# Patient Record
Sex: Male | Born: 1935
Health system: Southern US, Community
[De-identification: ages and names within clinical notes are randomized; demographics above are authoritative.]

## PROBLEM LIST (undated history)

## (undated) DIAGNOSIS — E7800 Pure hypercholesterolemia, unspecified: Secondary | ICD-10-CM

## (undated) DIAGNOSIS — I639 Cerebral infarction, unspecified: Secondary | ICD-10-CM

## (undated) DIAGNOSIS — R29898 Other symptoms and signs involving the musculoskeletal system: Secondary | ICD-10-CM

## (undated) DIAGNOSIS — E785 Hyperlipidemia, unspecified: Secondary | ICD-10-CM

## (undated) DIAGNOSIS — Z86010 Personal history of colonic polyps: Secondary | ICD-10-CM

## (undated) HISTORY — DX: Cerebral infarction, unspecified: I63.9

## (undated) HISTORY — DX: Pure hypercholesterolemia, unspecified: E78.00

## (undated) HISTORY — DX: Other symptoms and signs involving the musculoskeletal system: R29.898

## (undated) HISTORY — DX: Hyperlipidemia, unspecified: E78.5

## (undated) HISTORY — DX: Personal history of colonic polyps: Z86.010

## (undated) HISTORY — PX: SHOULDER SURGERY: SHX246

---

## 2001-09-13 ENCOUNTER — Encounter: Admission: RE | Admit: 2001-09-13 | Discharge: 2001-09-13 | Payer: Self-pay | Admitting: Family Medicine

## 2001-09-13 ENCOUNTER — Encounter: Payer: Self-pay | Admitting: Family Medicine

## 2001-09-15 ENCOUNTER — Encounter: Admission: RE | Admit: 2001-09-15 | Discharge: 2001-09-15 | Payer: Self-pay | Admitting: Family Medicine

## 2001-09-15 ENCOUNTER — Encounter: Payer: Self-pay | Admitting: Family Medicine

## 2004-02-08 ENCOUNTER — Ambulatory Visit (HOSPITAL_COMMUNITY): Admission: RE | Admit: 2004-02-08 | Discharge: 2004-02-08 | Payer: Self-pay | Admitting: Orthopedic Surgery

## 2004-02-08 ENCOUNTER — Ambulatory Visit (HOSPITAL_BASED_OUTPATIENT_CLINIC_OR_DEPARTMENT_OTHER): Admission: RE | Admit: 2004-02-08 | Discharge: 2004-02-08 | Payer: Self-pay | Admitting: Orthopedic Surgery

## 2005-05-18 ENCOUNTER — Encounter: Admission: RE | Admit: 2005-05-18 | Discharge: 2005-05-18 | Payer: Self-pay | Admitting: Family Medicine

## 2013-05-18 DIAGNOSIS — I639 Cerebral infarction, unspecified: Secondary | ICD-10-CM

## 2013-05-18 HISTORY — DX: Cerebral infarction, unspecified: I63.9

## 2013-05-27 ENCOUNTER — Other Ambulatory Visit: Payer: Self-pay | Admitting: Physician Assistant

## 2013-05-27 ENCOUNTER — Ambulatory Visit
Admission: RE | Admit: 2013-05-27 | Discharge: 2013-05-27 | Disposition: A | Discharge status: Home / Self Care | Payer: Medicare Other | Source: Ambulatory Visit | Attending: Physician Assistant | Admitting: Physician Assistant

## 2013-05-27 DIAGNOSIS — R531 Weakness: Secondary | ICD-10-CM

## 2013-06-04 ENCOUNTER — Other Ambulatory Visit: Payer: Self-pay | Admitting: Physician Assistant

## 2013-06-04 DIAGNOSIS — M5410 Radiculopathy, site unspecified: Secondary | ICD-10-CM

## 2013-06-13 ENCOUNTER — Other Ambulatory Visit: Payer: Medicare Other

## 2013-06-27 ENCOUNTER — Other Ambulatory Visit: Payer: Self-pay | Admitting: Neurosurgery

## 2013-06-27 DIAGNOSIS — R531 Weakness: Secondary | ICD-10-CM

## 2013-07-04 ENCOUNTER — Encounter (HOSPITAL_COMMUNITY): Payer: Self-pay | Admitting: Emergency Medicine

## 2013-07-04 ENCOUNTER — Observation Stay (HOSPITAL_COMMUNITY): Payer: Medicare Other

## 2013-07-04 ENCOUNTER — Emergency Department (HOSPITAL_COMMUNITY): Payer: Medicare Other

## 2013-07-04 ENCOUNTER — Observation Stay (HOSPITAL_COMMUNITY)
Admission: EM | Admit: 2013-07-04 | Discharge: 2013-07-06 | Disposition: A | Discharge status: Home / Self Care | Payer: Medicare Other | Attending: Internal Medicine | Admitting: Internal Medicine

## 2013-07-04 DIAGNOSIS — Z23 Encounter for immunization: Secondary | ICD-10-CM | POA: Insufficient documentation

## 2013-07-04 DIAGNOSIS — I639 Cerebral infarction, unspecified: Secondary | ICD-10-CM

## 2013-07-04 DIAGNOSIS — M47814 Spondylosis without myelopathy or radiculopathy, thoracic region: Secondary | ICD-10-CM | POA: Insufficient documentation

## 2013-07-04 DIAGNOSIS — I517 Cardiomegaly: Secondary | ICD-10-CM

## 2013-07-04 DIAGNOSIS — I10 Essential (primary) hypertension: Secondary | ICD-10-CM | POA: Insufficient documentation

## 2013-07-04 DIAGNOSIS — Z7982 Long term (current) use of aspirin: Secondary | ICD-10-CM | POA: Insufficient documentation

## 2013-07-04 DIAGNOSIS — M25519 Pain in unspecified shoulder: Secondary | ICD-10-CM | POA: Insufficient documentation

## 2013-07-04 DIAGNOSIS — J449 Chronic obstructive pulmonary disease, unspecified: Secondary | ICD-10-CM | POA: Insufficient documentation

## 2013-07-04 DIAGNOSIS — G8929 Other chronic pain: Secondary | ICD-10-CM | POA: Insufficient documentation

## 2013-07-04 DIAGNOSIS — J4489 Other specified chronic obstructive pulmonary disease: Secondary | ICD-10-CM | POA: Insufficient documentation

## 2013-07-04 DIAGNOSIS — E785 Hyperlipidemia, unspecified: Secondary | ICD-10-CM | POA: Insufficient documentation

## 2013-07-04 DIAGNOSIS — I6350 Cerebral infarction due to unspecified occlusion or stenosis of unspecified cerebral artery: Principal | ICD-10-CM | POA: Insufficient documentation

## 2013-07-04 DIAGNOSIS — M47812 Spondylosis without myelopathy or radiculopathy, cervical region: Secondary | ICD-10-CM | POA: Insufficient documentation

## 2013-07-04 DIAGNOSIS — Z87891 Personal history of nicotine dependence: Secondary | ICD-10-CM | POA: Insufficient documentation

## 2013-07-04 LAB — CBC
HCT: 44.7 % (ref 39.0–52.0)
HEMOGLOBIN: 15.3 g/dL (ref 13.0–17.0)
MCH: 33 pg (ref 26.0–34.0)
MCHC: 34.2 g/dL (ref 30.0–36.0)
MCV: 96.5 fL (ref 78.0–100.0)
PLATELETS: 180 10*3/uL (ref 150–400)
RBC: 4.63 MIL/uL (ref 4.22–5.81)
RDW: 13.4 % (ref 11.5–15.5)
WBC: 6.7 10*3/uL (ref 4.0–10.5)

## 2013-07-04 LAB — COMPREHENSIVE METABOLIC PANEL
ALK PHOS: 105 U/L (ref 39–117)
ALT: 18 U/L (ref 0–53)
AST: 17 U/L (ref 0–37)
Albumin: 3.6 g/dL (ref 3.5–5.2)
BILIRUBIN TOTAL: 0.4 mg/dL (ref 0.3–1.2)
BUN: 21 mg/dL (ref 6–23)
CHLORIDE: 103 meq/L (ref 96–112)
CO2: 26 mEq/L (ref 19–32)
Calcium: 9.3 mg/dL (ref 8.4–10.5)
Creatinine, Ser: 1.2 mg/dL (ref 0.50–1.35)
GFR calc Af Amer: 66 mL/min — ABNORMAL LOW (ref >90)
GFR calc non Af Amer: 57 mL/min — ABNORMAL LOW (ref >90)
Glucose, Bld: 94 mg/dL (ref 70–99)
POTASSIUM: 4.1 meq/L (ref 3.7–5.3)
SODIUM: 142 meq/L (ref 137–147)
TOTAL PROTEIN: 7.2 g/dL (ref 6.0–8.3)

## 2013-07-04 LAB — PROTIME-INR
INR: 1.05 (ref 0.00–1.49)
Prothrombin Time: 13.5 seconds (ref 11.6–15.2)

## 2013-07-04 LAB — DIFFERENTIAL
Basophils Absolute: 0 10*3/uL (ref 0.0–0.1)
Basophils Relative: 0 % (ref 0–1)
Eosinophils Absolute: 0.1 10*3/uL (ref 0.0–0.7)
Eosinophils Relative: 2 % (ref 0–5)
LYMPHS ABS: 1.6 10*3/uL (ref 0.7–4.0)
Lymphocytes Relative: 24 % (ref 12–46)
MONOS PCT: 12 % (ref 3–12)
Monocytes Absolute: 0.8 10*3/uL (ref 0.1–1.0)
NEUTROS ABS: 4.2 10*3/uL (ref 1.7–7.7)
NEUTROS PCT: 62 % (ref 43–77)

## 2013-07-04 LAB — I-STAT TROPONIN, ED: TROPONIN I, POC: 0 ng/mL (ref 0.00–0.08)

## 2013-07-04 LAB — APTT: APTT: 30 s (ref 24–37)

## 2013-07-04 MED ORDER — LORAZEPAM 2 MG/ML IJ SOLN
1.0000 mg | Freq: Once | INTRAMUSCULAR | Status: AC
  Administered 2013-07-05: 1 mg via INTRAVENOUS
  Filled 2013-07-04: qty 1

## 2013-07-04 MED ORDER — PERFLUTREN LIPID MICROSPHERE
1.0000 mL | INTRAVENOUS | Status: AC | PRN
  Filled 2013-07-04: qty 10

## 2013-07-04 MED ORDER — ENOXAPARIN SODIUM 40 MG/0.4ML ~~LOC~~ SOLN
40.0000 mg | SUBCUTANEOUS | Status: DC
  Administered 2013-07-04 – 2013-07-05 (×2): 40 mg via SUBCUTANEOUS
  Filled 2013-07-04 (×3): qty 0.4

## 2013-07-04 MED ORDER — ASPIRIN 325 MG PO TABS
325.0000 mg | ORAL_TABLET | Freq: Every day | ORAL | Status: DC
  Administered 2013-07-04 – 2013-07-06 (×3): 325 mg via ORAL
  Filled 2013-07-04 (×3): qty 1

## 2013-07-04 MED ORDER — PERFLUTREN LIPID MICROSPHERE
1.0000 mL | INTRAVENOUS | Status: AC | PRN
  Administered 2013-07-04: 2 mL via INTRAVENOUS
  Filled 2013-07-04: qty 10

## 2013-07-04 MED ORDER — ASPIRIN 300 MG RE SUPP
300.0000 mg | Freq: Every day | RECTAL | Status: DC
  Filled 2013-07-04 (×2): qty 1

## 2013-07-04 MED ORDER — ACETAMINOPHEN 325 MG PO TABS: 650.0000 mg | ORAL_TABLET | ORAL | Status: DC | PRN

## 2013-07-04 MED ORDER — SODIUM CHLORIDE 0.9 % IV SOLN
INTRAVENOUS | Status: DC
  Administered 2013-07-04: 500 mL via INTRAVENOUS

## 2013-07-04 MED ORDER — ACETAMINOPHEN 650 MG RE SUPP: 650.0000 mg | RECTAL | Status: DC | PRN

## 2013-07-04 MED ORDER — PNEUMOCOCCAL VAC POLYVALENT 25 MCG/0.5ML IJ INJ
0.5000 mL | INJECTION | INTRAMUSCULAR | Status: AC
  Administered 2013-07-06: 0.5 mL via INTRAMUSCULAR
  Filled 2013-07-04 (×2): qty 0.5

## 2013-07-04 MED ORDER — SENNOSIDES-DOCUSATE SODIUM 8.6-50 MG PO TABS
1.0000 | ORAL_TABLET | Freq: Every evening | ORAL | Status: DC | PRN
Start: 2013-07-04 — End: 2013-07-06

## 2013-07-04 NOTE — ED Notes (Signed)
Internal medicine MD at bedside for admission consult.

## 2013-07-04 NOTE — ED Notes (Signed)
Family at bedside. 

## 2013-07-04 NOTE — ED Notes (Signed)
Patient transported to CT 

## 2013-07-04 NOTE — Progress Notes (Signed)
Echocardiogram 2D Echocardiogram with Definity has been performed.  Ines Bloomer 07/04/2013, 4:34 PM

## 2013-07-04 NOTE — ED Notes (Signed)
BS 82 

## 2013-07-04 NOTE — ED Notes (Signed)
Pt has been having left sided weakness and loss of function in left hand since end of March. There have been a few episodes since but have resolved. Currently pt having no symptoms. sts was sent here by his doctor with positive MRI results saying acute infarct. MRI was done Wednesday.

## 2013-07-04 NOTE — ED Notes (Signed)
Patient back fro CT.

## 2013-07-04 NOTE — ED Notes (Signed)
The patient's wife said he started having numbness, tingling and could not use his fingers.  All this started on March 9th.  He went to his PCP and they gave him prednisone and he got better.  By the end of that week he got worse than before the 9th,  He could not pull his underwear up.  He went back, got an MRI and was schedule to have surgery.    Dr. Sheryle Spray advised them that his problems were not due to DJD they were du to changes in his brain.  He got a second MRI and before he could see Dr. Maxie Barb she sent him here to the ED to be evaluated due to "sub acute or acute infarcts and circulatory" problems.

## 2013-07-04 NOTE — Consult Note (Signed)
Referring Physician: Alvino Chapel    Chief Complaint: Left hand weakness since March 9  HPI:                                                                                                                                         Edward Maynard is an 80 y.o. male who has been having left hand weakness since March 9th. He went to see his primary care MD who gave him Prednisone.  He states over the week he took Prednisone his left hand slowly improved. After his last prednisone pill he noted a significant decline in his left hand strength.  Wife noted he could not grip and was clumsy with his left hand.  Over a period of a week his hand strength slightly improved but then worsened. He obtained MRI C-spine which was normal but he was referred to neurosurgery.  Neurosurgery ordered MRI brain which was obtained on Wednesday.  This showed bilateral Infarct in watershed area. He was to see Dr. Maxie Barb out patient neruology but Dr. Maxie Barb sent him to ED. Currently he shows no weakness in his left hand.   Date last known well: Date: 05/26/2013 Time last known well: Unable to determine tPA Given: No: out of window  No pertinent past medical history.  History reviewed. No pertinent past surgical history.  Family History  Problem Relation Age of Onset  . Hypertension Mother   . Hypertension Father    Social History:  reports that he has quit smoking. He does not have any smokeless tobacco history on file. He reports that he does not drink alcohol. His drug history is not on file.  Allergies: No Known Allergies  Medications:                                                                                                                           No current facility-administered medications for this encounter.   Current Outpatient Prescriptions  Medication Sig Dispense Refill  . aspirin EC 81 MG tablet Take 81 mg by mouth daily.      Marland Kitchen etodolac (LODINE) 400 MG tablet Take 400 mg by mouth daily as needed (knee  pain).         ROS:  History obtained from the patient  General ROS: negative for - chills, fatigue, fever, night sweats, weight gain or weight loss Psychological ROS: negative for - behavioral disorder, hallucinations, memory difficulties, mood swings or suicidal ideation Ophthalmic ROS: negative for - blurry vision, double vision, eye pain or loss of vision ENT ROS: negative for - epistaxis, nasal discharge, oral lesions, sore throat, tinnitus or vertigo Allergy and Immunology ROS: negative for - hives or itchy/watery eyes Hematological and Lymphatic ROS: negative for - bleeding problems, bruising or swollen lymph nodes Endocrine ROS: negative for - galactorrhea, hair pattern changes, polydipsia/polyuria or temperature intolerance Respiratory ROS: negative for - cough, hemoptysis, shortness of breath or wheezing Cardiovascular ROS: negative for - chest pain, dyspnea on exertion, edema or irregular heartbeat Gastrointestinal ROS: negative for - abdominal pain, diarrhea, hematemesis, nausea/vomiting or stool incontinence Genito-Urinary ROS: negative for - dysuria, hematuria, incontinence or urinary frequency/urgency Musculoskeletal ROS: negative for - joint swelling or muscular weakness Neurological ROS: as noted in HPI Dermatological ROS: negative for rash and skin lesion changes  Neurologic Examination:                                                                                                      Blood pressure 157/86, pulse 76, temperature 98 F (36.7 C), temperature source Oral, resp. rate 20, height 6' (1.829 m), weight 93.441 kg (206 lb), SpO2 96.00%.   Mental Status: Alert, oriented, thought content appropriate.  Speech fluent without evidence of aphasia.  Able to follow 3 step commands without difficulty. Cranial Nerves: II: Discs flat  bilaterally; Visual fields grossly normal, pupils equal, round, reactive to light and accommodation III,IV, VI: ptosis not present, extra-ocular motions intact bilaterally V,VII: smile symmetric, facial light touch sensation normal bilaterally VIII: hearing normal bilaterally IX,X: gag reflex present XI: bilateral shoulder shrug XII: midline tongue extension without atrophy or fasciculations  Motor: Right : Upper extremity   5/5    Left:     Upper extremity   5/5  Lower extremity   5/5     Lower extremity   5/5 Tone and bulk:normal tone throughout; no atrophy noted Sensory: Pinprick and light touch intact throughout, bilaterally Deep Tendon Reflexes:  Right: Upper Extremity   Left: Upper extremity   biceps (C-5 to C-6) 2/4   biceps (C-5 to C-6) 2/4 tricep (C7) 2/4    triceps (C7) 2/4 Brachioradialis (C6) 2/4  Brachioradialis (C6) 2/4  Lower Extremity Lower Extremity  quadriceps (L-2 to L-4) 2/4   quadriceps (L-2 to L-4) 2/4 Achilles (S1) 1/4   Achilles (S1) 1/4  Plantars: Right: downgoing   Left: downgoing Cerebellar: normal finger-to-nose,  normal heel-to-shin test Gait: not tested due to multiple leads.  CV: pulses palpable throughout    Lab Results: Basic Metabolic Panel:  Recent Labs Lab 07/04/13 1146  NA 142  K 4.1  CL 103  CO2 26  GLUCOSE 94  BUN 21  CREATININE 1.20  CALCIUM 9.3    Liver Function Tests:  Recent Labs Lab 07/04/13 1146  AST 17  ALT 18  ALKPHOS 105  BILITOT 0.4  PROT 7.2  ALBUMIN 3.6   No results found for this basename: LIPASE, AMYLASE,  in the last 168 hours No results found for this basename: AMMONIA,  in the last 168 hours  CBC:  Recent Labs Lab 07/04/13 1146  WBC 6.7  NEUTROABS 4.2  HGB 15.3  HCT 44.7  MCV 96.5  PLT 180    Cardiac Enzymes: No results found for this basename: CKTOTAL, CKMB, CKMBINDEX, TROPONINI,  in the last 168 hours  Lipid Panel: No results found for this basename: CHOL, TRIG, HDL, CHOLHDL,  VLDL, LDLCALC,  in the last 168 hours  CBG: No results found for this basename: GLUCAP,  in the last 168 hours  Microbiology: No results found for this or any previous visit.  Coagulation Studies:  Recent Labs  07/04/13 1146  LABPROT 13.5  INR 1.05    Imaging: No results found.     Assessment and plan discussed with with attending physician and they are in agreement.    Etta Quill PA-C [facility] [phone]  07/04/2013, 1:14 PM  I have seen and evaluated the patient. I have reviewed the above note and made appropriate changes.    Assessment: 80 y.o. male with left hand weakness that has waxed and waned since March 9th.  MRI shows bilateral infarcts. Patient is on ASA but admits he does not take this on a regular basis.   Stroke Risk Factors - none    Plan: 1. HgbA1c, fasting lipid panel 2. MRA  of the brain without contrast 3. PT consult, OT consult, Speech consult 4. Echocardiogram 5. Carotid dopplers 6. Prophylactic therapy-Antiplatelet med: Aspirin - dose 81 mg daily 7. Risk  Factor modification  Roland Rack, MD [facility] [phone]  If 7pm- 7am, please page neurology on call as listed in [place].

## 2013-07-04 NOTE — ED Provider Notes (Signed)
CSN: [id number]     Arrival date & time 07/04/13  1004 History   First MD Initiated Contact with Patient 07/04/13 1128     Chief Complaint  Patient presents with  . Cerebrovascular Accident     (Consider location/radiation/quality/duration/timing/severity/associated sxs/prior Treatment) Patient is a 80 y.o. male presenting with Acute Neurological Problem. The history is provided by the patient.  Cerebrovascular Accident This is a new problem. Pertinent negatives include no chest pain, no abdominal pain, no headaches and no shortness of breath.   patient presents with an MRI showing a stroke. He has had some left hand weakness, worse on left hand. It is currently gone. It began around 5 weeks ago. He has had an MRI of the cervical spine and see neurosurgery. Neurosurgery ordered a brain MRI after telling him that this was not coming from the spine. Patient has had trouble with his golf game. No headaches. No numbness or weakness in the legs. No chest pain. No history of strokes. He has had some dizziness occasionally when he stands. History reviewed. No pertinent past medical history. Past Surgical History  Procedure Laterality Date  . Shoulder surgery Bilateral    Family History  Problem Relation Age of Onset  . Hypertension Mother   . Hypertension Father   . COPD Brother    History  Substance Use Topics  . Smoking status: Former Research scientist (life sciences)  . Smokeless tobacco: Not on file  . Alcohol Use: No    Review of Systems  Constitutional: Negative for activity change and appetite change.  Eyes: Negative for pain.  Respiratory: Negative for chest tightness and shortness of breath.   Cardiovascular: Negative for chest pain and leg swelling.  Gastrointestinal: Negative for nausea, vomiting, abdominal pain and diarrhea.  Genitourinary: Negative for flank pain.  Musculoskeletal: Negative for back pain and neck stiffness.  Skin: Negative for rash.  Neurological: Positive for weakness. Negative  for numbness and headaches.  Psychiatric/Behavioral: Negative for behavioral problems.   and     Allergies  Review of patient's allergies indicates no known allergies.  Home Medications   Prior to Admission medications   Medication Sig Start Date End Date Taking? Authorizing Provider  aspirin EC 81 MG tablet Take 81 mg by mouth daily.   Yes Historical Provider, MD  etodolac (LODINE) 400 MG tablet Take 400 mg by mouth daily as needed (knee pain).   Yes Historical Provider, MD   BP 132/69  Pulse 67  Temp(Src) 97.5 F (36.4 C) (Oral)  Resp 18  Ht 6' (1.829 m)  Wt 206 lb (93.441 kg)  BMI 27.93 kg/m2  SpO2 96% Physical Exam  Nursing note and vitals reviewed. Constitutional: He is oriented to person, place, and time. He appears well-developed and well-nourished.  HENT:  Head: Normocephalic and atraumatic.  Eyes: EOM are normal. Pupils are equal, round, and reactive to light.  Neck: Normal range of motion. Neck supple.  Cardiovascular: Normal rate, regular rhythm and normal heart sounds.   No murmur heard. Pulmonary/Chest: Effort normal and breath sounds normal.  Abdominal: Soft. Bowel sounds are normal. He exhibits no distension and no mass. There is no tenderness. There is no rebound and no guarding.  Musculoskeletal: Normal range of motion. He exhibits no edema.  Neurological: He is alert and oriented to person, place, and time. No cranial nerve deficit.  Good grip strength bilaterally. Finger-nose intact bilaterally. Sensation intact over bilateral upper and lower extremities. Face is symmetric.  Skin: Skin is warm and dry.  Psychiatric:  He has a normal mood and affect.    ED Course  Procedures (including critical care time) Labs Review Labs Reviewed  COMPREHENSIVE METABOLIC PANEL - Abnormal; Notable for the following:    GFR calc non Af Amer 57 (*)    GFR calc Af Amer 66 (*)    All other components within normal limits  HEMOGLOBIN A1C - Abnormal; Notable for the  following:    Hemoglobin A1C 5.8 (*)    Mean Plasma Glucose 120 (*)    All other components within normal limits  LIPID PANEL - Abnormal; Notable for the following:    Triglycerides 194 (*)    HDL 36 (*)    LDL Cholesterol 115 (*)    All other components within normal limits  PROTIME-INR  APTT  CBC  DIFFERENTIAL  CBG MONITORING, ED  Randolm Idol, ED    Imaging Review Dg Chest 2 View  07/04/2013   CLINICAL DATA:  Stroke.  EXAM: CHEST  2 VIEW  COMPARISON:  None  FINDINGS: Elevated right hemidiaphragm. Mild passive atelectasis along the right hemidiaphragm.  Thoracic spondylosis. Cardiac and mediastinal margins appear normal. No pleural effusion identified.  IMPRESSION: 1. Mildly elevated right hemidiaphragm with associated passive atelectasis. 2. Thoracic spondylosis.   Electronically Signed   By: Sherryl Barters M.D.   On: 07/04/2013 18:26   Mr Jodene Nam Head/brain Wo Cm  07/05/2013   CLINICAL DATA:  80 year old male with acute and subacute infarcts discovered on 07/02/2013 brain MRI. Upper extremity numbness and weakness. Initial encounter.  EXAM: MRA HEAD WITHOUT CONTRAST  TECHNIQUE: Angiographic images of the Circle of Willis were obtained using MRA technique without intravenous contrast.  COMPARISON:  Brain MRI 07/02/2013.  FINDINGS: Antegrade flow in the posterior circulation. Dominant distal right vertebral artery. Normal left PICA origin. Patent vertebrobasilar junction. No basilar stenosis. AICA origins are patent. SCA and left PCA origins are normal. Fetal type right PCA origin. Left posterior communicating artery diminutive or absent. Normal bilateral PCA branches.  Antegrade flow in both ICA siphons. No ICA stenosis. Ophthalmic artery and right posterior communicating artery origins are within normal limits. Patent carotid termini.  Normal MCA and ACA origins. Diminutive or absent anterior communicating artery. No M1 segment stenosis. Motion artifact mildly degrading detail of the  bilateral MCA and ACA branches.Allowing for motion visualized bilateral MCA and ACA branches are within normal limits.  IMPRESSION: Negative intracranial MRA, allowing for mild motion artifact.   Electronically Signed   By: Lars Pinks M.D.   On: 07/05/2013 12:51     EKG Interpretation   Date/Time:  Friday July 04 2013 11:51:31 EDT Ventricular Rate:  76 PR Interval:  212 QRS Duration: 87 QT Interval:  387 QTC Calculation: 435 R Axis:   -7 Text Interpretation:  Sinus rhythm Borderline prolonged PR interval  Abnormal R-wave progression, early transition Baseline wander in lead(s)  V3 Confirmed by Alvino Chapel  MD, Ovid Curd [phone]) on 07/04/2013 1:15:44 PM      MDM   Final diagnoses:  Stroke    Patient with stroke. MRI done as an outpatient showed acute and subacute strokes. Symptoms are no longer present. After being seen by neurology patient be admitted to internal medicine for    [facility] R. Alvino Chapel, MD 07/05/13 1736

## 2013-07-04 NOTE — ED Notes (Signed)
MD at bedside, Neurology.

## 2013-07-04 NOTE — H&P (Signed)
History and Physical  Edward Maynard Edward Maynard DOB: 03/17/1934 DOA: 07/04/2013  Referring physician: EDP PCP: Simona Huh, MD  Outpatient Specialists:  1. Orthopedics  Chief Complaint: Left upper extremity weakness  HPI: Edward Maynard is a 80 y.o. male with no significant past medical history, history of bilateral shoulder surgery, physically active and plays golf, referred by neurologists office for evaluation of left upper extremity weakness and MRI which shows stroke. On 05/26/2013, patient experienced an onset of left upper extremity weakness, especially of his hand without any tingling, numbness, worsening neck pain or any other complaints. He was unable to use his left hand to do his activities of daily living. He was seen by his PCP and treated with a course of steroids for presumed cervical disc disease. His symptoms gradually improved over the next week. Soon after he stopped his prednisone, he again had weakness of left upper extremity on 06/02/2013 which was worse than the previous episode. He was unable to even put on his cloths or hold anything. With this he had some unsteadiness of gait and generally feeling unwell. No headache, slurred speech or facial asymmetry. PCPs office had patient MRI of cervical spine over the next several days and then he was referred to neurosurgery. Dr.Botero, neurosurgery evaluated him on 07/02/2013 and suspected his left upper extremity weakness was secondary to stroke rather than a cervical spine or neurosurgical issue. MRI brain was requested which revealed bilateral infarction watershed area. Patient had an appointment to see Dr. Maxie Barb, neurology on 07/07/2013. However patient was called by the neurologist office today and advised to come to the hospital for further evaluation. In the interim, patient states that his left upper extremity strength has returned almost to baseline.   Review of Systems: All systems reviewed and apart from history  of presenting illness, are negative. Chronic right shoulder pain for which she is awaiting to see the orthopedic M.D. again. This pain is worse on lying on that side.  History reviewed. No pertinent past medical history. Past Surgical History  Procedure Laterality Date  . Shoulder surgery Bilateral    Social History:  reports that he has quit smoking. He does not have any smokeless tobacco history on file. He reports that he does not drink alcohol or use illicit drugs. Married. Independent of activities of daily living.  No Known Allergies  Family History  Problem Relation Age of Onset  . Hypertension Mother   . Hypertension Father   . COPD Brother     Prior to Admission medications   Medication Sig Start Date End Date Taking? Authorizing Provider  aspirin EC 81 MG tablet Take 81 mg by mouth daily.   Yes Historical Provider, MD  etodolac (LODINE) 400 MG tablet Take 400 mg by mouth daily as needed (knee pain).   Yes Historical Provider, MD   Physical Exam: Filed Vitals:   07/04/13 1230 07/04/13 1245 07/04/13 1300 07/04/13 1349  BP: 142/69 151/80 157/86 156/90  Pulse: 68 73 76 77  Temp:      TempSrc:      Resp: 21 21 20 18   Height:      Weight:      SpO2: 96% 95% 96% 95%     General exam: Moderately built and nourished pleasant elderly patient, lying comfortably supine on the gurney in no obvious distress.  Head, eyes and ENT: Nontraumatic and normocephalic. Pupils equally reacting to light and accommodation. Oral mucosa moist.  Neck: Supple. No JVD, carotid bruit  or thyromegaly.  Lymphatics: No lymphadenopathy.  Respiratory system: Clear to auscultation. No increased work of breathing.  Cardiovascular system: S1 and S2 heard, RRR. No JVD, murmurs, gallops, clicks or pedal edema.  Gastrointestinal system: Abdomen is nondistended, soft and nontender. Normal bowel sounds heard. No organomegaly or masses appreciated.  Central nervous system: Alert and oriented. No focal  neurological deficits.  Extremities: Symmetric 5 x 5 power. Peripheral pulses symmetrically felt.   Skin: No rashes or acute findings.  Musculoskeletal system: Negative exam.  Psychiatry: Pleasant and cooperative.   Labs on Admission:  Basic Metabolic Panel:  Recent Labs Lab 07/04/13 1146  NA 142  K 4.1  CL 103  CO2 26  GLUCOSE 94  BUN 21  CREATININE 1.20  CALCIUM 9.3   Liver Function Tests:  Recent Labs Lab 07/04/13 1146  AST 17  ALT 18  ALKPHOS 105  BILITOT 0.4  PROT 7.2  ALBUMIN 3.6   No results found for this basename: LIPASE, AMYLASE,  in the last 168 hours No results found for this basename: AMMONIA,  in the last 168 hours CBC:  Recent Labs Lab 07/04/13 1146  WBC 6.7  NEUTROABS 4.2  HGB 15.3  HCT 44.7  MCV 96.5  PLT 180   Cardiac Enzymes: No results found for this basename: CKTOTAL, CKMB, CKMBINDEX, TROPONINI,  in the last 168 hours  BNP (last 3 results) No results found for this basename: PROBNP,  in the last 8760 hours CBG: No results found for this basename: GLUCAP,  in the last 168 hours  Radiological Exams on Admission: No results found.  EKG: Independently reviewed. Sinus rhythm without acute findings.  Assessment/Plan Principal Problem:   CVA (cerebral infarction)   80 year old male patient with no known past medical history, presents with 2 episodes of left upper extremity weakness since 05/26/2013. MRI brain shows bilateral infarcts in the watershed region. Patient took aspirin 81 mg infrequently.  1. CVA: Admit to telemetry.  Complete stroke workup-MRA brain without contrast, 2-D echo, carotid Dopplers, hemoglobin A1c, fasting lipids. PT, OT and ST consultation. Aspirin 325 mg daily for secondary stroke prevention. Neurology consultation appreciated.     Code Status:  Full  Family Communication:  discussed with spouse at bedside.   Disposition Plan:  home possibly 07/05/13 after stroke workup and if it isunremarkable.    Time spent:  55 minutes  Modena Jansky, MD, FACP, [facility]. [facility] Pager [phone]  If 7PM-7AM, please contact night-coverage [email or web address] Password [email or web address] 07/04/2013, 2:15 PM

## 2013-07-05 ENCOUNTER — Observation Stay (HOSPITAL_COMMUNITY): Payer: Medicare Other

## 2013-07-05 DIAGNOSIS — E785 Hyperlipidemia, unspecified: Secondary | ICD-10-CM

## 2013-07-05 LAB — LIPID PANEL
CHOL/HDL RATIO: 5.3 ratio
Cholesterol: 190 mg/dL (ref 0–200)
HDL: 36 mg/dL — ABNORMAL LOW (ref >39)
LDL CALC: 115 mg/dL — AB (ref 0–99)
TRIGLYCERIDES: 194 mg/dL — AB (ref <150)
VLDL: 39 mg/dL (ref 0–40)

## 2013-07-05 LAB — HEMOGLOBIN A1C
Hgb A1c MFr Bld: 5.8 % — ABNORMAL HIGH (ref <5.7)
Mean Plasma Glucose: 120 mg/dL — ABNORMAL HIGH (ref <117)

## 2013-07-05 MED ORDER — SIMVASTATIN 20 MG PO TABS
20.0000 mg | ORAL_TABLET | Freq: Every day | ORAL | Status: DC
  Administered 2013-07-05: 20 mg via ORAL
  Filled 2013-07-05 (×2): qty 1

## 2013-07-05 NOTE — Progress Notes (Signed)
PT Cancellation of evaluation Note  Patient Details Name: Edward Maynard MRN: [id number] DOB: [birth year]   Cancelled Treatment:    Reason Eval/Treat Not Completed: OT evaluated pt and screened for PT needs, no needs identified, will sign off from acute PT   Tessie Fass Ry Moody 07/05/2013, 12:44 PM 07/05/2013  Donnella Sham, [place] [phone]  (pager)

## 2013-07-05 NOTE — Progress Notes (Signed)
VASCULAR LAB PRELIMINARY  PRELIMINARY  PRELIMINARY  PRELIMINARY  Carotid Dopplers completed.    Preliminary report:  1-39% ICA stenosis.  Vertebral artery flow is antegrade.  Iantha Fallen, RVT 07/05/2013, 3:45 PM

## 2013-07-05 NOTE — Progress Notes (Signed)
[facility] PROGRESS NOTE  Edward Maynard:[id number] DOB: Dec 13, 1933 DOA: 07/04/2013 PCP: Edward Huh, MD  Assessment/Plan: 1. CVA 1. MRA head unremarkable 2. Carotid dopplers still pending 3. 2D echo unremarkable 4. No needs per OT 5. On statin and ASA per Neurology recs 2. HTN 1. Stable 2. Cont home meds 3. COPD 1. Stable on minimal O2 support 4. DVT prophylaxis 1. lovenox subQ  Code Status: Full Family Communication: Pt in room (indicate person spoken with, relationship, and if by phone, the number) Disposition Plan: Pending   Consultants:  Neurology  Procedures:    Antibiotics:    HPI/Subjective: No complaints. Eager to go home  Objective: Filed Vitals:   07/04/13 2300 07/05/13 0100 07/05/13 0223 07/05/13 0945  BP: 145/84 172/81 155/86 141/71  Pulse: 69 66 64 70  Temp: 98.6 F (37 C) 98.5 F (36.9 C)  97.3 F (36.3 C)  TempSrc: Oral Oral  Oral  Resp: 16 16  18   Height:      Weight:      SpO2: 97% 95% 96% 97%    Intake/Output Summary (Last 24 hours) at 07/05/13 1208 Last data filed at 07/04/13 1350  Gross per 24 hour  Intake      0 ml  Output    350 ml  Net   -350 ml   Filed Weights   07/04/13 1113 07/04/13 1153  Weight: 93.441 kg (206 lb) 93.441 kg (206 lb)    Exam:   General:  Awake, in nad  Cardiovascular: regular, s1, s2  Respiratory: normal resp effort, no wheezing  Abdomen: soft, nondistended  Musculoskeletal: perfused, no clubbing   Data Reviewed: Basic Metabolic Panel:  Recent Labs Lab 07/04/13 1146  NA 142  K 4.1  CL 103  CO2 26  GLUCOSE 94  BUN 21  CREATININE 1.20  CALCIUM 9.3   Liver Function Tests:  Recent Labs Lab 07/04/13 1146  AST 17  ALT 18  ALKPHOS 105  BILITOT 0.4  PROT 7.2  ALBUMIN 3.6   No results found for this basename: LIPASE, AMYLASE,  in the last 168 hours No results found for this basename: AMMONIA,  in the last 168 hours CBC:  Recent Labs Lab 07/04/13 1146   WBC 6.7  NEUTROABS 4.2  HGB 15.3  HCT 44.7  MCV 96.5  PLT 180   Cardiac Enzymes: No results found for this basename: CKTOTAL, CKMB, CKMBINDEX, TROPONINI,  in the last 168 hours BNP (last 3 results) No results found for this basename: PROBNP,  in the last 8760 hours CBG: No results found for this basename: GLUCAP,  in the last 168 hours  No results found for this or any previous visit (from the past 240 hour(s)).   Studies: Dg Chest 2 View  07/04/2013   CLINICAL DATA:  Stroke.  EXAM: CHEST  2 VIEW  COMPARISON:  None  FINDINGS: Elevated right hemidiaphragm. Mild passive atelectasis along the right hemidiaphragm.  Thoracic spondylosis. Cardiac and mediastinal margins appear normal. No pleural effusion identified.  IMPRESSION: 1. Mildly elevated right hemidiaphragm with associated passive atelectasis. 2. Thoracic spondylosis.   Electronically Signed   By: Sherryl Barters M.D.   On: 07/04/2013 18:26    Scheduled Meds: . aspirin  300 mg Rectal Daily   Or  . aspirin  325 mg Oral Daily  . enoxaparin (LOVENOX) injection  40 mg Subcutaneous Q24H  . pneumococcal 23 valent vaccine  0.5 mL Intramuscular Tomorrow-1000  . simvastatin  20 mg Oral q1800  Continuous Infusions: . sodium chloride 500 mL (07/04/13 1611)    Principal Problem:   CVA (cerebral infarction)  Time spent: 72min  Edward Maynard  [facility] Pager [phone]. If 7PM-7AM, please contact night-coverage at [email or web address], password [facility] 07/05/2013, 12:08 PM  LOS: 1 day

## 2013-07-05 NOTE — Progress Notes (Signed)
Stroke Team Progress Note  HISTORY Edward Maynard is a 80 y.o. male who had been having left hand weakness since March 9th. He went to see his primary care MD who gave him Prednisone. He stated over the week he took Prednisone his left hand slowly improved. After his last prednisone pill he noted a significant decline in his left hand strength. Wife noted he could not grip and was clumsy with his left hand. Over a period of a week his hand strength slightly improved but then worsened. He obtained MRI C-spine which was normal but he was referred to neurosurgery. Neurosurgery ( Dr. Joya Salm ) ordered MRI brain which was obtained on Wednesday. This showed bilateral Infarcts in watershed area. He was to see Dr. Maxie Barb out patient neruology but Dr. Maxie Barb sent him to ED. Currently he shows no weakness in his left hand.   Date last known well: Date: 05/26/2013  Time last known well: Unable to determine  tPA Given: No: out of window   SUBJECTIVE Multiple family members present. The patient's wife reports that the patient also had some intermittent balance problems.  OBJECTIVE Most recent Vital Signs: Filed Vitals:   07/04/13 2105 07/04/13 2300 07/05/13 0100 07/05/13 0223  BP: 148/87 145/84 172/81 155/86  Pulse: 64 69 66 64  Temp: 98 F (36.7 C) 98.6 F (37 C) 98.5 F (36.9 C)   TempSrc: Oral Oral Oral   Resp: 16 16 16    Height:      Weight:      SpO2: 93% 97% 95% 96%   CBG (last 3)  No results found for this basename: GLUCAP,  in the last 72 hours  IV Fluid Intake:   . sodium chloride 500 mL (07/04/13 1611)    MEDICATIONS  . aspirin  300 mg Rectal Daily   Or  . aspirin  325 mg Oral Daily  . enoxaparin (LOVENOX) injection  40 mg Subcutaneous Q24H  . LORazepam  1 mg Intravenous Once  . pneumococcal 23 valent vaccine  0.5 mL Intramuscular Tomorrow-1000   PRN:  acetaminophen, acetaminophen, senna-docusate  Diet:  General thin liquids Activity:  As tolerated DVT Prophylaxis:   Lovenox  CLINICALLY SIGNIFICANT STUDIES Basic Metabolic Panel:  Recent Labs Lab 07/04/13 1146  NA 142  K 4.1  CL 103  CO2 26  GLUCOSE 94  BUN 21  CREATININE 1.20  CALCIUM 9.3   Liver Function Tests:  Recent Labs Lab 07/04/13 1146  AST 17  ALT 18  ALKPHOS 105  BILITOT 0.4  PROT 7.2  ALBUMIN 3.6   CBC:  Recent Labs Lab 07/04/13 1146  WBC 6.7  NEUTROABS 4.2  HGB 15.3  HCT 44.7  MCV 96.5  PLT 180   Coagulation:  Recent Labs Lab 07/04/13 1146  LABPROT 13.5  INR 1.05   Cardiac Enzymes: No results found for this basename: CKTOTAL, CKMB, CKMBINDEX, TROPONINI,  in the last 168 hours Urinalysis: No results found for this basename: COLORURINE, APPERANCEUR, LABSPEC, PHURINE, GLUCOSEU, HGBUR, BILIRUBINUR, KETONESUR, PROTEINUR, UROBILINOGEN, NITRITE, LEUKOCYTESUR,  in the last 168 hours Lipid Panel    Component Value Date/Time   CHOL 190 07/05/2013 0530   TRIG 194* 07/05/2013 0530   HDL 36* 07/05/2013 0530   CHOLHDL 5.3 07/05/2013 0530   VLDL 39 07/05/2013 0530   LDLCALC 115* 07/05/2013 0530   HgbA1C  No results found for this basename: HGBA1C    Urine Drug Screen:   No results found for this basename: labopia, cocainscrnur, labbenz, amphetmu, thcu,  labbarb    Alcohol Level: No results found for this basename: ETH,  in the last 168 hours  Dg Chest 2 View 07/04/2013    1. Mildly elevated right hemidiaphragm with associated passive atelectasis. 2. Thoracic spondylosis.     CT of the brain    MRI / MRA of the brain  Pending  MRI cervical spine - Pending    2D Echocardiogram -  ejection fraction 60-65%. No cardiac source of emboli identified.  Carotid Doppler  Pending   EKG  sinus rhythm rate 76 beats per minute. For complete results please see formal report.   Therapy Recommendations pending  Physical Exam    Mental Status:  Alert, oriented, thought content appropriate. Speech fluent without evidence of aphasia. Able to follow 3 step commands without  difficulty.  Cranial Nerves:  II: Discs flat bilaterally; Visual fields grossly normal, pupils equal, round, reactive to light and accommodation  III,IV, VI: ptosis not present, extra-ocular motions intact bilaterally  V,VII: smile symmetric, facial light touch sensation normal bilaterally  VIII: hearing normal bilaterally  IX,X: gag reflex present  XI: bilateral shoulder shrug  XII: midline tongue extension without atrophy or fasciculations  Motor:  Right : Upper extremity 5/5 Left: Upper extremity 5/5  Lower extremity 5/5 Lower extremity 5/5  Tone and bulk:normal tone throughout; no atrophy noted  Sensory: Pinprick and light touch intact throughout, bilaterally  Deep Tendon Reflexes:  Right: Upper Extremity Left: Upper extremity  biceps (C-5 to C-6) 2/4 biceps (C-5 to C-6) 2/4  tricep (C7) 2/4 triceps (C7) 2/4  Brachioradialis (C6) 2/4 Brachioradialis (C6) 2/4  Lower Extremity Lower Extremity  quadriceps (L-2 to L-4) 2/4 quadriceps (L-2 to L-4) 2/4  Achilles (S1) 1/4 Achilles (S1) 1/4  Plantars:  Right: downgoing Left: downgoing  Cerebellar:  normal finger-to-nose, normal heel-to-shin test  Gait: not tested    ASSESSMENT Edward Maynard is a 80 y.o. male presenting with a history of left hand weakness  x several weeks and recent MRI consistent with bilateral  Subacute infarcts in a watershed area. TPA was not administered as the patient was out of the window for treatment and his deficits had resolved.  MRI/MRA here is pending. On aspirin 81 mg orally every day prior to admission. Now on aspirin 325 mg orally every day for secondary stroke prevention. Patient with resultant resolution of deficits. Stroke work up underway.   Hyperlipidemia  - Cholesterol 190 ; LDL 115  History of degenerative spondylosis of the cervical spine.   Hospital day # 1  TREATMENT/PLAN  Continue aspirin 325 mg orally every day for secondary stroke prevention.  Await MRI/MRA, hemoglobin A1c,  carotid Dopplers, and telemetry monotoring  Await therapy evaluations  Add low dose statin   Mikey Bussing PA-C [facility] Pager [phone] 07/05/2013, 9:13 AM  I have personally obtained a history, examined the patient, evaluated imaging results, and formulated the assessment and plan of care. I agree with the above.  Antony Contras, MD  To contact Stroke Continuity provider, please refer to [email or web address] After hours, contact General Neurology

## 2013-07-05 NOTE — Progress Notes (Signed)
UR Completed Sheena Simonis Graves-Bigelow, RN,BSN [phone]  

## 2013-07-05 NOTE — Evaluation (Signed)
Occupational Therapy Evaluation Patient Details Name: Edward Maynard MRN: [id number] DOB: 18-Feb-1934 Today's Date: 07/05/2013    History of Present Illness Edward Maynard is a 80 y.o. male who had been having left hand weakness since March 9th. He went to see his primary care MD who gave him Prednisone. He stated over the week he took Prednisone his left hand slowly improved. After his last prednisone pill he noted a significant decline in his left hand strength. Wife noted he could not grip and was clumsy with his left hand. Over a period of a week his hand strength slightly improved but then worsened. He obtained MRI C-spine which was normal but he was referred to neurosurgery. Neurosurgery ( Dr. Joya Maynard ) ordered MRI brain which was obtained on Wednesday. This showed bilateral Infarcts in watershed area. He was to see Dr. Maxie Maynard out patient neruology but Dr. Maxie Maynard sent him to ED. Currently he shows no weakness in his left hand.    Clinical Impression   Pt admitted with above.  Pt currently is independent with functional mobility and ADLs.  Wife does report that she feels that she has noticed a slight change in his driving over past month; OT recommended pt get clearance from MD before he resumes driving after d/c.  Educated pt and wife on stroke signs/symptoms.  Pt does not need any further OT services.  Wife states she will be with pt 24/7 following d/c.  Will sign off. If pt experiences any change in status or decline in function, please re-order as needed.     Follow Up Recommendations  No OT follow up    Equipment Recommendations  None recommended by OT    Recommendations for Other Services       Precautions / Restrictions        Mobility Bed Mobility                  Transfers Overall transfer level: Independent                    Balance                                 Standardized Balance Assessment Standardized Balance Assessment : Dynamic Gait  Index   Dynamic Gait Index Level Surface: Normal Change in Gait Speed: Normal Gait with Horizontal Head Turns: Mild Impairment Gait with Vertical Head Turns: Normal Gait and Pivot Turn: Normal Step Over Obstacle: Normal Step Around Obstacles: Mild Impairment Steps: Normal Total Score: 22      ADL Overall ADL's : Independent                                       General ADL Comments: Pt donned bil shoes and tied laces without any difficulty.  Ambulated throughout unit independently.  Pt able to perform tub transfer independently and pick items up off floor.  Pt with slight list toward left when turning head but no LOB or bumping into objects.  Pt's wife states that over past month she has noticed several deficits that quickly resolved such as pt c/o double vision one day and pt seeming to have increased difficulty understanding conversation/questions.  Educated pt and wife on need to come to hospital any time he experiences a sudden change in function or experiences any of the  strokes signs/symptoms. Also recommended pt by cleared by MD before resuming driving.      Vision                     Perception     Praxis      Pertinent Vitals/Pain No c/o pain     Hand Dominance Right   Extremity/Trunk Assessment Upper Extremity Assessment Upper Extremity Assessment: LUE deficits/detail LUE Deficits / Details: No strength deficits noted. LUE Coordination:  (very slight gross motor deficits but [facility])           Communication Communication Communication: No difficulties   Cognition Arousal/Alertness: Awake/alert Behavior During Therapy: WFL for tasks assessed/performed Overall Cognitive Status: Within Functional Limits for tasks assessed                     General Comments       Exercises       Shoulder Instructions      Home Living Family/patient expects to be discharged to:: Private residence Living Arrangements: Spouse/significant  other Available Help at Discharge: Family;Available 24 hours/day Type of Home: House             Bathroom Shower/Tub: Teacher, early years/pre: Standard                Prior Functioning/Environment Level of Independence: Independent        Comments: Pt plays golf frequently and 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    OT Diagnosis:     OT Problem List:     OT Treatment/Interventions:      OT Goals(Current goals can be found in the care plan section)    OT Frequency:     Barriers to D/C:            Co-evaluation              End of Session Equipment Utilized During Treatment: Gait belt Nurse Communication: Mobility status  Activity Tolerance: Patient tolerated treatment well Patient left: in chair;with call bell/phone within reach;with family/visitor present   Time: 0935-1002 OT Time Calculation (min): 27 min Charges:  OT General Charges $OT Visit: 1 Procedure OT Evaluation $Initial OT Evaluation Tier I: 1 Procedure OT Treatments $Self Care/Home Management : 23-37 mins G-Codes: OT G-codes **NOT FOR INPATIENT CLASS** Functional Assessment Tool Used: clinical judgement Functional Limitation: Self care Self Care Current Status (C9470): 0 percent impaired, limited or restricted Self Care Goal Status (J6283): 0 percent impaired, limited or restricted Self Care Discharge Status (M6294): 0 percent impaired, limited or restricted  Edward Maynard 07/05/2013, 10:20 AM 07/05/2013 Edward Maynard OTR/L Pager [phone] Office [phone]

## 2013-07-06 MED ORDER — SIMVASTATIN 20 MG PO TABS: 20.0000 mg | ORAL_TABLET | Freq: Every day | ORAL | Status: DC

## 2013-07-06 NOTE — Progress Notes (Signed)
Stroke Team Progress Note  HISTORY BRNADON Maynard is a 80 y.o. male who had been having left hand weakness since March 9th. He went to see his primary care MD who gave him Prednisone. He stated over the week he took Prednisone his left hand slowly improved. After his last prednisone pill he noted a significant decline in his left hand strength. Wife noted he could not grip and was clumsy with his left hand. Over a period of a week his hand strength slightly improved but then worsened. He obtained MRI C-spine which was normal but he was referred to neurosurgery. Neurosurgery ( Dr. Joya Maynard ) ordered MRI brain which was obtained on Wednesday. This showed bilateral Infarcts in watershed area. He was to see Dr. Maxie Maynard out patient neruology but Dr. Maxie Maynard sent him to ED. When seen by Dr. Leonel Maynard in the ED the patient showed no weakness in his left hand.   Date last known well: Date: 05/26/2013  Time last known well: Unable to determine  tPA Given: No: out of window   SUBJECTIVE The patient's wife is present today. The patient is anxious for discharge. Dr. Leonie Maynard instructed him to increase his activity gradually. He will need a 30 day cardiac monitor.  OBJECTIVE Most recent Vital Signs: Filed Vitals:   07/05/13 1601 07/05/13 2109 07/06/13 0106 07/06/13 0530  BP: 132/69 153/84 157/81 129/74  Pulse: 67 72 63 65  Temp: 97.5 F (36.4 C) 97.8 F (36.6 C) 98.5 F (36.9 C) 97.9 F (36.6 C)  TempSrc: Oral Oral Oral Oral  Resp: 18 18 18 18   Height:      Weight:      SpO2: 96% 94% 96% 97%   CBG (last 3)  No results found for this basename: GLUCAP,  in the last 72 hours  IV Fluid Intake:   . sodium chloride 500 mL (07/04/13 1611)    MEDICATIONS  . aspirin  300 mg Rectal Daily   Or  . aspirin  325 mg Oral Daily  . enoxaparin (LOVENOX) injection  40 mg Subcutaneous Q24H  . pneumococcal 23 valent vaccine  0.5 mL Intramuscular Tomorrow-1000  . simvastatin  20 mg Oral q1800   PRN:  acetaminophen,  acetaminophen, senna-docusate  Diet:  General thin liquids Activity:  As tolerated DVT Prophylaxis:  Lovenox  CLINICALLY SIGNIFICANT STUDIES Basic Metabolic Panel:   Recent Labs Lab 07/04/13 1146  NA 142  K 4.1  CL 103  CO2 26  GLUCOSE 94  BUN 21  CREATININE 1.20  CALCIUM 9.3   Liver Function Tests:   Recent Labs Lab 07/04/13 1146  AST 17  ALT 18  ALKPHOS 105  BILITOT 0.4  PROT 7.2  ALBUMIN 3.6   CBC:   Recent Labs Lab 07/04/13 1146  WBC 6.7  NEUTROABS 4.2  HGB 15.3  HCT 44.7  MCV 96.5  PLT 180   Coagulation:   Recent Labs Lab 07/04/13 1146  LABPROT 13.5  INR 1.05   Cardiac Enzymes: No results found for this basename: CKTOTAL, CKMB, CKMBINDEX, TROPONINI,  in the last 168 hours Urinalysis: No results found for this basename: COLORURINE, APPERANCEUR, LABSPEC, PHURINE, GLUCOSEU, HGBUR, BILIRUBINUR, KETONESUR, PROTEINUR, UROBILINOGEN, NITRITE, LEUKOCYTESUR,  in the last 168 hours Lipid Panel    Component Value Date/Time   CHOL 190 07/05/2013 0530   TRIG 194* 07/05/2013 0530   HDL 36* 07/05/2013 0530   CHOLHDL 5.3 07/05/2013 0530   VLDL 39 07/05/2013 0530   LDLCALC 115* 07/05/2013 0530   HgbA1C  Lab Results  Component Value Date   HGBA1C 5.8* 07/05/2013    Urine Drug Screen:   No results found for this basename: labopia,  cocainscrnur,  labbenz,  amphetmu,  thcu,  labbarb    Alcohol Level: No results found for this basename: ETH,  in the last 168 hours  Dg Chest 2 View 07/04/2013    1. Mildly elevated right hemidiaphragm with associated passive atelectasis. 2. Thoracic spondylosis.     CT of the brain    MRI of the brain - canceled  MRA of the brain  07/05/2013  Negative intracranial MRA, allowing for mild motion artifact.  MRI cervical spine - Pending    2D Echocardiogram -  ejection fraction 60-65%. No cardiac source of emboli identified.  Carotid Doppler  Preliminary report: 1-39% ICA stenosis. Vertebral artery flow is  antegrade.    EKG  sinus rhythm rate 76 beats per minute. For complete results please see formal report.   Therapy Recommendations no followup recommended  Physical Exam    Mental Status:  Alert, oriented, thought content appropriate. Speech fluent without evidence of aphasia. Able to follow 3 step commands without difficulty.  Cranial Nerves:  II: Discs flat bilaterally; Visual fields grossly normal, pupils equal, round, reactive to light and accommodation  III,IV, VI: ptosis not present, extra-ocular motions intact bilaterally  V,VII: smile symmetric, facial light touch sensation normal bilaterally  VIII: hearing normal bilaterally  IX,X: gag reflex present  XI: bilateral shoulder shrug  XII: midline tongue extension without atrophy or fasciculations  Motor:  Right : Upper extremity 5/5 Left: Upper extremity 5/5  Lower extremity 5/5 Lower extremity 5/5  Tone and bulk:normal tone throughout; no atrophy noted  Sensory: Pinprick and light touch intact throughout, bilaterally  Deep Tendon Reflexes:  Right: Upper Extremity Left: Upper extremity  biceps (C-5 to C-6) 2/4 biceps (C-5 to C-6) 2/4  tricep (C7) 2/4 triceps (C7) 2/4  Brachioradialis (C6) 2/4 Brachioradialis (C6) 2/4  Lower Extremity Lower Extremity  quadriceps (L-2 to L-4) 2/4 quadriceps (L-2 to L-4) 2/4  Achilles (S1) 1/4 Achilles (S1) 1/4  Plantars:  Right: downgoing Left: downgoing  Cerebellar:  normal finger-to-nose, normal heel-to-shin test  Gait: not tested    ASSESSMENT [name] is a 80 y.o. male presenting with a history of left hand weakness  x several weeks and recent MRI consistent with bilateral  Subacute infarcts in a watershed area. TPA was not administered as the patient was out of the window for treatment and his deficits had resolved.  MRI/MRA here is pending. On aspirin 81 mg orally every day prior to admission. Now on aspirin 325 mg orally every day for secondary stroke prevention. Patient  with resultant resolution of deficits. Stroke work up completed   Hyperlipidemia  - Cholesterol 190 ; LDL 115  History of degenerative spondylosis of the cervical spine.  Hemoglobin A1c 5.8  Telemetry reveals sinus rhythm with occasional PVCs and 1 episode of bigeminy.   Hospital day # 2  TREATMENT/PLAN  Continue aspirin 325 mg orally every day for secondary stroke prevention.  MRI of the cervical spine?  No further therapy recommended  Low dose statin added.  The patient has a 30 day cardiac monitor.  Followup Dr. Leonie Maynard in 2 months   Mikey Bussing PA-C [facility] Pager [phone] 07/06/2013, 9:02 AM  I have personally obtained a history, examined the patient, evaluated imaging results, and formulated the assessment and plan of care. I agree with the  above.  Antony Contras, MD  To contact Stroke Continuity provider, please refer to [email or web address] After hours, contact General Neurology

## 2013-07-06 NOTE — Discharge Summary (Addendum)
Physician Discharge Summary  TESLA BOCHICCHIO AVW:[id number] DOB: Oct 23, 1933 DOA: 07/04/2013  PCP: Simona Huh, MD  Admit date: 07/04/2013 Discharge date: 07/06/2013  Time spent: 35 minutes  Recommendations for Outpatient Follow-up:  1. Follow up with PCP in 1-2 weeks 2. Follow up with Dr. Leonie Man in 2 months 3. Follow up Holter Monitor  Discharge Diagnoses:  Principal Problem:   CVA (cerebral infarction)   Discharge Condition: Stable  Diet recommendation: Regular  Filed Weights   07/04/13 1113 07/04/13 1153  Weight: 93.441 kg (206 lb) 93.441 kg (206 lb)    History of present illness:  Edward Maynard is a 80 y.o. male with no significant past medical history, history of bilateral shoulder surgery, physically active and plays golf, referred by neurologists office for evaluation of left upper extremity weakness and MRI which shows stroke. On 05/26/2013, patient experienced an onset of left upper extremity weakness, especially of his hand without any tingling, numbness, worsening neck pain or any other complaints. He was unable to use his left hand to do his activities of daily living. He was seen by his PCP and treated with a course of steroids for presumed cervical disc disease. His symptoms gradually improved over the next week. Soon after he stopped his prednisone, he again had weakness of left upper extremity on 06/02/2013 which was worse than the previous episode. He was unable to even put on his cloths or hold anything. With this he had some unsteadiness of gait and generally feeling unwell. No headache, slurred speech or facial asymmetry. PCPs office had patient MRI of cervical spine over the next several days and then he was referred to neurosurgery. Dr.Botero, neurosurgery evaluated him on 07/02/2013 and suspected his left upper extremity weakness was secondary to stroke rather than a cervical spine or neurosurgical issue. MRI brain was requested which revealed bilateral infarction  watershed area. Patient had an appointment to see Dr. Maxie Barb, neurology on 07/07/2013. However patient was called by the neurologist office today and advised to come to the hospital for further evaluation. In the interim, patient states that his left upper extremity strength has returned almost to baseline.  Hospital Course:  1. CVA  1. MRA brain unremarkable 2. Carotid dopplers were unremarkable 3. 2D echo unremarkable 4. No needs per OT and PT 5. On statin and ASA per Neurology recs 6. Neurology recommends outpatient Holter 2. HTN  1. Stable 2. Cont home meds 3. COPD  1. Stable on minimal O2 support 4. DVT prophylaxis  1. lovenox subQ  Consultations:  Neurology  Discharge Exam: Filed Vitals:   07/05/13 2109 07/06/13 0106 07/06/13 0530 07/06/13 0935  BP: 153/84 157/81 129/74 122/76  Pulse: 72 63 65 81  Temp: 97.8 F (36.6 C) 98.5 F (36.9 C) 97.9 F (36.6 C) 97.4 F (36.3 C)  TempSrc: Oral Oral Oral Oral  Resp: 18 18 18 20   Height:      Weight:      SpO2: 94% 96% 97% 97%   General: Awake, in nad Cardiovascular: regular, s1, s2 Respiratory: normal resp effort, no wheezing  Discharge Instructions       Future Appointments Provider Department Dept Phone   07/07/2013 10:00 AM Star Age, MD [facility] [phone]       Medication List         aspirin EC 81 MG tablet  Take 81 mg by mouth daily.     etodolac 400 MG tablet  Commonly known as:  LODINE  Take 400 mg  by mouth daily as needed (knee pain).     simvastatin 20 MG tablet  Commonly known as:  ZOCOR  Take 1 tablet (20 mg total) by mouth daily at 6 PM.       No Known Allergies Follow-up Information   Follow up with Simona Huh, MD. Schedule an appointment as soon as possible for a visit in 1 week.   Specialty:  Family Medicine   Contact information:   [address], [place] [address] [phone]       Follow up with Forbes Cellar, MD. Schedule  an appointment as soon as possible for a visit in 2 months.   Specialties:  Neurology, Radiology   Contact information:   [address] [place] [address] [phone]       Follow up with Follow up with Holter Monitor.       The results of significant diagnostics from this hospitalization (including imaging, microbiology, ancillary and laboratory) are listed below for reference.    Significant Diagnostic Studies: Dg Chest 2 View  07/04/2013   CLINICAL DATA:  Stroke.  EXAM: CHEST  2 VIEW  COMPARISON:  None  FINDINGS: Elevated right hemidiaphragm. Mild passive atelectasis along the right hemidiaphragm.  Thoracic spondylosis. Cardiac and mediastinal margins appear normal. No pleural effusion identified.  IMPRESSION: 1. Mildly elevated right hemidiaphragm with associated passive atelectasis. 2. Thoracic spondylosis.   Electronically Signed   By: Sherryl Barters M.D.   On: 07/04/2013 18:26   Mr Jodene Nam Head/brain Wo Cm  07/05/2013   CLINICAL DATA:  80 year old male with acute and subacute infarcts discovered on 07/02/2013 brain MRI. Upper extremity numbness and weakness. Initial encounter.  EXAM: MRA HEAD WITHOUT CONTRAST  TECHNIQUE: Angiographic images of the Circle of Willis were obtained using MRA technique without intravenous contrast.  COMPARISON:  Brain MRI 07/02/2013.  FINDINGS: Antegrade flow in the posterior circulation. Dominant distal right vertebral artery. Normal left PICA origin. Patent vertebrobasilar junction. No basilar stenosis. AICA origins are patent. SCA and left PCA origins are normal. Fetal type right PCA origin. Left posterior communicating artery diminutive or absent. Normal bilateral PCA branches.  Antegrade flow in both ICA siphons. No ICA stenosis. Ophthalmic artery and right posterior communicating artery origins are within normal limits. Patent carotid termini.  Normal MCA and ACA origins. Diminutive or absent anterior communicating artery. No M1 segment  stenosis. Motion artifact mildly degrading detail of the bilateral MCA and ACA branches.Allowing for motion visualized bilateral MCA and ACA branches are within normal limits.  IMPRESSION: Negative intracranial MRA, allowing for mild motion artifact.   Electronically Signed   By: Lars Pinks M.D.   On: 07/05/2013 12:51    Microbiology: No results found for this or any previous visit (from the past 240 hour(s)).   Labs: Basic Metabolic Panel:  Recent Labs Lab 07/04/13 1146  NA 142  K 4.1  CL 103  CO2 26  GLUCOSE 94  BUN 21  CREATININE 1.20  CALCIUM 9.3   Liver Function Tests:  Recent Labs Lab 07/04/13 1146  AST 17  ALT 18  ALKPHOS 105  BILITOT 0.4  PROT 7.2  ALBUMIN 3.6   No results found for this basename: LIPASE, AMYLASE,  in the last 168 hours No results found for this basename: AMMONIA,  in the last 168 hours CBC:  Recent Labs Lab 07/04/13 1146  WBC 6.7  NEUTROABS 4.2  HGB 15.3  HCT 44.7  MCV 96.5  PLT 180  Cardiac Enzymes: No results found for this basename: CKTOTAL, CKMB, CKMBINDEX, TROPONINI,  in the last 168 hours BNP: BNP (last 3 results) No results found for this basename: PROBNP,  in the last 8760 hours CBG: No results found for this basename: GLUCAP,  in the last 168 hours  Signed:  Donne Hazel  [facility] 07/06/2013, 1:55 PM

## 2013-07-07 ENCOUNTER — Encounter: Payer: Self-pay | Admitting: Neurology

## 2013-07-07 ENCOUNTER — Ambulatory Visit (INDEPENDENT_AMBULATORY_CARE_PROVIDER_SITE_OTHER): Payer: Medicare Other | Admitting: Neurology

## 2013-07-07 VITALS — BP 138/70 | HR 69 | Temp 97.4°F | Ht 72.0 in | Wt 212.0 lb

## 2013-07-07 DIAGNOSIS — I6350 Cerebral infarction due to unspecified occlusion or stenosis of unspecified cerebral artery: Secondary | ICD-10-CM

## 2013-07-07 DIAGNOSIS — E785 Hyperlipidemia, unspecified: Secondary | ICD-10-CM

## 2013-07-07 DIAGNOSIS — I4891 Unspecified atrial fibrillation: Secondary | ICD-10-CM

## 2013-07-07 DIAGNOSIS — I639 Cerebral infarction, unspecified: Secondary | ICD-10-CM

## 2013-07-07 NOTE — Progress Notes (Signed)
Subjective:    Patient ID: Edward Maynard is a 80 y.o. male.  HPI    Star Age, MD, PhD [facility] [address], [address] [place], [place] [address]  Dear Dr. Joya Salm,   I saw your patient, Edward Maynard, upon your kind request in my neurologic clinic today for initial consultation of his stroke. The patient is accompanied by his wife today. As you know, [name] is a very friendly 80 year old right-handed gentleman with an underlying medical history of arthritis, degenerative spine disease, who reports waking up on 05/26/2013 with inability to move his left upper extremity normally as well as incoordination. He had no numbness. You ordered an MRI of the brain and he had a brain MRI on 07/02/2013 with and without contrast. This showed multifocal areas of acute and subacute infarction in the supratentorial compartment, affecting the right and left. This could represent a previous hypotensive event or circulatory arrhythmia. Atrophy and small vessel disease, no acute hemorrhage. Based on the fact that he had multiple strokes of varying ages, we called him on 07/04/2013 and encouraged him to proceed to the emergency room for more urgent attention. He was admitted to the hospital for further workup for stroke. He was admitted on 07/04/2013 and discharged to home on 07/06/2013. He was seen by neurology in consultation. I reviewed old hospital records. Chest x-ray 2 views from 07/04/2013 showed mildly elevated right hemidiaphragm with associated passive atelectasis, thoracic spondylosis. MR a head on 07/05/2013 showed negative findings except for motion artifact. MRI brain from 07/02/2013 was reviewed above. Carotid Doppler study from 07/05/2013 showed bilateral 1-39% ICA stenosis, vertebral artery flow antegrade. 2-D echocardiogram from 07/04/2013 showed EF of 20-25%, grade 1 diastolic dysfunction, trivial aortic valve regurgitation. He had trivial mitral valve  regurgitation, mild dilatation of left atrium and trivial tricuspid regurgitation. Right atrium was normal in size.  He had a cardiac monitor placed. He was started on low-dose statin, and aspirin 325 mg daily and Zocor 20 mg for LDL of 115 and HDL of 36, but he has been taking a baby ASA daily, which he was on before, but not consistently.   He originally noted left upper extremity weakness and incoordination on 05/26/2013. Weakness became worse on 06/02/2013. By the time he was discharged from the hospital his weakness on the left side had resolved. He did not need outpatient therapy. He reports that his strength and coordination is back to baseline but overall he does not feel quite 100% back to baseline and report some fatigue and just some physical exhaustion. He has not been given a cardiac monitor yet. He does not endorse any palpitations or shortness of breath but his wife noticed that he sometimes seems to take a deeper breath, and she noticed this in the last month or so. She does feel that he has been overdoing it in the last few months. She does not notice any significant snoring at night and has never noted any apneas but will pay more attention to this. He denies waking up with a sense of gasping for air. His stroke risk factors include hyperlipidemia, age, gender, and family history of stroke in his father who had a stroke in his late 67s, stroke and his older brother who had a stroke in his early 39s and a nephew with a stroke in his 67s.  His Past Medical History Is Significant For: No past medical history on file.  His Past Surgical History Is Significant For:  Past Surgical History  Procedure Laterality Date  . Shoulder surgery Bilateral     His Family History Is Significant For: Family History  Problem Relation Age of Onset  . Hypertension Mother   . Hypertension Father   . COPD Brother     His Social History Is Significant For: History   Social History  . Marital  Status: Married    Spouse Name: N/A    Number of Children: N/A  . Years of Education: N/A   Social History Main Topics  . Smoking status: Former Research scientist (life sciences)  . Smokeless tobacco: None  . Alcohol Use: No  . Drug Use: No  . Sexual Activity: None   Other Topics Concern  . None   Social History Narrative  . None    His Allergies Are:  No Known Allergies:   His Current Medications Are:  Outpatient Encounter Prescriptions as of 07/07/2013  Medication Sig  . aspirin EC 81 MG tablet Take 81 mg by mouth daily.  Marland Kitchen etodolac (LODINE) 400 MG tablet Take 400 mg by mouth daily as needed (knee pain).  . simvastatin (ZOCOR) 20 MG tablet Take 1 tablet (20 mg total) by mouth daily at 6 PM.  :   Review of Systems:  Out of a complete 14 point review of systems, all are reviewed and negative with the exception of these symptoms as listed below:   Review of Systems  Constitutional: Positive for fatigue.  HENT: Negative.   Eyes: Negative.   Respiratory: Negative.   Cardiovascular: Negative.   Gastrointestinal: Negative.   Endocrine: Negative.   Genitourinary:       Impotence   Musculoskeletal: Negative.   Skin: Negative.   Allergic/Immunologic: Negative.   Neurological: Positive for weakness.  Hematological: Negative.   Psychiatric/Behavioral: Negative.     Objective:  Neurologic Exam  Physical Exam Physical Examination:   Filed Vitals:   07/07/13 1011  BP: 138/70  Pulse: 69  Temp: 97.4 F (36.3 C)    General Examination: The patient is a very pleasant 80 y.o. male in no acute distress. He appears well-developed and well-nourished and well groomed. He is mildly anxious appearing.  HEENT: Normocephalic, atraumatic, pupils are equal, round and reactive to light and accommodation. Funduscopic exam is normal with sharp disc margins noted. Extraocular tracking is good without limitation to gaze excursion or nystagmus noted. Normal smooth pursuit is noted. Hearing is grossly intact.  Tympanic membranes are clear bilaterally. Face is symmetric with normal facial animation and normal facial sensation. Speech is clear with no dysarthria noted. There is no hypophonia. There is no lip, neck/head, jaw or voice tremor. Neck is supple with full range of passive and active motion. There are no carotid bruits on auscultation. Oropharynx exam reveals: mild mouth dryness, adequate dental hygiene and moderate airway crowding, due to redundant soft palate and wider and longer uvula. Mallampati is class II. Tongue protrudes centrally and palate elevates symmetrically.   Chest: Clear to auscultation without wheezing, rhonchi or crackles noted.  Heart: S1+S2+0, regular and normal without murmurs, rubs or gallops noted.   Abdomen: Soft, non-tender and non-distended with normal bowel sounds appreciated on auscultation.  Extremities: There is no pitting edema in the distal lower extremities bilaterally. Pedal pulses are intact.  Skin: Warm and dry without trophic changes noted. There are no varicose veins.  Musculoskeletal: exam reveals no obvious joint deformities, tenderness or joint swelling or erythema.   Neurologically:  Mental status: The patient is awake, alert and oriented in  all 4 spheres. His immediate and remote memory, attention, language skills and fund of knowledge are appropriate. There is no evidence of aphasia, agnosia, apraxia or anomia. Speech is clear with normal prosody and enunciation. Thought process is linear. Mood is normal and affect is normal.  Cranial nerves II - XII are as described above under HEENT exam. In addition: shoulder shrug is normal with equal shoulder height noted. Motor exam: Normal bulk, strength and tone is noted. There is no drift, tremor or rebound. Romberg is negative. Reflexes are 2+ throughout. Babinski: Toes are flexor bilaterally. Fine motor skills and coordination: intact with normal finger taps, normal hand movements, normal rapid alternating  patting, normal foot taps and normal foot agility.  Cerebellar testing: No dysmetria or intention tremor on finger to nose testing. Heel to shin is unremarkable bilaterally. There is no truncal or gait ataxia.  Sensory exam: intact to light touch, pinprick, vibration, temperature sense in the upper and lower extremities.  Gait, station and balance: He stands easily. No veering to one side is noted. No leaning to one side is noted. Posture is age-appropriate and stance is narrow based. Gait shows normal stride length and normal pace. No problems turning are noted. He turns in 3 steps. Tandem walk is slightly difficult for him and so his toe stance.              Assessment and Plan:    In summary, JAYON MATTON is a very pleasant 80 y.o.-year old male with a history of arthritis, and degenerative spine disease, who presented with new onset left upper extremity weakness and incoordination about a month ago and his recent brain MRI showed multifocal areas of acute and subacute infarction in the supratentorial compartment, affecting the right and left. His symptoms have largely resolved and his physical exam is nonfocal today. I reassured him in that regard. Nevertheless, I talked to him at length about not overdoing things. I discouraged him from heavy lifting and from playing golf for prolonged periods of time. I asked him to ease into physical activity slowly and avoid heavy lifting from now on. Furthermore, according to the neurology consultation and given that he has had multiple bilateral strokes I would like for him to take an aspirin adult size every day. If he has excessive bruising we may have to scale back to 81 mg daily. I would like for him to continue low-dose statin for goal of LDL below 100 if possible. He does have a family history of stroke on his father's side. I asked him to stay very well hydrated and we talked about secondary stroke prevention. He quit smoking in 1972. He is advised to watch  for symptoms of sleep apnea and his wife is advised to watch for any apneas or gasping sounds. At this juncture, I will order a cardiac monitor as he has not yet been given an appointment for the monitor. Upon discharge from the hospital he was advised to follow up in stroke clinic which I will also arrange for him. I will see him back routinely in 3 months and encouraged them to call with any further questions. We should be able to call him back with his cardiac monitor the results. I answered all their questions and the patient and his wife were in agreement.  Thank you very much for allowing me to participate in the care of this nice patient. If I can be of any further assistance to you please do not hesitate to  call me at [phone].  Sincerely,   Star Age, MD, PhD

## 2013-07-07 NOTE — Patient Instructions (Addendum)
Continue exercising regularly and take your medications as directed. As discussed, secondary prevention is key after a stroke. This means: taking care of blood sugar values or diabetes management, good blood pressure (hypertension) control and optimizing cholesterol management, exercising daily or regularly within your own mobility limitations of course, and overall cardiovascular risk factor reduction, which includes screening for and treatment of obstructive sleep apnea (OSA) and weight management.   Do not lift heavy weights. Don't wear yourself out. Stay very well hydrated and use sun protection. FU with Dr. Leonie Man in 2 months. We will order a 30 day heart monitor. If you don't hear back in the next 3 days regarding the heart monitor, call us back and inquire. We will call you with the results. Take aspirin daily 325 mg. If you have excess bruising, we may have to go back to 81 mg daily. Call if questions.

## 2013-07-08 ENCOUNTER — Encounter (INDEPENDENT_AMBULATORY_CARE_PROVIDER_SITE_OTHER): Payer: Medicare Other

## 2013-07-08 ENCOUNTER — Other Ambulatory Visit: Payer: Self-pay | Admitting: *Deleted

## 2013-07-08 DIAGNOSIS — I6350 Cerebral infarction due to unspecified occlusion or stenosis of unspecified cerebral artery: Secondary | ICD-10-CM

## 2013-07-08 DIAGNOSIS — I639 Cerebral infarction, unspecified: Secondary | ICD-10-CM

## 2013-07-08 DIAGNOSIS — G459 Transient cerebral ischemic attack, unspecified: Secondary | ICD-10-CM

## 2013-07-08 DIAGNOSIS — E785 Hyperlipidemia, unspecified: Secondary | ICD-10-CM

## 2013-07-08 DIAGNOSIS — I480 Paroxysmal atrial fibrillation: Secondary | ICD-10-CM

## 2013-07-08 NOTE — Addendum Note (Signed)
Addended byOliver Hum on: 07/08/2013 11:43 AM   Modules accepted: Orders

## 2013-07-08 NOTE — Progress Notes (Signed)
Ivin Booty from [facility] called and pt is there now and they are needing another diagnosis code for the cardiac event monitor.  Consulted Dr. Rexene Alberts, 427.31 for paroxysmal afib can be used.

## 2013-07-14 ENCOUNTER — Telehealth: Payer: Self-pay | Admitting: Neurology

## 2013-07-14 ENCOUNTER — Telehealth: Payer: Self-pay | Admitting: Internal Medicine

## 2013-07-14 NOTE — Telephone Encounter (Signed)
Need to call Dr Clydene Fake office [phone].  I have called [employer] and let them know he is the ordering MD

## 2013-07-14 NOTE — Telephone Encounter (Signed)
Tantiago with [employer] [phone]) calling stating that the recent order for heart monitor - Patient's diagnosis is not covered under the insurance - is there another possible diagnosis?  Please call them back.

## 2013-07-14 NOTE — Telephone Encounter (Signed)
[employer] is calling and needs additional diagnoses, please call and advise.

## 2013-07-15 NOTE — Telephone Encounter (Signed)
I called and LMVM for Edward Maynard at [facility] to return call about paroxy. afib ICD 427.31 relating to cardiac event monitor.

## 2013-07-16 ENCOUNTER — Ambulatory Visit: Payer: Medicare Other | Admitting: Neurology

## 2013-07-17 NOTE — Telephone Encounter (Signed)
LMVM for Tantiago again re: this pt.  He is to call me back if needed.  I left [phone].

## 2013-08-15 ENCOUNTER — Telehealth: Payer: Self-pay | Admitting: Neurology

## 2013-08-15 NOTE — Telephone Encounter (Signed)
I called and gave him the results of the cardiac event monitor results as per below.  He verbalized understanding.

## 2013-08-15 NOTE — Telephone Encounter (Signed)
Please call patient and advise him that his cardiac event monitor showed no sustained arrhythmias. His cardiac rhythm was normal with the occasional extra heartbeat called PVC, which is fairly physiological and not a sign of a sinister cardiac condition. The reading cardiologist saw no evidence of irregular heartbeat which is known as atrial fibrillation. No further action is required on this test at this time.

## 2013-08-26 ENCOUNTER — Encounter: Payer: Self-pay | Admitting: Gastroenterology

## 2013-10-01 ENCOUNTER — Encounter: Payer: Self-pay | Admitting: Neurology

## 2013-10-01 ENCOUNTER — Ambulatory Visit (INDEPENDENT_AMBULATORY_CARE_PROVIDER_SITE_OTHER): Payer: Medicare Other | Admitting: Neurology

## 2013-10-01 ENCOUNTER — Encounter (INDEPENDENT_AMBULATORY_CARE_PROVIDER_SITE_OTHER): Payer: Self-pay

## 2013-10-01 VITALS — BP 121/73 | HR 64 | Ht 72.0 in | Wt 217.0 lb

## 2013-10-01 DIAGNOSIS — I6330 Cerebral infarction due to thrombosis of unspecified cerebral artery: Secondary | ICD-10-CM

## 2013-10-01 DIAGNOSIS — E785 Hyperlipidemia, unspecified: Secondary | ICD-10-CM

## 2013-10-01 MED ORDER — SIMVASTATIN 40 MG PO TABS: 40.0000 mg | ORAL_TABLET | Freq: Every day | ORAL | Status: DC

## 2013-10-01 NOTE — Progress Notes (Signed)
STROKE NEUROLOGY FOLLOW UP NOTE  NAME: Edward Maynard DOB: 12-22-1933  REASON FOR VISIT: stroke follow up HISTORY FROM: pt and his wife  Today we had the pleasure of seeing Edward Maynard in follow-up at our Neurology Clinic. Pt was accompanied by wife.   History Summary 80 yo M with PMH of DJD waking up on 05/26/13 with weakness of left arm and hand and incoordination. Went to see PCP-PA, considering cervical DJD and was prescribed with prednisone. For the following week, his weakness getting somewhat better but not resolved. One week later, he woke up again with worsening left arm and hand weakness, much worse, but also off balance, slowed down a lot and not able to drive. They went back to see PCP and had cervical MRI done and referred to see a neurosurgeon who recommend to have MRI brain done. Brain MRI done on 4/15 and it showed multifocal acute and subacute infarction involving right frontal MCA/ACA and left parietal MCA/PCA watershed areas. He was referred to see Dr. Rexene Alberts who sent pt to [facility] for admission for stroke work up. MRA, TTE and CUS did not reveal cause of stroke and he was discharged with ASA and zocor as well as scheduled OP cardionet.   Interval History  During the interval time, the patient has been doing well. No more episode of weakness and his left arm and hand are back to his baseline. He is back to golf court. His did cardionet monitoring for 30 days and no afib was found as per pt and his wife.     The following represents the patient's updated allergies and side effects list: No Known Allergies  Labs since last visit of relevance include the following: Results for orders placed during the hospital encounter of 07/04/13  PROTIME-INR      Result Value Ref Range   Prothrombin Time 13.5  11.6 - 15.2 seconds   INR 1.05  0.00 - 1.49  APTT      Result Value Ref Range   aPTT 30  24 - 37 seconds  CBC      Result Value Ref Range   WBC 6.7  4.0 - 10.5 K/uL   RBC 4.63  4.22 - 5.81 MIL/uL   Hemoglobin 15.3  13.0 - 17.0 g/dL   HCT 44.7  39.0 - 52.0 %   MCV 96.5  78.0 - 100.0 fL   MCH 33.0  26.0 - 34.0 pg   MCHC 34.2  30.0 - 36.0 g/dL   RDW 13.4  11.5 - 15.5 %   Platelets 180  150 - 400 K/uL  DIFFERENTIAL      Result Value Ref Range   Neutrophils Relative % 62  43 - 77 %   Neutro Abs 4.2  1.7 - 7.7 K/uL   Lymphocytes Relative 24  12 - 46 %   Lymphs Abs 1.6  0.7 - 4.0 K/uL   Monocytes Relative 12  3 - 12 %   Monocytes Absolute 0.8  0.1 - 1.0 K/uL   Eosinophils Relative 2  0 - 5 %   Eosinophils Absolute 0.1  0.0 - 0.7 K/uL   Basophils Relative 0  0 - 1 %   Basophils Absolute 0.0  0.0 - 0.1 K/uL  COMPREHENSIVE METABOLIC PANEL      Result Value Ref Range   Sodium 142  137 - 147 mEq/L   Potassium 4.1  3.7 - 5.3 mEq/L   Chloride 103  96 - 112  mEq/L   CO2 26  19 - 32 mEq/L   Glucose, Bld 94  70 - 99 mg/dL   BUN 21  6 - 23 mg/dL   Creatinine, Ser 1.20  0.50 - 1.35 mg/dL   Calcium 9.3  8.4 - 10.5 mg/dL   Total Protein 7.2  6.0 - 8.3 g/dL   Albumin 3.6  3.5 - 5.2 g/dL   AST 17  0 - 37 U/L   ALT 18  0 - 53 U/L   Alkaline Phosphatase 105  39 - 117 U/L   Total Bilirubin 0.4  0.3 - 1.2 mg/dL   GFR calc non Af Amer 57 (*) >90 mL/min   GFR calc Af Amer 66 (*) >90 mL/min  HEMOGLOBIN A1C      Result Value Ref Range   Hemoglobin A1C 5.8 (*) <5.7 %   Mean Plasma Glucose 120 (*) <117 mg/dL  LIPID PANEL      Result Value Ref Range   Cholesterol 190  0 - 200 mg/dL   Triglycerides 194 (*) <150 mg/dL   HDL 36 (*) >39 mg/dL   Total CHOL/HDL Ratio 5.3     VLDL 39  0 - 40 mg/dL   LDL Cholesterol 115 (*) 0 - 99 mg/dL  I-STAT TROPOININ, ED      Result Value Ref Range   Troponin i, poc 0.00  0.00 - 0.08 ng/mL   Comment 3             The neurologically relevant items on the patient's problem list were reviewed on today's visit.  Neurologic Examination  A problem focused neurological exam (12 or more points of the single system neurologic  examination, vital signs counts as 1 point, cranial nerves count for 8 points) was performed.  Blood pressure 121/73, pulse 64, height 6' (1.829 m), weight 217 lb (98.431 kg).  General - Well nourished, well developed, in no apparent distress.  Ophthalmologic - Sharp disc margins OU.  Cardiovascular - Regular rate and rhythm with no murmur.  Mental Status -  Level of arousal and orientation to time, place, and person were intact. Language including expression, naming, repetition, comprehension, reading, and writing was assessed and found intact. Attention span and concentration were normal. Recent and remote memory were intact. Fund of Knowledge was assessed and was intact.  Cranial Nerves II - XII - II - Vision intact OU. III, IV, VI - Extraocular movements intact. V - Facial sensation intact bilaterally. VII - Facial movement intact bilaterally. VIII - Hearing & vestibular intact bilaterally. X - Palate elevates symmetrically. XI - Chin turning & shoulder shrug intact bilaterally. XII - Tongue protrusion intact.  Motor Strength - The patient's strength was normal in all extremities and pronator drift was absent.  Bulk was normal and fasciculations were absent.   Motor Tone - Muscle tone was assessed at the neck and appendages and was normal.  Reflexes - The patient's reflexes were normal in all extremities and he had no pathological reflexes.  Sensory - Light touch, temperature/pinprick, vibration and proprioception, and Romberg testing were assessed and were normal.    Coordination - The patient had normal movements in the hands and feet with no ataxia or dysmetria.  Tremor was absent.  Gait and Station - The patient's transfers, posture, gait, station, and turns were observed as normal.  Data reviewed: I personally reviewed the images and agree with the radiology interpretations.  MRI of the brain 07/02/13 - multifocal areas of acute and subacute  infarction in the  supratentorial compartment, affecting left and right. This could represent hypotensive event or circulatory arrhythmia. MRA of the brain 07/05/2013 - Negative intracranial MRA, allowing for mild motion artifact.   2D Echocardiogram - ejection fraction 60-65%. No cardiac source of emboli identified.  Carotid Doppler Preliminary report: 1-39% ICA stenosis. Vertebral artery flow is antegrade.  EKG sinus rhythm rate 76 beats per minute. For complete results please see formal report.   Assessment: As you may recall, he is a 80 y.o.  Caucasian male with a diagnosis of embolic stroke. Pt recovered well from the stroke. MRI showed bilateral involvement, raising suspicion for cardioembolic stroke. So far cardionet was negative, but due to high suspicion, will request loop recorder for longer monitoring. At the meantime, will do TCD emboli detection. Continue ASA at 325mg  and increase zocor to 40mg  since his LDL was 115.  Diagnoses from this visit: Hyperlipidemia - Plan: simvastatin (ZOCOR) 40 MG tablet, Hepatic Function Panel, CK  Cerebral infarction due to thrombosis of cerebral artery - Plan: simvastatin (ZOCOR) 40 MG tablet, Ambulatory referral to Cardiac Electrophysiology, [place] TRANSCRANIAL DOPPLER EMBOLI MONITORING  Plan:  - loop recorder request - TCD emboli detection - continue ASA 325mg  and increase zocor to 40mg  - check LFT and CK - RTC in 2 months.  @ORDERS @ Orders Placed This Encounter  Procedures  . [place] TRANSCRANIAL DOPPLER EMBOLI MONITORING  . Hepatic Function Panel  . CK  . Ambulatory referral to Cardiac Electrophysiology   Patient Instructions  1. Continue ASA 325mg  dialiy 2. Increase zocor to 40mg  daily 3. Continue check BP at home goal 130/80 4. Will arrange for cardiac monitoring long term 5. Will check LFT and CK 6. Follow up in 2 months.   Rosalin Hawking, MD PhD [facility] [address] [place], [place] [address] [phone]

## 2013-10-01 NOTE — Patient Instructions (Signed)
1. Continue ASA 325mg  dialiy 2. Increase zocor to 40mg  daily 3. Continue check BP at home goal 130/80 4. Will arrange for cardiac monitoring long term 5. Will check LFT and CK 6. Follow up in 2 months.

## 2013-10-02 LAB — HEPATIC FUNCTION PANEL
ALK PHOS: 99 IU/L (ref 39–117)
ALT: 23 IU/L (ref 0–44)
AST: 20 IU/L (ref 0–40)
Albumin: 4.4 g/dL (ref 3.5–4.8)
BILIRUBIN DIRECT: 0.11 mg/dL (ref 0.00–0.40)
TOTAL PROTEIN: 6.9 g/dL (ref 6.0–8.5)
Total Bilirubin: 0.5 mg/dL (ref 0.0–1.2)

## 2013-10-02 LAB — CK: Total CK: 112 U/L (ref 24–204)

## 2013-10-03 ENCOUNTER — Telehealth: Payer: Self-pay | Admitting: Neurology

## 2013-10-03 DIAGNOSIS — E785 Hyperlipidemia, unspecified: Secondary | ICD-10-CM

## 2013-10-03 DIAGNOSIS — I6330 Cerebral infarction due to thrombosis of unspecified cerebral artery: Secondary | ICD-10-CM

## 2013-10-03 MED ORDER — SIMVASTATIN 40 MG PO TABS: 40.0000 mg | ORAL_TABLET | Freq: Every day | ORAL | Status: DC

## 2013-10-03 NOTE — Telephone Encounter (Signed)
Rx has been resent.  I called the patient back.  He is aware.

## 2013-10-03 NOTE — Telephone Encounter (Signed)
Patient calling to check status of Rx simvastatin (ZOCOR) 40 MG tablet.  Was informed Rx was sent to pharmacy on yesterday.  Please call and advise.  Thanks

## 2013-10-07 ENCOUNTER — Ambulatory Visit (INDEPENDENT_AMBULATORY_CARE_PROVIDER_SITE_OTHER): Payer: Medicare Other

## 2013-10-07 DIAGNOSIS — I6330 Cerebral infarction due to thrombosis of unspecified cerebral artery: Secondary | ICD-10-CM

## 2013-10-08 ENCOUNTER — Ambulatory Visit (AMBULATORY_SURGERY_CENTER): Payer: Self-pay | Admitting: *Deleted

## 2013-10-08 VITALS — Ht 72.0 in | Wt 218.2 lb

## 2013-10-08 DIAGNOSIS — Z1211 Encounter for screening for malignant neoplasm of colon: Secondary | ICD-10-CM

## 2013-10-08 MED ORDER — NA SULFATE-K SULFATE-MG SULF 17.5-3.13-1.6 GM/177ML PO SOLN: 1.0000 | Freq: Once | ORAL | Status: DC

## 2013-10-08 NOTE — Progress Notes (Signed)
No allergies to eggs or soy. No problems with anesthesia.  Pt given Emmi instructions for colonoscopy  No oxygen use  No diet drug use  

## 2013-10-09 ENCOUNTER — Encounter: Payer: Self-pay | Admitting: Internal Medicine

## 2013-10-20 ENCOUNTER — Encounter: Payer: Medicare Other | Admitting: Gastroenterology

## 2013-10-21 ENCOUNTER — Ambulatory Visit: Payer: Medicare Other | Admitting: Neurology

## 2013-10-23 ENCOUNTER — Ambulatory Visit (AMBULATORY_SURGERY_CENTER): Payer: Medicare Other | Admitting: Internal Medicine

## 2013-10-23 ENCOUNTER — Encounter: Payer: Self-pay | Admitting: Internal Medicine

## 2013-10-23 VITALS — BP 131/70 | HR 59 | Temp 97.8°F | Resp 16 | Ht 72.0 in | Wt 218.0 lb

## 2013-10-23 DIAGNOSIS — Z1211 Encounter for screening for malignant neoplasm of colon: Secondary | ICD-10-CM

## 2013-10-23 DIAGNOSIS — D120 Benign neoplasm of cecum: Secondary | ICD-10-CM

## 2013-10-23 DIAGNOSIS — Z860100 Personal history of colon polyps, unspecified: Secondary | ICD-10-CM

## 2013-10-23 DIAGNOSIS — D126 Benign neoplasm of colon, unspecified: Secondary | ICD-10-CM

## 2013-10-23 HISTORY — DX: Personal history of colonic polyps: Z86.010

## 2013-10-23 HISTORY — DX: Personal history of colon polyps, unspecified: Z86.0100

## 2013-10-23 MED ORDER — SODIUM CHLORIDE 0.9 % IV SOLN: 500.0000 mL | INTRAVENOUS | Status: DC

## 2013-10-23 NOTE — Op Note (Signed)
[place]  [address] [place], [address]   COLONOSCOPY PROCEDURE REPORT  PATIENT: Edward Maynard, Edward Maynard  MR#: [id number] BIRTHDATE: July 12, 1933 , 63  yrs. old GENDER: Male ENDOSCOPIST: Gatha Mayer, MD, [facility] PROCEDURE DATE:  10/23/2013 PROCEDURE:   Colonoscopy with snare polypectomy First Screening Colonoscopy - Avg.  risk and is 50 yrs.  old or older - No.  Prior Negative Screening - Now for repeat screening. 10 or more years since last screening  History of Adenoma - Now for follow-up colonoscopy & has been > or = to 3 yrs.  N/A  Polyps Removed Today? Yes. ASA CLASS:   Class III INDICATIONS:average risk screening and Last colonoscopy performed 10 years ago. MEDICATIONS: Propofol (Diprivan) 160 mg IV, MAC sedation, administered by CRNA, and These medications were titrated to patient response per physician's verbal order  DESCRIPTION OF PROCEDURE:   After the risks benefits and alternatives of the procedure were thoroughly explained, informed consent was obtained.  A digital rectal exam revealed no abnormalities of the rectum.   The LB [id number]  endoscope was introduced through the anus and advanced to the cecum, which was identified by both the appendix and ileocecal valve. No adverse events experienced.   The quality of the prep was excellent using Suprep  The instrument was then slowly withdrawn as the colon was fully examined.      COLON FINDINGS: A diminutive sessile polyp was found at the cecum. A polypectomy was performed with a cold snare.  The resection was complete and the polyp tissue was completely retrieved.   Mild diverticulosis was noted in the sigmoid colon.   The colon mucosa was otherwise normal.  Retroflexed views revealed no abnormalities. The time to cecum=3 minutes 30 seconds.  Withdrawal time=8 minutes 37 seconds.  The scope was withdrawn and the procedure completed. COMPLICATIONS: There were no complications.  ENDOSCOPIC  IMPRESSION: 1.   Diminutive sessile polyp was found at the cecum; polypectomy was performed with a cold snare 2.   Mild diverticulosis was noted in the sigmoid colon 3.   The colon mucosa was otherwise normal  RECOMMENDATIONS: Await pathology results - unlikely to need routine repeat colonoscopy   eSigned:  Gatha Mayer, MD, [facility] 10/23/2013 1:39 PM   cc: Gaynelle Arabian, MD and The Patient

## 2013-10-23 NOTE — Patient Instructions (Addendum)
One tiny polyp removed today - it looks benign. I doubt you will need another routine colonoscopy.  I will send a letter about the pathology results.  I appreciate the opportunity to care for you. Gatha Mayer, MD, FACG YOU HAD AN ENDOSCOPIC PROCEDURE TODAY AT [facility]: Refer to the procedure report that was given to you for any specific questions about what was found during the examination.  If the procedure report does not answer your questions, please call your gastroenterologist to clarify.  If you requested that your care partner not be given the details of your procedure findings, then the procedure report has been included in a sealed envelope for you to review at your convenience later.  YOU SHOULD EXPECT: Some feelings of bloating in the abdomen. Passage of more gas than usual.  Walking can help get rid of the air that was put into your GI tract during the procedure and reduce the bloating. If you had a lower endoscopy (such as a colonoscopy or flexible sigmoidoscopy) you may notice spotting of blood in your stool or on the toilet paper. If you underwent a bowel prep for your procedure, then you may not have a normal bowel movement for a few days.  DIET: Your first meal following the procedure should be a light meal and then it is ok to progress to your normal diet.  A half-sandwich or bowl of soup is an example of a good first meal.  Heavy or fried foods are harder to digest and may make you feel nauseous or bloated.  Likewise meals heavy in dairy and vegetables can cause extra gas to form and this can also increase the bloating.  Drink plenty of fluids but you should avoid alcoholic beverages for 24 hours.  ACTIVITY: Your care partner should take you home directly after the procedure.  You should plan to take it easy, moving slowly for the rest of the day.  You can resume normal activity the day after the procedure however you should NOT DRIVE or use heavy machinery for  24 hours (because of the sedation medicines used during the test).    SYMPTOMS TO REPORT IMMEDIATELY: A gastroenterologist can be reached at any hour.  During normal business hours, 8:30 AM to 5:00 PM Monday through Friday, call [phone].  After hours and on weekends, please call the GI answering service at [phone] who will take a message and have the physician on call contact you.   Following lower endoscopy (colonoscopy or flexible sigmoidoscopy):  Excessive amounts of blood in the stool  Significant tenderness or worsening of abdominal pains  Swelling of the abdomen that is new, acute  Fever of 100F or higher    FOLLOW UP: If any biopsies were taken you will be contacted by phone or by letter within the next 1-3 weeks.  Call your gastroenterologist if you have not heard about the biopsies in 3 weeks.  Our staff will call the home number listed on your records the next business day following your procedure to check on you and address any questions or concerns that you may have at that time regarding the information given to you following your procedure. This is a courtesy call and so if there is no answer at the home number and we have not heard from you through the emergency physician on call, we will assume that you have returned to your regular daily activities without incident.  SIGNATURES/CONFIDENTIALITY: You and/or your care partner  have signed paperwork which will be entered into your electronic medical record.  These signatures attest to the fact that that the information above on your After Visit Summary has been reviewed and is understood.  Full responsibility of the confidentiality of this discharge information lies with you and/or your care-partner.  Polyp information given.

## 2013-10-23 NOTE — Progress Notes (Signed)
Stable to RR 

## 2013-10-23 NOTE — Progress Notes (Signed)
Called to room to assist during endoscopic procedure.  Patient ID and intended procedure confirmed with present staff. Received instructions for my participation in the procedure from the performing physician.  

## 2013-10-24 ENCOUNTER — Telehealth: Payer: Self-pay | Admitting: *Deleted

## 2013-10-24 NOTE — Telephone Encounter (Signed)
Left message that we called for f/u 

## 2013-10-29 ENCOUNTER — Encounter: Payer: Self-pay | Admitting: Internal Medicine

## 2013-10-29 ENCOUNTER — Encounter: Payer: Self-pay | Admitting: *Deleted

## 2013-10-29 ENCOUNTER — Ambulatory Visit (INDEPENDENT_AMBULATORY_CARE_PROVIDER_SITE_OTHER): Payer: Medicare Other | Admitting: Internal Medicine

## 2013-10-29 ENCOUNTER — Encounter (HOSPITAL_COMMUNITY): Payer: Self-pay | Admitting: Pharmacy Technician

## 2013-10-29 VITALS — BP 132/84 | HR 68 | Ht 72.0 in | Wt 214.0 lb

## 2013-10-29 DIAGNOSIS — I6359 Cerebral infarction due to unspecified occlusion or stenosis of other cerebral artery: Secondary | ICD-10-CM

## 2013-10-29 DIAGNOSIS — E785 Hyperlipidemia, unspecified: Secondary | ICD-10-CM

## 2013-10-29 DIAGNOSIS — I6350 Cerebral infarction due to unspecified occlusion or stenosis of unspecified cerebral artery: Secondary | ICD-10-CM

## 2013-10-29 NOTE — Progress Notes (Signed)
ELECTROPHYSIOLOGY CONSULT NOTE  Patient ID: Edward Maynard MRN: [id number], DOB/AGE: 20-Dec-1933   Admit date: (Not on file) Date of Consult: 10/29/2013  Primary Physician: Simona Huh, MD Reason for Consultation: Cryptogenic stroke; recommendations regarding Implantable Loop Recorder  History of Present Illness Edward Maynard was admitted 4/15 with acute CVA. he has been monitored on telemetry which has demonstrated no arrhythmias. No cause has been identified.  He is now followed by Dr Erlinda Hong.  He wore a 30 day monitor which did not reveal any arrhythmias.  Given high suspicion for embolic source, he is referred for consideration of an implantable loop recorder.  EP has been asked to evaluate for placement of an implantable loop recorder to monitor for atrial fibrillation.  Past Medical History Past Medical History  Diagnosis Date  . Stroke 05/2013    MRI on 07/02/13 = Multifocal acute & subacute infarction involving right frontal MCA/ACA & left parietal MCA/PCA watershed areas  . High cholesterol     05/2013; no defecits  . Left arm weakness   . Left hand weakness   . Hyperlipemia     Past Surgical History Past Surgical History  Procedure Laterality Date  . Shoulder surgery Bilateral 2003, 1995    Allergies/Intolerances No Known Allergies Inpatient Medications   Social History History   Social History  . Marital Status: Married    Spouse Name: lea    Number of Children: 2  . Years of Education: college   Occupational History  . retired    Social History Main Topics  . Smoking status: Former Smoker    Quit date: 03/20/1970  . Smokeless tobacco: Never Used  . Alcohol Use: No  . Drug Use: No  . Sexual Activity: Yes   Other Topics Concern  . Not on file   Social History Narrative   Patient right handed   Patient lives with his wife   Patient coffee daily   Enjoys playing golf    Review of Systems All systems reviewed and are otherwise negative except as  noted above.  Physical Exam Blood pressure 132/84, pulse 68, height 6' (1.829 m), weight 214 lb (97.07 kg).  General: Well developed, well appearing 80 y.o. male in no acute distress. HEENT: Normocephalic, atraumatic. EOMs intact. Sclera nonicteric. Oropharynx clear.  Neck: Supple without bruits. No JVD. Lungs: Respirations regular and unlabored, CTA bilaterally. No wheezes, rales or rhonchi. Heart: RRR. S1, S2 present. No murmurs, rub, S3 or S4. Abdomen: Soft, non-tender, non-distended. BS present x 4 quadrants. No hepatosplenomegaly.  Extremities: No clubbing, cyanosis or edema. DP/PT/Radials 2+ and equal bilaterally. Psych: Normal affect. Neuro: Alert and oriented X 3. Moves all extremities spontaneously. Musculoskeletal: No kyphosis. Skin: Intact. Warm and dry. No rashes or petechiae in exposed areas.   Labs Lab Results  Component Value Date   WBC 6.7 07/04/2013   HGB 15.3 07/04/2013   HCT 44.7 07/04/2013   MCV 96.5 07/04/2013   PLT 180 07/04/2013   No results found for this basename: NA, K, CL, CO2, BUN, CREATININE, CALCIUM, LABALBU, PROT, BILITOT, ALKPHOS, ALT, AST, GLUCOSE,  in the last 168 hours No results found for this basename: INR,  in the last 72 hours  Radiology/Studies reviewed  Echocardiogram  reviewed  12-lead ECG sinus rhythm, normal ekg 30 day monitor sinus with PVCs, no arrhythmias  Epic records including Dr Ericka Pontiff notes and hospital records are reviewed  Assessment and Plan:  1. Cryptogenic stroke The patient presents with cryptogenic stroke.  2d  echo revealed no structural heart disease.  Given that his stroke occurred 4-5 months ago, I do not think that a TEE would be beneficial at this point.  I do think that ILR implant could be helpful.  Our local data suggests that 18% of patients with cryptogenic stroke have afib detected on ILR within the first year of implant.  The mean time from stroke to detected afib is 50 days.   I spoke at length with the patient  about monitoring for afib with an implantable loop recorder.  Risks, benefits, and alteratives to implantable loop recorder were discussed with the patient today.   At this time, the patient is very clear in their decision to proceed with implantable loop recorder.   We will schedule implant at the next available time.  Remote follow-up was also discussed today.

## 2013-10-29 NOTE — Patient Instructions (Signed)
Your physician recommends that you continue on your current medications as directed. Please refer to the Current Medication list given to you today.  Your physcian recommends that you have an implantable loop recorder placed. This procedure will be on Friday, Oct 31, 2013 at 7:30 am.  Please arrive at Short Stay at 6:00 am.

## 2013-10-30 ENCOUNTER — Encounter: Payer: Self-pay | Admitting: Internal Medicine

## 2013-10-30 ENCOUNTER — Other Ambulatory Visit: Payer: Self-pay | Admitting: *Deleted

## 2013-10-30 NOTE — Progress Notes (Signed)
Quick Note:  Diminutive adenoma - no colon recall due to findings + age ______

## 2013-10-31 ENCOUNTER — Ambulatory Visit (HOSPITAL_COMMUNITY)
Admission: RE | Admit: 2013-10-31 | Discharge: 2013-10-31 | Disposition: A | Discharge status: Home / Self Care | Payer: Medicare Other | Source: Ambulatory Visit | Attending: Internal Medicine | Admitting: Internal Medicine

## 2013-10-31 ENCOUNTER — Encounter (HOSPITAL_COMMUNITY)
Admission: RE | Disposition: A | Discharge status: Home / Self Care | Payer: Self-pay | Source: Ambulatory Visit | Attending: Internal Medicine

## 2013-10-31 DIAGNOSIS — Z87891 Personal history of nicotine dependence: Secondary | ICD-10-CM | POA: Insufficient documentation

## 2013-10-31 DIAGNOSIS — I6350 Cerebral infarction due to unspecified occlusion or stenosis of unspecified cerebral artery: Secondary | ICD-10-CM

## 2013-10-31 DIAGNOSIS — Z8673 Personal history of transient ischemic attack (TIA), and cerebral infarction without residual deficits: Secondary | ICD-10-CM | POA: Insufficient documentation

## 2013-10-31 DIAGNOSIS — Z0389 Encounter for observation for other suspected diseases and conditions ruled out: Secondary | ICD-10-CM | POA: Diagnosis not present

## 2013-10-31 DIAGNOSIS — E7800 Pure hypercholesterolemia, unspecified: Secondary | ICD-10-CM | POA: Insufficient documentation

## 2013-10-31 HISTORY — PX: LOOP RECORDER IMPLANT: SHX5477

## 2013-10-31 SURGERY — LOOP RECORDER IMPLANT
Anesthesia: LOCAL

## 2013-10-31 MED ORDER — LIDOCAINE-EPINEPHRINE 1 %-1:100000 IJ SOLN
INTRAMUSCULAR | Status: AC
  Filled 2013-10-31: qty 1

## 2013-10-31 NOTE — H&P (View-Only) (Signed)
ELECTROPHYSIOLOGY CONSULT NOTE  Patient ID: Edward Maynard MRN: [id number], DOB/AGE: 08-23-1933   Admit date: (Not on file) Date of Consult: 10/29/2013  Primary Physician: Simona Huh, MD Reason for Consultation: Cryptogenic stroke; recommendations regarding Implantable Loop Recorder  History of Present Illness Edward Maynard was admitted 4/15 with acute CVA. he has been monitored on telemetry which has demonstrated no arrhythmias. No cause has been identified.  He is now followed by Dr Erlinda Hong.  He wore a 30 day monitor which did not reveal any arrhythmias.  Given high suspicion for embolic source, he is referred for consideration of an implantable loop recorder.  EP has been asked to evaluate for placement of an implantable loop recorder to monitor for atrial fibrillation.  Past Medical History Past Medical History  Diagnosis Date  . Stroke 05/2013    MRI on 07/02/13 = Multifocal acute & subacute infarction involving right frontal MCA/ACA & left parietal MCA/PCA watershed areas  . High cholesterol     05/2013; no defecits  . Left arm weakness   . Left hand weakness   . Hyperlipemia     Past Surgical History Past Surgical History  Procedure Laterality Date  . Shoulder surgery Bilateral 2003, 1995    Allergies/Intolerances No Known Allergies Inpatient Medications   Social History History   Social History  . Marital Status: Married    Spouse Name: Edward Maynard    Number of Children: 2  . Years of Education: college   Occupational History  . retired    Social History Main Topics  . Smoking status: Former Smoker    Quit date: 03/20/1970  . Smokeless tobacco: Never Used  . Alcohol Use: No  . Drug Use: No  . Sexual Activity: Yes   Other Topics Concern  . Not on file   Social History Narrative   Patient right handed   Patient lives with his wife   Patient coffee daily   Enjoys playing golf    Review of Systems All systems reviewed and are otherwise negative except as  noted above.  Physical Exam Blood pressure 132/84, pulse 68, height 6' (1.829 m), weight 214 lb (97.07 kg).  General: Well developed, well appearing 80 y.o. male in no acute distress. HEENT: Normocephalic, atraumatic. EOMs intact. Sclera nonicteric. Oropharynx clear.  Neck: Supple without bruits. No JVD. Lungs: Respirations regular and unlabored, CTA bilaterally. No wheezes, rales or rhonchi. Heart: RRR. S1, S2 present. No murmurs, rub, S3 or S4. Abdomen: Soft, non-tender, non-distended. BS present x 4 quadrants. No hepatosplenomegaly.  Extremities: No clubbing, cyanosis or edema. DP/PT/Radials 2+ and equal bilaterally. Psych: Normal affect. Neuro: Alert and oriented X 3. Moves all extremities spontaneously. Musculoskeletal: No kyphosis. Skin: Intact. Warm and dry. No rashes or petechiae in exposed areas.   Labs Lab Results  Component Value Date   WBC 6.7 07/04/2013   HGB 15.3 07/04/2013   HCT 44.7 07/04/2013   MCV 96.5 07/04/2013   PLT 180 07/04/2013   No results found for this basename: NA, K, CL, CO2, BUN, CREATININE, CALCIUM, LABALBU, PROT, BILITOT, ALKPHOS, ALT, AST, GLUCOSE,  in the last 168 hours No results found for this basename: INR,  in the last 72 hours  Radiology/Studies reviewed  Echocardiogram  reviewed  12-lead ECG sinus rhythm, normal ekg 30 day monitor sinus with PVCs, no arrhythmias  Epic records including Dr Ericka Pontiff notes and hospital records are reviewed  Assessment and Plan:  1. Cryptogenic stroke The patient presents with cryptogenic stroke.  2d  echo revealed no structural heart disease.  Given that his stroke occurred 4-5 months ago, I do not think that a TEE would be beneficial at this point.  I do think that ILR implant could be helpful.  Our local data suggests that 18% of patients with cryptogenic stroke have afib detected on ILR within the first year of implant.  The mean time from stroke to detected afib is 50 days.   I spoke at length with the patient  about monitoring for afib with an implantable loop recorder.  Risks, benefits, and alteratives to implantable loop recorder were discussed with the patient today.   At this time, the patient is very clear in their decision to proceed with implantable loop recorder.   We will schedule implant at the next available time.  Remote follow-up was also discussed today.

## 2013-10-31 NOTE — CV Procedure (Signed)
SURGEON:  Thompson Grayer, MD     PREPROCEDURE DIAGNOSIS:  Cryptogenic Stroke    POSTPROCEDURE DIAGNOSIS:  Cryptogenic Stroke     PROCEDURES:   1. Implantable loop recorder implantation    INTRODUCTION:  Edward Maynard is a 80 y.o. male with a history of unexplained stroke who presents today for implantable loop implantation.  The patient has had a cryptogenic stroke.  Despite an extensive workup by neurology, no reversible causes have been identified.  he has worn telemetry during which he did not have arrhythmias.  There is significant concern for possible atrial fibrillation as the cause for the patients stroke.  The patient therefore presents today for implantable loop implantation.     DESCRIPTION OF PROCEDURE:  Informed written consent was obtained, and the patient was brought to the electrophysiology lab in a fasting state.  The patient required no sedation for the procedure today.  Mapping over the patient's chest was performed by the EP lab staff to identify the area where electrograms were most prominent for ILR recording.  This area was found to be the left parasternal region over the 3rd-4th intercostal space. The patients left chest was therefore prepped and draped in the usual sterile fashion by the EP lab staff. The skin overlying the left parasternal region was infiltrated with lidocaine for local analgesia.  A 0.5-cm incision was made over the left parasternal region over the 3rd intercostal space.  A subcutaneous ILR pocket was fashioned using a combination of sharp and blunt dissection.  A Medtronic Reveal [place] model [id number] implantable loop recorder was then placed into the pocket  R waves were very prominent and measured 0.78mV.  Steri- Strips and a sterile dressing were then applied.  There were no early apparent complications.     CONCLUSIONS:   1. Successful implantation of a Medtronic Reveal LINQ implantable loop recorder for cryptogenic stroke  2. No early apparent  complications.

## 2013-10-31 NOTE — Discharge Instructions (Signed)
Remove dressing in 48 hours May shower in 24 hours Return to office in 10 days.  Call office today to make appointment

## 2013-10-31 NOTE — Interval H&P Note (Signed)
History and Physical Interval Note:  10/31/2013 7:21 AM  [name]  has presented today for surgery, with the diagnosis of stroke  The various methods of treatment have been discussed with the patient and family. After consideration of risks, benefits and other options for treatment, the patient has consented to  Procedure(s): LOOP RECORDER IMPLANT (N/A) as a surgical intervention .  The patient's history has been reviewed, patient examined, no change in status, stable for surgery.  I have reviewed the patient's chart and labs.  Questions were answered to the patient's satisfaction.     Edward Maynard

## 2013-11-10 ENCOUNTER — Ambulatory Visit (INDEPENDENT_AMBULATORY_CARE_PROVIDER_SITE_OTHER): Payer: Medicare Other | Admitting: *Deleted

## 2013-11-10 DIAGNOSIS — I6350 Cerebral infarction due to unspecified occlusion or stenosis of unspecified cerebral artery: Secondary | ICD-10-CM

## 2013-11-10 DIAGNOSIS — I6359 Cerebral infarction due to unspecified occlusion or stenosis of other cerebral artery: Secondary | ICD-10-CM

## 2013-11-10 LAB — MDC_IDC_ENUM_SESS_TYPE_INCLINIC

## 2013-11-10 NOTE — Progress Notes (Signed)
Wound check ILR.  Steri strips removed, no redness or edema noted.  1 pause episode recorded shows undersensing.  R-wave 0.41mV.  Follow up via [facility].

## 2013-11-12 ENCOUNTER — Telehealth: Payer: Self-pay | Admitting: *Deleted

## 2013-11-12 NOTE — Telephone Encounter (Signed)
Pt called and is asking about if needs to keep appt with Dr. Rexene Alberts on 11-18-13.  (this is post stroke).  Has appt with Dr. Erlinda Hong on 12-12-13.  Does pt need to see you back Dr. Rexene Alberts?

## 2013-11-12 NOTE — Telephone Encounter (Signed)
Please advise patient that it is probably best if he continues to FU with one of our stroke specialists. Therefore, if ok with him, he will not need to see me on 11/18/13. If patient is agreeable, pls cancel appt.

## 2013-11-13 NOTE — Telephone Encounter (Signed)
I called pt and LMVM for him that per Dr Rexene Alberts ok to cancel 11-18-13 appt if ok with him.Marland Kitchen  Keep the stroke f/u with Dr. Erlinda Hong.  He is to call back if needed. I will cancel the appt with Dr. Rexene Alberts.

## 2013-11-18 ENCOUNTER — Ambulatory Visit: Payer: Medicare Other | Admitting: Neurology

## 2013-11-26 ENCOUNTER — Encounter: Payer: Self-pay | Admitting: Internal Medicine

## 2013-12-01 ENCOUNTER — Ambulatory Visit (INDEPENDENT_AMBULATORY_CARE_PROVIDER_SITE_OTHER): Payer: Medicare Other | Admitting: *Deleted

## 2013-12-01 DIAGNOSIS — I6359 Cerebral infarction due to unspecified occlusion or stenosis of other cerebral artery: Secondary | ICD-10-CM

## 2013-12-01 DIAGNOSIS — I6350 Cerebral infarction due to unspecified occlusion or stenosis of unspecified cerebral artery: Secondary | ICD-10-CM

## 2013-12-04 NOTE — Progress Notes (Signed)
Loop recorder 

## 2013-12-09 ENCOUNTER — Encounter: Payer: Self-pay | Admitting: Internal Medicine

## 2013-12-12 ENCOUNTER — Encounter: Payer: Self-pay | Admitting: Neurology

## 2013-12-12 ENCOUNTER — Encounter (INDEPENDENT_AMBULATORY_CARE_PROVIDER_SITE_OTHER): Payer: Self-pay

## 2013-12-12 ENCOUNTER — Ambulatory Visit (INDEPENDENT_AMBULATORY_CARE_PROVIDER_SITE_OTHER): Payer: Medicare Other | Admitting: Neurology

## 2013-12-12 VITALS — BP 140/78 | HR 66 | Wt 215.4 lb

## 2013-12-12 DIAGNOSIS — I6350 Cerebral infarction due to unspecified occlusion or stenosis of unspecified cerebral artery: Secondary | ICD-10-CM

## 2013-12-12 DIAGNOSIS — I639 Cerebral infarction, unspecified: Secondary | ICD-10-CM

## 2013-12-12 DIAGNOSIS — E785 Hyperlipidemia, unspecified: Secondary | ICD-10-CM | POA: Insufficient documentation

## 2013-12-12 NOTE — Patient Instructions (Signed)
-   continue ASA and zocor for stroke prevention - follow up with PCP for stroke risk factors - loop recorder monitoring. If Afib episode found, we need to consider stronger blood thinner for you. But now continue ASA. - follow up in 6 months.

## 2013-12-12 NOTE — Progress Notes (Addendum)
STROKE NEUROLOGY FOLLOW UP NOTE  NAME: Edward Maynard DOB: 12/27/1933  REASON FOR VISIT: stroke follow up HISTORY FROM: pt and his wife  Today we had the pleasure of seeing Edward Maynard in follow-up at our Neurology Clinic. Pt was accompanied by wife.   History Summary 80 yo M with PMH of DJD waking up on 05/26/13 with weakness of left arm and hand and incoordination. Went to see PCP-PA, considering cervical DJD and was prescribed with prednisone. For the following week, his weakness getting somewhat better but not resolved. One week later, he woke up again with worsening left arm and hand weakness, much worse, but also off balance, slowed down a lot and not able to drive. They went back to see PCP and had cervical MRI done and referred to see a neurosurgeon who recommend to have MRI brain done. Brain MRI done on 4/15 and it showed multifocal acute and subacute infarction involving right frontal MCA/ACA and left parietal MCA/PCA watershed areas. He was referred to see Dr. Rexene Alberts who sent pt to [facility] hospital for admission for stroke work up. MRA, TTE and CUS did not reveal cause of stroke and he was discharged with ASA and zocor as well as scheduled OP cardionet.   10/01/13 follow up - the patient has been doing well. No more episode of weakness and his left arm and hand are back to his baseline. He is back to golf court. His did cardionet monitoring for 30 days and no afib was found as per pt and his wife.   Interval History  During the interval time, pt continue to do well. Had loop recorder placed in 10/2013. No afib found so far. Pt more energetic. No recurrent symptoms. TCD emboli detection negative.  The following represents the patient's updated allergies and side effects list: No Known Allergies  Labs since last visit of relevance include the following: Results for orders placed in visit on 11/10/13  [id number]      Result Value Ref Range   Pulse Generator  Manufacturer Medtronic     Pulse Gen Model [id number] Reveal LINQ     Pulse Gen Serial Number [id number]     Eval Rhythm SR@65      Miscellaneous Comment       Value: Wound check ILR.  Steri strips removed, no redness or edema noted.  1 pause episode recorded shows undersensing.  R-wave 0.66mV.  Follow up via [facility]link.    The neurologically relevant items on the patient's problem list were reviewed on today's visit.  Neurologic Examination  A problem focused neurological exam (12 or more points of the single system neurologic examination, vital signs counts as 1 point, cranial nerves count for 8 points) was performed.  Blood pressure 140/78, pulse 66, weight 215 lb 6.4 oz (97.705 kg).  General - Well nourished, well developed, in no apparent distress.  Ophthalmologic - Sharp disc margins OU.  Cardiovascular - Regular rate and rhythm with no murmur.  Mental Status -  Level of arousal and orientation to time, place, and person were intact. Language including expression, naming, repetition, comprehension, reading, and writing was assessed and found intact. Attention span and concentration were normal. Recent and remote memory were intact. Fund of Knowledge was assessed and was intact.  Cranial Nerves II - XII - II - Vision intact OU. III, IV, VI - Extraocular movements intact. V - Facial sensation intact bilaterally. VII - Facial movement intact bilaterally. VIII - Hearing & vestibular intact bilaterally. X -  Palate elevates symmetrically. XI - Chin turning & shoulder shrug intact bilaterally. XII - Tongue protrusion intact.  Motor Strength - The patient's strength was normal in all extremities and pronator drift was absent.  Bulk was normal and fasciculations were absent.   Motor Tone - Muscle tone was assessed at the neck and appendages and was normal.  Reflexes - The patient's reflexes were normal in all extremities and he had no pathological reflexes.  Sensory - Light touch,  temperature/pinprick, vibration and proprioception, and Romberg testing were assessed and were normal.    Coordination - The patient had normal movements in the hands and feet with no ataxia or dysmetria.  Tremor was absent.  Gait and Station - The patient's transfers, posture, gait, station, and turns were observed as normal.  Data reviewed: I personally reviewed the images and agree with the radiology interpretations.  MRI of the brain 07/02/13 - multifocal areas of acute and subacute infarction in the supratentorial compartment, affecting left and right. This could represent hypotensive event or circulatory arrhythmia. MRA of the brain 07/05/2013 - Negative intracranial MRA, allowing for mild motion artifact.   2D Echocardiogram - ejection fraction 60-65%. No cardiac source of emboli identified.  Carotid Doppler Preliminary report: 1-39% ICA stenosis. Vertebral artery flow is antegrade.  EKG sinus rhythm rate 76 beats per minute. For complete results please see formal report.  TCD emboli - no MES for 30 min cardionet - no afib for 30 days Loop recorder - ongoing  Component     Latest Ref Rng 07/05/2013 10/01/2013  Total Protein     6.0 - 8.5 g/dL  6.9  Albumin     3.5 - 4.8 g/dL  4.4  Total Bilirubin     0.0 - 1.2 mg/dL  0.5  Bilirubin, Direct     0.00 - 0.40 mg/dL  0.11  Alkaline Phosphatase     39 - 117 IU/L  99  AST     0 - 40 IU/L  20  ALT     0 - 44 IU/L  23  Cholesterol     0 - 200 mg/dL 190   Triglycerides     <150 mg/dL 194 (H)   HDL     >39 mg/dL 36 (L)   Total CHOL/HDL Ratio      5.3   VLDL     0 - 40 mg/dL 39   LDL (calc)     0 - 99 mg/dL 115 (H)   Hemoglobin A1C     <5.7 % 5.8 (H)   Mean Plasma Glucose     <117 mg/dL 120 (H)   CK Total     24 - 204 U/L  112    Assessment: As you may recall, he is a 80 y.o.  Caucasian male with a diagnosis of embolic stroke. Pt recovered well from the stroke. MRI showed bilateral involvement, raising suspicion for  cardioembolic stroke. So far cardionet was negative, TCD emboli detection negative also, but due to high suspicion, loop recorder was placed in 10/2013. Continue ASA at 325mg  and increase zocor to 40mg . Unfortunately, he is not a candidate for RESPECT-ESUS trail since his stroke is more than 6 months ago.  Diagnoses from this visit: CVA (cerebral vascular accident)  Hyperlipidemia  Plan:  - continue ASA and zocor for stroke prevention - follow up with PCP for stroke risk factor modification - loop recorder monitoring - RTC in 6 months - Unfortunately, he is not a candidate for RESPECT-ESUS  trail since his stroke is more than 6 months ago.  Patient Instructions  - continue ASA and zocor for stroke prevention - follow up with PCP for stroke risk factors - loop recorder monitoring. If Afib episode found, we need to consider stronger blood thinner for you. But now continue ASA. - follow up in 6 months.     Rosalin Hawking, MD PhD [facility] [address], [place], [place] [address] [phone]

## 2013-12-16 LAB — MDC_IDC_ENUM_SESS_TYPE_REMOTE: MDC IDC SESS DTM: 20150917040500

## 2013-12-23 ENCOUNTER — Encounter: Payer: Self-pay | Admitting: Internal Medicine

## 2013-12-30 ENCOUNTER — Ambulatory Visit (INDEPENDENT_AMBULATORY_CARE_PROVIDER_SITE_OTHER): Payer: Medicare Other | Admitting: *Deleted

## 2013-12-30 DIAGNOSIS — I6359 Cerebral infarction due to unspecified occlusion or stenosis of other cerebral artery: Secondary | ICD-10-CM

## 2013-12-30 LAB — MDC_IDC_ENUM_SESS_TYPE_REMOTE: MDC IDC SESS DTM: 20151018040500

## 2013-12-31 NOTE — Progress Notes (Signed)
Loop recorder 

## 2014-01-22 ENCOUNTER — Encounter: Payer: Self-pay | Admitting: Internal Medicine

## 2014-01-23 ENCOUNTER — Encounter: Payer: Self-pay | Admitting: Internal Medicine

## 2014-01-29 ENCOUNTER — Ambulatory Visit (INDEPENDENT_AMBULATORY_CARE_PROVIDER_SITE_OTHER): Payer: Medicare Other | Admitting: *Deleted

## 2014-01-29 DIAGNOSIS — I6359 Cerebral infarction due to unspecified occlusion or stenosis of other cerebral artery: Secondary | ICD-10-CM

## 2014-02-03 ENCOUNTER — Other Ambulatory Visit: Payer: Self-pay | Admitting: Neurology

## 2014-02-03 LAB — MDC_IDC_ENUM_SESS_TYPE_REMOTE: Date Time Interrogation Session: 20151107050500

## 2014-02-04 NOTE — Progress Notes (Signed)
Loop recorder 

## 2014-02-08 ENCOUNTER — Other Ambulatory Visit: Payer: Self-pay | Admitting: Neurology

## 2014-02-09 NOTE — Telephone Encounter (Signed)
simvastatin (ZOCOR) 40 MG tablet 30 tablet 3 02/03/2014      Sig: TAKE 1 TABLET BY MOUTH EVERY DAY AT 6PM    E-Prescribing Status: Receipt confirmed by pharmacy (02/03/2014 7:44 AM EST)     Pharmacy    [id number] - [place], [place] - [place]

## 2014-02-16 ENCOUNTER — Encounter: Payer: Self-pay | Admitting: Internal Medicine

## 2014-02-17 ENCOUNTER — Encounter: Payer: Self-pay | Admitting: Internal Medicine

## 2014-02-26 ENCOUNTER — Encounter (HOSPITAL_COMMUNITY): Payer: Self-pay | Admitting: Internal Medicine

## 2014-02-27 ENCOUNTER — Ambulatory Visit (INDEPENDENT_AMBULATORY_CARE_PROVIDER_SITE_OTHER): Payer: Medicare Other | Admitting: *Deleted

## 2014-02-27 DIAGNOSIS — I6359 Cerebral infarction due to unspecified occlusion or stenosis of other cerebral artery: Secondary | ICD-10-CM

## 2014-03-04 NOTE — Progress Notes (Signed)
Loop recorder 

## 2014-03-17 ENCOUNTER — Encounter: Payer: Self-pay | Admitting: Internal Medicine

## 2014-03-23 ENCOUNTER — Encounter: Payer: Self-pay | Admitting: Internal Medicine

## 2014-03-25 LAB — MDC_IDC_ENUM_SESS_TYPE_REMOTE: MDC IDC SESS DTM: 20151212050500

## 2014-03-30 ENCOUNTER — Ambulatory Visit (INDEPENDENT_AMBULATORY_CARE_PROVIDER_SITE_OTHER): Payer: Medicare Other | Admitting: *Deleted

## 2014-03-30 DIAGNOSIS — I6359 Cerebral infarction due to unspecified occlusion or stenosis of other cerebral artery: Secondary | ICD-10-CM

## 2014-03-30 LAB — MDC_IDC_ENUM_SESS_TYPE_REMOTE: Date Time Interrogation Session: 20160113050500

## 2014-04-03 NOTE — Progress Notes (Signed)
Loop recorder 

## 2014-04-14 ENCOUNTER — Encounter: Payer: Self-pay | Admitting: Internal Medicine

## 2014-04-16 ENCOUNTER — Encounter: Payer: Self-pay | Admitting: Internal Medicine

## 2014-04-29 ENCOUNTER — Ambulatory Visit (INDEPENDENT_AMBULATORY_CARE_PROVIDER_SITE_OTHER): Payer: Medicare Other | Admitting: *Deleted

## 2014-04-29 DIAGNOSIS — I6359 Cerebral infarction due to unspecified occlusion or stenosis of other cerebral artery: Secondary | ICD-10-CM

## 2014-04-29 LAB — MDC_IDC_ENUM_SESS_TYPE_REMOTE: Date Time Interrogation Session: 20160124050500

## 2014-04-29 NOTE — Progress Notes (Signed)
Loop recorder 

## 2014-04-30 ENCOUNTER — Encounter: Payer: Self-pay | Admitting: Internal Medicine

## 2014-05-14 ENCOUNTER — Encounter: Payer: Self-pay | Admitting: Internal Medicine

## 2014-05-22 ENCOUNTER — Encounter: Payer: Self-pay | Admitting: Internal Medicine

## 2014-05-29 ENCOUNTER — Ambulatory Visit (INDEPENDENT_AMBULATORY_CARE_PROVIDER_SITE_OTHER): Payer: Medicare Other | Admitting: *Deleted

## 2014-05-29 DIAGNOSIS — I6359 Cerebral infarction due to unspecified occlusion or stenosis of other cerebral artery: Secondary | ICD-10-CM

## 2014-05-29 NOTE — Progress Notes (Signed)
Loop recorder 

## 2014-06-11 LAB — MDC_IDC_ENUM_SESS_TYPE_REMOTE

## 2014-06-12 ENCOUNTER — Ambulatory Visit: Payer: Medicare Other | Admitting: Neurology

## 2014-06-17 ENCOUNTER — Other Ambulatory Visit: Payer: Self-pay | Admitting: Neurology

## 2014-06-17 NOTE — Telephone Encounter (Signed)
Has appt scheduled.

## 2014-06-23 ENCOUNTER — Encounter: Payer: Self-pay | Admitting: Cardiovascular Disease

## 2014-06-29 ENCOUNTER — Ambulatory Visit (INDEPENDENT_AMBULATORY_CARE_PROVIDER_SITE_OTHER): Payer: Medicare Other | Admitting: *Deleted

## 2014-06-29 DIAGNOSIS — I6359 Cerebral infarction due to unspecified occlusion or stenosis of other cerebral artery: Secondary | ICD-10-CM

## 2014-06-30 NOTE — Progress Notes (Signed)
Loop recorder 

## 2014-07-10 ENCOUNTER — Encounter: Payer: Self-pay | Admitting: Neurology

## 2014-07-10 ENCOUNTER — Ambulatory Visit (INDEPENDENT_AMBULATORY_CARE_PROVIDER_SITE_OTHER): Payer: Medicare Other | Admitting: Neurology

## 2014-07-10 VITALS — BP 138/79 | HR 62 | Wt 211.2 lb

## 2014-07-10 DIAGNOSIS — E785 Hyperlipidemia, unspecified: Secondary | ICD-10-CM

## 2014-07-10 DIAGNOSIS — I6340 Cerebral infarction due to embolism of unspecified cerebral artery: Secondary | ICD-10-CM | POA: Insufficient documentation

## 2014-07-10 NOTE — Progress Notes (Signed)
STROKE NEUROLOGY FOLLOW UP NOTE  NAME: Edward Maynard DOB: [birth year]  REASON FOR VISIT: stroke follow up HISTORY FROM: pt and his wife  Today we had the pleasure of seeing Edward Maynard in follow-up at our Neurology Clinic. Pt was accompanied by wife.   History Summary 81 yo M with PMH of DJD waking up on 05/26/13 with weakness of left arm and hand and incoordination. Went to see PCP-PA, considering cervical DJD and was prescribed with prednisone. For the following week, his weakness getting somewhat better but not resolved. One week later, he woke up again with worsening left arm and hand weakness, much worse, but also off balance, slowed down a lot and not able to drive. They went back to see PCP and had cervical MRI done and referred to see a neurosurgeon who recommend to have MRI brain done. Brain MRI done on 4/15 and it showed multifocal acute and subacute infarction involving right frontal MCA/ACA and left parietal MCA/PCA watershed areas. He was referred to see Dr. Rexene Alberts who sent pt to [facility] hospital for admission for stroke work up. MRA, TTE and CUS did not reveal cause of stroke and he was discharged with ASA and zocor as well as scheduled OP cardionet.   10/01/13 follow up - the patient has been doing well. No more episode of weakness and his left arm and hand are back to his baseline. He is back to golf court. His did cardionet monitoring for 30 days and no afib was found as per pt and his wife.   12/12/13 follow up - pt continue to do well. Had loop recorder placed in 10/2013. No afib found so far. Pt more energetic. No recurrent symptoms. TCD emboli detection negative.  Interval History  During the interval time, pt continued to be doing well. Loop recorder did not show afib episode, but some NSR with PVCs or noise. No recurrent symptoms. Still on ASA and zocor, but sometimes taking 20mg  zocor due to tiredness. Will see PCP next month for 6 months follow up.  The following represents  the patient's updated allergies and side effects list: No Known Allergies  Labs since last visit of relevance include the following: Results for orders placed or performed in visit on 05/29/14  Implantable device - remote  Result Value Ref Range   Implantable Pulse Generator Manufacturer Medtronic    Implantable Pulse Generator Model [id number] Reveal [facility]    Implantable Pulse Generator Serial Number [id number]    Eval Rhythm SR    Miscellaneous Comment      Carelink summary report received. Battery status OK. Normal device function. No symptom episodes, tachy episodes, brady or pause episodes. No AF episodes. Monthly summary reports and PRN with JA.    The neurologically relevant items on the patient's problem list were reviewed on today's visit.  Neurologic Examination  A problem focused neurological exam (12 or more points of the single system neurologic examination, vital signs counts as 1 point, cranial nerves count for 8 points) was performed.  Blood pressure 138/79, pulse 62, weight 211 lb 3.2 oz (95.8 kg).  General - Well nourished, well developed, in no apparent distress.  Ophthalmologic - Sharp disc margins OU.  Cardiovascular - Regular rate and rhythm with no murmur.  Mental Status -  Level of arousal and orientation to time, place, and person were intact. Language including expression, naming, repetition, comprehension, reading, and writing was assessed and found intact. Attention span and concentration were normal. Recent and  remote memory were intact. Fund of Knowledge was assessed and was intact.  Cranial Nerves II - XII - II - Vision intact OU. III, IV, VI - Extraocular movements intact. V - Facial sensation intact bilaterally. VII - Facial movement intact bilaterally. VIII - Hearing & vestibular intact bilaterally. X - Palate elevates symmetrically. XI - Chin turning & shoulder shrug intact bilaterally. XII - Tongue protrusion intact.  Motor Strength - The  patient's strength was normal in all extremities and pronator drift was absent.  Bulk was normal and fasciculations were absent.   Motor Tone - Muscle tone was assessed at the neck and appendages and was normal.  Reflexes - The patient's reflexes were normal in all extremities and he had no pathological reflexes.  Sensory - Light touch, temperature/pinprick, vibration and proprioception, and Romberg testing were assessed and were normal.    Coordination - The patient had normal movements in the hands and feet with no ataxia or dysmetria.  Tremor was absent.  Gait and Station - The patient's transfers, posture, gait, station, and turns were observed as normal.  Data reviewed: I personally reviewed the images and agree with the radiology interpretations.  MRI of the brain 07/02/13 - multifocal areas of acute and subacute infarction in the supratentorial compartment, affecting left and right. This could represent hypotensive event or circulatory arrhythmia. MRA of the brain 07/05/2013 - Negative intracranial MRA, allowing for mild motion artifact.   2D Echocardiogram - ejection fraction 60-65%. No cardiac source of emboli identified.  Carotid Doppler Preliminary report: 1-39% ICA stenosis. Vertebral artery flow is antegrade.  EKG sinus rhythm rate 76 beats per minute. For complete results please see formal report.  TCD emboli - no MES for 30 min cardionet - no afib for 30 days Loop recorder - so far no afib episodes.  Component     Latest Ref Rng 07/05/2013 10/01/2013  Total Protein     6.0 - 8.5 g/dL  6.9  Albumin     3.5 - 4.8 g/dL  4.4  Total Bilirubin     0.0 - 1.2 mg/dL  0.5  Bilirubin, Direct     0.00 - 0.40 mg/dL  0.11  Alkaline Phosphatase     39 - 117 IU/L  99  AST     0 - 40 IU/L  20  ALT     0 - 44 IU/L  23  Cholesterol     0 - 200 mg/dL 190   Triglycerides     <150 mg/dL 194 (H)   HDL     >39 mg/dL 36 (L)   Total CHOL/HDL Ratio      5.3   VLDL     0 - 40 mg/dL 39    LDL (calc)     0 - 99 mg/dL 115 (H)   Hemoglobin A1C     <5.7 % 5.8 (H)   Mean Plasma Glucose     <117 mg/dL 120 (H)   CK Total     24 - 204 U/L  112    Assessment: As you may recall, he is a 81 y.o.  Caucasian male with a diagnosis of embolic stroke. Pt recovered well from the stroke. MRI showed bilateral involvement, raising suspicion for cardioembolic stroke. So far cardionet was negative, TCD emboli detection negative also, but due to high suspicion, loop recorder was placed in 10/2013. However, so far no afib found. Continue ASA at 325mg  and zocor 40mg . He is not a candidate for RESPECT-ESUS trail  since his stroke is more than 6 months ago.  Diagnoses from this visit: No diagnosis found.  Plan:  - continue ASA and zocor for stroke prevention - Follow up with primary care physician for stroke risk factor modification. Recommend maintain blood pressure goal <130/80, diabetes with hemoglobin A1c goal below 6.5% and lipids with LDL cholesterol goal below 70 mg/dL.  - loop recorder monitoring - RTC in 6 months  Patient Instructions  - continue ASA and zocor for stroke prevention - Follow up with your primary care physician for stroke risk factor modification. Recommend maintain blood pressure goal <130/80, diabetes with hemoglobin A1c goal below 6.5% and lipids with LDL cholesterol goal below 70 mg/dL.  - continue the loop recorder monitoring. - follow up in 6 months.     Rosalin Hawking, MD PhD [facility] [address] [place], [place] [address] [phone]

## 2014-07-10 NOTE — Patient Instructions (Signed)
-   continue ASA and zocor for stroke prevention - Follow up with your primary care physician for stroke risk factor modification. Recommend maintain blood pressure goal <130/80, diabetes with hemoglobin A1c goal below 6.5% and lipids with LDL cholesterol goal below 70 mg/dL.  - continue the loop recorder monitoring. - follow up in 6 months.

## 2014-07-17 ENCOUNTER — Telehealth: Payer: Self-pay | Admitting: Nurse Practitioner

## 2014-07-17 NOTE — Telephone Encounter (Signed)
-----   Message from Thompson Grayer, MD sent at 07/16/2014  8:48 PM EDT ----- Regarding: patient w afib and prior stroke Afib detected (30 second episode) on ILR 07/13/14.  Lue Sykora, please call and initiate anticoagulation Schedule follow-up with cardiology [facility] to discuss afib next week. V rates during afib are very fast (>150 bpm)  Would therefore initiate rate control with diltiazem also.

## 2014-07-17 NOTE — Telephone Encounter (Signed)
Left message for patient to call back.  He will need to be started on anticoagulation with new AF detected on LINQ and stop ASA when anticoagulation started.  He will also need to be seen in [facility] next week for BMET and to be sure he is tolerating anticoagulation and further discuss AF. With RVR during AF episode, Dr Rayann Heman would also like for him to start on Diltiazem 120mg  daily.    I am out of the office this afternoon and off next week so will forward message to triage to follow up.  Chanetta Marshall, NP 07/17/2014 1:55 PM

## 2014-07-20 ENCOUNTER — Telehealth: Payer: Self-pay | Admitting: Neurology

## 2014-07-20 NOTE — Telephone Encounter (Signed)
Follow up      Returning  [name] call from friday

## 2014-07-20 NOTE — Telephone Encounter (Signed)
Pt was found to have 30 sec afib episode on 07/13/14. Talked with pt over the phone and he is going to see Dr. Rayann Heman tomorrow 10:30am to discuss about anticoagulation. I encouraged him to keep the appointment. His questions are answered to his satisfaction.   Rosalin Hawking, MD PhD Stroke Neurology 07/20/2014 4:51 PM

## 2014-07-20 NOTE — Telephone Encounter (Signed)
Left message on his voicemail that I would have Melissa call him to scheduled an appointment with [facility] to discuss anticoagulation(BMP) and starting new Diltiazem for rate control of his afib.

## 2014-07-21 ENCOUNTER — Encounter: Payer: Self-pay | Admitting: Cardiology

## 2014-07-21 ENCOUNTER — Ambulatory Visit (INDEPENDENT_AMBULATORY_CARE_PROVIDER_SITE_OTHER): Payer: Medicare Other | Admitting: Cardiology

## 2014-07-21 VITALS — BP 138/80 | HR 63 | Ht 72.0 in | Wt 210.0 lb

## 2014-07-21 DIAGNOSIS — I639 Cerebral infarction, unspecified: Secondary | ICD-10-CM | POA: Diagnosis not present

## 2014-07-21 DIAGNOSIS — I6340 Cerebral infarction due to embolism of unspecified cerebral artery: Secondary | ICD-10-CM

## 2014-07-21 DIAGNOSIS — I480 Paroxysmal atrial fibrillation: Secondary | ICD-10-CM

## 2014-07-21 DIAGNOSIS — E785 Hyperlipidemia, unspecified: Secondary | ICD-10-CM

## 2014-07-21 DIAGNOSIS — I4891 Unspecified atrial fibrillation: Secondary | ICD-10-CM

## 2014-07-21 LAB — BASIC METABOLIC PANEL
BUN: 15 mg/dL (ref 6–23)
CO2: 30 mEq/L (ref 19–32)
Calcium: 9.6 mg/dL (ref 8.4–10.5)
Chloride: 102 mEq/L (ref 96–112)
Creatinine, Ser: 1.16 mg/dL (ref 0.40–1.50)
GFR: 64.66 mL/min (ref >60.00)
Glucose, Bld: 100 mg/dL — ABNORMAL HIGH (ref 70–99)
Potassium: 3.8 mEq/L (ref 3.5–5.1)
Sodium: 137 mEq/L (ref 135–145)

## 2014-07-21 LAB — CBC WITH DIFFERENTIAL/PLATELET
Basophils Absolute: 0 10*3/uL (ref 0.0–0.1)
Basophils Relative: 0.3 % (ref 0.0–3.0)
Eosinophils Absolute: 0.2 10*3/uL (ref 0.0–0.7)
Eosinophils Relative: 3.1 % (ref 0.0–5.0)
HCT: 46.7 % (ref 39.0–52.0)
Hemoglobin: 15.9 g/dL (ref 13.0–17.0)
Lymphocytes Relative: 24.4 % (ref 12.0–46.0)
Lymphs Abs: 1.6 10*3/uL (ref 0.7–4.0)
MCHC: 34.1 g/dL (ref 30.0–36.0)
MCV: 95.6 fl (ref 78.0–100.0)
Monocytes Absolute: 0.7 10*3/uL (ref 0.1–1.0)
Monocytes Relative: 10.2 % (ref 3.0–12.0)
Neutro Abs: 4.1 10*3/uL (ref 1.4–7.7)
Neutrophils Relative %: 62 % (ref 43.0–77.0)
Platelets: 186 10*3/uL (ref 150.0–400.0)
RBC: 4.89 Mil/uL (ref 4.22–5.81)
RDW: 13.9 % (ref 11.5–15.5)
WBC: 6.7 10*3/uL (ref 4.0–10.5)

## 2014-07-21 LAB — TSH: TSH: 1.51 u[IU]/mL (ref 0.35–4.50)

## 2014-07-21 MED ORDER — RIVAROXABAN 20 MG PO TABS
20.0000 mg | ORAL_TABLET | Freq: Every day | ORAL | Status: DC
Start: 2014-07-21 — End: 2014-09-16

## 2014-07-21 MED ORDER — DILTIAZEM HCL ER COATED BEADS 120 MG PO CP24: 120.0000 mg | ORAL_CAPSULE | Freq: Every day | ORAL | Status: DC

## 2014-07-21 NOTE — Patient Instructions (Addendum)
Medication Instructions:  STOP ASPRIN   START TAKING DILTIAZEM 120 MG ONCE A DAY   START XERALTO 20 MG ONCE A DAY WITH SUPPER   Labwork: CBC BMP AND TSH   Testing/Procedures: NONE TODAY   Follow-Up:  WITH  DR Rayann Heman IN 3 MONTHS   Any Other Special Instructions Will Be Listed Below (If Applicable).

## 2014-07-21 NOTE — Assessment & Plan Note (Signed)
Documented with Loop recorder

## 2014-07-21 NOTE — Assessment & Plan Note (Signed)
April 2015- no residual

## 2014-07-21 NOTE — Assessment & Plan Note (Signed)
On statin Rx 

## 2014-07-21 NOTE — Progress Notes (Signed)
    07/21/2014 Edward Maynard   03-18-1934  [id number]  Primary Physician Simona Huh, MD Primary Cardiologist: Dr Rayann Heman  HPI:  81 y/o active male, seen by Dr Rayann Heman in August 2015 for evaluation of stroke. The pt had had an apparent cryptogenic stroke in April 2015. A Loop recorder was placed 10/31/13 and recently the pt had documented PAF with RVR. He is seen in the office today for follow up. He denies any symptoms of tachycardia.    Current Outpatient Prescriptions  Medication Sig Dispense Refill  . aspirin 325 MG tablet Take 325 mg by mouth daily.    . fluocinonide (LIDEX) 0.05 % external solution Apply 1 application topically 2 (two) times daily.   3  . simvastatin (ZOCOR) 40 MG tablet TAKE 1 TABLET BY MOUTH EVERY DAY AT 6PM 30 tablet 0  . diltiazem (CARDIZEM CD) 120 MG 24 hr capsule Take 1 capsule (120 mg total) by mouth daily. 30 capsule 6  . rivaroxaban (XARELTO) 20 MG TABS tablet Take 1 tablet (20 mg total) by mouth daily with supper. 30 tablet 6   No current facility-administered medications for this visit.    No Known Allergies  History   Social History  . Marital Status: Married    Spouse Name: Edward Maynard  . Number of Children: 2  . Years of Education: college   Occupational History  . retired    Social History Main Topics  . Smoking status: Former Smoker    Quit date: 03/20/1970  . Smokeless tobacco: Never Used  . Alcohol Use: No  . Drug Use: No  . Sexual Activity: Yes   Other Topics Concern  . Not on file   Social History Narrative   Patient right handed   Patient lives with his wife   Patient coffee daily   Enjoys playing golf     Review of Systems: General: negative for chills, fever, night sweats or weight changes.  Cardiovascular: negative for chest pain, dyspnea on exertion, edema, orthopnea, palpitations, paroxysmal nocturnal dyspnea or shortness of breath Dermatological: negative for rash Respiratory: negative for cough or wheezing Urologic:  negative for hematuria Abdominal: negative for nausea, vomiting, diarrhea, bright red blood per rectum, melena, or hematemesis Neurologic: negative for visual changes, syncope, or dizziness All other systems reviewed and are otherwise negative except as noted above.    Blood pressure 138/80, pulse 63, height 6' (1.829 m), weight 210 lb (95.255 kg).  General appearance: alert, cooperative and no distress Neck: no carotid bruit, no JVD and thyroid not enlarged, symmetric, no tenderness/mass/nodules Lungs: clear to auscultation bilaterally Heart: regular rate and rhythm Abdomen: soft, non-tender; bowel sounds normal; no masses,  no organomegaly Extremities: extremities normal, atraumatic, no cyanosis or edema Pulses: 2+ and symmetric Skin: Skin color, texture, turgor normal. No rashes or lesions Neurologic: Grossly normal  EKG NSR, PR 200  ASSESSMENT AND PLAN:   PAF (paroxysmal atrial fibrillation) Documented with Loop recorder   Hyperlipidemia On statin Rx   Cerebral infarction due to embolism of cerebral artery April 2015- no residual     PLAN  Discussed with pharmacist today. Will add Xarelto 20 mg daily and stop ASA. Labs ordered and we will adjust dose if needed for renal function but no indication he has CRI. I also added Diltiazem CD 120 mg. He will f/u with Dr Rayann Heman as an OP.   Edward Maynard KPA-C 07/21/2014 11:55 AM

## 2014-07-21 NOTE — Assessment & Plan Note (Deleted)
April 2015- no residual

## 2014-07-23 ENCOUNTER — Telehealth: Payer: Self-pay

## 2014-07-23 NOTE — Telephone Encounter (Signed)
Fax received from [facility] with approval for Xarelto 20mg  daily thru 07/23/2015. PA # [id number].

## 2014-07-23 NOTE — Telephone Encounter (Signed)
Prior auth for Xarelto sent to [employer].

## 2014-07-23 NOTE — Telephone Encounter (Signed)
Prior auth submitted to [facility] thru [employer] for Xarelto 20mg .

## 2014-07-25 ENCOUNTER — Other Ambulatory Visit: Payer: Self-pay | Admitting: Neurology

## 2014-07-28 ENCOUNTER — Encounter: Payer: Self-pay | Admitting: Internal Medicine

## 2014-07-28 ENCOUNTER — Ambulatory Visit (INDEPENDENT_AMBULATORY_CARE_PROVIDER_SITE_OTHER): Payer: Medicare Other | Admitting: *Deleted

## 2014-07-28 DIAGNOSIS — I639 Cerebral infarction, unspecified: Secondary | ICD-10-CM | POA: Diagnosis not present

## 2014-07-28 LAB — CUP PACEART REMOTE DEVICE CHECK: Date Time Interrogation Session: 20160426040500

## 2014-07-31 NOTE — Progress Notes (Signed)
Loop recorder 

## 2014-08-03 ENCOUNTER — Other Ambulatory Visit: Payer: Self-pay | Admitting: Neurology

## 2014-08-04 NOTE — Telephone Encounter (Signed)
Duplicate

## 2014-08-18 LAB — CUP PACEART REMOTE DEVICE CHECK: Date Time Interrogation Session: 20160525040500

## 2014-08-20 ENCOUNTER — Encounter: Payer: Self-pay | Admitting: Internal Medicine

## 2014-08-24 ENCOUNTER — Other Ambulatory Visit: Payer: Self-pay

## 2014-08-24 MED ORDER — DILTIAZEM HCL ER COATED BEADS 120 MG PO CP24: 120.0000 mg | ORAL_CAPSULE | Freq: Every day | ORAL | Status: DC

## 2014-08-27 ENCOUNTER — Ambulatory Visit (INDEPENDENT_AMBULATORY_CARE_PROVIDER_SITE_OTHER): Payer: Medicare Other | Admitting: *Deleted

## 2014-08-27 DIAGNOSIS — I639 Cerebral infarction, unspecified: Secondary | ICD-10-CM

## 2014-08-27 NOTE — Progress Notes (Signed)
Loop recorder 

## 2014-08-31 ENCOUNTER — Encounter: Payer: Self-pay | Admitting: Internal Medicine

## 2014-09-01 ENCOUNTER — Telehealth: Payer: Self-pay | Admitting: Neurology

## 2014-09-01 NOTE — Telephone Encounter (Signed)
Recommend patient discuss with the physician who started Cardizem to consider using alternative medication which does not interact with simvastatin

## 2014-09-01 NOTE — Telephone Encounter (Signed)
Edward Maynard with [facility] is calling to see if the doctor is aware when the patient is taking diltiazem 120 mg that the recommended FDA dosage of Simvastatin is 10 mg. Call back # ref [phone]. Please call.

## 2014-09-01 NOTE — Telephone Encounter (Signed)
[facility] is calling regarding Rx for Simvastatin 40mg  and Diltiazem 120mg .  They state the recommended dose for Simvastatin combined with Diltiazem is 10mg .  Patient has been on Zocor since 07/06/2013.  It appears he was just started on Cardizem on 07/21/14.  Questioning if it is okay to proceed with Zocor 40mg  or if dose should be adjusted.  Dr Erlinda Hong is out of the office.  Dr Leonie Man, would you kindly provide your recommendation?  Thank you.

## 2014-09-01 NOTE — Telephone Encounter (Signed)
I called back.  Spoke with pharmacist.  Relayed providers message.  They will reach out to the patient/other provider regarding this. I called the patient.  Explained the situation.  He verbalized understanding.

## 2014-09-09 LAB — CUP PACEART REMOTE DEVICE CHECK: Date Time Interrogation Session: 20160608040500

## 2014-09-15 ENCOUNTER — Encounter: Payer: Self-pay | Admitting: Internal Medicine

## 2014-09-15 ENCOUNTER — Telehealth: Payer: Self-pay | Admitting: Internal Medicine

## 2014-09-15 NOTE — Telephone Encounter (Signed)
Walk in pt form-pt wants Xalerto through [place] to [place].

## 2014-09-16 ENCOUNTER — Other Ambulatory Visit: Payer: Self-pay

## 2014-09-16 MED ORDER — RIVAROXABAN 20 MG PO TABS: 20.0000 mg | ORAL_TABLET | Freq: Every day | ORAL | Status: DC

## 2014-09-23 ENCOUNTER — Encounter: Payer: Self-pay | Admitting: Internal Medicine

## 2014-09-25 ENCOUNTER — Ambulatory Visit (INDEPENDENT_AMBULATORY_CARE_PROVIDER_SITE_OTHER): Payer: Medicare Other | Admitting: *Deleted

## 2014-09-25 DIAGNOSIS — I639 Cerebral infarction, unspecified: Secondary | ICD-10-CM | POA: Diagnosis not present

## 2014-09-25 LAB — CUP PACEART REMOTE DEVICE CHECK: Date Time Interrogation Session: 20160804040500

## 2014-09-29 NOTE — Progress Notes (Signed)
<  HTML><META HTTP-EQUIV="content-type" CONTENT="text/html;charset=utf-8"><A [email or web address] target="_blank">[email or web address]</A> .

## 2014-10-19 ENCOUNTER — Encounter: Payer: Self-pay | Admitting: Internal Medicine

## 2014-10-19 ENCOUNTER — Ambulatory Visit (INDEPENDENT_AMBULATORY_CARE_PROVIDER_SITE_OTHER): Payer: Medicare Other | Admitting: Internal Medicine

## 2014-10-19 VITALS — BP 132/80 | HR 59 | Ht 72.0 in | Wt 209.4 lb

## 2014-10-19 DIAGNOSIS — I480 Paroxysmal atrial fibrillation: Secondary | ICD-10-CM | POA: Diagnosis not present

## 2014-10-19 LAB — CBC WITH DIFFERENTIAL/PLATELET
BASOS PCT: 0.6 % (ref 0.0–3.0)
Basophils Absolute: 0 10*3/uL (ref 0.0–0.1)
EOS ABS: 0.2 10*3/uL (ref 0.0–0.7)
Eosinophils Relative: 2.1 % (ref 0.0–5.0)
HCT: 46 % (ref 39.0–52.0)
HEMOGLOBIN: 15.4 g/dL (ref 13.0–17.0)
LYMPHS ABS: 1.7 10*3/uL (ref 0.7–4.0)
Lymphocytes Relative: 23.2 % (ref 12.0–46.0)
MCHC: 33.5 g/dL (ref 30.0–36.0)
MCV: 97.8 fl (ref 78.0–100.0)
Monocytes Absolute: 0.7 10*3/uL (ref 0.1–1.0)
Monocytes Relative: 9.9 % (ref 3.0–12.0)
NEUTROS ABS: 4.6 10*3/uL (ref 1.4–7.7)
Neutrophils Relative %: 64.2 % (ref 43.0–77.0)
PLATELETS: 180 10*3/uL (ref 150.0–400.0)
RBC: 4.71 Mil/uL (ref 4.22–5.81)
RDW: 14.7 % (ref 11.5–15.5)
WBC: 7.2 10*3/uL (ref 4.0–10.5)

## 2014-10-19 LAB — CUP PACEART INCLINIC DEVICE CHECK
MDC IDC SESS DTM: 20160801140002
MDC IDC SET ZONE DETECTION INTERVAL: 2000 ms
MDC IDC SET ZONE DETECTION INTERVAL: 3000 ms
MDC IDC SET ZONE DETECTION INTERVAL: 390 ms

## 2014-10-19 LAB — BASIC METABOLIC PANEL
BUN: 14 mg/dL (ref 6–23)
CO2: 29 meq/L (ref 19–32)
CREATININE: 1.16 mg/dL (ref 0.40–1.50)
Calcium: 9.3 mg/dL (ref 8.4–10.5)
Chloride: 104 mEq/L (ref 96–112)
GFR: 64.61 mL/min (ref >60.00)
Glucose, Bld: 93 mg/dL (ref 70–99)
POTASSIUM: 4 meq/L (ref 3.5–5.1)
SODIUM: 139 meq/L (ref 135–145)

## 2014-10-19 NOTE — Progress Notes (Signed)
PCP: Simona Huh, MD  Edward Maynard is a 81 y.o. male who presents today for routine electrophysiology followup.  Since last being seen in our clinic, the patient reports doing very well.  He was seen by Kerin Ransom 5/16 after ILR revealed afib.  He was started on xarelto which he is tolerating well. Today, he denies symptoms of palpitations, chest pain, shortness of breath,  lower extremity edema, dizziness, presyncope, or syncope.  The patient is otherwise without complaint today.   Past Medical History  Diagnosis Date  . Stroke 05/2013    MRI on 07/02/13 = Multifocal acute & subacute infarction involving right frontal MCA/ACA & left parietal MCA/PCA watershed areas  . High cholesterol     05/2013; no defecits  . Left arm weakness   . Left hand weakness   . Hyperlipemia   . Personal history of colonic polyp - adenoma  10/23/2013   Past Surgical History  Procedure Laterality Date  . Shoulder surgery Bilateral 2003, 1995  . Loop recorder implant N/A 10/31/2013    Procedure: LOOP RECORDER IMPLANT;  Surgeon: Coralyn Mark, MD;  Location: [facility];  Service: Cardiovascular;  Laterality: N/A;    ROS- all systems are reviewed and negative except as per HPI above  Current Outpatient Prescriptions  Medication Sig Dispense Refill  . diltiazem (CARDIZEM CD) 120 MG 24 hr capsule Take 1 capsule (120 mg total) by mouth daily. 90 capsule 3  . rivaroxaban (XARELTO) 20 MG TABS tablet Take 1 tablet (20 mg total) by mouth daily with supper. 90 tablet 1  . simvastatin (ZOCOR) 40 MG tablet TAKE 1 TABLET BY MOUTH EVERY DAY AT 6PM 30 tablet 5  . fluocinonide (LIDEX) 0.05 % external solution Apply 1 application topically 2 (two) times daily.   3   No current facility-administered medications for this visit.    Physical Exam: Filed Vitals:   10/19/14 1112  BP: 132/80  Pulse: 59  Height: 6' (1.829 m)  Weight: 94.983 kg (209 lb 6.4 oz)    GEN- The patient is well appearing, alert and oriented x  3 today.   Head- normocephalic, atraumatic Eyes-  Sclera clear, conjunctiva pink Ears- hearing intact Oropharynx- clear Lungs- Clear to ausculation bilaterally, normal work of breathing Heart- Regular rate and rhythm, no murmurs, rubs or gallops, PMI not laterally displaced GI- soft, NT, ND, + BS Extremities- no clubbing, cyanosis, or edema  ILR interrogation- reviewed in detail today,  See PACEART report  Assessment and Plan:  1. AF Documented on ILR He has also had SVT Currently asymptomatic with arrhythmias Appropriately anticoagulated with xarelto  No changes today Continue remote monitoring Follow-up with EP NP in 1year Check cbc and bmet today

## 2014-10-19 NOTE — Patient Instructions (Signed)
Medication Instructions:  Your physician recommends that you continue on your current medications as directed. Please refer to the Current Medication list given to you today.   Labwork: Your physician recommends that you return for lab work today: CBC/BMP   Testing/Procedures: None ordered  Follow-Up:   Your physician wants you to follow-up in: 12 months with Chanetta Marshall, NP  You will receive a reminder letter in the mail two months in advance. If you don't receive a letter, please call our office to schedule the follow-up appointment.   Any Other Special Instructions Will Be Listed Below (If Applicable).

## 2014-10-26 ENCOUNTER — Ambulatory Visit (INDEPENDENT_AMBULATORY_CARE_PROVIDER_SITE_OTHER): Payer: Medicare Other | Admitting: *Deleted

## 2014-10-26 DIAGNOSIS — I6340 Cerebral infarction due to embolism of unspecified cerebral artery: Secondary | ICD-10-CM

## 2014-10-27 NOTE — Progress Notes (Signed)
Loop recorder 

## 2014-10-30 LAB — CUP PACEART REMOTE DEVICE CHECK: MDC IDC SESS DTM: 20160812161146

## 2014-11-04 ENCOUNTER — Encounter: Payer: Self-pay | Admitting: Internal Medicine

## 2014-11-06 ENCOUNTER — Encounter: Payer: Self-pay | Admitting: Internal Medicine

## 2014-11-10 ENCOUNTER — Telehealth: Payer: Self-pay | Admitting: *Deleted

## 2014-11-10 DIAGNOSIS — I493 Ventricular premature depolarization: Secondary | ICD-10-CM

## 2014-11-10 NOTE — Telephone Encounter (Signed)
[facility] requesting call back.  Patient needs to send a manual transmission.

## 2014-11-11 NOTE — Telephone Encounter (Signed)
Patient returned call.  Patient states he is at work but will attempt to send manual transmission either today or tomorrow.  Patient appreciative of call and denies any additional questions or concerns at this time.  Patient aware to call with any worsening symptoms.

## 2014-11-20 NOTE — Telephone Encounter (Signed)
[facility] requesting call back

## 2014-11-20 NOTE — Telephone Encounter (Signed)
Called patient and explained that after reviewing his transmission with Dr. Rayann Heman, an echocardiogram was recommended as his last echo was more than a year ago.  Patient verbalized understanding.  Order for echo placed.  Explained that echo scheduling would be contacting him.  Patient appreciative of call and denies any questions or concerns at this time.  Will route to scheduler for a follow-up appointment with Dr. Rayann Heman after echo as well.

## 2014-11-25 ENCOUNTER — Encounter: Payer: Self-pay | Admitting: Internal Medicine

## 2014-11-25 ENCOUNTER — Ambulatory Visit (INDEPENDENT_AMBULATORY_CARE_PROVIDER_SITE_OTHER): Payer: Medicare Other | Admitting: *Deleted

## 2014-11-25 DIAGNOSIS — I6340 Cerebral infarction due to embolism of unspecified cerebral artery: Secondary | ICD-10-CM

## 2014-11-26 NOTE — Progress Notes (Signed)
Loop recorder 

## 2014-12-07 ENCOUNTER — Other Ambulatory Visit: Payer: Self-pay

## 2014-12-07 ENCOUNTER — Ambulatory Visit (HOSPITAL_COMMUNITY): Payer: Medicare Other | Attending: Cardiology

## 2014-12-07 DIAGNOSIS — Z79899 Other long term (current) drug therapy: Secondary | ICD-10-CM

## 2014-12-07 DIAGNOSIS — I493 Ventricular premature depolarization: Secondary | ICD-10-CM | POA: Diagnosis not present

## 2014-12-07 DIAGNOSIS — Z87891 Personal history of nicotine dependence: Secondary | ICD-10-CM | POA: Diagnosis not present

## 2014-12-07 DIAGNOSIS — I071 Rheumatic tricuspid insufficiency: Secondary | ICD-10-CM | POA: Diagnosis not present

## 2014-12-07 DIAGNOSIS — I480 Paroxysmal atrial fibrillation: Secondary | ICD-10-CM | POA: Insufficient documentation

## 2014-12-07 DIAGNOSIS — I517 Cardiomegaly: Secondary | ICD-10-CM | POA: Diagnosis not present

## 2014-12-07 DIAGNOSIS — I77810 Thoracic aortic ectasia: Secondary | ICD-10-CM | POA: Diagnosis not present

## 2014-12-07 DIAGNOSIS — I351 Nonrheumatic aortic (valve) insufficiency: Secondary | ICD-10-CM | POA: Insufficient documentation

## 2014-12-07 DIAGNOSIS — I340 Nonrheumatic mitral (valve) insufficiency: Secondary | ICD-10-CM | POA: Insufficient documentation

## 2014-12-14 ENCOUNTER — Ambulatory Visit (INDEPENDENT_AMBULATORY_CARE_PROVIDER_SITE_OTHER): Payer: Medicare Other | Admitting: Internal Medicine

## 2014-12-14 ENCOUNTER — Encounter: Payer: Self-pay | Admitting: Internal Medicine

## 2014-12-14 VITALS — BP 114/70 | HR 76 | Ht 72.0 in | Wt 207.1 lb

## 2014-12-14 DIAGNOSIS — I480 Paroxysmal atrial fibrillation: Secondary | ICD-10-CM | POA: Diagnosis not present

## 2014-12-14 DIAGNOSIS — I472 Ventricular tachycardia: Secondary | ICD-10-CM | POA: Diagnosis not present

## 2014-12-14 DIAGNOSIS — I493 Ventricular premature depolarization: Secondary | ICD-10-CM | POA: Diagnosis not present

## 2014-12-14 DIAGNOSIS — I4729 Other ventricular tachycardia: Secondary | ICD-10-CM | POA: Insufficient documentation

## 2014-12-14 LAB — CUP PACEART INCLINIC DEVICE CHECK
MDC IDC SESS DTM: 20160926125354
MDC IDC SET ZONE DETECTION INTERVAL: 390 ms
Zone Setting Detection Interval: 2000 ms
Zone Setting Detection Interval: 3000 ms

## 2014-12-14 MED ORDER — METOPROLOL SUCCINATE ER 25 MG PO TB24: 25.0000 mg | ORAL_TABLET | Freq: Every day | ORAL | Status: DC

## 2014-12-14 NOTE — Patient Instructions (Signed)
Medication Instructions: 1) Stop diltiazem 2) Start Toprol (metoprolol succinate) 25 mg once daily  Labwork: - none  Procedures/Testing: - none  Follow-Up: - Your physician recommends that you schedule a follow-up appointment in: 2 months with Chanetta Marshall, NP for Dr. Rayann Heman.  Any Additional Special Instructions Will Be Listed Below (If Applicable).

## 2014-12-14 NOTE — Progress Notes (Signed)
PCP: Simona Huh, MD  Edward Maynard is a 81 y.o. male who presents today for routine electrophysiology followup.  Since last being seen in our clinic, the patient reports doing very well.  His ILR transmissions have revealed frequent PVCs and nonsustained VT.  He is asymptomatic with this.  Echo was performed which revealed preserved EF.  His energy is "great".  Today, he denies symptoms of palpitations, chest pain, shortness of breath,  lower extremity edema, dizziness, presyncope, or syncope.  The patient is otherwise without complaint today.   Past Medical History  Diagnosis Date  . Stroke 05/2013    MRI on 07/02/13 = Multifocal acute & subacute infarction involving right frontal MCA/ACA & left parietal MCA/PCA watershed areas  . High cholesterol     05/2013; no defecits  . Left arm weakness   . Left hand weakness   . Hyperlipemia   . Personal history of colonic polyp - adenoma  10/23/2013   Past Surgical History  Procedure Laterality Date  . Shoulder surgery Bilateral 2003, 1995  . Loop recorder implant N/A 10/31/2013    Procedure: LOOP RECORDER IMPLANT;  Surgeon: Coralyn Mark, MD;  Location: [facility];  Service: Cardiovascular;  Laterality: N/A;    ROS- all systems are reviewed and negative except as per HPI above  Current Outpatient Prescriptions  Medication Sig Dispense Refill  . rivaroxaban (XARELTO) 20 MG TABS tablet Take 1 tablet (20 mg total) by mouth daily with supper. 90 tablet 1  . simvastatin (ZOCOR) 40 MG tablet TAKE 1 TABLET BY MOUTH EVERY DAY AT 6PM 30 tablet 5  . metoprolol succinate (TOPROL-XL) 25 MG 24 hr tablet Take 1 tablet (25 mg total) by mouth daily. 90 tablet 3   No current facility-administered medications for this visit.    Physical Exam: Filed Vitals:   12/14/14 1209  BP: 114/70  Pulse: 76  Height: 6' (1.829 m)  Weight: 207 lb 1.9 oz (93.949 kg)    GEN- The patient is well appearing, alert and oriented x 3 today.   Head- normocephalic,  atraumatic Eyes-  Sclera clear, conjunctiva pink Ears- hearing intact Oropharynx- clear Lungs- Clear to ausculation bilaterally, normal work of breathing Heart- Regular rate and rhythm, no murmurs, rubs or gallops, PMI not laterally displaced GI- soft, NT, ND, + BS Extremities- no clubbing, cyanosis, or edema  ILR interrogation- reviewed in detail today,  See PACEART report ekg is ordered and reviewed today  Assessment and Plan:  1. AF Stable No change required today Appropriately anticoagulated with xarelto  2. PVCs/ NSVT Asymptomatic Stop diltiazem and start low dose toprol  Continue remote monitoring Follow-up with EP NP in 2 months and then yearly thereafter I will see when needed going forward  Thompson Grayer MD, [facility] 12/14/2014 10:13 PM

## 2014-12-21 ENCOUNTER — Encounter: Payer: Self-pay | Admitting: Internal Medicine

## 2014-12-25 ENCOUNTER — Ambulatory Visit (INDEPENDENT_AMBULATORY_CARE_PROVIDER_SITE_OTHER): Payer: Medicare Other | Admitting: *Deleted

## 2014-12-25 DIAGNOSIS — I6340 Cerebral infarction due to embolism of unspecified cerebral artery: Secondary | ICD-10-CM

## 2014-12-25 LAB — CUP PACEART REMOTE DEVICE CHECK: MDC IDC SESS DTM: 20160907143715

## 2014-12-25 NOTE — Progress Notes (Signed)
Loop recorder 

## 2014-12-25 NOTE — Progress Notes (Signed)
Carelink summary report received. Battery status OK. Normal device function. No new symptom episodes, tachy episodes, brady, or pause episodes. 5 AF episodes, 0.0% + xarelto. Monthly summary reports and ROV w/ JA 12/14/14.

## 2014-12-29 ENCOUNTER — Other Ambulatory Visit: Payer: Self-pay | Admitting: Internal Medicine

## 2014-12-29 DIAGNOSIS — I493 Ventricular premature depolarization: Secondary | ICD-10-CM

## 2014-12-29 MED ORDER — METOPROLOL SUCCINATE ER 25 MG PO TB24: 25.0000 mg | ORAL_TABLET | Freq: Every day | ORAL | Status: DC

## 2015-01-05 LAB — CUP PACEART REMOTE DEVICE CHECK: MDC IDC SESS DTM: 20161007150601

## 2015-01-05 NOTE — Progress Notes (Signed)
Carelink summary report received. Battery status OK. Normal device function. No new symptom episodes, tachy episodes, brady, or pause episodes. No new AF episodes. Monthly summary reports and ROV with AS on 02/22/15 at 11:30am.

## 2015-01-18 ENCOUNTER — Encounter: Payer: Self-pay | Admitting: Neurology

## 2015-01-18 ENCOUNTER — Ambulatory Visit (INDEPENDENT_AMBULATORY_CARE_PROVIDER_SITE_OTHER): Payer: Medicare Other | Admitting: Neurology

## 2015-01-18 VITALS — BP 127/75 | HR 60 | Ht 72.0 in | Wt 210.0 lb

## 2015-01-18 DIAGNOSIS — I6340 Cerebral infarction due to embolism of unspecified cerebral artery: Secondary | ICD-10-CM

## 2015-01-18 DIAGNOSIS — I480 Paroxysmal atrial fibrillation: Secondary | ICD-10-CM | POA: Insufficient documentation

## 2015-01-18 DIAGNOSIS — E785 Hyperlipidemia, unspecified: Secondary | ICD-10-CM | POA: Diagnosis not present

## 2015-01-18 DIAGNOSIS — Z7901 Long term (current) use of anticoagulants: Secondary | ICD-10-CM | POA: Diagnosis not present

## 2015-01-18 NOTE — Patient Instructions (Addendum)
-   continue Xarelto and zocor for stroke prevention - continue follow up with cardiology for afib management - check BP at home, may need calibrate BP device. - Follow up with your primary care physician for stroke risk factor modification. Recommend maintain blood pressure goal <130/80, diabetes with hemoglobin A1c goal below 6.5% and lipids with LDL cholesterol goal below 70 mg/dL.  - continue the loop recorder monitoring. - follow up in 6 months.

## 2015-01-18 NOTE — Progress Notes (Signed)
STROKE NEUROLOGY FOLLOW UP NOTE  NAME: Edward Maynard DOB: 03/29/1933  REASON FOR VISIT: stroke follow up HISTORY FROM: pt and his wife  Today we had the pleasure of seeing Edward Maynard in follow-up at our Neurology Clinic. Pt was accompanied by wife.   History Summary 81 yo M with PMH of DJD waking up on 05/26/13 with weakness of left arm and hand and incoordination. Went to see PCP-PA, considering cervical DJD and was prescribed with prednisone. For the following week, his weakness getting somewhat better but not resolved. One week later, he woke up again with worsening left arm and hand weakness, much worse, but also off balance, slowed down a lot and not able to drive. They went back to see PCP and had cervical MRI done and referred to see a neurosurgeon who recommend to have MRI brain done. Brain MRI done on 4/15 and it showed multifocal acute and subacute infarction involving right frontal MCA/ACA and left parietal MCA/PCA watershed areas. He was referred to see Dr. Rexene Alberts who sent pt to [facility] hospital for admission for stroke work up. MRA, TTE and CUS did not reveal cause of stroke and he was discharged with ASA and zocor as well as scheduled OP cardionet.   10/01/13 follow up - the patient has been doing well. No more episode of weakness and his left arm and hand are back to his baseline. He is back to golf court. His did cardionet monitoring for 30 days and no afib was found as per pt and his wife.   12/12/13 follow up - pt continue to do well. Had loop recorder placed in 10/2013. No afib found so far. Pt more energetic. No recurrent symptoms. TCD emboli detection negative.  07/10/14 follow up - pt continued to be doing well. Loop recorder did not show afib episode, but some NSR with PVCs or noise. No recurrent symptoms. Still on ASA and zocor, but sometimes taking 20mg  zocor due to tiredness. Will see PCP next month for 6 months follow up.  Interval History  During the interval time, pt has  been doing well. Loop recorder found pt had pAfib episode. Consistent with his stroke etiology. He has been put on Xarelto for stroke prevention. No side effect of bleeding. He initially was put on cardizam for rate control but recent changed to metoprolol due to SVT found on loop recorder. He is tolerating meds well. No complains. BP today 127/75.  The following represents the patient's updated allergies and side effects list: No Known Allergies  Labs since last visit of relevance include the following: Results for orders placed or performed in visit on 12/25/14  Implantable device - remote  Result Value Ref Range   Date Time Interrogation Session [id number]    Pulse Generator Manufacturer MERM    Pulse Gen Model [id number] Reveal LINQ    Pulse Gen Serial Number [id number]    Implantable Pulse Generator Type ICM/ILR    Implantable Pulse Generator Implant Date [id number]    Eval Rhythm SR     The neurologically relevant items on the patient's problem list were reviewed on today's visit.  Neurologic Examination  A problem focused neurological exam (12 or more points of the single system neurologic examination, vital signs counts as 1 point, cranial nerves count for 8 points) was performed.  Blood pressure 127/75, pulse 60, height 6' (1.829 m), weight 210 lb (95.255 kg).  General - Well nourished, well developed, in no apparent distress.  Ophthalmologic -  Sharp disc margins OU.  Cardiovascular - Regular rate and rhythm with no murmur.  Mental Status -  Level of arousal and orientation to time, place, and person were intact. Language including expression, naming, repetition, comprehension, reading, and writing was assessed and found intact. Attention span and concentration were normal. Recent and remote memory were intact. Fund of Knowledge was assessed and was intact.  Cranial Nerves II - XII - II - Vision intact OU. III, IV, VI - Extraocular movements intact. V -  Facial sensation intact bilaterally. VII - Facial movement intact bilaterally. VIII - Hearing & vestibular intact bilaterally. X - Palate elevates symmetrically. XI - Chin turning & shoulder shrug intact bilaterally. XII - Tongue protrusion intact.  Motor Strength - The patient's strength was normal in all extremities and pronator drift was absent.  Bulk was normal and fasciculations were absent.   Motor Tone - Muscle tone was assessed at the neck and appendages and was normal.  Reflexes - The patient's reflexes were normal in all extremities and he had no pathological reflexes.  Sensory - Light touch, temperature/pinprick, vibration and proprioception, and Romberg testing were assessed and were normal.    Coordination - The patient had normal movements in the hands and feet with no ataxia or dysmetria.  Tremor was absent.  Gait and Station - The patient's transfers, posture, gait, station, and turns were observed as normal.  Data reviewed: I personally reviewed the images and agree with the radiology interpretations.  MRI of the brain 07/02/13 - multifocal areas of acute and subacute infarction in the supratentorial compartment, affecting left and right. This could represent hypotensive event or circulatory arrhythmia. MRA of the brain 07/05/2013 - Negative intracranial MRA, allowing for mild motion artifact.   2D Echocardiogram - ejection fraction 60-65%. No cardiac source of emboli identified.  Carotid Doppler Preliminary report: 1-39% ICA stenosis. Vertebral artery flow is antegrade.  EKG sinus rhythm rate 76 beats per minute. For complete results please see formal report.  TCD emboli - no MES for 30 min cardionet - no afib for 30 days Loop recorder - proxysmal afib episodes found, put on AC.  Component     Latest Ref Rng 07/05/2013 10/01/2013  Total Protein     6.0 - 8.5 g/dL  6.9  Albumin     3.5 - 4.8 g/dL  4.4  Total Bilirubin     0.0 - 1.2 mg/dL  0.5  Bilirubin, Direct      0.00 - 0.40 mg/dL  0.11  Alkaline Phosphatase     39 - 117 IU/L  99  AST     0 - 40 IU/L  20  ALT     0 - 44 IU/L  23  Cholesterol     0 - 200 mg/dL 190   Triglycerides     <150 mg/dL 194 (H)   HDL     >39 mg/dL 36 (L)   Total CHOL/HDL Ratio      5.3   VLDL     0 - 40 mg/dL 39   LDL (calc)     0 - 99 mg/dL 115 (H)   Hemoglobin A1C     <5.7 % 5.8 (H)   Mean Plasma Glucose     <117 mg/dL 120 (H)   CK Total     24 - 204 U/L  112    Assessment: As you may recall, he is a 81 y.o.  Caucasian male with a diagnosis of embolic stroke. Pt recovered  well from the stroke. MRI showed bilateral involvement, raising suspicion for cardioembolic stroke. So far cardionet was negative, TCD emboli detection negative also, but due to high suspicion, loop recorder was placed in 10/2013. In 07/13/2014, he was found to have Afib episode, and he was put on Xarelto by cardiology. Currently also on metoprolol for rate control.   Plan:  - continue Xarelto and zocor for stroke prevention - continue follow up with cardiology for afib management - check BP at home. - Follow up with your primary care physician for stroke risk factor modification. Recommend maintain blood pressure goal <130/80, diabetes with hemoglobin A1c goal below 6.5% and lipids with LDL cholesterol goal below 70 mg/dL.  - follow up in 6 months.   Patient Instructions  - continue Xarelto and zocor for stroke prevention - continue follow up with cardiology for afib management - check BP at home, may need calibrate BP device. - Follow up with your primary care physician for stroke risk factor modification. Recommend maintain blood pressure goal <130/80, diabetes with hemoglobin A1c goal below 6.5% and lipids with LDL cholesterol goal below 70 mg/dL.  - continue the loop recorder monitoring. - follow up in 6 months.     Rosalin Hawking, MD PhD [facility] [address], [place], [place] [address] [phone]

## 2015-01-25 ENCOUNTER — Ambulatory Visit (INDEPENDENT_AMBULATORY_CARE_PROVIDER_SITE_OTHER): Payer: Medicare Other | Admitting: *Deleted

## 2015-01-25 DIAGNOSIS — I6340 Cerebral infarction due to embolism of unspecified cerebral artery: Secondary | ICD-10-CM

## 2015-01-25 NOTE — Progress Notes (Signed)
Carelink Summary Report / Loop recorder 

## 2015-01-29 ENCOUNTER — Encounter: Payer: Self-pay | Admitting: Internal Medicine

## 2015-02-11 ENCOUNTER — Other Ambulatory Visit: Payer: Self-pay | Admitting: Neurology

## 2015-02-11 ENCOUNTER — Other Ambulatory Visit: Payer: Self-pay | Admitting: Internal Medicine

## 2015-02-21 LAB — CUP PACEART REMOTE DEVICE CHECK: Date Time Interrogation Session: 20161106153548

## 2015-02-21 NOTE — Progress Notes (Signed)
Carelink summary report received. Battery status OK. Normal device function. No new symptom episodes, tachy episodes, brady, or pause episodes. 4 AF episodes (burden 0.0%), +Xarelto and metoprolol, ECGs suggest SR w/frequent PVCs rather than AF. Monthly summary reports and ROV with AS on 02/24/15 at 1:40pm.

## 2015-02-22 ENCOUNTER — Encounter: Payer: Medicare Other | Admitting: Nurse Practitioner

## 2015-02-23 ENCOUNTER — Encounter: Payer: Medicare Other | Admitting: *Deleted

## 2015-02-24 ENCOUNTER — Ambulatory Visit (INDEPENDENT_AMBULATORY_CARE_PROVIDER_SITE_OTHER): Payer: Medicare Other | Admitting: Nurse Practitioner

## 2015-02-24 ENCOUNTER — Encounter: Payer: Self-pay | Admitting: Nurse Practitioner

## 2015-02-24 VITALS — BP 130/70 | HR 69 | Ht 72.0 in | Wt 213.0 lb

## 2015-02-24 DIAGNOSIS — I493 Ventricular premature depolarization: Secondary | ICD-10-CM | POA: Diagnosis not present

## 2015-02-24 DIAGNOSIS — I480 Paroxysmal atrial fibrillation: Secondary | ICD-10-CM | POA: Diagnosis not present

## 2015-02-24 NOTE — Progress Notes (Signed)
Electrophysiology Office Note Date: 02/24/2015  ID:  Edward, Maynard February 16, 1934, MRN [id number]  PCP: Simona Huh, MD Electrophysiologist: Rayann Heman  CC: atrial fibrillation/PVC follow up  Edward Maynard is a 81 y.o. male seen today for Dr Rayann Heman.  He underwent ILR implantation for cryptogenic stroke and atrial fibrillation was identified. He was started on [facility] as well as Diltiazem for elevated V rates.  Remote ILR interrogations subsequently demonstrated frequent ventricular ectopy and NSVT for which he was asymptomatic. Echo demonstrated normal LVEF.  He was seen by Dr Rayann Heman and Diltiazem discontinued and he was initiated on Toprol.  He presents today for routine electrophysiology followup.  Since last being seen in our clinic, the patient reports doing very well.  His only new complaint is of occasional stool incontinence.  He has had no other change in bowel habits, no dark or tarry stools.  He denies chest pain, palpitations, dyspnea, PND, orthopnea, nausea, vomiting, dizziness, syncope, edema, weight gain, or early satiety.  Echo 11/2014 demonstrated EF 0000000, grade 1 diastolic dysfunction, LA 47  Device History: MDT ILR implanted 10/2013 for cryptogenic stroke  Past Medical History  Diagnosis Date  . Stroke [facility]) 05/2013    MRI on 07/02/13 = Multifocal acute & subacute infarction involving right frontal MCA/ACA & left parietal MCA/PCA watershed areas  . High cholesterol     05/2013; no defecits  . Left arm weakness   . Left hand weakness   . Hyperlipemia   . Personal history of colonic polyp - adenoma  10/23/2013   Past Surgical History  Procedure Laterality Date  . Shoulder surgery Bilateral 2003, 1995  . Loop recorder implant N/A 10/31/2013    Procedure: LOOP RECORDER IMPLANT;  Surgeon: Coralyn Mark, MD;  Location: [place] CATH LAB;  Service: Cardiovascular;  Laterality: N/A;    Current Outpatient Prescriptions  Medication Sig Dispense Refill  . metoprolol succinate  (TOPROL-XL) 25 MG 24 hr tablet Take 1 tablet (25 mg total) by mouth daily. 90 tablet 3  . simvastatin (ZOCOR) 40 MG tablet Take 1 tablet by mouth  daily at 6 pm 90 tablet 1  . XARELTO 20 MG TABS tablet Take 1 tablet by mouth  daily with supper 90 tablet 0   No current facility-administered medications for this visit.    Allergies:   Review of patient's allergies indicates no known allergies.   Social History: Social History   Social History  . Marital Status: Married    Spouse Name: Edward Maynard  . Number of Children: 2  . Years of Education: college   Occupational History  . retired    Social History Main Topics  . Smoking status: Former Smoker    Quit date: 03/20/1970  . Smokeless tobacco: Never Used  . Alcohol Use: No  . Drug Use: No  . Sexual Activity: Yes   Other Topics Concern  . Not on file   Social History Narrative   Patient right handed   Patient lives with his wife   Patient coffee daily   Enjoys playing golf    Family History: Family History  Problem Relation Age of Onset  . Hypertension Mother   . Hypertension Father   . Stroke Father   . COPD Brother   . Colon cancer Neg Hx   . Heart attack Neg Hx   . Stroke Brother     Review of Systems: All other systems reviewed and are otherwise negative except as noted above.   Physical  Exam: VS:  BP 130/70 mmHg  Pulse 69  Ht 6' (1.829 m)  Wt 213 lb (96.616 kg)  BMI 28.88 kg/m2  SpO2 94% , BMI Body mass index is 28.88 kg/(m^2). Wt Readings from Last 3 Encounters:  02/24/15 213 lb (96.616 kg)  01/18/15 210 lb (95.255 kg)  12/14/14 207 lb 1.9 oz (93.949 kg)    GEN- The patient is well appearing, alert and oriented x 3 today.   HEENT: normocephalic, atraumatic; sclera clear, conjunctiva pink; hearing intact; oropharynx clear; neck supple, no JVP Lymph- no cervical lymphadenopathy Lungs- Clear to ausculation bilaterally, normal work of breathing.  No wheezes, rales, rhonchi Heart- Regular rate and  rhythm GI- soft, non-tender, non-distended, bowel sounds present, no hepatosplenomegaly Extremities- no clubbing, cyanosis, or edema; DP/PT/radial pulses 2+ bilaterally MS- no significant deformity or atrophy Skin- warm and dry, no rash or lesion  Psych- euthymic mood, full affect Neuro- strength and sensation are intact   EKG:  EKG is not ordered today.  Recent Labs: 07/21/2014: TSH 1.51 10/19/2014: BUN 14; Creatinine, Ser 1.16; Hemoglobin 15.4; Platelets 180.0; Potassium 4.0; Sodium 139    Other studies Reviewed: Additional studies/ records that were reviewed today include: Dr Jackalyn Lombard office notes, echo  Assessment and Plan: 1.  Paroxysmal atrial fibrillation Asymptomatic Burden <0.1% by device interrogation today, V rates elevated but AF burden is low and patient asymptomatic. No changes at this time Continue Xarelto for [facility] of at least 4 BMET, CBC with PCP  2. PVC's, NSVT Asymptomatic Normal EF by echo 11/2014 Burden improved with changing diltiazem to toprol No changes today   Current medicines are reviewed at length with the patient today.   The patient does not have concerns regarding his medicines.  The following changes were made today:  none  Labs/ tests ordered today include: none   Disposition:   Follow up with me in 6 months   Signed, Chanetta Marshall, NP 02/24/2015 2:10 PM   [address] [place] [address] [phone] (office) [phone] (fax)

## 2015-02-24 NOTE — Patient Instructions (Signed)
Medication Instructions:   Your physician recommends that you continue on your current medications as directed. Please refer to the Current Medication list given to you today.    If you need a refill on your cardiac medications before your next appointment, please call your pharmacy.  Labwork: NONE ORDER TODAY    Testing/Procedures: NONE ORDER TODAY    Follow-Up: Your physician wants you to follow-up in:  IN  [place] will receive a reminder letter in the mail two months in advance. If you don't receive a letter, please call our office to schedule the follow-up appointment.      Any Other Special Instructions Will Be Listed Below (If Applicable).

## 2015-03-25 ENCOUNTER — Ambulatory Visit (INDEPENDENT_AMBULATORY_CARE_PROVIDER_SITE_OTHER): Payer: Medicare Other | Admitting: *Deleted

## 2015-03-25 DIAGNOSIS — I6340 Cerebral infarction due to embolism of unspecified cerebral artery: Secondary | ICD-10-CM | POA: Diagnosis not present

## 2015-03-26 NOTE — Progress Notes (Signed)
Carelink Summary Report / Loop Recorder 

## 2015-04-26 ENCOUNTER — Ambulatory Visit (INDEPENDENT_AMBULATORY_CARE_PROVIDER_SITE_OTHER): Payer: Medicare Other | Admitting: *Deleted

## 2015-04-26 DIAGNOSIS — I6340 Cerebral infarction due to embolism of unspecified cerebral artery: Secondary | ICD-10-CM

## 2015-04-27 NOTE — Progress Notes (Signed)
Carelink Summary Report / Loop Recorder 

## 2015-05-06 LAB — CUP PACEART REMOTE DEVICE CHECK: Date Time Interrogation Session: 20170105153848

## 2015-05-06 NOTE — Progress Notes (Signed)
Carelink summary report received. Battery status OK. Normal device function. No new symptom episodes, tachy episodes, brady, or pause episodes. No new AF episodes. Monthly summary reports and ROV/PRN 

## 2015-05-14 ENCOUNTER — Other Ambulatory Visit: Payer: Self-pay

## 2015-05-14 ENCOUNTER — Other Ambulatory Visit: Payer: Self-pay | Admitting: Internal Medicine

## 2015-05-14 MED ORDER — RIVAROXABAN 20 MG PO TABS: ORAL_TABLET | ORAL | Status: DC

## 2015-05-24 ENCOUNTER — Ambulatory Visit (INDEPENDENT_AMBULATORY_CARE_PROVIDER_SITE_OTHER): Payer: Medicare Other | Admitting: *Deleted

## 2015-05-24 DIAGNOSIS — I6340 Cerebral infarction due to embolism of unspecified cerebral artery: Secondary | ICD-10-CM | POA: Diagnosis not present

## 2015-05-28 NOTE — Progress Notes (Signed)
Carelink Summary Report / Loop Recorder 

## 2015-05-29 LAB — CUP PACEART REMOTE DEVICE CHECK: Date Time Interrogation Session: 20170306163739

## 2015-05-29 NOTE — Progress Notes (Signed)
Carelink summary report received. Battery status OK. Normal device function. No new symptom episodes, brady, or pause episodes. No new AF episodes. 2 tachy episodes, +Xarelto.  Monthly summary reports and ROV/PRN

## 2015-06-14 ENCOUNTER — Other Ambulatory Visit: Payer: Self-pay | Admitting: Neurology

## 2015-06-16 ENCOUNTER — Other Ambulatory Visit: Payer: Self-pay

## 2015-06-16 MED ORDER — SIMVASTATIN 40 MG PO TABS: ORAL_TABLET | ORAL | Status: DC

## 2015-06-23 ENCOUNTER — Ambulatory Visit (INDEPENDENT_AMBULATORY_CARE_PROVIDER_SITE_OTHER): Payer: Medicare Other | Admitting: *Deleted

## 2015-06-23 DIAGNOSIS — I6340 Cerebral infarction due to embolism of unspecified cerebral artery: Secondary | ICD-10-CM

## 2015-06-24 NOTE — Progress Notes (Signed)
Carelink Summary Report / Loop Recorder 

## 2015-07-05 LAB — CUP PACEART REMOTE DEVICE CHECK: Date Time Interrogation Session: 20170204160758

## 2015-07-05 NOTE — Progress Notes (Signed)
Carelink summary report received. Battery status OK. Normal device function. No new symptom episodes, brady, or pause episodes. No new AF episodes. 2 tachy- ECGs appear slightly irregular VS with occasional PVCs, no symptoms reported per EPIC. Monthly summary reports and ROV/PRN

## 2015-07-19 ENCOUNTER — Telehealth: Payer: Self-pay | Admitting: Internal Medicine

## 2015-07-19 ENCOUNTER — Encounter: Payer: Self-pay | Admitting: Neurology

## 2015-07-19 ENCOUNTER — Ambulatory Visit (INDEPENDENT_AMBULATORY_CARE_PROVIDER_SITE_OTHER): Payer: Medicare Other | Admitting: Neurology

## 2015-07-19 VITALS — BP 110/65 | HR 70 | Ht 72.0 in | Wt 218.2 lb

## 2015-07-19 DIAGNOSIS — Z7901 Long term (current) use of anticoagulants: Secondary | ICD-10-CM

## 2015-07-19 DIAGNOSIS — I480 Paroxysmal atrial fibrillation: Secondary | ICD-10-CM

## 2015-07-19 DIAGNOSIS — E785 Hyperlipidemia, unspecified: Secondary | ICD-10-CM

## 2015-07-19 DIAGNOSIS — I6340 Cerebral infarction due to embolism of unspecified cerebral artery: Secondary | ICD-10-CM

## 2015-07-19 NOTE — Progress Notes (Signed)
STROKE NEUROLOGY FOLLOW UP NOTE  NAME: Edward Maynard DOB: Nov 04, 1933  REASON FOR VISIT: stroke follow up HISTORY FROM: pt and his wife  Today we had the pleasure of seeing Edward Maynard in follow-up at our Neurology Clinic. Pt was accompanied by wife.   History Summary 82 yo M with PMH of DJD waking up on 05/26/13 with weakness of left arm and hand and incoordination. Went to see PCP-PA, considering cervical DJD and was prescribed with prednisone. For the following week, his weakness getting somewhat better but not resolved. One week later, he woke up again with worsening left arm and hand weakness, much worse, but also off balance, slowed down a lot and not able to drive. They went back to see PCP and had cervical MRI done and referred to see a neurosurgeon who recommend to have MRI brain done. Brain MRI done on 4/15 and it showed multifocal acute and subacute infarction involving right frontal MCA/ACA and left parietal MCA/PCA watershed areas. He was referred to see Dr. Rexene Alberts who sent pt to [facility] hospital for admission for stroke work up. MRA, TTE and CUS did not reveal cause of stroke and he was discharged with ASA and zocor as well as scheduled OP cardionet.   10/01/13 follow up - the patient has been doing well. No more episode of weakness and his left arm and hand are back to his baseline. He is back to golf court. His did cardionet monitoring for 30 days and no afib was found as per pt and his wife.   12/12/13 follow up - pt continue to do well. Had loop recorder placed in 10/2013. No afib found so far. Pt more energetic. No recurrent symptoms. TCD emboli detection negative.  07/10/14 follow up - pt continued to be doing well. Loop recorder did not show afib episode, but some NSR with PVCs or noise. No recurrent symptoms. Still on ASA and zocor, but sometimes taking 20mg  zocor due to tiredness. Will see PCP next month for 6 months follow up.  01/18/15 follow up - pt has been doing well. Loop  recorder found pt had pAfib episode. Consistent with his stroke etiology. He has been put on Xarelto for stroke prevention. No side effect of bleeding. He initially was put on cardizam for rate control but recent changed to metoprolol due to SVT found on loop recorder. He is tolerating meds well. No complains. BP today 127/75.  Interval History During the interval time, pt has been doing well. No stroke like symptoms or bleeding complications. On Xarelto and zocor. Complains of leg tiredness after playing golf 2-3 time a week. Has followed with PCP and cardiology without issue. BP today 110/65 and HR 70. Also on metoprolol.  The following represents the patient's updated allergies and side effects list: No Known Allergies  Labs since last visit of relevance include the following: Results for orders placed or performed in visit on 05/24/15  Implantable device - remote  Result Value Ref Range   Date Time Interrogation Session [id number]    Pulse Generator Manufacturer MERM    Pulse Gen Model [id number] Reveal LINQ    Pulse Gen Serial Number [id number]    Implantable Pulse Generator Type ICM/ILR    Implantable Pulse Generator Implant Date [id number]    Eval Rhythm SR     The neurologically relevant items on the patient's problem list were reviewed on today's visit.  Neurologic Examination  A problem focused neurological exam (12 or more points of  the single system neurologic examination, vital signs counts as 1 point, cranial nerves count for 8 points) was performed.  Blood pressure 110/65, pulse 70, height 6' (1.829 m), weight 218 lb 3.2 oz (98.975 kg).  General - Well nourished, well developed, in no apparent distress.  Ophthalmologic - Sharp disc margins OU.  Cardiovascular - Regular rate and rhythm with no murmur.  Mental Status -  Level of arousal and orientation to time, place, and person were intact. Language including expression, naming, repetition, comprehension,  reading, and writing was assessed and found intact. Attention span and concentration were normal. Recent and remote memory were intact. Fund of Knowledge was assessed and was intact.  Cranial Nerves II - XII - II - Vision intact OU. III, IV, VI - Extraocular movements intact. V - Facial sensation intact bilaterally. VII - Facial movement intact bilaterally. VIII - Hearing & vestibular intact bilaterally. X - Palate elevates symmetrically. XI - Chin turning & shoulder shrug intact bilaterally. XII - Tongue protrusion intact.  Motor Strength - The patient's strength was normal in all extremities and pronator drift was absent.  Bulk was normal and fasciculations were absent.   Motor Tone - Muscle tone was assessed at the neck and appendages and was normal.  Reflexes - The patient's reflexes were normal in all extremities and he had no pathological reflexes.  Sensory - Light touch, temperature/pinprick, vibration and proprioception, and Romberg testing were assessed and were normal.    Coordination - The patient had normal movements in the hands and feet with no ataxia or dysmetria.  Tremor was absent.  Gait and Station - The patient's transfers, posture, gait, station, and turns were observed as normal.  Data reviewed: I personally reviewed the images and agree with the radiology interpretations.  MRI of the brain 07/02/13 - multifocal areas of acute and subacute infarction in the supratentorial compartment, affecting left and right. This could represent hypotensive event or circulatory arrhythmia. MRA of the brain 07/05/2013 - Negative intracranial MRA, allowing for mild motion artifact.   2D Echocardiogram - ejection fraction 60-65%. No cardiac source of emboli identified.  Carotid Doppler Preliminary report: 1-39% ICA stenosis. Vertebral artery flow is antegrade.  EKG sinus rhythm rate 76 beats per minute. For complete results please see formal report.  TCD emboli - no MES for 30  min cardionet - no afib for 30 days Loop recorder - proxysmal afib episodes found, put on AC.  Component     Latest Ref Rng 07/05/2013 10/01/2013  Total Protein     6.0 - 8.5 g/dL  6.9  Albumin     3.5 - 4.8 g/dL  4.4  Total Bilirubin     0.0 - 1.2 mg/dL  0.5  Bilirubin, Direct     0.00 - 0.40 mg/dL  0.11  Alkaline Phosphatase     39 - 117 IU/L  99  AST     0 - 40 IU/L  20  ALT     0 - 44 IU/L  23  Cholesterol     0 - 200 mg/dL 190   Triglycerides     <150 mg/dL 194 (H)   HDL     >39 mg/dL 36 (L)   Total CHOL/HDL Ratio      5.3   VLDL     0 - 40 mg/dL 39   LDL (calc)     0 - 99 mg/dL 115 (H)   Hemoglobin A1C     <5.7 % 5.8 (H)  Mean Plasma Glucose     <117 mg/dL 120 (H)   CK Total     24 - 204 U/L  112    Assessment: As you may recall, he is a 82 y.o.  Caucasian male with a diagnosis of embolic stroke. Pt recovered well from the stroke. MRI showed bilateral involvement, raising suspicion for cardioembolic stroke. So far cardionet was negative, TCD emboli detection negative also, but due to high suspicion, loop recorder was placed in 10/2013. In 07/13/2014, he was found to have Afib episode, and he was put on Xarelto by cardiology. Currently also on metoprolol for rate control. Pt has been doing well.   Plan:  - continue Xarelto and zocor for stroke prevention - continue follow up with cardiology for afib management - check BP at home. - Follow up with your primary care physician for stroke risk factor modification. Recommend maintain blood pressure goal <130/80, diabetes with hemoglobin A1c goal below 6.5% and lipids with LDL cholesterol goal below 70 mg/dL.  - follow up in 6 months.   Patient Instructions  - continue Xarelto and zocor for stroke prevention - continue follow up with cardiology for afib management - check BP at home. - Follow up with your primary care physician for stroke risk factor modification. Recommend maintain blood pressure goal <130/80,  diabetes with hemoglobin A1c goal below 6.5% and lipids with LDL cholesterol goal below 70 mg/dL.  - follow up as needed.    Rosalin Hawking, MD PhD [facility] [address] [place], [place] [address] [phone]

## 2015-07-19 NOTE — Patient Instructions (Signed)
-   continue Xarelto and zocor for stroke prevention - continue follow up with cardiology for afib management - check BP at home. - Follow up with your primary care physician for stroke risk factor modification. Recommend maintain blood pressure goal <130/80, diabetes with hemoglobin A1c goal below 6.5% and lipids with LDL cholesterol goal below 70 mg/dL.  - follow up as needed.

## 2015-07-19 NOTE — Telephone Encounter (Signed)
Spoke w/ pt and informed him that his home monitor will send the information automatically when he gets back home on Monday. Pt verbalized understanding.

## 2015-07-19 NOTE — Telephone Encounter (Signed)
Edward Maynard is calling to  verify how to send a transmission and can they send it earlier than Friday . Please call   Thanks

## 2015-07-23 ENCOUNTER — Ambulatory Visit (INDEPENDENT_AMBULATORY_CARE_PROVIDER_SITE_OTHER): Payer: Medicare Other | Admitting: *Deleted

## 2015-07-23 DIAGNOSIS — I6340 Cerebral infarction due to embolism of unspecified cerebral artery: Secondary | ICD-10-CM

## 2015-07-23 NOTE — Progress Notes (Signed)
Carelink Summary Report / Loop Recorder 

## 2015-07-26 ENCOUNTER — Emergency Department (HOSPITAL_COMMUNITY)
Admission: EM | Admit: 2015-07-26 | Discharge: 2015-07-27 | Disposition: A | Discharge status: Home / Self Care | Payer: Medicare Other | Attending: Emergency Medicine | Admitting: Emergency Medicine

## 2015-07-26 ENCOUNTER — Encounter (HOSPITAL_COMMUNITY): Payer: Self-pay | Admitting: Emergency Medicine

## 2015-07-26 DIAGNOSIS — Z79899 Other long term (current) drug therapy: Secondary | ICD-10-CM | POA: Diagnosis not present

## 2015-07-26 DIAGNOSIS — Z8673 Personal history of transient ischemic attack (TIA), and cerebral infarction without residual deficits: Secondary | ICD-10-CM | POA: Diagnosis not present

## 2015-07-26 DIAGNOSIS — W268XXA Contact with other sharp object(s), not elsewhere classified, initial encounter: Secondary | ICD-10-CM | POA: Diagnosis not present

## 2015-07-26 DIAGNOSIS — Y999 Unspecified external cause status: Secondary | ICD-10-CM | POA: Diagnosis not present

## 2015-07-26 DIAGNOSIS — Y9389 Activity, other specified: Secondary | ICD-10-CM | POA: Insufficient documentation

## 2015-07-26 DIAGNOSIS — Z7901 Long term (current) use of anticoagulants: Secondary | ICD-10-CM | POA: Insufficient documentation

## 2015-07-26 DIAGNOSIS — Y9289 Other specified places as the place of occurrence of the external cause: Secondary | ICD-10-CM | POA: Diagnosis not present

## 2015-07-26 DIAGNOSIS — Z87891 Personal history of nicotine dependence: Secondary | ICD-10-CM | POA: Insufficient documentation

## 2015-07-26 DIAGNOSIS — S81811A Laceration without foreign body, right lower leg, initial encounter: Secondary | ICD-10-CM | POA: Diagnosis present

## 2015-07-26 DIAGNOSIS — E7800 Pure hypercholesterolemia, unspecified: Secondary | ICD-10-CM | POA: Insufficient documentation

## 2015-07-26 DIAGNOSIS — E785 Hyperlipidemia, unspecified: Secondary | ICD-10-CM | POA: Diagnosis not present

## 2015-07-26 MED ORDER — LIDOCAINE-EPINEPHRINE 2 %-1:100000 IJ SOLN: 10.0000 mL | Freq: Once | INTRAMUSCULAR | Status: DC

## 2015-07-26 NOTE — ED Notes (Signed)
Pt states that the top of a toilet fell down the stairs and cut open his R leg in two spots. Bleeding not controlled at this time as he is on Xarelto. Leg wrapped with coban. Alert and oriented.

## 2015-07-27 MED ORDER — LIDOCAINE-EPINEPHRINE 1 %-1:100000 IJ SOLN
10.0000 mL | Freq: Once | INTRAMUSCULAR | Status: AC
  Administered 2015-07-27: 10 mL

## 2015-07-27 MED ORDER — LIDOCAINE-EPINEPHRINE 1 %-1:100000 IJ SOLN
INTRAMUSCULAR | Status: AC
Start: 2015-07-27 — End: 2015-07-27
  Administered 2015-07-27: 10 mL
  Filled 2015-07-27: qty 1

## 2015-07-27 NOTE — Discharge Instructions (Signed)
Laceration Care, Adult  A laceration is a cut that goes through all layers of the skin. The cut also goes into the tissue that is right under the skin. Some cuts heal on their own. Others need to be closed with stitches (sutures), staples, skin adhesive strips, or wound glue. Taking care of your cut lowers your risk of infection and helps your cut to heal better.  HOW TO TAKE CARE OF YOUR CUT  For stitches or staples:  · Keep the wound clean and dry.  · If you were given a bandage (dressing), you should change it at least one time per day or as told by your doctor. You should also change it if it gets wet or dirty.  · Keep the wound completely dry for the first 24 hours or as told by your doctor. After that time, you may take a shower or a bath. However, make sure that the wound is not soaked in water until after the stitches or staples have been removed.  · Clean the wound one time each day or as told by your doctor:    Wash the wound with soap and water.    Rinse the wound with water until all of the soap comes off.    Pat the wound dry with a clean towel. Do not rub the wound.  · After you clean the wound, put a thin layer of antibiotic ointment on it as told by your doctor. This ointment:    Helps to prevent infection.    Keeps the bandage from sticking to the wound.  · Have your stitches or staples removed as told by your doctor.  If your doctor used skin adhesive strips:   · Keep the wound clean and dry.  · If you were given a bandage, you should change it at least one time per day or as told by your doctor. You should also change it if it gets dirty or wet.  · Do not get the skin adhesive strips wet. You can take a shower or a bath, but be careful to keep the wound dry.  · If the wound gets wet, pat it dry with a clean towel. Do not rub the wound.  · Skin adhesive strips fall off on their own. You can trim the strips as the wound heals. Do not remove any strips that are still stuck to the wound. They will  fall off after a while.  If your doctor used wound glue:  · Try to keep your wound dry, but you may briefly wet it in the shower or bath. Do not soak the wound in water, such as by swimming.  · After you take a shower or a bath, gently pat the wound dry with a clean towel. Do not rub the wound.  · Do not do any activities that will make you really sweaty until the skin glue has fallen off on its own.  · Do not apply liquid, cream, or ointment medicine to your wound while the skin glue is still on.  · If you were given a bandage, you should change it at least one time per day or as told by your doctor. You should also change it if it gets dirty or wet.  · If a bandage is placed over the wound, do not let the tape for the bandage touch the skin glue.  · Do not pick at the glue. The skin glue usually stays on for 5-10 days. Then, it   falls off of the skin.  General Instructions   · To help prevent scarring, make sure to cover your wound with sunscreen whenever you are outside after stitches are removed, after adhesive strips are removed, or when wound glue stays in place and the wound is healed. Make sure to wear a sunscreen of at least 30 SPF.  · Take over-the-counter and prescription medicines only as told by your doctor.  · If you were given antibiotic medicine or ointment, take or apply it as told by your doctor. Do not stop using the antibiotic even if your wound is getting better.  · Do not scratch or pick at the wound.  · Keep all follow-up visits as told by your doctor. This is important.  · Check your wound every day for signs of infection. Watch for:    Redness, swelling, or pain.    Fluid, blood, or pus.  · Raise (elevate) the injured area above the level of your heart while you are sitting or lying down, if possible.  GET HELP IF:  · You got a tetanus shot and you have any of these problems at the injection site:    Swelling.    Very bad pain.    Redness.    Bleeding.  · You have a fever.  · A wound that was  closed breaks open.  · You notice a bad smell coming from your wound or your bandage.  · You notice something coming out of the wound, such as wood or glass.  · Medicine does not help your pain.  · You have more redness, swelling, or pain at the site of your wound.  · You have fluid, blood, or pus coming from your wound.  · You notice a change in the color of your skin near your wound.  · You need to change the bandage often because fluid, blood, or pus is coming from the wound.  · You start to have a new rash.  · You start to have numbness around the wound.  GET HELP RIGHT AWAY IF:  · You have very bad swelling around the wound.  · Your pain suddenly gets worse and is very bad.  · You notice painful lumps near the wound or on skin that is anywhere on your body.  · You have a red streak going away from your wound.  · The wound is on your hand or foot and you cannot move a finger or toe like you usually can.  · The wound is on your hand or foot and you notice that your fingers or toes look pale or bluish.     This information is not intended to replace advice given to you by your health care provider. Make sure you discuss any questions you have with your health care provider.     Document Released: 08/23/2007 Document Revised: 07/21/2014 Document Reviewed: 03/02/2014  [employer] Interactive Patient Education ©2016 [employer]

## 2015-07-30 NOTE — ED Provider Notes (Signed)
CSN: [id number]     Arrival date & time 07/26/15  2139 History   First MD Initiated Contact with Patient 07/26/15 2304     Chief Complaint  Patient presents with  . Extremity Laceration     (Consider location/radiation/quality/duration/timing/severity/associated sxs/prior Treatment) HPI   81yM with RLE lacerations. Happened shortly before arrival. He was helping daughter move a toilet when the tank broke and he was cut. Bleeding requiring compression bandage. He is on xarelto. Mild pain at sites of lacerations. Can ambulate. Tetanus in last 1-2 years. No acute numbness, tingling or loss of strength.   Past Medical History  Diagnosis Date  . Stroke [facility]) 05/2013    MRI on 07/02/13 = Multifocal acute & subacute infarction involving right frontal MCA/ACA & left parietal MCA/PCA watershed areas  . High cholesterol     05/2013; no defecits  . Left arm weakness   . Left hand weakness   . Hyperlipemia   . Personal history of colonic polyp - adenoma  10/23/2013   Past Surgical History  Procedure Laterality Date  . Shoulder surgery Bilateral 2003, 1995  . Loop recorder implant N/A 10/31/2013    Procedure: LOOP RECORDER IMPLANT;  Surgeon: Coralyn Mark, MD;  Location: [place] [facility] CATH LAB;  Service: Cardiovascular;  Laterality: N/A;   Family History  Problem Relation Age of Onset  . Hypertension Mother   . Hypertension Father   . Stroke Father   . COPD Brother   . Colon cancer Neg Hx   . Heart attack Neg Hx   . Stroke Brother    Social History  Substance Use Topics  . Smoking status: Former Smoker    Quit date: 03/20/1970  . Smokeless tobacco: Never Used  . Alcohol Use: No    Review of Systems  All systems reviewed and negative, other than as noted in HPI.   Allergies  Review of patient's allergies indicates no known allergies.  Home Medications   Prior to Admission medications   Medication Sig Start Date End Date Taking? Authorizing Provider  metoprolol succinate (TOPROL-XL) 25  MG 24 hr tablet Take 1 tablet (25 mg total) by mouth daily. 12/29/14  Yes Thompson Grayer, MD  rivaroxaban (XARELTO) 20 MG TABS tablet Take 1 tablet by mouth  daily with supper 05/14/15  Yes Thompson Grayer, MD  simvastatin (ZOCOR) 40 MG tablet Take 1 tablet by mouth  daily at 6 pm 06/16/15  Yes Rosalin Hawking, MD   BP 153/83 mmHg  Pulse 61  Temp(Src) 98.3 F (36.8 C) (Oral)  Resp 18  SpO2 96% Physical Exam  Constitutional: He appears well-developed and well-nourished. No distress.  HENT:  Head: Normocephalic and atraumatic.  Eyes: Conjunctivae are normal. Right eye exhibits no discharge. Left eye exhibits no discharge.  Neck: Neck supple.  Cardiovascular: Normal rate, regular rhythm and normal heart sounds.  Exam reveals no gallop and no friction rub.   No murmur heard. Pulmonary/Chest: Effort normal and breath sounds normal. No respiratory distress.  Abdominal: Soft. He exhibits no distension. There is no tenderness.  Musculoskeletal: He exhibits no edema or tenderness.       Legs: 2 lacerations to RLE in pictured areas. Small abrasion proximal to laceration on shin. Laceration to lateral R calf is ~5 cm. Brisk oozing. No pulsatile bleeding. This was controlled after lido with epi was injected. Laceration to shin is ~2.5cm. Crescent shaped. Wounds were copiously irrigated with saline after anesthetic. No gross contamination, FB or evidence of significant neurovascular injury.  Neurological: He is alert.  Skin: Skin is warm and dry.  Psychiatric: He has a normal mood and affect. His behavior is normal. Thought content normal.  Nursing note and vitals reviewed.   ED Course  Procedures (including critical care time)  LACERATION REPAIR Performed by: Virgel Manifold Authorized by: Virgel Manifold Consent: Verbal consent obtained. Risks and benefits: risks, benefits and alternatives were discussed Consent given by: patient Patient identity confirmed: provided demographic data Prepped and Draped  in normal sterile fashion Wound explored  Laceration Location: R lateral calf  Laceration Length: 5 cm  No Foreign Bodies seen or palpated  Anesthesia: local infiltration  Local anesthetic: lidocaine 1% with  Anesthetic total: 3 ml  Irrigation method: syringe Amount of cleaning: standard  Skin closure: 4-0 prolene  Number of sutures: 1  Technique: running  Patient tolerance: Patient tolerated the procedure well with no immediate complications.   LACERATION REPAIR Performed by: Virgel Manifold Authorized by: Virgel Manifold Consent: Verbal consent obtained. Risks and benefits: risks, benefits and alternatives were discussed Consent given by: patient Patient identity confirmed: provided demographic data Prepped and Draped in normal sterile fashion Wound explored  Laceration Location: R anterior shin  Laceration Length: 2.5 cm  No Foreign Bodies seen or palpated  Anesthesia: local infiltration  Local anesthetic: lidocaine 1% with epi  Anesthetic total: 1.5 ml  Irrigation method: syringe Amount of cleaning: standard  Skin closure: 4-0 prolene  Number of sutures: 3  Technique: horizontal mattress.  Patient tolerance: Patient tolerated the procedure well with no immediate complications.   Labs Review Labs Reviewed - No data to display  Imaging Review No results found. I have personally reviewed and evaluated these images and lab results as part of my medical decision-making.   EKG Interpretation None      MDM   Final diagnoses:  Lacerations of multiple sites of right leg, initial encounter    81yM with lacerations to RLE. Little concern for osseous injury or retained FB. Repaired. He reports tetanus is current. Anticoagulated. Wounds now hemostatic. I do not feel he needs to hold xarelto and advised to take as prescribed. Continued wound care and return precautions discussed. Outpt FU as needed and for suture removal in 7-10 days.     Virgel Manifold, MD 07/30/15 1151

## 2015-08-14 LAB — CUP PACEART REMOTE DEVICE CHECK: MDC IDC SESS DTM: 20170405170721

## 2015-08-14 NOTE — Progress Notes (Signed)
Carelink summary report received. Battery status OK. Normal device function. No new symptom episodes, brady, or pause episodes. 2 AF and 2 tachy episodes, burden <0.1%, +Xarelto.  Monthly summary reports and ROV/PRN

## 2015-08-23 ENCOUNTER — Ambulatory Visit (INDEPENDENT_AMBULATORY_CARE_PROVIDER_SITE_OTHER): Payer: Medicare Other | Admitting: *Deleted

## 2015-08-23 DIAGNOSIS — I6340 Cerebral infarction due to embolism of unspecified cerebral artery: Secondary | ICD-10-CM | POA: Diagnosis not present

## 2015-08-23 NOTE — Progress Notes (Signed)
Carelink Summary Report / Loop Recorder 

## 2015-09-01 LAB — CUP PACEART REMOTE DEVICE CHECK: Date Time Interrogation Session: 20170505173653

## 2015-09-22 ENCOUNTER — Encounter: Payer: Self-pay | Admitting: Cardiology

## 2015-09-22 ENCOUNTER — Ambulatory Visit (INDEPENDENT_AMBULATORY_CARE_PROVIDER_SITE_OTHER): Payer: Medicare Other | Admitting: *Deleted

## 2015-09-22 DIAGNOSIS — I6340 Cerebral infarction due to embolism of unspecified cerebral artery: Secondary | ICD-10-CM | POA: Diagnosis not present

## 2015-09-23 ENCOUNTER — Telehealth: Payer: Self-pay | Admitting: *Deleted

## 2015-09-23 NOTE — Progress Notes (Signed)
Carelink Summary Report / Loop Recorder 

## 2015-09-23 NOTE — Telephone Encounter (Signed)
Per call from patients wife, the xarelto requires a new prior authorization. She stated that [facility] claims to have faxed the form to the office twice.

## 2015-09-23 NOTE — Telephone Encounter (Signed)
Wife stated that the patient is not out of medication, he has eight days supply on hand.

## 2015-09-24 ENCOUNTER — Telehealth: Payer: Self-pay

## 2015-09-24 NOTE — Telephone Encounter (Signed)
PA submitted to [facility] via Cover My Meds

## 2015-09-24 NOTE — Telephone Encounter (Signed)
Prior auth for Xarelto 20mg submitted to [facility]. 

## 2015-09-29 ENCOUNTER — Encounter: Payer: Self-pay | Admitting: Nurse Practitioner

## 2015-09-30 ENCOUNTER — Telehealth: Payer: Self-pay | Admitting: *Deleted

## 2015-09-30 LAB — CUP PACEART REMOTE DEVICE CHECK: MDC IDC SESS DTM: 20170604180744

## 2015-09-30 NOTE — Telephone Encounter (Signed)
Patients wife left a msg on the refill line to follow up on the prior authorization for Xarelto. He takes his last dose today. I will provide samples and route message to [facility] to follow up on outcome of prior auth. She is aware that Vaughan Basta is not in the office today. Wife very appreciative and thanked me for returning her call.

## 2015-10-01 ENCOUNTER — Telehealth: Payer: Self-pay | Admitting: Internal Medicine

## 2015-10-01 NOTE — Telephone Encounter (Signed)
I called [facility] today to find what the holdup on this approval was. They informed me that the first PA was initiated by the patient and/or wife on 09/20/15 It was denied for lack of info. I did not know that when I submitted PA on 07/07. Medicare will not allow another PA once it is denied. It needs to be appealed. Left message for patient to call me back.

## 2015-10-06 ENCOUNTER — Telehealth: Payer: Self-pay | Admitting: Nurse Practitioner

## 2015-10-06 LAB — CUP PACEART REMOTE DEVICE CHECK: Date Time Interrogation Session: 20170704183517

## 2015-10-06 NOTE — Telephone Encounter (Signed)
New message     Prior authorization  for Xarelto, please fax the information back to [place] [facility]) fax number [phone]

## 2015-10-11 NOTE — Telephone Encounter (Signed)
This was already appealed on 10/01/2015.

## 2015-10-18 ENCOUNTER — Telehealth: Payer: Self-pay

## 2015-10-18 NOTE — Telephone Encounter (Signed)
Appeal for Xarelto is approved by [facility]. Approval # [id number] and is good through 03/05/2016.

## 2015-10-19 ENCOUNTER — Encounter: Payer: Self-pay | Admitting: Internal Medicine

## 2015-10-21 ENCOUNTER — Ambulatory Visit (INDEPENDENT_AMBULATORY_CARE_PROVIDER_SITE_OTHER): Payer: Medicare Other | Admitting: *Deleted

## 2015-10-21 DIAGNOSIS — I6340 Cerebral infarction due to embolism of unspecified cerebral artery: Secondary | ICD-10-CM

## 2015-10-22 NOTE — Progress Notes (Signed)
Carelink Summary Report / Loop Recorder 

## 2015-11-14 IMAGING — CR DG CERVICAL SPINE COMPLETE 4+V
6 series · 6 of 6 positions shown · non-contrast
Comparison: MR cervical spine of 05/18/2005

CLINICAL DATA: Left neck and left arm pain with weakness of the
left arm, no injury

EXAM:
CERVICAL SPINE  4+ VIEWS

[view not recorded (1 of 6)]
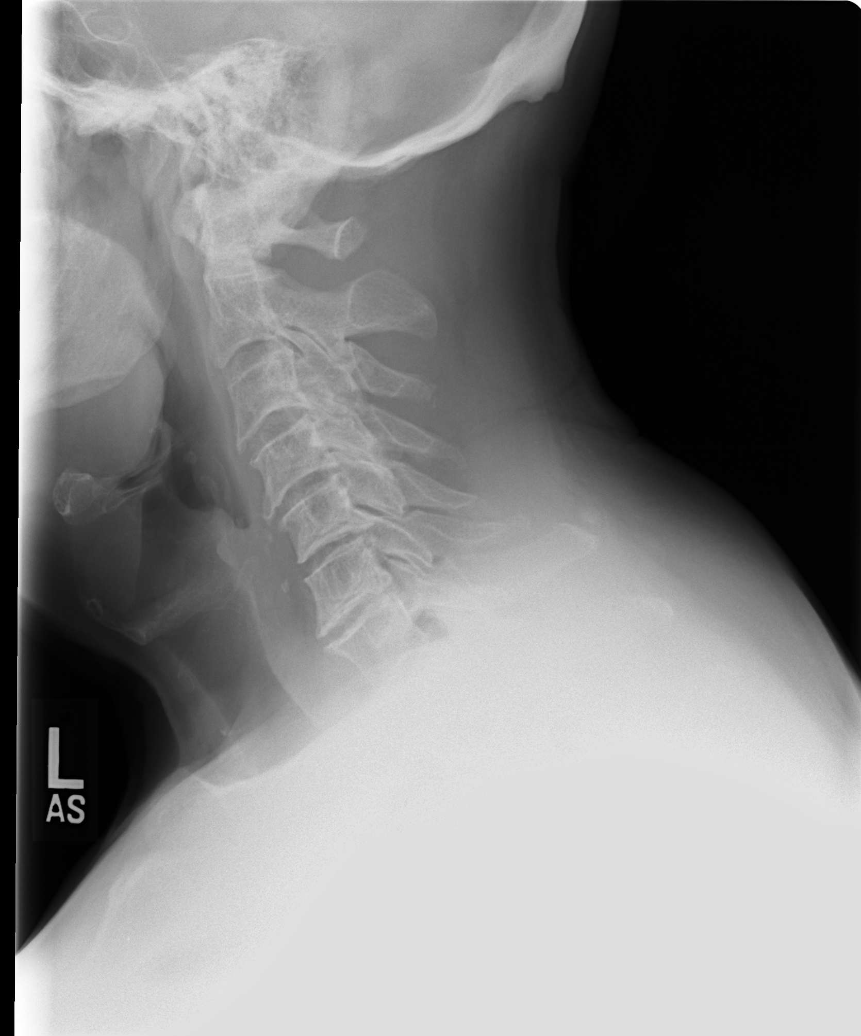

[view not recorded (2 of 6)]
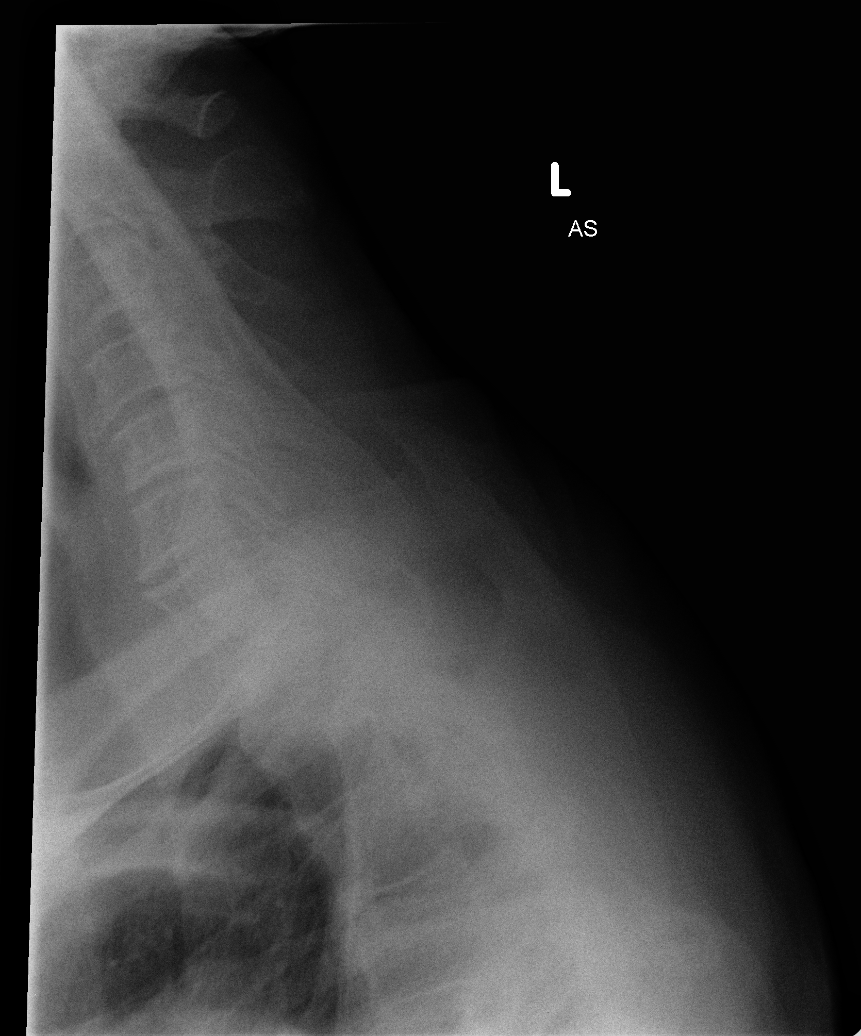

[view not recorded (3 of 6)]
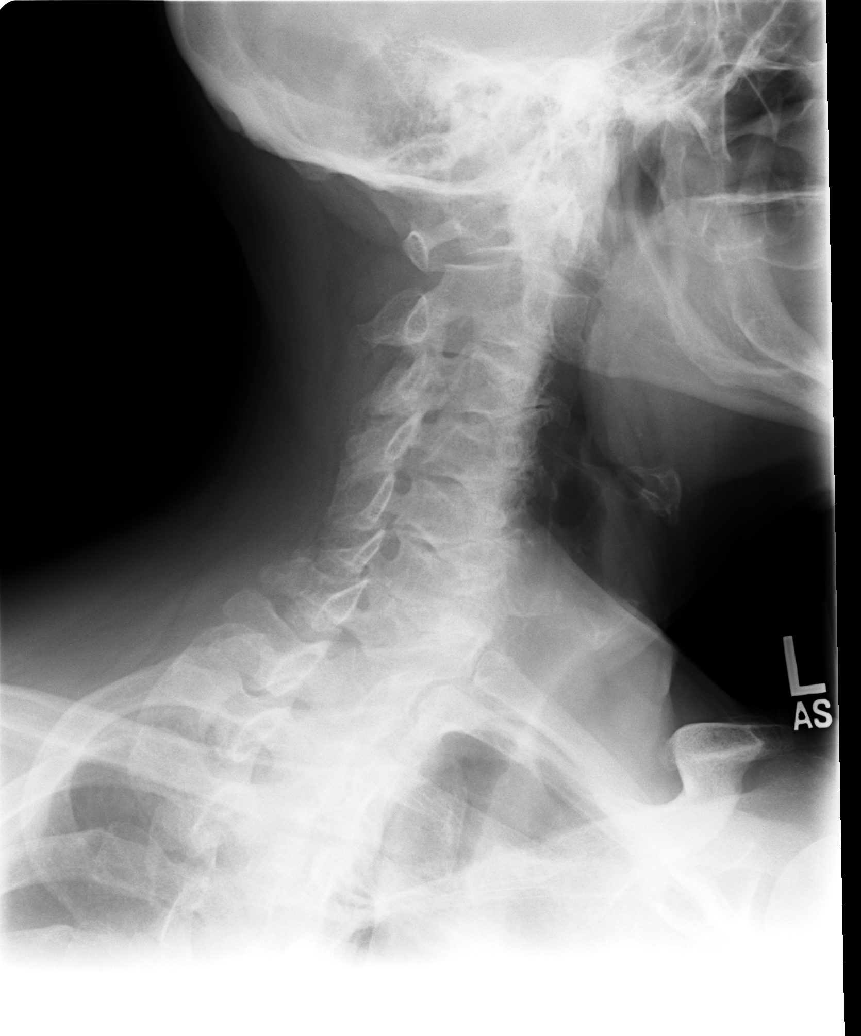

[view not recorded (4 of 6)]
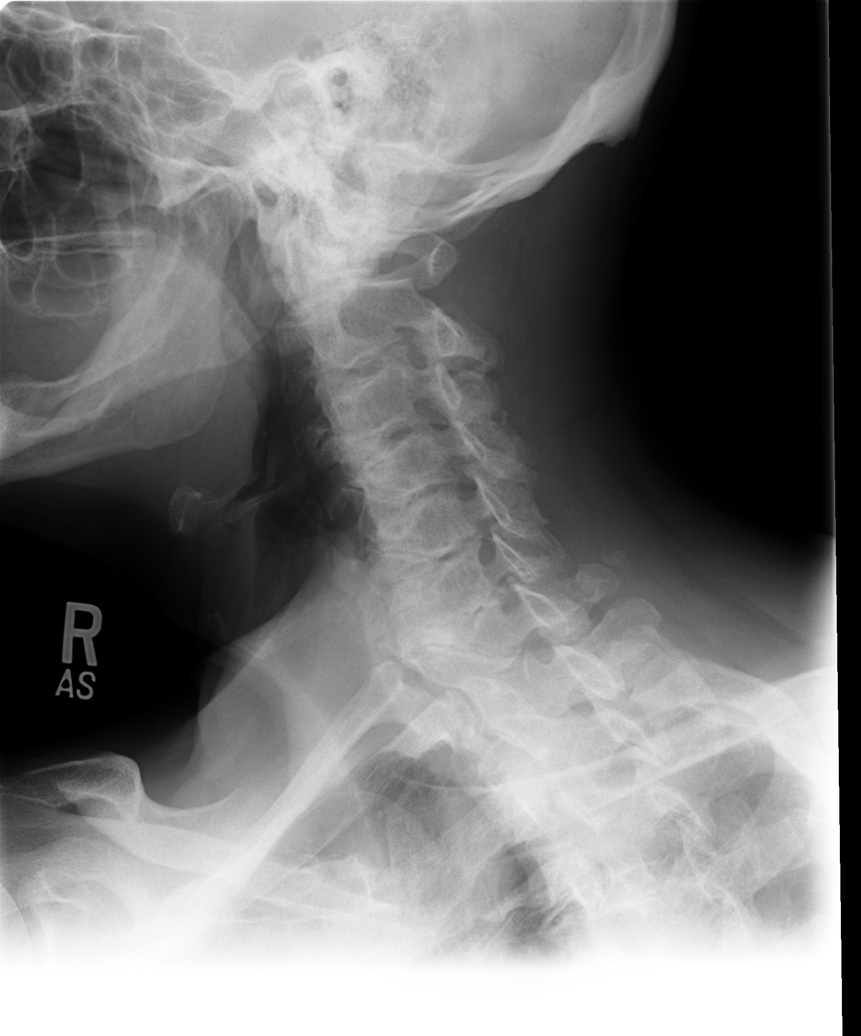

[view not recorded (5 of 6)]
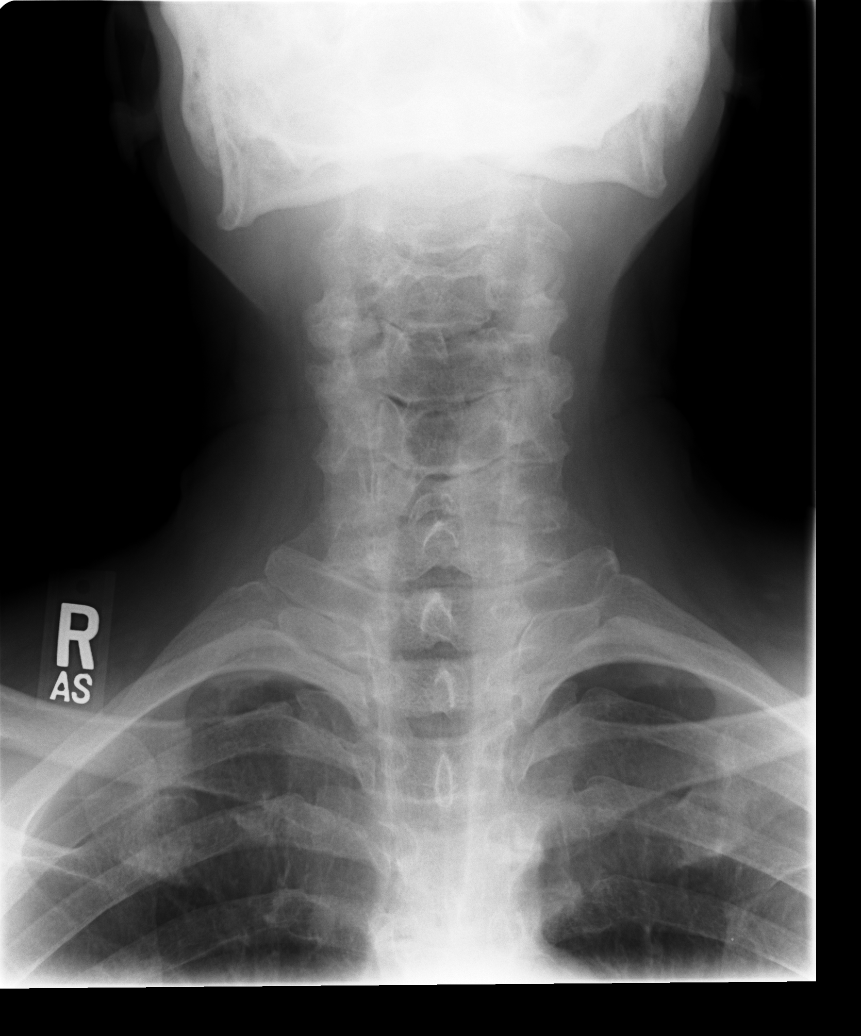

[view not recorded (6 of 6)]
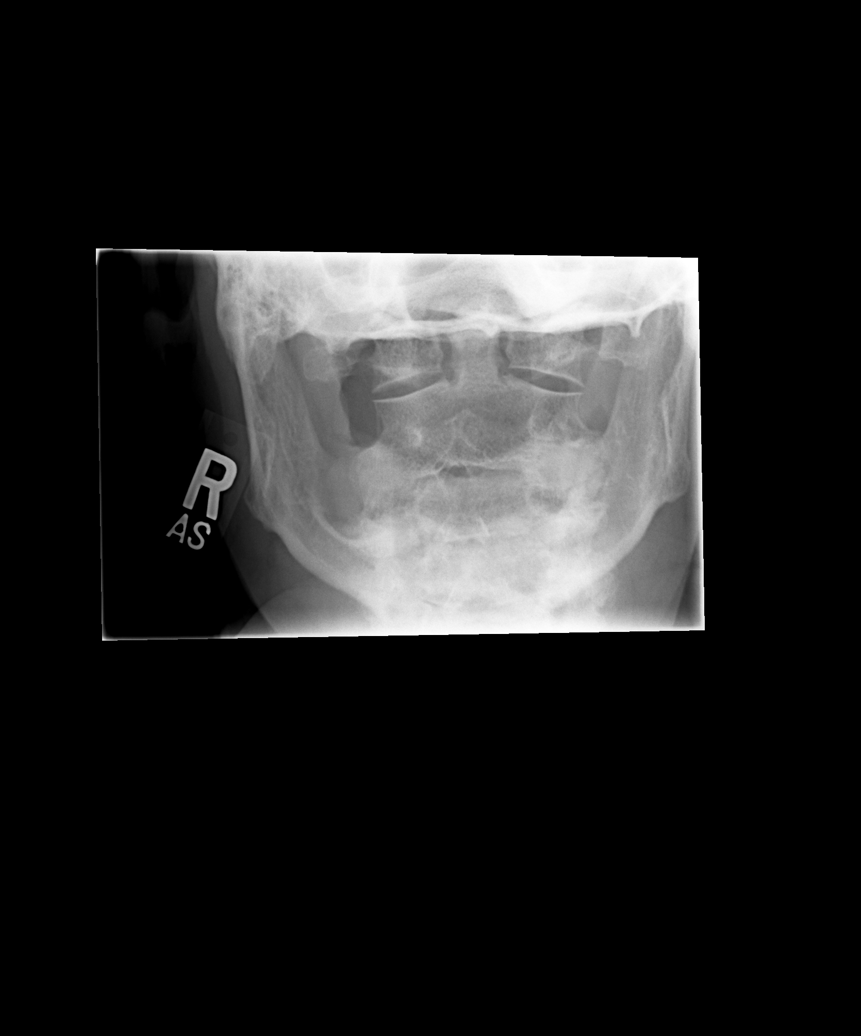

[6 of 6 positions shown; findings below may reference images not displayed]

FINDINGS: There is straightened alignment of the cervical vertebrae. The does
appear to have been progression of degenerative disc disease from
C4-C7 with loss of disc space, sclerosis, and spurring. No
prevertebral soft tissue swelling is seen. On oblique views, there
does appear to be a moderate foraminal narrowing bilaterally
primarily at C5-6 and C6-7. The odontoid process is intact. The lung
apices are clear.
IMPRESSION: Straightened alignment with progression of degenerative disc disease
from C4-C7 with moderate foraminal narrowing at C5-6 and C6-7
bilaterally.

## 2015-11-16 ENCOUNTER — Encounter: Payer: Self-pay | Admitting: Nurse Practitioner

## 2015-11-16 LAB — CUP PACEART REMOTE DEVICE CHECK: MDC IDC SESS DTM: 20170803193536

## 2015-11-16 NOTE — Progress Notes (Signed)
Carelink summary report received. Battery status OK. Normal device function. No new symptom episodes, brady, or pause episodes. No new AF episodes. 4 tachy- 3 w/ ECGs appear atrial in origin, PVC noted. +Xarelto +metoprolol. Monthly summary reports and ROV/PRN

## 2015-11-23 ENCOUNTER — Ambulatory Visit (INDEPENDENT_AMBULATORY_CARE_PROVIDER_SITE_OTHER): Payer: Medicare Other | Admitting: *Deleted

## 2015-11-23 ENCOUNTER — Encounter: Payer: Medicare Other | Admitting: *Deleted

## 2015-11-23 DIAGNOSIS — I6340 Cerebral infarction due to embolism of unspecified cerebral artery: Secondary | ICD-10-CM | POA: Diagnosis not present

## 2015-11-23 NOTE — Progress Notes (Signed)
Carelink Summary Report / Loop Recorder 

## 2015-11-29 ENCOUNTER — Encounter: Payer: Medicare Other | Admitting: Nurse Practitioner

## 2015-12-01 NOTE — Progress Notes (Signed)
Electrophysiology Office Note Date: 12/02/2015  ID:  HAIDER CARRERA, DOB [birth year], MRN [id number]  PCP: Simona Huh, MD Electrophysiologist: Rayann Heman  CC: atrial fibrillation/PVC follow up  Edward Maynard is a 82 y.o. male seen today for Dr Rayann Heman.  He underwent ILR implantation for cryptogenic stroke and atrial fibrillation was identified.  He presents today for routine electrophysiology followup.  Since last being seen in our clinic, the patient reports doing very well.  He denies chest pain, palpitations, dyspnea, PND, orthopnea, nausea, vomiting, dizziness, syncope, edema, weight gain, or early satiety.  He remains active playing golf several [place] a year [place] the beach.   Echo 11/2014 demonstrated EF 0000000, grade 1 diastolic dysfunction, LA 47  Device History: MDT ILR implanted 10/2013 for cryptogenic stroke  Past Medical History:  Diagnosis Date  . High cholesterol    05/2013; no defecits  . Hyperlipemia   . Left arm weakness   . Left hand weakness   . Personal history of colonic polyp - adenoma  10/23/2013  . Stroke [facility]) 05/2013   MRI on 07/02/13 = Multifocal acute & subacute infarction involving right frontal MCA/ACA & left parietal MCA/PCA watershed areas   Past Surgical History:  Procedure Laterality Date  . LOOP RECORDER IMPLANT N/A 10/31/2013   Procedure: LOOP RECORDER IMPLANT;  Surgeon: Coralyn Mark, MD;  Location: [facility] CATH LAB;  Service: Cardiovascular;  Laterality: N/A;  . SHOULDER SURGERY Bilateral 2003, 1995    Current Outpatient Prescriptions  Medication Sig Dispense Refill  . metoprolol succinate (TOPROL-XL) 25 MG 24 hr tablet Take 1 tablet (25 mg total) by mouth daily. 90 tablet 3  . rivaroxaban (XARELTO) 20 MG TABS tablet Take 1 tablet by mouth  daily with supper 90 tablet 3  . simvastatin (ZOCOR) 40 MG tablet Take 1 tablet by mouth  daily at 6 pm 90 tablet 1   No current facility-administered medications for this visit.     Allergies:   Review of  patient's allergies indicates no known allergies.   Social History: Social History   Social History  . Marital status: Married    Spouse name: lea  . Number of children: 2  . Years of education: college   Occupational History  . retired    Social History Main Topics  . Smoking status: Former Smoker    Quit date: 03/20/1970  . Smokeless tobacco: Never Used  . Alcohol use No  . Drug use: No  . Sexual activity: Yes   Other Topics Concern  . Not on file   Social History Narrative   Patient right handed   Patient lives with his wife   Patient coffee daily   Enjoys playing golf    Family History: Family History  Problem Relation Age of Onset  . Hypertension Mother   . Hypertension Father   . Stroke Father   . COPD Brother   . Stroke Brother   . Colon cancer Neg Hx   . Heart attack Neg Hx     Review of Systems: All other systems reviewed and are otherwise negative except as noted above.   Physical Exam: VS:  BP 114/66   Pulse 69   Ht 6' (1.829 m)   Wt 216 lb (98 kg)   BMI 29.29 kg/m  , BMI Body mass index is 29.29 kg/m. Wt Readings from Last 3 Encounters:  12/02/15 216 lb (98 kg)  07/19/15 218 lb 3.2 oz (99 kg)  02/24/15 213 lb (96.6  kg)    GEN- The patient is well appearing, alert and oriented x 3 today.   HEENT: normocephalic, atraumatic; sclera clear, conjunctiva pink; hearing intact; oropharynx clear; neck supple  Lungs- Clear to ausculation bilaterally, normal work of breathing.  No wheezes, rales, rhonchi Heart- Regular rate and rhythm GI- soft, non-tender, non-distended, bowel sounds present  Extremities- no clubbing, cyanosis, or edema; DP/PT/radial pulses 2+ bilaterally MS- no significant deformity or atrophy Skin- warm and dry, no rash or lesion  Psych- euthymic mood, full affect Neuro- strength and sensation are intact   EKG:  EKG is not ordered today.  Recent Labs: No results found for requested labs within last 8760 hours.    Other  studies Reviewed: Additional studies/ records that were reviewed today include: Dr Jackalyn Lombard office notes, echo  Assessment and Plan: 1.  Paroxysmal atrial fibrillation Asymptomatic Burden <0.1% by device interrogation today. Continue Xarelto for CHADS2VASC of at least 4 BMET, CBC followed by PCP  2. PVC's, NSVT Asymptomatic Normal EF by echo 11/2014 Low burden by ILR interrogation today    Current medicines are reviewed at length with the patient today.   The patient does not have concerns regarding his medicines.  The following changes were made today:  none  Labs/ tests ordered today include: none   Disposition:   Follow up with me in 1 year   Signed, Chanetta Marshall, NP 12/02/2015 9:13 AM   [place] [address] [phone] (office) [phone] (fax)

## 2015-12-02 ENCOUNTER — Ambulatory Visit (INDEPENDENT_AMBULATORY_CARE_PROVIDER_SITE_OTHER): Payer: Medicare Other | Admitting: Nurse Practitioner

## 2015-12-02 VITALS — BP 114/66 | HR 69 | Ht 72.0 in | Wt 216.0 lb

## 2015-12-02 DIAGNOSIS — I639 Cerebral infarction, unspecified: Secondary | ICD-10-CM | POA: Diagnosis not present

## 2015-12-02 DIAGNOSIS — I493 Ventricular premature depolarization: Secondary | ICD-10-CM

## 2015-12-02 NOTE — Patient Instructions (Signed)
Medication Instructions:   Your physician recommends that you continue on your current medications as directed. Please refer to the Current Medication list given to you today.   If you need a refill on your cardiac medications before your next appointment, please call your pharmacy.  Labwork: NONE ORDER TODAY   Testing/Procedures: NONE ORDER TODAY    Follow-Up:Your physician wants you to follow-up in: ONE YEAR WITH  AMBER SEILER    You will receive a reminder letter in the mail two months in advance. If you don't receive a letter, please call our office to schedule the follow-up appointment.      Any Other Special Instructions Will Be Listed Below (If Applicable).                                                                                                                                                   

## 2015-12-03 ENCOUNTER — Other Ambulatory Visit: Payer: Self-pay | Admitting: Neurology

## 2015-12-07 NOTE — Telephone Encounter (Signed)
error 

## 2015-12-08 ENCOUNTER — Other Ambulatory Visit: Payer: Self-pay | Admitting: Internal Medicine

## 2015-12-08 DIAGNOSIS — I493 Ventricular premature depolarization: Secondary | ICD-10-CM

## 2015-12-20 ENCOUNTER — Ambulatory Visit (INDEPENDENT_AMBULATORY_CARE_PROVIDER_SITE_OTHER): Payer: Medicare Other | Admitting: *Deleted

## 2015-12-20 DIAGNOSIS — I639 Cerebral infarction, unspecified: Secondary | ICD-10-CM | POA: Diagnosis not present

## 2015-12-21 LAB — CUP PACEART REMOTE DEVICE CHECK: Date Time Interrogation Session: 20170902194027

## 2015-12-21 NOTE — Progress Notes (Signed)
Carelink summary report received. Battery status OK. Normal device function. No new symptom episodes, brady, or pause episodes. No new AF episodes. 2 tachy- previously noted on in office check 11/16/15. Monthly summary reports and ROV/PRN

## 2015-12-22 NOTE — Progress Notes (Signed)
Carelink Summary Report / Loop Recorder 

## 2016-01-19 ENCOUNTER — Ambulatory Visit (INDEPENDENT_AMBULATORY_CARE_PROVIDER_SITE_OTHER): Payer: Medicare Other | Admitting: *Deleted

## 2016-01-19 DIAGNOSIS — I639 Cerebral infarction, unspecified: Secondary | ICD-10-CM | POA: Diagnosis not present

## 2016-01-20 NOTE — Progress Notes (Signed)
Carelink Summary Report / Loop Recorder 

## 2016-01-29 LAB — CUP PACEART REMOTE DEVICE CHECK
Date Time Interrogation Session: 20171002203954
Implantable Pulse Generator Implant Date: 20150814

## 2016-01-29 NOTE — Progress Notes (Signed)
Carelink summary report received. Battery status OK. Normal device function. No new symptom episodes, tachy episodes, brady, or pause episodes. No new AF episodes. Monthly summary reports and ROV/PRN 

## 2016-02-18 ENCOUNTER — Ambulatory Visit (INDEPENDENT_AMBULATORY_CARE_PROVIDER_SITE_OTHER): Payer: Medicare Other | Admitting: *Deleted

## 2016-02-18 DIAGNOSIS — I639 Cerebral infarction, unspecified: Secondary | ICD-10-CM | POA: Diagnosis not present

## 2016-02-21 NOTE — Progress Notes (Signed)
Carelink Summary Report / Loop Recorder 

## 2016-02-26 LAB — CUP PACEART REMOTE DEVICE CHECK
Date Time Interrogation Session: 20171101210736
MDC IDC PG IMPLANT DT: 20150814

## 2016-02-26 NOTE — Progress Notes (Signed)
Carelink summary report received. Battery status OK. Normal device function. No new symptom episodes, tachy episodes, brady, or pause episodes. No new AF episodes. Monthly summary reports and ROV/PRN 

## 2016-03-21 ENCOUNTER — Ambulatory Visit (INDEPENDENT_AMBULATORY_CARE_PROVIDER_SITE_OTHER): Payer: Medicare Other | Admitting: *Deleted

## 2016-03-21 DIAGNOSIS — I639 Cerebral infarction, unspecified: Secondary | ICD-10-CM | POA: Diagnosis not present

## 2016-03-22 NOTE — Progress Notes (Signed)
Carelink Summary Report 

## 2016-03-29 LAB — CUP PACEART REMOTE DEVICE CHECK
Date Time Interrogation Session: 20171201213749
MDC IDC PG IMPLANT DT: 20150814

## 2016-04-18 ENCOUNTER — Ambulatory Visit (INDEPENDENT_AMBULATORY_CARE_PROVIDER_SITE_OTHER): Payer: Medicare Other | Admitting: *Deleted

## 2016-04-18 DIAGNOSIS — I639 Cerebral infarction, unspecified: Secondary | ICD-10-CM | POA: Diagnosis not present

## 2016-04-19 NOTE — Progress Notes (Signed)
Carelink Summary Report / Loop Recorder 

## 2016-05-01 LAB — CUP PACEART REMOTE DEVICE CHECK
Date Time Interrogation Session: 20171231223722
MDC IDC PG IMPLANT DT: 20150814

## 2016-05-17 LAB — CUP PACEART REMOTE DEVICE CHECK
Implantable Pulse Generator Implant Date: 20150814
MDC IDC SESS DTM: 20180131000534

## 2016-05-18 ENCOUNTER — Ambulatory Visit (INDEPENDENT_AMBULATORY_CARE_PROVIDER_SITE_OTHER): Payer: Medicare Other | Admitting: *Deleted

## 2016-05-18 DIAGNOSIS — I639 Cerebral infarction, unspecified: Secondary | ICD-10-CM

## 2016-05-22 NOTE — Progress Notes (Signed)
Carelink Summary Report / Loop Recorder 

## 2016-05-25 ENCOUNTER — Other Ambulatory Visit: Payer: Self-pay | Admitting: Internal Medicine

## 2016-05-25 NOTE — Telephone Encounter (Signed)
Pt last seen on 11/2015 by Chanetta Marshall, NP. SCr from 09/2015 was 1.31, Weight from 11/2015 was 98Kg, Creatine clearance 62.34 ml/min, pt will remain Xarelto 20mg  QD.

## 2016-06-02 LAB — CUP PACEART REMOTE DEVICE CHECK
Date Time Interrogation Session: 20180301233852
Implantable Pulse Generator Implant Date: 20150814

## 2016-06-19 ENCOUNTER — Ambulatory Visit (INDEPENDENT_AMBULATORY_CARE_PROVIDER_SITE_OTHER): Payer: Medicare Other | Admitting: *Deleted

## 2016-06-19 DIAGNOSIS — I639 Cerebral infarction, unspecified: Secondary | ICD-10-CM

## 2016-06-19 NOTE — Progress Notes (Signed)
Carelink Summary Report / Loop Recorder 

## 2016-06-22 LAB — CUP PACEART REMOTE DEVICE CHECK
Implantable Pulse Generator Implant Date: 20150814
MDC IDC SESS DTM: 20180401000934

## 2016-07-17 ENCOUNTER — Ambulatory Visit (INDEPENDENT_AMBULATORY_CARE_PROVIDER_SITE_OTHER): Payer: Medicare Other | Admitting: *Deleted

## 2016-07-17 DIAGNOSIS — I639 Cerebral infarction, unspecified: Secondary | ICD-10-CM | POA: Diagnosis not present

## 2016-07-18 NOTE — Progress Notes (Signed)
Carelink Summary Report / Loop Recorder 

## 2016-07-30 LAB — CUP PACEART REMOTE DEVICE CHECK
Date Time Interrogation Session: 20180501004042
MDC IDC PG IMPLANT DT: 20150814

## 2016-07-30 NOTE — Progress Notes (Signed)
Carelink summary report received. Battery status OK. Normal device function. No new symptom episodes, brady, or pause episodes. No new AF episodes. 3 tachy episodes- 2 w/ ECGs appear SVT, longest 12 minutes. +metoprolol.  Monthly summary reports and ROV/PRN

## 2016-08-16 ENCOUNTER — Ambulatory Visit (INDEPENDENT_AMBULATORY_CARE_PROVIDER_SITE_OTHER): Payer: Medicare Other | Admitting: *Deleted

## 2016-08-16 DIAGNOSIS — I639 Cerebral infarction, unspecified: Secondary | ICD-10-CM

## 2016-08-17 NOTE — Progress Notes (Signed)
Carelink Summary Report 

## 2016-08-19 LAB — CUP PACEART REMOTE DEVICE CHECK
Date Time Interrogation Session: 20180531003525
MDC IDC PG IMPLANT DT: 20150814

## 2016-08-19 NOTE — Progress Notes (Signed)
Carelink summary report received. Battery status OK. Normal device function. No new symptom episodes, brady, or pause episodes. No new AF episodes. 1 tachy-08/10/16 10 min 14 sec V 188 BPM, ECG appears SVT ending w/ couplet PVCs. +Metoprolol 25 mg daily, EF 11/2014 55-60%. No symptoms reported in EPIC. Monthly summary reports and ROV/PRN

## 2016-09-12 ENCOUNTER — Other Ambulatory Visit: Payer: Self-pay | Admitting: Internal Medicine

## 2016-09-12 ENCOUNTER — Other Ambulatory Visit: Payer: Self-pay | Admitting: Neurology

## 2016-09-12 NOTE — Telephone Encounter (Signed)
Request for Xarelto 20mg  received; pt is 83 yrs old, Wt-98kg, Crea-1.31 on 10/15/2015 with Dr. Andrew Au office, Crcl-62.80ml/min, last seen by Chanetta Marshall on 12/02/15; Will send in a 3 month supply but pt will need labs when her recall is due.

## 2016-09-15 ENCOUNTER — Ambulatory Visit (INDEPENDENT_AMBULATORY_CARE_PROVIDER_SITE_OTHER): Payer: Medicare Other | Admitting: *Deleted

## 2016-09-15 DIAGNOSIS — I639 Cerebral infarction, unspecified: Secondary | ICD-10-CM | POA: Diagnosis not present

## 2016-09-18 NOTE — Progress Notes (Signed)
Carelink Summary Report / Loop Recorder 

## 2016-09-25 LAB — CUP PACEART REMOTE DEVICE CHECK
MDC IDC PG IMPLANT DT: 20150814
MDC IDC SESS DTM: 20180630004524

## 2016-10-16 ENCOUNTER — Ambulatory Visit (INDEPENDENT_AMBULATORY_CARE_PROVIDER_SITE_OTHER): Payer: Medicare Other | Admitting: *Deleted

## 2016-10-16 DIAGNOSIS — I639 Cerebral infarction, unspecified: Secondary | ICD-10-CM

## 2016-10-16 NOTE — Progress Notes (Signed)
Carelink Summary Report / Loop Recorder 

## 2016-10-28 LAB — CUP PACEART REMOTE DEVICE CHECK
MDC IDC PG IMPLANT DT: 20150814
MDC IDC SESS DTM: 20180730013924

## 2016-10-31 ENCOUNTER — Other Ambulatory Visit: Payer: Self-pay | Admitting: Neurology

## 2016-11-14 ENCOUNTER — Ambulatory Visit (INDEPENDENT_AMBULATORY_CARE_PROVIDER_SITE_OTHER): Payer: Medicare Other | Admitting: *Deleted

## 2016-11-14 DIAGNOSIS — I480 Paroxysmal atrial fibrillation: Secondary | ICD-10-CM

## 2016-11-15 LAB — CUP PACEART REMOTE DEVICE CHECK
Date Time Interrogation Session: 20180829022253
Implantable Pulse Generator Implant Date: 20150814

## 2016-11-15 NOTE — Progress Notes (Signed)
Loop Recorder Summary Report 

## 2016-12-06 ENCOUNTER — Other Ambulatory Visit: Payer: Self-pay | Admitting: Internal Medicine

## 2016-12-06 DIAGNOSIS — I493 Ventricular premature depolarization: Secondary | ICD-10-CM

## 2016-12-13 ENCOUNTER — Other Ambulatory Visit: Payer: Self-pay | Admitting: Internal Medicine

## 2016-12-13 DIAGNOSIS — I493 Ventricular premature depolarization: Secondary | ICD-10-CM

## 2016-12-14 ENCOUNTER — Ambulatory Visit (INDEPENDENT_AMBULATORY_CARE_PROVIDER_SITE_OTHER): Payer: Medicare Other | Admitting: *Deleted

## 2016-12-14 DIAGNOSIS — I639 Cerebral infarction, unspecified: Secondary | ICD-10-CM | POA: Diagnosis not present

## 2016-12-14 NOTE — Telephone Encounter (Signed)
Medication Detail    Disp Refills Start End   metoprolol succinate (TOPROL-XL) 25 MG 24 hr tablet 30 tablet 0 12/06/2016    Sig - Route: TAKE 1 TABLET (25 MG TOTAL) BY MOUTH DAILY. - Oral   Sent to pharmacy as: metoprolol succinate (TOPROL-XL) 25 MG 24 hr tablet   Notes to Pharmacy: Please call our office to schedule an yearly appointment with the Doctor before anymore refills. [phone]. Thank you 1st attempt   E-Prescribing Status: Receipt confirmed by pharmacy (12/06/2016 1:23 PM EDT)   Associated Diagnoses   PVC's (premature ventricular contractions)     Pharmacy   [id number] - [place], [place] - [address]

## 2016-12-15 ENCOUNTER — Encounter: Payer: Self-pay | Admitting: Nurse Practitioner

## 2016-12-15 LAB — CUP PACEART REMOTE DEVICE CHECK
Implantable Pulse Generator Implant Date: 20150814
MDC IDC SESS DTM: 20180928023903

## 2016-12-15 NOTE — Progress Notes (Signed)
Carelink Summary Report / Loop Recorder 

## 2016-12-26 NOTE — Progress Notes (Signed)
Electrophysiology Office Note Date: 12/28/2016  ID:  Edward Maynard, Edward Maynard 19-Sep-1933, MRN [id number]  PCP: Gaynelle Arabian, MD Electrophysiologist: Rayann Heman  CC: atrial fibrillation/PVC follow up  Edward Maynard is a 83 y.o. male seen today for Dr Rayann Heman.  He underwent ILR implantation for cryptogenic stroke and atrial fibrillation was identified.  He presents today for routine electrophysiology followup.  Since last being seen in our clinic, the patient reports doing very well.  He denies chest pain, palpitations, dyspnea, PND, orthopnea, nausea, vomiting, dizziness, syncope, edema, weight gain, or early satiety.  He remains active playing golf without chest pain or shortness of breath. He has no awareness of AF or tachycardia.    Device History: MDT ILR implanted 10/2013 for cryptogenic stroke  Past Medical History:  Diagnosis Date  . High cholesterol    05/2013; no defecits  . Hyperlipemia   . Left arm weakness   . Left hand weakness   . Personal history of colonic polyp - adenoma  10/23/2013  . Stroke [facility]) 05/2013   MRI on 07/02/13 = Multifocal acute & subacute infarction involving right frontal MCA/ACA & left parietal MCA/PCA watershed areas   Past Surgical History:  Procedure Laterality Date  . LOOP RECORDER IMPLANT N/A 10/31/2013   Procedure: LOOP RECORDER IMPLANT;  Surgeon: Coralyn Mark, MD;  Location: [facility] CATH LAB;  Service: Cardiovascular;  Laterality: N/A;  . SHOULDER SURGERY Bilateral 2003, 1995    Current Outpatient Prescriptions  Medication Sig Dispense Refill  . metoprolol succinate (TOPROL-XL) 25 MG 24 hr tablet Take 1 tablet (25 mg total) by mouth daily. 90 tablet 3  . rivaroxaban (XARELTO) 20 MG TABS tablet TAKE 1 TABLET BY MOUTH  DAILY WITH SUPPER 90 tablet 3  . simvastatin (ZOCOR) 40 MG tablet Take 1 tablet by mouth  daily at 6 PM. 90 tablet 3  . SHINGRIX injection      No current facility-administered medications for this visit.     Allergies:   Patient  has no known allergies.   Social History: Social History   Social History  . Marital status: Married    Spouse name: lea  . Number of children: 2  . Years of education: college   Occupational History  . retired    Social History Main Topics  . Smoking status: Former Smoker    Quit date: 03/20/1970  . Smokeless tobacco: Never Used  . Alcohol use No  . Drug use: No  . Sexual activity: Yes   Other Topics Concern  . Not on file   Social History Narrative   Patient right handed   Patient lives with his wife   Patient coffee daily   Enjoys playing golf    Family History: Family History  Problem Relation Age of Onset  . Hypertension Mother   . Hypertension Father   . Stroke Father   . COPD Brother   . Stroke Brother   . Colon cancer Neg Hx   . Heart attack Neg Hx     Review of Systems: All other systems reviewed and are otherwise negative except as noted above.   Physical Exam: VS:  BP 130/72   Pulse 62   Ht 6' (1.829 m)   Wt 218 lb (98.9 kg)   SpO2 94%   BMI 29.57 kg/m  , BMI Body mass index is 29.57 kg/m. Wt Readings from Last 3 Encounters:  12/28/16 218 lb (98.9 kg)  12/02/15 216 lb (98 kg)  07/19/15  218 lb 3.2 oz (99 kg)    GEN- The patient is elderly appearing, alert and oriented x 3 today.   HEENT: normocephalic, atraumatic; sclera clear, conjunctiva pink; hearing intact; oropharynx clear; neck supple  Lungs- Clear to ausculation bilaterally, normal work of breathing.  No wheezes, rales, rhonchi Heart- Regular rate and rhythm GI- soft, non-tender, non-distended, bowel sounds present  Extremities- no clubbing, cyanosis, or edema  MS- no significant deformity or atrophy Skin- warm and dry, no rash or lesion  Psych- euthymic mood, full affect Neuro- strength and sensation are intact   EKG:  EKG is not ordered today.  Recent Labs: No results found for requested labs within last 8760 hours.    Other studies Reviewed: Additional studies/  records that were reviewed today include: Dr Jackalyn Lombard office notes, echo  Assessment and Plan: 1.  Paroxysmal atrial fibrillation Asymptomatic Burden <0.1% by device interrogation today. Continue Xarelto for CHADS2VASC of at least 4 BMET, CBC followed by PCP ILR nearing ERI  2. PVC's, NSVT Asymptomatic Normal EF by echo 11/2014 Low burden by ILR interrogation today    Current medicines are reviewed at length with the patient today.   The patient does not have concerns regarding his medicines.  The following changes were made today:  none  Labs/ tests ordered today include: none   Disposition:   Follow up with me in 1 year   Signed, Chanetta Marshall, NP 12/28/2016 11:13 AM   [place] [place] [address] [phone] (office) [phone] (fax)

## 2016-12-28 ENCOUNTER — Ambulatory Visit (INDEPENDENT_AMBULATORY_CARE_PROVIDER_SITE_OTHER): Payer: Medicare Other | Admitting: Nurse Practitioner

## 2016-12-28 ENCOUNTER — Encounter: Payer: Self-pay | Admitting: Nurse Practitioner

## 2016-12-28 VITALS — BP 130/72 | HR 62 | Ht 72.0 in | Wt 218.0 lb

## 2016-12-28 DIAGNOSIS — I493 Ventricular premature depolarization: Secondary | ICD-10-CM | POA: Diagnosis not present

## 2016-12-28 DIAGNOSIS — I480 Paroxysmal atrial fibrillation: Secondary | ICD-10-CM | POA: Diagnosis not present

## 2016-12-28 MED ORDER — RIVAROXABAN 20 MG PO TABS: ORAL_TABLET | ORAL | 3 refills | Status: DC

## 2016-12-28 MED ORDER — METOPROLOL SUCCINATE ER 25 MG PO TB24: 25.0000 mg | ORAL_TABLET | Freq: Every day | ORAL | 3 refills | Status: DC

## 2016-12-28 MED ORDER — SIMVASTATIN 40 MG PO TABS: ORAL_TABLET | ORAL | 3 refills | Status: DC

## 2016-12-28 NOTE — Patient Instructions (Signed)
Medication Instructions:   Your physician recommends that you continue on your current medications as directed. Please refer to the Current Medication list given to you today.   If you need a refill on your cardiac medications before your next appointment, please call your pharmacy.  Labwork: NONE ORDERED  TODAY    Testing/Procedures: NONE ORDERED  TODAY    Follow-Up: Your physician wants you to follow-up in: ONE YEAR WITH  SEILER   You will receive a reminder letter in the mail two months in advance. If you don't receive a letter, please call our office to schedule the follow-up appointment.     Any Other Special Instructions Will Be Listed Below (If Applicable).                                                                                                                                                   

## 2017-01-15 ENCOUNTER — Ambulatory Visit (INDEPENDENT_AMBULATORY_CARE_PROVIDER_SITE_OTHER): Payer: Medicare Other | Admitting: *Deleted

## 2017-01-15 DIAGNOSIS — I480 Paroxysmal atrial fibrillation: Secondary | ICD-10-CM | POA: Diagnosis not present

## 2017-01-15 DIAGNOSIS — I639 Cerebral infarction, unspecified: Secondary | ICD-10-CM

## 2017-01-15 NOTE — Progress Notes (Signed)
Carelink Summary Report / Loop Recorder 

## 2017-01-18 LAB — CUP PACEART REMOTE DEVICE CHECK
Date Time Interrogation Session: 20181028024044
MDC IDC PG IMPLANT DT: 20150814

## 2017-02-01 ENCOUNTER — Telehealth: Payer: Self-pay | Admitting: *Deleted

## 2017-02-01 NOTE — Telephone Encounter (Signed)
LMOVM regarding LINQ at [facility] since 01/26/17. [facility] phone number to call back to discuss next steps.

## 2017-02-12 ENCOUNTER — Ambulatory Visit (INDEPENDENT_AMBULATORY_CARE_PROVIDER_SITE_OTHER): Payer: Medicare Other | Admitting: *Deleted

## 2017-02-12 DIAGNOSIS — I639 Cerebral infarction, unspecified: Secondary | ICD-10-CM | POA: Diagnosis not present

## 2017-02-13 NOTE — Progress Notes (Signed)
Carelink Summary Report / Loop Recorder 

## 2017-02-14 NOTE — Telephone Encounter (Signed)
LMOVM requesting call back to the [facility].  Gave direct phone number for return call.

## 2017-02-14 NOTE — Telephone Encounter (Signed)
Patient returned call.  Advised patient of LINQ at [facility].  Patient does not wish to schedule an appointment with Dr. Rayann Heman at this time and will think about his options.  He is aware to call back if he changes his mind.  Plan for f/u as scheduled with Chanetta Marshall, NP in 12/2017.  Patient is agreeable to receiving return kit shipped to his home address.  He denies questions or concerns at this time.  Unenrolled from [place], return kit ordered.

## 2017-02-27 LAB — CUP PACEART REMOTE DEVICE CHECK
Implantable Pulse Generator Implant Date: 20150814
MDC IDC SESS DTM: 20181127024119

## 2017-03-21 ENCOUNTER — Other Ambulatory Visit: Payer: Self-pay | Admitting: Internal Medicine

## 2017-03-23 ENCOUNTER — Telehealth: Payer: Self-pay | Admitting: Internal Medicine

## 2017-03-23 NOTE — Telephone Encounter (Signed)
Called pt and pt stated that [facility] mail pharmacy is sending medication but pt needed samples to help him until mail order arrives. I informed the pt that I would leave two weeks supply of Xarelto 20 mg tablet at the front desk for pt to pick up. I advised the pt that if he has any other problems, questions or concerns to call the office. Pt verbalized understanding.

## 2017-03-23 NOTE — Telephone Encounter (Signed)
Patient calling the office for samples of medication:   1.  What medication and dosage are you requesting samples for? Xarelto   2.  Are you currently out of this medication? Yes   Please call to let Edward Maynard know that we have the samples

## 2017-11-06 ENCOUNTER — Other Ambulatory Visit: Payer: Self-pay | Admitting: Nurse Practitioner

## 2017-11-08 ENCOUNTER — Other Ambulatory Visit: Payer: Self-pay | Admitting: *Deleted

## 2017-11-08 MED ORDER — RIVAROXABAN 20 MG PO TABS: ORAL_TABLET | ORAL | 0 refills | Status: DC

## 2017-11-08 NOTE — Telephone Encounter (Signed)
Pt last saw Chanetta Marshall, NP on 12/28/16, last labs 11/10/16 Creat 1.29, age 84, weight 98.9kg, CrCl 62.82, based on CrCl pt is on appropriate dosage of Xarelto 20mg  QD.  Will refill rx.

## 2017-12-21 ENCOUNTER — Other Ambulatory Visit: Payer: Self-pay | Admitting: *Deleted

## 2017-12-21 MED ORDER — RIVAROXABAN 20 MG PO TABS: ORAL_TABLET | ORAL | 0 refills | Status: DC

## 2017-12-21 NOTE — Telephone Encounter (Addendum)
Xarelto 20mg  refill request received; pt is 84 yrs old, wt-98.9kg, Crea-1.62 on 11/12/17 via pt's PCP at [facility], last seen by Chanetta Marshall on 12/28/16, CrCl-50.43ml/min. Discussed with PharmD Jinny Blossom and approved to send in send in 20mg  at this time as pt has a recall this month and will place a note to have labs redrawn at that time.  Wife called and stated that the pt was in need of a refill, advised that it was sent today at 311pm and he needs to schedule an appt with Cardiologist and they would have to call for this and she verbalized understanding. Also, pt and wife aware to call back with any questions. Wife states he is not out of his Xarelto but did not want the pt to run out.

## 2018-01-11 ENCOUNTER — Other Ambulatory Visit: Payer: Self-pay | Admitting: Nurse Practitioner

## 2018-01-11 DIAGNOSIS — I493 Ventricular premature depolarization: Secondary | ICD-10-CM

## 2018-02-08 ENCOUNTER — Other Ambulatory Visit: Payer: Self-pay | Admitting: Internal Medicine

## 2018-03-18 ENCOUNTER — Telehealth: Payer: Self-pay | Admitting: Internal Medicine

## 2018-03-18 ENCOUNTER — Telehealth: Payer: Self-pay | Admitting: Nurse Practitioner

## 2018-03-18 MED ORDER — RIVAROXABAN 20 MG PO TABS: 20.0000 mg | ORAL_TABLET | Freq: Every day | ORAL | 0 refills | Status: DC

## 2018-03-18 NOTE — Telephone Encounter (Signed)
New message   Patient calling the office for samples of medication:   1.  What medication and dosage are you requesting samples for?rivaroxaban (XARELTO) 20 MG TABS tablet  2.  Are you currently out of this medication? No

## 2018-03-18 NOTE — Telephone Encounter (Signed)
Called pt to inform him that I will leave 2 week supply of Xarelto 20 mg table at the front desk for pt to pick up and if pt has any other problems, questions or concerns to call the office. Pt verbalized understanding.

## 2018-03-18 NOTE — Telephone Encounter (Signed)
New Message     *STAT* If patient is at the pharmacy, call can be transferred to refill team.   1. Which medications need to be refilled? (please list name of each medication and dose if known) Xareto 20mg   2. Which pharmacy/location (including street and city if local pharmacy) is medication to be sent to? [facility] on [address]  3. Do they need a 30 day or 90 day supply? 90 day supply

## 2018-03-18 NOTE — Telephone Encounter (Signed)
Pt overdue for annual appt with MD. Has had labs drawn at PCP - SCr 1.25 on 02/08/18. CrCl 72mL/min. Will send in 3 month supply as requested with 0 refills, called pt to advise him he needs to schedule an appt with MD. Transferred him to main number to schedule this.

## 2018-04-03 NOTE — Progress Notes (Signed)
Electrophysiology Office Note Date: 04/04/2018  ID:  Kojo, Liby Oct 06, 1933, MRN [id number]  PCP: Gaynelle Arabian, MD Electrophysiologist: Rayann Heman  CC: AF/PVC follow up  Edward Maynard is a 85 y.o. male seen today for Dr Rayann Heman.  He presents today for routine electrophysiology followup.  Since last being seen in our clinic, the patient reports doing very well.  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 He denies chest pain, palpitations, dyspnea, PND, orthopnea, nausea, vomiting, dizziness, syncope, edema, weight gain, or early satiety.  Past Medical History:  Diagnosis Date  . High cholesterol    05/2013; no defecits  . Hyperlipemia   . Left arm weakness   . Left hand weakness   . Personal history of colonic polyp - adenoma  10/23/2013  . Stroke [facility]) 05/2013   MRI on 07/02/13 = Multifocal acute & subacute infarction involving right frontal MCA/ACA & left parietal MCA/PCA watershed areas   Past Surgical History:  Procedure Laterality Date  . LOOP RECORDER IMPLANT N/A 10/31/2013   Procedure: LOOP RECORDER IMPLANT;  Surgeon: Coralyn Mark, MD;  Location: [facility] CATH [facility];  Service: Cardiovascular;  Laterality: N/A;  . SHOULDER SURGERY Bilateral 2003, 1995    Current Outpatient Medications  Medication Sig Dispense Refill  . metoprolol succinate (TOPROL-XL) 25 MG 24 hr tablet Take 1 tablet (25 mg total) by mouth daily. Please make overdue appt for future refills. [phone]. 1st attempt. 90 tablet 3  . rivaroxaban (XARELTO) 20 MG TABS tablet Take 1 tablet (20 mg total) by mouth daily with supper. 90 tablet 0  . simvastatin (ZOCOR) 40 MG tablet Take 1 tablet by mouth  daily at 6 PM. 90 tablet 3   No current facility-administered medications for this visit.     Allergies:   Patient has no known allergies.   Social History: Social History   Socioeconomic History  . Marital status: Married    Spouse name: lea  . Number of children: 2  . Years of  education: college  . Highest education level: Not on file  Occupational History  . Occupation: retired  Scientific laboratory technician  . Financial resource strain: Not on file  . Food insecurity:    Worry: Not on file    Inability: Not on file  . Transportation needs:    Medical: Not on file    Non-medical: Not on file  Tobacco Use  . Smoking status: Former Smoker    Last attempt to quit: 03/20/1970    Years since quitting: 48.0  . Smokeless tobacco: Never Used  Substance and Sexual Activity  . Alcohol use: No  . Drug use: No  . Sexual activity: Yes  Lifestyle  . Physical activity:    Days per week: Not on file    Minutes per session: Not on file  . Stress: Not on file  Relationships  . Social connections:    Talks on phone: Not on file    Gets together: Not on file    Attends religious service: Not on file    Active member of club or organization: Not on file    Attends meetings of clubs or organizations: Not on file    Relationship status: Not on file  . Intimate partner violence:    Fear of current or ex partner: Not on file    Emotionally abused: Not on file    Physically abused: Not on file    Forced sexual activity: Not on file  Other Topics Concern  . Not on file  Social History Narrative   Patient right handed   Patient lives with his wife   Patient coffee daily   Enjoys playing golf    Family History: Family History  Problem Relation Age of Onset  . Hypertension Mother   . Hypertension Father   . Stroke Father   . COPD Brother   . Stroke Brother   . Colon cancer Neg Hx   . Heart attack Neg Hx     Review of Systems: All other systems reviewed and are otherwise negative except as noted above.   Physical Exam: VS:  BP 116/72   Pulse 65   Ht 6' (1.829 m)   Wt 223 lb 3.2 oz (101.2 kg)   SpO2 93%   BMI 30.27 kg/m  , BMI Body mass index is 30.27 kg/m. Wt Readings from Last 3 Encounters:  04/04/18 223 lb 3.2 oz (101.2 kg)  12/28/16 218 lb (98.9 kg)  12/02/15  216 lb (98 kg)    GEN- The patient is well appearing, alert and oriented x 3 today.   HEENT: normocephalic, atraumatic; sclera clear, conjunctiva pink; hearing intact; oropharynx clear; neck supple, no JVP Lymph- no cervical lymphadenopathy Lungs- Clear to ausculation bilaterally, normal work of breathing.  No wheezes, rales, rhonchi Heart- Regular rate and rhythm, no murmurs, rubs or gallops, PMI not laterally displaced GI- soft, non-tender, non-distended, bowel sounds present, no hepatosplenomegaly Extremities- no clubbing, cyanosis, or edema; DP/PT/radial pulses 2+ bilaterally MS- no significant deformity or atrophy Skin- warm and dry, no rash or lesion  Psych- euthymic mood, full affect Neuro- strength and sensation are intact   EKG:  EKG is not ordered today.  Recent Labs: No results found for requested labs within last 8760 hours.     Assessment and Plan:  1.  Paroxysmal atrial fibrillation Asymptomatic ILR at RRT. We discussed options moving forward and for now he prefers to leave the loop recorder in as he is unaware of it. Continue Xarelto for CHADS2VASC of 4 Patient can't remember last time he had labs with his PCP. Will check [facility].  2.  PVC's/NSVT Asymptomatic   Current medicines are reviewed at length with the patient today.   The patient does not have concerns regarding his medicines.  The following changes were made today:  none  Labs/ tests ordered today include: none Orders Placed This Encounter  Procedures  . Basic Metabolic Panel (BMET)     Disposition:   Follow up with EP NP in 1 year     Gwenlyn Perking PA-C 04/04/2018 9:34 AM   [facility] [address] [place] [address] [phone] (office) [phone] (fax)

## 2018-04-04 ENCOUNTER — Ambulatory Visit: Payer: Medicare Other | Admitting: Nurse Practitioner

## 2018-04-04 ENCOUNTER — Encounter: Payer: Self-pay | Admitting: Nurse Practitioner

## 2018-04-04 VITALS — BP 116/72 | HR 65 | Ht 72.0 in | Wt 223.2 lb

## 2018-04-04 DIAGNOSIS — Z79899 Other long term (current) drug therapy: Secondary | ICD-10-CM

## 2018-04-04 DIAGNOSIS — I480 Paroxysmal atrial fibrillation: Secondary | ICD-10-CM

## 2018-04-04 DIAGNOSIS — I493 Ventricular premature depolarization: Secondary | ICD-10-CM | POA: Diagnosis not present

## 2018-04-04 LAB — BASIC METABOLIC PANEL
BUN/Creatinine Ratio: 17 (ref 10–24)
BUN: 20 mg/dL (ref 8–27)
CALCIUM: 9.2 mg/dL (ref 8.6–10.2)
CO2: 23 mmol/L (ref 20–29)
CREATININE: 1.19 mg/dL (ref 0.76–1.27)
Chloride: 101 mmol/L (ref 96–106)
GFR calc Af Amer: 65 mL/min/{1.73_m2} (ref >59)
GFR, EST NON AFRICAN AMERICAN: 57 mL/min/{1.73_m2} — AB (ref >59)
Glucose: 105 mg/dL — ABNORMAL HIGH (ref 65–99)
Potassium: 4.2 mmol/L (ref 3.5–5.2)
Sodium: 140 mmol/L (ref 134–144)

## 2018-04-04 NOTE — Patient Instructions (Signed)
Medication Instructions:  NONE If you need a refill on your cardiac medications before your next appointment, please call your pharmacy.   Lab work:TODAY BMET If you have labs (blood work) drawn today and your tests are completely normal, you will receive your results only by: Marland Kitchen MyChart Message (if you have MyChart) OR . A paper copy in the mail If you have any lab test that is abnormal or we need to change your treatment, we will call you to review the results.  Testing/Procedures: NONE  Follow-Up:1 YEAR WITH Administrator, arts  At [facility], you and your health needs are our priority.  As part of our continuing mission to provide you with exceptional heart care, we have created designated Provider Care Teams.  These Care Teams include your primary Cardiologist (physician) and Advanced Practice Providers (APPs -  Physician Assistants and Nurse Practitioners) who all work together to provide you with the care you need, when you need it. .   Any Other Special Instructions Will Be Listed Below (If Applicable).

## 2018-04-08 ENCOUNTER — Telehealth: Payer: Self-pay

## 2018-04-08 NOTE — Telephone Encounter (Signed)
-----   Message from Patsey Berthold, NP sent at 04/04/2018  7:01 PM EST ----- Please notify patient of stable labs. Thanks!

## 2018-04-08 NOTE — Telephone Encounter (Signed)
-----   Message from Edward Berthold, NP sent at 04/04/2018  7:01 PM EST ----- Please notify patient of stable labs. Thanks!

## 2018-04-08 NOTE — Telephone Encounter (Signed)
Notes recorded by Frederik Schmidt, RN on 04/08/2018 at 8:44 AM EST lpmtcb (LABS) 1/20 ------

## 2018-04-08 NOTE — Telephone Encounter (Signed)
Notes recorded by Frederik Schmidt, RN on 04/08/2018 at 10:31 AM EST The patient has been notified of the result and verbalized understanding. All questions (if any) were answered. Frederik Schmidt, RN 04/08/2018 10:31 AM

## 2018-08-07 ENCOUNTER — Other Ambulatory Visit: Payer: Self-pay | Admitting: Internal Medicine

## 2018-08-07 NOTE — Telephone Encounter (Signed)
Age 85, weight 101.2kg, SCr, SCr 1.19 on 04/04/18, CrCl 68.5.

## 2018-08-16 ENCOUNTER — Other Ambulatory Visit: Payer: Self-pay | Admitting: Family Medicine

## 2018-08-16 DIAGNOSIS — M79604 Pain in right leg: Secondary | ICD-10-CM

## 2018-08-26 ENCOUNTER — Other Ambulatory Visit: Payer: Self-pay

## 2018-08-26 ENCOUNTER — Ambulatory Visit
Admission: RE | Admit: 2018-08-26 | Discharge: 2018-08-26 | Disposition: A | Discharge status: Home / Self Care | Payer: Medicare Other | Source: Ambulatory Visit | Attending: Family Medicine | Admitting: Family Medicine

## 2018-08-26 DIAGNOSIS — M79604 Pain in right leg: Secondary | ICD-10-CM

## 2019-01-06 ENCOUNTER — Other Ambulatory Visit: Payer: Self-pay | Admitting: Internal Medicine

## 2019-01-06 NOTE — Telephone Encounter (Signed)
Xarelto 20mg  refill request received; pt is 85 years old, weight-101.2kg, Crea-1.19 on 04/04/2018, last seen by Chanetta Marshall on 04/04/2018, Diagnosis-Afib, CrCl-68.104ml/min; Dose is appropriate based on dosing criteria. Will send in refill to requested pharmacy.

## 2019-01-30 ENCOUNTER — Ambulatory Visit: Payer: Medicare Other | Admitting: Podiatry

## 2019-02-06 ENCOUNTER — Ambulatory Visit: Payer: Medicare Other | Admitting: Podiatry

## 2019-02-06 ENCOUNTER — Encounter: Payer: Self-pay | Admitting: Podiatry

## 2019-02-06 ENCOUNTER — Other Ambulatory Visit: Payer: Self-pay

## 2019-02-06 DIAGNOSIS — B351 Tinea unguium: Secondary | ICD-10-CM | POA: Diagnosis not present

## 2019-02-06 DIAGNOSIS — M79674 Pain in right toe(s): Secondary | ICD-10-CM

## 2019-02-06 DIAGNOSIS — Z7901 Long term (current) use of anticoagulants: Secondary | ICD-10-CM

## 2019-02-06 DIAGNOSIS — M79675 Pain in left toe(s): Secondary | ICD-10-CM

## 2019-02-06 MED ORDER — CICLOPIROX 8 % EX SOLN: Freq: Every day | CUTANEOUS | 2 refills | Status: DC

## 2019-02-06 NOTE — Patient Instructions (Signed)
Ciclopirox nail solution What is this medicine? CICLOPIROX (sye kloe PEER ox) NAIL SOLUTION is an antifungal medicine. It used to treat fungal infections of the nails. This medicine may be used for other purposes; ask your health care provider or pharmacist if you have questions. COMMON BRAND NAME(S): [id number], Penlac What should I tell my health care provider before I take this medicine? They need to know if you have any of these conditions:  diabetes mellitus  history of seizures  HIV infection  immune system problems or organ transplant  large areas of burned or damaged skin  peripheral vascular disease or poor circulation  taking corticosteroid medication (including steroid inhalers, cream, or lotion)  an unusual or allergic reaction to ciclopirox, isopropyl alcohol, other medicines, foods, dyes, or preservatives  pregnant or trying to get pregnant  breast-feeding How should I use this medicine? This medicine is for external use only. Follow the directions that come with this medicine exactly. Wash and dry your hands before use. Avoid contact with the eyes, mouth or nose. If you do get this medicine in your eyes, rinse out with plenty of cool tap water. Contact your doctor or health care professional if eye irritation occurs. Use at regular intervals. Do not use your medicine more often than directed. Finish the full course prescribed by your doctor or health care professional even if you think you are better. Do not stop using except on your doctor's advice. Talk to your pediatrician regarding the use of this medicine in children. While this medicine may be prescribed for children as young as 12 years for selected conditions, precautions do apply. Overdosage: If you think you have taken too much of this medicine contact a poison control center or emergency room at once. NOTE: This medicine is only for you. Do not share this medicine with others. What if I miss a dose? If you miss a  dose, use it as soon as you can. If it is almost time for your next dose, use only that dose. Do not use double or extra doses. What may interact with this medicine? Interactions are not expected. Do not use any other skin products without telling your doctor or health care professional. This list may not describe all possible interactions. Give your health care provider a list of all the medicines, herbs, non-prescription drugs, or dietary supplements you use. Also tell them if you smoke, drink alcohol, or use illegal drugs. Some items may interact with your medicine. What should I watch for while using this medicine? Tell your doctor or health care professional if your symptoms get worse. Four to six months of treatment may be needed for the nail(s) to improve. Some people may not achieve a complete cure or clearing of the nails by this time. Tell your doctor or health care professional if you develop sores or blisters that do not heal properly. If your nail infection returns after stopping using this product, contact your doctor or health care professional. What side effects may I notice from receiving this medicine? Side effects that you should report to your doctor or health care professional as soon as possible:  allergic reactions like skin rash, itching or hives, swelling of the face, lips, or tongue  severe irritation, redness, burning, blistering, peeling, swelling, oozing Side effects that usually do not require medical attention (report to your doctor or health care professional if they continue or are bothersome):  mild reddening of the skin  nail discoloration  temporary burning or mild   stinging at the site of application This list may not describe all possible side effects. Call your doctor for medical advice about side effects. You may report side effects to FDA at 1-800-FDA-1088. Where should I keep my medicine? Keep out of the reach of children. Store at room temperature  between 15 and 30 degrees C (59 and 86 degrees F). Do not freeze. Protect from light by storing the bottle in the carton after every use. This medicine is flammable. Keep away from heat and flame. Throw away any unused medicine after the expiration date. NOTE: This sheet is a summary. It may not cover all possible information. If you have questions about this medicine, talk to your doctor, pharmacist, or health care provider.  2020 Elsevier/Gold Standard (2007-06-10 16:49:20)  

## 2019-02-12 NOTE — Progress Notes (Signed)
Subjective:   Patient ID: Edward Maynard, male   DOB: 85 y.o.   MRN: [id number]   HPI 85 year old male presents outside for toenail issues.  He says the nails are thickened discolored.  He states that occasionally with some dark discoloration of the nails likely from bleeding.  He has tried tea tree oil which she did not think helped.  The right fourth toenails come off.  No redness or drainage or any swelling to the toenail sites.  No other concerns today.  He is on Xarelto   Review of Systems  All other systems reviewed and are negative.  Past Medical History:  Diagnosis Date  . High cholesterol    05/2013; no defecits  . Hyperlipemia   . Left arm weakness   . Left hand weakness   . Personal history of colonic polyp - adenoma  10/23/2013  . Stroke [facility]) 05/2013   MRI on 07/02/13 = Multifocal acute & subacute infarction involving right frontal MCA/ACA & left parietal MCA/PCA watershed areas    Past Surgical History:  Procedure Laterality Date  . LOOP RECORDER IMPLANT N/A 10/31/2013   Procedure: LOOP RECORDER IMPLANT;  Surgeon: Coralyn Mark, MD;  Location: [facility]ATH [facility];  Service: Cardiovascular;  Laterality: N/A;  . SHOULDER SURGERY Bilateral 2003, 1995     Current Outpatient Medications:  .  ciclopirox (PENLAC) 8 % solution, Apply topically at bedtime. Apply over nail and surrounding skin. Apply daily over previous coat. After seven (7) days, may remove with alcohol and continue cycle., Disp: 6.6 mL, Rfl: 2 .  metoprolol succinate (TOPROL-XL) 25 MG 24 hr tablet, Take 1 tablet (25 mg total) by mouth daily. Please make overdue appt for future refills. [phone]. 1st attempt., Disp: 90 tablet, Rfl: 3 .  simvastatin (ZOCOR) 40 MG tablet, Take 1 tablet by mouth  daily at 6 PM., Disp: 90 tablet, Rfl: 3 .  XARELTO 20 MG TABS tablet, TAKE 1 TABLET BY MOUTH  DAILY WITH SUPPER, Disp: 90 tablet, Rfl: 1  No Known Allergies       Objective:  Physical Exam  General: AAO x3,  NAD  Dermatological: The nails appear to be hypertrophic, dystrophic, discolored with yellow-brown discoloration.  There is some dried blood around the toenails as well.  Is able to debride the nails and dried blood was able to be removed.  There is no extension of any other hyperpigmentation.  The right fourth toenail is not present.  No open lesions.  Vascular: Dorsalis Pedis artery and Posterior Tibial artery pedal pulses are 2/4 bilateral with immedate capillary fill time. Pedal hair growth present. No varicosities and no lower extremity edema present bilateral. There is no pain with calf compression, swelling, warmth, erythema.   Neruologic: Grossly intact via light touch bilateral. Vibratory intact via tuning fork bilateral.  Musculoskeletal: No gross boney pedal deformities bilateral. No pain, crepitus, or limitation noted with foot and ankle range of motion bilateral. Muscular strength 5/5 in all groups tested bilateral.  Gait: Unassisted, Nonantalgic.       Assessment:   Symptomatic onychomycosis; on Xarelto    Plan:  -Treatment options discussed including all alternatives, risks, and complications -Etiology of symptoms were discussed -Debrided the nails x9 without any complications or bleeding.  Discussed treatment options for the nail fungus.  Prescribed Penlac.  We discussed shoe modifications to avoid any pressure to the toes.  Trula Slade DPM

## 2019-04-10 NOTE — Progress Notes (Signed)
Electrophysiology Office Note Date: 04/11/2019  ID:  Edward, Maynard [birth year], MRN [id number]  PCP: Gaynelle Arabian, MD Electrophysiologist: Rayann Heman  CC: AF/PVC follow up  Edward Maynard is a 86 y.o. male seen today for Dr Rayann Heman.  He presents today for routine electrophysiology followup.  Since last being seen in our clinic, the patient reports doing reasonably well.  He continues to play golf twice weekly and has stable fatigue with exertion.   He denies chest pain, palpitations, dyspnea, PND, orthopnea, nausea, vomiting, dizziness, syncope, edema, weight gain, or early satiety.  Past Medical History:  Diagnosis Date  . High cholesterol    05/2013; no defecits  . Hyperlipemia   . Left arm weakness   . Left hand weakness   . Personal history of colonic polyp - adenoma  10/23/2013  . Stroke [facility]) 05/2013   MRI on 07/02/13 = Multifocal acute & subacute infarction involving right frontal MCA/ACA & left parietal MCA/PCA watershed areas   Past Surgical History:  Procedure Laterality Date  . LOOP RECORDER IMPLANT N/A 10/31/2013   Procedure: LOOP RECORDER IMPLANT;  Surgeon: Coralyn Mark, MD;  Location: [place] CATH LAB;  Service: Cardiovascular;  Laterality: N/A;  . SHOULDER SURGERY Bilateral 2003, 1995    Current Outpatient Medications  Medication Sig Dispense Refill  . ciclopirox (PENLAC) 8 % solution Apply topically at bedtime. Apply over nail and surrounding skin. Apply daily over previous coat. After seven (7) days, may remove with alcohol and continue cycle. 6.6 mL 2  . metoprolol succinate (TOPROL-XL) 25 MG 24 hr tablet Take 1 tablet (25 mg total) by mouth daily. Please make overdue appt for future refills. [phone]. 1st attempt. 90 tablet 3  . simvastatin (ZOCOR) 40 MG tablet Take 1 tablet by mouth  daily at 6 PM. 90 tablet 3  . XARELTO 20 MG TABS tablet TAKE 1 TABLET BY MOUTH  DAILY WITH SUPPER 90 tablet 1   No current facility-administered medications for this visit.     Allergies:   Patient has no known allergies.   Social History: Social History   Socioeconomic History  . Marital status: Married    Spouse name: lea  . Number of children: 2  . Years of education: college  . Highest education level: Not on file  Occupational History  . Occupation: retired  Tobacco Use  . Smoking status: Former Smoker    Quit date: 03/20/1970    Years since quitting: 49.0  . Smokeless tobacco: Never Used  Substance and Sexual Activity  . Alcohol use: No  . Drug use: No  . Sexual activity: Yes  Other Topics Concern  . Not on file  Social History Narrative   Patient right handed   Patient lives with his wife   Patient coffee daily   Enjoys playing golf   Social Determinants of Health   Financial Resource Strain:   . Difficulty of Paying Living Expenses: Not on file  Food Insecurity:   . Worried About Charity fundraiser in the Last Year: Not on file  . Ran Out of Food in the Last Year: Not on file  Transportation Needs:   . Lack of Transportation (Medical): Not on file  . Lack of Transportation (Non-Medical): Not on file  Physical Activity:   . Days of Exercise per Week: Not on file  . Minutes of Exercise per Session: Not on file  Stress:   . Feeling of Stress : Not on file  Social  Connections:   . Frequency of Communication with Friends and Family: Not on file  . Frequency of Social Gatherings with Friends and Family: Not on file  . Attends Religious Services: Not on file  . Active Member of Clubs or Organizations: Not on file  . Attends Archivist Meetings: Not on file  . Marital Status: Not on file  Intimate Partner Violence:   . Fear of Current or Ex-Partner: Not on file  . Emotionally Abused: Not on file  . Physically Abused: Not on file  . Sexually Abused: Not on file    Family History: Family History  Problem Relation Age of Onset  . Hypertension Mother   . Hypertension Father   . Stroke Father   . COPD Brother   .  Stroke Brother   . Colon cancer Neg Hx   . Heart attack Neg Hx     Review of Systems: All other systems reviewed and are otherwise negative except as noted above.   Physical Exam: VS:  BP 108/66   Pulse 80   Ht 6' (1.829 m)   Wt 221 lb 9.6 oz (100.5 kg)   SpO2 99%   BMI 30.05 kg/m  , BMI Body mass index is 30.05 kg/m. Wt Readings from Last 3 Encounters:  04/11/19 221 lb 9.6 oz (100.5 kg)  04/04/18 223 lb 3.2 oz (101.2 kg)  12/28/16 218 lb (98.9 kg)    GEN- The patient is well appearing, alert and oriented x 3 today.   HEENT: normocephalic, atraumatic; sclera clear, conjunctiva pink; hearing intact; oropharynx clear; neck supple, no JVP Lymph- no cervical lymphadenopathy Lungs- Clear to ausculation bilaterally, normal work of breathing.  No wheezes, rales, rhonchi Heart- Regular rate and rhythm, no murmurs, rubs or gallops, PMI not laterally displaced GI- soft, non-tender, non-distended, bowel sounds present, no hepatosplenomegaly Extremities- no clubbing, cyanosis, or edema; DP/PT/radial pulses 2+ bilaterally MS- no significant deformity or atrophy Skin- warm and dry, no rash or lesion  Psych- euthymic mood, full affect Neuro- strength and sensation are intact   EKG:  EKG is ordered today. The ekg ordered today shows sinus rhythm, rate 80, PR 222  Recent Labs: No results found for requested labs within last 8760 hours.     Assessment and Plan:  1.  Paroxysmal atrial fibrillation Asymptomatic Prior ILR at RRT - he has decided against extraction Continue Xarelto for CHADS2VASC of 4 BMET/CBC followed by PCP   2.  PVC's Asymptomatic    Current medicines are reviewed at length with the patient today.   The patient does not have concerns regarding his medicines.  The following changes were made today:  none  Labs/ tests ordered today include: none Orders Placed This Encounter  Procedures  . EKG 12-Lead     Disposition:   Follow up with me in 1 year     Signed, Chanetta Marshall, NP 04/11/2019 9:09 AM   [facility] [place]  [phone] (office) [phone] (fax)

## 2019-04-11 ENCOUNTER — Encounter: Payer: Self-pay | Admitting: Nurse Practitioner

## 2019-04-11 ENCOUNTER — Encounter (INDEPENDENT_AMBULATORY_CARE_PROVIDER_SITE_OTHER): Payer: Self-pay

## 2019-04-11 ENCOUNTER — Other Ambulatory Visit: Payer: Self-pay

## 2019-04-11 ENCOUNTER — Ambulatory Visit: Payer: Medicare Other | Admitting: Nurse Practitioner

## 2019-04-11 VITALS — BP 108/66 | HR 80 | Ht 72.0 in | Wt 221.6 lb

## 2019-04-11 DIAGNOSIS — I493 Ventricular premature depolarization: Secondary | ICD-10-CM

## 2019-04-11 DIAGNOSIS — I480 Paroxysmal atrial fibrillation: Secondary | ICD-10-CM

## 2019-04-11 NOTE — Patient Instructions (Addendum)
Medication Instructions:  none *If you need a refill on your cardiac medications before your next appointment, please call your pharmacy*  Lab Work: none If you have labs (blood work) drawn today and your tests are completely normal, you will receive your results only by: Marland Kitchen MyChart Message (if you have MyChart) OR . A paper copy in the mail If you have any lab test that is abnormal or we need to change your treatment, we will call you to review the results.  Testing/Procedures: none  Follow-Up: At [facility], you and your health needs are our priority.  As part of our continuing mission to provide you with exceptional heart care, we have created designated Provider Care Teams.  These Care Teams include your primary Cardiologist (physician) and Advanced Practice Providers (APPs -  Physician Assistants and Nurse Practitioners) who all work together to provide you with the care you need, when you need it.  Your next appointment:   1 year(s)  The format for your next appointment:   In Person  Provider:   Chanetta Marshall, NP  Other Instructions

## 2019-05-02 ENCOUNTER — Ambulatory Visit: Payer: Medicare Other | Attending: Internal Medicine

## 2019-05-02 DIAGNOSIS — Z23 Encounter for immunization: Secondary | ICD-10-CM

## 2019-05-02 NOTE — Progress Notes (Signed)
   Covid-19 Vaccination Clinic  Name:  Edward Maynard    MRN: [id number] DOB: 1933/10/14  05/02/2019  Edward Maynard was observed post Covid-19 immunization for 15 minutes without incidence. He was provided with Vaccine Information Sheet and instruction to access the V-Safe system.   Edward Maynard was instructed to call 911 with any severe reactions post vaccine: Marland Kitchen Difficulty breathing  . Swelling of your face and throat  . A fast heartbeat  . A bad rash all over your body  . Dizziness and weakness    Immunizations Administered    Name Date Dose VIS Date Route   Pfizer COVID-19 Vaccine 05/02/2019  6:09 PM 0.3 mL 02/28/2019 Intramuscular   Manufacturer: [place]   Lot: [id number]   [place]: [id number]

## 2019-05-12 ENCOUNTER — Ambulatory Visit: Payer: Medicare Other | Admitting: Podiatry

## 2019-05-12 ENCOUNTER — Encounter: Payer: Self-pay | Admitting: Podiatry

## 2019-05-12 ENCOUNTER — Other Ambulatory Visit: Payer: Self-pay

## 2019-05-12 VITALS — Temp 97.6°F

## 2019-05-12 DIAGNOSIS — B351 Tinea unguium: Secondary | ICD-10-CM | POA: Diagnosis not present

## 2019-05-12 DIAGNOSIS — M79674 Pain in right toe(s): Secondary | ICD-10-CM | POA: Diagnosis not present

## 2019-05-12 DIAGNOSIS — Z7901 Long term (current) use of anticoagulants: Secondary | ICD-10-CM | POA: Diagnosis not present

## 2019-05-12 DIAGNOSIS — M79675 Pain in left toe(s): Secondary | ICD-10-CM

## 2019-05-12 NOTE — Progress Notes (Signed)
Subjective: 86 y.o. returns the office today for painful, elongated, thickened toenails which he cannot trim himself. Was prescribed penlac last appointment. The Penlac was helping but due to cost he did not refill it and is asking about using OTC fungi-nail. Denies any redness or drainage around the nails. Denies any acute changes since last appointment and no new complaints today. Denies any systemic complaints such as fevers, chills, nausea, vomiting.   He is on xeralto  PCP: Gaynelle Arabian, MD  Objective: AAO 3, NAD DP/PT pulses palpable, CRT less than 3 seconds Nails hypertrophic, dystrophic, elongated, brittle, discolored 10. There is tenderness overlying the nails 1-5 bilaterally. There is no surrounding erythema or drainage along the nail sites.There was some old dry blood under the right 4th toenail and it is more brittle and thick compared to the others. There is no hyperpigmented changes to the surrounding nail or toe.  No open lesions or pre-ulcerative lesions are identified. No other areas of tenderness bilateral lower extremities. No overlying edema, erythema, increased warmth. No pain with calf compression, swelling, warmth, erythema.  Assessment: Patient presents with symptomatic onychomycosis  Plan: -Treatment options including alternatives, risks, complications were discussed -Nails sharply debrided 10 without complication/bleeding. Continue fungi-nail. -Discussed daily foot inspection. If there are any changes, to call the office immediately.  -Follow-up in 3 months or sooner if any problems are to arise. In the meantime, encouraged to call the office with any questions, concerns, changes symptoms.  Celesta Gentile, DPM

## 2019-05-26 ENCOUNTER — Ambulatory Visit: Payer: Medicare Other | Attending: Internal Medicine

## 2019-05-26 DIAGNOSIS — Z23 Encounter for immunization: Secondary | ICD-10-CM | POA: Insufficient documentation

## 2019-05-26 NOTE — Progress Notes (Signed)
   Covid-19 Vaccination Clinic  Name:  Edward Maynard    MRN: [id number] DOB: Sep 16, 1933  05/26/2019  [name] was observed post Covid-19 immunization for 15 minutes without incident. He was provided with Vaccine Information Sheet and instruction to access the V-Safe system.   [name] was instructed to call 911 with any severe reactions post vaccine: Marland Kitchen Difficulty breathing  . Swelling of face and throat  . A fast heartbeat  . A bad rash all over body  . Dizziness and weakness   Immunizations Administered    Name Date Dose VIS Date Route   Pfizer COVID-19 Vaccine 05/26/2019  8:23 AM 0.3 mL 02/28/2019 Intramuscular   Manufacturer: [place]   Lot: [id number]   [place]: [id number]

## 2019-08-08 ENCOUNTER — Other Ambulatory Visit: Payer: Self-pay | Admitting: Internal Medicine

## 2019-08-08 NOTE — Telephone Encounter (Signed)
Prescription refill request for Xarelto received.   Last office visit: Edward Maynard 04/11/2019 Weight: 100.5 kg  Age: 86 y.o. Scr: 1.35, 11/19/2018 CrCl: 58 ml/min   Prescription refill sent.

## 2019-08-12 ENCOUNTER — Ambulatory Visit: Payer: Medicare Other | Admitting: Podiatry

## 2019-08-25 ENCOUNTER — Telehealth: Payer: Self-pay | Admitting: Physician Assistant

## 2019-08-25 NOTE — Telephone Encounter (Signed)
New Messaage:    Pt says he is in the donut hole for his Xarelto. He said he was told if this happen, to call and see if he could change to another medicine.

## 2019-08-25 NOTE — Telephone Encounter (Signed)
**Note De-Identified  Obfuscation** I s/w the pt concerning Edward Maynard applying for asst for Edward Xarelto through [employer] pt asst program and he is interested in applying.   The pt requested that I s/w Edward Maynard. I answered all their questions concerning the [employer] pt asst program that I could and gave them [employer] phone number [phone]) to call with questions that I could not answer and to request that they mail them a pt asst application.  I did advise Maynard that they can drop the pt asst application and all documents required by [employer] off at the office once completed and that we will take care of the provider part and will fax all to [employer] pt asst program.  Maynard thanked me for calling them with this information.

## 2019-09-04 NOTE — Telephone Encounter (Signed)
[name] the patient's wife is calling requesting to speak with Edward Maynard in regards to the application. Please advise.

## 2019-09-04 NOTE — Telephone Encounter (Signed)
**Note De-Identified  Obfuscation** I called [name] (the pts wife) back and assisted her with filling out the pts Wynetta [employer] [place] pt asst application for Xarelto over the phone. She states that she is bringing the completed application to the office to drop off so we can take care of the provider page, have Dr Rayann Heman sign it and fax all to [employer] pt asst program.  She states that the pt currently has 27 Xarelto 20 mg tablets on hand and is aware to call us if he runs low while we are working on this.

## 2019-09-05 NOTE — Telephone Encounter (Signed)
**Note De-Identified  Obfuscation** The pts application was left at the office. Question number 2: Financial Information was not filled in. I called the pt but got no answer so I left a message on the pts VM asking that either he or his wife  call me back concerning his [employer] Pt Asst application.

## 2019-09-10 NOTE — Telephone Encounter (Signed)
**Note De-Identified  Obfuscation** I called the pt  And was able to reach him. He advised me of how much his household income is and gave verbal permission for me to write the amount on his [place] pt asst application for his Xarelto.  I have completed the provider part of the application, scanned and e-mailed all to Dr Jackalyn Lombard nurse so she can obtain his signature, sign, date and to fax all to [employer] Pt Asst Program at fax number written on cover letter.

## 2019-09-11 NOTE — Telephone Encounter (Signed)
Application faxed as requested.  Fax confirmation received.

## 2019-09-29 ENCOUNTER — Telehealth: Payer: Self-pay | Admitting: Nurse Practitioner

## 2019-09-29 NOTE — Telephone Encounter (Signed)
Follow Up:    Please call, concerning the status of his paperwork he left for his Xarelto please.

## 2019-09-29 NOTE — Telephone Encounter (Signed)
Patient calling the office for samples of medication:   1.  What medication and dosage are you requesting samples for? Xarelto  2.  Are you currently out of this medication?  A few

## 2019-09-30 NOTE — Telephone Encounter (Signed)
**Note De-Identified  Obfuscation** Since the pt has already applied for pt asst for his Xarelto I called [employer] Pt Asst program and was advised by Rosann Auerbach that they did receive the pts application on 7/35 and that it has not been processed yet as they are a little behind.  I called the pt and made him aware of the hold up in the process of his application. I did give him [employer] pt asst phone number so he can call to check the status of his application from time to time.  Also, he is aware that we are leaving him 2 bottles of Xarelto 20 mg samples in the front office for him to pick up as he is almost out.  He thanked me for our assistance.

## 2019-09-30 NOTE — Telephone Encounter (Signed)
Lynn, LPN, can you please advise on this matter? Thanks 

## 2019-09-30 NOTE — Telephone Encounter (Signed)
**Note De-Identified  Obfuscation** I called Edward Mayn[facility] Pt Asst program and was advised by Edward Maynard that they did receive the pts application on 8/50 and that it has not been processed yet as they are a little behind.  I called the pt and made him aware of the hold up in the process of his application. I did give him [employer] pt asst phone number so he can call to check the status of his application from time to time.  Also, he is aware that we are leaving him 2 bottles of Xarelto 20 mg samples in the front office for him to pick up as he is almost out.  He thanked me for our assistance.

## 2019-10-13 NOTE — Telephone Encounter (Signed)
**Note De-Identified  Obfuscation** Letter received from [employer] Pt Asst program stating that they denied the pt for asst with his Xarelto. Reason: The pt currently has ins. coverage for his Xarelto.  The letter states that they have notified the pt of this denial as well. Record ID: [id number]

## 2020-02-02 ENCOUNTER — Other Ambulatory Visit: Payer: Self-pay | Admitting: Internal Medicine

## 2020-02-02 NOTE — Telephone Encounter (Addendum)
Prescription refill request for Xarelto received.  Indication: afib Last office visit: 04/11/2019, Lynnell Jude Weight: 100.5 kg Age: 86 Scr: 1.33, 12/04/2019 CrCl: 59 ml/min    Prescription refill sent.

## 2020-03-22 DIAGNOSIS — H6123 Impacted cerumen, bilateral: Secondary | ICD-10-CM | POA: Diagnosis not present

## 2020-04-21 DIAGNOSIS — H34811 Central retinal vein occlusion, right eye, with macular edema: Secondary | ICD-10-CM | POA: Diagnosis not present

## 2020-06-01 DIAGNOSIS — M67911 Unspecified disorder of synovium and tendon, right shoulder: Secondary | ICD-10-CM | POA: Diagnosis not present

## 2020-08-18 DIAGNOSIS — H34811 Central retinal vein occlusion, right eye, with macular edema: Secondary | ICD-10-CM | POA: Diagnosis not present

## 2020-08-26 ENCOUNTER — Other Ambulatory Visit: Payer: Self-pay | Admitting: Internal Medicine

## 2020-08-26 NOTE — Telephone Encounter (Addendum)
Prescription refill request for Xarelto received.  Indication:Afib Last office visit: 04/11/2019, seiler Weight: 100.5 kg Age: 87 yo  Scr: 1.33, 9/16 2021 CrCl: 58 ml/min   Pt is overdue for an office visit. Message sent to scheduling.   Called and spoke to pt, transferred him to Ep scheduler to make appointment. Appt made for 6/22, will send in refill.

## 2020-09-08 ENCOUNTER — Other Ambulatory Visit: Payer: Self-pay

## 2020-09-08 ENCOUNTER — Ambulatory Visit: Payer: Medicare Other | Admitting: Student

## 2020-09-08 ENCOUNTER — Encounter: Payer: Self-pay | Admitting: Student

## 2020-09-08 VITALS — BP 132/70 | HR 71 | Ht 72.0 in | Wt 217.8 lb

## 2020-09-08 DIAGNOSIS — I480 Paroxysmal atrial fibrillation: Secondary | ICD-10-CM | POA: Diagnosis not present

## 2020-09-08 DIAGNOSIS — E785 Hyperlipidemia, unspecified: Secondary | ICD-10-CM

## 2020-09-08 DIAGNOSIS — I493 Ventricular premature depolarization: Secondary | ICD-10-CM

## 2020-09-08 NOTE — Progress Notes (Signed)
PCP:  Gaynelle Arabian, MD Primary Cardiologist: None Electrophysiologist: Thompson Grayer, MD   Edward Maynard is a 87 y.o. male seen today for Thompson Grayer, MD for routine electrophysiology followup.  Since last being seen in our clinic the patient reports doing well. Over the past few years he has noticed he is more tired after a round of golf. He is still able to play 18 holes up to 3 times a week per his wife. No overt SOB with his ADLs. He denies  chest pain, palpitations, PND, orthopnea, nausea, vomiting, dizziness, syncope, edema, weight gain, or early satiety.   Device history ILR implanted 2015 by Dr. Rayann Heman. ERI 2018.  Past Medical History:  Diagnosis Date   High cholesterol    05/2013; no defecits   Hyperlipemia    Left arm weakness    Left hand weakness    Personal history of colonic polyp - adenoma  10/23/2013   Stroke ([place]) 05/2013   MRI on 07/02/13 = Multifocal acute & subacute infarction involving right frontal MCA/ACA & left parietal MCA/PCA watershed areas   Past Surgical History:  Procedure Laterality Date   LOOP RECORDER IMPLANT N/A 10/31/2013   Procedure: LOOP RECORDER IMPLANT;  Surgeon: Coralyn Mark, MD;  Location: [place] CATH LAB;  Service: Cardiovascular;  Laterality: N/A;   SHOULDER SURGERY Bilateral 2003, 1995    Current Outpatient Medications  Medication Sig Dispense Refill   ciclopirox (PENLAC) 8 % solution Apply topically at bedtime. Apply over nail and surrounding skin. Apply daily over previous coat. After seven (7) days, may remove with alcohol and continue cycle. 6.6 mL 2   metoprolol succinate (TOPROL-XL) 25 MG 24 hr tablet Take 1 tablet (25 mg total) by mouth daily. Please make overdue appt for future refills. [phone]. 1st attempt. 90 tablet 3   rivaroxaban (XARELTO) 20 MG TABS tablet TAKE 1 TABLET BY MOUTH  DAILY WITH SUPPER 90 tablet 0   simvastatin (ZOCOR) 40 MG tablet Take 1 tablet by mouth  daily at 6 PM. 90 tablet 3   No current  facility-administered medications for this visit.    No Known Allergies  Social History   Socioeconomic History   Marital status: Married    Spouse name: lea   Number of children: 2   Years of education: college   Highest education level: Not on file  Occupational History   Occupation: retired  Tobacco Use   Smoking status: Former    Pack years: 0.00    Types: Cigarettes    Quit date: 03/20/1970    Years since quitting: 50.5   Smokeless tobacco: Never  Vaping Use   Vaping Use: Never used  Substance and Sexual Activity   Alcohol use: No   Drug use: No   Sexual activity: Yes  Other Topics Concern   Not on file  Social History Narrative   Patient right handed   Patient lives with his wife   Patient coffee daily   Enjoys playing golf   Social Determinants of Health   Financial Resource Strain: Not on file  Food Insecurity: Not on file  Transportation Needs: Not on file  Physical Activity: Not on file  Stress: Not on file  Social Connections: Not on file  Intimate Partner Violence: Not on file     Review of Systems: General: No chills, fever, night sweats or weight changes  Cardiovascular:  No chest pain, dyspnea on exertion, edema, orthopnea, palpitations, paroxysmal nocturnal dyspnea Dermatological: No rash, lesions or masses  Respiratory: No cough, dyspnea Urologic: No hematuria, dysuria Abdominal: No nausea, vomiting, diarrhea, bright red blood per rectum, melena, or hematemesis Neurologic: No visual changes, weakness, changes in mental status All other systems reviewed and are otherwise negative except as noted above.  Physical Exam: Vitals:   09/08/20 1242  BP: 132/70  Pulse: 71  SpO2: 96%  Weight: 217 lb 12.8 oz (98.8 kg)  Height: 6' (1.829 m)    GEN- The patient is well appearing, alert and oriented x 3 today.   HEENT: normocephalic, atraumatic; sclera clear, conjunctiva pink; hearing intact; oropharynx clear; neck supple, no JVP Lymph- no  cervical lymphadenopathy Lungs- Clear to ausculation bilaterally, normal work of breathing.  No wheezes, rales, rhonchi Heart- Regular rate and rhythm, no murmurs, rubs or gallops, PMI not laterally displaced GI- soft, non-tender, non-distended, bowel sounds present, no hepatosplenomegaly Extremities- no clubbing, cyanosis, or edema; DP/PT/radial pulses 2+ bilaterally MS- no significant deformity or atrophy Skin- warm and dry, no rash or lesion Psych- euthymic mood, full affect Neuro- strength and sensation are intact  EKG is ordered. Personal review of EKG from today shows NSR at 71 bpm  Additional studies reviewed include: Previous EP office notes  Assessment and Plan:  1. Paroxysmal atrial fibrillation Asymptomatic Prior ILR at [facility] -> decided against extraction Continue Xarelto for CHA2DS2-VASc of at least 4. Labs today.   2. PVCs Has been asymptomatic.  Echo 11/2014 LVEF 55-60%  3. HLD Per PCP on statin.  Labs today. RTC 12 months. Sooner with issues.   Shirley Friar, PA-C  09/08/20 1:39 PM

## 2020-09-08 NOTE — Patient Instructions (Signed)
Medication Instructions:  Your physician recommends that you continue on your current medications as directed. Please refer to the Current Medication list given to you today.  *If you need a refill on your cardiac medications before your next appointment, please call your pharmacy*   Lab Work: CBC and BMET today If you have labs (blood work) drawn today and your tests are completely normal, you will receive your results only by: [place] (if you have MyChart) OR A paper copy in the mail If you have any lab test that is abnormal or we need to change your treatment, we will call you to review the results.   Testing/Procedures: None ordered.    Follow-Up: At [facility], you and your health needs are our priority.  As part of our continuing mission to provide you with exceptional heart care, we have created designated Provider Care Teams.  These Care Teams include your primary Cardiologist (physician) and Advanced Practice Providers (APPs -  Physician Assistants and Nurse Practitioners) who all work together to provide you with the care you need, when you need it.  We recommend signing up for the patient portal called "MyChart".  Sign up information is provided on this After Visit Summary.  MyChart is used to connect with patients for Virtual Visits (Telemedicine).  Patients are able to view lab/test results, encounter notes, upcoming appointments, etc.  Non-urgent messages can be sent to your provider as well.   To learn more about what you can do with MyChart, go to [email or web address].    Your next appointment:   12 month(s)  The format for your next appointment:   In Person  Provider:   Thompson Grayer, MD

## 2020-09-09 LAB — CBC
Hematocrit: 44.6 % (ref 37.5–51.0)
Hemoglobin: 15.4 g/dL (ref 13.0–17.7)
MCH: 32.8 pg (ref 26.6–33.0)
MCHC: 34.5 g/dL (ref 31.5–35.7)
MCV: 95 fL (ref 79–97)
Platelets: 172 10*3/uL (ref 150–450)
RBC: 4.69 x10E6/uL (ref 4.14–5.80)
RDW: 12.3 % (ref 11.6–15.4)
WBC: 6.9 10*3/uL (ref 3.4–10.8)

## 2020-09-09 LAB — BASIC METABOLIC PANEL
BUN/Creatinine Ratio: 15 (ref 10–24)
BUN: 20 mg/dL (ref 8–27)
CO2: 22 mmol/L (ref 20–29)
Calcium: 9.2 mg/dL (ref 8.6–10.2)
Chloride: 103 mmol/L (ref 96–106)
Creatinine, Ser: 1.31 mg/dL — ABNORMAL HIGH (ref 0.76–1.27)
Glucose: 90 mg/dL (ref 65–99)
Potassium: 4.5 mmol/L (ref 3.5–5.2)
Sodium: 140 mmol/L (ref 134–144)
eGFR: 54 mL/min/{1.73_m2} — ABNORMAL LOW (ref >59)

## 2020-10-03 ENCOUNTER — Other Ambulatory Visit: Payer: Self-pay | Admitting: Internal Medicine

## 2020-10-04 NOTE — Telephone Encounter (Signed)
Prescription refill request for Xarelto received.  Indication: afib  Last office visit: [place], 09/08/2020 Weight: 98.8 kg Age: 87 yo  Scr: 1.31, 09/08/2020 CrCl:  59 ml/min   Pt is on the correct dose of Xarelto per dosing criteria, prescription refill sent for Xarelto 20mg  daily.

## 2020-12-09 DIAGNOSIS — Z79899 Other long term (current) drug therapy: Secondary | ICD-10-CM | POA: Diagnosis not present

## 2020-12-09 DIAGNOSIS — R5383 Other fatigue: Secondary | ICD-10-CM | POA: Diagnosis not present

## 2020-12-09 DIAGNOSIS — N1831 Chronic kidney disease, stage 3a: Secondary | ICD-10-CM | POA: Diagnosis not present

## 2020-12-09 DIAGNOSIS — I480 Paroxysmal atrial fibrillation: Secondary | ICD-10-CM | POA: Diagnosis not present

## 2020-12-09 DIAGNOSIS — Z23 Encounter for immunization: Secondary | ICD-10-CM | POA: Diagnosis not present

## 2020-12-09 DIAGNOSIS — D6869 Other thrombophilia: Secondary | ICD-10-CM | POA: Diagnosis not present

## 2020-12-09 DIAGNOSIS — Z0000 Encounter for general adult medical examination without abnormal findings: Secondary | ICD-10-CM | POA: Diagnosis not present

## 2020-12-09 DIAGNOSIS — Z1389 Encounter for screening for other disorder: Secondary | ICD-10-CM | POA: Diagnosis not present

## 2020-12-09 DIAGNOSIS — D692 Other nonthrombocytopenic purpura: Secondary | ICD-10-CM | POA: Diagnosis not present

## 2020-12-09 DIAGNOSIS — E7800 Pure hypercholesterolemia, unspecified: Secondary | ICD-10-CM | POA: Diagnosis not present

## 2020-12-22 DIAGNOSIS — H34811 Central retinal vein occlusion, right eye, with macular edema: Secondary | ICD-10-CM | POA: Diagnosis not present

## 2021-02-05 DIAGNOSIS — H6121 Impacted cerumen, right ear: Secondary | ICD-10-CM | POA: Diagnosis not present

## 2021-03-16 ENCOUNTER — Other Ambulatory Visit: Payer: Self-pay | Admitting: Internal Medicine

## 2021-03-16 NOTE — Telephone Encounter (Signed)
Xarelto 20mg  refill request received. Pt is 87 years old, weight-98.8kg, Crea-1.31 on 09/08/2020, last seen by Oda Kilts on 09/08/2020, Diagnosis-Afib, CrCl-57.69ml/min; Dose is appropriate based on dosing criteria. Will send in refill to requested pharmacy.

## 2021-04-26 DIAGNOSIS — M25561 Pain in right knee: Secondary | ICD-10-CM | POA: Diagnosis not present

## 2021-04-26 DIAGNOSIS — M1711 Unilateral primary osteoarthritis, right knee: Secondary | ICD-10-CM | POA: Diagnosis not present

## 2021-05-04 DIAGNOSIS — H34811 Central retinal vein occlusion, right eye, with macular edema: Secondary | ICD-10-CM | POA: Diagnosis not present

## 2021-06-13 DIAGNOSIS — H6123 Impacted cerumen, bilateral: Secondary | ICD-10-CM | POA: Diagnosis not present

## 2021-08-03 DIAGNOSIS — M79604 Pain in right leg: Secondary | ICD-10-CM | POA: Diagnosis not present

## 2021-08-03 DIAGNOSIS — D6869 Other thrombophilia: Secondary | ICD-10-CM | POA: Diagnosis not present

## 2021-08-03 DIAGNOSIS — I480 Paroxysmal atrial fibrillation: Secondary | ICD-10-CM | POA: Diagnosis not present

## 2021-08-03 DIAGNOSIS — L821 Other seborrheic keratosis: Secondary | ICD-10-CM | POA: Diagnosis not present

## 2021-08-03 DIAGNOSIS — N183 Chronic kidney disease, stage 3 unspecified: Secondary | ICD-10-CM | POA: Diagnosis not present

## 2021-08-12 ENCOUNTER — Other Ambulatory Visit: Payer: Self-pay | Admitting: Internal Medicine

## 2021-08-16 NOTE — Telephone Encounter (Signed)
Prescription refill request for Xarelto received.  Indication: afib  Last office visit: [place], 09/08/2020 Weight: 98.8 kg  Age: 88 yo  Scr: 1.31, 09/08/2020 CrCl: 57 ml/min     Refill sent.

## 2021-09-12 ENCOUNTER — Ambulatory Visit (HOSPITAL_BASED_OUTPATIENT_CLINIC_OR_DEPARTMENT_OTHER): Payer: Medicare Other | Admitting: Internal Medicine

## 2021-09-12 ENCOUNTER — Encounter (HOSPITAL_BASED_OUTPATIENT_CLINIC_OR_DEPARTMENT_OTHER): Payer: Self-pay | Admitting: Internal Medicine

## 2021-09-12 VITALS — BP 100/70 | HR 67 | Ht 72.0 in | Wt 220.2 lb

## 2021-09-12 DIAGNOSIS — I4729 Other ventricular tachycardia: Secondary | ICD-10-CM | POA: Diagnosis not present

## 2021-09-12 DIAGNOSIS — I493 Ventricular premature depolarization: Secondary | ICD-10-CM | POA: Diagnosis not present

## 2021-09-12 DIAGNOSIS — I480 Paroxysmal atrial fibrillation: Secondary | ICD-10-CM

## 2021-09-12 MED ORDER — METOPROLOL SUCCINATE ER 25 MG PO TB24: 12.5000 mg | ORAL_TABLET | Freq: Every day | ORAL | 3 refills | Status: DC

## 2021-09-13 LAB — BASIC METABOLIC PANEL
BUN/Creatinine Ratio: 18 (ref 10–24)
BUN: 20 mg/dL (ref 8–27)
CO2: 24 mmol/L (ref 20–29)
Calcium: 9.1 mg/dL (ref 8.6–10.2)
Chloride: 103 mmol/L (ref 96–106)
Creatinine, Ser: 1.14 mg/dL (ref 0.76–1.27)
Glucose: 104 mg/dL — ABNORMAL HIGH (ref 70–99)
Potassium: 4 mmol/L (ref 3.5–5.2)
Sodium: 142 mmol/L (ref 134–144)
eGFR: 63 mL/min/{1.73_m2} (ref >59)

## 2021-09-13 LAB — CBC
Hematocrit: 45 % (ref 37.5–51.0)
Hemoglobin: 15.4 g/dL (ref 13.0–17.7)
MCH: 33.3 pg — ABNORMAL HIGH (ref 26.6–33.0)
MCHC: 34.2 g/dL (ref 31.5–35.7)
MCV: 97 fL (ref 79–97)
Platelets: 160 10*3/uL (ref 150–450)
RBC: 4.62 x10E6/uL (ref 4.14–5.80)
RDW: 12.8 % (ref 11.6–15.4)
WBC: 6.1 10*3/uL (ref 3.4–10.8)

## 2021-09-21 DIAGNOSIS — H34811 Central retinal vein occlusion, right eye, with macular edema: Secondary | ICD-10-CM | POA: Diagnosis not present

## 2021-11-01 ENCOUNTER — Other Ambulatory Visit: Payer: Self-pay | Admitting: Internal Medicine

## 2021-11-01 NOTE — Telephone Encounter (Signed)
Prescription refill request for Xarelto received.  Indication:Afib Last office visit:6/23 Weight:99.9 kg Age:88 Scr:1.1 CrCl:69.38 ml/min  Prescription refilled

## 2021-11-15 DIAGNOSIS — H6123 Impacted cerumen, bilateral: Secondary | ICD-10-CM | POA: Diagnosis not present

## 2021-12-15 DIAGNOSIS — D6869 Other thrombophilia: Secondary | ICD-10-CM | POA: Diagnosis not present

## 2021-12-15 DIAGNOSIS — Z0000 Encounter for general adult medical examination without abnormal findings: Secondary | ICD-10-CM | POA: Diagnosis not present

## 2021-12-15 DIAGNOSIS — Z8673 Personal history of transient ischemic attack (TIA), and cerebral infarction without residual deficits: Secondary | ICD-10-CM | POA: Diagnosis not present

## 2021-12-15 DIAGNOSIS — R5383 Other fatigue: Secondary | ICD-10-CM | POA: Diagnosis not present

## 2021-12-15 DIAGNOSIS — Z79899 Other long term (current) drug therapy: Secondary | ICD-10-CM | POA: Diagnosis not present

## 2021-12-15 DIAGNOSIS — Z23 Encounter for immunization: Secondary | ICD-10-CM | POA: Diagnosis not present

## 2021-12-15 DIAGNOSIS — I480 Paroxysmal atrial fibrillation: Secondary | ICD-10-CM | POA: Diagnosis not present

## 2021-12-15 DIAGNOSIS — E7800 Pure hypercholesterolemia, unspecified: Secondary | ICD-10-CM | POA: Diagnosis not present

## 2021-12-19 ENCOUNTER — Telehealth: Payer: Self-pay | Admitting: Student

## 2021-12-19 DIAGNOSIS — R55 Syncope and collapse: Secondary | ICD-10-CM | POA: Diagnosis not present

## 2021-12-19 NOTE — Telephone Encounter (Signed)
Left a message for the pt/ wife to call back.  

## 2021-12-19 NOTE — Telephone Encounter (Signed)
Pt fell and injured his hand, arm and side and wants to what do.

## 2021-12-21 NOTE — Telephone Encounter (Signed)
Pt returning nurses call from 10/2. Please advise

## 2021-12-21 NOTE — Telephone Encounter (Signed)
I spoke with patient. He was golfing earlier this week and quit after 9 holes because he was very tired.  He drove home and when he got out of the car he fell to the ground.  Feels that he passed out.  Came around quickly.  Neighbor saw him fall and said he got up quickly. He usually takes 2-3 bottles of water with him but had only drank one bottle that morning.  Reports he has had similar episodes of feeling like he will pass out while golfing.  He did not hit his head.  Saw Dr Alroy Dust since this episode and everything checked out. Patient requests appointment with cardiology.  Appointment made for patient to see Ambrose Pancoast, NP tomorrow at 8 AM. Patient encouraged to stay hydrated and change positions slowly.

## 2021-12-21 NOTE — Progress Notes (Unsigned)
Office Visit    Patient Name: Edward Maynard Date of Encounter: 12/22/2021  Primary Care Provider:  Gaynelle Arabian, MD Primary Cardiologist:  Melida Quitter, MD Primary Electrophysiologist: Thompson Grayer, MD  Chief Complaint    Edward Maynard is a 88 y.o. male with PMH of HLD, cryptogenic CVA (plantable loop recorder), atrial fibrillation (on Xarelto), PVCs, SVT who presents today for complaint of syncope and presyncope.  Past Medical History    Past Medical History:  Diagnosis Date   High cholesterol    05/2013; no defecits   Hyperlipemia    Left arm weakness    Left hand weakness    Personal history of colonic polyp - adenoma  10/23/2013   Stroke ([place]) 05/2013   MRI on 07/02/13 = Multifocal acute & subacute infarction involving right frontal MCA/ACA & left parietal MCA/PCA watershed areas   Past Surgical History:  Procedure Laterality Date   LOOP RECORDER IMPLANT N/A 10/31/2013   Procedure: LOOP RECORDER IMPLANT;  Surgeon: Coralyn Mark, MD;  Location: [facility];  Service: Cardiovascular;  Laterality: N/A;   SHOULDER SURGERY Bilateral 2003, 1995    Allergies  No Known Allergies  History of Present Illness    Edward Maynard  is a 88 year old male with the above mention past medical history who presents today for complaint of syncope and presyncope.  Edward Maynard was recently seen by Dr. Rayann Heman in 2015 for cryptogenic stroke.  Patient was found to have possible embolic stroke and MRI completed 06/2013 revealing multifocal acute and subacute infarcts in the right frontal MCA/ACA and left parietal MCA/PCA.  Patient had implantable loop recorder placed on 10/31/2013 that was successful.  Loop recorder revealed atrial fibrillation and patient was started on Xarelto.  Subsequent interrogations revealed ventricular ectopy and NSVT which patient was asymptomatic for.  Patient was started on Toprol-XL for rate control. Echo completed 11/2014 demonstrated EF 97-67%, grade 1 diastolic  dysfunction, LA 47.  Patient elected to leave loop recorder in place instead of explanting.  He was last seen by Dr. Rayann Heman for routine EP follow-up on 09/12/2021.  During visit patient was doing well with no complaints of palpitations, chest pain, shortness of breath.  Patient had some complaints of fatigue and being tired in the afternoons.  His Toprol-XL was reduced to 12.5 mg daily.  Patient was advised to use support hose and was offered referral to [facility] for right leg discomfort however he declined at that time.  Edward Maynard presents today for complaint of presyncope and dizziness.  Since last being seen in the office patient reports that he had an episode of presyncope and fell in his driveway following 9 holes of golf last week.  He reports that he did not strike his head and never lost consciousness.  During the day patient had eaten breakfast but only drank 1 bottle of water during his golfing outing.  He endorses extreme fatigue and swimmy headedness when standing.  He was seen by Dr. Rayann Heman in June with similar complaint and was told to reduce Toprol to 12.5 mg however patient increased back to 25 mg due to not feeling any difference in affect.  Patient also endorses tingling in his lower extremities when sitting for prolonged times.  Patient had went to PCP following incident and all lab work was normal with no indications of dehydration.  Patient reported sweating that morning during his golfing event with mild temperatures of 74 degrees that day.  Patient  denies chest pain, palpitations, dyspnea, PND, orthopnea, nausea, vomiting, dizziness, syncope, edema, weight gain, or early satiety.   Home Medications    Current Outpatient Medications  Medication Sig Dispense Refill   metoprolol succinate (TOPROL XL) 25 MG 24 hr tablet Take 0.5 tablets (12.5 mg total) by mouth daily. 45 tablet 3   rivaroxaban (XARELTO) 20 MG TABS tablet TAKE 1 TABLET BY MOUTH DAILY  WITH SUPPER 100 tablet 3   simvastatin  (ZOCOR) 40 MG tablet Take 1 tablet by mouth  daily at 6 PM. 90 tablet 3   No current facility-administered medications for this visit.     Review of Systems  Please see the history of present illness.    (+) Dizziness (+) Fatigue  All other systems reviewed and are otherwise negative except as noted above.  Physical Exam    Wt Readings from Last 3 Encounters:  12/22/21 217 lb 6.4 oz (98.6 kg)  09/12/21 220 lb 3.2 oz (99.9 kg)  09/08/20 217 lb 12.8 oz (98.8 kg)   VS: Vitals:   12/22/21 0745 12/22/21 0833  BP: (!) 140/80 118/78  Pulse: 74   SpO2: 96%   ,Body mass index is 29.69 kg/m.  Constitutional:      Appearance: Healthy appearance. Not in distress.  Neck:     Vascular: JVD normal.  Pulmonary:     Effort: Pulmonary effort is normal.     Breath sounds: No wheezing. No rales. Diminished in the bases Cardiovascular:     Normal rate. Regular rhythm. Normal S1. Normal S2.      Murmurs: There is no murmur.  Edema:    Peripheral edema absent.  Abdominal:     Palpations: Abdomen is soft non tender. There is no hepatomegaly.  Skin:    General: Skin is warm and dry.  Neurological:     General: No focal deficit present.     Mental Status: Alert and oriented to person, place and time.     Cranial Nerves: Cranial nerves are intact.  EKG/LABS/Other Studies Reviewed    ECG personally reviewed by me today -none completed today    Orthostatic VS for the past 24 hrs (Last 3 readings):  BP- Lying Pulse- Lying BP- Sitting Pulse- Sitting BP- Standing at 0 minutes Pulse- Standing at 0 minutes BP- Standing at 3 minutes  12/22/21 0750 143/78 69 131/71 71 116/78 76 133/71     Lab Results  Component Value Date   WBC 6.1 09/12/2021   HGB 15.4 09/12/2021   HCT 45.0 09/12/2021   MCV 97 09/12/2021   PLT 160 09/12/2021   Lab Results  Component Value Date   CREATININE 1.14 09/12/2021   BUN 20 09/12/2021   NA 142 09/12/2021   K 4.0 09/12/2021   CL 103 09/12/2021   CO2 24  09/12/2021   Lab Results  Component Value Date   ALT 23 10/01/2013   AST 20 10/01/2013   ALKPHOS 99 10/01/2013   BILITOT 0.5 10/01/2013   Lab Results  Component Value Date   CHOL 190 07/05/2013   HDL 36 (L) 07/05/2013   LDLCALC 115 (H) 07/05/2013   TRIG 194 (H) 07/05/2013   CHOLHDL 5.3 07/05/2013    Lab Results  Component Value Date   HGBA1C 5.8 (H) 07/05/2013    Assessment & Plan    1.  History of CVA: -Right MCA stroke in 06/2013 that occurred due to atrial fibrillation revealed by implantable loop recorder -Today patient reports no dizziness or unilateral weakness -  He is compliant with Xarelto 20 mg daily and denies any missed doses   2.  Hyperlipidemia: -Patient's last LDL cholesterol was 115 -Continue Zocor 40 mg daily  3.  Paroxysmal atrial fibrillation: -Patient currently sinus rhythm by auscultation -Patient had prior ILR placed 2015 that is still implanted with patient electing not to extract at this time. -Continue Xarelto 20 mg daily -Previous labs with CBC and hemoglobin of 15.4 and creatinine of 1.31 -Patient is interested in having loop recorder explanted and will discuss further at upcoming visit.  5.  Syncope: -Patient reports episode of syncope that occurred after playing golf. -Patient did not strike his head and had labs checked by PCP that were all normal. -Patient does endorse increased fatigue and states that his current Toprol-XL has not been reduced per Dr. Rayann Heman -Patient advised to reduce Toprol-XL to 12.5 mg daily -We will order ZIO AT to rule out any atrial or ventricular arrhythmia contributing to presyncope -Patient encouraged to wear compression stockings while ambulating and elevate legs when dependent -Patient encouraged to increase hydration especially with outdoor activities and increase ambulation  6.  Venous insufficiency: -Patient complains of tingling in lower extremities with discomfort when standing -We will send for ABIs and  lower extremity Dopplers to rule out any claudication or decreased circulatory function -Patient encouraged to wear compression stockings and increase hydration as noted above   Disposition: Follow-up with Melida Quitter, MD or APP in 1 months   Medication Adjustments/Labs and Tests Ordered: Current medicines are reviewed at length with the patient today.  Concerns regarding medicines are outlined above.   Signed, Mable Fill, Marissa Nestle, NP 12/22/2021, 8:40 AM [facility]  Note:  This document was prepared using Dragon voice recognition software and may include unintentional dictation errors.

## 2021-12-22 ENCOUNTER — Encounter: Payer: Self-pay | Admitting: Nurse Practitioner

## 2021-12-22 ENCOUNTER — Ambulatory Visit: Payer: Medicare Other | Attending: Nurse Practitioner | Admitting: Nurse Practitioner

## 2021-12-22 ENCOUNTER — Other Ambulatory Visit (INDEPENDENT_AMBULATORY_CARE_PROVIDER_SITE_OTHER): Payer: Medicare Other

## 2021-12-22 VITALS — BP 118/78 | HR 74 | Ht 71.75 in | Wt 217.4 lb

## 2021-12-22 DIAGNOSIS — E785 Hyperlipidemia, unspecified: Secondary | ICD-10-CM

## 2021-12-22 DIAGNOSIS — R55 Syncope and collapse: Secondary | ICD-10-CM

## 2021-12-22 DIAGNOSIS — I872 Venous insufficiency (chronic) (peripheral): Secondary | ICD-10-CM

## 2021-12-22 DIAGNOSIS — I6340 Cerebral infarction due to embolism of unspecified cerebral artery: Secondary | ICD-10-CM | POA: Diagnosis not present

## 2021-12-22 DIAGNOSIS — I480 Paroxysmal atrial fibrillation: Secondary | ICD-10-CM | POA: Diagnosis not present

## 2021-12-22 NOTE — Patient Instructions (Addendum)
Medication Instructions:  Your physician has recommended you make the following change in your medication:   REDUCE the Toprol back to a 1/2 tablet   *If you need a refill on your cardiac medications before your next appointment, please call your pharmacy*   Lab Work: None ordered  If you have labs (blood work) drawn today and your tests are completely normal, you will receive your results only by: [place] (if you have MyChart) OR A paper copy in the mail If you have any lab test that is abnormal or we need to change your treatment, we will call you to review the results.   Testing/Procedures: Your physician has requested that you have a lower extremity arterial exercise duplex with ABI's. During this test, exercise and ultrasound are used to evaluate arterial blood flow in the legs. Allow one hour for this exam. There are no restrictions or special instructions.    Follow-Up: At [facility], you and your health needs are our priority.  As part of our continuing mission to provide you with exceptional heart care, we have created designated Provider Care Teams.  These Care Teams include your primary Cardiologist (physician) and Advanced Practice Providers (APPs -  Physician Assistants and Nurse Practitioners) who all work together to provide you with the care you need, when you need it.  We recommend signing up for the patient portal called "MyChart".  Sign up information is provided on this After Visit Summary.  MyChart is used to connect with patients for Virtual Visits (Telemedicine).  Patients are able to view lab/test results, encounter notes, upcoming appointments, etc.  Non-urgent messages can be sent to your provider as well.   To learn more about what you can do with MyChart, go to [email or web address].    Your next appointment:   1 month(s)  The format for your next appointment:   In Person  Provider:   Melida Quitter, MD  or Ambrose Pancoast, NP          Other Instructions Get some compression stockings to wear Stay hydrated   Important Information About Sugar      ZIO AT Long term monitor-Live Telemetry  Your physician has requested you wear a ZIO patch monitor for 14 days.  This is a single patch monitor. [employer] supplies one patch monitor per enrollment. Additional  stickers are not available.  Please do not apply patch if you will be having a Nuclear Stress Test, Echocardiogram, Cardiac CT, MRI,  or Chest Xray during the period you would be wearing the monitor. The patch cannot be worn during  these tests. You cannot remove and re-apply the ZIO AT patch monitor.  Your ZIO patch monitor will be mailed 3 day USPS to your address on file. It may take 3-5 days to  receive your monitor after you have been enrolled.  Once you have received your monitor, please review the enclosed instructions. Your monitor has  already been registered assigning a specific monitor serial # to you.   Billing and Patient Assistance Program information  Theodore Demark has been supplied with any insurance information on record for billing. [employer] offers a sliding scale Patient Assistance Program for patients without insurance, or whose  insurance does not completely cover the cost of the ZIO patch monitor. You must apply for the  Patient Assistance Program to qualify for the discounted rate. To apply, call [employer] at [phone],  select option 4, select option 2 , ask to apply for the Patient Assistance  Program, (you can request an  interpreter if needed). [employer] will ask your household income and how many people are in your  household. [employer] will quote your out-of-pocket cost based on this information. They will also be able  to set up a 12 month interest free payment plan if needed.  Applying the monitor   Shave hair from upper left chest.  Hold the abrader disc by orange tab. Rub the abrader in 40 strokes over left upper chest as indicated in   your monitor instructions.  Clean area with 4 enclosed alcohol pads. Use all pads to ensure the area is cleaned thoroughly. Let  dry.  Apply patch as indicated in monitor instructions. Patch will be placed under collarbone on left side of  chest with arrow pointing upward.  Rub patch adhesive wings for 2 minutes. Remove the white label marked "1". Remove the white label  marked "2". Rub patch adhesive wings for 2 additional minutes.  While looking in a mirror, press and release button in center of patch. A small green light will flash 3-4  times. This will be your only indicator that the monitor has been turned on.  Do not shower for the first 24 hours. You may shower after the first 24 hours.  Press the button if you feel a symptom. You will hear a small click. Record Date, Time and Symptom in  the Patient Log.   Starting the Gateway  In your kit there is a Hydrographic surveyor box the size of a cellphone. This is Airline pilot. It transmits all your  recorded data to [facility]. This box must always stay within 10 feet of you. Open the box and push the *  button. There will be a light that blinks orange and then green a few times. When the light stops  blinking, the Gateway is connected to the ZIO patch. Call [employer] at [phone] to confirm your monitor is transmitting.  Returning your monitor  Remove your patch and place it inside the [place]. In the lower half of the Gateway there is a white  bag with prepaid postage on it. Place Gateway in bag and seal. Mail package back to [place] as soon as  possible. Your physician should have your final report approximately 7 days after you have mailed back  your monitor. Call [place] at [phone] if you have questions regarding your ZIO AT  patch monitor. Call them immediately if you see an orange light blinking on your monitor.  If your monitor falls off in less than 4 days, contact our Monitor department at  [phone]. If your  monitor becomes loose or falls off after 4 days call [employer] at [phone] for suggestions on  securing your monitor

## 2021-12-22 NOTE — Progress Notes (Unsigned)
Enrolled for [employer] to mail a ZIO AT Live Telemetry monitor to patients address on file.   Dr. Doralee Albino to read.

## 2021-12-25 DIAGNOSIS — R55 Syncope and collapse: Secondary | ICD-10-CM

## 2021-12-25 DIAGNOSIS — I872 Venous insufficiency (chronic) (peripheral): Secondary | ICD-10-CM | POA: Diagnosis not present

## 2021-12-26 DIAGNOSIS — R55 Syncope and collapse: Secondary | ICD-10-CM | POA: Diagnosis not present

## 2021-12-26 DIAGNOSIS — I872 Venous insufficiency (chronic) (peripheral): Secondary | ICD-10-CM | POA: Diagnosis not present

## 2022-01-12 ENCOUNTER — Ambulatory Visit (HOSPITAL_COMMUNITY)
Admission: RE | Admit: 2022-01-12 | Discharge: 2022-01-12 | Disposition: A | Discharge status: Home / Self Care | Payer: Medicare Other | Source: Ambulatory Visit | Attending: Nurse Practitioner | Admitting: Nurse Practitioner

## 2022-01-12 DIAGNOSIS — R55 Syncope and collapse: Secondary | ICD-10-CM | POA: Diagnosis not present

## 2022-01-12 DIAGNOSIS — I872 Venous insufficiency (chronic) (peripheral): Secondary | ICD-10-CM | POA: Insufficient documentation

## 2022-01-23 ENCOUNTER — Ambulatory Visit: Payer: Medicare Other | Attending: Cardiovascular Disease | Admitting: Cardiovascular Disease

## 2022-01-23 ENCOUNTER — Encounter: Payer: Self-pay | Admitting: Cardiovascular Disease

## 2022-01-23 VITALS — BP 118/62 | HR 69 | Ht 71.75 in | Wt 218.0 lb

## 2022-01-23 DIAGNOSIS — I480 Paroxysmal atrial fibrillation: Secondary | ICD-10-CM

## 2022-01-23 DIAGNOSIS — I493 Ventricular premature depolarization: Secondary | ICD-10-CM

## 2022-01-23 DIAGNOSIS — I4729 Other ventricular tachycardia: Secondary | ICD-10-CM | POA: Diagnosis not present

## 2022-01-23 NOTE — Patient Instructions (Signed)
Medication Instructions:  Your physician recommends that you continue on your current medications as directed. Please refer to the Current Medication list given to you today.  *If you need a refill on your cardiac medications before your next appointment, please call your pharmacy*   Follow-Up: At [facility], you and your health needs are our priority.  As part of our continuing mission to provide you with exceptional heart care, we have created designated Provider Care Teams.  These Care Teams include your primary Cardiologist (physician) and Advanced Practice Providers (APPs -  Physician Assistants and Nurse Practitioners) who all work together to provide you with the care you need, when you need it.  Your next appointment:   6 month(s)  The format for your next appointment:   In Person  Provider:   You may see Doralee Albino, MD or one of the following Advanced Practice Providers on your designated Care Team:   Tommye Standard, [place] Legrand Como "Jonni Sanger" Chalmers Cater, [place]     Important Information About Sugar

## 2022-01-23 NOTE — Progress Notes (Signed)
Electrophysiology Office Note:    Date:  01/23/2022   ID:  Edward Maynard, DOB Aug 02, 1933, MRN [id number]  PCP:  Gaynelle Arabian, [place] Cardiologist:  Melida Quitter, MD Electrophysiologist:  Thompson Grayer, MD     Referring MD: Gaynelle Arabian, MD   Chief complaint: follow-up  History of Present Illness:    Edward Maynard is a 88 y.o. male with a hx of PAF, PVCs and NSVT here for EP follow-up.  He is seen as an expedited visit today due to a recent fall after playing golf.  He reports that he has been sedentary for the prior 3 days while watching the [employer].  He played golf on a day when the temperature was in the 70s.  He does note that he was sweating somewhat, but he did not feel very well, so he played only 9 holes.  He felt dehydrated since he had not been consuming much water around that time.  He drove home, and then most immediately after getting out of his car, he felt woozy and then woke up on the ground.  A neighbor witnessed his fall and reported that he got up very soon after he fell.  He did not injure himself, did not hit his head.  He has not had any palpitations, chest pain, shortness of breath.  He does note fatigue, and tasks require little more exertion than they used to.     Past Medical History:  Diagnosis Date   High cholesterol    05/2013; no defecits   Hyperlipemia    Left arm weakness    Left hand weakness    Personal history of colonic polyp - adenoma  10/23/2013   Stroke ([place]) 05/2013   MRI on 07/02/13 = Multifocal acute & subacute infarction involving right frontal MCA/ACA & left parietal MCA/PCA watershed areas    Past Surgical History:  Procedure Laterality Date   LOOP RECORDER IMPLANT N/A 10/31/2013   Procedure: LOOP RECORDER IMPLANT;  Surgeon: Coralyn Mark, MD;  Location: [facility] CATH [facility];  Service: Cardiovascular;  Laterality: N/A;   SHOULDER SURGERY Bilateral 2003, 1995    Current Medications: Current Meds   Medication Sig   metoprolol succinate (TOPROL XL) 25 MG 24 hr tablet Take 0.5 tablets (12.5 mg total) by mouth daily.   rivaroxaban (XARELTO) 20 MG TABS tablet TAKE 1 TABLET BY MOUTH DAILY  WITH SUPPER   simvastatin (ZOCOR) 40 MG tablet Take 1 tablet by mouth  daily at 6 PM.     Allergies:   Patient has no known allergies.   Social History   Socioeconomic History   Marital status: Married    Spouse name: lea   Number of children: 2   Years of education: college   Highest education level: Not on file  Occupational History   Occupation: retired  Tobacco Use   Smoking status: Former    Types: Cigarettes    Quit date: 03/20/1970    Years since quitting: 51.8   Smokeless tobacco: Never  Vaping Use   Vaping Use: Never used  Substance and Sexual Activity   Alcohol use: No   Drug use: No   Sexual activity: Yes  Other Topics Concern   Not on file  Social History Narrative   Patient right handed   Patient lives with his wife   Patient coffee daily   Enjoys playing golf   Social Determinants of Health   Financial Resource Strain: Not  on file  Food Insecurity: Not on file  Transportation Needs: Not on file  Physical Activity: Not on file  Stress: Not on file  Social Connections: Not on file     Family History: The patient's family history includes COPD in his brother; Hypertension in his father and mother; Stroke in his brother and father. There is no history of Colon cancer or Heart attack.  ROS:   Please see the history of present illness.    All other systems reviewed and are negative.  EKGs/Labs/Other Studies Reviewed:     ZioPatch 14 day 12/2021 Patient had a min HR of 53 bpm, max HR of 231 bpm, and avg HR of 74 bpm. Predominant underlying rhythm was Sinus Rhythm. First Degree AV Block was present. 3 Ventricular Tachycardia runs occurred, the run with the fastest interval lasting 4 beats with a  max rate of 231 bpm, the longest lasting 11 beats with an avg rate of  170 bpm. 138 Supraventricular Tachycardia runs occurred, the run with the fastest interval lasting 32.4 secs with a max rate of 194 bpm, the longest lasting 4 mins 37 secs with an avg  rate of 165 bpm. Some episodes of Supraventricular Tachycardia may be possible Atrial Tachycardia with variable block. Isolated SVEs were occasional (2.6%, [id number]), SVE Couplets were rare (<1.0%, 1464), and SVE Triplets were rare (<1.0%, 394). Isolated  VEs were occasional (1.9%, [id number]), VE Couplets were rare (<1.0%, 672), and VE Triplets were rare (<1.0%, 17). Ventricular Bigeminy and Trigeminy were present.  EKG:  Last EKG results: today - Sinus rhythm   Recent Labs: 09/12/2021: BUN 20; Creatinine, Ser 1.14; Hemoglobin 15.4; Platelets 160; Potassium 4.0; Sodium 142    Physical Exam:    VS:  BP 118/62   Pulse 69   Ht 5' 11.75" (1.822 m)   Wt 218 lb (98.9 kg)   SpO2 92%   BMI 29.77 kg/m     Wt Readings from Last 3 Encounters:  01/23/22 218 lb (98.9 kg)  12/22/21 217 lb 6.4 oz (98.6 kg)  09/12/21 220 lb 3.2 oz (99.9 kg)     GEN: Well nourished, well developed in no acute distress CARDIAC: RRR, no murmurs, rubs, gallops RESPIRATORY:  Normal work of breathing MUSCULOSKELETAL: no edema    ASSESSMENT & PLAN:    Syncope:  highly suspicious for orthostatic etiology. He has been doing much better since increasing water intake.  I cannot exclude arrhythmia as a contributing factor and suggested placement of another loop monitor for reassurance, but he is not very keen on this. I am not going to make any changes today. Atrial fibrillation: no AF events on recent monitor. Continue xarelto for secondary hypercoagulable state. SVT: likely AT; episodes are brief. Appears asymptomatic PVCs: burden < 2% on recent monitor         Medication Adjustments/Labs and Tests Ordered: Current medicines are reviewed at length with the patient today.  Concerns regarding medicines are outlined above.  Orders Placed  This Encounter  Procedures   EKG 12-Lead   No orders of the defined types were placed in this encounter.    Signed, Melida Quitter, MD  01/23/2022 10:09 AM    [facility]

## 2022-02-23 DIAGNOSIS — M1711 Unilateral primary osteoarthritis, right knee: Secondary | ICD-10-CM | POA: Diagnosis not present

## 2022-03-01 DIAGNOSIS — H26492 Other secondary cataract, left eye: Secondary | ICD-10-CM | POA: Diagnosis not present

## 2022-03-01 DIAGNOSIS — H34811 Central retinal vein occlusion, right eye, with macular edema: Secondary | ICD-10-CM | POA: Diagnosis not present

## 2022-05-09 DIAGNOSIS — R6889 Other general symptoms and signs: Secondary | ICD-10-CM | POA: Diagnosis not present

## 2022-05-09 DIAGNOSIS — Z743 Need for continuous supervision: Secondary | ICD-10-CM | POA: Diagnosis not present

## 2022-05-09 DIAGNOSIS — I499 Cardiac arrhythmia, unspecified: Secondary | ICD-10-CM | POA: Diagnosis not present

## 2022-05-09 DIAGNOSIS — R0902 Hypoxemia: Secondary | ICD-10-CM | POA: Diagnosis not present

## 2022-05-09 DIAGNOSIS — W19XXXA Unspecified fall, initial encounter: Secondary | ICD-10-CM | POA: Diagnosis not present

## 2022-05-09 NOTE — Addendum Note (Signed)
Encounter addended by: Laren Everts, RPH on: 05/09/2022 11:57 PM  Actions taken: Order Reconciliation Section accessed

## 2022-05-10 ENCOUNTER — Inpatient Hospital Stay (HOSPITAL_COMMUNITY)
Admission: EM | Admit: 2022-05-10 | Discharge: 2022-05-18 | DRG: 024 | Disposition: A | Discharge status: Rehabilitation Facility | Payer: Medicare Other | Attending: Neurology | Admitting: Neurology

## 2022-05-10 ENCOUNTER — Emergency Department (HOSPITAL_COMMUNITY): Payer: Medicare Other | Admitting: Anesthesiology

## 2022-05-10 ENCOUNTER — Encounter (HOSPITAL_COMMUNITY): Payer: Self-pay

## 2022-05-10 ENCOUNTER — Inpatient Hospital Stay (HOSPITAL_COMMUNITY): Payer: Medicare Other

## 2022-05-10 ENCOUNTER — Emergency Department (HOSPITAL_COMMUNITY): Payer: Medicare Other

## 2022-05-10 ENCOUNTER — Encounter (HOSPITAL_COMMUNITY)
Admission: EM | Disposition: A | Discharge status: Rehabilitation Facility | Payer: Self-pay | Source: Home / Self Care | Attending: Neurology

## 2022-05-10 DIAGNOSIS — E7800 Pure hypercholesterolemia, unspecified: Secondary | ICD-10-CM | POA: Diagnosis not present

## 2022-05-10 DIAGNOSIS — I69354 Hemiplegia and hemiparesis following cerebral infarction affecting left non-dominant side: Secondary | ICD-10-CM | POA: Diagnosis not present

## 2022-05-10 DIAGNOSIS — R000 Tachycardia, unspecified: Secondary | ICD-10-CM | POA: Diagnosis not present

## 2022-05-10 DIAGNOSIS — R29709 NIHSS score 9: Secondary | ICD-10-CM | POA: Diagnosis present

## 2022-05-10 DIAGNOSIS — I6523 Occlusion and stenosis of bilateral carotid arteries: Secondary | ICD-10-CM | POA: Diagnosis not present

## 2022-05-10 DIAGNOSIS — H518 Other specified disorders of binocular movement: Secondary | ICD-10-CM | POA: Diagnosis not present

## 2022-05-10 DIAGNOSIS — R29711 NIHSS score 11: Secondary | ICD-10-CM | POA: Diagnosis not present

## 2022-05-10 DIAGNOSIS — I6389 Other cerebral infarction: Secondary | ICD-10-CM

## 2022-05-10 DIAGNOSIS — H53462 Homonymous bilateral field defects, left side: Secondary | ICD-10-CM | POA: Diagnosis not present

## 2022-05-10 DIAGNOSIS — R1310 Dysphagia, unspecified: Secondary | ICD-10-CM

## 2022-05-10 DIAGNOSIS — E785 Hyperlipidemia, unspecified: Secondary | ICD-10-CM | POA: Diagnosis present

## 2022-05-10 DIAGNOSIS — Y92009 Unspecified place in unspecified non-institutional (private) residence as the place of occurrence of the external cause: Secondary | ICD-10-CM

## 2022-05-10 DIAGNOSIS — I63311 Cerebral infarction due to thrombosis of right middle cerebral artery: Secondary | ICD-10-CM | POA: Diagnosis not present

## 2022-05-10 DIAGNOSIS — I6601 Occlusion and stenosis of right middle cerebral artery: Secondary | ICD-10-CM | POA: Diagnosis not present

## 2022-05-10 DIAGNOSIS — Z823 Family history of stroke: Secondary | ICD-10-CM | POA: Diagnosis not present

## 2022-05-10 DIAGNOSIS — W1830XA Fall on same level, unspecified, initial encounter: Secondary | ICD-10-CM | POA: Diagnosis present

## 2022-05-10 DIAGNOSIS — I63511 Cerebral infarction due to unspecified occlusion or stenosis of right middle cerebral artery: Secondary | ICD-10-CM | POA: Diagnosis not present

## 2022-05-10 DIAGNOSIS — I10 Essential (primary) hypertension: Secondary | ICD-10-CM | POA: Diagnosis not present

## 2022-05-10 DIAGNOSIS — R471 Dysarthria and anarthria: Secondary | ICD-10-CM | POA: Diagnosis present

## 2022-05-10 DIAGNOSIS — R4189 Other symptoms and signs involving cognitive functions and awareness: Secondary | ICD-10-CM | POA: Diagnosis present

## 2022-05-10 DIAGNOSIS — E669 Obesity, unspecified: Secondary | ICD-10-CM | POA: Diagnosis present

## 2022-05-10 DIAGNOSIS — Y9301 Activity, walking, marching and hiking: Secondary | ICD-10-CM | POA: Diagnosis present

## 2022-05-10 DIAGNOSIS — Z1152 Encounter for screening for COVID-19: Secondary | ICD-10-CM | POA: Diagnosis not present

## 2022-05-10 DIAGNOSIS — I454 Nonspecific intraventricular block: Secondary | ICD-10-CM | POA: Diagnosis present

## 2022-05-10 DIAGNOSIS — I129 Hypertensive chronic kidney disease with stage 1 through stage 4 chronic kidney disease, or unspecified chronic kidney disease: Secondary | ICD-10-CM | POA: Diagnosis present

## 2022-05-10 DIAGNOSIS — Z87891 Personal history of nicotine dependence: Secondary | ICD-10-CM | POA: Diagnosis not present

## 2022-05-10 DIAGNOSIS — R29810 Facial weakness: Secondary | ICD-10-CM | POA: Diagnosis not present

## 2022-05-10 DIAGNOSIS — R29713 NIHSS score 13: Secondary | ICD-10-CM | POA: Diagnosis not present

## 2022-05-10 DIAGNOSIS — I69398 Other sequelae of cerebral infarction: Secondary | ICD-10-CM | POA: Diagnosis not present

## 2022-05-10 DIAGNOSIS — I482 Chronic atrial fibrillation, unspecified: Secondary | ICD-10-CM | POA: Diagnosis present

## 2022-05-10 DIAGNOSIS — R29710 NIHSS score 10: Secondary | ICD-10-CM | POA: Diagnosis present

## 2022-05-10 DIAGNOSIS — Z7901 Long term (current) use of anticoagulants: Secondary | ICD-10-CM

## 2022-05-10 DIAGNOSIS — N189 Chronic kidney disease, unspecified: Secondary | ICD-10-CM | POA: Diagnosis present

## 2022-05-10 DIAGNOSIS — I63411 Cerebral infarction due to embolism of right middle cerebral artery: Secondary | ICD-10-CM

## 2022-05-10 DIAGNOSIS — R29818 Other symptoms and signs involving the nervous system: Secondary | ICD-10-CM | POA: Diagnosis not present

## 2022-05-10 DIAGNOSIS — G8194 Hemiplegia, unspecified affecting left nondominant side: Secondary | ICD-10-CM | POA: Diagnosis present

## 2022-05-10 DIAGNOSIS — R414 Neurologic neglect syndrome: Secondary | ICD-10-CM | POA: Diagnosis present

## 2022-05-10 DIAGNOSIS — N1831 Chronic kidney disease, stage 3a: Secondary | ICD-10-CM | POA: Diagnosis not present

## 2022-05-10 DIAGNOSIS — K118 Other diseases of salivary glands: Secondary | ICD-10-CM | POA: Diagnosis not present

## 2022-05-10 DIAGNOSIS — Z8249 Family history of ischemic heart disease and other diseases of the circulatory system: Secondary | ICD-10-CM | POA: Diagnosis not present

## 2022-05-10 DIAGNOSIS — I6930 Unspecified sequelae of cerebral infarction: Secondary | ICD-10-CM | POA: Diagnosis present

## 2022-05-10 DIAGNOSIS — I639 Cerebral infarction, unspecified: Secondary | ICD-10-CM | POA: Diagnosis not present

## 2022-05-10 DIAGNOSIS — Z86010 Personal history of colonic polyps: Secondary | ICD-10-CM

## 2022-05-10 DIAGNOSIS — G4700 Insomnia, unspecified: Secondary | ICD-10-CM | POA: Diagnosis not present

## 2022-05-10 DIAGNOSIS — R29712 NIHSS score 12: Secondary | ICD-10-CM | POA: Diagnosis not present

## 2022-05-10 DIAGNOSIS — I4891 Unspecified atrial fibrillation: Secondary | ICD-10-CM | POA: Diagnosis not present

## 2022-05-10 DIAGNOSIS — Z79899 Other long term (current) drug therapy: Secondary | ICD-10-CM | POA: Diagnosis not present

## 2022-05-10 DIAGNOSIS — Z6830 Body mass index (BMI) 30.0-30.9, adult: Secondary | ICD-10-CM

## 2022-05-10 DIAGNOSIS — R1312 Dysphagia, oropharyngeal phase: Secondary | ICD-10-CM | POA: Diagnosis not present

## 2022-05-10 DIAGNOSIS — I63519 Cerebral infarction due to unspecified occlusion or stenosis of unspecified middle cerebral artery: Secondary | ICD-10-CM | POA: Diagnosis not present

## 2022-05-10 DIAGNOSIS — G479 Sleep disorder, unspecified: Secondary | ICD-10-CM | POA: Diagnosis not present

## 2022-05-10 DIAGNOSIS — R54 Age-related physical debility: Secondary | ICD-10-CM | POA: Diagnosis present

## 2022-05-10 HISTORY — PX: RADIOLOGY WITH ANESTHESIA: SHX6223

## 2022-05-10 HISTORY — PX: IR CT HEAD LTD: IMG2386

## 2022-05-10 HISTORY — PX: IR PERCUTANEOUS ART THROMBECTOMY/INFUSION INTRACRANIAL INC DIAG ANGIO: IMG6087

## 2022-05-10 LAB — APTT: aPTT: 39 seconds — ABNORMAL HIGH (ref 24–36)

## 2022-05-10 LAB — CBC WITH DIFFERENTIAL/PLATELET
Abs Immature Granulocytes: 0.1 10*3/uL — ABNORMAL HIGH (ref 0.00–0.07)
Basophils Absolute: 0 10*3/uL (ref 0.0–0.1)
Basophils Relative: 0 %
Eosinophils Absolute: 0 10*3/uL (ref 0.0–0.5)
Eosinophils Relative: 0 %
HCT: 46.7 % (ref 39.0–52.0)
Hemoglobin: 16 g/dL (ref 13.0–17.0)
Immature Granulocytes: 1 %
Lymphocytes Relative: 11 %
Lymphs Abs: 1.2 10*3/uL (ref 0.7–4.0)
MCH: 33.3 pg (ref 26.0–34.0)
MCHC: 34.3 g/dL (ref 30.0–36.0)
MCV: 97.3 fL (ref 80.0–100.0)
Monocytes Absolute: 1 10*3/uL (ref 0.1–1.0)
Monocytes Relative: 8 %
Neutro Abs: 9.2 10*3/uL — ABNORMAL HIGH (ref 1.7–7.7)
Neutrophils Relative %: 80 %
Platelets: 164 10*3/uL (ref 150–400)
RBC: 4.8 MIL/uL (ref 4.22–5.81)
RDW: 13.5 % (ref 11.5–15.5)
WBC: 11.6 10*3/uL — ABNORMAL HIGH (ref 4.0–10.5)
nRBC: 0 % (ref 0.0–0.2)

## 2022-05-10 LAB — BASIC METABOLIC PANEL
Anion gap: 10 (ref 5–15)
BUN: 19 mg/dL (ref 8–23)
CO2: 22 mmol/L (ref 22–32)
Calcium: 8.6 mg/dL — ABNORMAL LOW (ref 8.9–10.3)
Chloride: 103 mmol/L (ref 98–111)
Creatinine, Ser: 1.43 mg/dL — ABNORMAL HIGH (ref 0.61–1.24)
GFR, Estimated: 48 mL/min — ABNORMAL LOW (ref >60)
Glucose, Bld: 183 mg/dL — ABNORMAL HIGH (ref 70–99)
Potassium: 3.8 mmol/L (ref 3.5–5.1)
Sodium: 135 mmol/L (ref 135–145)

## 2022-05-10 LAB — ECHOCARDIOGRAM COMPLETE
AR max vel: 1.61 cm2
AV Area VTI: 1.55 cm2
AV Area mean vel: 1.68 cm2
AV Mean grad: 19 mmHg
AV Peak grad: 34.5 mmHg
Ao pk vel: 2.94 m/s
Area-P 1/2: 4.68 cm2
Height: 71.5 in
S' Lateral: 3.7 cm
Weight: 3569.69 oz

## 2022-05-10 LAB — CBG MONITORING, ED: Glucose-Capillary: 135 mg/dL — ABNORMAL HIGH (ref 70–99)

## 2022-05-10 LAB — COMPREHENSIVE METABOLIC PANEL
ALT: 20 U/L (ref 0–44)
AST: 27 U/L (ref 15–41)
Albumin: 4.1 g/dL (ref 3.5–5.0)
Alkaline Phosphatase: 93 U/L (ref 38–126)
Anion gap: 13 (ref 5–15)
BUN: 20 mg/dL (ref 8–23)
CO2: 21 mmol/L — ABNORMAL LOW (ref 22–32)
Calcium: 9.2 mg/dL (ref 8.9–10.3)
Chloride: 103 mmol/L (ref 98–111)
Creatinine, Ser: 1.33 mg/dL — ABNORMAL HIGH (ref 0.61–1.24)
GFR, Estimated: 52 mL/min — ABNORMAL LOW (ref >60)
Glucose, Bld: 158 mg/dL — ABNORMAL HIGH (ref 70–99)
Potassium: 3.5 mmol/L (ref 3.5–5.1)
Sodium: 137 mmol/L (ref 135–145)
Total Bilirubin: 0.4 mg/dL (ref 0.3–1.2)
Total Protein: 7 g/dL (ref 6.5–8.1)

## 2022-05-10 LAB — DIFFERENTIAL
Abs Immature Granulocytes: 0.05 10*3/uL (ref 0.00–0.07)
Basophils Absolute: 0 10*3/uL (ref 0.0–0.1)
Basophils Relative: 0 %
Eosinophils Absolute: 0.2 10*3/uL (ref 0.0–0.5)
Eosinophils Relative: 2 %
Immature Granulocytes: 1 %
Lymphocytes Relative: 17 %
Lymphs Abs: 1.6 10*3/uL (ref 0.7–4.0)
Monocytes Absolute: 0.8 10*3/uL (ref 0.1–1.0)
Monocytes Relative: 9 %
Neutro Abs: 6.6 10*3/uL (ref 1.7–7.7)
Neutrophils Relative %: 71 %

## 2022-05-10 LAB — SARS CORONAVIRUS 2 BY RT PCR: SARS Coronavirus 2 by RT PCR: NEGATIVE

## 2022-05-10 LAB — CBC
HCT: 49.6 % (ref 39.0–52.0)
Hemoglobin: 16.7 g/dL (ref 13.0–17.0)
MCH: 33.1 pg (ref 26.0–34.0)
MCHC: 33.7 g/dL (ref 30.0–36.0)
MCV: 98.4 fL (ref 80.0–100.0)
Platelets: 141 10*3/uL — ABNORMAL LOW (ref 150–400)
RBC: 5.04 MIL/uL (ref 4.22–5.81)
RDW: 13.5 % (ref 11.5–15.5)
WBC: 9.2 10*3/uL (ref 4.0–10.5)
nRBC: 0 % (ref 0.0–0.2)

## 2022-05-10 LAB — I-STAT CHEM 8, ED
BUN: 25 mg/dL — ABNORMAL HIGH (ref 8–23)
Calcium, Ion: 1.06 mmol/L — ABNORMAL LOW (ref 1.15–1.40)
Chloride: 103 mmol/L (ref 98–111)
Creatinine, Ser: 1.3 mg/dL — ABNORMAL HIGH (ref 0.61–1.24)
Glucose, Bld: 157 mg/dL — ABNORMAL HIGH (ref 70–99)
HCT: 49 % (ref 39.0–52.0)
Hemoglobin: 16.7 g/dL (ref 13.0–17.0)
Potassium: 3.7 mmol/L (ref 3.5–5.1)
Sodium: 139 mmol/L (ref 135–145)
TCO2: 23 mmol/L (ref 22–32)

## 2022-05-10 LAB — ETHANOL: Alcohol, Ethyl (B): <10 mg/dL (ref <10)

## 2022-05-10 LAB — LIPID PANEL
Cholesterol: 134 mg/dL (ref 0–200)
HDL: 40 mg/dL — ABNORMAL LOW (ref >40)
LDL Cholesterol: 56 mg/dL (ref 0–99)
Total CHOL/HDL Ratio: 3.4 RATIO
Triglycerides: 190 mg/dL — ABNORMAL HIGH (ref <150)
VLDL: 38 mg/dL (ref 0–40)

## 2022-05-10 LAB — MRSA NEXT GEN BY PCR, NASAL: MRSA by PCR Next Gen: NOT DETECTED

## 2022-05-10 LAB — HEMOGLOBIN A1C
Hgb A1c MFr Bld: 5.8 % — ABNORMAL HIGH (ref 4.8–5.6)
Mean Plasma Glucose: 119.76 mg/dL

## 2022-05-10 LAB — PROTIME-INR
INR: 2.2 — ABNORMAL HIGH (ref 0.8–1.2)
Prothrombin Time: 24.4 seconds — ABNORMAL HIGH (ref 11.4–15.2)

## 2022-05-10 SURGERY — IR WITH ANESTHESIA
Anesthesia: General

## 2022-05-10 MED ORDER — ACETAMINOPHEN 650 MG RE SUPP: 650.0000 mg | RECTAL | Status: DC | PRN

## 2022-05-10 MED ORDER — ORAL CARE MOUTH RINSE
15.0000 mL | OROMUCOSAL | Status: DC | PRN
  Administered 2022-05-10: 15 mL via OROMUCOSAL

## 2022-05-10 MED ORDER — ACETAMINOPHEN 325 MG PO TABS
650.0000 mg | ORAL_TABLET | ORAL | Status: DC | PRN
  Administered 2022-05-10 – 2022-05-14 (×6): 650 mg via ORAL
  Filled 2022-05-10 (×6): qty 2

## 2022-05-10 MED ORDER — PHENYLEPHRINE HCL-NACL 20-0.9 MG/250ML-% IV SOLN
INTRAVENOUS | Status: DC | PRN
  Administered 2022-05-10: 100 ug/min via INTRAVENOUS

## 2022-05-10 MED ORDER — CEFAZOLIN SODIUM-DEXTROSE 2-3 GM-%(50ML) IV SOLR
INTRAVENOUS | Status: DC | PRN
  Administered 2022-05-10: 2 g via INTRAVENOUS

## 2022-05-10 MED ORDER — ACETAMINOPHEN 325 MG PO TABS: 650.0000 mg | ORAL_TABLET | ORAL | Status: DC | PRN

## 2022-05-10 MED ORDER — ACETAMINOPHEN 160 MG/5ML PO SOLN: 650.0000 mg | ORAL | Status: DC | PRN

## 2022-05-10 MED ORDER — CLEVIDIPINE BUTYRATE 0.5 MG/ML IV EMUL
0.0000 mg/h | INTRAVENOUS | Status: AC
  Administered 2022-05-10 (×2): 8 mg/h via INTRAVENOUS
  Administered 2022-05-10 (×2): 6 mg/h via INTRAVENOUS
  Administered 2022-05-11: 4 mg/h via INTRAVENOUS
  Filled 2022-05-10 (×5): qty 50

## 2022-05-10 MED ORDER — SODIUM CHLORIDE 0.9 % IV SOLN: INTRAVENOUS | Status: DC | PRN

## 2022-05-10 MED ORDER — NITROGLYCERIN 1 MG/10 ML FOR IR/CATH LAB
INTRA_ARTERIAL | Status: AC
  Administered 2022-05-10 (×4): 25 ug via INTRA_ARTERIAL
  Filled 2022-05-10: qty 10

## 2022-05-10 MED ORDER — METOPROLOL SUCCINATE ER 25 MG PO TB24
12.5000 mg | ORAL_TABLET | Freq: Every day | ORAL | Status: DC
  Administered 2022-05-10 – 2022-05-11 (×2): 12.5 mg via ORAL
  Filled 2022-05-10 (×2): qty 1

## 2022-05-10 MED ORDER — SODIUM CHLORIDE 0.9 % IV SOLN: INTRAVENOUS | Status: DC

## 2022-05-10 MED ORDER — SUGAMMADEX SODIUM 200 MG/2ML IV SOLN
INTRAVENOUS | Status: DC | PRN
  Administered 2022-05-10: 200 mg via INTRAVENOUS

## 2022-05-10 MED ORDER — IOHEXOL 300 MG/ML  SOLN
150.0000 mL | Freq: Once | INTRAMUSCULAR | Status: AC | PRN
  Administered 2022-05-10: 50 mL via INTRA_ARTERIAL

## 2022-05-10 MED ORDER — HEPARIN SODIUM (PORCINE) 5000 UNIT/ML IJ SOLN
5000.0000 [IU] | Freq: Three times a day (TID) | INTRAMUSCULAR | Status: DC
  Administered 2022-05-10 – 2022-05-16 (×18): 5000 [IU] via SUBCUTANEOUS
  Filled 2022-05-10 (×17): qty 1

## 2022-05-10 MED ORDER — CLEVIDIPINE BUTYRATE 0.5 MG/ML IV EMUL
INTRAVENOUS | Status: AC
  Administered 2022-05-10: 8 mg/h via INTRAVENOUS
  Filled 2022-05-10: qty 50

## 2022-05-10 MED ORDER — SODIUM CHLORIDE 0.9% FLUSH
3.0000 mL | Freq: Once | INTRAVENOUS | Status: AC
  Administered 2022-05-10: 3 mL via INTRAVENOUS

## 2022-05-10 MED ORDER — LIDOCAINE HCL (CARDIAC) PF 50 MG/5ML IV SOSY
PREFILLED_SYRINGE | INTRAVENOUS | Status: DC | PRN
  Administered 2022-05-10: 40 mg via INTRAVENOUS

## 2022-05-10 MED ORDER — ROCURONIUM BROMIDE 100 MG/10ML IV SOLN
INTRAVENOUS | Status: DC | PRN
  Administered 2022-05-10: 70 mg via INTRAVENOUS
  Administered 2022-05-10: 10 mg via INTRAVENOUS

## 2022-05-10 MED ORDER — PHENYLEPHRINE HCL (PRESSORS) 10 MG/ML IV SOLN
INTRAVENOUS | Status: DC | PRN
  Administered 2022-05-10 (×2): 160 ug via INTRAVENOUS

## 2022-05-10 MED ORDER — VASOPRESSIN 20 UNIT/ML IV SOLN
INTRAVENOUS | Status: DC | PRN
  Administered 2022-05-10 (×3): 1 [IU] via INTRAVENOUS

## 2022-05-10 MED ORDER — IOHEXOL 350 MG/ML SOLN
150.0000 mL | Freq: Once | INTRAVENOUS | Status: AC | PRN
  Administered 2022-05-10: 20 mL via INTRA_ARTERIAL

## 2022-05-10 MED ORDER — ONDANSETRON HCL 4 MG/2ML IJ SOLN
INTRAMUSCULAR | Status: DC | PRN
  Administered 2022-05-10: 4 mg via INTRAVENOUS

## 2022-05-10 MED ORDER — CLEVIDIPINE BUTYRATE 0.5 MG/ML IV EMUL
INTRAVENOUS | Status: DC | PRN
  Administered 2022-05-10: 2 mg/h via INTRAVENOUS

## 2022-05-10 MED ORDER — ASPIRIN 325 MG PO TBEC
325.0000 mg | DELAYED_RELEASE_TABLET | Freq: Every day | ORAL | Status: DC
  Administered 2022-05-10 – 2022-05-15 (×6): 325 mg via ORAL
  Filled 2022-05-10 (×6): qty 1

## 2022-05-10 MED ORDER — CHLORHEXIDINE GLUCONATE CLOTH 2 % EX PADS
6.0000 | MEDICATED_PAD | Freq: Every day | CUTANEOUS | Status: DC
  Administered 2022-05-10 – 2022-05-18 (×9): 6 via TOPICAL

## 2022-05-10 MED ORDER — PROPOFOL 10 MG/ML IV BOLUS
INTRAVENOUS | Status: DC | PRN
  Administered 2022-05-10: 100 mg via INTRAVENOUS

## 2022-05-10 MED ORDER — IOHEXOL 350 MG/ML SOLN
75.0000 mL | Freq: Once | INTRAVENOUS | Status: AC | PRN
  Administered 2022-05-10: 75 mL via INTRAVENOUS

## 2022-05-10 MED ORDER — SIMVASTATIN 20 MG PO TABS
40.0000 mg | ORAL_TABLET | Freq: Every day | ORAL | Status: DC
  Administered 2022-05-10: 40 mg via ORAL
  Filled 2022-05-10: qty 2

## 2022-05-10 MED ORDER — CEFAZOLIN SODIUM-DEXTROSE 2-4 GM/100ML-% IV SOLN
INTRAVENOUS | Status: AC
  Filled 2022-05-10: qty 100

## 2022-05-10 MED ORDER — SUCCINYLCHOLINE CHLORIDE 20 MG/ML IJ SOLN
INTRAMUSCULAR | Status: DC | PRN
  Administered 2022-05-10: 140 mg via INTRAVENOUS

## 2022-05-10 MED ORDER — SENNOSIDES-DOCUSATE SODIUM 8.6-50 MG PO TABS
1.0000 | ORAL_TABLET | Freq: Every evening | ORAL | Status: DC | PRN
  Administered 2022-05-13: 1 via ORAL
  Filled 2022-05-10: qty 1

## 2022-05-10 MED ORDER — LABETALOL HCL 5 MG/ML IV SOLN
5.0000 mg | INTRAVENOUS | Status: DC | PRN
  Administered 2022-05-10: 10 mg via INTRAVENOUS
  Filled 2022-05-10: qty 4

## 2022-05-10 MED ORDER — PHENYLEPHRINE HCL (PRESSORS) 10 MG/ML IV SOLN: INTRAVENOUS | Status: DC | PRN

## 2022-05-10 MED ORDER — STROKE: EARLY STAGES OF RECOVERY BOOK
Freq: Once | Status: AC
  Filled 2022-05-10: qty 1

## 2022-05-10 NOTE — Anesthesia Procedure Notes (Signed)
Arterial Line Insertion Start/End2/21/2024 1:50 AM, 05/10/2022 1:53 AM Performed by: Effie Berkshire, MD, anesthesiologist  Patient location: Pre-op. Preanesthetic checklist: patient identified, IV checked, site marked, risks and benefits discussed, surgical consent, monitors and equipment checked, pre-op evaluation, timeout performed and anesthesia consent Lidocaine 1% used for infiltration Left, radial was placed Catheter size: 20 G Hand hygiene performed  and maximum sterile barriers used   Attempts: 1 Procedure performed without using ultrasound guided technique. Following insertion, dressing applied and Biopatch. Post procedure assessment: normal and unchanged  Patient tolerated the procedure well with no immediate complications.

## 2022-05-10 NOTE — ED Notes (Signed)
Pt's wife Johnatan Lyndon has signed pt's consent for IR

## 2022-05-10 NOTE — Progress Notes (Signed)
Echocardiogram 2D Echocardiogram has been performed.  Edward Maynard 05/10/2022, 10:27 AM

## 2022-05-10 NOTE — Progress Notes (Signed)
SLP Cancellation Note  Patient Details Name: Edward Maynard MRN: [id number] DOB: 12/08/1933   Cancelled treatment:       Reason Eval/Treat Not Completed: Patient at procedure or test/unavailable.    Kashaun Bebo, Katherene Ponto 05/10/2022, 10:01 AM

## 2022-05-10 NOTE — Progress Notes (Addendum)
STROKE TEAM PROGRESS NOTE   SUBJECTIVE (INTERVAL HISTORY) His RN is at the bedside.  Overall his condition is unchanged. Pt awake alert but has left HH, left neglect, left facial droop and left hemiparesis worse than baseline. S/p IR overnight, pending MRI and MRA.    OBJECTIVE Temp:  [97.5 F (36.4 C)-98.2 F (36.8 C)] 97.5 F (36.4 C) (02/21 0800) Pulse Rate:  [51-156] 83 (02/21 1100) Cardiac Rhythm: Normal sinus rhythm (02/21 0730) Resp:  [13-25] 17 (02/21 0800) BP: (110-187)/(52-162) 160/90 (02/21 0530) SpO2:  [91 %-100 %] 91 % (02/21 1100) Arterial Line BP: (109-150)/(47-60) 142/47 (02/21 1100) FiO2 (%):  [30 %] 30 % (02/21 0500) Weight:  [101.2 kg] 101.2 kg (02/21 0500)  Recent Labs  Lab 05/10/22 0003  GLUCAP 135*   Recent Labs  Lab 05/10/22 0005 05/10/22 0010 05/10/22 0545  NA 137 139 135  K 3.5 3.7 3.8  CL 103 103 103  CO2 21*  --  22  GLUCOSE 158* 157* 183*  BUN 20 25* 19  CREATININE 1.33* 1.30* 1.43*  CALCIUM 9.2  --  8.6*   Recent Labs  Lab 05/10/22 0005  AST 27  ALT 20  ALKPHOS 93  BILITOT 0.4  PROT 7.0  ALBUMIN 4.1   Recent Labs  Lab 05/10/22 0005 05/10/22 0010 05/10/22 0545  WBC 9.2  --  11.6*  NEUTROABS 6.6  --  9.2*  HGB 16.7 16.7 16.0  HCT 49.6 49.0 46.7  MCV 98.4  --  97.3  PLT 141*  --  164   No results for input(s): "CKTOTAL", "CKMB", "CKMBINDEX", "TROPONINI" in the last 168 hours. Recent Labs    05/10/22 0005  LABPROT 24.4*  INR 2.2*   No results for input(s): "COLORURINE", "LABSPEC", "PHURINE", "GLUCOSEU", "HGBUR", "BILIRUBINUR", "KETONESUR", "PROTEINUR", "UROBILINOGEN", "NITRITE", "LEUKOCYTESUR" in the last 72 hours.  Invalid input(s): "APPERANCEUR"     Component Value Date/Time   CHOL 134 05/10/2022 0545   TRIG 190 (H) 05/10/2022 0545   HDL 40 (L) 05/10/2022 0545   CHOLHDL 3.4 05/10/2022 0545   VLDL 38 05/10/2022 0545   LDLCALC 56 05/10/2022 0545   Lab Results  Component Value Date   HGBA1C 5.8 (H) 05/10/2022    No results found for: "LABOPIA", "COCAINSCRNUR", "LABBENZ", "AMPHETMU", "THCU", "LABBARB"  Recent Labs  Lab 05/10/22 0005  ETH <10    I have personally reviewed the radiological images below and agree with the radiology interpretations.  ECHOCARDIOGRAM COMPLETE  Result Date: 05/10/2022    ECHOCARDIOGRAM REPORT   Patient Name:   JUANPABLO ROSSMILLER Date of Exam: 05/10/2022 Medical Rec #:  [id number]     Height:       71.5 in Accession #:    [id number]    Weight:       223.1 lb Date of Birth:  11-15-1933    BSA:          2.220 m Patient Age:    89 years      BP:           160/90 mmHg Patient Gender: M             HR:           73 bpm. Exam Location:  Inpatient Procedure: 2D Echo, Cardiac Doppler and Color Doppler Indications:    Stroke I63.9  History:        Patient has prior history of Echocardiogram examinations and  Patient has no prior history of Echocardiogram examinations.                 Pacemaker, Stroke, Arrythmias:PVC and Tachycardia; Risk                 Factors:Dyslipidemia.  Sonographer:    Ronny Flurry Referring Phys: [phone] ERIC LINDZEN  Sonographer Comments: Image acquisition challenging due to respiratory motion. IMPRESSIONS  1. Left ventricular ejection fraction, by estimation, is 60 to 65%. The left ventricle has normal function. The left ventricle has no regional wall motion abnormalities. Left ventricular diastolic parameters are consistent with Grade II diastolic dysfunction (pseudonormalization).  2. Right ventricular systolic function is normal. The right ventricular size is normal. There is normal pulmonary artery systolic pressure.  3. The mitral valve is normal in structure. No evidence of mitral valve regurgitation. No evidence of mitral stenosis.  4. The aortic valve is calcified. Aortic valve regurgitation is trivial. Mild aortic valve stenosis. Aortic valve area, by VTI measures 1.55 cm. Aortic valve mean gradient measures 19.0 mmHg. Aortic valve Vmax measures  2.94 m/s.  5. The inferior vena cava is normal in size with greater than 50% respiratory variability, suggesting right atrial pressure of 3 mmHg. FINDINGS  Left Ventricle: Left ventricular ejection fraction, by estimation, is 60 to 65%. The left ventricle has normal function. The left ventricle has no regional wall motion abnormalities. The left ventricular internal cavity size was normal in size. There is  no left ventricular hypertrophy. Left ventricular diastolic parameters are consistent with Grade II diastolic dysfunction (pseudonormalization). Indeterminate filling pressures. Right Ventricle: The right ventricular size is normal. No increase in right ventricular wall thickness. Right ventricular systolic function is normal. There is normal pulmonary artery systolic pressure. The tricuspid regurgitant velocity is 1.45 m/s, and  with an assumed right atrial pressure of 3 mmHg, the estimated right ventricular systolic pressure is [id number] mmHg. Left Atrium: Left atrial size was normal in size. Right Atrium: Right atrial size was normal in size. Pericardium: There is no evidence of pericardial effusion. Mitral Valve: The mitral valve is normal in structure. No evidence of mitral valve regurgitation. No evidence of mitral valve stenosis. Tricuspid Valve: The tricuspid valve is normal in structure. Tricuspid valve regurgitation is trivial. No evidence of tricuspid stenosis. Aortic Valve: The aortic valve is calcified. Aortic valve regurgitation is trivial. Mild aortic stenosis is present. Aortic valve mean gradient measures 19.0 mmHg. Aortic valve peak gradient measures 34.5 mmHg. Aortic valve area, by VTI measures 1.55 cm. Pulmonic Valve: The pulmonic valve was normal in structure. Pulmonic valve regurgitation is not visualized. No evidence of pulmonic stenosis. Aorta: The aortic root is normal in size and structure. Venous: The inferior vena cava is normal in size with greater than 50% respiratory variability,  suggesting right atrial pressure of 3 mmHg. IAS/Shunts: No atrial level shunt detected by color flow Doppler.  LEFT VENTRICLE PLAX 2D LVIDd:         4.60 cm   Diastology LVIDs:         3.70 cm   LV e' medial:    7.80 cm/s LV PW:         1.30 cm   LV E/e' medial:  10.4 LV IVS:        1.20 cm   LV e' lateral:   11.60 cm/s LVOT diam:     2.20 cm   LV E/e' lateral: 7.0 LV SV:         89 LV  SV Index:   40 LVOT Area:     3.80 cm  RIGHT VENTRICLE             IVC RV S prime:     16.60 cm/s  IVC diam: 2.10 cm TAPSE (M-mode): 1.5 cm LEFT ATRIUM             Index        RIGHT ATRIUM           Index LA diam:        4.60 cm 2.07 cm/m   RA Area:     15.10 cm LA Vol (A2C):   64.7 ml 29.14 ml/m  RA Volume:   31.80 ml  14.32 ml/m LA Vol (A4C):   58.7 ml 26.44 ml/m LA Biplane Vol: 62.9 ml 28.33 ml/m  AORTIC VALVE AV Area (Vmax):    1.61 cm AV Area (Vmean):   1.68 cm AV Area (VTI):     1.55 cm AV Vmax:           293.50 cm/s AV Vmean:          189.800 cm/s AV VTI:            0.570 m AV Peak Grad:      34.5 mmHg AV Mean Grad:      19.0 mmHg LVOT Vmax:         124.00 cm/s LVOT Vmean:        83.833 cm/s LVOT VTI:          0.233 m LVOT/AV VTI ratio: 0.41  AORTA Ao Root diam: 3.30 cm MITRAL VALVE               TRICUSPID VALVE MV Area (PHT): 4.68 cm    TR Peak grad:   8.4 mmHg MV Decel Time: 162 msec    TR Vmax:        145.00 cm/s MV E velocity: 81.00 cm/s MV A velocity: 65.50 cm/s  SHUNTS MV E/A ratio:  1.24        Systemic VTI:  0.23 m                            Systemic Diam: 2.20 cm Skeet Latch MD Electronically signed by Skeet Latch MD Signature Date/Time: 05/10/2022/10:48:39 AM    Final    CT HEAD CODE STROKE WO CONTRAST  Result Date: 05/10/2022 CLINICAL DATA:  Acute neurologic deficit EXAM: CT HEAD WITHOUT CONTRAST CT CERVICAL SPINE WITHOUT CONTRAST TECHNIQUE: Multidetector CT imaging of the head and cervical spine was performed following the standard protocol without intravenous contrast. Multiplanar CT image  reconstructions of the cervical spine were also generated. RADIATION DOSE REDUCTION: This exam was performed according to the departmental dose-optimization program which includes automated exposure control, adjustment of the mA and/or kV according to patient size and/or use of iterative reconstruction technique. COMPARISON:  None Available. FINDINGS: CT HEAD FINDINGS Brain: There is no mass, hemorrhage or extra-axial collection. Generalized atrophy. Multiple old small vessel infarcts of the cerebellum. There is hypoattenuation of the periventricular white matter, most commonly indicating chronic ischemic microangiopathy. Old right frontal and parietal infarcts. Vascular: No abnormal hyperdensity of the major intracranial arteries or dural venous sinuses. No intracranial atherosclerosis. Skull: The visualized skull base, calvarium and extracranial soft tissues are normal. Sinuses/Orbits: No fluid levels or advanced mucosal thickening of the visualized paranasal sinuses. No mastoid or middle ear effusion. The orbits are normal. ASPECTS [facility] Stroke  Program Early CT Score) - Ganglionic level infarction (caudate, lentiform nuclei, internal capsule, insula, M1-M3 cortex): 7 - Supraganglionic infarction (M4-M6 cortex): 3 Total score (0-10 with 10 being normal): 10 CT CERVICAL SPINE FINDINGS Alignment: No static subluxation. Facets are aligned. Occipital condyles and the lateral masses of C1 and C2 are normally approximated. Skull base and vertebrae: No acute fracture. Soft tissues and spinal canal: No prevertebral fluid or swelling. No visible canal hematoma. Disc levels: No advanced spinal canal or neural foraminal stenosis. Upper chest: No pneumothorax, pulmonary nodule or pleural effusion. Other: Normal visualized paraspinal cervical soft tissues. IMPRESSION: 1. No acute intracranial abnormality. 2. ASPECTS is 10. 3. No acute fracture or static subluxation of the cervical spine. These results were communicated to  Dr. Kerney Elbe at 12:24 am on 05/10/2022 by text page via the [facility] messaging system. Electronically Signed   By: Ulyses Jarred M.D.   On: 05/10/2022 00:47   CT C-SPINE NO CHARGE  Result Date: 05/10/2022 CLINICAL DATA:  Acute neurologic deficit EXAM: CT HEAD WITHOUT CONTRAST CT CERVICAL SPINE WITHOUT CONTRAST TECHNIQUE: Multidetector CT imaging of the head and cervical spine was performed following the standard protocol without intravenous contrast. Multiplanar CT image reconstructions of the cervical spine were also generated. RADIATION DOSE REDUCTION: This exam was performed according to the departmental dose-optimization program which includes automated exposure control, adjustment of the mA and/or kV according to patient size and/or use of iterative reconstruction technique. COMPARISON:  None Available. FINDINGS: CT HEAD FINDINGS Brain: There is no mass, hemorrhage or extra-axial collection. Generalized atrophy. Multiple old small vessel infarcts of the cerebellum. There is hypoattenuation of the periventricular white matter, most commonly indicating chronic ischemic microangiopathy. Old right frontal and parietal infarcts. Vascular: No abnormal hyperdensity of the major intracranial arteries or dural venous sinuses. No intracranial atherosclerosis. Skull: The visualized skull base, calvarium and extracranial soft tissues are normal. Sinuses/Orbits: No fluid levels or advanced mucosal thickening of the visualized paranasal sinuses. No mastoid or middle ear effusion. The orbits are normal. ASPECTS ([facility] Stroke Program Early CT Score) - Ganglionic level infarction (caudate, lentiform nuclei, internal capsule, insula, M1-M3 cortex): 7 - Supraganglionic infarction (M4-M6 cortex): 3 Total score (0-10 with 10 being normal): 10 CT CERVICAL SPINE FINDINGS Alignment: No static subluxation. Facets are aligned. Occipital condyles and the lateral masses of C1 and C2 are normally approximated. Skull base and  vertebrae: No acute fracture. Soft tissues and spinal canal: No prevertebral fluid or swelling. No visible canal hematoma. Disc levels: No advanced spinal canal or neural foraminal stenosis. Upper chest: No pneumothorax, pulmonary nodule or pleural effusion. Other: Normal visualized paraspinal cervical soft tissues. IMPRESSION: 1. No acute intracranial abnormality. 2. ASPECTS is 10. 3. No acute fracture or static subluxation of the cervical spine. These results were communicated to Dr. Kerney Elbe at 12:24 am on 05/10/2022 by text page via the [facility] messaging system. Electronically Signed   By: Ulyses Jarred M.D.   On: 05/10/2022 00:47   CT ANGIO HEAD NECK W WO CM (CODE STROKE)  Result Date: 05/10/2022 CLINICAL DATA:  Acute neurologic deficit EXAM: CT ANGIOGRAPHY HEAD AND NECK TECHNIQUE: Multidetector CT imaging of the head and neck was performed using the standard protocol during bolus administration of intravenous contrast. Multiplanar CT image reconstructions and MIPs were obtained to evaluate the vascular anatomy. Carotid stenosis measurements (when applicable) are obtained utilizing NASCET criteria, using the distal internal carotid diameter as the denominator. RADIATION DOSE REDUCTION: This exam was performed according to the departmental  dose-optimization program which includes automated exposure control, adjustment of the mA and/or kV according to patient size and/or use of iterative reconstruction technique. COMPARISON:  None Available. FINDINGS: CTA NECK FINDINGS SKELETON: There is no bony spinal canal stenosis. No lytic or blastic lesion. OTHER NECK: 12 mm right parotid nodule (series 7 image 81). UPPER CHEST: No pneumothorax or pleural effusion. No nodules or masses. AORTIC ARCH: There is calcific atherosclerosis of the aortic arch. There is no aneurysm, dissection or hemodynamically significant stenosis of the visualized portion of the aorta. Normal variant aortic arch branching pattern with the  left vertebral artery arising independently from the aortic arch. The visualized proximal subclavian arteries are widely patent. RIGHT CAROTID SYSTEM: Atherosclerotic web at the bifurcation. Mixed density plaque with less than 50% stenosis. Widely patent ICA. LEFT CAROTID SYSTEM: Calcific atherosclerosis at the bifurcation without hemodynamically significant stenosis. VERTEBRAL ARTERIES: Right dominant configuration. Both origins are clearly patent. There is no dissection, occlusion or flow-limiting stenosis to the skull base (V1-V3 segments). CTA HEAD FINDINGS POSTERIOR CIRCULATION: --Vertebral arteries: Right V4 segment atherosclerosis with widely maintained patency. --Inferior cerebellar arteries: Normal. --Basilar artery: Normal. --Superior cerebellar arteries: Normal. --Posterior cerebral arteries (PCA): Normal. ANTERIOR CIRCULATION: --Intracranial internal carotid arteries: Atherosclerotic calcification of the internal carotid arteries at the skull base without hemodynamically significant stenosis. --Anterior cerebral arteries (ACA): Normal. Both A1 segments are present. Patent anterior communicating artery (a-comm). --Middle cerebral arteries (MCA): There is occlusion of the proximal M2 segment inferior division of the right MCA (series 10, image 114). Left MCA is normal. VENOUS SINUSES: As permitted by contrast timing, patent. ANATOMIC VARIANTS: Fetal origin of the right posterior cerebral artery. Review of the MIP images confirms the above findings. IMPRESSION: 1. Occlusion of the proximal M2 segment inferior division of the right MCA. 2. 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 3. Nonspecific 12 mm right parotid nodule. 4. Critical Value/emergent results were called by telephone at the time of interpretation on 05/10/2022 at 12:38 am to provider ERIC [facility] , who verbally acknowledged these results. By Electronically Signed   By: Ulyses Jarred M.D.   On: 05/10/2022  00:39     PHYSICAL EXAM  Temp:  [97.5 F (36.4 C)-98.2 F (36.8 C)] 97.5 F (36.4 C) (02/21 0800) Pulse Rate:  [51-156] 83 (02/21 1100) Resp:  [13-25] 17 (02/21 0800) BP: (110-187)/(52-162) 160/90 (02/21 0530) SpO2:  [91 %-100 %] 91 % (02/21 1100) Arterial Line BP: (109-150)/(47-60) 142/47 (02/21 1100) FiO2 (%):  [30 %] 30 % (02/21 0500) Weight:  [101.2 kg] 101.2 kg (02/21 0500)  General - Well nourished, well developed, in no apparent distress.  Ophthalmologic - fundi not visualized due to noncooperation.  Cardiovascular - irregularly irregular heart rate and rhythm.  Neuro - awake, alert, eyes open, orientated to age, place, time. No aphasia but moderate dysarthria, following all simple commands. Able to name and repeat with dysarthric voice. Right gaze preference and barely cross midline, tracking on the right visual field, not blinking to visual threat on the left. Left facial droop. Tongue protrusion to the left. LUE deltoid 1/5, bicep and tricep 3-/5, finger grip 0/5. Left LE proximal 3-/5, distal 4-/5. RUE 4/5 and RLE 3/5 at least but on knee brace to avoid movement. Left sensory diminished with neglect. Right FTN intact. Gait not tested.    ASSESSMENT/PLAN Mr. HALFORD SPAYD is a 89 y.o. male with history of A-fib on Xarelto, hyperlipidemia, stroke in 05/2013 admitted for left-sided weakness and  he fell at home. No tPA given due to outside window.    Stroke:  right MCA infarct due to right M2 occlusion status post IR with  [id number], embolic secondary to A-fib given on Xarelto  CT no acute abnormality CTA head and neck right M2 proximal occlusion, right ICA bulb atherosclerosis IR with right M2 distal occlusion. S/p 4 passes, TICI2c  MRI pending MRA pending 2D Echo EF 60 to 65% LDL 56 HgbA1c 5.8 Heparin subcu for VTE prophylaxis Xarelto (rivaroxaban) daily prior to admission, now on No antithrombotic while pending MRI for further [facility] decision Ongoing aggressive stroke risk  factor management Therapy recommendations: Pending Disposition: Pending  History of stroke 05/2013 admitted for left-sided weakness and imbalance.  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  MRA/TTE/carotid Doppler negative.  Discharged on aspirin and Zocor. Outpatient 30-day CardioNet monitoring negative and loop recorder placed.  Found to have A-fib in 12/2014, started on Xarelto.  Mild left sided residual at baseline.  Chronic A-fib On Xarelto at home Currently [facility] on hold pending MRI Once appropriate, will consider Eliquis for further stroke prevention.  Hypertension Stable BP goal 120 - 140 within 24 hours of IR. Clarified as needed Long term BP goal normotensive  Hyperlipidemia Home meds: Zocor 40 LDL 56, goal < 70 Will resume Zocor 40 after p.o. access No high intensity statin given LDL at goal Continue statin at discharge  Other Stroke Risk Factors Advanced age Obesity, Body mass index is 30.68 kg/m.   Other Active Problems CKD 3A, creatinine 1.30-1.43 Mild leukocytosis, WBC 11.6  Hospital day # 0  This patient is critically ill due to stroke status post IR, A-fib, history of stroke and at significant risk of neurological worsening, death form recurrent stroke, hemorrhagic transformation, heart failure. This patient's care requires constant monitoring of vital signs, hemodynamics, respiratory and cardiac monitoring, review of multiple databases, neurological assessment, discussion with family, other specialists and medical decision making of high complexity. I spent 30 minutes of neurocritical care time in the care of this patient.   Rosalin Hawking, MD PhD Stroke Neurology 05/10/2022 11:26 AM    To contact Stroke Continuity provider, please refer to [email or web address] After hours, contact General Neurology

## 2022-05-10 NOTE — ED Notes (Signed)
Family has agreed for pt to go to IR, Dr. Cheral Marker has initiated carelink for [facility] IR team

## 2022-05-10 NOTE — ED Notes (Signed)
Family waiting in lobby outside [employer]

## 2022-05-10 NOTE — H&P (Signed)
NEUROHOSPITALIST ADMISSION HISTORY AND PHYSICAL   Requestig physician: Dr. Betsey Holiday  Reason for Consult: Acute onset of left hemiplegia  History obtained from:  EMS, Patient and Chart     HPI:                                                                                                                                          Edward Maynard is an 89 y.o. male with a PMHx of atrial fibrillation, hypercholesterolemia, prior loop recorder placement (2015), prior right frontal MCA/ACA & left parietal MCA/PCA watershed area infarctions in 2015 with residual left arm and hand weakness who presents from home via EMS as a Code Stroke after he fell at home this evening and was then noted by family to have left sided weakness with inability to get back up. BP per EMS was 118/72. He is on Xarelto at home and has been compliant. He denies any headache. He is unaware of any left sided weakness on arrival to the ER, despite clear left hemiparesis noted on exam. LKN 1845.  Modified Rankin Scale: 0  Of note, the patient had implantable loop recorder placed on 10/31/2013 that revealed atrial fibrillation and patient was started on Xarelto at that time.   Past Medical History:  Diagnosis Date   High cholesterol    05/2013; no defecits   Hyperlipemia    Left arm weakness    Left hand weakness    Personal history of colonic polyp - adenoma  10/23/2013   Stroke ([place]) 05/2013   MRI on 07/02/13 = Multifocal acute & subacute infarction involving right frontal MCA/ACA & left parietal MCA/PCA watershed areas    Past Surgical History:  Procedure Laterality Date   LOOP RECORDER IMPLANT N/A 10/31/2013   Procedure: LOOP RECORDER IMPLANT;  Surgeon: Coralyn Mark, MD;  Location: [facility];  Service: Cardiovascular;  Laterality: N/A;   SHOULDER SURGERY Bilateral 2003, 1995    Family History  Problem Relation Age of Onset   Hypertension Mother    Hypertension Father    Stroke Father    COPD  Brother    Stroke Brother    Colon cancer Neg Hx    Heart attack Neg Hx               Social History:  reports that he quit smoking about 52 years ago. His smoking use included cigarettes. He has never used smokeless tobacco. He reports that he does not drink alcohol and does not use drugs.  No Known Allergies  HOME MEDICATIONS:  No current facility-administered medications on file prior to encounter.   Current Outpatient Medications on File Prior to Encounter  Medication Sig Dispense Refill   metoprolol succinate (TOPROL XL) 25 MG 24 hr tablet Take 0.5 tablets (12.5 mg total) by mouth daily. 45 tablet 3   rivaroxaban (XARELTO) 20 MG TABS tablet TAKE 1 TABLET BY MOUTH DAILY  WITH SUPPER 100 tablet 3   simvastatin (ZOCOR) 40 MG tablet Take 1 tablet by mouth  daily at 6 PM. 90 tablet 3     ROS:                                                                                                                                       As per HPI. Detailed ROS deferred in the context of acuity of presentation.   There were no vitals taken for this visit.   General Examination:                                                                                                       Physical Exam  HEENT-  [place]/AT   Lungs- Respirations unlabored Extremities- Warm and well perfused   Neurological Examination Mental Status: Awake and alert. Oriented to the day of the week after pausing to remember, more quickly gives the correct month, year, city and state. No dysarthria. Speech fluent with intact comprehension and naming. Left hemineglect.  Cranial Nerves: II: Left homonymous hemianopsia. PERRL.  III,IV, VI: No ptosis. Right sided gaze preference. Can briefly cross midline to the left.  V: Unaware of being touched on left side of face. Right facial sensation intact.  VII: Mild left  facial droop VIII: Hearing intact to voice IX,X: No hypophonia or hoarseness XI: Head preferentially rotated to the right XII: Midline tongue extension Motor: 5/5 RUE and RLE 4-/5 LUE with dyssinergia/incoordination. Impaired finger dexterity.  4/5 LLE Sensory: Insensate to touch and scratch stimuli, LUE and LLE Deep Tendon Reflexes: 1+ bilateral brachioradialis and patellae. 0 achilles bilaterally. Toes are tonically upgoing.  Cerebellar: Unable to perform FNF accurately on the left.  Gait: Unable to assess  NIHSS: 10   Lab Results: Basic Metabolic Panel: Recent Labs  Lab 05/10/22 0010  NA 139  K 3.7  CL 103  GLUCOSE 157*  BUN 25*  CREATININE 1.30*    CBC: Recent Labs  Lab 05/10/22 0005 05/10/22 0010  WBC 9.2  --   NEUTROABS 6.6  --   HGB 16.7 16.7  HCT 49.6 49.0  MCV 98.4  --   PLT 141*  --     Cardiac Enzymes: No results for input(s): "CKTOTAL", "CKMB", "CKMBINDEX", "TROPONINI" in the last 168 hours.  Lipid Panel: No results for input(s): "CHOL", "TRIG", "HDL", "CHOLHDL", "VLDL", "LDLCALC" in the last 168 hours.  Imaging: No results found.   Assessment: 89 year old male with atrial fibrillation (on Xarelto), prior stroke, presenting with acute onset of left hemiplegia and left hemineglect after sustaining a fall to the left while at home this evening.  - Exam reveals findings best referable to the right MCA territory, most likely secondary to acute ischemic stroke.  - NIHSS 10 - CT head: No acute abnormality. ASPECTS 10 - CTA of head and neck: Right M2 inferior division occlusion - Out of the TNK time window. - The patient is a VIR candidate. Risks/benefits of the procedure were discussed extensively with the patient's family, including approximately 50% chance of significant improvement relative to an approximate 10% chance of subarachnoid hemorrhage with possibility of significant worsening including death. The patient's anosognosia and left hemineglect  preclude meaningful medical decision making on his part at this time The patient's wife expressed understanding and provided informed consent to proceed with VIR. All questions answered. Consent form signed.  - There was significant delay in the patient being sent for thrombectomy due to family taking considerable time to arrive at a decision on whether or not to consent to treatment.   Recommendations: - Admitting to Neuro ICU following VIR.  - Post-VIR order set to include frequent neuro checks and BP management.  - No antiplatelet medications or anticoagulants for at least 24 hours following VIR - Resume Xarelto if repeat imaging 24 hours post-VIR reveals no hemorrhagic conversion - DVT prophylaxis with SCDs.  - Continue simvastatin.  - TTE.  - MRI brain  - PT/OT/Speech.  - NPO until passes swallow evaluation.  - Telemetry monitoring - Fasting lipid panel, HgbA1c    Electronically signed: Dr. Kerney Elbe 05/10/2022, 12:15 AM

## 2022-05-10 NOTE — ED Notes (Signed)
Pt to RESUS from CT scanner at this time, remains on cardiac monitoring device, Dr. Cheral Marker at bedside

## 2022-05-10 NOTE — ED Notes (Signed)
Pt needed to use urinal upon arrival to CT scanner prior being moved from EMS stretcher to CT table, pt assisted with a urinal

## 2022-05-10 NOTE — Anesthesia Preprocedure Evaluation (Signed)
Anesthesia Evaluation  Patient identified by MRN, date of birth, ID band Patient awake    Reviewed: Allergy & Precautions, Patient's Chart, lab work & pertinent test results, Unable to perform ROS - Chart review onlyPreop documentation limited or incomplete due to emergent nature of procedure.  Airway Mallampati: I  TM Distance: >3 FB Neck ROM: Full    Dental  (+) Edentulous Upper, Edentulous Lower   Pulmonary former smoker   breath sounds clear to auscultation       Cardiovascular negative cardio ROS  Rhythm:Irregular Rate:Tachycardia     Neuro/Psych CVA, Residual Symptoms    GI/Hepatic Neg liver ROS,,,  Endo/Other    Renal/GU negative Renal ROS     Musculoskeletal   Abdominal   Peds  Hematology   Anesthesia Other Findings   Reproductive/Obstetrics                             Anesthesia Physical Anesthesia Plan  ASA: 4 and emergent  Anesthesia Plan: General   Post-op Pain Management: Minimal or no pain anticipated   Induction: Intravenous  PONV Risk Score and Plan: 2 and Ondansetron and Treatment may vary due to age or medical condition  Airway Management Planned: Oral ETT  Additional Equipment: Arterial line  Intra-op Plan:   Post-operative Plan: Possible Post-op intubation/ventilation  Informed Consent:      Only emergency history available and History available from chart only  Plan Discussed with: CRNA  Anesthesia Plan Comments: (Lab Results      Component                Value               Date                      WBC                      9.2                 05/10/2022                HGB                      16.7                05/10/2022                HCT                      49.0                05/10/2022                MCV                      98.4                05/10/2022                PLT                      141 (L)             05/10/2022           )        Anesthesia Quick Evaluation

## 2022-05-10 NOTE — ED Notes (Signed)
PT IS NOT A CANDIDATE FOR TNK PT OUTSIDE WINDOW PT IS ON [employer] Reasons

## 2022-05-10 NOTE — Transfer of Care (Signed)
Immediate Anesthesia Transfer of Care Note  Patient: Edward Maynard  Procedure(s) Performed: IR WITH ANESTHESIA  Patient Location: ICU  Anesthesia Type:General  Level of Consciousness: awake, alert , and oriented.  Initially uncooperative after anesthesia but now alert and oriented.  Airway & Oxygen Therapy: Patient Spontanous Breathing and Patient connected to face mask oxygen  Post-op Assessment: Report given to RN and Post -op Vital signs reviewed and stable  Post vital signs: Reviewed and stable  Last Vitals:  Vitals Value Taken Time  BP    Temp    Pulse    Resp    SpO2      Last Pain:  Vitals:   05/10/22 0029  TempSrc: Oral  PainSc:          Complications: No notable events documented.

## 2022-05-10 NOTE — Progress Notes (Signed)
Referring Physician(s): Code Stroke  Supervising Physician: Luanne Bras  Patient Status:  [facility] - In-pt  Chief Complaint: R MCA infarct  Subjective: Resting in bed, remains on precautions and is lying flat.  He has left-sided facial droop. He is alert, oriented, conversant.  Slight dysarthria, but no aphasia. Follows commands.  Able to lift LLE off the bed.  Unable to move left hand/arm.  Right gaze preference.  Allergies: Patient has no known allergies.  Medications: Prior to Admission medications   Medication Sig Start Date End Date Taking? Authorizing Provider  metoprolol succinate (TOPROL XL) 25 MG 24 hr tablet Take 0.5 tablets (12.5 mg total) by mouth daily. 09/12/21  Yes Allred, Jeneen Rinks, MD  rivaroxaban (XARELTO) 20 MG TABS tablet TAKE 1 TABLET BY MOUTH DAILY  WITH SUPPER Patient taking differently: Take 20 mg by mouth daily with supper. 11/01/21  Yes Allred, Jeneen Rinks, MD  simvastatin (ZOCOR) 40 MG tablet Take 1 tablet by mouth  daily at 6 PM. 12/28/16  Yes Lynnell Jude, [employer] K, NP     Vital Signs: BP (!) 160/90 Comment: With movement  Pulse 83   Temp (!) 97.5 F (36.4 C) (Oral)   Resp 17   Ht 5' 11.5" (1.816 m)   Wt 223 lb 1.7 oz (101.2 kg)   SpO2 91%   BMI 30.68 kg/m   Physical Exam NAD, alert Neuro:  alert, oriented, follows commands.  Pupils reactive, R > L.  Right gaze preference.  Dysarthria without aphasia. Able to turn head to the left.  LUE weakness with no movement noted.  Able to lift LLE off bed, poor sensation in feet.   Imaging: ECHOCARDIOGRAM COMPLETE  Result Date: 05/10/2022    ECHOCARDIOGRAM REPORT   Patient Name:   Edward Maynard Date of Exam: 05/10/2022 Medical Rec #:  [id number]     Height:       71.5 in Accession #:    [id number]    Weight:       223.1 lb Date of Birth:  05/10/1933    BSA:          2.220 m Patient Age:    89 years      BP:           160/90 mmHg Patient Gender: M             HR:           73 bpm. Exam Location:  Inpatient  Procedure: 2D Echo, Cardiac Doppler and Color Doppler Indications:    Stroke I63.9  History:        Patient has prior history of Echocardiogram examinations and                 Patient has no prior history of Echocardiogram examinations.                 Pacemaker, Stroke, Arrythmias:PVC and Tachycardia; Risk                 Factors:Dyslipidemia.  Sonographer:    Ronny Flurry Referring Phys: [phone] ERIC LINDZEN  Sonographer Comments: Image acquisition challenging due to respiratory motion. IMPRESSIONS  1. Left ventricular ejection fraction, by estimation, is 60 to 65%. The left ventricle has normal function. The left ventricle has no regional wall motion abnormalities. Left ventricular diastolic parameters are consistent with Grade II diastolic dysfunction (pseudonormalization).  2. Right ventricular systolic function is normal. The right ventricular size is normal. There is normal pulmonary artery systolic pressure.  3. The mitral valve is normal in structure. No evidence of mitral valve regurgitation. No evidence of mitral stenosis.  4. The aortic valve is calcified. Aortic valve regurgitation is trivial. Mild aortic valve stenosis. Aortic valve area, by VTI measures 1.55 cm. Aortic valve mean gradient measures 19.0 mmHg. Aortic valve Vmax measures 2.94 m/s.  5. The inferior vena cava is normal in size with greater than 50% respiratory variability, suggesting right atrial pressure of 3 mmHg. FINDINGS  Left Ventricle: Left ventricular ejection fraction, by estimation, is 60 to 65%. The left ventricle has normal function. The left ventricle has no regional wall motion abnormalities. The left ventricular internal cavity size was normal in size. There is  no left ventricular hypertrophy. Left ventricular diastolic parameters are consistent with Grade II diastolic dysfunction (pseudonormalization). Indeterminate filling pressures. Right Ventricle: The right ventricular size is normal. No increase in right ventricular  wall thickness. Right ventricular systolic function is normal. There is normal pulmonary artery systolic pressure. The tricuspid regurgitant velocity is 1.45 m/s, and  with an assumed right atrial pressure of 3 mmHg, the estimated right ventricular systolic pressure is [id number] mmHg. Left Atrium: Left atrial size was normal in size. Right Atrium: Right atrial size was normal in size. Pericardium: There is no evidence of pericardial effusion. Mitral Valve: The mitral valve is normal in structure. No evidence of mitral valve regurgitation. No evidence of mitral valve stenosis. Tricuspid Valve: The tricuspid valve is normal in structure. Tricuspid valve regurgitation is trivial. No evidence of tricuspid stenosis. Aortic Valve: The aortic valve is calcified. Aortic valve regurgitation is trivial. Mild aortic stenosis is present. Aortic valve mean gradient measures 19.0 mmHg. Aortic valve peak gradient measures 34.5 mmHg. Aortic valve area, by VTI measures 1.55 cm. Pulmonic Valve: The pulmonic valve was normal in structure. Pulmonic valve regurgitation is not visualized. No evidence of pulmonic stenosis. Aorta: The aortic root is normal in size and structure. Venous: The inferior vena cava is normal in size with greater than 50% respiratory variability, suggesting right atrial pressure of 3 mmHg. IAS/Shunts: No atrial level shunt detected by color flow Doppler.  LEFT VENTRICLE PLAX 2D LVIDd:         4.60 cm   Diastology LVIDs:         3.70 cm   LV e' medial:    7.80 cm/s LV PW:         1.30 cm   LV E/e' medial:  10.4 LV IVS:        1.20 cm   LV e' lateral:   11.60 cm/s LVOT diam:     2.20 cm   LV E/e' lateral: 7.0 LV SV:         89 LV SV Index:   40 LVOT Area:     3.80 cm  RIGHT VENTRICLE             IVC RV S prime:     16.60 cm/s  IVC diam: 2.10 cm TAPSE (M-mode): 1.5 cm LEFT ATRIUM             Index        RIGHT ATRIUM           Index LA diam:        4.60 cm 2.07 cm/m   RA Area:     15.10 cm LA Vol (A2C):   64.7 ml  29.14 ml/m  RA Volume:   31.80 ml  14.32 ml/m LA Vol (A4C):   58.7 ml  26.44 ml/m LA Biplane Vol: 62.9 ml 28.33 ml/m  AORTIC VALVE AV Area (Vmax):    1.61 cm AV Area (Vmean):   1.68 cm AV Area (VTI):     1.55 cm AV Vmax:           293.50 cm/s AV Vmean:          189.800 cm/s AV VTI:            0.570 m AV Peak Grad:      34.5 mmHg AV Mean Grad:      19.0 mmHg LVOT Vmax:         124.00 cm/s LVOT Vmean:        83.833 cm/s LVOT VTI:          0.233 m LVOT/AV VTI ratio: 0.41  AORTA Ao Root diam: 3.30 cm MITRAL VALVE               TRICUSPID VALVE MV Area (PHT): 4.68 cm    TR Peak grad:   8.4 mmHg MV Decel Time: 162 msec    TR Vmax:        145.00 cm/s MV E velocity: 81.00 cm/s MV A velocity: 65.50 cm/s  SHUNTS MV E/A ratio:  1.24        Systemic VTI:  0.23 m                            Systemic Diam: 2.20 cm Skeet Latch MD Electronically signed by Skeet Latch MD Signature Date/Time: 05/10/2022/10:48:39 AM    Final    CT HEAD CODE STROKE WO CONTRAST  Result Date: 05/10/2022 CLINICAL DATA:  Acute neurologic deficit EXAM: CT HEAD WITHOUT CONTRAST CT CERVICAL SPINE WITHOUT CONTRAST TECHNIQUE: Multidetector CT imaging of the head and cervical spine was performed following the standard protocol without intravenous contrast. Multiplanar CT image reconstructions of the cervical spine were also generated. RADIATION DOSE REDUCTION: This exam was performed according to the departmental dose-optimization program which includes automated exposure control, adjustment of the mA and/or kV according to patient size and/or use of iterative reconstruction technique. COMPARISON:  None Available. FINDINGS: CT HEAD FINDINGS Brain: There is no mass, hemorrhage or extra-axial collection. Generalized atrophy. Multiple old small vessel infarcts of the cerebellum. There is hypoattenuation of the periventricular white matter, most commonly indicating chronic ischemic microangiopathy. Old right frontal and parietal infarcts. Vascular:  No abnormal hyperdensity of the major intracranial arteries or dural venous sinuses. No intracranial atherosclerosis. Skull: The visualized skull base, calvarium and extracranial soft tissues are normal. Sinuses/Orbits: No fluid levels or advanced mucosal thickening of the visualized paranasal sinuses. No mastoid or middle ear effusion. The orbits are normal. ASPECTS ([place] Stroke Program Early CT Score) - Ganglionic level infarction (caudate, lentiform nuclei, internal capsule, insula, M1-M3 cortex): 7 - Supraganglionic infarction (M4-M6 cortex): 3 Total score (0-10 with 10 being normal): 10 CT CERVICAL SPINE FINDINGS Alignment: No static subluxation. Facets are aligned. Occipital condyles and the lateral masses of C1 and C2 are normally approximated. Skull base and vertebrae: No acute fracture. Soft tissues and spinal canal: No prevertebral fluid or swelling. No visible canal hematoma. Disc levels: No advanced spinal canal or neural foraminal stenosis. Upper chest: No pneumothorax, pulmonary nodule or pleural effusion. Other: Normal visualized paraspinal cervical soft tissues. IMPRESSION: 1. No acute intracranial abnormality. 2. ASPECTS is 10. 3. No acute fracture or static subluxation of the cervical spine. These results were communicated to Dr. Kerney Elbe  at 12:24 am on 05/10/2022 by text page via the [facility] messaging system. Electronically Signed   By: Ulyses Jarred M.D.   On: 05/10/2022 00:47   CT C-SPINE NO CHARGE  Result Date: 05/10/2022 CLINICAL DATA:  Acute neurologic deficit EXAM: CT HEAD WITHOUT CONTRAST CT CERVICAL SPINE WITHOUT CONTRAST TECHNIQUE: Multidetector CT imaging of the head and cervical spine was performed following the standard protocol without intravenous contrast. Multiplanar CT image reconstructions of the cervical spine were also generated. RADIATION DOSE REDUCTION: This exam was performed according to the departmental dose-optimization program which includes automated exposure  control, adjustment of the mA and/or kV according to patient size and/or use of iterative reconstruction technique. COMPARISON:  None Available. FINDINGS: CT HEAD FINDINGS Brain: There is no mass, hemorrhage or extra-axial collection. Generalized atrophy. Multiple old small vessel infarcts of the cerebellum. There is hypoattenuation of the periventricular white matter, most commonly indicating chronic ischemic microangiopathy. Old right frontal and parietal infarcts. Vascular: No abnormal hyperdensity of the major intracranial arteries or dural venous sinuses. No intracranial atherosclerosis. Skull: The visualized skull base, calvarium and extracranial soft tissues are normal. Sinuses/Orbits: No fluid levels or advanced mucosal thickening of the visualized paranasal sinuses. No mastoid or middle ear effusion. The orbits are normal. ASPECTS ([place] Stroke Program Early CT Score) - Ganglionic level infarction (caudate, lentiform nuclei, internal capsule, insula, M1-M3 cortex): 7 - Supraganglionic infarction (M4-M6 cortex): 3 Total score (0-10 with 10 being normal): 10 CT CERVICAL SPINE FINDINGS Alignment: No static subluxation. Facets are aligned. Occipital condyles and the lateral masses of C1 and C2 are normally approximated. Skull base and vertebrae: No acute fracture. Soft tissues and spinal canal: No prevertebral fluid or swelling. No visible canal hematoma. Disc levels: No advanced spinal canal or neural foraminal stenosis. Upper chest: No pneumothorax, pulmonary nodule or pleural effusion. Other: Normal visualized paraspinal cervical soft tissues. IMPRESSION: 1. No acute intracranial abnormality. 2. ASPECTS is 10. 3. No acute fracture or static subluxation of the cervical spine. These results were communicated to Dr. Kerney Elbe at 12:24 am on 05/10/2022 by text page via the [facility] messaging system. Electronically Signed   By: Ulyses Jarred M.D.   On: 05/10/2022 00:47   CT ANGIO HEAD NECK W WO CM (CODE  STROKE)  Result Date: 05/10/2022 CLINICAL DATA:  Acute neurologic deficit EXAM: CT ANGIOGRAPHY HEAD AND NECK TECHNIQUE: Multidetector CT imaging of the head and neck was performed using the standard protocol during bolus administration of intravenous contrast. Multiplanar CT image reconstructions and MIPs were obtained to evaluate the vascular anatomy. Carotid stenosis measurements (when applicable) are obtained utilizing NASCET criteria, using the distal internal carotid diameter as the denominator. RADIATION DOSE REDUCTION: This exam was performed according to the departmental dose-optimization program which includes automated exposure control, adjustment of the mA and/or kV according to patient size and/or use of iterative reconstruction technique. COMPARISON:  None Available. FINDINGS: CTA NECK FINDINGS SKELETON: There is no bony spinal canal stenosis. No lytic or blastic lesion. OTHER NECK: 12 mm right parotid nodule (series 7 image 81). UPPER CHEST: No pneumothorax or pleural effusion. No nodules or masses. AORTIC ARCH: There is calcific atherosclerosis of the aortic arch. There is no aneurysm, dissection or hemodynamically significant stenosis of the visualized portion of the aorta. Normal variant aortic arch branching pattern with the left vertebral artery arising independently from the aortic arch. The visualized proximal subclavian arteries are widely patent. RIGHT CAROTID SYSTEM: Atherosclerotic web at the bifurcation. Mixed density plaque with less than  50% stenosis. Widely patent ICA. LEFT CAROTID SYSTEM: Calcific atherosclerosis at the bifurcation without hemodynamically significant stenosis. VERTEBRAL ARTERIES: Right dominant configuration. Both origins are clearly patent. There is no dissection, occlusion or flow-limiting stenosis to the skull base (V1-V3 segments). CTA HEAD FINDINGS POSTERIOR CIRCULATION: --Vertebral arteries: Right V4 segment atherosclerosis with widely maintained patency.  --Inferior cerebellar arteries: Normal. --Basilar artery: Normal. --Superior cerebellar arteries: Normal. --Posterior cerebral arteries (PCA): Normal. ANTERIOR CIRCULATION: --Intracranial internal carotid arteries: Atherosclerotic calcification of the internal carotid arteries at the skull base without hemodynamically significant stenosis. --Anterior cerebral arteries (ACA): Normal. Both A1 segments are present. Patent anterior communicating artery (a-comm). --Middle cerebral arteries (MCA): There is occlusion of the proximal M2 segment inferior division of the right MCA (series 10, image 114). Left MCA is normal. VENOUS SINUSES: As permitted by contrast timing, patent. ANATOMIC VARIANTS: Fetal origin of the right posterior cerebral artery. Review of the MIP images confirms the above findings. IMPRESSION: 1. Occlusion of the proximal M2 segment inferior division of the right MCA. 2. 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 3. Nonspecific 12 mm right parotid nodule. 4. Critical Value/emergent results were called by telephone at the time of interpretation on 05/10/2022 at 12:38 am to provider ERIC [facility] , who verbally acknowledged these results. By Electronically Signed   By: Ulyses Jarred M.D.   On: 05/10/2022 00:39    Labs:  CBC: Recent Labs    09/12/21 1539 05/10/22 0005 05/10/22 0010 05/10/22 0545  WBC 6.1 9.2  --  11.6*  HGB 15.4 16.7 16.7 16.0  HCT 45.0 49.6 49.0 46.7  PLT 160 141*  --  164    COAGS: Recent Labs    05/10/22 0005  INR 2.2*  APTT 39*    BMP: Recent Labs    09/12/21 1539 05/10/22 0005 05/10/22 0010 05/10/22 0545  NA 142 137 139 135  K 4.0 3.5 3.7 3.8  CL 103 103 103 103  CO2 24 21*  --  22  GLUCOSE 104* 158* 157* 183*  BUN 20 20 25* 19  CALCIUM 9.1 9.2  --  8.6*  CREATININE 1.14 1.33* 1.30* 1.43*  GFRNONAA  --  52*  --  48*    LIVER FUNCTION TESTS: Recent Labs    05/10/22 0005  BILITOT 0.4  AST 27  ALT 20   ALKPHOS 93  PROT 7.0  ALBUMIN 4.1    Assessment and Plan: R MCA infarct due to right MCA infarct s/p thrombectomy at M2 with distal migration and only partial thrombus removal achieving TICI2c revascularization Patient with ongoing noted deficits today on the left.   Plans to proceed with MRI/MRA today.  Dr. Estanislado Pandy has discussed with Dr. Erlinda Hong.  IR will follow for now.   Electronically Signed: Docia Barrier, PA 05/10/2022, 11:45 AM   I spent a total of 15 Minutes at the the patient's bedside AND on the patient's hospital floor or unit, greater than 50% of which was counseling/coordinating care for R MCA occlusion.

## 2022-05-10 NOTE — ED Notes (Signed)
Pt to IR at this time with this RN, pt remains on cardiac monitoring

## 2022-05-10 NOTE — Evaluation (Signed)
Clinical/Bedside Swallow Evaluation Patient Details  Name: Edward Maynard MRN: [id number] Date of Birth: [birth year]  Today's Date: 05/10/2022 Time: SLP Start Time (ACUTE ONLY): 1340 SLP Stop Time (ACUTE ONLY): 1430 SLP Time Calculation (min) (ACUTE ONLY): 50 min  Past Medical History:  Past Medical History:  Diagnosis Date   High cholesterol    05/2013; no defecits   Hyperlipemia    Left arm weakness    Left hand weakness    Personal history of colonic polyp - adenoma  10/23/2013   Stroke ([place]) 05/2013   MRI on 07/02/13 = Multifocal acute & subacute infarction involving right frontal MCA/ACA & left parietal MCA/PCA watershed areas   Past Surgical History:  Past Surgical History:  Procedure Laterality Date   LOOP RECORDER IMPLANT N/A 10/31/2013   Procedure: LOOP RECORDER IMPLANT;  Surgeon: Coralyn Mark, MD;  Location: [facility] CATH LAB;  Service: Cardiovascular;  Laterality: N/A;   SHOULDER SURGERY Bilateral 2003, [id number]   HPI:  89 year old male with atrial fibrillation (on Xarelto), prior stroke, presenting with acute onset of left hemiplegia and left hemineglect after sustaining a fall to the left while at home this evening. Underwent thrombectomy    Assessment / Plan / Recommendation  Clinical Impression  Pt demonstrates moderate oral dysphagia with left CN V/VII sensory motor impairment. He has anterior spilalge on the left with liquids, but no overt pocketing with puree trials. If allowed to sip more than a very small amount pt has immediate coughing suggestive of sensed aspiration. MBS recommended for instrumental assessment of swallowing. Prognosis for diet initation tomorrow is good. For now pt may consume purees with pills or ice chips in moderation. SLP Visit Diagnosis: Dysphagia, oropharyngeal phase (R13.12)    Aspiration Risk  Moderate aspiration risk    Diet Recommendation NPO;NPO except meds;Ice chips PRN after oral care        Other  Recommendations      Recommendations  for follow up therapy are one component of a multi-disciplinary discharge planning process, led by the attending physician.  Recommendations may be updated based on patient status, additional functional criteria and insurance authorization.  Follow up Recommendations        Assistance Recommended at Discharge    Functional Status Assessment    Frequency and Duration            Prognosis        Swallow Study   General HPI: 89 year old male with atrial fibrillation (on Xarelto), prior stroke, presenting with acute onset of left hemiplegia and left hemineglect after sustaining a fall to the left while at home this evening. Underwent thrombectomy Type of Study: Bedside Swallow Evaluation Diet Prior to this Study: NPO Temperature Spikes Noted: No Respiratory Status: Room air History of Recent Intubation: No Behavior/Cognition: Alert;Cooperative;Pleasant mood Oral Cavity Assessment: Within Functional Limits Oral Care Completed by SLP: No Oral Cavity - Dentition: Dentures, top;Dentures, bottom Vision: Impaired for self-feeding Self-Feeding Abilities: Needs assist Patient Positioning: Upright in chair Baseline Vocal Quality: Hoarse Volitional Cough: Strong Volitional Swallow: Able to elicit    Oral/Motor/Sensory Function Overall Oral Motor/Sensory Function: Moderate impairment Facial ROM: Reduced left;Suspected CN VII (facial) dysfunction Facial Symmetry: Abnormal symmetry left;Suspected CN VII (facial) dysfunction Facial Strength: Reduced left;Suspected CN VII (facial) dysfunction Facial Sensation: Reduced left;Suspected CN V (Trigeminal) dysfunction Lingual ROM: Within Functional Limits Lingual Symmetry: Within Functional Limits Lingual Strength: Within Functional Limits Lingual Sensation: Within Functional Limits Velum: Within Functional Limits Mandible: Within Functional Limits  Ice Chips Ice chips: Within functional limits Presentation: Spoon   Thin Liquid Thin Liquid:  Impaired Presentation: Cup Oral Phase Impairments: Reduced labial seal Oral Phase Functional Implications: Left anterior spillage Pharyngeal  Phase Impairments: Cough - Immediate    Nectar Thick Nectar Thick Liquid: Not tested   Honey Thick Honey Thick Liquid: Not tested   Puree Puree: Impaired Presentation: Spoon Oral Phase Impairments: Reduced labial seal Oral Phase Functional Implications: Left anterior spillage   Solid     Solid: Not tested      Glenda Spelman, Katherene Ponto 05/10/2022,2:58 PM

## 2022-05-10 NOTE — Procedures (Signed)
INR. Status post right common carotid arteriogram.    Right CFA approach.  Findings.  Occluded distal M2 segment of the inferior division of the right middle cerebral artery.  Status post partial recanalization of the occluded distal M2's segment of the inferior division right MCA with 2 passes with contact aspiration ,and 3 mm x 20 mm Solitaire X retrieval device, 1 pass with contact aspiration and 4 x 40 mm Solitaire X retrieval device and 1 pass with contact aspiration achieving mild improved revascularization maintaining a TICI 2C revascularization.  Post CT brain no evidence of intracranial hemorrhage.  8 French Angio-Seal closure device deployed for hemostasis at the right groin puncture site.  Distal pulses all dopplerable in both feet unchanged.  Extubated.  Agitated.  Moves right upper and lower extremities and left lower extremity spontaneously.  Minimal movement of the left upper extremity.  Patient beginning to follow simple commands appropriately.  Pupils 2 to 3 mm bilaterally   No facial asymmetry.  Tongue midline.  Arlean Hopping MD.

## 2022-05-10 NOTE — ED Notes (Signed)
Pt's daughter at the bedside, Dr. Cheral Marker at the bedside to consult daughter about possible IR

## 2022-05-10 NOTE — ED Provider Notes (Signed)
[facility] Provider Note   CSN: [id number] Arrival date & time: 05/10/22  0001  An emergency department physician performed an initial assessment on this suspected stroke patient at [facility].  History  Chief Complaint  Patient presents with   Code Stroke    Edward Maynard is a 89 y.o. male.  Patient patient brought to the ER by ambulance from home.  Patient with new onset left-sided weakness.  Last known normal was 1845.  Patient reportedly got up and tried to ambulate, fell to the ground.  He could not get up, EMS was called.  Patient has significant left-sided deficits on exam which are new.       Home Medications Prior to Admission medications   Medication Sig Start Date End Date Taking? Authorizing Provider  metoprolol succinate (TOPROL XL) 25 MG 24 hr tablet Take 0.5 tablets (12.5 mg total) by mouth daily. 09/12/21   Allred, Jeneen Rinks, MD  rivaroxaban (XARELTO) 20 MG TABS tablet TAKE 1 TABLET BY MOUTH DAILY  WITH SUPPER 11/01/21   Allred, Jeneen Rinks, MD  simvastatin (ZOCOR) 40 MG tablet Take 1 tablet by mouth  daily at 6 PM. 12/28/16   Patsey Berthold, NP      Allergies    Patient has no known allergies.    Review of Systems   Review of Systems  Physical Exam Updated Vital Signs BP (!) 174/91   Pulse (!) 118   Temp 98.2 F (36.8 C) (Oral)   Resp (!) 22   Ht 5' 11.5" (1.816 m)   Wt 101.2 kg   SpO2 98%   BMI 30.67 kg/m  Physical Exam Vitals and nursing note reviewed.  Constitutional:      General: He is not in acute distress.    Appearance: He is well-developed.  HENT:     Head: Normocephalic and atraumatic.     Mouth/Throat:     Mouth: Mucous membranes are moist.  Eyes:     General: Vision grossly intact. Gaze aligned appropriately.     Extraocular Movements: Extraocular movements intact.     Conjunctiva/sclera: Conjunctivae normal.  Cardiovascular:     Rate and Rhythm: Normal rate and regular rhythm.     Pulses: Normal  pulses.     Heart sounds: Normal heart sounds, S1 normal and S2 normal. No murmur heard.    No friction rub. No gallop.  Pulmonary:     Effort: Pulmonary effort is normal. No respiratory distress.     Breath sounds: Normal breath sounds.  Abdominal:     Palpations: Abdomen is soft.     Tenderness: There is no abdominal tenderness. There is no guarding or rebound.     Hernia: No hernia is present.  Musculoskeletal:        General: No swelling.     Cervical back: Full passive range of motion without pain, normal range of motion and neck supple. No pain with movement, spinous process tenderness or muscular tenderness. Normal range of motion.     Right lower leg: No edema.     Left lower leg: No edema.  Skin:    General: Skin is warm and dry.     Capillary Refill: Capillary refill takes less than 2 seconds.     Findings: Abrasion (Left side of abdomen/flank) present. No ecchymosis, erythema, lesion or wound.  Neurological:     Mental Status: He is alert and oriented to person, place, and time.     GCS: GCS  eye subscore is 4. GCS verbal subscore is 5. GCS motor subscore is 6.     Cranial Nerves: Facial asymmetry present.     Sensory: Sensory deficit present.     Motor: Weakness present.     Coordination: Coordination abnormal. Finger-Nose-Finger Test abnormal and Heel to [facility] Test abnormal. Impaired rapid alternating movements.  Psychiatric:        Mood and Affect: Mood normal.        Speech: Speech normal.        Behavior: Behavior normal.     ED Results / Procedures / Treatments   Labs (all labs ordered are listed, but only abnormal results are displayed) Labs Reviewed  PROTIME-INR - Abnormal; Notable for the following components:      Result Value   Prothrombin Time 24.4 (*)    INR 2.2 (*)    All other components within normal limits  APTT - Abnormal; Notable for the following components:   aPTT 39 (*)    All other components within normal limits  CBC - Abnormal; Notable  for the following components:   Platelets 141 (*)    All other components within normal limits  COMPREHENSIVE METABOLIC PANEL - Abnormal; Notable for the following components:   CO2 21 (*)    Glucose, Bld 158 (*)    Creatinine, Ser 1.33 (*)    GFR, Estimated 52 (*)    All other components within normal limits  I-STAT CHEM 8, ED - Abnormal; Notable for the following components:   BUN 25 (*)    Creatinine, Ser 1.30 (*)    Glucose, Bld 157 (*)    Calcium, Ion 1.06 (*)    All other components within normal limits  CBG MONITORING, ED - Abnormal; Notable for the following components:   Glucose-Capillary 135 (*)    All other components within normal limits  DIFFERENTIAL  ETHANOL    EKG EKG Interpretation  Date/Time:  Wednesday May 10 2022 01:01:37 EST Ventricular Rate:  122 PR Interval:    QRS Duration: 94 QT Interval:  352 QTC Calculation: 504 R Axis:   31 Text Interpretation: Atrial fibrillation Borderline ST depression, anterolateral leads Prolonged QT interval since last tracing, afib now present Confirmed by Orpah Greek [phone]) on 05/10/2022 1:06:28 AM  Radiology CT HEAD CODE STROKE WO CONTRAST  Result Date: 05/10/2022 CLINICAL DATA:  Acute neurologic deficit EXAM: CT HEAD WITHOUT CONTRAST CT CERVICAL SPINE WITHOUT CONTRAST TECHNIQUE: Multidetector CT imaging of the head and cervical spine was performed following the standard protocol without intravenous contrast. Multiplanar CT image reconstructions of the cervical spine were also generated. RADIATION DOSE REDUCTION: This exam was performed according to the departmental dose-optimization program which includes automated exposure control, adjustment of the mA and/or kV according to patient size and/or use of iterative reconstruction technique. COMPARISON:  None Available. FINDINGS: CT HEAD FINDINGS Brain: There is no mass, hemorrhage or extra-axial collection. Generalized atrophy. Multiple old small vessel infarcts  of the cerebellum. There is hypoattenuation of the periventricular white matter, most commonly indicating chronic ischemic microangiopathy. Old right frontal and parietal infarcts. Vascular: No abnormal hyperdensity of the major intracranial arteries or dural venous sinuses. No intracranial atherosclerosis. Skull: The visualized skull base, calvarium and extracranial soft tissues are normal. Sinuses/Orbits: No fluid levels or advanced mucosal thickening of the visualized paranasal sinuses. No mastoid or middle ear effusion. The orbits are normal. ASPECTS [facility] Stroke Program Early CT Score) - Ganglionic level infarction (caudate, lentiform nuclei, internal capsule, insula, M1-M3  cortex): 7 - Supraganglionic infarction (M4-M6 cortex): 3 Total score (0-10 with 10 being normal): 10 CT CERVICAL SPINE FINDINGS Alignment: No static subluxation. Facets are aligned. Occipital condyles and the lateral masses of C1 and C2 are normally approximated. Skull base and vertebrae: No acute fracture. Soft tissues and spinal canal: No prevertebral fluid or swelling. No visible canal hematoma. Disc levels: No advanced spinal canal or neural foraminal stenosis. Upper chest: No pneumothorax, pulmonary nodule or pleural effusion. Other: Normal visualized paraspinal cervical soft tissues. IMPRESSION: 1. No acute intracranial abnormality. 2. ASPECTS is 10. 3. No acute fracture or static subluxation of the cervical spine. These results were communicated to Dr. Kerney Elbe at 12:24 am on 05/10/2022 by text page via the [facility] messaging system. Electronically Signed   By: Ulyses Jarred M.D.   On: 05/10/2022 00:47   CT C-SPINE NO CHARGE  Result Date: 05/10/2022 CLINICAL DATA:  Acute neurologic deficit EXAM: CT HEAD WITHOUT CONTRAST CT CERVICAL SPINE WITHOUT CONTRAST TECHNIQUE: Multidetector CT imaging of the head and cervical spine was performed following the standard protocol without intravenous contrast. Multiplanar CT image  reconstructions of the cervical spine were also generated. RADIATION DOSE REDUCTION: This exam was performed according to the departmental dose-optimization program which includes automated exposure control, adjustment of the mA and/or kV according to patient size and/or use of iterative reconstruction technique. COMPARISON:  None Available. FINDINGS: CT HEAD FINDINGS Brain: There is no mass, hemorrhage or extra-axial collection. Generalized atrophy. Multiple old small vessel infarcts of the cerebellum. There is hypoattenuation of the periventricular white matter, most commonly indicating chronic ischemic microangiopathy. Old right frontal and parietal infarcts. Vascular: No abnormal hyperdensity of the major intracranial arteries or dural venous sinuses. No intracranial atherosclerosis. Skull: The visualized skull base, calvarium and extracranial soft tissues are normal. Sinuses/Orbits: No fluid levels or advanced mucosal thickening of the visualized paranasal sinuses. No mastoid or middle ear effusion. The orbits are normal. ASPECTS ([facility] Stroke Program Early CT Score) - Ganglionic level infarction (caudate, lentiform nuclei, internal capsule, insula, M1-M3 cortex): 7 - Supraganglionic infarction (M4-M6 cortex): 3 Total score (0-10 with 10 being normal): 10 CT CERVICAL SPINE FINDINGS Alignment: No static subluxation. Facets are aligned. Occipital condyles and the lateral masses of C1 and C2 are normally approximated. Skull base and vertebrae: No acute fracture. Soft tissues and spinal canal: No prevertebral fluid or swelling. No visible canal hematoma. Disc levels: No advanced spinal canal or neural foraminal stenosis. Upper chest: No pneumothorax, pulmonary nodule or pleural effusion. Other: Normal visualized paraspinal cervical soft tissues. IMPRESSION: 1. No acute intracranial abnormality. 2. ASPECTS is 10. 3. No acute fracture or static subluxation of the cervical spine. These results were communicated to  Dr. Kerney Elbe at 12:24 am on 05/10/2022 by text page via the [facility] messaging system. Electronically Signed   By: Ulyses Jarred M.D.   On: 05/10/2022 00:47   CT ANGIO HEAD NECK W WO CM (CODE STROKE)  Result Date: 05/10/2022 CLINICAL DATA:  Acute neurologic deficit EXAM: CT ANGIOGRAPHY HEAD AND NECK TECHNIQUE: Multidetector CT imaging of the head and neck was performed using the standard protocol during bolus administration of intravenous contrast. Multiplanar CT image reconstructions and MIPs were obtained to evaluate the vascular anatomy. Carotid stenosis measurements (when applicable) are obtained utilizing NASCET criteria, using the distal internal carotid diameter as the denominator. RADIATION DOSE REDUCTION: This exam was performed according to the departmental dose-optimization program which includes automated exposure control, adjustment of the mA and/or kV according to  patient size and/or use of iterative reconstruction technique. COMPARISON:  None Available. FINDINGS: CTA NECK FINDINGS SKELETON: There is no bony spinal canal stenosis. No lytic or blastic lesion. OTHER NECK: 12 mm right parotid nodule (series 7 image 81). UPPER CHEST: No pneumothorax or pleural effusion. No nodules or masses. AORTIC ARCH: There is calcific atherosclerosis of the aortic arch. There is no aneurysm, dissection or hemodynamically significant stenosis of the visualized portion of the aorta. Normal variant aortic arch branching pattern with the left vertebral artery arising independently from the aortic arch. The visualized proximal subclavian arteries are widely patent. RIGHT CAROTID SYSTEM: Atherosclerotic web at the bifurcation. Mixed density plaque with less than 50% stenosis. Widely patent ICA. LEFT CAROTID SYSTEM: Calcific atherosclerosis at the bifurcation without hemodynamically significant stenosis. VERTEBRAL ARTERIES: Right dominant configuration. Both origins are clearly patent. There is no dissection, occlusion or  flow-limiting stenosis to the skull base (V1-V3 segments). CTA HEAD FINDINGS POSTERIOR CIRCULATION: --Vertebral arteries: Right V4 segment atherosclerosis with widely maintained patency. --Inferior cerebellar arteries: Normal. --Basilar artery: Normal. --Superior cerebellar arteries: Normal. --Posterior cerebral arteries (PCA): Normal. ANTERIOR CIRCULATION: --Intracranial internal carotid arteries: Atherosclerotic calcification of the internal carotid arteries at the skull base without hemodynamically significant stenosis. --Anterior cerebral arteries (ACA): Normal. Both A1 segments are present. Patent anterior communicating artery (a-comm). --Middle cerebral arteries (MCA): There is occlusion of the proximal M2 segment inferior division of the right MCA (series 10, image 114). Left MCA is normal. VENOUS SINUSES: As permitted by contrast timing, patent. ANATOMIC VARIANTS: Fetal origin of the right posterior cerebral artery. Review of the MIP images confirms the above findings. IMPRESSION: 1. Occlusion of the proximal M2 segment inferior division of the right MCA. 2. 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 3. Nonspecific 12 mm right parotid nodule. 4. Critical Value/emergent results were called by telephone at the time of interpretation on 05/10/2022 at 12:38 am to provider ERIC [facility] , who verbally acknowledged these results. By Electronically Signed   By: Ulyses Jarred M.D.   On: 05/10/2022 00:39    Procedures Procedures    Medications Ordered in ED Medications  sodium chloride flush (NS) 0.9 % injection 3 mL (has no administration in time range)  iohexol (OMNIPAQUE) 350 MG/ML injection 75 mL (75 mLs Intravenous Contrast Given 05/10/22 0032)    ED Course/ Medical Decision Making/ A&P                             Medical Decision Making Amount and/or Complexity of Data Reviewed Independent Historian: EMS External Data Reviewed: ECG and notes. Labs:  ordered. Decision-making details documented in ED Course. Radiology: ordered and independent interpretation performed. Decision-making details documented in ED Course. ECG/medicine tests: ordered and independent interpretation performed. Decision-making details documented in ED Course.   Patient presents to the emergency department for evaluation after a fall.  Patient is unclear about the circumstances.  Upon arrival to the ER, however, he is found to have significant left-sided deficit with left-sided neglect.  This is likely what caused the fall.  Acute stroke is considered likely.  Patient is on Eliquis, must rule out intracranial bleed as well.  Patient arrives as a code stroke, evaluated in conjunction with Dr. Cheral Marker, neurology.  CT head without acute abnormality.  CT cervical spine without fracture.  Patient underwent CT angiography head and neck and it does show an occlusion.  Patient will go to IR and be  admitted by neurology.  CRITICAL CARE Performed by: Orpah Greek   Total critical care time: 30 minutes  Critical care time was exclusive of separately billable procedures and treating other patients.  Critical care was necessary to treat or prevent imminent or life-threatening deterioration.  Critical care was time spent personally by me on the following activities: development of treatment plan with patient and/or surrogate as well as nursing, discussions with consultants, evaluation of patient's response to treatment, examination of patient, obtaining history from patient or surrogate, ordering and performing treatments and interventions, ordering and review of laboratory studies, ordering and review of radiographic studies, pulse oximetry and re-evaluation of patient's condition.         Final Clinical Impression(s) / ED Diagnoses Final diagnoses:  Cerebrovascular accident (CVA) due to occlusion of right middle cerebral artery [facility])    Rx / DC Orders ED  Discharge Orders     None         Varie Machamer, Gwenyth Allegra, MD 05/10/22 [phone]

## 2022-05-10 NOTE — ED Notes (Addendum)
Pt arrived to CT RM 2 on EMS stretcher, pt was weighted on scale while remaining on EMS stretcher prior to entering CT RM 2, weight documented in chart. Dr. Cheral Marker (Neuro) and Dr. Betsey Holiday (ED provider) followed pt to CT scanner.

## 2022-05-10 NOTE — Progress Notes (Signed)
Inpatient Rehab Admissions Coordinator :  Per therapy recommendations, patient was screened for CIR candidacy by Darly Fails RN MSN.  At this time patient appears to be a potential candidate for CIR. I will place a rehab consult per protocol for full assessment. Please call me with any questions.  Ashritha Desrosiers RN MSN Admissions Coordinator [phone]   

## 2022-05-10 NOTE — Evaluation (Signed)
Occupational Therapy Evaluation Patient Details Name: Edward Maynard MRN: [id number] DOB: 1933/05/27 Today's Date: 05/10/2022   History of Present Illness Pt is an 89 y.o. male who presented 05/10/22 with L-sided weakness and a fall. Outside window for tPA. 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 PMH: A-fib on Xarelto, HLD, CVA in 05/2013   Clinical Impression   Pt currently with functional limitations due to the deficits listed below (see OT Problem List). Prior to admit, pt reports that he was independent, driving, and completing all daily activities without the use of an AD.  Pt will benefit from skilled OT to increase their safety and independence with ADL and functional mobility for ADL to facilitate discharge to venue listed below.        Recommendations for follow up therapy are one component of a multi-disciplinary discharge planning process, led by the attending physician.  Recommendations may be updated based on patient status, additional functional criteria and insurance authorization.   Follow Up Recommendations  Acute inpatient rehab (3hours/day)     Assistance Recommended at Discharge Frequent or constant Supervision/Assistance  Patient can return home with the following Two people to help with walking and/or transfers;A lot of help with bathing/dressing/bathroom;Assistance with cooking/housework;Help with stairs or ramp for entrance;Assist for transportation    Functional Status Assessment  Patient has had a recent decline in their functional status and demonstrates the ability to make significant improvements in function in a reasonable and predictable amount of time.  Equipment Recommendations  Other (comment) (TBD)    Recommendations for Other Services Rehab consult     Precautions / Restrictions Precautions Precautions: Fall Precaution Comments: BP goal 120 - 140 within 24 hours of IR;  A-line; Left hemi; Left homonymous hemianopia Restrictions Weight Bearing Restrictions: No      Mobility Bed Mobility Overal bed mobility: Needs Assistance Bed Mobility: Supine to Sit     Supine to sit: Min assist, +2 for physical assistance, HOB elevated, +2 for safety/equipment     General bed mobility comments: Pt able to initiate bringing legs towards and off left EOB once gained pt's attention to turn his head to see therapist and EOB to his left. Pt grasping therapist's hand with his RUE to pull to ascend trunk, minAx2 for trunk ascension and stability due to mild impulsivity, poor safety awareness. and proprioception by pt. Patient Response: Cooperative  Transfers Overall transfer level: Needs assistance Equipment used: 2 person hand held assist Transfers: Sit to/from Stand, Bed to chair/wheelchair/BSC Sit to Stand: Max assist, +2 physical assistance, +2 safety/equipment Stand pivot transfers: Max assist, +2 physical assistance, +2 safety/equipment         General transfer comment: Pt needing repeated multi-modal cues to grasp therapist's upper arm with his R hand and lean his chest anteriorly onto shoulders of therapists, face-to-face approach provided with x2 therapists and knee block to stand from EOB. Pt demonstrating increased Left lateral lean in standing, needing x1 assistance to just maintain balance and x1 assistance to pivot his hips to the Right to transfer from bed to recliner.      Balance Overall balance assessment: Needs assistance Sitting-balance support: Single extremity supported, No upper extremity supported, Feet supported Sitting balance-Leahy Scale: Fair Sitting balance - Comments: Static sitting EOB with min guard-minA for balance, needing cues to reduce L lateral and anterior lean, but once provided cues to find midline he could maintain it for >30 seconds  Postural control: Left lateral lean Standing balance support: Single extremity supported,  Bilateral upper extremity supported, During functional activity Standing balance-Leahy Scale: Poor Standing balance comment: Mod-maxAx2 with L knee block and heavy L lateral lean in standing       ADL either performed or assessed with clinical judgement   ADL Overall ADL's : Needs assistance/impaired Eating/Feeding: NPO   Grooming: Wash/dry hands;Wash/dry face;Oral care;Minimal assistance;Bed level   Upper Body Bathing: Bed level;Maximal assistance   Lower Body Bathing: Total assistance;Bed level   Upper Body Dressing : Maximal assistance;Bed level   Lower Body Dressing: Total assistance;Bed level   Toilet Transfer: Maximal assistance;+2 for physical assistance;Stand-pivot Toilet Transfer Details (indicate cue type and reason): simulated to recliner Toileting- Clothing Manipulation and Hygiene: Total assistance;Bed level         Vision Baseline Vision/History: 0 No visual deficits Ability to See in Adequate Light: 0 Adequate Patient Visual Report: No change from baseline Vision Assessment?: Yes Alignment/Gaze Preference: Head turned;Gaze right Visual Fields: Left homonymous hemianopsia     Perception  Impaired.. left side physical and visual neglect   Praxis Praxis Praxis-Other Comments: Apraxic    Pertinent Vitals/Pain Pain Assessment Pain Assessment: Faces Faces Pain Scale: No hurt     Hand Dominance Right   Extremity/Trunk Assessment Upper Extremity Assessment Upper Extremity Assessment: LUE deficits/detail LUE Deficits / Details: Grossly weak. Unable to detect light touch thoughout entire LUE to half of collar bone. Shoulder flexion: 3-/5, elbow flexion/extension 3/5. decreased gross grasp. LUE Sensation: decreased light touch;decreased proprioception LUE Coordination: decreased gross motor;decreased fine motor   Lower Extremity Assessment Lower Extremity Assessment: Defer to PT evaluation    Cervical / Trunk Assessment Cervical / Trunk Assessment:  Normal   Communication Communication Communication: Expressive difficulties;HOH (poor word articulation)   Cognition Arousal/Alertness: Awake/alert Behavior During Therapy: Restless, Impulsive (midly) Overall Cognitive Status: Impaired/Different from baseline Area of Impairment: Attention, Memory, Following commands, Safety/judgement, Awareness, Problem solving         Memory: Decreased short-term memory, Decreased recall of precautions Following Commands: Follows one step commands inconsistently, Follows one step commands with increased time Safety/Judgement: Decreased awareness of safety, Decreased awareness of deficits   Problem Solving: Slow processing, Decreased initiation, Difficulty sequencing, Requires verbal cues, Requires tactile cues General Comments: Pt with poor awareness of his visual cut and inattention to L side, needing repeated cues to turn his head to see things to his L or find therapists. Poor awareness of his L lateral lean in standing and poor sequencing to transfer to the L from bed to chair. Follows simple commands ~85% of time.     General Comments  SBP noted to increase to 160s momentarily on A-Line while mobilizing during session, otherwise VSS on RA            Home Living Family/patient expects to be discharged to:: Private residence Living Arrangements: Spouse/significant other (Wife has dementia) Available Help at Discharge: Family Type of Home: House Home Access: Stairs to enter Technical brewer of Steps: 2 Entrance Stairs-Rails: None Home Layout: Two level;Able to live on main level with bedroom/bathroom     Bathroom Shower/Tub: Tub/shower unit   Bathroom Toilet: Handicapped height     Home Equipment: Conservation officer, nature (2 wheels);Rollator (4 wheels);Shower seat   Additional Comments: Pt reports that he did not use any DME at baseline      Prior Functioning/Environment Prior Level of Function : Independent/Modified  Independent;Driving   Mobility Comments: No AD ADLs Comments: Played golf  OT Problem List: Decreased strength;Decreased coordination;Decreased range of motion;Impaired sensation;Decreased activity tolerance;Impaired tone;Decreased safety awareness;Impaired balance (sitting and/or standing);Decreased knowledge of use of DME or AE;Impaired vision/perception;Decreased knowledge of precautions;Impaired UE functional use      OT Treatment/Interventions: Self-care/ADL training;Splinting;Therapeutic activities;Therapeutic exercise;Cognitive remediation/compensation;Neuromuscular education;Energy conservation;Visual/perceptual remediation/compensation;Patient/family education;Manual therapy;DME and/or AE instruction;Balance training;Modalities    OT Goals(Current goals can be found in the care plan section) Acute Rehab OT Goals Patient Stated Goal: To get stronger OT Goal Formulation: With patient Time For Goal Achievement: 05/24/22 Potential to Achieve Goals: Good  OT Frequency: Min 2X/week    Co-evaluation PT/OT/SLP Co-Evaluation/Treatment: Yes Reason for Co-Treatment: Complexity of the patient's impairments (multi-system involvement);For patient/therapist safety;To address functional/ADL transfers PT goals addressed during session: Mobility/safety with mobility;Balance OT goals addressed during session: ADL's and self-care;Proper use of Adaptive equipment and DME;Strengthening/ROM      AM-PAC OT "6 Clicks" Daily Activity     Outcome Measure Help from another person eating meals?: Total (NPO) Help from another person taking care of personal grooming?: A Little Help from another person toileting, which includes using toliet, bedpan, or urinal?: Total Help from another person bathing (including washing, rinsing, drying)?: Total Help from another person to put on and taking off regular upper body clothing?: A Lot Help from another person to put on and taking off regular lower body  clothing?: Total 6 Click Score: 9   End of Session Equipment Utilized During Treatment: Gait belt Nurse Communication: Mobility status  Activity Tolerance: Patient tolerated treatment well Patient left: in chair;with call bell/phone within reach;with chair alarm set  OT Visit Diagnosis: Unsteadiness on feet (R26.81);Muscle weakness (generalized) (M62.81);Apraxia (R48.2);Hemiplegia and hemiparesis Hemiplegia - Right/Left: Left Hemiplegia - dominant/non-dominant: Non-Dominant Hemiplegia - caused by: Cerebral infarction                Time: [id number] OT Time Calculation (min): 34 min Charges:  OT General Charges $OT Visit: 1 Visit OT Evaluation $OT Eval High Complexity: 1 High OT Treatments $Self Care/Home Management : 8-22 mins  Ailene Ravel, OTR/L,CBIS  Supplemental OT - [facility] and [facility] Secure Chat Preferred    Jazman Reuter, Clarene Duke 05/10/2022, 1:46 PM

## 2022-05-10 NOTE — Anesthesia Postprocedure Evaluation (Signed)
Anesthesia Post Note  Patient: Edward Maynard  Procedure(s) Performed: IR WITH ANESTHESIA     Patient location during evaluation: SICU Anesthesia Type: General Level of consciousness: awake and alert Pain management: pain level controlled Vital Signs Assessment: post-procedure vital signs reviewed and stable Respiratory status: spontaneous breathing Cardiovascular status: stable Postop Assessment: no apparent nausea or vomiting Anesthetic complications: no  No notable events documented.  Last Vitals:  Vitals:   05/10/22 0135 05/10/22 0145  BP:  (!) 151/101  Pulse: (!) 156 (!) 154  Resp:  17  Temp:    SpO2: 92% 97%    Last Pain:  Vitals:   05/10/22 0029  TempSrc: Oral  PainSc:                  Effie Berkshire

## 2022-05-10 NOTE — ED Triage Notes (Signed)
Pt arrived from home BIB [facility] as CODE STROKE, LKW 18:45. Pt fell coming from [facility] and fell on left side, abrasion noted to left flank area. Pt has left side weakness, non-purposeful movement to left arm, unable to follow commands with left arm, left leg decrease sensation, minimal drift. HTN noted. Alert on arrival, answering questions.

## 2022-05-10 NOTE — ED Notes (Signed)
Report to CRNA [place] in IR

## 2022-05-10 NOTE — Anesthesia Procedure Notes (Signed)
Procedure Name: Intubation Date/Time: 05/10/2022 1:57 AM  Performed by: Suzy Bouchard, CRNAPre-anesthesia Checklist: Patient identified, Emergency Drugs available, Suction available, Patient being monitored and Timeout performed Patient Re-evaluated:Patient Re-evaluated prior to induction Oxygen Delivery Method: Circle system utilized Preoxygenation: Pre-oxygenation with 100% oxygen Induction Type: IV induction and Rapid sequence Laryngoscope Size: Miller and 2 Grade View: Grade I Tube type: Oral Tube size: 7.5 mm Number of attempts: 1 Airway Equipment and Method: Stylet Placement Confirmation: ETT inserted through vocal cords under direct vision, breath sounds checked- equal and bilateral and positive ETCO2 Secured at: 23 cm Tube secured with: Tape Dental Injury: Teeth and Oropharynx as per pre-operative assessment

## 2022-05-10 NOTE — ED Notes (Signed)
Pt arrived to [employer] suite, CRNA Estill Bamberg greeted this RN and Financial controller with pt. Awaiting IR RN Estill Bamberg to arrive so care handoff may occur.

## 2022-05-10 NOTE — ED Notes (Signed)
Pt placed on portable cardiac monitor

## 2022-05-10 NOTE — Evaluation (Signed)
Physical Therapy Evaluation Patient Details Name: Edward Maynard MRN: [id number] DOB: April 01, 1933 Today's Date: 05/10/2022  History of Present Illness  Pt is an 89 y.o. male who presented 05/10/22 with L-sided weakness and a fall. Outside window for tPA. 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 PMH: A-fib on Xarelto, HLD, CVA in 05/2013   Clinical Impression  Pt presents with condition above and deficits mentioned below, see PT Problem List. PTA, he reports being independent without AD, driving, playing golf, and living with his wife (who reportedly has dementia) in a 2-level house with 2 STE. Pt does not need to go upstairs. Currently, pt displays deficits in cognition, L-sided attention, L-sided vision, balance, activity tolerance, and L upper and lower extremity sensation, coordination, and strength. He required minAx2 to transition supine to sit EOB and min guard-minA for static sitting balance EOB, needing repeated cues to maintain midline positioning. His L lateral lean increases with standing, needing maxAx2 to transfer to stand and pivot to his R from the bed to a recliner. Pt is motivated to participate and improve and likely could tolerate 3 hours of therapy per day, thus recommending intensive therapy in the AIR setting to maximize his return to baseline. Will continue to follow acutely.        Recommendations for follow up therapy are one component of a multi-disciplinary discharge planning process, led by the attending physician.  Recommendations may be updated based on patient status, additional functional criteria and insurance authorization.  Follow Up Recommendations Acute inpatient rehab (3hours/day)      Assistance Recommended at Discharge Frequent or constant Supervision/Assistance  Patient can return home with the following  Two people to help with walking and/or transfers;Two people to help with  bathing/dressing/bathroom;Assistance with cooking/housework;Direct supervision/assist for medications management;Direct supervision/assist for financial management;Assist for transportation;Help with stairs or ramp for entrance;Assistance with feeding    Equipment Recommendations Other (comment) (TBA)  Recommendations for Other Services  Rehab consult    Functional Status Assessment Patient has had a recent decline in their functional status and demonstrates the ability to make significant improvements in function in a reasonable and predictable amount of time.     Precautions / Restrictions Precautions Precautions: Fall Precaution Comments: BP goal 120 - 140 within 24 hours of IR; A-line Restrictions Weight Bearing Restrictions: No      Mobility  Bed Mobility Overal bed mobility: Needs Assistance Bed Mobility: Supine to Sit     Supine to sit: Min assist, +2 for physical assistance, HOB elevated, +2 for safety/equipment     General bed mobility comments: Pt able to initiate bringing legs towards and off L EOB once gained pt's attention to turn his head to see therapist and EOB to his L. Pt grasping therapist's hand with his R UE to pull to ascend trunk, minAx2 for trunk ascension and stability due to mild impulsivity and poor safety awareness by pt.    Transfers Overall transfer level: Needs assistance Equipment used: 2 person hand held assist Transfers: Sit to/from Stand, Bed to chair/wheelchair/BSC Sit to Stand: Max assist, +2 physical assistance, +2 safety/equipment Stand pivot transfers: Max assist, +2 physical assistance, +2 safety/equipment         General transfer comment: Pt needing repeated multi-modal cues to grasp therapist's upper arm with his R hand and lean his chest anteriorly onto shoulders of therapists, face-to-face approach provided with x2 therapists and knee block to stand from  EOB. Pt demonstrating increased L lateral lean in standing, needing x1  assistance to just maintain balance and x1 assistance to pivot his hips to the R to transfer from bed to recliner.    Ambulation/Gait               General Gait Details: unable  Stairs            Wheelchair Mobility    Modified Rankin (Stroke Patients Only) Modified Rankin (Stroke Patients Only) Pre-Morbid Rankin Score: No symptoms Modified Rankin: Severe disability     Balance Overall balance assessment: Needs assistance Sitting-balance support: Single extremity supported, No upper extremity supported, Feet supported Sitting balance-Leahy Scale: Fair Sitting balance - Comments: Static sitting EOB with min guard-minA for balance, needing cues to reduce L lateral and anterior lean, but once provided cues to find midline he could maintain it for >30 seconds Postural control: Left lateral lean Standing balance support: Single extremity supported, Bilateral upper extremity supported, During functional activity Standing balance-Leahy Scale: Poor Standing balance comment: Mod-maxAx2 with L knee block and heavy L lateral lean in standing                             Pertinent Vitals/Pain Pain Assessment Pain Assessment: Faces Faces Pain Scale: No hurt Pain Intervention(s): Monitored during session    Home Living Family/patient expects to be discharged to:: Private residence Living Arrangements: Spouse/significant other (wife has dementia per pt) Available Help at Discharge: Family Type of Home: House Home Access: Stairs to enter Entrance Stairs-Rails: None Entrance Stairs-Number of Steps: 2   Home Layout: Two level;Able to live on main level with bedroom/bathroom Home Equipment: Rolling Walker (2 wheels);Rollator (4 wheels);Shower seat      Prior Function Prior Level of Function : Independent/Modified Independent;Driving             Mobility Comments: No AD ADLs Comments: Played golf     Hand Dominance   Dominant Hand: Right     Extremity/Trunk Assessment   Upper Extremity Assessment Upper Extremity Assessment: Defer to OT evaluation    Lower Extremity Assessment Lower Extremity Assessment: LLE deficits/detail LLE Deficits / Details: gross weakness, </= 3-/5 throughout; withdraws to stimulation at plantar aspect of foot but unable to detect light touch throughout otherwise; gross incoordination LLE Sensation: decreased light touch;decreased proprioception LLE Coordination: decreased gross motor;decreased fine motor    Cervical / Trunk Assessment Cervical / Trunk Assessment: Normal  Communication   Communication: Expressive difficulties;HOH (poor word articulation)  Cognition Arousal/Alertness: Awake/alert Behavior During Therapy: Restless, Impulsive (mildly) Overall Cognitive Status: Impaired/Different from baseline Area of Impairment: Attention, Memory, Following commands, Safety/judgement, Awareness, Problem solving                   Current Attention Level: Sustained Memory: Decreased short-term memory, Decreased recall of precautions Following Commands: Follows one step commands inconsistently, Follows one step commands with increased time Safety/Judgement: Decreased awareness of safety, Decreased awareness of deficits Awareness: Intellectual Problem Solving: Slow processing, Decreased initiation, Difficulty sequencing, Requires verbal cues, Requires tactile cues General Comments: Pt with poor awareness of his visual cut and inattention to L side, needing repeated cues to turn his head to see things to his L or find therapists. Poor awareness of his L lateral lean in standing and poor sequencing to transfer to the L from bed to chair. Follows simple commands ~85% of time.        General Comments General  comments (skin integrity, edema, etc.): SBP noted to increase to 160s momentarily on A-line while mobilizing during session, otherwise VSS on RA    Exercises     Assessment/Plan    PT  Assessment Patient needs continued PT services  PT Problem List Decreased strength;Decreased activity tolerance;Decreased balance;Decreased mobility;Decreased coordination;Decreased cognition;Decreased safety awareness;Impaired sensation       PT Treatment Interventions DME instruction;Gait training;Stair training;Functional mobility training;Therapeutic activities;Therapeutic exercise;Balance training;Neuromuscular re-education;Cognitive remediation;Patient/family education;Wheelchair mobility training    PT Goals (Current goals can be found in the Care Plan section)  Acute Rehab PT Goals Patient Stated Goal: to improve PT Goal Formulation: With patient Time For Goal Achievement: 05/24/22 Potential to Achieve Goals: Good    Frequency Min 4X/week     Co-evaluation PT/OT/SLP Co-Evaluation/Treatment: Yes Reason for Co-Treatment: Complexity of the patient's impairments (multi-system involvement);For patient/therapist safety;To address functional/ADL transfers PT goals addressed during session: Mobility/safety with mobility;Balance         AM-PAC PT "6 Clicks" Mobility  Outcome Measure Help needed turning from your back to your side while in a flat bed without using bedrails?: A Lot Help needed moving from lying on your back to sitting on the side of a flat bed without using bedrails?: Total Help needed moving to and from a bed to a chair (including a wheelchair)?: Total Help needed standing up from a chair using your arms (e.g., wheelchair or bedside chair)?: Total Help needed to walk in hospital room?: Total Help needed climbing 3-5 steps with a railing? : Total 6 Click Score: 7    End of Session Equipment Utilized During Treatment: Gait belt Activity Tolerance: Patient tolerated treatment well Patient left: in chair;with call bell/phone within reach;with chair alarm set;Other (comment) (hoyer lift present; chair under his feet due to pt's tall stature; chair turned to be midline  with TV) Nurse Communication: Mobility status;Need for lift equipment PT Visit Diagnosis: Unsteadiness on feet (R26.81);Other abnormalities of gait and mobility (R26.89);Muscle weakness (generalized) (M62.81);Difficulty in walking, not elsewhere classified (R26.2);Other symptoms and signs involving the nervous system (R29.898);Hemiplegia and hemiparesis Hemiplegia - Right/Left: Left Hemiplegia - dominant/non-dominant: Non-dominant Hemiplegia - caused by:  (awaiting brain MRI)    Time: [id number] PT Time Calculation (min) (ACUTE ONLY): 34 min   Charges:   PT Evaluation $PT Eval Moderate Complexity: 1 Mod          Moishe Spice, PT, DPT Acute Rehabilitation Services  Office: [phone]   Orvan Falconer 05/10/2022, 12:15 PM

## 2022-05-10 NOTE — Evaluation (Signed)
Speech Language Pathology Evaluation Patient Details Name: Edward Maynard MRN: [id number] DOB: 03-Jun-1933 Today's Date: 05/10/2022 Time: 1340-1436 SLP Time Calculation (min) (ACUTE ONLY): 56 min  Problem List:  Patient Active Problem List   Diagnosis Date Noted   Stroke (cerebrum) ([place]) 05/10/2022   Middle cerebral artery embolism, 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   Paroxysmal atrial fibrillation ([place]) 01/18/2015   Chronic anticoagulation 01/18/2015   PVC's (premature ventricular contractions) 12/14/2014   NSVT (nonsustained ventricular tachycardia) ([place]) 12/14/2014   Cerebral infarction due to embolism of cerebral artery ([place]) 07/10/2014   Hyperlipidemia 12/12/2013   Personal history of colonic polyp - adenoma  10/23/2013   Past Medical History:  Past Medical History:  Diagnosis Date   High cholesterol    05/2013; no defecits   Hyperlipemia    Left arm weakness    Left hand weakness    Personal history of colonic polyp - adenoma  10/23/2013   Stroke ([place]) 05/2013   MRI on 07/02/13 = Multifocal acute & subacute infarction involving right frontal MCA/ACA & left parietal MCA/PCA watershed areas   Past Surgical History:  Past Surgical History:  Procedure Laterality Date   LOOP RECORDER IMPLANT N/A 10/31/2013   Procedure: LOOP RECORDER IMPLANT;  Surgeon: Coralyn Mark, MD;  Location: [facility];  Service: Cardiovascular;  Laterality: N/A;   SHOULDER SURGERY Bilateral 2003, 19933   HPI:  89 year old male with atrial fibrillation (on Xarelto), prior stroke, presenting with acute onset of left hemiplegia and left hemineglect after sustaining a fall to the left while at home this evening. Underwent thrombectomy   Assessment / Plan / Recommendation Clinical Impression  Pt demonstrates mild dysarthria and cognitive impairment involving awareness, attention, sproblem solving and recall of new information. Language is in tact. Pt is intelligible and stimulable to compensatory strategies to improve  articulation. SLP demosntrated SLOP, pt return demonstrated x3 in conversation with a verbal reminder. Pt unaware of left hemiparesis; cannot verbalize or demosntrate awareness. After given multimodal cues and learning he immediately reverts to prior behavior. Teaching provided to daughter and wife regarding strategies. Pt would be an excellent candidate for intensive rehabilitation. Will f/u while admitted.    SLP Assessment  SLP Recommendation/Assessment: Patient needs continued Speech Lanaguage Pathology Services SLP Visit Diagnosis: Frontal lobe and executive function deficit Frontal lobe and executive function deficit following: Cerebral infarction    Recommendations for follow up therapy are one component of a multi-disciplinary discharge planning process, led by the attending physician.  Recommendations may be updated based on patient status, additional functional criteria and insurance authorization.    Follow Up Recommendations  Acute inpatient rehab (3hours/day)    Assistance Recommended at Discharge     Functional Status Assessment    Frequency and Duration min 2x/week  2 weeks      SLP Evaluation Cognition  Overall Cognitive Status: Impaired/Different from baseline Arousal/Alertness: Awake/alert Orientation Level: Oriented X4 Attention: Sustained;Selective Sustained Attention: Appears intact Selective Attention: Impaired Selective Attention Impairment: Functional basic Memory: Impaired Memory Impairment: Decreased short term memory;Storage deficit Decreased Short Term Memory: Verbal basic Awareness: Impaired Awareness Impairment: Intellectual impairment;Emergent impairment;Anticipatory impairment Problem Solving: Impaired Problem Solving Impairment: Functional basic Safety/Judgment: Impaired       Comprehension  Auditory Comprehension Overall Auditory Comprehension: Appears within functional limits for tasks assessed    Expression Verbal Expression Overall  Verbal Expression: Appears within functional limits for tasks assessed Written Expression Dominant Hand: Right Written Expression: Not tested   Oral / Motor  Oral Motor/Sensory Function  Overall Oral Motor/Sensory Function: Moderate impairment Facial ROM: Reduced left;Suspected CN VII (facial) dysfunction Facial Symmetry: Abnormal symmetry left;Suspected CN VII (facial) dysfunction Facial Strength: Reduced left;Suspected CN VII (facial) dysfunction Facial Sensation: Reduced left;Suspected CN V (Trigeminal) dysfunction Lingual ROM: Within Functional Limits Lingual Symmetry: Within Functional Limits Lingual Strength: Within Functional Limits Lingual Sensation: Within Functional Limits Velum: Within Functional Limits Mandible: Within Functional Limits Motor Speech Overall Motor Speech: Impaired Respiration: Within functional limits Phonation: Normal Resonance: Within functional limits Articulation: Impaired Level of Impairment: Word Intelligibility: Intelligible Motor Planning: Witnin functional limits Motor Speech Errors: Unaware;Consistent            Mable Lashley, Katherene Ponto 05/10/2022, 3:04 PM

## 2022-05-11 ENCOUNTER — Encounter (HOSPITAL_COMMUNITY): Payer: Self-pay | Admitting: Interventional Radiology

## 2022-05-11 ENCOUNTER — Inpatient Hospital Stay (HOSPITAL_COMMUNITY): Payer: Medicare Other

## 2022-05-11 DIAGNOSIS — I482 Chronic atrial fibrillation, unspecified: Secondary | ICD-10-CM

## 2022-05-11 DIAGNOSIS — I63411 Cerebral infarction due to embolism of right middle cerebral artery: Secondary | ICD-10-CM

## 2022-05-11 LAB — CBC
HCT: 39.4 % (ref 39.0–52.0)
Hemoglobin: 13.7 g/dL (ref 13.0–17.0)
MCH: 33.8 pg (ref 26.0–34.0)
MCHC: 34.8 g/dL (ref 30.0–36.0)
MCV: 97.3 fL (ref 80.0–100.0)
Platelets: 144 10*3/uL — ABNORMAL LOW (ref 150–400)
RBC: 4.05 MIL/uL — ABNORMAL LOW (ref 4.22–5.81)
RDW: 13.8 % (ref 11.5–15.5)
WBC: 8.7 10*3/uL (ref 4.0–10.5)
nRBC: 0 % (ref 0.0–0.2)

## 2022-05-11 LAB — RENAL FUNCTION PANEL
Albumin: 3.5 g/dL (ref 3.5–5.0)
Anion gap: 5 (ref 5–15)
BUN: 19 mg/dL (ref 8–23)
CO2: 27 mmol/L (ref 22–32)
Calcium: 8.5 mg/dL — ABNORMAL LOW (ref 8.9–10.3)
Chloride: 103 mmol/L (ref 98–111)
Creatinine, Ser: 1.13 mg/dL (ref 0.61–1.24)
GFR, Estimated: >60 mL/min (ref >60)
Glucose, Bld: 123 mg/dL — ABNORMAL HIGH (ref 70–99)
Phosphorus: 2.4 mg/dL — ABNORMAL LOW (ref 2.5–4.6)
Potassium: 3.9 mmol/L (ref 3.5–5.1)
Sodium: 135 mmol/L (ref 135–145)

## 2022-05-11 MED ORDER — LABETALOL HCL 5 MG/ML IV SOLN: 5.0000 mg | INTRAVENOUS | Status: DC | PRN

## 2022-05-11 MED ORDER — METOPROLOL SUCCINATE ER 25 MG PO TB24
25.0000 mg | ORAL_TABLET | Freq: Every day | ORAL | Status: DC
  Administered 2022-05-12 – 2022-05-18 (×7): 25 mg via ORAL
  Filled 2022-05-11 (×7): qty 1

## 2022-05-11 MED ORDER — CLEVIDIPINE BUTYRATE 0.5 MG/ML IV EMUL: 0.0000 mg/h | INTRAVENOUS | Status: AC

## 2022-05-11 MED ORDER — ATORVASTATIN CALCIUM 10 MG PO TABS
20.0000 mg | ORAL_TABLET | Freq: Every day | ORAL | Status: DC
  Administered 2022-05-11 – 2022-05-18 (×8): 20 mg via ORAL
  Filled 2022-05-11 (×8): qty 2

## 2022-05-11 MED ORDER — LABETALOL HCL 5 MG/ML IV SOLN
5.0000 mg | INTRAVENOUS | Status: DC | PRN
  Administered 2022-05-11 (×2): 10 mg via INTRAVENOUS
  Filled 2022-05-11 (×2): qty 4

## 2022-05-11 MED ORDER — AMLODIPINE BESYLATE 10 MG PO TABS
10.0000 mg | ORAL_TABLET | Freq: Every day | ORAL | Status: DC
  Administered 2022-05-11 – 2022-05-18 (×8): 10 mg via ORAL
  Filled 2022-05-11 (×8): qty 1

## 2022-05-11 NOTE — Progress Notes (Signed)
Discussed with Dr. Cheral Marker patient's SBP goal following the 24 hour period post IR, which ends at 0400. Per Dr. Cheral Marker, SBP goal is 130-160 following 24 hour period post IR.

## 2022-05-11 NOTE — Progress Notes (Addendum)
Dr. Cheral Marker notified that patient's SBP staying over 160 despite PRN labetalol use. MD made aware cleviprex drip order expired earlier this morning. Verbal orders for cleviprex drip with SBP goal of 130-160.

## 2022-05-11 NOTE — Progress Notes (Signed)
Inpatient Rehab Coordinator Note:  I met with pt, his wife Modena Nunnery, and his son Merry Proud at bedside to discuss CIR recommendations and goals/expectations of CIR stay.  We reviewed 3 hrs/day of therapy, physician follow up, and average length of stay 2 weeks (dependent upon progress) with goals tbd based on progress with therapy.  We discussed that pt will certainly need [id number] care at home, and would determine supervision vs level of physical assist once he'd worked with therapy again.  Per pt's son, spouse house a degree of memory loss.  They are going to discuss with family what level of care they think they can provide at d/c and I will f/u with them tomorrow.    Based on therapy note (after I visited) pt continues to require max +2 for transfers.  Do feel that he will require at least min to mod assist for mobility at discharge.  Will f/u tomorrow.   Shann Medal, PT, DPT Admissions Coordinator [phone] 05/11/22  4:49 PM

## 2022-05-11 NOTE — Progress Notes (Addendum)
STROKE TEAM PROGRESS NOTE   SUBJECTIVE (INTERVAL HISTORY) No family is at the bedside.  Overall his condition is stable. Right gaze improved some and able to cross midline now. Still has left hemiparesis and left facial droop. MRI showed large right MCA infarct and MRA showed right MCA persistent occlusion although motion degraded.    OBJECTIVE Temp:  [97.8 F (36.6 C)-100.7 F (38.2 C)] 98.5 F (36.9 C) (02/22 0800) Pulse Rate:  [69-95] 76 (02/22 1045) Cardiac Rhythm: Normal sinus rhythm (02/22 0800) Resp:  [11-25] 21 (02/22 1045) BP: (116-178)/(59-109) 137/69 (02/22 1045) SpO2:  [89 %-96 %] 92 % (02/22 1045)  Recent Labs  Lab 05/10/22 0003  GLUCAP 135*   Recent Labs  Lab 05/10/22 0005 05/10/22 0010 05/10/22 0545  NA 137 139 135  K 3.5 3.7 3.8  CL 103 103 103  CO2 21*  --  22  GLUCOSE 158* 157* 183*  BUN 20 25* 19  CREATININE 1.33* 1.30* 1.43*  CALCIUM 9.2  --  8.6*   Recent Labs  Lab 05/10/22 0005  AST 27  ALT 20  ALKPHOS 93  BILITOT 0.4  PROT 7.0  ALBUMIN 4.1   Recent Labs  Lab 05/10/22 0005 05/10/22 0010 05/10/22 0545  WBC 9.2  --  11.6*  NEUTROABS 6.6  --  9.2*  HGB 16.7 16.7 16.0  HCT 49.6 49.0 46.7  MCV 98.4  --  97.3  PLT 141*  --  164   No results for input(s): "CKTOTAL", "CKMB", "CKMBINDEX", "TROPONINI" in the last 168 hours. Recent Labs    05/10/22 0005  LABPROT 24.4*  INR 2.2*   No results for input(s): "COLORURINE", "LABSPEC", "PHURINE", "GLUCOSEU", "HGBUR", "BILIRUBINUR", "KETONESUR", "PROTEINUR", "UROBILINOGEN", "NITRITE", "LEUKOCYTESUR" in the last 72 hours.  Invalid input(s): "APPERANCEUR"     Component Value Date/Time   CHOL 134 05/10/2022 0545   TRIG 190 (H) 05/10/2022 0545   HDL 40 (L) 05/10/2022 0545   CHOLHDL 3.4 05/10/2022 0545   VLDL 38 05/10/2022 0545   LDLCALC 56 05/10/2022 0545   Lab Results  Component Value Date   HGBA1C 5.8 (H) 05/10/2022   No results found for: "LABOPIA", "COCAINSCRNUR", "LABBENZ",  "AMPHETMU", "THCU", "LABBARB"  Recent Labs  Lab 05/10/22 0005  ETH <10    I have personally reviewed the radiological images below and agree with the radiology interpretations.  IR PERCUTANEOUS ART THROMBECTOMY/INFUSION INTRACRANIAL INC DIAG ANGIO  Result Date: 05/11/2022 INDICATION: Worsening left-sided weakness and right gaze deviation. Occluded distal M2 segment of the inferior division of the right middle cerebral artery on CT angiogram of the head and neck. EXAM: 1. EMERGENT LARGE VESSEL OCCLUSION THROMBOLYSIS anterior CIRCULATION) COMPARISON:  CT angiogram of the head and neck of May 10, 2022. MEDICATIONS: Ancef 2 g IV antibiotic was administered within 1 hour of the procedure. ANESTHESIA/SEDATION: General anesthesia. CONTRAST:  Omnipaque 300 approximately 70 mL. FLUOROSCOPY TIME:  Fluoroscopy Time: 41 minutes 16 seconds (2313 mGy). COMPLICATIONS: None immediate. TECHNIQUE: Following a full explanation of the procedure along with the potential associated complications, an informed witnessed consent was obtained. The risks of intracranial hemorrhage of 10%, worsening neurological deficit, ventilator dependency, death and inability to revascularize were all reviewed in detail with the patient's spouse. The patient was then put under general anesthesia by the Department of Anesthesiology at [facility]. The right groin was prepped and draped in the usual sterile fashion. Thereafter using modified Seldinger technique, transfemoral access into the right common femoral artery was obtained without difficulty. Over a  0.035 inch guidewire an 8 French 25 cm Pinnacle sheath was inserted. Through this a combination of a 125 cm 5 [place] Berenstein support catheter inside of an 087 95 cm balloon guide catheter was advanced initially proximal to the right common carotid bifurcation and then into the distal right internal carotid artery. FINDINGS: The right common carotid bifurcation demonstrates the  right external carotid artery and its major branches to be widely patent. The right internal carotid artery at the bulb demonstrates a smooth shallow plaque along the posterior wall associated with 10% stenosis by the NASCET criteria. Distal to this the right internal carotid artery demonstrates opacification to the cranial skull base. The petrous, the cavernous and the supraclinoid right ICA demonstrate wide patency. A right posterior communicating artery is seen opacifying the right posterior cerebral artery distribution. The right anterior cerebral artery opacifies into the capillary and venous phases. The right middle cerebral artery M1 segment is widely patent. There is an occlusion of a parietal branch of the inferior division of the right middle cerebral artery in the distal M3 region. Delayed arterial and capillary phase demonstrates significant retrograde opacification of the distal right parietal cortical and subcortical distribution from the anterior cerebral and the P3 leptomeningeal branches of the right posterior cerebral artery. PROCEDURE: Through the balloon guide catheter in the distal right internal carotid artery, a combination of an 045 Zoom aspiration catheter with a 160 cm Trevo microcatheter was advanced over an 014 inch soft tip micro guidewire with a moderate J configuration to the supraclinoid right ICA. The micro guidewire was then gently manipulated with a torque device and advanced without difficulty distal to the occluded branch of the inferior division into the M3 region followed by the microcatheter. A 3 mm x 20 mm Solitaire X retrieval device was then deployed following verification of safe positioning of the tip of the microcatheter. This was then followed by the advancement of the Zoom aspiration catheter which was engaged just inside the occluded portion of the vessel, the micro guidewire and the microcatheter were retrieved proximally. Following 2 minutes of contact aspiration  with a Penumbra aspiration device and proximal flow arrest, the Zoom aspiration catheter was removed. Two approximately 1 mm pearly sticky clots were retrieved. A control arteriogram demonstrated no significant change in the occluded parietal branch. A second pass was then made using the above combination. The microcatheter was advanced past the occlusion into the M3 region with the micro guidewire. The guidewire was removed. Good aspiration obtained from the hub of the microcatheter which was then connected to continuous heparinized saline infusion. The Zoom aspiration catheter was then engaged just inside the occluded segment of the vessel. A 3 mm x 20 mm Solitaire X retrieval device was then deployed in the usual manner. Thereafter, constant aspiration was applied with proximal flow arrest the hub of the Zoom aspiration catheter for a minute and a half. Following this the combination of the retrieval device, the microcatheter and the aspiration catheter were removed. A control arteriogram performed through the balloon guide catheter following reversal of flow arrest now demonstrated improved flow through the occluded branch though slow to the M3 M4 regions. A third pass was then made using a combination using an 035 inch aspiration catheter advanced over an 014 inch soft tip micro guidewire with a moderate J configuration to the right middle cerebral artery. Access into the now reoccluded inferior division M2 branch was then made with the micro guidewire followed by the microcatheter into the distal  M3 region. The guidewire was removed. The 035 inch Zoom aspiration was then engaged into the clot with no aspiration obtained. Aspiration was then applied with a Penumbra aspiration device for approximately 2 minutes with proximal flow arrest. The aspiration catheter was removed. A control arteriogram performed through the balloon guide catheter demonstrated mild improvement in revascularization of the occluded branch  with the clot noted slightly more distally. A fourth pass was then made this time again using an 045 Zoom aspiration catheter advanced with an 021 microcatheter and over 014 inch micro guidewire. This combination was then advanced into the M3 region of the occluded branch followed by the microcatheter. The guidewire was removed. Good aspiration obtained from the hub of the microcatheter which was positioned distally in the M3 segment. This was then connected to continuous heparinized saline infusion. A 4 mm x 40 mm Solitaire X retrieval stent was then deployed in the usual manner with the 045 Zoom aspiration catheter imbedded in the occluded branch. Aspiration was then for approximately 2 minutes at the hub of the aspiration catheter with proximal flow arrest. Thereafter, the retriever, the microcatheter and the Zoom aspiration catheter were removed. A control arteriogram performed following reversal of flow arrest in the right internal carotid artery, continued to demonstrate slow antegrade flow distal to the occluded segment. A control arteriogram performed through the balloon guide catheter continued to demonstrate a TICI 2C over revascularization with only mild distal flow past the occluded M2 branch of the right inferior division MCA. No further passes were made due to now steadily increased risk of vessel injury perforation. The balloon guide was retrieved in the right common carotid artery. A control arteriogram performed through this continued to demonstrate patency of right internal carotid artery proximally and distally including that of the right posterior communicating artery, the right anterior cerebral artery and the right middle cerebral artery distributions. An 8 French Angio-Seal closure device was then deployed in the right groin puncture site for hemostasis. Distal pulses remained Dopplerable in both feet unchanged from prior to the procedure. A flat panel CT of the brain demonstrated no evidence  of intra cerebral hemorrhage. The patient's general anesthesia was reversed, and patient was extubated. Initially agitated, the patient gradually settled down to where simple commands appropriately and verbalized appropriately. The patient moved his left lower extremities spontaneously and to command with good strength. Slight movement was evident in the left upper extremity. The patient was then transferred to the neuro ICU for post revascularization management. IMPRESSION: Status post endovascular revascularization of occluded distal M2 branch of the right middle cerebral artery inferior division with 2 passes with a 3 mm x 20 mm retrieval device and contact aspiration, 1 pass with a 4 mm x 40 mm Solitaire X retrieval device and contact aspiration, 1 pass with contact aspiration with minimal improvement in revascularization of the occluded right MCA inferior division M2 segment occlusion. A TICI 2C revascularization was maintained. PLAN: Follow-up as per referring MD. Electronically Signed   By: Luanne Bras M.D.   On: 05/11/2022 08:28   IR CT Head Ltd  Result Date: 05/11/2022 INDICATION: Worsening left-sided weakness and right gaze deviation. Occluded distal M2 segment of the inferior division of the right middle cerebral artery on CT angiogram of the head and neck. EXAM: 1. EMERGENT LARGE VESSEL OCCLUSION THROMBOLYSIS anterior CIRCULATION) COMPARISON:  CT angiogram of the head and neck of May 10, 2022. MEDICATIONS: Ancef 2 g IV antibiotic was administered within 1 hour of the procedure.  ANESTHESIA/SEDATION: General anesthesia. CONTRAST:  Omnipaque 300 approximately 70 mL. FLUOROSCOPY TIME:  Fluoroscopy Time: 41 minutes 16 seconds (2313 mGy). COMPLICATIONS: None immediate. TECHNIQUE: Following a full explanation of the procedure along with the potential associated complications, an informed witnessed consent was obtained. The risks of intracranial hemorrhage of 10%, worsening neurological deficit,  ventilator dependency, death and inability to revascularize were all reviewed in detail with the patient's spouse. The patient was then put under general anesthesia by the Department of Anesthesiology at [facility]. The right groin was prepped and draped in the usual sterile fashion. Thereafter using modified Seldinger technique, transfemoral access into the right common femoral artery was obtained without difficulty. Over a 0.035 inch guidewire an 8 French 25 cm Pinnacle sheath was inserted. Through this a combination of a 125 cm 5 [place] Berenstein support catheter inside of an 087 95 cm balloon guide catheter was advanced initially proximal to the right common carotid bifurcation and then into the distal right internal carotid artery. FINDINGS: The right common carotid bifurcation demonstrates the right external carotid artery and its major branches to be widely patent. The right internal carotid artery at the bulb demonstrates a smooth shallow plaque along the posterior wall associated with 10% stenosis by the NASCET criteria. Distal to this the right internal carotid artery demonstrates opacification to the cranial skull base. The petrous, the cavernous and the supraclinoid right ICA demonstrate wide patency. A right posterior communicating artery is seen opacifying the right posterior cerebral artery distribution. The right anterior cerebral artery opacifies into the capillary and venous phases. The right middle cerebral artery M1 segment is widely patent. There is an occlusion of a parietal branch of the inferior division of the right middle cerebral artery in the distal M3 region. Delayed arterial and capillary phase demonstrates significant retrograde opacification of the distal right parietal cortical and subcortical distribution from the anterior cerebral and the P3 leptomeningeal branches of the right posterior cerebral artery. PROCEDURE: Through the balloon guide catheter in the distal right  internal carotid artery, a combination of an 045 Zoom aspiration catheter with a 160 cm Trevo microcatheter was advanced over an 014 inch soft tip micro guidewire with a moderate J configuration to the supraclinoid right ICA. The micro guidewire was then gently manipulated with a torque device and advanced without difficulty distal to the occluded branch of the inferior division into the M3 region followed by the microcatheter. A 3 mm x 20 mm Solitaire X retrieval device was then deployed following verification of safe positioning of the tip of the microcatheter. This was then followed by the advancement of the Zoom aspiration catheter which was engaged just inside the occluded portion of the vessel, the micro guidewire and the microcatheter were retrieved proximally. Following 2 minutes of contact aspiration with a Penumbra aspiration device and proximal flow arrest, the Zoom aspiration catheter was removed. Two approximately 1 mm pearly sticky clots were retrieved. A control arteriogram demonstrated no significant change in the occluded parietal branch. A second pass was then made using the above combination. The microcatheter was advanced past the occlusion into the M3 region with the micro guidewire. The guidewire was removed. Good aspiration obtained from the hub of the microcatheter which was then connected to continuous heparinized saline infusion. The Zoom aspiration catheter was then engaged just inside the occluded segment of the vessel. A 3 mm x 20 mm Solitaire X retrieval device was then deployed in the usual manner. Thereafter, constant aspiration was applied with proximal  flow arrest the hub of the Zoom aspiration catheter for a minute and a half. Following this the combination of the retrieval device, the microcatheter and the aspiration catheter were removed. A control arteriogram performed through the balloon guide catheter following reversal of flow arrest now demonstrated improved flow through the  occluded branch though slow to the M3 M4 regions. A third pass was then made using a combination using an 035 inch aspiration catheter advanced over an 014 inch soft tip micro guidewire with a moderate J configuration to the right middle cerebral artery. Access into the now reoccluded inferior division M2 branch was then made with the micro guidewire followed by the microcatheter into the distal M3 region. The guidewire was removed. The 035 inch Zoom aspiration was then engaged into the clot with no aspiration obtained. Aspiration was then applied with a Penumbra aspiration device for approximately 2 minutes with proximal flow arrest. The aspiration catheter was removed. A control arteriogram performed through the balloon guide catheter demonstrated mild improvement in revascularization of the occluded branch with the clot noted slightly more distally. A fourth pass was then made this time again using an 045 Zoom aspiration catheter advanced with an 021 microcatheter and over 014 inch micro guidewire. This combination was then advanced into the M3 region of the occluded branch followed by the microcatheter. The guidewire was removed. Good aspiration obtained from the hub of the microcatheter which was positioned distally in the M3 segment. This was then connected to continuous heparinized saline infusion. A 4 mm x 40 mm Solitaire X retrieval stent was then deployed in the usual manner with the 045 Zoom aspiration catheter imbedded in the occluded branch. Aspiration was then for approximately 2 minutes at the hub of the aspiration catheter with proximal flow arrest. Thereafter, the retriever, the microcatheter and the Zoom aspiration catheter were removed. A control arteriogram performed following reversal of flow arrest in the right internal carotid artery, continued to demonstrate slow antegrade flow distal to the occluded segment. A control arteriogram performed through the balloon guide catheter continued to  demonstrate a TICI 2C over revascularization with only mild distal flow past the occluded M2 branch of the right inferior division MCA. No further passes were made due to now steadily increased risk of vessel injury perforation. The balloon guide was retrieved in the right common carotid artery. A control arteriogram performed through this continued to demonstrate patency of right internal carotid artery proximally and distally including that of the right posterior communicating artery, the right anterior cerebral artery and the right middle cerebral artery distributions. An 8 French Angio-Seal closure device was then deployed in the right groin puncture site for hemostasis. Distal pulses remained Dopplerable in both feet unchanged from prior to the procedure. A flat panel CT of the brain demonstrated no evidence of intra cerebral hemorrhage. The patient's general anesthesia was reversed, and patient was extubated. Initially agitated, the patient gradually settled down to where simple commands appropriately and verbalized appropriately. The patient moved his left lower extremities spontaneously and to command with good strength. Slight movement was evident in the left upper extremity. The patient was then transferred to the neuro ICU for post revascularization management. IMPRESSION: Status post endovascular revascularization of occluded distal M2 branch of the right middle cerebral artery inferior division with 2 passes with a 3 mm x 20 mm retrieval device and contact aspiration, 1 pass with a 4 mm x 40 mm Solitaire X retrieval device and contact aspiration, 1 pass with contact  aspiration with minimal improvement in revascularization of the occluded right MCA inferior division M2 segment occlusion. A TICI 2C revascularization was maintained. PLAN: Follow-up as per referring MD. Electronically Signed   By: Luanne Bras M.D.   On: 05/11/2022 08:28   MR BRAIN WO CONTRAST  Result Date: 05/10/2022 CLINICAL  DATA:  Stroke, follow up EXAM: MRI HEAD WITHOUT CONTRAST MRA HEAD WITHOUT CONTRAST TECHNIQUE: Multiplanar, multi-echo pulse sequences of the brain and surrounding structures were acquired without intravenous contrast. Angiographic images of the Circle of Willis were acquired using MRA technique without intravenous contrast. COMPARISON:  CTA and CT head from same day. FINDINGS: MRI HEAD FINDINGS Motion limited study. Brain: Acute posterior right MCA territory infarct. Associated edema and regional mass effect without significant midline shift. No mass occupying acute hemorrhage. No mass lesion. No hydrocephalus. Remote infarct in the right cerebellum. Vascular: Detailed below. Skull and upper cervical spine: Normal marrow signal. Sinuses/Orbits: Clear sinuses.  No acute orbital findings. MRA HEAD FINDINGS Severely motion limited study. Anterior circulation: Bilateral intracranial ICAs, proximal ACAs and proximal M1 MCAs appear grossly patent. Essentially nondiagnostic evaluation of the M2 MCAs, including the region of occlusion seen on recent CTA. Posterior circulation: The intradural vertebral arteries, basilar arteries and proximal PCAs are grossly patent. IMPRESSION: 1. Acute right posterior MCA territory infarct. Associated edema without midline shift. 2. Severely motion limited MRA with nondiagnostic evaluation of the right M2 MCA in the region of recently seen occlusion on CTA. A repeat exclude exam may be able to better assess if the patient is able in clinically warranted. Electronically Signed   By: Margaretha Sheffield M.D.   On: 05/10/2022 16:03   MR ANGIO HEAD WO CONTRAST  Result Date: 05/10/2022 CLINICAL DATA:  Stroke, follow up EXAM: MRI HEAD WITHOUT CONTRAST MRA HEAD WITHOUT CONTRAST TECHNIQUE: Multiplanar, multi-echo pulse sequences of the brain and surrounding structures were acquired without intravenous contrast. Angiographic images of the Circle of Willis were acquired using MRA technique without  intravenous contrast. COMPARISON:  CTA and CT head from same day. FINDINGS: MRI HEAD FINDINGS Motion limited study. Brain: Acute posterior right MCA territory infarct. Associated edema and regional mass effect without significant midline shift. No mass occupying acute hemorrhage. No mass lesion. No hydrocephalus. Remote infarct in the right cerebellum. Vascular: Detailed below. Skull and upper cervical spine: Normal marrow signal. Sinuses/Orbits: Clear sinuses.  No acute orbital findings. MRA HEAD FINDINGS Severely motion limited study. Anterior circulation: Bilateral intracranial ICAs, proximal ACAs and proximal M1 MCAs appear grossly patent. Essentially nondiagnostic evaluation of the M2 MCAs, including the region of occlusion seen on recent CTA. Posterior circulation: The intradural vertebral arteries, basilar arteries and proximal PCAs are grossly patent. IMPRESSION: 1. Acute right posterior MCA territory infarct. Associated edema without midline shift. 2. Severely motion limited MRA with nondiagnostic evaluation of the right M2 MCA in the region of recently seen occlusion on CTA. A repeat exclude exam may be able to better assess if the patient is able in clinically warranted. Electronically Signed   By: Margaretha Sheffield M.D.   On: 05/10/2022 16:03   ECHOCARDIOGRAM COMPLETE  Result Date: 05/10/2022    ECHOCARDIOGRAM REPORT   Patient Name:   DAUSON LAWMAN Date of Exam: 05/10/2022 Medical Rec #:  [id number]     Height:       71.5 in Accession #:    [id number]    Weight:       223.1 lb Date of Birth:  1933-05-10    BSA:  2.220 m Patient Age:    71 years      BP:           160/90 mmHg Patient Gender: M             HR:           73 bpm. Exam Location:  Inpatient Procedure: 2D Echo, Cardiac Doppler and Color Doppler Indications:    Stroke I63.9  History:        Patient has prior history of Echocardiogram examinations and                 Patient has no prior history of Echocardiogram examinations.                  Pacemaker, Stroke, Arrythmias:PVC and Tachycardia; Risk                 Factors:Dyslipidemia.  Sonographer:    Ronny Flurry Referring Phys: [phone] ERIC LINDZEN  Sonographer Comments: Image acquisition challenging due to respiratory motion. IMPRESSIONS  1. Left ventricular ejection fraction, by estimation, is 60 to 65%. The left ventricle has normal function. The left ventricle has no regional wall motion abnormalities. Left ventricular diastolic parameters are consistent with Grade II diastolic dysfunction (pseudonormalization).  2. Right ventricular systolic function is normal. The right ventricular size is normal. There is normal pulmonary artery systolic pressure.  3. The mitral valve is normal in structure. No evidence of mitral valve regurgitation. No evidence of mitral stenosis.  4. The aortic valve is calcified. Aortic valve regurgitation is trivial. Mild aortic valve stenosis. Aortic valve area, by VTI measures 1.55 cm. Aortic valve mean gradient measures 19.0 mmHg. Aortic valve Vmax measures 2.94 m/s.  5. The inferior vena cava is normal in size with greater than 50% respiratory variability, suggesting right atrial pressure of 3 mmHg. FINDINGS  Left Ventricle: Left ventricular ejection fraction, by estimation, is 60 to 65%. The left ventricle has normal function. The left ventricle has no regional wall motion abnormalities. The left ventricular internal cavity size was normal in size. There is  no left ventricular hypertrophy. Left ventricular diastolic parameters are consistent with Grade II diastolic dysfunction (pseudonormalization). Indeterminate filling pressures. Right Ventricle: The right ventricular size is normal. No increase in right ventricular wall thickness. Right ventricular systolic function is normal. There is normal pulmonary artery systolic pressure. The tricuspid regurgitant velocity is 1.45 m/s, and  with an assumed right atrial pressure of 3 mmHg, the estimated right  ventricular systolic pressure is [id number] mmHg. Left Atrium: Left atrial size was normal in size. Right Atrium: Right atrial size was normal in size. Pericardium: There is no evidence of pericardial effusion. Mitral Valve: The mitral valve is normal in structure. No evidence of mitral valve regurgitation. No evidence of mitral valve stenosis. Tricuspid Valve: The tricuspid valve is normal in structure. Tricuspid valve regurgitation is trivial. No evidence of tricuspid stenosis. Aortic Valve: The aortic valve is calcified. Aortic valve regurgitation is trivial. Mild aortic stenosis is present. Aortic valve mean gradient measures 19.0 mmHg. Aortic valve peak gradient measures 34.5 mmHg. Aortic valve area, by VTI measures 1.55 cm. Pulmonic Valve: The pulmonic valve was normal in structure. Pulmonic valve regurgitation is not visualized. No evidence of pulmonic stenosis. Aorta: The aortic root is normal in size and structure. Venous: The inferior vena cava is normal in size with greater than 50% respiratory variability, suggesting right atrial pressure of 3 mmHg. IAS/Shunts: No atrial level shunt detected by color flow  Doppler.  LEFT VENTRICLE PLAX 2D LVIDd:         4.60 cm   Diastology LVIDs:         3.70 cm   LV e' medial:    7.80 cm/s LV PW:         1.30 cm   LV E/e' medial:  10.4 LV IVS:        1.20 cm   LV e' lateral:   11.60 cm/s LVOT diam:     2.20 cm   LV E/e' lateral: 7.0 LV SV:         89 LV SV Index:   40 LVOT Area:     3.80 cm  RIGHT VENTRICLE             IVC RV S prime:     16.60 cm/s  IVC diam: 2.10 cm TAPSE (M-mode): 1.5 cm LEFT ATRIUM             Index        RIGHT ATRIUM           Index LA diam:        4.60 cm 2.07 cm/m   RA Area:     15.10 cm LA Vol (A2C):   64.7 ml 29.14 ml/m  RA Volume:   31.80 ml  14.32 ml/m LA Vol (A4C):   58.7 ml 26.44 ml/m LA Biplane Vol: 62.9 ml 28.33 ml/m  AORTIC VALVE AV Area (Vmax):    1.61 cm AV Area (Vmean):   1.68 cm AV Area (VTI):     1.55 cm AV Vmax:            293.50 cm/s AV Vmean:          189.800 cm/s AV VTI:            0.570 m AV Peak Grad:      34.5 mmHg AV Mean Grad:      19.0 mmHg LVOT Vmax:         124.00 cm/s LVOT Vmean:        83.833 cm/s LVOT VTI:          0.233 m LVOT/AV VTI ratio: 0.41  AORTA Ao Root diam: 3.30 cm MITRAL VALVE               TRICUSPID VALVE MV Area (PHT): 4.68 cm    TR Peak grad:   8.4 mmHg MV Decel Time: 162 msec    TR Vmax:        145.00 cm/s MV E velocity: 81.00 cm/s MV A velocity: 65.50 cm/s  SHUNTS MV E/A ratio:  1.24        Systemic VTI:  0.23 m                            Systemic Diam: 2.20 cm Skeet Latch MD Electronically signed by Skeet Latch MD Signature Date/Time: 05/10/2022/10:48:39 AM    Final    CT HEAD CODE STROKE WO CONTRAST  Result Date: 05/10/2022 CLINICAL DATA:  Acute neurologic deficit EXAM: CT HEAD WITHOUT CONTRAST CT CERVICAL SPINE WITHOUT CONTRAST TECHNIQUE: Multidetector CT imaging of the head and cervical spine was performed following the standard protocol without intravenous contrast. Multiplanar CT image reconstructions of the cervical spine were also generated. RADIATION DOSE REDUCTION: This exam was performed according to the departmental dose-optimization program which includes automated exposure control, adjustment of the mA and/or kV according to patient size and/or  use of iterative reconstruction technique. COMPARISON:  None Available. FINDINGS: CT HEAD FINDINGS Brain: There is no mass, hemorrhage or extra-axial collection. Generalized atrophy. Multiple old small vessel infarcts of the cerebellum. There is hypoattenuation of the periventricular white matter, most commonly indicating chronic ischemic microangiopathy. Old right frontal and parietal infarcts. Vascular: No abnormal hyperdensity of the major intracranial arteries or dural venous sinuses. No intracranial atherosclerosis. Skull: The visualized skull base, calvarium and extracranial soft tissues are normal. Sinuses/Orbits: No fluid levels  or advanced mucosal thickening of the visualized paranasal sinuses. No mastoid or middle ear effusion. The orbits are normal. ASPECTS ([place] Stroke Program Early CT Score) - Ganglionic level infarction (caudate, lentiform nuclei, internal capsule, insula, M1-M3 cortex): 7 - Supraganglionic infarction (M4-M6 cortex): 3 Total score (0-10 with 10 being normal): 10 CT CERVICAL SPINE FINDINGS Alignment: No static subluxation. Facets are aligned. Occipital condyles and the lateral masses of C1 and C2 are normally approximated. Skull base and vertebrae: No acute fracture. Soft tissues and spinal canal: No prevertebral fluid or swelling. No visible canal hematoma. Disc levels: No advanced spinal canal or neural foraminal stenosis. Upper chest: No pneumothorax, pulmonary nodule or pleural effusion. Other: Normal visualized paraspinal cervical soft tissues. IMPRESSION: 1. No acute intracranial abnormality. 2. ASPECTS is 10. 3. No acute fracture or static subluxation of the cervical spine. These results were communicated to Dr. Kerney Elbe at 12:24 am on 05/10/2022 by text page via the [facility] messaging system. Electronically Signed   By: Ulyses Jarred M.D.   On: 05/10/2022 00:47   CT C-SPINE NO CHARGE  Result Date: 05/10/2022 CLINICAL DATA:  Acute neurologic deficit EXAM: CT HEAD WITHOUT CONTRAST CT CERVICAL SPINE WITHOUT CONTRAST TECHNIQUE: Multidetector CT imaging of the head and cervical spine was performed following the standard protocol without intravenous contrast. Multiplanar CT image reconstructions of the cervical spine were also generated. RADIATION DOSE REDUCTION: This exam was performed according to the departmental dose-optimization program which includes automated exposure control, adjustment of the mA and/or kV according to patient size and/or use of iterative reconstruction technique. COMPARISON:  None Available. FINDINGS: CT HEAD FINDINGS Brain: There is no mass, hemorrhage or extra-axial collection.  Generalized atrophy. Multiple old small vessel infarcts of the cerebellum. There is hypoattenuation of the periventricular white matter, most commonly indicating chronic ischemic microangiopathy. Old right frontal and parietal infarcts. Vascular: No abnormal hyperdensity of the major intracranial arteries or dural venous sinuses. No intracranial atherosclerosis. Skull: The visualized skull base, calvarium and extracranial soft tissues are normal. Sinuses/Orbits: No fluid levels or advanced mucosal thickening of the visualized paranasal sinuses. No mastoid or middle ear effusion. The orbits are normal. ASPECTS ([place] Stroke Program Early CT Score) - Ganglionic level infarction (caudate, lentiform nuclei, internal capsule, insula, M1-M3 cortex): 7 - Supraganglionic infarction (M4-M6 cortex): 3 Total score (0-10 with 10 being normal): 10 CT CERVICAL SPINE FINDINGS Alignment: No static subluxation. Facets are aligned. Occipital condyles and the lateral masses of C1 and C2 are normally approximated. Skull base and vertebrae: No acute fracture. Soft tissues and spinal canal: No prevertebral fluid or swelling. No visible canal hematoma. Disc levels: No advanced spinal canal or neural foraminal stenosis. Upper chest: No pneumothorax, pulmonary nodule or pleural effusion. Other: Normal visualized paraspinal cervical soft tissues. IMPRESSION: 1. No acute intracranial abnormality. 2. ASPECTS is 10. 3. No acute fracture or static subluxation of the cervical spine. These results were communicated to Dr. Kerney Elbe at 12:24 am on 05/10/2022 by text page via the [facility] messaging  system. Electronically Signed   By: Ulyses Jarred M.D.   On: 05/10/2022 00:47   CT ANGIO HEAD NECK W WO CM (CODE STROKE)  Result Date: 05/10/2022 CLINICAL DATA:  Acute neurologic deficit EXAM: CT ANGIOGRAPHY HEAD AND NECK TECHNIQUE: Multidetector CT imaging of the head and neck was performed using the standard protocol during bolus administration of  intravenous contrast. Multiplanar CT image reconstructions and MIPs were obtained to evaluate the vascular anatomy. Carotid stenosis measurements (when applicable) are obtained utilizing NASCET criteria, using the distal internal carotid diameter as the denominator. RADIATION DOSE REDUCTION: This exam was performed according to the departmental dose-optimization program which includes automated exposure control, adjustment of the mA and/or kV according to patient size and/or use of iterative reconstruction technique. COMPARISON:  None Available. FINDINGS: CTA NECK FINDINGS SKELETON: There is no bony spinal canal stenosis. No lytic or blastic lesion. OTHER NECK: 12 mm right parotid nodule (series 7 image 81). UPPER CHEST: No pneumothorax or pleural effusion. No nodules or masses. AORTIC ARCH: There is calcific atherosclerosis of the aortic arch. There is no aneurysm, dissection or hemodynamically significant stenosis of the visualized portion of the aorta. Normal variant aortic arch branching pattern with the left vertebral artery arising independently from the aortic arch. The visualized proximal subclavian arteries are widely patent. RIGHT CAROTID SYSTEM: Atherosclerotic web at the bifurcation. Mixed density plaque with less than 50% stenosis. Widely patent ICA. LEFT CAROTID SYSTEM: Calcific atherosclerosis at the bifurcation without hemodynamically significant stenosis. VERTEBRAL ARTERIES: Right dominant configuration. Both origins are clearly patent. There is no dissection, occlusion or flow-limiting stenosis to the skull base (V1-V3 segments). CTA HEAD FINDINGS POSTERIOR CIRCULATION: --Vertebral arteries: Right V4 segment atherosclerosis with widely maintained patency. --Inferior cerebellar arteries: Normal. --Basilar artery: Normal. --Superior cerebellar arteries: Normal. --Posterior cerebral arteries (PCA): Normal. ANTERIOR CIRCULATION: --Intracranial internal carotid arteries: Atherosclerotic calcification of  the internal carotid arteries at the skull base without hemodynamically significant stenosis. --Anterior cerebral arteries (ACA): Normal. Both A1 segments are present. Patent anterior communicating artery (a-comm). --Middle cerebral arteries (MCA): There is occlusion of the proximal M2 segment inferior division of the right MCA (series 10, image 114). Left MCA is normal. VENOUS SINUSES: As permitted by contrast timing, patent. ANATOMIC VARIANTS: Fetal origin of the right posterior cerebral artery. Review of the MIP images confirms the above findings. IMPRESSION: 1. Occlusion of the proximal M2 segment inferior division of the right MCA. 2. 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 3. Nonspecific 12 mm right parotid nodule. 4. Critical Value/emergent results were called by telephone at the time of interpretation on 05/10/2022 at 12:38 am to provider ERIC [facility] , who verbally acknowledged these results. By Electronically Signed   By: Ulyses Jarred M.D.   On: 05/10/2022 00:39     PHYSICAL EXAM  Temp:  [97.8 F (36.6 C)-100.7 F (38.2 C)] 98.5 F (36.9 C) (02/22 0800) Pulse Rate:  [69-95] 76 (02/22 1045) Resp:  [11-25] 21 (02/22 1045) BP: (116-178)/(59-109) 137/69 (02/22 1045) SpO2:  [89 %-96 %] 92 % (02/22 1045)  General - Well nourished, well developed, in no apparent distress.  Ophthalmologic - fundi not visualized due to noncooperation.  Cardiovascular - irregularly irregular heart rate and rhythm.  Neuro - awake, alert, eyes open, orientated to age, place, time. No aphasia but moderate dysarthria, following all simple commands. Able to name and repeat with dysarthric voice. Right gaze preference and barely cross midline, tracking on the right visual field, not blinking to  visual threat on the left. Left facial droop. Tongue protrusion to the left. LUE deltoid 1/5, bicep and tricep 3-/5, finger grip 0/5. Left LE proximal 3-/5, distal 4-/5. RUE 4/5 and RLE  3/5 at least but on knee brace to avoid movement. Left sensory diminished with neglect. Right FTN intact. Gait not tested.    ASSESSMENT/PLAN [name] is a 89 y.o. male with history of A-fib on Xarelto, hyperlipidemia, stroke in 05/2013 admitted for left-sided weakness and he fell at home. No tPA given due to outside window.    Stroke:  right large MCA infarct due to right M2 occlusion status post IR with  [id number], embolic secondary to A-fib given on Xarelto  CT no acute abnormality CTA head and neck right M2 proximal occlusion, right ICA bulb atherosclerosis IR with right M2 distal occlusion. S/p 4 passes, TICI2c  MRI large right MCA infarct MRA persistent right MCA occlusion although motion degraded 2D Echo EF 60 to 65% LDL 56 HgbA1c 5.8 Heparin subcu for VTE prophylaxis Xarelto (rivaroxaban) daily prior to admission, now on ASA 325 given large size of infarct. Will consider eliquis in 5-7 days.  Ongoing aggressive stroke risk factor management Therapy recommendations: CIR Disposition: Pending  History of stroke 05/2013 admitted for left-sided weakness and imbalance.  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  MRA/TTE/carotid Doppler negative.  Discharged on aspirin and Zocor. Outpatient 30-day CardioNet monitoring negative and loop recorder placed.  Found to have A-fib in 12/2014, started on Xarelto.  Mild left sided residual at baseline.  Chronic A-fib On Xarelto at home Currently [facility] on hold given large size of infarct Now on ASA 325 will consider Eliquis in 5-7 days  Hypertension Home meds: toprol 12.5 Stable on the high end BP goal < 180/105 Off cleviprex Put on amlodipine and increase home toprol from 12.5 to 25 Long term BP goal normotensive  Hyperlipidemia Home meds: Zocor 40 LDL 56, goal < 70 Will resume Zocor 40 after p.o. access No high intensity statin given LDL at goal Continue statin at discharge  Other Stroke Risk  Factors Advanced age Obesity, Body mass index is 30.68 kg/m.   Other Active Problems CKD 3A, creatinine 1.30-1.43-1.13 Mild leukocytosis, WBC 11.6-8.7  Hospital day # 1  This patient is critically ill due to stroke status post IR, A-fib, history of stroke and at significant risk of neurological worsening, death form recurrent stroke, hemorrhagic transformation, heart failure. This patient's care requires constant monitoring of vital signs, hemodynamics, respiratory and cardiac monitoring, review of multiple databases, neurological assessment, discussion with family, other specialists and medical decision making of high complexity. I spent 30 minutes of neurocritical care time in the care of this patient.   Rosalin Hawking, MD PhD Stroke Neurology 05/11/2022 11:29 AM    To contact Stroke Continuity provider, please refer to [email or web address] After hours, contact General Neurology

## 2022-05-11 NOTE — Progress Notes (Signed)
Referring Physician(s): Code Stroke  Supervising Physician: Luanne Bras  Patient Status:  [facility] - In-pt  Chief Complaint: R MCA infarct  Subjective: Resting in bed, remains on precautions and is lying flat.  He has left-sided facial droop. He is alert, oriented, conversant.  Slight dysarthria, but no aphasia. Follows commands.  Able to lift LLE off the bed.  Able to move left upper extremity.   Right gaze preference.  Allergies: Patient has no known allergies.  Medications: Prior to Admission medications   Medication Sig Start Date End Date Taking? Authorizing Provider  metoprolol succinate (TOPROL XL) 25 MG 24 hr tablet Take 0.5 tablets (12.5 mg total) by mouth daily. 09/12/21  Yes Allred, Jeneen Rinks, MD  rivaroxaban (XARELTO) 20 MG TABS tablet TAKE 1 TABLET BY MOUTH DAILY  WITH SUPPER Patient taking differently: Take 20 mg by mouth daily with supper. 11/01/21  Yes Allred, Jeneen Rinks, MD  simvastatin (ZOCOR) 40 MG tablet Take 1 tablet by mouth  daily at 6 PM. 12/28/16  Yes Chanetta Marshall K, NP     Vital Signs: BP (!) 143/95   Pulse 75   Temp 98.5 F (36.9 C) (Oral)   Resp 16   Ht 5' 11.5" (1.816 m)   Wt 223 lb 1.7 oz (101.2 kg)   SpO2 92%   BMI 30.68 kg/m   Physical Exam Vitals reviewed.  Pulmonary:     Effort: Pulmonary effort is normal.  Skin:    General: Skin is warm and dry.  Neurological:     Mental Status: He is alert and oriented to person, place, and time.  Psychiatric:        Mood and Affect: Mood normal.        Behavior: Behavior normal.    NAD, alert Neuro:  alert, oriented, follows commands.   Pupils reactive, R > L.   Right gaze preference.  Dysarthria without aphasia. Able to turn head to the left.  Able to lift left upper extremity, poor fine motor control. Very minimal grip strength on left.   Able to lift LLE off bed, diminished sensation of LLE.  Right CFA puncture site is soft, non-tender, minimal dried blood on dressing.   Distal pulses  are 2+ bilaterally in lower extremities.  Imaging: IR PERCUTANEOUS ART THROMBECTOMY/INFUSION INTRACRANIAL INC DIAG ANGIO  Result Date: 05/11/2022 INDICATION: Worsening left-sided weakness and right gaze deviation. Occluded distal M2 segment of the inferior division of the right middle cerebral artery on CT angiogram of the head and neck. EXAM: 1. EMERGENT LARGE VESSEL OCCLUSION THROMBOLYSIS anterior CIRCULATION) COMPARISON:  CT angiogram of the head and neck of May 10, 2022. MEDICATIONS: Ancef 2 g IV antibiotic was administered within 1 hour of the procedure. ANESTHESIA/SEDATION: General anesthesia. CONTRAST:  Omnipaque 300 approximately 70 mL. FLUOROSCOPY TIME:  Fluoroscopy Time: 41 minutes 16 seconds (2313 mGy). COMPLICATIONS: None immediate. TECHNIQUE: Following a full explanation of the procedure along with the potential associated complications, an informed witnessed consent was obtained. The risks of intracranial hemorrhage of 10%, worsening neurological deficit, ventilator dependency, death and inability to revascularize were all reviewed in detail with the patient's spouse. The patient was then put under general anesthesia by the Department of Anesthesiology at [facility]. The right groin was prepped and draped in the usual sterile fashion. Thereafter using modified Seldinger technique, transfemoral access into the right common femoral artery was obtained without difficulty. Over a 0.035 inch guidewire an 8 French 25 cm Pinnacle sheath was inserted. Through this a  combination of a 125 cm 5 [place] Berenstein support catheter inside of an 087 95 cm balloon guide catheter was advanced initially proximal to the right common carotid bifurcation and then into the distal right internal carotid artery. FINDINGS: The right common carotid bifurcation demonstrates the right external carotid artery and its major branches to be widely patent. The right internal carotid artery at the bulb demonstrates a  smooth shallow plaque along the posterior wall associated with 10% stenosis by the NASCET criteria. Distal to this the right internal carotid artery demonstrates opacification to the cranial skull base. The petrous, the cavernous and the supraclinoid right ICA demonstrate wide patency. A right posterior communicating artery is seen opacifying the right posterior cerebral artery distribution. The right anterior cerebral artery opacifies into the capillary and venous phases. The right middle cerebral artery M1 segment is widely patent. There is an occlusion of a parietal branch of the inferior division of the right middle cerebral artery in the distal M3 region. Delayed arterial and capillary phase demonstrates significant retrograde opacification of the distal right parietal cortical and subcortical distribution from the anterior cerebral and the P3 leptomeningeal branches of the right posterior cerebral artery. PROCEDURE: Through the balloon guide catheter in the distal right internal carotid artery, a combination of an 045 Zoom aspiration catheter with a 160 cm Trevo microcatheter was advanced over an 014 inch soft tip micro guidewire with a moderate J configuration to the supraclinoid right ICA. The micro guidewire was then gently manipulated with a torque device and advanced without difficulty distal to the occluded branch of the inferior division into the M3 region followed by the microcatheter. A 3 mm x 20 mm Solitaire X retrieval device was then deployed following verification of safe positioning of the tip of the microcatheter. This was then followed by the advancement of the Zoom aspiration catheter which was engaged just inside the occluded portion of the vessel, the micro guidewire and the microcatheter were retrieved proximally. Following 2 minutes of contact aspiration with a Penumbra aspiration device and proximal flow arrest, the Zoom aspiration catheter was removed. Two approximately 1 mm pearly  sticky clots were retrieved. A control arteriogram demonstrated no significant change in the occluded parietal branch. A second pass was then made using the above combination. The microcatheter was advanced past the occlusion into the M3 region with the micro guidewire. The guidewire was removed. Good aspiration obtained from the hub of the microcatheter which was then connected to continuous heparinized saline infusion. The Zoom aspiration catheter was then engaged just inside the occluded segment of the vessel. A 3 mm x 20 mm Solitaire X retrieval device was then deployed in the usual manner. Thereafter, constant aspiration was applied with proximal flow arrest the hub of the Zoom aspiration catheter for a minute and a half. Following this the combination of the retrieval device, the microcatheter and the aspiration catheter were removed. A control arteriogram performed through the balloon guide catheter following reversal of flow arrest now demonstrated improved flow through the occluded branch though slow to the M3 M4 regions. A third pass was then made using a combination using an 035 inch aspiration catheter advanced over an 014 inch soft tip micro guidewire with a moderate J configuration to the right middle cerebral artery. Access into the now reoccluded inferior division M2 branch was then made with the micro guidewire followed by the microcatheter into the distal M3 region. The guidewire was removed. The 035 inch Zoom aspiration was then engaged into  the clot with no aspiration obtained. Aspiration was then applied with a Penumbra aspiration device for approximately 2 minutes with proximal flow arrest. The aspiration catheter was removed. A control arteriogram performed through the balloon guide catheter demonstrated mild improvement in revascularization of the occluded branch with the clot noted slightly more distally. A fourth pass was then made this time again using an 045 Zoom aspiration catheter  advanced with an 021 microcatheter and over 014 inch micro guidewire. This combination was then advanced into the M3 region of the occluded branch followed by the microcatheter. The guidewire was removed. Good aspiration obtained from the hub of the microcatheter which was positioned distally in the M3 segment. This was then connected to continuous heparinized saline infusion. A 4 mm x 40 mm Solitaire X retrieval stent was then deployed in the usual manner with the 045 Zoom aspiration catheter imbedded in the occluded branch. Aspiration was then for approximately 2 minutes at the hub of the aspiration catheter with proximal flow arrest. Thereafter, the retriever, the microcatheter and the Zoom aspiration catheter were removed. A control arteriogram performed following reversal of flow arrest in the right internal carotid artery, continued to demonstrate slow antegrade flow distal to the occluded segment. A control arteriogram performed through the balloon guide catheter continued to demonstrate a TICI 2C over revascularization with only mild distal flow past the occluded M2 branch of the right inferior division MCA. No further passes were made due to now steadily increased risk of vessel injury perforation. The balloon guide was retrieved in the right common carotid artery. A control arteriogram performed through this continued to demonstrate patency of right internal carotid artery proximally and distally including that of the right posterior communicating artery, the right anterior cerebral artery and the right middle cerebral artery distributions. An 8 French Angio-Seal closure device was then deployed in the right groin puncture site for hemostasis. Distal pulses remained Dopplerable in both feet unchanged from prior to the procedure. A flat panel CT of the brain demonstrated no evidence of intra cerebral hemorrhage. The patient's general anesthesia was reversed, and patient was extubated. Initially agitated,  the patient gradually settled down to where simple commands appropriately and verbalized appropriately. The patient moved his left lower extremities spontaneously and to command with good strength. Slight movement was evident in the left upper extremity. The patient was then transferred to the neuro ICU for post revascularization management. IMPRESSION: Status post endovascular revascularization of occluded distal M2 branch of the right middle cerebral artery inferior division with 2 passes with a 3 mm x 20 mm retrieval device and contact aspiration, 1 pass with a 4 mm x 40 mm Solitaire X retrieval device and contact aspiration, 1 pass with contact aspiration with minimal improvement in revascularization of the occluded right MCA inferior division M2 segment occlusion. A TICI 2C revascularization was maintained. PLAN: Follow-up as per referring MD. Electronically Signed   By: Luanne Bras M.D.   On: 05/11/2022 08:28   IR CT Head Ltd  Result Date: 05/11/2022 INDICATION: Worsening left-sided weakness and right gaze deviation. Occluded distal M2 segment of the inferior division of the right middle cerebral artery on CT angiogram of the head and neck. EXAM: 1. EMERGENT LARGE VESSEL OCCLUSION THROMBOLYSIS anterior CIRCULATION) COMPARISON:  CT angiogram of the head and neck of May 10, 2022. MEDICATIONS: Ancef 2 g IV antibiotic was administered within 1 hour of the procedure. ANESTHESIA/SEDATION: General anesthesia. CONTRAST:  Omnipaque 300 approximately 70 mL. FLUOROSCOPY TIME:  Fluoroscopy Time:  41 minutes 16 seconds (2313 mGy). COMPLICATIONS: None immediate. TECHNIQUE: Following a full explanation of the procedure along with the potential associated complications, an informed witnessed consent was obtained. The risks of intracranial hemorrhage of 10%, worsening neurological deficit, ventilator dependency, death and inability to revascularize were all reviewed in detail with the patient's spouse. The  patient was then put under general anesthesia by the Department of Anesthesiology at [facility]. The right groin was prepped and draped in the usual sterile fashion. Thereafter using modified Seldinger technique, transfemoral access into the right common femoral artery was obtained without difficulty. Over a 0.035 inch guidewire an 8 French 25 cm Pinnacle sheath was inserted. Through this a combination of a 125 cm 5 [place] Berenstein support catheter inside of an 087 95 cm balloon guide catheter was advanced initially proximal to the right common carotid bifurcation and then into the distal right internal carotid artery. FINDINGS: The right common carotid bifurcation demonstrates the right external carotid artery and its major branches to be widely patent. The right internal carotid artery at the bulb demonstrates a smooth shallow plaque along the posterior wall associated with 10% stenosis by the NASCET criteria. Distal to this the right internal carotid artery demonstrates opacification to the cranial skull base. The petrous, the cavernous and the supraclinoid right ICA demonstrate wide patency. A right posterior communicating artery is seen opacifying the right posterior cerebral artery distribution. The right anterior cerebral artery opacifies into the capillary and venous phases. The right middle cerebral artery M1 segment is widely patent. There is an occlusion of a parietal branch of the inferior division of the right middle cerebral artery in the distal M3 region. Delayed arterial and capillary phase demonstrates significant retrograde opacification of the distal right parietal cortical and subcortical distribution from the anterior cerebral and the P3 leptomeningeal branches of the right posterior cerebral artery. PROCEDURE: Through the balloon guide catheter in the distal right internal carotid artery, a combination of an 045 Zoom aspiration catheter with a 160 cm Trevo microcatheter was advanced  over an 014 inch soft tip micro guidewire with a moderate J configuration to the supraclinoid right ICA. The micro guidewire was then gently manipulated with a torque device and advanced without difficulty distal to the occluded branch of the inferior division into the M3 region followed by the microcatheter. A 3 mm x 20 mm Solitaire X retrieval device was then deployed following verification of safe positioning of the tip of the microcatheter. This was then followed by the advancement of the Zoom aspiration catheter which was engaged just inside the occluded portion of the vessel, the micro guidewire and the microcatheter were retrieved proximally. Following 2 minutes of contact aspiration with a Penumbra aspiration device and proximal flow arrest, the Zoom aspiration catheter was removed. Two approximately 1 mm pearly sticky clots were retrieved. A control arteriogram demonstrated no significant change in the occluded parietal branch. A second pass was then made using the above combination. The microcatheter was advanced past the occlusion into the M3 region with the micro guidewire. The guidewire was removed. Good aspiration obtained from the hub of the microcatheter which was then connected to continuous heparinized saline infusion. The Zoom aspiration catheter was then engaged just inside the occluded segment of the vessel. A 3 mm x 20 mm Solitaire X retrieval device was then deployed in the usual manner. Thereafter, constant aspiration was applied with proximal flow arrest the hub of the Zoom aspiration catheter for a minute and a half.  Following this the combination of the retrieval device, the microcatheter and the aspiration catheter were removed. A control arteriogram performed through the balloon guide catheter following reversal of flow arrest now demonstrated improved flow through the occluded branch though slow to the M3 M4 regions. A third pass was then made using a combination using an 035 inch  aspiration catheter advanced over an 014 inch soft tip micro guidewire with a moderate J configuration to the right middle cerebral artery. Access into the now reoccluded inferior division M2 branch was then made with the micro guidewire followed by the microcatheter into the distal M3 region. The guidewire was removed. The 035 inch Zoom aspiration was then engaged into the clot with no aspiration obtained. Aspiration was then applied with a Penumbra aspiration device for approximately 2 minutes with proximal flow arrest. The aspiration catheter was removed. A control arteriogram performed through the balloon guide catheter demonstrated mild improvement in revascularization of the occluded branch with the clot noted slightly more distally. A fourth pass was then made this time again using an 045 Zoom aspiration catheter advanced with an 021 microcatheter and over 014 inch micro guidewire. This combination was then advanced into the M3 region of the occluded branch followed by the microcatheter. The guidewire was removed. Good aspiration obtained from the hub of the microcatheter which was positioned distally in the M3 segment. This was then connected to continuous heparinized saline infusion. A 4 mm x 40 mm Solitaire X retrieval stent was then deployed in the usual manner with the 045 Zoom aspiration catheter imbedded in the occluded branch. Aspiration was then for approximately 2 minutes at the hub of the aspiration catheter with proximal flow arrest. Thereafter, the retriever, the microcatheter and the Zoom aspiration catheter were removed. A control arteriogram performed following reversal of flow arrest in the right internal carotid artery, continued to demonstrate slow antegrade flow distal to the occluded segment. A control arteriogram performed through the balloon guide catheter continued to demonstrate a TICI 2C over revascularization with only mild distal flow past the occluded M2 branch of the right  inferior division MCA. No further passes were made due to now steadily increased risk of vessel injury perforation. The balloon guide was retrieved in the right common carotid artery. A control arteriogram performed through this continued to demonstrate patency of right internal carotid artery proximally and distally including that of the right posterior communicating artery, the right anterior cerebral artery and the right middle cerebral artery distributions. An 8 French Angio-Seal closure device was then deployed in the right groin puncture site for hemostasis. Distal pulses remained Dopplerable in both feet unchanged from prior to the procedure. A flat panel CT of the brain demonstrated no evidence of intra cerebral hemorrhage. The patient's general anesthesia was reversed, and patient was extubated. Initially agitated, the patient gradually settled down to where simple commands appropriately and verbalized appropriately. The patient moved his left lower extremities spontaneously and to command with good strength. Slight movement was evident in the left upper extremity. The patient was then transferred to the neuro ICU for post revascularization management. IMPRESSION: Status post endovascular revascularization of occluded distal M2 branch of the right middle cerebral artery inferior division with 2 passes with a 3 mm x 20 mm retrieval device and contact aspiration, 1 pass with a 4 mm x 40 mm Solitaire X retrieval device and contact aspiration, 1 pass with contact aspiration with minimal improvement in revascularization of the occluded right MCA inferior division M2 segment  occlusion. A TICI 2C revascularization was maintained. PLAN: Follow-up as per referring MD. Electronically Signed   By: Luanne Bras M.D.   On: 05/11/2022 08:28   MR BRAIN WO CONTRAST  Result Date: 05/10/2022 CLINICAL DATA:  Stroke, follow up EXAM: MRI HEAD WITHOUT CONTRAST MRA HEAD WITHOUT CONTRAST TECHNIQUE: Multiplanar,  multi-echo pulse sequences of the brain and surrounding structures were acquired without intravenous contrast. Angiographic images of the Circle of Willis were acquired using MRA technique without intravenous contrast. COMPARISON:  CTA and CT head from same day. FINDINGS: MRI HEAD FINDINGS Motion limited study. Brain: Acute posterior right MCA territory infarct. Associated edema and regional mass effect without significant midline shift. No mass occupying acute hemorrhage. No mass lesion. No hydrocephalus. Remote infarct in the right cerebellum. Vascular: Detailed below. Skull and upper cervical spine: Normal marrow signal. Sinuses/Orbits: Clear sinuses.  No acute orbital findings. MRA HEAD FINDINGS Severely motion limited study. Anterior circulation: Bilateral intracranial ICAs, proximal ACAs and proximal M1 MCAs appear grossly patent. Essentially nondiagnostic evaluation of the M2 MCAs, including the region of occlusion seen on recent CTA. Posterior circulation: The intradural vertebral arteries, basilar arteries and proximal PCAs are grossly patent. IMPRESSION: 1. Acute right posterior MCA territory infarct. Associated edema without midline shift. 2. Severely motion limited MRA with nondiagnostic evaluation of the right M2 MCA in the region of recently seen occlusion on CTA. A repeat exclude exam may be able to better assess if the patient is able in clinically warranted. Electronically Signed   By: Margaretha Sheffield M.D.   On: 05/10/2022 16:03   MR ANGIO HEAD WO CONTRAST  Result Date: 05/10/2022 CLINICAL DATA:  Stroke, follow up EXAM: MRI HEAD WITHOUT CONTRAST MRA HEAD WITHOUT CONTRAST TECHNIQUE: Multiplanar, multi-echo pulse sequences of the brain and surrounding structures were acquired without intravenous contrast. Angiographic images of the Circle of Willis were acquired using MRA technique without intravenous contrast. COMPARISON:  CTA and CT head from same day. FINDINGS: MRI HEAD FINDINGS Motion  limited study. Brain: Acute posterior right MCA territory infarct. Associated edema and regional mass effect without significant midline shift. No mass occupying acute hemorrhage. No mass lesion. No hydrocephalus. Remote infarct in the right cerebellum. Vascular: Detailed below. Skull and upper cervical spine: Normal marrow signal. Sinuses/Orbits: Clear sinuses.  No acute orbital findings. MRA HEAD FINDINGS Severely motion limited study. Anterior circulation: Bilateral intracranial ICAs, proximal ACAs and proximal M1 MCAs appear grossly patent. Essentially nondiagnostic evaluation of the M2 MCAs, including the region of occlusion seen on recent CTA. Posterior circulation: The intradural vertebral arteries, basilar arteries and proximal PCAs are grossly patent. IMPRESSION: 1. Acute right posterior MCA territory infarct. Associated edema without midline shift. 2. Severely motion limited MRA with nondiagnostic evaluation of the right M2 MCA in the region of recently seen occlusion on CTA. A repeat exclude exam may be able to better assess if the patient is able in clinically warranted. Electronically Signed   By: Margaretha Sheffield M.D.   On: 05/10/2022 16:03   ECHOCARDIOGRAM COMPLETE  Result Date: 05/10/2022    ECHOCARDIOGRAM REPORT   Patient Name:   Edward Maynard Date of Exam: 05/10/2022 Medical Rec #:  [id number]     Height:       71.5 in Accession #:    [id number]    Weight:       223.1 lb Date of Birth:  October 27, 1933    BSA:          2.220 m Patient Age:  88 years      BP:           160/90 mmHg Patient Gender: M             HR:           73 bpm. Exam Location:  Inpatient Procedure: 2D Echo, Cardiac Doppler and Color Doppler Indications:    Stroke I63.9  History:        Patient has prior history of Echocardiogram examinations and                 Patient has no prior history of Echocardiogram examinations.                 Pacemaker, Stroke, Arrythmias:PVC and Tachycardia; Risk                 Factors:Dyslipidemia.   Sonographer:    Ronny Flurry Referring Phys: [phone] ERIC LINDZEN  Sonographer Comments: Image acquisition challenging due to respiratory motion. IMPRESSIONS  1. Left ventricular ejection fraction, by estimation, is 60 to 65%. The left ventricle has normal function. The left ventricle has no regional wall motion abnormalities. Left ventricular diastolic parameters are consistent with Grade II diastolic dysfunction (pseudonormalization).  2. Right ventricular systolic function is normal. The right ventricular size is normal. There is normal pulmonary artery systolic pressure.  3. The mitral valve is normal in structure. No evidence of mitral valve regurgitation. No evidence of mitral stenosis.  4. The aortic valve is calcified. Aortic valve regurgitation is trivial. Mild aortic valve stenosis. Aortic valve area, by VTI measures 1.55 cm. Aortic valve mean gradient measures 19.0 mmHg. Aortic valve Vmax measures 2.94 m/s.  5. The inferior vena cava is normal in size with greater than 50% respiratory variability, suggesting right atrial pressure of 3 mmHg. FINDINGS  Left Ventricle: Left ventricular ejection fraction, by estimation, is 60 to 65%. The left ventricle has normal function. The left ventricle has no regional wall motion abnormalities. The left ventricular internal cavity size was normal in size. There is  no left ventricular hypertrophy. Left ventricular diastolic parameters are consistent with Grade II diastolic dysfunction (pseudonormalization). Indeterminate filling pressures. Right Ventricle: The right ventricular size is normal. No increase in right ventricular wall thickness. Right ventricular systolic function is normal. There is normal pulmonary artery systolic pressure. The tricuspid regurgitant velocity is 1.45 m/s, and  with an assumed right atrial pressure of 3 mmHg, the estimated right ventricular systolic pressure is [id number] mmHg. Left Atrium: Left atrial size was normal in size. Right Atrium:  Right atrial size was normal in size. Pericardium: There is no evidence of pericardial effusion. Mitral Valve: The mitral valve is normal in structure. No evidence of mitral valve regurgitation. No evidence of mitral valve stenosis. Tricuspid Valve: The tricuspid valve is normal in structure. Tricuspid valve regurgitation is trivial. No evidence of tricuspid stenosis. Aortic Valve: The aortic valve is calcified. Aortic valve regurgitation is trivial. Mild aortic stenosis is present. Aortic valve mean gradient measures 19.0 mmHg. Aortic valve peak gradient measures 34.5 mmHg. Aortic valve area, by VTI measures 1.55 cm. Pulmonic Valve: The pulmonic valve was normal in structure. Pulmonic valve regurgitation is not visualized. No evidence of pulmonic stenosis. Aorta: The aortic root is normal in size and structure. Venous: The inferior vena cava is normal in size with greater than 50% respiratory variability, suggesting right atrial pressure of 3 mmHg. IAS/Shunts: No atrial level shunt detected by color flow Doppler.  LEFT VENTRICLE PLAX 2D LVIDd:  4.60 cm   Diastology LVIDs:         3.70 cm   LV e' medial:    7.80 cm/s LV PW:         1.30 cm   LV E/e' medial:  10.4 LV IVS:        1.20 cm   LV e' lateral:   11.60 cm/s LVOT diam:     2.20 cm   LV E/e' lateral: 7.0 LV SV:         89 LV SV Index:   40 LVOT Area:     3.80 cm  RIGHT VENTRICLE             IVC RV S prime:     16.60 cm/s  IVC diam: 2.10 cm TAPSE (M-mode): 1.5 cm LEFT ATRIUM             Index        RIGHT ATRIUM           Index LA diam:        4.60 cm 2.07 cm/m   RA Area:     15.10 cm LA Vol (A2C):   64.7 ml 29.14 ml/m  RA Volume:   31.80 ml  14.32 ml/m LA Vol (A4C):   58.7 ml 26.44 ml/m LA Biplane Vol: 62.9 ml 28.33 ml/m  AORTIC VALVE AV Area (Vmax):    1.61 cm AV Area (Vmean):   1.68 cm AV Area (VTI):     1.55 cm AV Vmax:           293.50 cm/s AV Vmean:          189.800 cm/s AV VTI:            0.570 m AV Peak Grad:      34.5 mmHg AV Mean  Grad:      19.0 mmHg LVOT Vmax:         124.00 cm/s LVOT Vmean:        83.833 cm/s LVOT VTI:          0.233 m LVOT/AV VTI ratio: 0.41  AORTA Ao Root diam: 3.30 cm MITRAL VALVE               TRICUSPID VALVE MV Area (PHT): 4.68 cm    TR Peak grad:   8.4 mmHg MV Decel Time: 162 msec    TR Vmax:        145.00 cm/s MV E velocity: 81.00 cm/s MV A velocity: 65.50 cm/s  SHUNTS MV E/A ratio:  1.24        Systemic VTI:  0.23 m                            Systemic Diam: 2.20 cm Skeet Latch MD Electronically signed by Skeet Latch MD Signature Date/Time: 05/10/2022/10:48:39 AM    Final    CT HEAD CODE STROKE WO CONTRAST  Result Date: 05/10/2022 CLINICAL DATA:  Acute neurologic deficit EXAM: CT HEAD WITHOUT CONTRAST CT CERVICAL SPINE WITHOUT CONTRAST TECHNIQUE: Multidetector CT imaging of the head and cervical spine was performed following the standard protocol without intravenous contrast. Multiplanar CT image reconstructions of the cervical spine were also generated. RADIATION DOSE REDUCTION: This exam was performed according to the departmental dose-optimization program which includes automated exposure control, adjustment of the mA and/or kV according to patient size and/or use of iterative reconstruction technique. COMPARISON:  None Available. FINDINGS: CT HEAD FINDINGS Brain: There  is no mass, hemorrhage or extra-axial collection. Generalized atrophy. Multiple old small vessel infarcts of the cerebellum. There is hypoattenuation of the periventricular white matter, most commonly indicating chronic ischemic microangiopathy. Old right frontal and parietal infarcts. Vascular: No abnormal hyperdensity of the major intracranial arteries or dural venous sinuses. No intracranial atherosclerosis. Skull: The visualized skull base, calvarium and extracranial soft tissues are normal. Sinuses/Orbits: No fluid levels or advanced mucosal thickening of the visualized paranasal sinuses. No mastoid or middle ear effusion. The  orbits are normal. ASPECTS ([place] Stroke Program Early CT Score) - Ganglionic level infarction (caudate, lentiform nuclei, internal capsule, insula, M1-M3 cortex): 7 - Supraganglionic infarction (M4-M6 cortex): 3 Total score (0-10 with 10 being normal): 10 CT CERVICAL SPINE FINDINGS Alignment: No static subluxation. Facets are aligned. Occipital condyles and the lateral masses of C1 and C2 are normally approximated. Skull base and vertebrae: No acute fracture. Soft tissues and spinal canal: No prevertebral fluid or swelling. No visible canal hematoma. Disc levels: No advanced spinal canal or neural foraminal stenosis. Upper chest: No pneumothorax, pulmonary nodule or pleural effusion. Other: Normal visualized paraspinal cervical soft tissues. IMPRESSION: 1. No acute intracranial abnormality. 2. ASPECTS is 10. 3. No acute fracture or static subluxation of the cervical spine. These results were communicated to Dr. Kerney Elbe at 12:24 am on 05/10/2022 by text page via the [facility] messaging system. Electronically Signed   By: Ulyses Jarred M.D.   On: 05/10/2022 00:47   CT C-SPINE NO CHARGE  Result Date: 05/10/2022 CLINICAL DATA:  Acute neurologic deficit EXAM: CT HEAD WITHOUT CONTRAST CT CERVICAL SPINE WITHOUT CONTRAST TECHNIQUE: Multidetector CT imaging of the head and cervical spine was performed following the standard protocol without intravenous contrast. Multiplanar CT image reconstructions of the cervical spine were also generated. RADIATION DOSE REDUCTION: This exam was performed according to the departmental dose-optimization program which includes automated exposure control, adjustment of the mA and/or kV according to patient size and/or use of iterative reconstruction technique. COMPARISON:  None Available. FINDINGS: CT HEAD FINDINGS Brain: There is no mass, hemorrhage or extra-axial collection. Generalized atrophy. Multiple old small vessel infarcts of the cerebellum. There is hypoattenuation of the  periventricular white matter, most commonly indicating chronic ischemic microangiopathy. Old right frontal and parietal infarcts. Vascular: No abnormal hyperdensity of the major intracranial arteries or dural venous sinuses. No intracranial atherosclerosis. Skull: The visualized skull base, calvarium and extracranial soft tissues are normal. Sinuses/Orbits: No fluid levels or advanced mucosal thickening of the visualized paranasal sinuses. No mastoid or middle ear effusion. The orbits are normal. ASPECTS ([place] Stroke Program Early CT Score) - Ganglionic level infarction (caudate, lentiform nuclei, internal capsule, insula, M1-M3 cortex): 7 - Supraganglionic infarction (M4-M6 cortex): 3 Total score (0-10 with 10 being normal): 10 CT CERVICAL SPINE FINDINGS Alignment: No static subluxation. Facets are aligned. Occipital condyles and the lateral masses of C1 and C2 are normally approximated. Skull base and vertebrae: No acute fracture. Soft tissues and spinal canal: No prevertebral fluid or swelling. No visible canal hematoma. Disc levels: No advanced spinal canal or neural foraminal stenosis. Upper chest: No pneumothorax, pulmonary nodule or pleural effusion. Other: Normal visualized paraspinal cervical soft tissues. IMPRESSION: 1. No acute intracranial abnormality. 2. ASPECTS is 10. 3. No acute fracture or static subluxation of the cervical spine. These results were communicated to Dr. Kerney Elbe at 12:24 am on 05/10/2022 by text page via the [facility] messaging system. Electronically Signed   By: Ulyses Jarred M.D.   On: 05/10/2022 00:47  CT ANGIO HEAD NECK W WO CM (CODE STROKE)  Result Date: 05/10/2022 CLINICAL DATA:  Acute neurologic deficit EXAM: CT ANGIOGRAPHY HEAD AND NECK TECHNIQUE: Multidetector CT imaging of the head and neck was performed using the standard protocol during bolus administration of intravenous contrast. Multiplanar CT image reconstructions and MIPs were obtained to evaluate the vascular  anatomy. Carotid stenosis measurements (when applicable) are obtained utilizing NASCET criteria, using the distal internal carotid diameter as the denominator. RADIATION DOSE REDUCTION: This exam was performed according to the departmental dose-optimization program which includes automated exposure control, adjustment of the mA and/or kV according to patient size and/or use of iterative reconstruction technique. COMPARISON:  None Available. FINDINGS: CTA NECK FINDINGS SKELETON: There is no bony spinal canal stenosis. No lytic or blastic lesion. OTHER NECK: 12 mm right parotid nodule (series 7 image 81). UPPER CHEST: No pneumothorax or pleural effusion. No nodules or masses. AORTIC ARCH: There is calcific atherosclerosis of the aortic arch. There is no aneurysm, dissection or hemodynamically significant stenosis of the visualized portion of the aorta. Normal variant aortic arch branching pattern with the left vertebral artery arising independently from the aortic arch. The visualized proximal subclavian arteries are widely patent. RIGHT CAROTID SYSTEM: Atherosclerotic web at the bifurcation. Mixed density plaque with less than 50% stenosis. Widely patent ICA. LEFT CAROTID SYSTEM: Calcific atherosclerosis at the bifurcation without hemodynamically significant stenosis. VERTEBRAL ARTERIES: Right dominant configuration. Both origins are clearly patent. There is no dissection, occlusion or flow-limiting stenosis to the skull base (V1-V3 segments). CTA HEAD FINDINGS POSTERIOR CIRCULATION: --Vertebral arteries: Right V4 segment atherosclerosis with widely maintained patency. --Inferior cerebellar arteries: Normal. --Basilar artery: Normal. --Superior cerebellar arteries: Normal. --Posterior cerebral arteries (PCA): Normal. ANTERIOR CIRCULATION: --Intracranial internal carotid arteries: Atherosclerotic calcification of the internal carotid arteries at the skull base without hemodynamically significant stenosis. --Anterior  cerebral arteries (ACA): Normal. Both A1 segments are present. Patent anterior communicating artery (a-comm). --Middle cerebral arteries (MCA): There is occlusion of the proximal M2 segment inferior division of the right MCA (series 10, image 114). Left MCA is normal. VENOUS SINUSES: As permitted by contrast timing, patent. ANATOMIC VARIANTS: Fetal origin of the right posterior cerebral artery. Review of the MIP images confirms the above findings. IMPRESSION: 1. Occlusion of the proximal M2 segment inferior division of the right MCA. 2. 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 3. Nonspecific 12 mm right parotid nodule. 4. Critical Value/emergent results were called by telephone at the time of interpretation on 05/10/2022 at 12:38 am to provider ERIC [facility] , who verbally acknowledged these results. By Electronically Signed   By: Ulyses Jarred M.D.   On: 05/10/2022 00:39    Labs:  CBC: Recent Labs    09/12/21 1539 05/10/22 0005 05/10/22 0010 05/10/22 0545  WBC 6.1 9.2  --  11.6*  HGB 15.4 16.7 16.7 16.0  HCT 45.0 49.6 49.0 46.7  PLT 160 141*  --  164     COAGS: Recent Labs    05/10/22 0005  INR 2.2*  APTT 39*     BMP: Recent Labs    09/12/21 1539 05/10/22 0005 05/10/22 0010 05/10/22 0545  NA 142 137 139 135  K 4.0 3.5 3.7 3.8  CL 103 103 103 103  CO2 24 21*  --  22  GLUCOSE 104* 158* 157* 183*  BUN 20 20 25* 19  CALCIUM 9.1 9.2  --  8.6*  CREATININE 1.14 1.33* 1.30* 1.43*  GFRNONAA  --  52*  --  48*     LIVER FUNCTION TESTS: Recent Labs    05/10/22 0005  BILITOT 0.4  AST 27  ALT 20  ALKPHOS 93  PROT 7.0  ALBUMIN 4.1     Assessment and Plan: R MCA infarct due to right MCA infarct s/p thrombectomy at M2 with distal migration and only partial thrombus removal achieving TICI2c revascularization Patient with ongoing noted deficits today on the left.   Improvement noted from yesterday, patient now able to move left upper  extremity IR will follow for now.   Electronically Signed: Lura Em, PA-C 05/11/2022, 9:29 AM   I spent a total of 15 Minutes at the the patient's bedside AND on the patient's hospital floor or unit, greater than 50% of which was counseling/coordinating care for R MCA occlusion.

## 2022-05-11 NOTE — Progress Notes (Signed)
Physical Therapy Treatment Patient Details Name: Edward Maynard MRN: [id number] DOB: 03-Sep-1933 Today's Date: 05/11/2022   History of Present Illness Pt is an 89 y.o. male who presented 05/10/22 with L-sided weakness and a fall. Outside window for tPA. 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 PMH: A-fib on Xarelto, HLD, CVA in 05/2013    PT Comments    Patient seen with wife and son present (and son very helpful). Patient continues with left inattention and requires max verbal cues to turn his head and eyes past midline. Initial sitting balance at EOB with mod assist due to strong left lean. Able to work to propping on rt elbow and coming up to midline, with pt able to identify when he was in the middle and self-correct when told he was starting to lean to the left. Progressed to standing with +2 max assist for up to 45 seconds with strong left lean and left knee buckling (at least in part due to inattention to left side). May try stedy next session as pt did better when he was able to pull up on his son's arm with his RUE.     Recommendations for follow up therapy are one component of a multi-disciplinary discharge planning process, led by the attending physician.  Recommendations may be updated based on patient status, additional functional criteria and insurance authorization.  Follow Up Recommendations  Acute inpatient rehab (3hours/day) if family able to provide at least min assist     Assistance Recommended at Discharge Frequent or constant Supervision/Assistance  Patient can return home with the following Two people to help with walking and/or transfers;Two people to help with bathing/dressing/bathroom;Assistance with cooking/housework;Direct supervision/assist for medications management;Direct supervision/assist for financial management;Assist for transportation;Help with stairs or ramp for entrance;Assistance  with feeding   Equipment Recommendations  Other (comment) (TBA)    Recommendations for Other Services       Precautions / Restrictions Precautions Precautions: Fall Precaution Comments: BP goal 120 - 140 within 24 hours of IR; Left hemi; Left homonymous hemianopia Restrictions Weight Bearing Restrictions: No     Mobility  Bed Mobility Overal bed mobility: Needs Assistance Bed Mobility: Supine to Sit, Rolling Rolling: Max assist   Supine to sit: HOB elevated, Mod assist     General bed mobility comments: Pt able to initiate bringing legs towards and off left EOB Pt grasping therapist's hand with his RUE to pull to ascend trunk, poor safety awareness. and proprioception by pt. On return to bed, rolling rt and lt to change linens. max assist to rt; min assist to left    Transfers Overall transfer level: Needs assistance Equipment used: 2 person hand held assist Transfers: Sit to/from Stand Sit to Stand: Max assist, +2 physical assistance, From elevated surface (ICU bed)           General transfer comment: Therapist in front blocking his left knee; First attempt ~1/2 standing with PT only assisting. 2nd-3rd stands using his RUE to pull up on son's arm with PT blocking left knee and assisting with wt-shift to his right (strong left lean).    Ambulation/Gait               General Gait Details: unable   Stairs             Wheelchair Mobility    Modified Rankin (Stroke Patients Only) Modified Rankin (Stroke Patients Only) Pre-Morbid Rankin Score: No symptoms Modified  Rankin: Severe disability     Balance Overall balance assessment: Needs assistance Sitting-balance support: No upper extremity supported, Feet supported Sitting balance-Leahy Scale: Poor Sitting balance - Comments: Static sitting EOB with min guard-minA for balance, needing cues to reduce L lateral and anterior lean, but once provided cues to find midline he could maintain it for >30  seconds Postural control: Left lateral lean Standing balance support: Single extremity supported, Bilateral upper extremity supported, During functional activity Standing balance-Leahy Scale: Poor Standing balance comment: maxAx2 with L knee block and heavy L lateral lean in standing                            Cognition Arousal/Alertness: Awake/alert Behavior During Therapy: Restless, Impulsive (midly) Overall Cognitive Status: Impaired/Different from baseline Area of Impairment: Attention, Memory, Following commands, Safety/judgement, Awareness, Problem solving                   Current Attention Level: Sustained Memory: Decreased short-term memory, Decreased recall of precautions Following Commands: Follows one step commands inconsistently, Follows one step commands with increased time Safety/Judgement: Decreased awareness of safety, Decreased awareness of deficits Awareness: Intellectual Problem Solving: Slow processing, Decreased initiation, Difficulty sequencing, Requires verbal cues, Requires tactile cues General Comments: pt oriented to place and situation although unable to state any of his deficits related to having had a stroke        Exercises Other Exercises Other Exercises: supine assessment of ability to move Left extremities. LUE "alien" as pt can now lift it but has no idea where it is going; able to lift LLE off the bed    General Comments General comments (skin integrity, edema, etc.): Wife and son present. Son very helpful      Pertinent Vitals/Pain Pain Assessment Faces Pain Scale: No hurt    Home Living                          Prior Function            PT Goals (current goals can now be found in the care plan section) Acute Rehab PT Goals Patient Stated Goal: to improve Time For Goal Achievement: 05/24/22 Potential to Achieve Goals: Good Progress towards PT goals: Progressing toward goals    Frequency    Min  4X/week      PT Plan Current plan remains appropriate    Co-evaluation              AM-PAC PT "6 Clicks" Mobility   Outcome Measure  Help needed turning from your back to your side while in a flat bed without using bedrails?: A Lot Help needed moving from lying on your back to sitting on the side of a flat bed without using bedrails?: Total Help needed moving to and from a bed to a chair (including a wheelchair)?: Total Help needed standing up from a chair using your arms (e.g., wheelchair or bedside chair)?: Total Help needed to walk in hospital room?: Total Help needed climbing 3-5 steps with a railing? : Total 6 Click Score: 7    End of Session Equipment Utilized During Treatment: Gait belt Activity Tolerance: Patient tolerated treatment well Patient left: with call bell/phone within reach;in bed;with nursing/sitter in room;with family/visitor present Nurse Communication: Mobility status;Need for lift equipment PT Visit Diagnosis: Unsteadiness on feet (R26.81);Other abnormalities of gait and mobility (R26.89);Muscle weakness (generalized) (M62.81);Difficulty in walking, not elsewhere classified (R26.2);Other  symptoms and signs involving the nervous system (R29.898);Hemiplegia and hemiparesis Hemiplegia - Right/Left: Left Hemiplegia - dominant/non-dominant: Non-dominant Hemiplegia - caused by: Cerebral infarction     Time: 1535-1606 PT Time Calculation (min) (ACUTE ONLY): 31 min  Charges:  $Therapeutic Activity: 8-22 mins $Neuromuscular Re-education: 8-22 mins                      Edward Maynard, PT Acute Rehabilitation Services  Office [phone]    Rexanne Mano 05/11/2022, 4:23 PM

## 2022-05-11 NOTE — Progress Notes (Signed)
Ok to change Zocor 14m to Lipitor 235mdue to the risk of rhabdo with amlodipine per Dr. XuErlinda Hong MiOnnie BoerPharmD, BCIDP, AAHIVP, CPP Infectious Disease Pharmacist 05/11/2022 2:02 PM

## 2022-05-11 NOTE — Progress Notes (Signed)
Inpatient Rehab Admissions Coordinator:   Stopped by patients room to discuss rehab recommendations.  Off unit for MBSS.  Will f/u later.   Shann Medal, PT, DPT Admissions Coordinator [phone] 05/11/22  1:49 PM

## 2022-05-11 NOTE — Progress Notes (Signed)
Modified Barium Swallow Study  Patient Details  Name: Edward Maynard MRN: [id number] Date of Birth: 03/12/1934  Today's Date: 05/11/2022  Modified Barium Swallow completed.  Full report located under Chart Review in the Imaging Section.  History of Present Illness 89 year old male with atrial fibrillation (on Xarelto), prior stroke, presenting with acute onset of left hemiplegia and left hemineglect after sustaining a fall to the left while at home this evening. Underwent thrombectomy   Clinical Impression Pt demonstrates moderate to severe oral dysphagia witn left CN VII weakness and sensory impairment leading to severe anterior spillage with cup sips of liquids. Lingual, buccal and palatal residue present with most trials. Thin residue spills to pharynx post swallow. There is delayed swallow initaition with all liquids. No aspiration of thin occurred until pt attempted consecutive straw sips (PAS 7); however, there is higher risk with thin and severe anterior spillage. Nectar was better controlled orally and with a straw, which reduced oral residue and spillage. Pt unable to masticate solids without dentures and lingual manipulation of bolus very poor. Recommend purees and nectar thick liquids while pt learns strategies and improves awareness. Can quickly upgrade to thin as pt demonstrates awareness and control without severe coughing Factors that may increase risk of adverse event in presence of aspiration ([place] 2021): Limited mobility;Reduced cognitive function  Swallow Evaluation Recommendations Liquid Administration via: Straw;Cup Medication Administration: Whole meds with puree Supervision: Full supervision/cueing for swallowing strategies Swallowing strategies  : Slow rate;Small bites/sips;Minimize environmental distractions;Check for anterior loss Postural changes: Stay upright 30-60 min after meals      Florance Paolillo, Katherene Ponto 05/11/2022,2:19 PM

## 2022-05-12 DIAGNOSIS — I63411 Cerebral infarction due to embolism of right middle cerebral artery: Secondary | ICD-10-CM | POA: Diagnosis not present

## 2022-05-12 DIAGNOSIS — I482 Chronic atrial fibrillation, unspecified: Secondary | ICD-10-CM | POA: Diagnosis not present

## 2022-05-12 LAB — BASIC METABOLIC PANEL
Anion gap: 12 (ref 5–15)
BUN: 19 mg/dL (ref 8–23)
CO2: 23 mmol/L (ref 22–32)
Calcium: 8.7 mg/dL — ABNORMAL LOW (ref 8.9–10.3)
Chloride: 102 mmol/L (ref 98–111)
Creatinine, Ser: 1.03 mg/dL (ref 0.61–1.24)
GFR, Estimated: >60 mL/min (ref >60)
Glucose, Bld: 122 mg/dL — ABNORMAL HIGH (ref 70–99)
Potassium: 3.8 mmol/L (ref 3.5–5.1)
Sodium: 137 mmol/L (ref 135–145)

## 2022-05-12 LAB — CBC
HCT: 43 % (ref 39.0–52.0)
Hemoglobin: 14.5 g/dL (ref 13.0–17.0)
MCH: 33.1 pg (ref 26.0–34.0)
MCHC: 33.7 g/dL (ref 30.0–36.0)
MCV: 98.2 fL (ref 80.0–100.0)
Platelets: 146 10*3/uL — ABNORMAL LOW (ref 150–400)
RBC: 4.38 MIL/uL (ref 4.22–5.81)
RDW: 13.5 % (ref 11.5–15.5)
WBC: 7.4 10*3/uL (ref 4.0–10.5)
nRBC: 0 % (ref 0.0–0.2)

## 2022-05-12 MED ORDER — ZOLPIDEM TARTRATE 5 MG PO TABS
5.0000 mg | ORAL_TABLET | Freq: Every day | ORAL | Status: DC
  Administered 2022-05-12: 5 mg via ORAL
  Filled 2022-05-12: qty 1

## 2022-05-12 NOTE — Progress Notes (Addendum)
Inpatient Rehab Admissions Coordinator:   Left message for daughter to discuss caregiver support.    Addendum: Spoke to pt's daughter, [name], to update on recommendations, goals, and expectations of CIR stay.  We reviewed 3 hrs/day of therapy, estimated length of stay ~3 weeks, with goals of min to mod assist.  Reviewed that pt would require 24/7 care and that could be family, friends, community, or hired caregivers.  We reviewed insurance auth process.  She states they will need some time to discuss as a family. I will f/u with them next week.   Shann Medal, PT, DPT Admissions Coordinator [phone] 05/12/22  1:58 PM

## 2022-05-12 NOTE — Progress Notes (Signed)
Physical Therapy Treatment Patient Details Name: Edward Maynard MRN: [id number] DOB: Jul 28, 1933 Today's Date: 05/12/2022   History of Present Illness Pt is an 89 y.o. male who presented 05/10/22 with L-sided weakness and a fall. Outside window for tPA. 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 PMH: A-fib on Xarelto, HLD, CVA in 05/2013    PT Comments    Patient progressing well towards PT goals. Session focused on standing, functional mobility and finding midline. Continues to demonstrate left inattention, left hemineglect and visual deficits. Able to attend to left environment and body with max cues but not sustain. Requires Mod-Max A of 2 for standing from different surfaces heights with heavy left lateral lean. Able to self correct with visual and verbal cues for short periods. Did well with stedy. Recommend use of stedy for nursing for transfers. Continues to be motivated and a great AIR candidate. Will follow.    Recommendations for follow up therapy are one component of a multi-disciplinary discharge planning process, led by the attending physician.  Recommendations may be updated based on patient status, additional functional criteria and insurance authorization.  Follow Up Recommendations  Acute inpatient rehab (3hours/day)     Assistance Recommended at Discharge Frequent or constant Supervision/Assistance  Patient can return home with the following Two people to help with walking and/or transfers;Two people to help with bathing/dressing/bathroom;Assistance with cooking/housework;Direct supervision/assist for medications management;Direct supervision/assist for financial management;Assist for transportation;Help with stairs or ramp for entrance;Assistance with feeding   Equipment Recommendations  Other (comment) (defer to AIR)    Recommendations for Other Services Rehab consult     Precautions /  Restrictions Precautions Precautions: Fall Precaution Comments: BP goal 120 - 140 within 24 hours of IR; Left hemi; Left homonymous hemianopia Restrictions Weight Bearing Restrictions: No     Mobility  Bed Mobility Overal bed mobility: Needs Assistance Bed Mobility: Supine to Sit     Supine to sit: HOB elevated, Min assist, +2 for physical assistance     General bed mobility comments: Min A to assist LLE off EOB and to bring trunk off HOB. Pt able to grasp therapist's hand with his RUE and pull trunk off bed. Decreased left side awareness and proprioception. Crossing RLE over LLE without awareness when attempting to get up.    Transfers Overall transfer level: Needs assistance Equipment used: Ambulation equipment used Transfers: Sit to/from Stand, Bed to chair/wheelchair/BSC Sit to Stand: Mod assist, Max assist, +2 physical assistance, From elevated surface           General transfer comment: Pt required max cueing and physical assist to manage LUE to grip stedy using visual cues. Unable to maintain consistent grasp on bar even when right hand was placed on top. Increased difficulty with dual tasks as patient demonstrated decreased sitting balance when attempting to attend to left side prior to sit to stand. Stood from [employer], from stedy seats x2, from low chair x1. Transfer via Lift Equipment: Stedy  Ambulation/Gait               General Gait Details: unable   Marine scientist Rankin (Stroke Patients Only) Modified Rankin (Stroke Patients Only) Pre-Morbid Rankin Score: No symptoms Modified Rankin: Severe disability     Balance Overall balance assessment: Needs assistance Sitting-balance support: Bilateral upper extremity supported, Feet supported Sitting balance-Leahy  Scale: Poor Sitting balance - Comments: Static sitting EOB. Left lateral lean present. VC provided to self correct. Pt able to correct sitting posture  with cues although unable to sustain for a long period of time. Postural control: Left lateral lean Standing balance support: Single extremity supported, During functional activity, Reliant on assistive device for balance Standing balance-Leahy Scale: Poor Standing balance comment: Reliant on Stedy to maintain left knee extension. VC provided to lean to right and shift weight. Provided visual while using IV pole. Unable to sustain and demonstrated difficulty with maintaining midline while standing. Able to initiate with verbal/visual cues and sustain for short periods.                            Cognition Arousal/Alertness: Awake/alert Behavior During Therapy: Impulsive Overall Cognitive Status: Impaired/Different from baseline Area of Impairment: Attention, Awareness, Problem solving                     Memory: Decreased short-term memory, Decreased recall of precautions Following Commands: Follows one step commands with increased time, Follows one step commands consistently Safety/Judgement: Decreased awareness of safety, Decreased awareness of deficits     General Comments: Continues to have left visual and physical neglect requiring VC and visual cues to attend.        Exercises      General Comments General comments (skin integrity, edema, etc.): VSS on RA, Sp02 95%      Pertinent Vitals/Pain Pain Assessment Pain Assessment: Faces Faces Pain Scale: Hurts a little bit Pain Location: back Pain Descriptors / Indicators: Sore Pain Intervention(s): Monitored during session    Home Living                          Prior Function            PT Goals (current goals can now be found in the care plan section) Progress towards PT goals: Progressing toward goals    Frequency    Min 4X/week      PT Plan Current plan remains appropriate    Co-evaluation PT/OT/SLP Co-Evaluation/Treatment: Yes Reason for Co-Treatment: Complexity of the  patient's impairments (multi-system involvement);For patient/therapist safety;To address functional/ADL transfers PT goals addressed during session: Mobility/safety with mobility;Balance OT goals addressed during session: Proper use of Adaptive equipment and DME;Strengthening/ROM      AM-PAC PT "6 Clicks" Mobility   Outcome Measure  Help needed turning from your back to your side while in a flat bed without using bedrails?: A Lot Help needed moving from lying on your back to sitting on the side of a flat bed without using bedrails?: Total Help needed moving to and from a bed to a chair (including a wheelchair)?: Total Help needed standing up from a chair using your arms (e.g., wheelchair or bedside chair)?: Total Help needed to walk in hospital room?: Total Help needed climbing 3-5 steps with a railing? : Total 6 Click Score: 7    End of Session Equipment Utilized During Treatment: Gait belt Activity Tolerance: Patient tolerated treatment well Patient left: in chair;with call bell/phone within reach;with chair alarm set Nurse Communication: Mobility status;Need for lift equipment (stedy) PT Visit Diagnosis: Unsteadiness on feet (R26.81);Other abnormalities of gait and mobility (R26.89);Muscle weakness (generalized) (M62.81);Difficulty in walking, not elsewhere classified (R26.2);Other symptoms and signs involving the nervous system (R29.898);Hemiplegia and hemiparesis Hemiplegia - Right/Left: Left Hemiplegia - dominant/non-dominant: Non-dominant Hemiplegia - caused  by: Cerebral infarction     [id number] PT Time Calculation (min) (ACUTE ONLY): 29 min  Charges:  $Therapeutic Activity: 8-22 mins                     Marisa Severin, PT, DPT Acute Rehabilitation Services Secure chat preferred Office [phone]      Edward Maynard 05/12/2022, 12:17 PM

## 2022-05-12 NOTE — Progress Notes (Addendum)
STROKE TEAM PROGRESS NOTE   SUBJECTIVE (INTERVAL HISTORY) No family is at the bedside.  Pt has worked with PT and was sitting in chair. He still has right gaze preference and not able to cross midline. However, AAO x 3, still has left hemiparesis, PT/OT recommend CIR.     OBJECTIVE Temp:  [97.7 F (36.5 C)-99.1 F (37.3 C)] 98.2 F (36.8 C) (02/23 1600) Pulse Rate:  [66-83] 80 (02/23 1700) Cardiac Rhythm: Normal sinus rhythm;Heart block (02/23 1600) Resp:  [6-21] 16 (02/23 1700) BP: (128-169)/(68-93) 160/82 (02/23 1700) SpO2:  [91 %-97 %] 94 % (02/23 1700)  Recent Labs  Lab 05/10/22 0003  GLUCAP 135*   Recent Labs  Lab 05/10/22 0005 05/10/22 0010 05/10/22 0545 05/11/22 1246 05/12/22 0631  NA 137 139 135 135 137  K 3.5 3.7 3.8 3.9 3.8  CL 103 103 103 103 102  CO2 21*  --  '22 27 23  '$ GLUCOSE 158* 157* 183* 123* 122*  BUN 20 25* '19 19 19  '$ CREATININE 1.33* 1.30* 1.43* 1.13 1.03  CALCIUM 9.2  --  8.6* 8.5* 8.7*  PHOS  --   --   --  2.4*  --    Recent Labs  Lab 05/10/22 0005 05/11/22 1246  AST 27  --   ALT 20  --   ALKPHOS 93  --   BILITOT 0.4  --   PROT 7.0  --   ALBUMIN 4.1 3.5   Recent Labs  Lab 05/10/22 0005 05/10/22 0010 05/10/22 0545 05/11/22 1246 05/12/22 0631  WBC 9.2  --  11.6* 8.7 7.4  NEUTROABS 6.6  --  9.2*  --   --   HGB 16.7 16.7 16.0 13.7 14.5  HCT 49.6 49.0 46.7 39.4 43.0  MCV 98.4  --  97.3 97.3 98.2  PLT 141*  --  164 144* 146*   No results for input(s): "CKTOTAL", "CKMB", "CKMBINDEX", "TROPONINI" in the last 168 hours. Recent Labs    05/10/22 0005  LABPROT 24.4*  INR 2.2*   No results for input(s): "COLORURINE", "LABSPEC", "PHURINE", "GLUCOSEU", "HGBUR", "BILIRUBINUR", "KETONESUR", "PROTEINUR", "UROBILINOGEN", "NITRITE", "LEUKOCYTESUR" in the last 72 hours.  Invalid input(s): "APPERANCEUR"     Component Value Date/Time   CHOL 134 05/10/2022 0545   TRIG 190 (H) 05/10/2022 0545   HDL 40 (L) 05/10/2022 0545   CHOLHDL 3.4  05/10/2022 0545   VLDL 38 05/10/2022 0545   LDLCALC 56 05/10/2022 0545   Lab Results  Component Value Date   HGBA1C 5.8 (H) 05/10/2022   No results found for: "LABOPIA", "COCAINSCRNUR", "LABBENZ", "AMPHETMU", "THCU", "LABBARB"  Recent Labs  Lab 05/10/22 0005  ETH <10    I have personally reviewed the radiological images below and agree with the radiology interpretations.  DG Swallowing Func-Speech Pathology  Result Date: 05/11/2022 Table formatting from the original result was not included. Modified Barium Swallow Study Patient Details Name: Edward Maynard MRN: [id number] Date of Birth: 11/25/1933 Today's Date: 05/11/2022 HPI/PMH: HPI: 89 year old male with atrial fibrillation (on Xarelto), prior stroke, presenting with acute onset of left hemiplegia and left hemineglect after sustaining a fall to the left while at home this evening. Underwent thrombectomy Clinical Impression: Clinical Impression: Pt demonstrates moderate to severe oral dysphagia witn left CN VII weakness and sensory impairment leading to severe anterior spillage with cup sips of liquids. Lingual, buccal and palatal residue present with most trials. Thin residue spills to pharynx post swallow. There is delayed swallow initaition with all liquids. No  aspiration of thin occurred until pt attempted consecutive straw sips (PAS 7); however, there is higher risk with thin and severe anterior spillage. Nectar was better controlled orally and with a straw, which reduced oral residue and spillage. Pt unable to masticate solids without dentures and lingual manipulation of bolus very poor. Recommend purees and nectar thick liquids while pt learns strategies and improves awareness. Can quickly upgrade to thin as pt demonstrates awareness and control without severe coughing Factors that may increase risk of adverse event in presence of aspiration ([place] 2021): Factors that may increase risk of adverse event in presence of aspiration  ([place]an [place] 2021): Limited mobility; Reduced cognitive function Recommendations/Plan: Swallowing Evaluation Recommendations Swallowing Evaluation Recommendations Liquid Administration via: Straw; Cup Medication Administration: Whole meds with puree Supervision: Full supervision/cueing for swallowing strategies Swallowing strategies  : Slow rate; Small bites/sips; Minimize environmental distractions; Check for anterior loss Postural changes: Stay upright 30-60 min after meals Treatment Plan Treatment Plan Treatment recommendations: Therapy as outlined in treatment plan below Follow-up recommendations: Acute inpatient rehab (3 hours/day) Functional status assessment: Patient has had a recent decline in their functional status and demonstrates the ability to make significant improvements in function in a reasonable and predictable amount of time. Treatment frequency: Min 2x/week Treatment duration: 2 weeks Interventions: Aspiration precaution training; Diet toleration management by SLP; Trials of upgraded texture/liquids; Patient/family education; Compensatory techniques Recommendations Recommendations for follow up therapy are one component of a multi-disciplinary discharge planning process, led by the attending physician.  Recommendations may be updated based on patient status, additional functional criteria and insurance authorization. Assessment: Orofacial Exam: Orofacial Exam Oral Cavity - Dentition: Dentures, top; Dentures, bottom Anatomy: Anatomy: WFL Thin Liquids: Thin Liquids (Level 0) Thin Liquids : Impaired Bolus delivery method: Cup Thin Liquid - Impairment: Oral Impairment; Pharyngeal impairment Lip Closure: Escape beyond mid-chin Tongue control during bolus hold: Escape to lateral buccal cavity/floor of mouth Bolus transport/lingual motion: Repetitive/disorganized tongue motion Oral residue: Residue collection on oral structures Location of oral residue : Floor of mouth; Lateral sulci; Tongue  Initiation of swallow : Pyriform sinuses Soft palate elevation: No bolus between soft palate (SP)/pharyngeal wall (PW) Laryngeal elevation: Complete superior movement of thyroid cartilage with complete approximation of arytenoids to epiglottic petiole Anterior hyoid excursion: Complete Epiglottic movement: Complete Laryngeal vestibule closure: Complete, no air/contrast in laryngeal vestibule Pharyngeal stripping wave : Present - complete Pharyngeal contraction (A/P view only): N/A Pharyngoesophageal segment opening: Complete distension and complete duration, no obstruction of flow Tongue base retraction: Trace column of contrast or air between tongue base and PPW Pharyngeal residue: Trace residue within or on pharyngeal structures Location of pharyngeal residue: Valleculae Penetration/Aspiration Scale (PAS) score: 7.  Material enters airway, passes BELOW cords and not ejected out despite cough attempt by patient; 1.  Material does not enter airway (aspiration with straw only)  Mildly Thick Liquids: Mildly thick liquids (Level 2, nectar thick) Mildly thick liquids (Level 2, nectar thick): Impaired Bolus delivery method: Cup Mildly Thick Liquid - Impairment: Oral Impairment; Pharyngeal impairment Lip Closure: Escape beyond mid-chin Tongue control during bolus hold: Escape to lateral buccal cavity/floor of mouth Bolus transport/lingual motion: Repetitive/disorganized tongue motion Oral residue: Residue collection on oral structures Location of oral residue : Lateral sulci; Floor of mouth Initiation of swallow : Pyriform sinuses Soft palate elevation: No bolus between soft palate (SP)/pharyngeal wall (PW) Laryngeal elevation: Complete superior movement of thyroid cartilage with complete approximation of arytenoids to epiglottic petiole Anterior hyoid excursion: Complete Epiglottic movement:  Complete Laryngeal vestibule closure: Complete, no air/contrast in laryngeal vestibule Pharyngeal stripping wave : Present -  complete Pharyngeal contraction (A/P view only): Complete; N/A Pharyngoesophageal segment opening: Complete distension and complete duration, no obstruction of flow Tongue base retraction: Trace column of contrast or air between tongue base and PPW Pharyngeal residue: Trace residue within or on pharyngeal structures Location of pharyngeal residue: Valleculae  Moderately Thick Liquids: Moderately thick liquids (Level 3, honey thick) Moderately thick liquids (Level 3, honey thick): Impaired Bolus delivery method: Spoon Moderately Thick Liquid - Impairment: Oral Impairment Lip Closure: Escape from interlabial space or lateral juncture, no extension beyond vermillion border Tongue control during bolus hold: Escape to lateral buccal cavity/floor of mouth Bolus transport/lingual motion: Repetitive/disorganized tongue motion Oral residue: Residue collection on oral structures Location of oral residue : Floor of mouth; Lateral sulci Initiation of swallow : Posterior laryngeal surface of the epiglottis  Puree: Puree Puree: Impaired Puree - Impairment: Oral Impairment Lip Closure: Interlabial escape, no progression to anterior lip Bolus transport/lingual motion: Repetitive/disorganized tongue motion Oral residue: Trace residue lining oral structures Location of oral residue : Tongue Initiation of swallow: Valleculae Solid: Solid Solid: Impaired Solid - Impairment: Oral Impairment Lip Closure: Escape beyond mid-chin Bolus preparation/mastication: Minimal chewing/mashing with majority of bolus unchewed Bolus transport/lingual motion: Repetitive/disorganized tongue motion Oral residue: Majority of bolus remaining Location of oral residue : Lateral sulci Pill: Pill Pill: Not Tested Compensatory Strategies: Compensatory Strategies Compensatory strategies: Yes Straw: Effective Effective Straw: Mildly thick liquid (Level 2, nectar thick) (aspiration with thin)   General Information: Caregiver present: No  Diet Prior to this Study:  NPO   No data recorded  No data recorded  Supplemental O2: None (Room air)   History of Recent Intubation: No  Behavior/Cognition: Alert; Cooperative; Pleasant mood No data recorded Baseline vocal quality/speech: Normal Volitional Cough: Able to elicit Volitional Swallow: Able to elicit No data recorded Goal Planning: Prognosis for improved oropharyngeal function: Good Barriers to Reach Goals: Cognitive deficits No data recorded No data recorded Consulted and agree with results and recommendations: Patient; Family member/caregiver Pain: Pain Assessment Pain Assessment: Faces Faces Pain Scale: 0 Pain Intervention(s): Monitored during session End of Session: Start Time:SLP Start Time (ACUTE ONLY): 1345 Stop Time: SLP Stop Time (ACUTE ONLY): 1405 Time Calculation:SLP Time Calculation (min) (ACUTE ONLY): 20 min Charges: SLP Evaluations $ SLP Speech Visit: 1 Visit SLP Evaluations $BSS Swallow: 1 Procedure $MBS Swallow: 1 Procedure $ SLP EVAL LANGUAGE/SOUND PRODUCTION: 1 Procedure $Swallowing Treatment: 1 Procedure $Speech Treatment for Individual: 1 Procedure SLP visit diagnosis: SLP Visit Diagnosis: Dysphagia, oropharyngeal phase (R13.12) Past Medical History: Past Medical History: Diagnosis Date  High cholesterol   05/2013; no defecits  Hyperlipemia   Left arm weakness   Left hand weakness   Personal history of colonic polyp - adenoma  10/23/2013  Stroke ([place]) 05/2013  MRI on 07/02/13 = Multifocal acute & subacute infarction involving right frontal MCA/ACA & left parietal MCA/PCA watershed areas Past Surgical History: Past Surgical History: Procedure Laterality Date  IR CT HEAD LTD  05/10/2022  IR PERCUTANEOUS ART THROMBECTOMY/INFUSION INTRACRANIAL INC DIAG ANGIO  05/10/2022  LOOP RECORDER IMPLANT N/A 10/31/2013  Procedure: LOOP RECORDER IMPLANT;  Surgeon: Coralyn Mark, MD;  Location: [facility];  Service: Cardiovascular;  Laterality: N/A;  RADIOLOGY WITH ANESTHESIA N/A 05/10/2022  Procedure: IR WITH ANESTHESIA;  Surgeon:  Luanne Bras, MD;  Location: [facility];  Service: Radiology;  Laterality: N/A;  SHOULDER SURGERY Bilateral 2003, 1995 [place], Katherene Ponto 05/11/2022,  2:20 PM  IR PERCUTANEOUS ART THROMBECTOMY/INFUSION INTRACRANIAL INC DIAG ANGIO  Result Date: 05/11/2022 INDICATION: Worsening left-sided weakness and right gaze deviation. Occluded distal M2 segment of the inferior division of the right middle cerebral artery on CT angiogram of the head and neck. EXAM: 1. EMERGENT LARGE VESSEL OCCLUSION THROMBOLYSIS anterior CIRCULATION) COMPARISON:  CT angiogram of the head and neck of May 10, 2022. MEDICATIONS: Ancef 2 g IV antibiotic was administered within 1 hour of the procedure. ANESTHESIA/SEDATION: General anesthesia. CONTRAST:  Omnipaque 300 approximately 70 mL. FLUOROSCOPY TIME:  Fluoroscopy Time: 41 minutes 16 seconds (2313 mGy). COMPLICATIONS: None immediate. TECHNIQUE: Following a full explanation of the procedure along with the potential associated complications, an informed witnessed consent was obtained. The risks of intracranial hemorrhage of 10%, worsening neurological deficit, ventilator dependency, death and inability to revascularize were all reviewed in detail with the patient's spouse. The patient was then put under general anesthesia by the Department of Anesthesiology at [facility]. The right groin was prepped and draped in the usual sterile fashion. Thereafter using modified Seldinger technique, transfemoral access into the right common femoral artery was obtained without difficulty. Over a 0.035 inch guidewire an 8 French 25 cm Pinnacle sheath was inserted. Through this a combination of a 125 cm 5 [place] Berenstein support catheter inside of an 087 95 cm balloon guide catheter was advanced initially proximal to the right common carotid bifurcation and then into the distal right internal carotid artery. FINDINGS: The right common carotid bifurcation demonstrates the right external  carotid artery and its major branches to be widely patent. The right internal carotid artery at the bulb demonstrates a smooth shallow plaque along the posterior wall associated with 10% stenosis by the NASCET criteria. Distal to this the right internal carotid artery demonstrates opacification to the cranial skull base. The petrous, the cavernous and the supraclinoid right ICA demonstrate wide patency. A right posterior communicating artery is seen opacifying the right posterior cerebral artery distribution. The right anterior cerebral artery opacifies into the capillary and venous phases. The right middle cerebral artery M1 segment is widely patent. There is an occlusion of a parietal branch of the inferior division of the right middle cerebral artery in the distal M3 region. Delayed arterial and capillary phase demonstrates significant retrograde opacification of the distal right parietal cortical and subcortical distribution from the anterior cerebral and the P3 leptomeningeal branches of the right posterior cerebral artery. PROCEDURE: Through the balloon guide catheter in the distal right internal carotid artery, a combination of an 045 Zoom aspiration catheter with a 160 cm Trevo microcatheter was advanced over an 014 inch soft tip micro guidewire with a moderate J configuration to the supraclinoid right ICA. The micro guidewire was then gently manipulated with a torque device and advanced without difficulty distal to the occluded branch of the inferior division into the M3 region followed by the microcatheter. A 3 mm x 20 mm Solitaire X retrieval device was then deployed following verification of safe positioning of the tip of the microcatheter. This was then followed by the advancement of the Zoom aspiration catheter which was engaged just inside the occluded portion of the vessel, the micro guidewire and the microcatheter were retrieved proximally. Following 2 minutes of contact aspiration with a Penumbra  aspiration device and proximal flow arrest, the Zoom aspiration catheter was removed. Two approximately 1 mm pearly sticky clots were retrieved. A control arteriogram demonstrated no significant change in the occluded parietal branch. A second pass was then  made using the above combination. The microcatheter was advanced past the occlusion into the M3 region with the micro guidewire. The guidewire was removed. Good aspiration obtained from the hub of the microcatheter which was then connected to continuous heparinized saline infusion. The Zoom aspiration catheter was then engaged just inside the occluded segment of the vessel. A 3 mm x 20 mm Solitaire X retrieval device was then deployed in the usual manner. Thereafter, constant aspiration was applied with proximal flow arrest the hub of the Zoom aspiration catheter for a minute and a half. Following this the combination of the retrieval device, the microcatheter and the aspiration catheter were removed. A control arteriogram performed through the balloon guide catheter following reversal of flow arrest now demonstrated improved flow through the occluded branch though slow to the M3 M4 regions. A third pass was then made using a combination using an 035 inch aspiration catheter advanced over an 014 inch soft tip micro guidewire with a moderate J configuration to the right middle cerebral artery. Access into the now reoccluded inferior division M2 branch was then made with the micro guidewire followed by the microcatheter into the distal M3 region. The guidewire was removed. The 035 inch Zoom aspiration was then engaged into the clot with no aspiration obtained. Aspiration was then applied with a Penumbra aspiration device for approximately 2 minutes with proximal flow arrest. The aspiration catheter was removed. A control arteriogram performed through the balloon guide catheter demonstrated mild improvement in revascularization of the occluded branch with the clot  noted slightly more distally. A fourth pass was then made this time again using an 045 Zoom aspiration catheter advanced with an 021 microcatheter and over 014 inch micro guidewire. This combination was then advanced into the M3 region of the occluded branch followed by the microcatheter. The guidewire was removed. Good aspiration obtained from the hub of the microcatheter which was positioned distally in the M3 segment. This was then connected to continuous heparinized saline infusion. A 4 mm x 40 mm Solitaire X retrieval stent was then deployed in the usual manner with the 045 Zoom aspiration catheter imbedded in the occluded branch. Aspiration was then for approximately 2 minutes at the hub of the aspiration catheter with proximal flow arrest. Thereafter, the retriever, the microcatheter and the Zoom aspiration catheter were removed. A control arteriogram performed following reversal of flow arrest in the right internal carotid artery, continued to demonstrate slow antegrade flow distal to the occluded segment. A control arteriogram performed through the balloon guide catheter continued to demonstrate a TICI 2C over revascularization with only mild distal flow past the occluded M2 branch of the right inferior division MCA. No further passes were made due to now steadily increased risk of vessel injury perforation. The balloon guide was retrieved in the right common carotid artery. A control arteriogram performed through this continued to demonstrate patency of right internal carotid artery proximally and distally including that of the right posterior communicating artery, the right anterior cerebral artery and the right middle cerebral artery distributions. An 8 French Angio-Seal closure device was then deployed in the right groin puncture site for hemostasis. Distal pulses remained Dopplerable in both feet unchanged from prior to the procedure. A flat panel CT of the brain demonstrated no evidence of intra  cerebral hemorrhage. The patient's general anesthesia was reversed, and patient was extubated. Initially agitated, the patient gradually settled down to where simple commands appropriately and verbalized appropriately. The patient moved his left lower extremities spontaneously  and to command with good strength. Slight movement was evident in the left upper extremity. The patient was then transferred to the neuro ICU for post revascularization management. IMPRESSION: Status post endovascular revascularization of occluded distal M2 branch of the right middle cerebral artery inferior division with 2 passes with a 3 mm x 20 mm retrieval device and contact aspiration, 1 pass with a 4 mm x 40 mm Solitaire X retrieval device and contact aspiration, 1 pass with contact aspiration with minimal improvement in revascularization of the occluded right MCA inferior division M2 segment occlusion. A TICI 2C revascularization was maintained. PLAN: Follow-up as per referring MD. Electronically Signed   By: Luanne Bras M.D.   On: 05/11/2022 08:28   IR CT Head Ltd  Result Date: 05/11/2022 INDICATION: Worsening left-sided weakness and right gaze deviation. Occluded distal M2 segment of the inferior division of the right middle cerebral artery on CT angiogram of the head and neck. EXAM: 1. EMERGENT LARGE VESSEL OCCLUSION THROMBOLYSIS anterior CIRCULATION) COMPARISON:  CT angiogram of the head and neck of May 10, 2022. MEDICATIONS: Ancef 2 g IV antibiotic was administered within 1 hour of the procedure. ANESTHESIA/SEDATION: General anesthesia. CONTRAST:  Omnipaque 300 approximately 70 mL. FLUOROSCOPY TIME:  Fluoroscopy Time: 41 minutes 16 seconds (2313 mGy). COMPLICATIONS: None immediate. TECHNIQUE: Following a full explanation of the procedure along with the potential associated complications, an informed witnessed consent was obtained. The risks of intracranial hemorrhage of 10%, worsening neurological deficit,  ventilator dependency, death and inability to revascularize were all reviewed in detail with the patient's spouse. The patient was then put under general anesthesia by the Department of Anesthesiology at [facility]. The right groin was prepped and draped in the usual sterile fashion. Thereafter using modified Seldinger technique, transfemoral access into the right common femoral artery was obtained without difficulty. Over a 0.035 inch guidewire an 8 French 25 cm Pinnacle sheath was inserted. Through this a combination of a 125 cm 5 [place] Berenstein support catheter inside of an 087 95 cm balloon guide catheter was advanced initially proximal to the right common carotid bifurcation and then into the distal right internal carotid artery. FINDINGS: The right common carotid bifurcation demonstrates the right external carotid artery and its major branches to be widely patent. The right internal carotid artery at the bulb demonstrates a smooth shallow plaque along the posterior wall associated with 10% stenosis by the NASCET criteria. Distal to this the right internal carotid artery demonstrates opacification to the cranial skull base. The petrous, the cavernous and the supraclinoid right ICA demonstrate wide patency. A right posterior communicating artery is seen opacifying the right posterior cerebral artery distribution. The right anterior cerebral artery opacifies into the capillary and venous phases. The right middle cerebral artery M1 segment is widely patent. There is an occlusion of a parietal branch of the inferior division of the right middle cerebral artery in the distal M3 region. Delayed arterial and capillary phase demonstrates significant retrograde opacification of the distal right parietal cortical and subcortical distribution from the anterior cerebral and the P3 leptomeningeal branches of the right posterior cerebral artery. PROCEDURE: Through the balloon guide catheter in the distal right  internal carotid artery, a combination of an 045 Zoom aspiration catheter with a 160 cm Trevo microcatheter was advanced over an 014 inch soft tip micro guidewire with a moderate J configuration to the supraclinoid right ICA. The micro guidewire was then gently manipulated with a torque device and advanced without difficulty distal to  the occluded branch of the inferior division into the M3 region followed by the microcatheter. A 3 mm x 20 mm Solitaire X retrieval device was then deployed following verification of safe positioning of the tip of the microcatheter. This was then followed by the advancement of the Zoom aspiration catheter which was engaged just inside the occluded portion of the vessel, the micro guidewire and the microcatheter were retrieved proximally. Following 2 minutes of contact aspiration with a Penumbra aspiration device and proximal flow arrest, the Zoom aspiration catheter was removed. Two approximately 1 mm pearly sticky clots were retrieved. A control arteriogram demonstrated no significant change in the occluded parietal branch. A second pass was then made using the above combination. The microcatheter was advanced past the occlusion into the M3 region with the micro guidewire. The guidewire was removed. Good aspiration obtained from the hub of the microcatheter which was then connected to continuous heparinized saline infusion. The Zoom aspiration catheter was then engaged just inside the occluded segment of the vessel. A 3 mm x 20 mm Solitaire X retrieval device was then deployed in the usual manner. Thereafter, constant aspiration was applied with proximal flow arrest the hub of the Zoom aspiration catheter for a minute and a half. Following this the combination of the retrieval device, the microcatheter and the aspiration catheter were removed. A control arteriogram performed through the balloon guide catheter following reversal of flow arrest now demonstrated improved flow through the  occluded branch though slow to the M3 M4 regions. A third pass was then made using a combination using an 035 inch aspiration catheter advanced over an 014 inch soft tip micro guidewire with a moderate J configuration to the right middle cerebral artery. Access into the now reoccluded inferior division M2 branch was then made with the micro guidewire followed by the microcatheter into the distal M3 region. The guidewire was removed. The 035 inch Zoom aspiration was then engaged into the clot with no aspiration obtained. Aspiration was then applied with a Penumbra aspiration device for approximately 2 minutes with proximal flow arrest. The aspiration catheter was removed. A control arteriogram performed through the balloon guide catheter demonstrated mild improvement in revascularization of the occluded branch with the clot noted slightly more distally. A fourth pass was then made this time again using an 045 Zoom aspiration catheter advanced with an 021 microcatheter and over 014 inch micro guidewire. This combination was then advanced into the M3 region of the occluded branch followed by the microcatheter. The guidewire was removed. Good aspiration obtained from the hub of the microcatheter which was positioned distally in the M3 segment. This was then connected to continuous heparinized saline infusion. A 4 mm x 40 mm Solitaire X retrieval stent was then deployed in the usual manner with the 045 Zoom aspiration catheter imbedded in the occluded branch. Aspiration was then for approximately 2 minutes at the hub of the aspiration catheter with proximal flow arrest. Thereafter, the retriever, the microcatheter and the Zoom aspiration catheter were removed. A control arteriogram performed following reversal of flow arrest in the right internal carotid artery, continued to demonstrate slow antegrade flow distal to the occluded segment. A control arteriogram performed through the balloon guide catheter continued to  demonstrate a TICI 2C over revascularization with only mild distal flow past the occluded M2 branch of the right inferior division MCA. No further passes were made due to now steadily increased risk of vessel injury perforation. The balloon guide was retrieved in the right  common carotid artery. A control arteriogram performed through this continued to demonstrate patency of right internal carotid artery proximally and distally including that of the right posterior communicating artery, the right anterior cerebral artery and the right middle cerebral artery distributions. An 8 French Angio-Seal closure device was then deployed in the right groin puncture site for hemostasis. Distal pulses remained Dopplerable in both feet unchanged from prior to the procedure. A flat panel CT of the brain demonstrated no evidence of intra cerebral hemorrhage. The patient's general anesthesia was reversed, and patient was extubated. Initially agitated, the patient gradually settled down to where simple commands appropriately and verbalized appropriately. The patient moved his left lower extremities spontaneously and to command with good strength. Slight movement was evident in the left upper extremity. The patient was then transferred to the neuro ICU for post revascularization management. IMPRESSION: Status post endovascular revascularization of occluded distal M2 branch of the right middle cerebral artery inferior division with 2 passes with a 3 mm x 20 mm retrieval device and contact aspiration, 1 pass with a 4 mm x 40 mm Solitaire X retrieval device and contact aspiration, 1 pass with contact aspiration with minimal improvement in revascularization of the occluded right MCA inferior division M2 segment occlusion. A TICI 2C revascularization was maintained. PLAN: Follow-up as per referring MD. Electronically Signed   By: Luanne Bras M.D.   On: 05/11/2022 08:28   MR BRAIN WO CONTRAST  Result Date: 05/10/2022 CLINICAL  DATA:  Stroke, follow up EXAM: MRI HEAD WITHOUT CONTRAST MRA HEAD WITHOUT CONTRAST TECHNIQUE: Multiplanar, multi-echo pulse sequences of the brain and surrounding structures were acquired without intravenous contrast. Angiographic images of the Circle of Willis were acquired using MRA technique without intravenous contrast. COMPARISON:  CTA and CT head from same day. FINDINGS: MRI HEAD FINDINGS Motion limited study. Brain: Acute posterior right MCA territory infarct. Associated edema and regional mass effect without significant midline shift. No mass occupying acute hemorrhage. No mass lesion. No hydrocephalus. Remote infarct in the right cerebellum. Vascular: Detailed below. Skull and upper cervical spine: Normal marrow signal. Sinuses/Orbits: Clear sinuses.  No acute orbital findings. MRA HEAD FINDINGS Severely motion limited study. Anterior circulation: Bilateral intracranial ICAs, proximal ACAs and proximal M1 MCAs appear grossly patent. Essentially nondiagnostic evaluation of the M2 MCAs, including the region of occlusion seen on recent CTA. Posterior circulation: The intradural vertebral arteries, basilar arteries and proximal PCAs are grossly patent. IMPRESSION: 1. Acute right posterior MCA territory infarct. Associated edema without midline shift. 2. Severely motion limited MRA with nondiagnostic evaluation of the right M2 MCA in the region of recently seen occlusion on CTA. A repeat exclude exam may be able to better assess if the patient is able in clinically warranted. Electronically Signed   By: Margaretha Sheffield M.D.   On: 05/10/2022 16:03   MR ANGIO HEAD WO CONTRAST  Result Date: 05/10/2022 CLINICAL DATA:  Stroke, follow up EXAM: MRI HEAD WITHOUT CONTRAST MRA HEAD WITHOUT CONTRAST TECHNIQUE: Multiplanar, multi-echo pulse sequences of the brain and surrounding structures were acquired without intravenous contrast. Angiographic images of the Circle of Willis were acquired using MRA technique without  intravenous contrast. COMPARISON:  CTA and CT head from same day. FINDINGS: MRI HEAD FINDINGS Motion limited study. Brain: Acute posterior right MCA territory infarct. Associated edema and regional mass effect without significant midline shift. No mass occupying acute hemorrhage. No mass lesion. No hydrocephalus. Remote infarct in the right cerebellum. Vascular: Detailed below. Skull and upper cervical spine: Normal  marrow signal. Sinuses/Orbits: Clear sinuses.  No acute orbital findings. MRA HEAD FINDINGS Severely motion limited study. Anterior circulation: Bilateral intracranial ICAs, proximal ACAs and proximal M1 MCAs appear grossly patent. Essentially nondiagnostic evaluation of the M2 MCAs, including the region of occlusion seen on recent CTA. Posterior circulation: The intradural vertebral arteries, basilar arteries and proximal PCAs are grossly patent. IMPRESSION: 1. Acute right posterior MCA territory infarct. Associated edema without midline shift. 2. Severely motion limited MRA with nondiagnostic evaluation of the right M2 MCA in the region of recently seen occlusion on CTA. A repeat exclude exam may be able to better assess if the patient is able in clinically warranted. Electronically Signed   By: Margaretha Sheffield M.D.   On: 05/10/2022 16:03   ECHOCARDIOGRAM COMPLETE  Result Date: 05/10/2022    ECHOCARDIOGRAM REPORT   Patient Name:   Edward Maynard Date of Exam: 05/10/2022 Medical Rec #:  [id number]     Height:       71.5 in Accession #:    [id number]    Weight:       223.1 lb Date of Birth:  01-10-1934    BSA:          2.220 m Patient Age:    37 years      BP:           160/90 mmHg Patient Gender: M             HR:           73 bpm. Exam Location:  Inpatient Procedure: 2D Echo, Cardiac Doppler and Color Doppler Indications:    Stroke I63.9  History:        Patient has prior history of Echocardiogram examinations and                 Patient has no prior history of Echocardiogram examinations.                  Pacemaker, Stroke, Arrythmias:PVC and Tachycardia; Risk                 Factors:Dyslipidemia.  Sonographer:    Ronny Flurry Referring Phys: [phone] ERIC LINDZEN  Sonographer Comments: Image acquisition challenging due to respiratory motion. IMPRESSIONS  1. Left ventricular ejection fraction, by estimation, is 60 to 65%. The left ventricle has normal function. The left ventricle has no regional wall motion abnormalities. Left ventricular diastolic parameters are consistent with Grade II diastolic dysfunction (pseudonormalization).  2. Right ventricular systolic function is normal. The right ventricular size is normal. There is normal pulmonary artery systolic pressure.  3. The mitral valve is normal in structure. No evidence of mitral valve regurgitation. No evidence of mitral stenosis.  4. The aortic valve is calcified. Aortic valve regurgitation is trivial. Mild aortic valve stenosis. Aortic valve area, by VTI measures 1.55 cm. Aortic valve mean gradient measures 19.0 mmHg. Aortic valve Vmax measures 2.94 m/s.  5. The inferior vena cava is normal in size with greater than 50% respiratory variability, suggesting right atrial pressure of 3 mmHg. FINDINGS  Left Ventricle: Left ventricular ejection fraction, by estimation, is 60 to 65%. The left ventricle has normal function. The left ventricle has no regional wall motion abnormalities. The left ventricular internal cavity size was normal in size. There is  no left ventricular hypertrophy. Left ventricular diastolic parameters are consistent with Grade II diastolic dysfunction (pseudonormalization). Indeterminate filling pressures. Right Ventricle: The right ventricular size is normal. No increase in right ventricular wall  thickness. Right ventricular systolic function is normal. There is normal pulmonary artery systolic pressure. The tricuspid regurgitant velocity is 1.45 m/s, and  with an assumed right atrial pressure of 3 mmHg, the estimated right  ventricular systolic pressure is [id number] mmHg. Left Atrium: Left atrial size was normal in size. Right Atrium: Right atrial size was normal in size. Pericardium: There is no evidence of pericardial effusion. Mitral Valve: The mitral valve is normal in structure. No evidence of mitral valve regurgitation. No evidence of mitral valve stenosis. Tricuspid Valve: The tricuspid valve is normal in structure. Tricuspid valve regurgitation is trivial. No evidence of tricuspid stenosis. Aortic Valve: The aortic valve is calcified. Aortic valve regurgitation is trivial. Mild aortic stenosis is present. Aortic valve mean gradient measures 19.0 mmHg. Aortic valve peak gradient measures 34.5 mmHg. Aortic valve area, by VTI measures 1.55 cm. Pulmonic Valve: The pulmonic valve was normal in structure. Pulmonic valve regurgitation is not visualized. No evidence of pulmonic stenosis. Aorta: The aortic root is normal in size and structure. Venous: The inferior vena cava is normal in size with greater than 50% respiratory variability, suggesting right atrial pressure of 3 mmHg. IAS/Shunts: No atrial level shunt detected by color flow Doppler.  LEFT VENTRICLE PLAX 2D LVIDd:         4.60 cm   Diastology LVIDs:         3.70 cm   LV e' medial:    7.80 cm/s LV PW:         1.30 cm   LV E/e' medial:  10.4 LV IVS:        1.20 cm   LV e' lateral:   11.60 cm/s LVOT diam:     2.20 cm   LV E/e' lateral: 7.0 LV SV:         89 LV SV Index:   40 LVOT Area:     3.80 cm  RIGHT VENTRICLE             IVC RV S prime:     16.60 cm/s  IVC diam: 2.10 cm TAPSE (M-mode): 1.5 cm LEFT ATRIUM             Index        RIGHT ATRIUM           Index LA diam:        4.60 cm 2.07 cm/m   RA Area:     15.10 cm LA Vol (A2C):   64.7 ml 29.14 ml/m  RA Volume:   31.80 ml  14.32 ml/m LA Vol (A4C):   58.7 ml 26.44 ml/m LA Biplane Vol: 62.9 ml 28.33 ml/m  AORTIC VALVE AV Area (Vmax):    1.61 cm AV Area (Vmean):   1.68 cm AV Area (VTI):     1.55 cm AV Vmax:            293.50 cm/s AV Vmean:          189.800 cm/s AV VTI:            0.570 m AV Peak Grad:      34.5 mmHg AV Mean Grad:      19.0 mmHg LVOT Vmax:         124.00 cm/s LVOT Vmean:        83.833 cm/s LVOT VTI:          0.233 m LVOT/AV VTI ratio: 0.41  AORTA Ao Root diam: 3.30 cm MITRAL VALVE  TRICUSPID VALVE MV Area (PHT): 4.68 cm    TR Peak grad:   8.4 mmHg MV Decel Time: 162 msec    TR Vmax:        145.00 cm/s MV E velocity: 81.00 cm/s MV A velocity: 65.50 cm/s  SHUNTS MV E/A ratio:  1.24        Systemic VTI:  0.23 m                            Systemic Diam: 2.20 cm Skeet Latch MD Electronically signed by Skeet Latch MD Signature Date/Time: 05/10/2022/10:48:39 AM    Final    CT HEAD CODE STROKE WO CONTRAST  Result Date: 05/10/2022 CLINICAL DATA:  Acute neurologic deficit EXAM: CT HEAD WITHOUT CONTRAST CT CERVICAL SPINE WITHOUT CONTRAST TECHNIQUE: Multidetector CT imaging of the head and cervical spine was performed following the standard protocol without intravenous contrast. Multiplanar CT image reconstructions of the cervical spine were also generated. RADIATION DOSE REDUCTION: This exam was performed according to the departmental dose-optimization program which includes automated exposure control, adjustment of the mA and/or kV according to patient size and/or use of iterative reconstruction technique. COMPARISON:  None Available. FINDINGS: CT HEAD FINDINGS Brain: There is no mass, hemorrhage or extra-axial collection. Generalized atrophy. Multiple old small vessel infarcts of the cerebellum. There is hypoattenuation of the periventricular white matter, most commonly indicating chronic ischemic microangiopathy. Old right frontal and parietal infarcts. Vascular: No abnormal hyperdensity of the major intracranial arteries or dural venous sinuses. No intracranial atherosclerosis. Skull: The visualized skull base, calvarium and extracranial soft tissues are normal. Sinuses/Orbits: No fluid levels  or advanced mucosal thickening of the visualized paranasal sinuses. No mastoid or middle ear effusion. The orbits are normal. ASPECTS ([place] Stroke Program Early CT Score) - Ganglionic level infarction (caudate, lentiform nuclei, internal capsule, insula, M1-M3 cortex): 7 - Supraganglionic infarction (M4-M6 cortex): 3 Total score (0-10 with 10 being normal): 10 CT CERVICAL SPINE FINDINGS Alignment: No static subluxation. Facets are aligned. Occipital condyles and the lateral masses of C1 and C2 are normally approximated. Skull base and vertebrae: No acute fracture. Soft tissues and spinal canal: No prevertebral fluid or swelling. No visible canal hematoma. Disc levels: No advanced spinal canal or neural foraminal stenosis. Upper chest: No pneumothorax, pulmonary nodule or pleural effusion. Other: Normal visualized paraspinal cervical soft tissues. IMPRESSION: 1. No acute intracranial abnormality. 2. ASPECTS is 10. 3. No acute fracture or static subluxation of the cervical spine. These results were communicated to Dr. Kerney Elbe at 12:24 am on 05/10/2022 by text page via the [facility] messaging system. Electronically Signed   By: Ulyses Jarred M.D.   On: 05/10/2022 00:47   CT C-SPINE NO CHARGE  Result Date: 05/10/2022 CLINICAL DATA:  Acute neurologic deficit EXAM: CT HEAD WITHOUT CONTRAST CT CERVICAL SPINE WITHOUT CONTRAST TECHNIQUE: Multidetector CT imaging of the head and cervical spine was performed following the standard protocol without intravenous contrast. Multiplanar CT image reconstructions of the cervical spine were also generated. RADIATION DOSE REDUCTION: This exam was performed according to the departmental dose-optimization program which includes automated exposure control, adjustment of the mA and/or kV according to patient size and/or use of iterative reconstruction technique. COMPARISON:  None Available. FINDINGS: CT HEAD FINDINGS Brain: There is no mass, hemorrhage or extra-axial collection.  Generalized atrophy. Multiple old small vessel infarcts of the cerebellum. There is hypoattenuation of the periventricular white matter, most commonly indicating chronic ischemic microangiopathy. Old right  frontal and parietal infarcts. Vascular: No abnormal hyperdensity of the major intracranial arteries or dural venous sinuses. No intracranial atherosclerosis. Skull: The visualized skull base, calvarium and extracranial soft tissues are normal. Sinuses/Orbits: No fluid levels or advanced mucosal thickening of the visualized paranasal sinuses. No mastoid or middle ear effusion. The orbits are normal. ASPECTS ([place] Stroke Program Early CT Score) - Ganglionic level infarction (caudate, lentiform nuclei, internal capsule, insula, M1-M3 cortex): 7 - Supraganglionic infarction (M4-M6 cortex): 3 Total score (0-10 with 10 being normal): 10 CT CERVICAL SPINE FINDINGS Alignment: No static subluxation. Facets are aligned. Occipital condyles and the lateral masses of C1 and C2 are normally approximated. Skull base and vertebrae: No acute fracture. Soft tissues and spinal canal: No prevertebral fluid or swelling. No visible canal hematoma. Disc levels: No advanced spinal canal or neural foraminal stenosis. Upper chest: No pneumothorax, pulmonary nodule or pleural effusion. Other: Normal visualized paraspinal cervical soft tissues. IMPRESSION: 1. No acute intracranial abnormality. 2. ASPECTS is 10. 3. No acute fracture or static subluxation of the cervical spine. These results were communicated to Dr. Kerney Elbe at 12:24 am on 05/10/2022 by text page via the [facility] messaging system. Electronically Signed   By: Ulyses Jarred M.D.   On: 05/10/2022 00:47   CT ANGIO HEAD NECK W WO CM (CODE STROKE)  Result Date: 05/10/2022 CLINICAL DATA:  Acute neurologic deficit EXAM: CT ANGIOGRAPHY HEAD AND NECK TECHNIQUE: Multidetector CT imaging of the head and neck was performed using the standard protocol during bolus administration of  intravenous contrast. Multiplanar CT image reconstructions and MIPs were obtained to evaluate the vascular anatomy. Carotid stenosis measurements (when applicable) are obtained utilizing NASCET criteria, using the distal internal carotid diameter as the denominator. RADIATION DOSE REDUCTION: This exam was performed according to the departmental dose-optimization program which includes automated exposure control, adjustment of the mA and/or kV according to patient size and/or use of iterative reconstruction technique. COMPARISON:  None Available. FINDINGS: CTA NECK FINDINGS SKELETON: There is no bony spinal canal stenosis. No lytic or blastic lesion. OTHER NECK: 12 mm right parotid nodule (series 7 image 81). UPPER CHEST: No pneumothorax or pleural effusion. No nodules or masses. AORTIC ARCH: There is calcific atherosclerosis of the aortic arch. There is no aneurysm, dissection or hemodynamically significant stenosis of the visualized portion of the aorta. Normal variant aortic arch branching pattern with the left vertebral artery arising independently from the aortic arch. The visualized proximal subclavian arteries are widely patent. RIGHT CAROTID SYSTEM: Atherosclerotic web at the bifurcation. Mixed density plaque with less than 50% stenosis. Widely patent ICA. LEFT CAROTID SYSTEM: Calcific atherosclerosis at the bifurcation without hemodynamically significant stenosis. VERTEBRAL ARTERIES: Right dominant configuration. Both origins are clearly patent. There is no dissection, occlusion or flow-limiting stenosis to the skull base (V1-V3 segments). CTA HEAD FINDINGS POSTERIOR CIRCULATION: --Vertebral arteries: Right V4 segment atherosclerosis with widely maintained patency. --Inferior cerebellar arteries: Normal. --Basilar artery: Normal. --Superior cerebellar arteries: Normal. --Posterior cerebral arteries (PCA): Normal. ANTERIOR CIRCULATION: --Intracranial internal carotid arteries: Atherosclerotic calcification of  the internal carotid arteries at the skull base without hemodynamically significant stenosis. --Anterior cerebral arteries (ACA): Normal. Both A1 segments are present. Patent anterior communicating artery (a-comm). --Middle cerebral arteries (MCA): There is occlusion of the proximal M2 segment inferior division of the right MCA (series 10, image 114). Left MCA is normal. VENOUS SINUSES: As permitted by contrast timing, patent. ANATOMIC VARIANTS: Fetal origin of the right posterior cerebral artery. Review of the MIP images confirms the above  findings. IMPRESSION: 1. Occlusion of the proximal M2 segment inferior division of the right MCA. 2. 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 3. Nonspecific 12 mm right parotid nodule. 4. Critical Value/emergent results were called by telephone at the time of interpretation on 05/10/2022 at 12:38 am to provider ERIC [facility] , who verbally acknowledged these results. By Electronically Signed   By: Ulyses Jarred M.D.   On: 05/10/2022 00:39     PHYSICAL EXAM  Temp:  [97.7 F (36.5 C)-99.1 F (37.3 C)] 98.2 F (36.8 C) (02/23 1600) Pulse Rate:  [66-83] 80 (02/23 1700) Resp:  [6-21] 16 (02/23 1700) BP: (128-169)/(68-93) 160/82 (02/23 1700) SpO2:  [91 %-97 %] 94 % (02/23 1700)  General - Well nourished, well developed, in no apparent distress.  Ophthalmologic - fundi not visualized due to noncooperation.  Cardiovascular - irregularly irregular heart rate and rhythm.  Neuro - awake, alert, eyes open, orientated to age, place, time. No aphasia but moderate dysarthria, following all simple commands. Able to name and repeat with dysarthric voice. Right gaze preference and barely cross midline, tracking on the right visual field, not blinking to visual threat on the left. Left facial droop. Tongue protrusion to the left. LUE deltoid 3-/5, bicep and tricep 3+/5, finger grip 0/5. Left LE proximal 3/5, distal 4/5. RUE 5/5 and RLE 5/5.  Left sensory decreased with neglect. Right FTN intact. Gait not tested.    ASSESSMENT/PLAN Edward Maynard is a 89 y.o. male with history of A-fib on Xarelto, hyperlipidemia, stroke in 05/2013 admitted for left-sided weakness and he fell at home. No tPA given due to outside window.    Stroke:  right large MCA infarct due to right M2 occlusion status post IR with  [id number], embolic secondary to A-fib given on Xarelto  CT no acute abnormality CTA head and neck right M2 proximal occlusion, right ICA bulb atherosclerosis IR with right M2 distal occlusion. S/p 4 passes, TICI2c  MRI large right MCA infarct MRA persistent right MCA occlusion although motion degraded 2D Echo EF 60 to 65% LDL 56 HgbA1c 5.8 Heparin subcu for VTE prophylaxis Xarelto (rivaroxaban) daily prior to admission, now on ASA 325 given large size of infarct. Will switch to eliquis on 2/27 if neuro stable.  Ongoing aggressive stroke risk factor management Therapy recommendations: CIR Disposition: Pending  History of stroke 05/2013 admitted for left-sided weakness and imbalance.  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  MRA/TTE/carotid Doppler negative.  Discharged on aspirin and Zocor. Outpatient 30-day CardioNet monitoring negative and loop recorder placed.  Found to have A-fib in 12/2014, started on Xarelto.  Mild left sided residual at baseline.  Chronic A-fib On Xarelto at home Currently [facility] on hold given large size of infarct Now on ASA 325 will switch to Eliquis on 2/27 if neuro stable.   Hypertension Home meds: toprol 12.5 Stable on the high end BP goal < 180/105 Off cleviprex Put on amlodipine and increase home toprol from 12.5 to 25 Long term BP goal normotensive  Hyperlipidemia Home meds: Zocor 40 LDL 56, goal < 70 Now on lipitor 20 (zocor 40 equivalent, as amlodipine can increase the risk of rhabdo when given with Zocor )  No high intensity statin given LDL at goal Continue  statin at discharge  Dysphagia  Pass swallow On dys 1 and nectar thick liquid Decrease IVF Encourage po intake  Other Stroke Risk Factors Advanced age Obesity, Body mass index is  30.68 kg/m.   Other Active Problems CKD 3A, creatinine 1.30-1.43-1.13-1.03 Mild leukocytosis, WBC 11.6-8.7-7.4 Insomnia - put on Ambien Qhs  Hospital day # 2   Rosalin Hawking, MD PhD Stroke Neurology 05/12/2022 5:58 PM    To contact Stroke Continuity provider, please refer to [email or web address] After hours, contact General Neurology

## 2022-05-12 NOTE — Progress Notes (Signed)
Occupational Therapy Treatment Patient Details Name: Edward Maynard MRN: [id number] DOB: 03/19/1934 Today's Date: 05/12/2022   History of present illness Pt is an 89 y.o. male who presented 05/10/22 with L-sided weakness and a fall. Outside window for tPA. 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 PMH: A-fib on Xarelto, HLD, CVA in 05/2013   OT comments  Pt motivated to work with therapy this AM. Continues to verbalize no sensation on Left side. Left side visual and physical neglect still present during session requiring max verbal and visual cues to attend to left side of body and environment. Utilized stedy to complete functional transfer from bed to recliner using Mod-max X2. Pt able to carry over VC to correct left lateral lean although unable to sustain for an extended amount of time (~15-20 seconds). Pt transferred to recliner positioned to have TV in central line of vision.    Recommendations for follow up therapy are one component of a multi-disciplinary discharge planning process, led by the attending physician.  Recommendations may be updated based on patient status, additional functional criteria and insurance authorization.    Follow Up Recommendations  Acute inpatient rehab (3hours/day)     Assistance Recommended at Discharge Frequent or constant Supervision/Assistance  Patient can return home with the following  Two people to help with walking and/or transfers;A lot of help with bathing/dressing/bathroom;Assistance with cooking/housework;Help with stairs or ramp for entrance;Assist for transportation   Equipment Recommendations  Other (comment) (Defer to next venue of care)       Precautions / Restrictions Precautions Precautions: Fall Precaution Comments: BP goal 120 - 140 within 24 hours of IR; Left hemi; Left homonymous hemianopia Restrictions Weight Bearing Restrictions: No       Mobility  Bed Mobility Overal bed mobility: Needs Assistance Bed Mobility: Supine to Sit     Supine to sit: HOB elevated, Min assist, +2 for physical assistance     General bed mobility comments: Min A to assist LLE off EOB and to bring trunk off HOB. Pt able to grasp therapist's hand with his RUE and pull trunk off bed. Decreased left side awareness and proprioception. Patient Response: Cooperative  Transfers Overall transfer level: Needs assistance Equipment used:  (stedy) Transfers: Sit to/from Stand, Bed to chair/wheelchair/BSC Sit to Stand: Mod assist, Max assist, +2 physical assistance, From elevated surface (flucuating assist needed. Initial sit to stand requiring more assistance.)           General transfer comment: Pt required max cueing and physical assist to manage LUE. Unable to maintain consistent grasp on bar even when right hand was placed on top. Increased difficulty with dual tasks as patient demonstrated decreased sitting balance when attempting to attend to left side prior to sit to stand. Transfer via Lift Equipment: Stedy   Balance Overall balance assessment: Needs assistance Sitting-balance support: Bilateral upper extremity supported, Feet supported Sitting balance-Leahy Scale: Poor Sitting balance - Comments: Static sitting EOB. Left lateral lean present. VC provided to self correct. Pt able to correct sitting posture with cues although unable to sustain for a long period of time. Postural control: Left lateral lean Standing balance support: Single extremity supported, During functional activity, Reliant on assistive device for balance Standing balance-Leahy Scale: Poor Standing balance comment: Reliant on Stedy to maintain left knee extension. VC provided to lean to right and shift weight. Provided visual while using IV pole. Unable to sustain and demonstrated difficulty with  maintaining midline while standing.       ADL either performed or assessed with clinical  judgement     Vision   Additional Comments: Required verbal and visual cues to attend to left and locate therapist and objects in room. Increased time required to locate   Perception Perception Perception: Impaired Inattention/Neglect: Does not attend to left visual field;Does not attend to left side of body Spatial Orientation: impaired   Praxis Praxis Praxis: Impaired Praxis Impairment Details: Motor planning;Initiation Praxis-Other Comments: Apraxic    Cognition Arousal/Alertness: Awake/alert Behavior During Therapy: Impulsive (slightly prior to standing)   Area of Impairment: Attention, Awareness, Problem solving       Memory: Decreased short-term memory, Decreased recall of precautions Following Commands: Follows one step commands with increased time, Follows one step commands consistently Safety/Judgement: Decreased awareness of safety, Decreased awareness of deficits   Problem Solving: Slow processing, Decreased initiation, Difficulty sequencing, Requires verbal cues, Requires tactile cues General Comments: Continues to have left visual and physical neglect requiring VC and visual cues to attend.              General Comments VSS. 95% SpO2 on RA.    Pertinent Vitals/ Pain       Pain Assessment Pain Assessment:  (reports his back is sore from being in the bed. No number provided)         Frequency  Min 2X/week        Progress Toward Goals  OT Goals(current goals can now be found in the care plan section)  Progress towards OT goals: Progressing toward goals     Plan Discharge plan remains appropriate;Frequency remains appropriate    Co-evaluation    PT/OT/SLP Co-Evaluation/Treatment: Yes Reason for Co-Treatment: Complexity of the patient's impairments (multi-system involvement);For patient/therapist safety;To address functional/ADL transfers   OT goals addressed during session: Proper use of Adaptive equipment and DME;Strengthening/ROM       AM-PAC OT "6 Clicks" Daily Activity     Outcome Measure   Help from another person eating meals?: A Lot Help from another person taking care of personal grooming?: A Little Help from another person toileting, which includes using toliet, bedpan, or urinal?: Total Help from another person bathing (including washing, rinsing, drying)?: Total Help from another person to put on and taking off regular upper body clothing?: A Lot Help from another person to put on and taking off regular lower body clothing?: Total 6 Click Score: 10    End of Session Equipment Utilized During Treatment: Gait belt;Other (comment) Charlaine Dalton)  OT Visit Diagnosis: Unsteadiness on feet (R26.81);Muscle weakness (generalized) (M62.81);Apraxia (R48.2);Hemiplegia and hemiparesis Hemiplegia - Right/Left: Left Hemiplegia - dominant/non-dominant: Non-Dominant Hemiplegia - caused by: Cerebral infarction   Activity Tolerance Patient tolerated treatment well   Patient Left in chair;with call bell/phone within reach;with chair alarm set   Nurse Communication Mobility status;Need for lift equipment        Time: [phone] OT Time Calculation (min): 32 min  Charges: OT General Charges $OT Visit: 1 Visit OT Treatments $Neuromuscular Re-education: 8-22 mins  Ailene Ravel, OTR/L,CBIS  Supplemental OT - [facility] and [facility] Secure Chat Preferred    Ariann Khaimov, Clarene Duke 05/12/2022, 11:15 AM

## 2022-05-13 DIAGNOSIS — I63411 Cerebral infarction due to embolism of right middle cerebral artery: Secondary | ICD-10-CM | POA: Diagnosis not present

## 2022-05-13 MED ORDER — TRAZODONE HCL 50 MG PO TABS
50.0000 mg | ORAL_TABLET | Freq: Every evening | ORAL | Status: DC | PRN
  Administered 2022-05-14: 50 mg via ORAL
  Filled 2022-05-13: qty 1

## 2022-05-13 NOTE — Progress Notes (Signed)
Report given to [facility] on [address]

## 2022-05-13 NOTE — Progress Notes (Signed)
Pt transferred to [address]. Wife at bedside. Dentures, phone, phone holder, round white electronic device with pt. Pt's daughter/son-in-law aware of pt's new location.

## 2022-05-13 NOTE — Progress Notes (Signed)
Speech Language Pathology Treatment: Dysphagia  Patient Details Name: Edward Maynard MRN: [id number] DOB: Nov 12, 1933 Today's Date: 05/13/2022 Time: [id number] SLP Time Calculation (min) (ACUTE ONLY): 23 min  Assessment / Plan / Recommendation Clinical Impression  Pt seen for ongoing dysphagia management and was re-evaluated today at RN request as pt has been asking for ice chips and water.  Today pt tolerated ice chips without any clinical s/s of aspiration and exhibited adequate oral clearance following prolonged oral phase 2/2 edentulism.  Dentures were placed for additional trials.  With thin liquid by straw there was intermittent delayed cough.  There was penetration of thin liquid residuals observed on MBS completed 2/22 with sensed aspiration observed but not fully cleared.  Pt tolerated puree but some anterior loss was observed on L side.  With trials of soft solid there was prolonged oral phase and moderate residue on hard palate which was cleared with liquid was and eventual suction.  Given pt's clinical presentation today, recommend continuing current modified diet.  Pt may have ice chips and small amounts of unthickened water only by spoon or straw in between meals, after good oral care, in moderation, when fully awake/alert, with upright positioning and supervision.  Recommend puree diet with nectar thick liquids.    HPI HPI: 89 year old male with atrial fibrillation (on Xarelto), prior stroke, presenting with acute onset of left hemiplegia and left hemineglect after sustaining a fall to the left while at home this evening. Underwent thrombectomy      SLP Plan  Continue with current plan of care      Recommendations for follow up therapy are one component of a multi-disciplinary discharge planning process, led by the attending physician.  Recommendations may be updated based on patient status, additional functional criteria and insurance authorization.    Recommendations  Diet  recommendations: Dysphagia 1 (puree);Nectar-thick liquid  Liquids provided via: Straw  Medication Administration: Crushed with puree  Supervision: Staff to assist with self feeding  Compensations: Slow rate;Small sips/bites  Postural Changes and/or Swallow Maneuvers: Seated upright 90 degrees                Oral Care Recommendations: Oral care BID Follow Up Recommendations: Acute inpatient rehab (3hours/day) SLP Visit Diagnosis: Dysphagia, oropharyngeal phase (R13.12) Plan: Continue with current plan of care           Celedonio Savage, [place], [place] Office: [phone] 05/13/2022, 12:04 PM

## 2022-05-13 NOTE — Progress Notes (Signed)
STROKE TEAM PROGRESS NOTE   SUBJECTIVE (INTERVAL HISTORY)  His daughter is on the speaker phone. RN at bedside. Doing well, sitting up in chair, eating soft mechanical diet. Per RN got a little confused this morning, thought he was in the [place]. Doing better now. Did get Ambien overnight.  No BM for 3 days.  Off drips.  VSS.   Has two IV's will d/c one. Transfer out of ICU.  OBJECTIVE Temp:  [97.7 F (36.5 C)-98.4 F (36.9 C)] 98.1 F (36.7 C) (02/24 0400) Pulse Rate:  [68-86] 72 (02/24 0600) Cardiac Rhythm: Normal sinus rhythm (02/23 2000) Resp:  [13-22] 19 (02/24 0600) BP: (128-175)/(68-103) 151/82 (02/24 0600) SpO2:  [89 %-96 %] 95 % (02/24 0600)  Recent Labs  Lab 05/10/22 0003  GLUCAP 135*    Recent Labs  Lab 05/10/22 0005 05/10/22 0010 05/10/22 0545 05/11/22 1246 05/12/22 0631  NA 137 139 135 135 137  K 3.5 3.7 3.8 3.9 3.8  CL 103 103 103 103 102  CO2 21*  --  '22 27 23  '$ GLUCOSE 158* 157* 183* 123* 122*  BUN 20 25* '19 19 19  '$ CREATININE 1.33* 1.30* 1.43* 1.13 1.03  CALCIUM 9.2  --  8.6* 8.5* 8.7*  PHOS  --   --   --  2.4*  --     Recent Labs  Lab 05/10/22 0005 05/11/22 1246  AST 27  --   ALT 20  --   ALKPHOS 93  --   BILITOT 0.4  --   PROT 7.0  --   ALBUMIN 4.1 3.5    Recent Labs  Lab 05/10/22 0005 05/10/22 0010 05/10/22 0545 05/11/22 1246 05/12/22 0631  WBC 9.2  --  11.6* 8.7 7.4  NEUTROABS 6.6  --  9.2*  --   --   HGB 16.7 16.7 16.0 13.7 14.5  HCT 49.6 49.0 46.7 39.4 43.0  MCV 98.4  --  97.3 97.3 98.2  PLT 141*  --  164 144* 146*    No results for input(s): "CKTOTAL", "CKMB", "CKMBINDEX", "TROPONINI" in the last 168 hours. No results for input(s): "LABPROT", "INR" in the last 72 hours.  No results for input(s): "COLORURINE", "LABSPEC", "PHURINE", "GLUCOSEU", "HGBUR", "BILIRUBINUR", "KETONESUR", "PROTEINUR", "UROBILINOGEN", "NITRITE", "LEUKOCYTESUR" in the last 72 hours.  Invalid input(s): "APPERANCEUR"     Component Value  Date/Time   CHOL 134 05/10/2022 0545   TRIG 190 (H) 05/10/2022 0545   HDL 40 (L) 05/10/2022 0545   CHOLHDL 3.4 05/10/2022 0545   VLDL 38 05/10/2022 0545   LDLCALC 56 05/10/2022 0545   Lab Results  Component Value Date   HGBA1C 5.8 (H) 05/10/2022   No results found for: "LABOPIA", "COCAINSCRNUR", "LABBENZ", "AMPHETMU", "THCU", "LABBARB"  Recent Labs  Lab 05/10/22 0005  ETH <10     I have personally reviewed the radiological images below and agree with the radiology interpretations.  DG Swallowing Func-Speech Pathology  Result Date: 05/11/2022 Table formatting from the original result was not included. Modified Barium Swallow Study Patient Details Name: TRUC HETLAND MRN: [id number] Date of Birth: 1934/02/02 Today's Date: 05/11/2022 HPI/PMH: HPI: 89 year old male with atrial fibrillation (on Xarelto), prior stroke, presenting with acute onset of left hemiplegia and left hemineglect after sustaining a fall to the left while at home this evening. Underwent thrombectomy Clinical Impression: Clinical Impression: Pt demonstrates moderate to severe oral dysphagia witn left CN VII weakness and sensory impairment leading to severe anterior spillage with cup sips of liquids. Lingual, buccal  and palatal residue present with most trials. Thin residue spills to pharynx post swallow. There is delayed swallow initaition with all liquids. No aspiration of thin occurred until pt attempted consecutive straw sips (PAS 7); however, there is higher risk with thin and severe anterior spillage. Nectar was better controlled orally and with a straw, which reduced oral residue and spillage. Pt unable to masticate solids without dentures and lingual manipulation of bolus very poor. Recommend purees and nectar thick liquids while pt learns strategies and improves awareness. Can quickly upgrade to thin as pt demonstrates awareness and control without severe coughing Factors that may increase risk of adverse event in  presence of aspiration ([place] 2021): Factors that may increase risk of adverse event in presence of aspiration ([place] 2021): Limited mobility; Reduced cognitive function Recommendations/Plan: Swallowing Evaluation Recommendations Swallowing Evaluation Recommendations Liquid Administration via: Straw; Cup Medication Administration: Whole meds with puree Supervision: Full supervision/cueing for swallowing strategies Swallowing strategies  : Slow rate; Small bites/sips; Minimize environmental distractions; Check for anterior loss Postural changes: Stay upright 30-60 min after meals Treatment Plan Treatment Plan Treatment recommendations: Therapy as outlined in treatment plan below Follow-up recommendations: Acute inpatient rehab (3 hours/day) Functional status assessment: Patient has had a recent decline in their functional status and demonstrates the ability to make significant improvements in function in a reasonable and predictable amount of time. Treatment frequency: Min 2x/week Treatment duration: 2 weeks Interventions: Aspiration precaution training; Diet toleration management by SLP; Trials of upgraded texture/liquids; Patient/family education; Compensatory techniques Recommendations Recommendations for follow up therapy are one component of a multi-disciplinary discharge planning process, led by the attending physician.  Recommendations may be updated based on patient status, additional functional criteria and insurance authorization. Assessment: Orofacial Exam: Orofacial Exam Oral Cavity - Dentition: Dentures, top; Dentures, bottom Anatomy: Anatomy: WFL Thin Liquids: Thin Liquids (Level 0) Thin Liquids : Impaired Bolus delivery method: Cup Thin Liquid - Impairment: Oral Impairment; Pharyngeal impairment Lip Closure: Escape beyond mid-chin Tongue control during bolus hold: Escape to lateral buccal cavity/floor of mouth Bolus transport/lingual motion: Repetitive/disorganized tongue motion  Oral residue: Residue collection on oral structures Location of oral residue : Floor of mouth; Lateral sulci; Tongue Initiation of swallow : Pyriform sinuses Soft palate elevation: No bolus between soft palate (SP)/pharyngeal wall (PW) Laryngeal elevation: Complete superior movement of thyroid cartilage with complete approximation of arytenoids to epiglottic petiole Anterior hyoid excursion: Complete Epiglottic movement: Complete Laryngeal vestibule closure: Complete, no air/contrast in laryngeal vestibule Pharyngeal stripping wave : Present - complete Pharyngeal contraction (A/P view only): N/A Pharyngoesophageal segment opening: Complete distension and complete duration, no obstruction of flow Tongue base retraction: Trace column of contrast or air between tongue base and PPW Pharyngeal residue: Trace residue within or on pharyngeal structures Location of pharyngeal residue: Valleculae Penetration/Aspiration Scale (PAS) score: 7.  Material enters airway, passes BELOW cords and not ejected out despite cough attempt by patient; 1.  Material does not enter airway (aspiration with straw only)  Mildly Thick Liquids: Mildly thick liquids (Level 2, nectar thick) Mildly thick liquids (Level 2, nectar thick): Impaired Bolus delivery method: Cup Mildly Thick Liquid - Impairment: Oral Impairment; Pharyngeal impairment Lip Closure: Escape beyond mid-chin Tongue control during bolus hold: Escape to lateral buccal cavity/floor of mouth Bolus transport/lingual motion: Repetitive/disorganized tongue motion Oral residue: Residue collection on oral structures Location of oral residue : Lateral sulci; Floor of mouth Initiation of swallow : Pyriform sinuses Soft palate elevation: No bolus between soft palate (SP)/pharyngeal wall (  PW) Laryngeal elevation: Complete superior movement of thyroid cartilage with complete approximation of arytenoids to epiglottic petiole Anterior hyoid excursion: Complete Epiglottic movement: Complete  Laryngeal vestibule closure: Complete, no air/contrast in laryngeal vestibule Pharyngeal stripping wave : Present - complete Pharyngeal contraction (A/P view only): Complete; N/A Pharyngoesophageal segment opening: Complete distension and complete duration, no obstruction of flow Tongue base retraction: Trace column of contrast or air between tongue base and PPW Pharyngeal residue: Trace residue within or on pharyngeal structures Location of pharyngeal residue: Valleculae  Moderately Thick Liquids: Moderately thick liquids (Level 3, honey thick) Moderately thick liquids (Level 3, honey thick): Impaired Bolus delivery method: Spoon Moderately Thick Liquid - Impairment: Oral Impairment Lip Closure: Escape from interlabial space or lateral juncture, no extension beyond vermillion border Tongue control during bolus hold: Escape to lateral buccal cavity/floor of mouth Bolus transport/lingual motion: Repetitive/disorganized tongue motion Oral residue: Residue collection on oral structures Location of oral residue : Floor of mouth; Lateral sulci Initiation of swallow : Posterior laryngeal surface of the epiglottis  Puree: Puree Puree: Impaired Puree - Impairment: Oral Impairment Lip Closure: Interlabial escape, no progression to anterior lip Bolus transport/lingual motion: Repetitive/disorganized tongue motion Oral residue: Trace residue lining oral structures Location of oral residue : Tongue Initiation of swallow: Valleculae Solid: Solid Solid: Impaired Solid - Impairment: Oral Impairment Lip Closure: Escape beyond mid-chin Bolus preparation/mastication: Minimal chewing/mashing with majority of bolus unchewed Bolus transport/lingual motion: Repetitive/disorganized tongue motion Oral residue: Majority of bolus remaining Location of oral residue : Lateral sulci Pill: Pill Pill: Not Tested Compensatory Strategies: Compensatory Strategies Compensatory strategies: Yes Straw: Effective Effective Straw: Mildly thick liquid  (Level 2, nectar thick) (aspiration with thin)   General Information: Caregiver present: No  Diet Prior to this Study: NPO   No data recorded  No data recorded  Supplemental O2: None (Room air)   History of Recent Intubation: No  Behavior/Cognition: Alert; Cooperative; Pleasant mood No data recorded Baseline vocal quality/speech: Normal Volitional Cough: Able to elicit Volitional Swallow: Able to elicit No data recorded Goal Planning: Prognosis for improved oropharyngeal function: Good Barriers to Reach Goals: Cognitive deficits No data recorded No data recorded Consulted and agree with results and recommendations: Patient; Family member/caregiver Pain: Pain Assessment Pain Assessment: Faces Faces Pain Scale: 0 Pain Intervention(s): Monitored during session End of Session: Start Time:SLP Start Time (ACUTE ONLY): 1345 Stop Time: SLP Stop Time (ACUTE ONLY): 1405 Time Calculation:SLP Time Calculation (min) (ACUTE ONLY): 20 min Charges: SLP Evaluations $ SLP Speech Visit: 1 Visit SLP Evaluations $BSS Swallow: 1 Procedure $MBS Swallow: 1 Procedure $ SLP EVAL LANGUAGE/SOUND PRODUCTION: 1 Procedure $Swallowing Treatment: 1 Procedure $Speech Treatment for Individual: 1 Procedure SLP visit diagnosis: SLP Visit Diagnosis: Dysphagia, oropharyngeal phase (R13.12) Past Medical History: Past Medical History: Diagnosis Date  High cholesterol   05/2013; no defecits  Hyperlipemia   Left arm weakness   Left hand weakness   Personal history of colonic polyp - adenoma  10/23/2013  Stroke ([place]) 05/2013  MRI on 07/02/13 = Multifocal acute & subacute infarction involving right frontal MCA/ACA & left parietal MCA/PCA watershed areas Past Surgical History: Past Surgical History: Procedure Laterality Date  IR CT HEAD LTD  05/10/2022  IR PERCUTANEOUS ART THROMBECTOMY/INFUSION INTRACRANIAL INC DIAG ANGIO  05/10/2022  LOOP RECORDER IMPLANT N/A 10/31/2013  Procedure: LOOP RECORDER IMPLANT;  Surgeon: Coralyn Mark, MD;  Location: [facility];  Service:  Cardiovascular;  Laterality: N/A;  RADIOLOGY WITH ANESTHESIA N/A 05/10/2022  Procedure: IR WITH ANESTHESIA;  Surgeon:  Luanne Bras, MD;  Location: [facility];  Service: Radiology;  Laterality: N/A;  SHOULDER SURGERY Bilateral 2003, 1995 [place], Katherene Ponto 05/11/2022, 2:20 PM  IR PERCUTANEOUS ART THROMBECTOMY/INFUSION INTRACRANIAL INC DIAG ANGIO  Result Date: 05/11/2022 INDICATION: Worsening left-sided weakness and right gaze deviation. Occluded distal M2 segment of the inferior division of the right middle cerebral artery on CT angiogram of the head and neck. EXAM: 1. EMERGENT LARGE VESSEL OCCLUSION THROMBOLYSIS anterior CIRCULATION) COMPARISON:  CT angiogram of the head and neck of May 10, 2022. MEDICATIONS: Ancef 2 g IV antibiotic was administered within 1 hour of the procedure. ANESTHESIA/SEDATION: General anesthesia. CONTRAST:  Omnipaque 300 approximately 70 mL. FLUOROSCOPY TIME:  Fluoroscopy Time: 41 minutes 16 seconds (2313 mGy). COMPLICATIONS: None immediate. TECHNIQUE: Following a full explanation of the procedure along with the potential associated complications, an informed witnessed consent was obtained. The risks of intracranial hemorrhage of 10%, worsening neurological deficit, ventilator dependency, death and inability to revascularize were all reviewed in detail with the patient's spouse. The patient was then put under general anesthesia by the Department of Anesthesiology at [facility]. The right groin was prepped and draped in the usual sterile fashion. Thereafter using modified Seldinger technique, transfemoral access into the right common femoral artery was obtained without difficulty. Over a 0.035 inch guidewire an 8 French 25 cm Pinnacle sheath was inserted. Through this a combination of a 125 cm 5 [place] Berenstein support catheter inside of an 087 95 cm balloon guide catheter was advanced initially proximal to the right common carotid bifurcation and then into the distal  right internal carotid artery. FINDINGS: The right common carotid bifurcation demonstrates the right external carotid artery and its major branches to be widely patent. The right internal carotid artery at the bulb demonstrates a smooth shallow plaque along the posterior wall associated with 10% stenosis by the NASCET criteria. Distal to this the right internal carotid artery demonstrates opacification to the cranial skull base. The petrous, the cavernous and the supraclinoid right ICA demonstrate wide patency. A right posterior communicating artery is seen opacifying the right posterior cerebral artery distribution. The right anterior cerebral artery opacifies into the capillary and venous phases. The right middle cerebral artery M1 segment is widely patent. There is an occlusion of a parietal branch of the inferior division of the right middle cerebral artery in the distal M3 region. Delayed arterial and capillary phase demonstrates significant retrograde opacification of the distal right parietal cortical and subcortical distribution from the anterior cerebral and the P3 leptomeningeal branches of the right posterior cerebral artery. PROCEDURE: Through the balloon guide catheter in the distal right internal carotid artery, a combination of an 045 Zoom aspiration catheter with a 160 cm Trevo microcatheter was advanced over an 014 inch soft tip micro guidewire with a moderate J configuration to the supraclinoid right ICA. The micro guidewire was then gently manipulated with a torque device and advanced without difficulty distal to the occluded branch of the inferior division into the M3 region followed by the microcatheter. A 3 mm x 20 mm Solitaire X retrieval device was then deployed following verification of safe positioning of the tip of the microcatheter. This was then followed by the advancement of the Zoom aspiration catheter which was engaged just inside the occluded portion of the vessel, the micro  guidewire and the microcatheter were retrieved proximally. Following 2 minutes of contact aspiration with a Penumbra aspiration device and proximal flow arrest, the Zoom aspiration catheter was removed. Two approximately 1  mm pearly sticky clots were retrieved. A control arteriogram demonstrated no significant change in the occluded parietal branch. A second pass was then made using the above combination. The microcatheter was advanced past the occlusion into the M3 region with the micro guidewire. The guidewire was removed. Good aspiration obtained from the hub of the microcatheter which was then connected to continuous heparinized saline infusion. The Zoom aspiration catheter was then engaged just inside the occluded segment of the vessel. A 3 mm x 20 mm Solitaire X retrieval device was then deployed in the usual manner. Thereafter, constant aspiration was applied with proximal flow arrest the hub of the Zoom aspiration catheter for a minute and a half. Following this the combination of the retrieval device, the microcatheter and the aspiration catheter were removed. A control arteriogram performed through the balloon guide catheter following reversal of flow arrest now demonstrated improved flow through the occluded branch though slow to the M3 M4 regions. A third pass was then made using a combination using an 035 inch aspiration catheter advanced over an 014 inch soft tip micro guidewire with a moderate J configuration to the right middle cerebral artery. Access into the now reoccluded inferior division M2 branch was then made with the micro guidewire followed by the microcatheter into the distal M3 region. The guidewire was removed. The 035 inch Zoom aspiration was then engaged into the clot with no aspiration obtained. Aspiration was then applied with a Penumbra aspiration device for approximately 2 minutes with proximal flow arrest. The aspiration catheter was removed. A control arteriogram performed through  the balloon guide catheter demonstrated mild improvement in revascularization of the occluded branch with the clot noted slightly more distally. A fourth pass was then made this time again using an 045 Zoom aspiration catheter advanced with an 021 microcatheter and over 014 inch micro guidewire. This combination was then advanced into the M3 region of the occluded branch followed by the microcatheter. The guidewire was removed. Good aspiration obtained from the hub of the microcatheter which was positioned distally in the M3 segment. This was then connected to continuous heparinized saline infusion. A 4 mm x 40 mm Solitaire X retrieval stent was then deployed in the usual manner with the 045 Zoom aspiration catheter imbedded in the occluded branch. Aspiration was then for approximately 2 minutes at the hub of the aspiration catheter with proximal flow arrest. Thereafter, the retriever, the microcatheter and the Zoom aspiration catheter were removed. A control arteriogram performed following reversal of flow arrest in the right internal carotid artery, continued to demonstrate slow antegrade flow distal to the occluded segment. A control arteriogram performed through the balloon guide catheter continued to demonstrate a TICI 2C over revascularization with only mild distal flow past the occluded M2 branch of the right inferior division MCA. No further passes were made due to now steadily increased risk of vessel injury perforation. The balloon guide was retrieved in the right common carotid artery. A control arteriogram performed through this continued to demonstrate patency of right internal carotid artery proximally and distally including that of the right posterior communicating artery, the right anterior cerebral artery and the right middle cerebral artery distributions. An 8 French Angio-Seal closure device was then deployed in the right groin puncture site for hemostasis. Distal pulses remained Dopplerable in  both feet unchanged from prior to the procedure. A flat panel CT of the brain demonstrated no evidence of intra cerebral hemorrhage. The patient's general anesthesia was reversed, and patient was extubated.  Initially agitated, the patient gradually settled down to where simple commands appropriately and verbalized appropriately. The patient moved his left lower extremities spontaneously and to command with good strength. Slight movement was evident in the left upper extremity. The patient was then transferred to the neuro ICU for post revascularization management. IMPRESSION: Status post endovascular revascularization of occluded distal M2 branch of the right middle cerebral artery inferior division with 2 passes with a 3 mm x 20 mm retrieval device and contact aspiration, 1 pass with a 4 mm x 40 mm Solitaire X retrieval device and contact aspiration, 1 pass with contact aspiration with minimal improvement in revascularization of the occluded right MCA inferior division M2 segment occlusion. A TICI 2C revascularization was maintained. PLAN: Follow-up as per referring MD. Electronically Signed   By: Luanne Bras M.D.   On: 05/11/2022 08:28   IR CT Head Ltd  Result Date: 05/11/2022 INDICATION: Worsening left-sided weakness and right gaze deviation. Occluded distal M2 segment of the inferior division of the right middle cerebral artery on CT angiogram of the head and neck. EXAM: 1. EMERGENT LARGE VESSEL OCCLUSION THROMBOLYSIS anterior CIRCULATION) COMPARISON:  CT angiogram of the head and neck of May 10, 2022. MEDICATIONS: Ancef 2 g IV antibiotic was administered within 1 hour of the procedure. ANESTHESIA/SEDATION: General anesthesia. CONTRAST:  Omnipaque 300 approximately 70 mL. FLUOROSCOPY TIME:  Fluoroscopy Time: 41 minutes 16 seconds (2313 mGy). COMPLICATIONS: None immediate. TECHNIQUE: Following a full explanation of the procedure along with the potential associated complications, an informed  witnessed consent was obtained. The risks of intracranial hemorrhage of 10%, worsening neurological deficit, ventilator dependency, death and inability to revascularize were all reviewed in detail with the patient's spouse. The patient was then put under general anesthesia by the Department of Anesthesiology at [facility]. The right groin was prepped and draped in the usual sterile fashion. Thereafter using modified Seldinger technique, transfemoral access into the right common femoral artery was obtained without difficulty. Over a 0.035 inch guidewire an 8 French 25 cm Pinnacle sheath was inserted. Through this a combination of a 125 cm 5 [place] Berenstein support catheter inside of an 087 95 cm balloon guide catheter was advanced initially proximal to the right common carotid bifurcation and then into the distal right internal carotid artery. FINDINGS: The right common carotid bifurcation demonstrates the right external carotid artery and its major branches to be widely patent. The right internal carotid artery at the bulb demonstrates a smooth shallow plaque along the posterior wall associated with 10% stenosis by the NASCET criteria. Distal to this the right internal carotid artery demonstrates opacification to the cranial skull base. The petrous, the cavernous and the supraclinoid right ICA demonstrate wide patency. A right posterior communicating artery is seen opacifying the right posterior cerebral artery distribution. The right anterior cerebral artery opacifies into the capillary and venous phases. The right middle cerebral artery M1 segment is widely patent. There is an occlusion of a parietal branch of the inferior division of the right middle cerebral artery in the distal M3 region. Delayed arterial and capillary phase demonstrates significant retrograde opacification of the distal right parietal cortical and subcortical distribution from the anterior cerebral and the P3 leptomeningeal branches  of the right posterior cerebral artery. PROCEDURE: Through the balloon guide catheter in the distal right internal carotid artery, a combination of an 045 Zoom aspiration catheter with a 160 cm Trevo microcatheter was advanced over an 014 inch soft tip micro guidewire with a moderate J  configuration to the supraclinoid right ICA. The micro guidewire was then gently manipulated with a torque device and advanced without difficulty distal to the occluded branch of the inferior division into the M3 region followed by the microcatheter. A 3 mm x 20 mm Solitaire X retrieval device was then deployed following verification of safe positioning of the tip of the microcatheter. This was then followed by the advancement of the Zoom aspiration catheter which was engaged just inside the occluded portion of the vessel, the micro guidewire and the microcatheter were retrieved proximally. Following 2 minutes of contact aspiration with a Penumbra aspiration device and proximal flow arrest, the Zoom aspiration catheter was removed. Two approximately 1 mm pearly sticky clots were retrieved. A control arteriogram demonstrated no significant change in the occluded parietal branch. A second pass was then made using the above combination. The microcatheter was advanced past the occlusion into the M3 region with the micro guidewire. The guidewire was removed. Good aspiration obtained from the hub of the microcatheter which was then connected to continuous heparinized saline infusion. The Zoom aspiration catheter was then engaged just inside the occluded segment of the vessel. A 3 mm x 20 mm Solitaire X retrieval device was then deployed in the usual manner. Thereafter, constant aspiration was applied with proximal flow arrest the hub of the Zoom aspiration catheter for a minute and a half. Following this the combination of the retrieval device, the microcatheter and the aspiration catheter were removed. A control arteriogram performed  through the balloon guide catheter following reversal of flow arrest now demonstrated improved flow through the occluded branch though slow to the M3 M4 regions. A third pass was then made using a combination using an 035 inch aspiration catheter advanced over an 014 inch soft tip micro guidewire with a moderate J configuration to the right middle cerebral artery. Access into the now reoccluded inferior division M2 branch was then made with the micro guidewire followed by the microcatheter into the distal M3 region. The guidewire was removed. The 035 inch Zoom aspiration was then engaged into the clot with no aspiration obtained. Aspiration was then applied with a Penumbra aspiration device for approximately 2 minutes with proximal flow arrest. The aspiration catheter was removed. A control arteriogram performed through the balloon guide catheter demonstrated mild improvement in revascularization of the occluded branch with the clot noted slightly more distally. A fourth pass was then made this time again using an 045 Zoom aspiration catheter advanced with an 021 microcatheter and over 014 inch micro guidewire. This combination was then advanced into the M3 region of the occluded branch followed by the microcatheter. The guidewire was removed. Good aspiration obtained from the hub of the microcatheter which was positioned distally in the M3 segment. This was then connected to continuous heparinized saline infusion. A 4 mm x 40 mm Solitaire X retrieval stent was then deployed in the usual manner with the 045 Zoom aspiration catheter imbedded in the occluded branch. Aspiration was then for approximately 2 minutes at the hub of the aspiration catheter with proximal flow arrest. Thereafter, the retriever, the microcatheter and the Zoom aspiration catheter were removed. A control arteriogram performed following reversal of flow arrest in the right internal carotid artery, continued to demonstrate slow antegrade flow  distal to the occluded segment. A control arteriogram performed through the balloon guide catheter continued to demonstrate a TICI 2C over revascularization with only mild distal flow past the occluded M2 branch of the right inferior division MCA.  No further passes were made due to now steadily increased risk of vessel injury perforation. The balloon guide was retrieved in the right common carotid artery. A control arteriogram performed through this continued to demonstrate patency of right internal carotid artery proximally and distally including that of the right posterior communicating artery, the right anterior cerebral artery and the right middle cerebral artery distributions. An 8 French Angio-Seal closure device was then deployed in the right groin puncture site for hemostasis. Distal pulses remained Dopplerable in both feet unchanged from prior to the procedure. A flat panel CT of the brain demonstrated no evidence of intra cerebral hemorrhage. The patient's general anesthesia was reversed, and patient was extubated. Initially agitated, the patient gradually settled down to where simple commands appropriately and verbalized appropriately. The patient moved his left lower extremities spontaneously and to command with good strength. Slight movement was evident in the left upper extremity. The patient was then transferred to the neuro ICU for post revascularization management. IMPRESSION: Status post endovascular revascularization of occluded distal M2 branch of the right middle cerebral artery inferior division with 2 passes with a 3 mm x 20 mm retrieval device and contact aspiration, 1 pass with a 4 mm x 40 mm Solitaire X retrieval device and contact aspiration, 1 pass with contact aspiration with minimal improvement in revascularization of the occluded right MCA inferior division M2 segment occlusion. A TICI 2C revascularization was maintained. PLAN: Follow-up as per referring MD. Electronically Signed   By:  Luanne Bras M.D.   On: 05/11/2022 08:28   MR BRAIN WO CONTRAST  Result Date: 05/10/2022 CLINICAL DATA:  Stroke, follow up EXAM: MRI HEAD WITHOUT CONTRAST MRA HEAD WITHOUT CONTRAST TECHNIQUE: Multiplanar, multi-echo pulse sequences of the brain and surrounding structures were acquired without intravenous contrast. Angiographic images of the Circle of Willis were acquired using MRA technique without intravenous contrast. COMPARISON:  CTA and CT head from same day. FINDINGS: MRI HEAD FINDINGS Motion limited study. Brain: Acute posterior right MCA territory infarct. Associated edema and regional mass effect without significant midline shift. No mass occupying acute hemorrhage. No mass lesion. No hydrocephalus. Remote infarct in the right cerebellum. Vascular: Detailed below. Skull and upper cervical spine: Normal marrow signal. Sinuses/Orbits: Clear sinuses.  No acute orbital findings. MRA HEAD FINDINGS Severely motion limited study. Anterior circulation: Bilateral intracranial ICAs, proximal ACAs and proximal M1 MCAs appear grossly patent. Essentially nondiagnostic evaluation of the M2 MCAs, including the region of occlusion seen on recent CTA. Posterior circulation: The intradural vertebral arteries, basilar arteries and proximal PCAs are grossly patent. IMPRESSION: 1. Acute right posterior MCA territory infarct. Associated edema without midline shift. 2. Severely motion limited MRA with nondiagnostic evaluation of the right M2 MCA in the region of recently seen occlusion on CTA. A repeat exclude exam may be able to better assess if the patient is able in clinically warranted. Electronically Signed   By: Margaretha Sheffield M.D.   On: 05/10/2022 16:03   MR ANGIO HEAD WO CONTRAST  Result Date: 05/10/2022 CLINICAL DATA:  Stroke, follow up EXAM: MRI HEAD WITHOUT CONTRAST MRA HEAD WITHOUT CONTRAST TECHNIQUE: Multiplanar, multi-echo pulse sequences of the brain and surrounding structures were acquired without  intravenous contrast. Angiographic images of the Circle of Willis were acquired using MRA technique without intravenous contrast. COMPARISON:  CTA and CT head from same day. FINDINGS: MRI HEAD FINDINGS Motion limited study. Brain: Acute posterior right MCA territory infarct. Associated edema and regional mass effect without significant midline shift. No mass  occupying acute hemorrhage. No mass lesion. No hydrocephalus. Remote infarct in the right cerebellum. Vascular: Detailed below. Skull and upper cervical spine: Normal marrow signal. Sinuses/Orbits: Clear sinuses.  No acute orbital findings. MRA HEAD FINDINGS Severely motion limited study. Anterior circulation: Bilateral intracranial ICAs, proximal ACAs and proximal M1 MCAs appear grossly patent. Essentially nondiagnostic evaluation of the M2 MCAs, including the region of occlusion seen on recent CTA. Posterior circulation: The intradural vertebral arteries, basilar arteries and proximal PCAs are grossly patent. IMPRESSION: 1. Acute right posterior MCA territory infarct. Associated edema without midline shift. 2. Severely motion limited MRA with nondiagnostic evaluation of the right M2 MCA in the region of recently seen occlusion on CTA. A repeat exclude exam may be able to better assess if the patient is able in clinically warranted. Electronically Signed   By: Margaretha Sheffield M.D.   On: 05/10/2022 16:03   ECHOCARDIOGRAM COMPLETE  Result Date: 05/10/2022    ECHOCARDIOGRAM REPORT   Patient Name:   BRYCESON BRODEUR Date of Exam: 05/10/2022 Medical Rec #:  [id number]     Height:       71.5 in Accession #:    [id number]    Weight:       223.1 lb Date of Birth:  01/01/1934    BSA:          2.220 m Patient Age:    83 years      BP:           160/90 mmHg Patient Gender: M             HR:           73 bpm. Exam Location:  Inpatient Procedure: 2D Echo, Cardiac Doppler and Color Doppler Indications:    Stroke I63.9  History:        Patient has prior history of  Echocardiogram examinations and                 Patient has no prior history of Echocardiogram examinations.                 Pacemaker, Stroke, Arrythmias:PVC and Tachycardia; Risk                 Factors:Dyslipidemia.  Sonographer:    Ronny Flurry Referring Phys: [id number] ERIC LINDZEN  Sonographer Comments: Image acquisition challenging due to respiratory motion. IMPRESSIONS  1. Left ventricular ejection fraction, by estimation, is 60 to 65%. The left ventricle has normal function. The left ventricle has no regional wall motion abnormalities. Left ventricular diastolic parameters are consistent with Grade II diastolic dysfunction (pseudonormalization).  2. Right ventricular systolic function is normal. The right ventricular size is normal. There is normal pulmonary artery systolic pressure.  3. The mitral valve is normal in structure. No evidence of mitral valve regurgitation. No evidence of mitral stenosis.  4. The aortic valve is calcified. Aortic valve regurgitation is trivial. Mild aortic valve stenosis. Aortic valve area, by VTI measures 1.55 cm. Aortic valve mean gradient measures 19.0 mmHg. Aortic valve Vmax measures 2.94 m/s.  5. The inferior vena cava is normal in size with greater than 50% respiratory variability, suggesting right atrial pressure of 3 mmHg. FINDINGS  Left Ventricle: Left ventricular ejection fraction, by estimation, is 60 to 65%. The left ventricle has normal function. The left ventricle has no regional wall motion abnormalities. The left ventricular internal cavity size was normal in size. There is  no left ventricular hypertrophy. Left ventricular diastolic parameters are consistent  with Grade II diastolic dysfunction (pseudonormalization). Indeterminate filling pressures. Right Ventricle: The right ventricular size is normal. No increase in right ventricular wall thickness. Right ventricular systolic function is normal. There is normal pulmonary artery systolic pressure. The tricuspid  regurgitant velocity is 1.45 m/s, and  with an assumed right atrial pressure of 3 mmHg, the estimated right ventricular systolic pressure is [id number] mmHg. Left Atrium: Left atrial size was normal in size. Right Atrium: Right atrial size was normal in size. Pericardium: There is no evidence of pericardial effusion. Mitral Valve: The mitral valve is normal in structure. No evidence of mitral valve regurgitation. No evidence of mitral valve stenosis. Tricuspid Valve: The tricuspid valve is normal in structure. Tricuspid valve regurgitation is trivial. No evidence of tricuspid stenosis. Aortic Valve: The aortic valve is calcified. Aortic valve regurgitation is trivial. Mild aortic stenosis is present. Aortic valve mean gradient measures 19.0 mmHg. Aortic valve peak gradient measures 34.5 mmHg. Aortic valve area, by VTI measures 1.55 cm. Pulmonic Valve: The pulmonic valve was normal in structure. Pulmonic valve regurgitation is not visualized. No evidence of pulmonic stenosis. Aorta: The aortic root is normal in size and structure. Venous: The inferior vena cava is normal in size with greater than 50% respiratory variability, suggesting right atrial pressure of 3 mmHg. IAS/Shunts: No atrial level shunt detected by color flow Doppler.  LEFT VENTRICLE PLAX 2D LVIDd:         4.60 cm   Diastology LVIDs:         3.70 cm   LV e' medial:    7.80 cm/s LV PW:         1.30 cm   LV E/e' medial:  10.4 LV IVS:        1.20 cm   LV e' lateral:   11.60 cm/s LVOT diam:     2.20 cm   LV E/e' lateral: 7.0 LV SV:         89 LV SV Index:   40 LVOT Area:     3.80 cm  RIGHT VENTRICLE             IVC RV S prime:     16.60 cm/s  IVC diam: 2.10 cm TAPSE (M-mode): 1.5 cm LEFT ATRIUM             Index        RIGHT ATRIUM           Index LA diam:        4.60 cm 2.07 cm/m   RA Area:     15.10 cm LA Vol (A2C):   64.7 ml 29.14 ml/m  RA Volume:   31.80 ml  14.32 ml/m LA Vol (A4C):   58.7 ml 26.44 ml/m LA Biplane Vol: 62.9 ml 28.33 ml/m  AORTIC  VALVE AV Area (Vmax):    1.61 cm AV Area (Vmean):   1.68 cm AV Area (VTI):     1.55 cm AV Vmax:           293.50 cm/s AV Vmean:          189.800 cm/s AV VTI:            0.570 m AV Peak Grad:      34.5 mmHg AV Mean Grad:      19.0 mmHg LVOT Vmax:         124.00 cm/s LVOT Vmean:        83.833 cm/s LVOT VTI:  0.233 m LVOT/AV VTI ratio: 0.41  AORTA Ao Root diam: 3.30 cm MITRAL VALVE               TRICUSPID VALVE MV Area (PHT): 4.68 cm    TR Peak grad:   8.4 mmHg MV Decel Time: 162 msec    TR Vmax:        145.00 cm/s MV E velocity: 81.00 cm/s MV A velocity: 65.50 cm/s  SHUNTS MV E/A ratio:  1.24        Systemic VTI:  0.23 m                            Systemic Diam: 2.20 cm Skeet Latch MD Electronically signed by Skeet Latch MD Signature Date/Time: 05/10/2022/10:48:39 AM    Final    CT HEAD CODE STROKE WO CONTRAST  Result Date: 05/10/2022 CLINICAL DATA:  Acute neurologic deficit EXAM: CT HEAD WITHOUT CONTRAST CT CERVICAL SPINE WITHOUT CONTRAST TECHNIQUE: Multidetector CT imaging of the head and cervical spine was performed following the standard protocol without intravenous contrast. Multiplanar CT image reconstructions of the cervical spine were also generated. RADIATION DOSE REDUCTION: This exam was performed according to the departmental dose-optimization program which includes automated exposure control, adjustment of the mA and/or kV according to patient size and/or use of iterative reconstruction technique. COMPARISON:  None Available. FINDINGS: CT HEAD FINDINGS Brain: There is no mass, hemorrhage or extra-axial collection. Generalized atrophy. Multiple old small vessel infarcts of the cerebellum. There is hypoattenuation of the periventricular white matter, most commonly indicating chronic ischemic microangiopathy. Old right frontal and parietal infarcts. Vascular: No abnormal hyperdensity of the major intracranial arteries or dural venous sinuses. No intracranial atherosclerosis. Skull:  The visualized skull base, calvarium and extracranial soft tissues are normal. Sinuses/Orbits: No fluid levels or advanced mucosal thickening of the visualized paranasal sinuses. No mastoid or middle ear effusion. The orbits are normal. ASPECTS ([place] Stroke Program Early CT Score) - Ganglionic level infarction (caudate, lentiform nuclei, internal capsule, insula, M1-M3 cortex): 7 - Supraganglionic infarction (M4-M6 cortex): 3 Total score (0-10 with 10 being normal): 10 CT CERVICAL SPINE FINDINGS Alignment: No static subluxation. Facets are aligned. Occipital condyles and the lateral masses of C1 and C2 are normally approximated. Skull base and vertebrae: No acute fracture. Soft tissues and spinal canal: No prevertebral fluid or swelling. No visible canal hematoma. Disc levels: No advanced spinal canal or neural foraminal stenosis. Upper chest: No pneumothorax, pulmonary nodule or pleural effusion. Other: Normal visualized paraspinal cervical soft tissues. IMPRESSION: 1. No acute intracranial abnormality. 2. ASPECTS is 10. 3. No acute fracture or static subluxation of the cervical spine. These results were communicated to Dr. Kerney Elbe at 12:24 am on 05/10/2022 by text page via the [facility] messaging system. Electronically Signed   By: Ulyses Jarred M.D.   On: 05/10/2022 00:47   CT C-SPINE NO CHARGE  Result Date: 05/10/2022 CLINICAL DATA:  Acute neurologic deficit EXAM: CT HEAD WITHOUT CONTRAST CT CERVICAL SPINE WITHOUT CONTRAST TECHNIQUE: Multidetector CT imaging of the head and cervical spine was performed following the standard protocol without intravenous contrast. Multiplanar CT image reconstructions of the cervical spine were also generated. RADIATION DOSE REDUCTION: This exam was performed according to the departmental dose-optimization program which includes automated exposure control, adjustment of the mA and/or kV according to patient size and/or use of iterative reconstruction technique. COMPARISON:   None Available. FINDINGS: CT HEAD FINDINGS Brain: There is no mass, hemorrhage  or extra-axial collection. Generalized atrophy. Multiple old small vessel infarcts of the cerebellum. There is hypoattenuation of the periventricular white matter, most commonly indicating chronic ischemic microangiopathy. Old right frontal and parietal infarcts. Vascular: No abnormal hyperdensity of the major intracranial arteries or dural venous sinuses. No intracranial atherosclerosis. Skull: The visualized skull base, calvarium and extracranial soft tissues are normal. Sinuses/Orbits: No fluid levels or advanced mucosal thickening of the visualized paranasal sinuses. No mastoid or middle ear effusion. The orbits are normal. ASPECTS ([place] Stroke Program Early CT Score) - Ganglionic level infarction (caudate, lentiform nuclei, internal capsule, insula, M1-M3 cortex): 7 - Supraganglionic infarction (M4-M6 cortex): 3 Total score (0-10 with 10 being normal): 10 CT CERVICAL SPINE FINDINGS Alignment: No static subluxation. Facets are aligned. Occipital condyles and the lateral masses of C1 and C2 are normally approximated. Skull base and vertebrae: No acute fracture. Soft tissues and spinal canal: No prevertebral fluid or swelling. No visible canal hematoma. Disc levels: No advanced spinal canal or neural foraminal stenosis. Upper chest: No pneumothorax, pulmonary nodule or pleural effusion. Other: Normal visualized paraspinal cervical soft tissues. IMPRESSION: 1. No acute intracranial abnormality. 2. ASPECTS is 10. 3. No acute fracture or static subluxation of the cervical spine. These results were communicated to Dr. Kerney Elbe at 12:24 am on 05/10/2022 by text page via the [facility] messaging system. Electronically Signed   By: Ulyses Jarred M.D.   On: 05/10/2022 00:47   CT ANGIO HEAD NECK W WO CM (CODE STROKE)  Result Date: 05/10/2022 CLINICAL DATA:  Acute neurologic deficit EXAM: CT ANGIOGRAPHY HEAD AND NECK TECHNIQUE: Multidetector  CT imaging of the head and neck was performed using the standard protocol during bolus administration of intravenous contrast. Multiplanar CT image reconstructions and MIPs were obtained to evaluate the vascular anatomy. Carotid stenosis measurements (when applicable) are obtained utilizing NASCET criteria, using the distal internal carotid diameter as the denominator. RADIATION DOSE REDUCTION: This exam was performed according to the departmental dose-optimization program which includes automated exposure control, adjustment of the mA and/or kV according to patient size and/or use of iterative reconstruction technique. COMPARISON:  None Available. FINDINGS: CTA NECK FINDINGS SKELETON: There is no bony spinal canal stenosis. No lytic or blastic lesion. OTHER NECK: 12 mm right parotid nodule (series 7 image 81). UPPER CHEST: No pneumothorax or pleural effusion. No nodules or masses. AORTIC ARCH: There is calcific atherosclerosis of the aortic arch. There is no aneurysm, dissection or hemodynamically significant stenosis of the visualized portion of the aorta. Normal variant aortic arch branching pattern with the left vertebral artery arising independently from the aortic arch. The visualized proximal subclavian arteries are widely patent. RIGHT CAROTID SYSTEM: Atherosclerotic web at the bifurcation. Mixed density plaque with less than 50% stenosis. Widely patent ICA. LEFT CAROTID SYSTEM: Calcific atherosclerosis at the bifurcation without hemodynamically significant stenosis. VERTEBRAL ARTERIES: Right dominant configuration. Both origins are clearly patent. There is no dissection, occlusion or flow-limiting stenosis to the skull base (V1-V3 segments). CTA HEAD FINDINGS POSTERIOR CIRCULATION: --Vertebral arteries: Right V4 segment atherosclerosis with widely maintained patency. --Inferior cerebellar arteries: Normal. --Basilar artery: Normal. --Superior cerebellar arteries: Normal. --Posterior cerebral arteries (PCA):  Normal. ANTERIOR CIRCULATION: --Intracranial internal carotid arteries: Atherosclerotic calcification of the internal carotid arteries at the skull base without hemodynamically significant stenosis. --Anterior cerebral arteries (ACA): Normal. Both A1 segments are present. Patent anterior communicating artery (a-comm). --Middle cerebral arteries (MCA): There is occlusion of the proximal M2 segment inferior division of the right MCA (series 10, image 114). Left  MCA is normal. VENOUS SINUSES: As permitted by contrast timing, patent. ANATOMIC VARIANTS: Fetal origin of the right posterior cerebral artery. Review of the MIP images confirms the above findings. IMPRESSION: 1. Occlusion of the proximal M2 segment inferior division of the right MCA. 2. 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 3. Nonspecific 12 mm right parotid nodule. 4. Critical Value/emergent results were called by telephone at the time of interpretation on 05/10/2022 at 12:38 am to provider ERIC [facility] , who verbally acknowledged these results. By Electronically Signed   By: Ulyses Jarred M.D.   On: 05/10/2022 00:39     PHYSICAL EXAM  Temp:  [97.7 F (36.5 C)-98.4 F (36.9 C)] 98.1 F (36.7 C) (02/24 0400) Pulse Rate:  [68-86] 72 (02/24 0600) Resp:  [13-22] 19 (02/24 0600) BP: (128-175)/(68-103) 151/82 (02/24 0600) SpO2:  [89 %-96 %] 95 % (02/24 0600)  General - Well nourished, well developed, in no apparent distress.  Ophthalmologic - fundi not visualized due to noncooperation.  Cardiovascular - irregularly irregular heart rate and rhythm.  Neuro - awake, alert, eyes open, orientated to name, place, time. No aphasia but moderate dysarthria, following all simple commands.  Able to name and repeat with dysarthric voice.  Right gaze preference and barely cross midline but able to do so, tracking on the right visual field. Left visual field cut made worse due to neglect.  Left facial droop. Tongue  protrusion midline.  Motor: LUE deltoid 3-/5, bicep and tricep 3+/5, finger grip 0/5.  Left LE proximal 3/5, distal 4/5.  RUE 5/5 and RLE 5/5.   Left sensory decreased with neglect.   Gait not tested.    ASSESSMENT/PLAN Mr. RODRIGO PEZZULLO is a 89 y.o. male with history of A-fib on Xarelto, hyperlipidemia, stroke in 05/2013 admitted for left-sided weakness and he fell at home. No tPA given due to outside window.    Stroke:  right large MCA infarct due to right M2 occlusion status post IR with  [id number], embolic secondary to A-fib given on Xarelto  CT no acute abnormality CTA head and neck right M2 proximal occlusion, right ICA bulb atherosclerosis IR with right M2 distal occlusion. S/p 4 passes, TICI2c  MRI large right MCA infarct MRA persistent right MCA occlusion although motion degraded 2D Echo EF 60 to 65% LDL 56 HgbA1c 5.8 Heparin subcu for VTE prophylaxis Xarelto (rivaroxaban) daily prior to admission, now on ASA 325 given large size of infarct. Will switch to eliquis on 2/27 if neuro stable.  Ongoing aggressive stroke risk factor management Therapy recommendations: CIR Disposition: Pending  History of stroke 05/2013 admitted for left-sided weakness and imbalance.  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  MRA/TTE/carotid Doppler negative.  Discharged on aspirin and Zocor. Outpatient 30-day CardioNet monitoring negative and loop recorder placed.  Found to have A-fib in 12/2014, started on Xarelto.  Mild left sided residual at baseline.  Chronic A-fib On Xarelto at home Currently [facility] on hold given large size of infarct Now on ASA 325 will switch to Eliquis on 2/27 if neuro stable.   Hypertension Home meds: toprol 12.5 Stable on the high end BP goal < 180/105 Off cleviprex Put on amlodipine and increase home toprol from 12.5 to 25 Long term BP goal normotensive  Hyperlipidemia Home meds: Zocor 40 LDL 56, goal < 70 Now on lipitor 20 (zocor 40  equivalent, as amlodipine can increase the risk of rhabdo when given with Zocor )  No high intensity statin given LDL at goal Continue statin at discharge  Dysphagia  Pass swallow On dys 1  puree and nectar thick liquid D/C IVF Encourage po intake  Other Stroke Risk Factors Advanced age Obesity, Body mass index is 30.68 kg/m.   Other Active Problems CKD 3A, creatinine 1.30-1.43-1.13-1.03 Mild leukocytosis, WBC 11.6-8.7-7.4 Insomnia - stop Ambien due to confusion. Start trazadone prn.  Hospital day # 3    05/13/2022 9:10 AM    To contact Stroke Continuity provider, please refer to [email or web address] After hours, contact General Neurology

## 2022-05-14 DIAGNOSIS — I63411 Cerebral infarction due to embolism of right middle cerebral artery: Secondary | ICD-10-CM | POA: Diagnosis not present

## 2022-05-14 LAB — CBC
HCT: 44.1 % (ref 39.0–52.0)
Hemoglobin: 15.7 g/dL (ref 13.0–17.0)
MCH: 34.1 pg — ABNORMAL HIGH (ref 26.0–34.0)
MCHC: 35.6 g/dL (ref 30.0–36.0)
MCV: 95.7 fL (ref 80.0–100.0)
Platelets: 163 10*3/uL (ref 150–400)
RBC: 4.61 MIL/uL (ref 4.22–5.81)
RDW: 13.4 % (ref 11.5–15.5)
WBC: 7 10*3/uL (ref 4.0–10.5)
nRBC: 0 % (ref 0.0–0.2)

## 2022-05-14 LAB — BASIC METABOLIC PANEL
Anion gap: 9 (ref 5–15)
BUN: 26 mg/dL — ABNORMAL HIGH (ref 8–23)
CO2: 25 mmol/L (ref 22–32)
Calcium: 9 mg/dL (ref 8.9–10.3)
Chloride: 101 mmol/L (ref 98–111)
Creatinine, Ser: 1.26 mg/dL — ABNORMAL HIGH (ref 0.61–1.24)
GFR, Estimated: 56 mL/min — ABNORMAL LOW (ref >60)
Glucose, Bld: 111 mg/dL — ABNORMAL HIGH (ref 70–99)
Potassium: 4 mmol/L (ref 3.5–5.1)
Sodium: 135 mmol/L (ref 135–145)

## 2022-05-14 MED ORDER — SODIUM CHLORIDE 0.9 % NICU IV INFUSION SIMPLE
INJECTION | INTRAVENOUS | Status: DC
  Filled 2022-05-14 (×6): qty 500

## 2022-05-14 NOTE — Progress Notes (Addendum)
STROKE TEAM PROGRESS NOTE   SUBJECTIVE (INTERVAL HISTORY)  His wife is at bedside.  He did not sleep well overnight according to the nurse he is seems to be resting quietly this morning.  He did not receive trazodone at night as of is not needed per nurse.  Other than the discomfort being in bed he has no new complaints.  No new weakness.  Slight increase in his creatinine , will start low-dose fluids  He would benefit from getting out of bed and sitting in the chair discussed with the nurse today.  OBJECTIVE Temp:  [98.5 F (36.9 C)-99 F (37.2 C)] 98.7 F (37.1 C) (02/25 0825) Pulse Rate:  [68-77] 75 (02/25 0825) Cardiac Rhythm: Heart block;Bundle branch block (02/25 0829) Resp:  [17-18] 18 (02/25 0357) BP: (113-163)/(66-81) 163/74 (02/25 0825) SpO2:  [94 %-96 %] 94 % (02/25 0825)  Recent Labs  Lab 05/10/22 0003  GLUCAP 135*    Recent Labs  Lab 05/10/22 0005 05/10/22 0010 05/10/22 0545 05/11/22 1246 05/12/22 0631 05/14/22 0533  NA 137 139 135 135 137 135  K 3.5 3.7 3.8 3.9 3.8 4.0  CL 103 103 103 103 102 101  CO2 21*  --  '22 27 23 25  '$ GLUCOSE 158* 157* 183* 123* 122* 111*  BUN 20 25* '19 19 19 '$ 26*  CREATININE 1.33* 1.30* 1.43* 1.13 1.03 1.26*  CALCIUM 9.2  --  8.6* 8.5* 8.7* 9.0  PHOS  --   --   --  2.4*  --   --     Recent Labs  Lab 05/10/22 0005 05/11/22 1246  AST 27  --   ALT 20  --   ALKPHOS 93  --   BILITOT 0.4  --   PROT 7.0  --   ALBUMIN 4.1 3.5    Recent Labs  Lab 05/10/22 0005 05/10/22 0010 05/10/22 0545 05/11/22 1246 05/12/22 0631 05/14/22 0533  WBC 9.2  --  11.6* 8.7 7.4 7.0  NEUTROABS 6.6  --  9.2*  --   --   --   HGB 16.7 16.7 16.0 13.7 14.5 15.7  HCT 49.6 49.0 46.7 39.4 43.0 44.1  MCV 98.4  --  97.3 97.3 98.2 95.7  PLT 141*  --  164 144* 146* 163    No results for input(s): "CKTOTAL", "CKMB", "CKMBINDEX", "TROPONINI" in the last 168 hours. No results for input(s): "LABPROT", "INR" in the last 72 hours.  No results for  input(s): "COLORURINE", "LABSPEC", "PHURINE", "GLUCOSEU", "HGBUR", "BILIRUBINUR", "KETONESUR", "PROTEINUR", "UROBILINOGEN", "NITRITE", "LEUKOCYTESUR" in the last 72 hours.  Invalid input(s): "APPERANCEUR"     Component Value Date/Time   CHOL 134 05/10/2022 0545   TRIG 190 (H) 05/10/2022 0545   HDL 40 (L) 05/10/2022 0545   CHOLHDL 3.4 05/10/2022 0545   VLDL 38 05/10/2022 0545   LDLCALC 56 05/10/2022 0545   Lab Results  Component Value Date   HGBA1C 5.8 (H) 05/10/2022   No results found for: "LABOPIA", "COCAINSCRNUR", "LABBENZ", "AMPHETMU", "THCU", "LABBARB"  Recent Labs  Lab 05/10/22 0005  ETH <10     I have personally reviewed the radiological images below and agree with the radiology interpretations.  DG Swallowing Func-Speech Pathology  Result Date: 05/11/2022 Table formatting from the original result was not included. Modified Barium Swallow Study Patient Details Name: Edward Maynard MRN: [id number] Date of Birth: [birth year] Today's Date: 05/11/2022 HPI/PMH: HPI: 89 year old male with atrial fibrillation (on Xarelto), prior stroke, presenting with acute onset of left hemiplegia and  left hemineglect after sustaining a fall to the left while at home this evening. Underwent thrombectomy Clinical Impression: Clinical Impression: Pt demonstrates moderate to severe oral dysphagia witn left CN VII weakness and sensory impairment leading to severe anterior spillage with cup sips of liquids. Lingual, buccal and palatal residue present with most trials. Thin residue spills to pharynx post swallow. There is delayed swallow initaition with all liquids. No aspiration of thin occurred until pt attempted consecutive straw sips (PAS 7); however, there is higher risk with thin and severe anterior spillage. Nectar was better controlled orally and with a straw, which reduced oral residue and spillage. Pt unable to masticate solids without dentures and lingual manipulation of bolus very poor. Recommend  purees and nectar thick liquids while pt learns strategies and improves awareness. Can quickly upgrade to thin as pt demonstrates awareness and control without severe coughing Factors that may increase risk of adverse event in presence of aspiration ([place] 2021): Factors that may increase risk of adverse event in presence of aspiration ([place] 2021): Limited mobility; Reduced cognitive function Recommendations/Plan: Swallowing Evaluation Recommendations Swallowing Evaluation Recommendations Liquid Administration via: Straw; Cup Medication Administration: Whole meds with puree Supervision: Full supervision/cueing for swallowing strategies Swallowing strategies  : Slow rate; Small bites/sips; Minimize environmental distractions; Check for anterior loss Postural changes: Stay upright 30-60 min after meals Treatment Plan Treatment Plan Treatment recommendations: Therapy as outlined in treatment plan below Follow-up recommendations: Acute inpatient rehab (3 hours/day) Functional status assessment: Patient has had a recent decline in their functional status and demonstrates the ability to make significant improvements in function in a reasonable and predictable amount of time. Treatment frequency: Min 2x/week Treatment duration: 2 weeks Interventions: Aspiration precaution training; Diet toleration management by SLP; Trials of upgraded texture/liquids; Patient/family education; Compensatory techniques Recommendations Recommendations for follow up therapy are one component of a multi-disciplinary discharge planning process, led by the attending physician.  Recommendations may be updated based on patient status, additional functional criteria and insurance authorization. Assessment: Orofacial Exam: Orofacial Exam Oral Cavity - Dentition: Dentures, top; Dentures, bottom Anatomy: Anatomy: WFL Thin Liquids: Thin Liquids (Level 0) Thin Liquids : Impaired Bolus delivery method: Cup Thin Liquid - Impairment:  Oral Impairment; Pharyngeal impairment Lip Closure: Escape beyond mid-chin Tongue control during bolus hold: Escape to lateral buccal cavity/floor of mouth Bolus transport/lingual motion: Repetitive/disorganized tongue motion Oral residue: Residue collection on oral structures Location of oral residue : Floor of mouth; Lateral sulci; Tongue Initiation of swallow : Pyriform sinuses Soft palate elevation: No bolus between soft palate (SP)/pharyngeal wall (PW) Laryngeal elevation: Complete superior movement of thyroid cartilage with complete approximation of arytenoids to epiglottic petiole Anterior hyoid excursion: Complete Epiglottic movement: Complete Laryngeal vestibule closure: Complete, no air/contrast in laryngeal vestibule Pharyngeal stripping wave : Present - complete Pharyngeal contraction (A/P view only): N/A Pharyngoesophageal segment opening: Complete distension and complete duration, no obstruction of flow Tongue base retraction: Trace column of contrast or air between tongue base and PPW Pharyngeal residue: Trace residue within or on pharyngeal structures Location of pharyngeal residue: Valleculae Penetration/Aspiration Scale (PAS) score: 7.  Material enters airway, passes BELOW cords and not ejected out despite cough attempt by patient; 1.  Material does not enter airway (aspiration with straw only)  Mildly Thick Liquids: Mildly thick liquids (Level 2, nectar thick) Mildly thick liquids (Level 2, nectar thick): Impaired Bolus delivery method: Cup Mildly Thick Liquid - Impairment: Oral Impairment; Pharyngeal impairment Lip Closure: Escape beyond mid-chin Tongue control during bolus  hold: Escape to lateral buccal cavity/floor of mouth Bolus transport/lingual motion: Repetitive/disorganized tongue motion Oral residue: Residue collection on oral structures Location of oral residue : Lateral sulci; Floor of mouth Initiation of swallow : Pyriform sinuses Soft palate elevation: No bolus between soft palate  (SP)/pharyngeal wall (PW) Laryngeal elevation: Complete superior movement of thyroid cartilage with complete approximation of arytenoids to epiglottic petiole Anterior hyoid excursion: Complete Epiglottic movement: Complete Laryngeal vestibule closure: Complete, no air/contrast in laryngeal vestibule Pharyngeal stripping wave : Present - complete Pharyngeal contraction (A/P view only): Complete; N/A Pharyngoesophageal segment opening: Complete distension and complete duration, no obstruction of flow Tongue base retraction: Trace column of contrast or air between tongue base and PPW Pharyngeal residue: Trace residue within or on pharyngeal structures Location of pharyngeal residue: Valleculae  Moderately Thick Liquids: Moderately thick liquids (Level 3, honey thick) Moderately thick liquids (Level 3, honey thick): Impaired Bolus delivery method: Spoon Moderately Thick Liquid - Impairment: Oral Impairment Lip Closure: Escape from interlabial space or lateral juncture, no extension beyond vermillion border Tongue control during bolus hold: Escape to lateral buccal cavity/floor of mouth Bolus transport/lingual motion: Repetitive/disorganized tongue motion Oral residue: Residue collection on oral structures Location of oral residue : Floor of mouth; Lateral sulci Initiation of swallow : Posterior laryngeal surface of the epiglottis  Puree: Puree Puree: Impaired Puree - Impairment: Oral Impairment Lip Closure: Interlabial escape, no progression to anterior lip Bolus transport/lingual motion: Repetitive/disorganized tongue motion Oral residue: Trace residue lining oral structures Location of oral residue : Tongue Initiation of swallow: Valleculae Solid: Solid Solid: Impaired Solid - Impairment: Oral Impairment Lip Closure: Escape beyond mid-chin Bolus preparation/mastication: Minimal chewing/mashing with majority of bolus unchewed Bolus transport/lingual motion: Repetitive/disorganized tongue motion Oral residue: Majority  of bolus remaining Location of oral residue : Lateral sulci Pill: Pill Pill: Not Tested Compensatory Strategies: Compensatory Strategies Compensatory strategies: Yes Straw: Effective Effective Straw: Mildly thick liquid (Level 2, nectar thick) (aspiration with thin)   General Information: Caregiver present: No  Diet Prior to this Study: NPO   No data recorded  No data recorded  Supplemental O2: None (Room air)   History of Recent Intubation: No  Behavior/Cognition: Alert; Cooperative; Pleasant mood No data recorded Baseline vocal quality/speech: Normal Volitional Cough: Able to elicit Volitional Swallow: Able to elicit No data recorded Goal Planning: Prognosis for improved oropharyngeal function: Good Barriers to Reach Goals: Cognitive deficits No data recorded No data recorded Consulted and agree with results and recommendations: Patient; Family member/caregiver Pain: Pain Assessment Pain Assessment: Faces Faces Pain Scale: 0 Pain Intervention(s): Monitored during session End of Session: Start Time:SLP Start Time (ACUTE ONLY): 1345 Stop Time: SLP Stop Time (ACUTE ONLY): 1405 Time Calculation:SLP Time Calculation (min) (ACUTE ONLY): 20 min Charges: SLP Evaluations $ SLP Speech Visit: 1 Visit SLP Evaluations $BSS Swallow: 1 Procedure $MBS Swallow: 1 Procedure $ SLP EVAL LANGUAGE/SOUND PRODUCTION: 1 Procedure $Swallowing Treatment: 1 Procedure $Speech Treatment for Individual: 1 Procedure SLP visit diagnosis: SLP Visit Diagnosis: Dysphagia, oropharyngeal phase (R13.12) Past Medical History: Past Medical History: Diagnosis Date  High cholesterol   05/2013; no defecits  Hyperlipemia   Left arm weakness   Left hand weakness   Personal history of colonic polyp - adenoma  10/23/2013  Stroke ([place]) 05/2013  MRI on 07/02/13 = Multifocal acute & subacute infarction involving right frontal MCA/ACA & left parietal MCA/PCA watershed areas Past Surgical History: Past Surgical History: Procedure Laterality Date  IR CT HEAD LTD   05/10/2022  IR PERCUTANEOUS ART THROMBECTOMY/INFUSION  INTRACRANIAL INC DIAG ANGIO  05/10/2022  LOOP RECORDER IMPLANT N/A 10/31/2013  Procedure: LOOP RECORDER IMPLANT;  Surgeon: Coralyn Mark, MD;  Location: [facility];  Service: Cardiovascular;  Laterality: N/A;  RADIOLOGY WITH ANESTHESIA N/A 05/10/2022  Procedure: IR WITH ANESTHESIA;  Surgeon: Luanne Bras, MD;  Location: [facility];  Service: Radiology;  Laterality: N/A;  SHOULDER SURGERY Bilateral 2003, 1995 [place], Katherene Ponto 05/11/2022, 2:20 PM  IR PERCUTANEOUS ART THROMBECTOMY/INFUSION INTRACRANIAL INC DIAG ANGIO  Result Date: 05/11/2022 INDICATION: Worsening left-sided weakness and right gaze deviation. Occluded distal M2 segment of the inferior division of the right middle cerebral artery on CT angiogram of the head and neck. EXAM: 1. EMERGENT LARGE VESSEL OCCLUSION THROMBOLYSIS anterior CIRCULATION) COMPARISON:  CT angiogram of the head and neck of May 10, 2022. MEDICATIONS: Ancef 2 g IV antibiotic was administered within 1 hour of the procedure. ANESTHESIA/SEDATION: General anesthesia. CONTRAST:  Omnipaque 300 approximately 70 mL. FLUOROSCOPY TIME:  Fluoroscopy Time: 41 minutes 16 seconds (2313 mGy). COMPLICATIONS: None immediate. TECHNIQUE: Following a full explanation of the procedure along with the potential associated complications, an informed witnessed consent was obtained. The risks of intracranial hemorrhage of 10%, worsening neurological deficit, ventilator dependency, death and inability to revascularize were all reviewed in detail with the patient's spouse. The patient was then put under general anesthesia by the Department of Anesthesiology at [facility]. The right groin was prepped and draped in the usual sterile fashion. Thereafter using modified Seldinger technique, transfemoral access into the right common femoral artery was obtained without difficulty. Over a 0.035 inch guidewire an 8 French 25 cm Pinnacle sheath was  inserted. Through this a combination of a 125 cm 5 [place] Berenstein support catheter inside of an 087 95 cm balloon guide catheter was advanced initially proximal to the right common carotid bifurcation and then into the distal right internal carotid artery. FINDINGS: The right common carotid bifurcation demonstrates the right external carotid artery and its major branches to be widely patent. The right internal carotid artery at the bulb demonstrates a smooth shallow plaque along the posterior wall associated with 10% stenosis by the NASCET criteria. Distal to this the right internal carotid artery demonstrates opacification to the cranial skull base. The petrous, the cavernous and the supraclinoid right ICA demonstrate wide patency. A right posterior communicating artery is seen opacifying the right posterior cerebral artery distribution. The right anterior cerebral artery opacifies into the capillary and venous phases. The right middle cerebral artery M1 segment is widely patent. There is an occlusion of a parietal branch of the inferior division of the right middle cerebral artery in the distal M3 region. Delayed arterial and capillary phase demonstrates significant retrograde opacification of the distal right parietal cortical and subcortical distribution from the anterior cerebral and the P3 leptomeningeal branches of the right posterior cerebral artery. PROCEDURE: Through the balloon guide catheter in the distal right internal carotid artery, a combination of an 045 Zoom aspiration catheter with a 160 cm Trevo microcatheter was advanced over an 014 inch soft tip micro guidewire with a moderate J configuration to the supraclinoid right ICA. The micro guidewire was then gently manipulated with a torque device and advanced without difficulty distal to the occluded branch of the inferior division into the M3 region followed by the microcatheter. A 3 mm x 20 mm Solitaire X retrieval device was then deployed  following verification of safe positioning of the tip of the microcatheter. This was then followed by the advancement of the  Zoom aspiration catheter which was engaged just inside the occluded portion of the vessel, the micro guidewire and the microcatheter were retrieved proximally. Following 2 minutes of contact aspiration with a Penumbra aspiration device and proximal flow arrest, the Zoom aspiration catheter was removed. Two approximately 1 mm pearly sticky clots were retrieved. A control arteriogram demonstrated no significant change in the occluded parietal branch. A second pass was then made using the above combination. The microcatheter was advanced past the occlusion into the M3 region with the micro guidewire. The guidewire was removed. Good aspiration obtained from the hub of the microcatheter which was then connected to continuous heparinized saline infusion. The Zoom aspiration catheter was then engaged just inside the occluded segment of the vessel. A 3 mm x 20 mm Solitaire X retrieval device was then deployed in the usual manner. Thereafter, constant aspiration was applied with proximal flow arrest the hub of the Zoom aspiration catheter for a minute and a half. Following this the combination of the retrieval device, the microcatheter and the aspiration catheter were removed. A control arteriogram performed through the balloon guide catheter following reversal of flow arrest now demonstrated improved flow through the occluded branch though slow to the M3 M4 regions. A third pass was then made using a combination using an 035 inch aspiration catheter advanced over an 014 inch soft tip micro guidewire with a moderate J configuration to the right middle cerebral artery. Access into the now reoccluded inferior division M2 branch was then made with the micro guidewire followed by the microcatheter into the distal M3 region. The guidewire was removed. The 035 inch Zoom aspiration was then engaged into the  clot with no aspiration obtained. Aspiration was then applied with a Penumbra aspiration device for approximately 2 minutes with proximal flow arrest. The aspiration catheter was removed. A control arteriogram performed through the balloon guide catheter demonstrated mild improvement in revascularization of the occluded branch with the clot noted slightly more distally. A fourth pass was then made this time again using an 045 Zoom aspiration catheter advanced with an 021 microcatheter and over 014 inch micro guidewire. This combination was then advanced into the M3 region of the occluded branch followed by the microcatheter. The guidewire was removed. Good aspiration obtained from the hub of the microcatheter which was positioned distally in the M3 segment. This was then connected to continuous heparinized saline infusion. A 4 mm x 40 mm Solitaire X retrieval stent was then deployed in the usual manner with the 045 Zoom aspiration catheter imbedded in the occluded branch. Aspiration was then for approximately 2 minutes at the hub of the aspiration catheter with proximal flow arrest. Thereafter, the retriever, the microcatheter and the Zoom aspiration catheter were removed. A control arteriogram performed following reversal of flow arrest in the right internal carotid artery, continued to demonstrate slow antegrade flow distal to the occluded segment. A control arteriogram performed through the balloon guide catheter continued to demonstrate a TICI 2C over revascularization with only mild distal flow past the occluded M2 branch of the right inferior division MCA. No further passes were made due to now steadily increased risk of vessel injury perforation. The balloon guide was retrieved in the right common carotid artery. A control arteriogram performed through this continued to demonstrate patency of right internal carotid artery proximally and distally including that of the right posterior communicating artery, the  right anterior cerebral artery and the right middle cerebral artery distributions. An 8 French Angio-Seal closure device was  then deployed in the right groin puncture site for hemostasis. Distal pulses remained Dopplerable in both feet unchanged from prior to the procedure. A flat panel CT of the brain demonstrated no evidence of intra cerebral hemorrhage. The patient's general anesthesia was reversed, and patient was extubated. Initially agitated, the patient gradually settled down to where simple commands appropriately and verbalized appropriately. The patient moved his left lower extremities spontaneously and to command with good strength. Slight movement was evident in the left upper extremity. The patient was then transferred to the neuro ICU for post revascularization management. IMPRESSION: Status post endovascular revascularization of occluded distal M2 branch of the right middle cerebral artery inferior division with 2 passes with a 3 mm x 20 mm retrieval device and contact aspiration, 1 pass with a 4 mm x 40 mm Solitaire X retrieval device and contact aspiration, 1 pass with contact aspiration with minimal improvement in revascularization of the occluded right MCA inferior division M2 segment occlusion. A TICI 2C revascularization was maintained. PLAN: Follow-up as per referring MD. Electronically Signed   By: Luanne Bras M.D.   On: 05/11/2022 08:28   IR CT Head Ltd  Result Date: 05/11/2022 INDICATION: Worsening left-sided weakness and right gaze deviation. Occluded distal M2 segment of the inferior division of the right middle cerebral artery on CT angiogram of the head and neck. EXAM: 1. EMERGENT LARGE VESSEL OCCLUSION THROMBOLYSIS anterior CIRCULATION) COMPARISON:  CT angiogram of the head and neck of May 10, 2022. MEDICATIONS: Ancef 2 g IV antibiotic was administered within 1 hour of the procedure. ANESTHESIA/SEDATION: General anesthesia. CONTRAST:  Omnipaque 300 approximately 70 mL.  FLUOROSCOPY TIME:  Fluoroscopy Time: 41 minutes 16 seconds (2313 mGy). COMPLICATIONS: None immediate. TECHNIQUE: Following a full explanation of the procedure along with the potential associated complications, an informed witnessed consent was obtained. The risks of intracranial hemorrhage of 10%, worsening neurological deficit, ventilator dependency, death and inability to revascularize were all reviewed in detail with the patient's spouse. The patient was then put under general anesthesia by the Department of Anesthesiology at [facility]. The right groin was prepped and draped in the usual sterile fashion. Thereafter using modified Seldinger technique, transfemoral access into the right common femoral artery was obtained without difficulty. Over a 0.035 inch guidewire an 8 French 25 cm Pinnacle sheath was inserted. Through this a combination of a 125 cm 5 [place] Berenstein support catheter inside of an 087 95 cm balloon guide catheter was advanced initially proximal to the right common carotid bifurcation and then into the distal right internal carotid artery. FINDINGS: The right common carotid bifurcation demonstrates the right external carotid artery and its major branches to be widely patent. The right internal carotid artery at the bulb demonstrates a smooth shallow plaque along the posterior wall associated with 10% stenosis by the NASCET criteria. Distal to this the right internal carotid artery demonstrates opacification to the cranial skull base. The petrous, the cavernous and the supraclinoid right ICA demonstrate wide patency. A right posterior communicating artery is seen opacifying the right posterior cerebral artery distribution. The right anterior cerebral artery opacifies into the capillary and venous phases. The right middle cerebral artery M1 segment is widely patent. There is an occlusion of a parietal branch of the inferior division of the right middle cerebral artery in the distal M3  region. Delayed arterial and capillary phase demonstrates significant retrograde opacification of the distal right parietal cortical and subcortical distribution from the anterior cerebral and the P3 leptomeningeal branches  of the right posterior cerebral artery. PROCEDURE: Through the balloon guide catheter in the distal right internal carotid artery, a combination of an 045 Zoom aspiration catheter with a 160 cm Trevo microcatheter was advanced over an 014 inch soft tip micro guidewire with a moderate J configuration to the supraclinoid right ICA. The micro guidewire was then gently manipulated with a torque device and advanced without difficulty distal to the occluded branch of the inferior division into the M3 region followed by the microcatheter. A 3 mm x 20 mm Solitaire X retrieval device was then deployed following verification of safe positioning of the tip of the microcatheter. This was then followed by the advancement of the Zoom aspiration catheter which was engaged just inside the occluded portion of the vessel, the micro guidewire and the microcatheter were retrieved proximally. Following 2 minutes of contact aspiration with a Penumbra aspiration device and proximal flow arrest, the Zoom aspiration catheter was removed. Two approximately 1 mm pearly sticky clots were retrieved. A control arteriogram demonstrated no significant change in the occluded parietal branch. A second pass was then made using the above combination. The microcatheter was advanced past the occlusion into the M3 region with the micro guidewire. The guidewire was removed. Good aspiration obtained from the hub of the microcatheter which was then connected to continuous heparinized saline infusion. The Zoom aspiration catheter was then engaged just inside the occluded segment of the vessel. A 3 mm x 20 mm Solitaire X retrieval device was then deployed in the usual manner. Thereafter, constant aspiration was applied with proximal flow  arrest the hub of the Zoom aspiration catheter for a minute and a half. Following this the combination of the retrieval device, the microcatheter and the aspiration catheter were removed. A control arteriogram performed through the balloon guide catheter following reversal of flow arrest now demonstrated improved flow through the occluded branch though slow to the M3 M4 regions. A third pass was then made using a combination using an 035 inch aspiration catheter advanced over an 014 inch soft tip micro guidewire with a moderate J configuration to the right middle cerebral artery. Access into the now reoccluded inferior division M2 branch was then made with the micro guidewire followed by the microcatheter into the distal M3 region. The guidewire was removed. The 035 inch Zoom aspiration was then engaged into the clot with no aspiration obtained. Aspiration was then applied with a Penumbra aspiration device for approximately 2 minutes with proximal flow arrest. The aspiration catheter was removed. A control arteriogram performed through the balloon guide catheter demonstrated mild improvement in revascularization of the occluded branch with the clot noted slightly more distally. A fourth pass was then made this time again using an 045 Zoom aspiration catheter advanced with an 021 microcatheter and over 014 inch micro guidewire. This combination was then advanced into the M3 region of the occluded branch followed by the microcatheter. The guidewire was removed. Good aspiration obtained from the hub of the microcatheter which was positioned distally in the M3 segment. This was then connected to continuous heparinized saline infusion. A 4 mm x 40 mm Solitaire X retrieval stent was then deployed in the usual manner with the 045 Zoom aspiration catheter imbedded in the occluded branch. Aspiration was then for approximately 2 minutes at the hub of the aspiration catheter with proximal flow arrest. Thereafter, the  retriever, the microcatheter and the Zoom aspiration catheter were removed. A control arteriogram performed following reversal of flow arrest in the right  internal carotid artery, continued to demonstrate slow antegrade flow distal to the occluded segment. A control arteriogram performed through the balloon guide catheter continued to demonstrate a TICI 2C over revascularization with only mild distal flow past the occluded M2 branch of the right inferior division MCA. No further passes were made due to now steadily increased risk of vessel injury perforation. The balloon guide was retrieved in the right common carotid artery. A control arteriogram performed through this continued to demonstrate patency of right internal carotid artery proximally and distally including that of the right posterior communicating artery, the right anterior cerebral artery and the right middle cerebral artery distributions. An 8 French Angio-Seal closure device was then deployed in the right groin puncture site for hemostasis. Distal pulses remained Dopplerable in both feet unchanged from prior to the procedure. A flat panel CT of the brain demonstrated no evidence of intra cerebral hemorrhage. The patient's general anesthesia was reversed, and patient was extubated. Initially agitated, the patient gradually settled down to where simple commands appropriately and verbalized appropriately. The patient moved his left lower extremities spontaneously and to command with good strength. Slight movement was evident in the left upper extremity. The patient was then transferred to the neuro ICU for post revascularization management. IMPRESSION: Status post endovascular revascularization of occluded distal M2 branch of the right middle cerebral artery inferior division with 2 passes with a 3 mm x 20 mm retrieval device and contact aspiration, 1 pass with a 4 mm x 40 mm Solitaire X retrieval device and contact aspiration, 1 pass with contact  aspiration with minimal improvement in revascularization of the occluded right MCA inferior division M2 segment occlusion. A TICI 2C revascularization was maintained. PLAN: Follow-up as per referring MD. Electronically Signed   By: Luanne Bras M.D.   On: 05/11/2022 08:28   MR BRAIN WO CONTRAST  Result Date: 05/10/2022 CLINICAL DATA:  Stroke, follow up EXAM: MRI HEAD WITHOUT CONTRAST MRA HEAD WITHOUT CONTRAST TECHNIQUE: Multiplanar, multi-echo pulse sequences of the brain and surrounding structures were acquired without intravenous contrast. Angiographic images of the Circle of Willis were acquired using MRA technique without intravenous contrast. COMPARISON:  CTA and CT head from same day. FINDINGS: MRI HEAD FINDINGS Motion limited study. Brain: Acute posterior right MCA territory infarct. Associated edema and regional mass effect without significant midline shift. No mass occupying acute hemorrhage. No mass lesion. No hydrocephalus. Remote infarct in the right cerebellum. Vascular: Detailed below. Skull and upper cervical spine: Normal marrow signal. Sinuses/Orbits: Clear sinuses.  No acute orbital findings. MRA HEAD FINDINGS Severely motion limited study. Anterior circulation: Bilateral intracranial ICAs, proximal ACAs and proximal M1 MCAs appear grossly patent. Essentially nondiagnostic evaluation of the M2 MCAs, including the region of occlusion seen on recent CTA. Posterior circulation: The intradural vertebral arteries, basilar arteries and proximal PCAs are grossly patent. IMPRESSION: 1. Acute right posterior MCA territory infarct. Associated edema without midline shift. 2. Severely motion limited MRA with nondiagnostic evaluation of the right M2 MCA in the region of recently seen occlusion on CTA. A repeat exclude exam may be able to better assess if the patient is able in clinically warranted. Electronically Signed   By: Margaretha Sheffield M.D.   On: 05/10/2022 16:03   MR ANGIO HEAD WO  CONTRAST  Result Date: 05/10/2022 CLINICAL DATA:  Stroke, follow up EXAM: MRI HEAD WITHOUT CONTRAST MRA HEAD WITHOUT CONTRAST TECHNIQUE: Multiplanar, multi-echo pulse sequences of the brain and surrounding structures were acquired without intravenous contrast. Angiographic images of  the Circle of Willis were acquired using MRA technique without intravenous contrast. COMPARISON:  CTA and CT head from same day. FINDINGS: MRI HEAD FINDINGS Motion limited study. Brain: Acute posterior right MCA territory infarct. Associated edema and regional mass effect without significant midline shift. No mass occupying acute hemorrhage. No mass lesion. No hydrocephalus. Remote infarct in the right cerebellum. Vascular: Detailed below. Skull and upper cervical spine: Normal marrow signal. Sinuses/Orbits: Clear sinuses.  No acute orbital findings. MRA HEAD FINDINGS Severely motion limited study. Anterior circulation: Bilateral intracranial ICAs, proximal ACAs and proximal M1 MCAs appear grossly patent. Essentially nondiagnostic evaluation of the M2 MCAs, including the region of occlusion seen on recent CTA. Posterior circulation: The intradural vertebral arteries, basilar arteries and proximal PCAs are grossly patent. IMPRESSION: 1. Acute right posterior MCA territory infarct. Associated edema without midline shift. 2. Severely motion limited MRA with nondiagnostic evaluation of the right M2 MCA in the region of recently seen occlusion on CTA. A repeat exclude exam may be able to better assess if the patient is able in clinically warranted. Electronically Signed   By: Margaretha Sheffield M.D.   On: 05/10/2022 16:03   ECHOCARDIOGRAM COMPLETE  Result Date: 05/10/2022    ECHOCARDIOGRAM REPORT   Patient Name:   ORA CRARY Date of Exam: 05/10/2022 Medical Rec #:  [id number]     Height:       71.5 in Accession #:    [id number]    Weight:       223.1 lb Date of Birth:  Aug 14, 1933    BSA:          2.220 m Patient Age:    39 years       BP:           160/90 mmHg Patient Gender: M             HR:           73 bpm. Exam Location:  Inpatient Procedure: 2D Echo, Cardiac Doppler and Color Doppler Indications:    Stroke I63.9  History:        Patient has prior history of Echocardiogram examinations and                 Patient has no prior history of Echocardiogram examinations.                 Pacemaker, Stroke, Arrythmias:PVC and Tachycardia; Risk                 Factors:Dyslipidemia.  Sonographer:    Ronny Flurry Referring Phys: [phone] ERIC LINDZEN  Sonographer Comments: Image acquisition challenging due to respiratory motion. IMPRESSIONS  1. Left ventricular ejection fraction, by estimation, is 60 to 65%. The left ventricle has normal function. The left ventricle has no regional wall motion abnormalities. Left ventricular diastolic parameters are consistent with Grade II diastolic dysfunction (pseudonormalization).  2. Right ventricular systolic function is normal. The right ventricular size is normal. There is normal pulmonary artery systolic pressure.  3. The mitral valve is normal in structure. No evidence of mitral valve regurgitation. No evidence of mitral stenosis.  4. The aortic valve is calcified. Aortic valve regurgitation is trivial. Mild aortic valve stenosis. Aortic valve area, by VTI measures 1.55 cm. Aortic valve mean gradient measures 19.0 mmHg. Aortic valve Vmax measures 2.94 m/s.  5. The inferior vena cava is normal in size with greater than 50% respiratory variability, suggesting right atrial pressure of 3 mmHg. FINDINGS  Left Ventricle: Left  ventricular ejection fraction, by estimation, is 60 to 65%. The left ventricle has normal function. The left ventricle has no regional wall motion abnormalities. The left ventricular internal cavity size was normal in size. There is  no left ventricular hypertrophy. Left ventricular diastolic parameters are consistent with Grade II diastolic dysfunction (pseudonormalization). Indeterminate  filling pressures. Right Ventricle: The right ventricular size is normal. No increase in right ventricular wall thickness. Right ventricular systolic function is normal. There is normal pulmonary artery systolic pressure. The tricuspid regurgitant velocity is 1.45 m/s, and  with an assumed right atrial pressure of 3 mmHg, the estimated right ventricular systolic pressure is [id number] mmHg. Left Atrium: Left atrial size was normal in size. Right Atrium: Right atrial size was normal in size. Pericardium: There is no evidence of pericardial effusion. Mitral Valve: The mitral valve is normal in structure. No evidence of mitral valve regurgitation. No evidence of mitral valve stenosis. Tricuspid Valve: The tricuspid valve is normal in structure. Tricuspid valve regurgitation is trivial. No evidence of tricuspid stenosis. Aortic Valve: The aortic valve is calcified. Aortic valve regurgitation is trivial. Mild aortic stenosis is present. Aortic valve mean gradient measures 19.0 mmHg. Aortic valve peak gradient measures 34.5 mmHg. Aortic valve area, by VTI measures 1.55 cm. Pulmonic Valve: The pulmonic valve was normal in structure. Pulmonic valve regurgitation is not visualized. No evidence of pulmonic stenosis. Aorta: The aortic root is normal in size and structure. Venous: The inferior vena cava is normal in size with greater than 50% respiratory variability, suggesting right atrial pressure of 3 mmHg. IAS/Shunts: No atrial level shunt detected by color flow Doppler.  LEFT VENTRICLE PLAX 2D LVIDd:         4.60 cm   Diastology LVIDs:         3.70 cm   LV e' medial:    7.80 cm/s LV PW:         1.30 cm   LV E/e' medial:  10.4 LV IVS:        1.20 cm   LV e' lateral:   11.60 cm/s LVOT diam:     2.20 cm   LV E/e' lateral: 7.0 LV SV:         89 LV SV Index:   40 LVOT Area:     3.80 cm  RIGHT VENTRICLE             IVC RV S prime:     16.60 cm/s  IVC diam: 2.10 cm TAPSE (M-mode): 1.5 cm LEFT ATRIUM             Index        RIGHT  ATRIUM           Index LA diam:        4.60 cm 2.07 cm/m   RA Area:     15.10 cm LA Vol (A2C):   64.7 ml 29.14 ml/m  RA Volume:   31.80 ml  14.32 ml/m LA Vol (A4C):   58.7 ml 26.44 ml/m LA Biplane Vol: 62.9 ml 28.33 ml/m  AORTIC VALVE AV Area (Vmax):    1.61 cm AV Area (Vmean):   1.68 cm AV Area (VTI):     1.55 cm AV Vmax:           293.50 cm/s AV Vmean:          189.800 cm/s AV VTI:            0.570 m AV Peak Grad:  34.5 mmHg AV Mean Grad:      19.0 mmHg LVOT Vmax:         124.00 cm/s LVOT Vmean:        83.833 cm/s LVOT VTI:          0.233 m LVOT/AV VTI ratio: 0.41  AORTA Ao Root diam: 3.30 cm MITRAL VALVE               TRICUSPID VALVE MV Area (PHT): 4.68 cm    TR Peak grad:   8.4 mmHg MV Decel Time: 162 msec    TR Vmax:        145.00 cm/s MV E velocity: 81.00 cm/s MV A velocity: 65.50 cm/s  SHUNTS MV E/A ratio:  1.24        Systemic VTI:  0.23 m                            Systemic Diam: 2.20 cm Skeet Latch MD Electronically signed by Skeet Latch MD Signature Date/Time: 05/10/2022/10:48:39 AM    Final    CT HEAD CODE STROKE WO CONTRAST  Result Date: 05/10/2022 CLINICAL DATA:  Acute neurologic deficit EXAM: CT HEAD WITHOUT CONTRAST CT CERVICAL SPINE WITHOUT CONTRAST TECHNIQUE: Multidetector CT imaging of the head and cervical spine was performed following the standard protocol without intravenous contrast. Multiplanar CT image reconstructions of the cervical spine were also generated. RADIATION DOSE REDUCTION: This exam was performed according to the departmental dose-optimization program which includes automated exposure control, adjustment of the mA and/or kV according to patient size and/or use of iterative reconstruction technique. COMPARISON:  None Available. FINDINGS: CT HEAD FINDINGS Brain: There is no mass, hemorrhage or extra-axial collection. Generalized atrophy. Multiple old small vessel infarcts of the cerebellum. There is hypoattenuation of the periventricular white matter,  most commonly indicating chronic ischemic microangiopathy. Old right frontal and parietal infarcts. Vascular: No abnormal hyperdensity of the major intracranial arteries or dural venous sinuses. No intracranial atherosclerosis. Skull: The visualized skull base, calvarium and extracranial soft tissues are normal. Sinuses/Orbits: No fluid levels or advanced mucosal thickening of the visualized paranasal sinuses. No mastoid or middle ear effusion. The orbits are normal. ASPECTS ([place] Stroke Program Early CT Score) - Ganglionic level infarction (caudate, lentiform nuclei, internal capsule, insula, M1-M3 cortex): 7 - Supraganglionic infarction (M4-M6 cortex): 3 Total score (0-10 with 10 being normal): 10 CT CERVICAL SPINE FINDINGS Alignment: No static subluxation. Facets are aligned. Occipital condyles and the lateral masses of C1 and C2 are normally approximated. Skull base and vertebrae: No acute fracture. Soft tissues and spinal canal: No prevertebral fluid or swelling. No visible canal hematoma. Disc levels: No advanced spinal canal or neural foraminal stenosis. Upper chest: No pneumothorax, pulmonary nodule or pleural effusion. Other: Normal visualized paraspinal cervical soft tissues. IMPRESSION: 1. No acute intracranial abnormality. 2. ASPECTS is 10. 3. No acute fracture or static subluxation of the cervical spine. These results were communicated to Dr. Kerney Elbe at 12:24 am on 05/10/2022 by text page via the [facility] messaging system. Electronically Signed   By: Ulyses Jarred M.D.   On: 05/10/2022 00:47   CT C-SPINE NO CHARGE  Result Date: 05/10/2022 CLINICAL DATA:  Acute neurologic deficit EXAM: CT HEAD WITHOUT CONTRAST CT CERVICAL SPINE WITHOUT CONTRAST TECHNIQUE: Multidetector CT imaging of the head and cervical spine was performed following the standard protocol without intravenous contrast. Multiplanar CT image reconstructions of the cervical spine were also generated. RADIATION DOSE REDUCTION:  This  exam was performed according to the departmental dose-optimization program which includes automated exposure control, adjustment of the mA and/or kV according to patient size and/or use of iterative reconstruction technique. COMPARISON:  None Available. FINDINGS: CT HEAD FINDINGS Brain: There is no mass, hemorrhage or extra-axial collection. Generalized atrophy. Multiple old small vessel infarcts of the cerebellum. There is hypoattenuation of the periventricular white matter, most commonly indicating chronic ischemic microangiopathy. Old right frontal and parietal infarcts. Vascular: No abnormal hyperdensity of the major intracranial arteries or dural venous sinuses. No intracranial atherosclerosis. Skull: The visualized skull base, calvarium and extracranial soft tissues are normal. Sinuses/Orbits: No fluid levels or advanced mucosal thickening of the visualized paranasal sinuses. No mastoid or middle ear effusion. The orbits are normal. ASPECTS ([place] Stroke Program Early CT Score) - Ganglionic level infarction (caudate, lentiform nuclei, internal capsule, insula, M1-M3 cortex): 7 - Supraganglionic infarction (M4-M6 cortex): 3 Total score (0-10 with 10 being normal): 10 CT CERVICAL SPINE FINDINGS Alignment: No static subluxation. Facets are aligned. Occipital condyles and the lateral masses of C1 and C2 are normally approximated. Skull base and vertebrae: No acute fracture. Soft tissues and spinal canal: No prevertebral fluid or swelling. No visible canal hematoma. Disc levels: No advanced spinal canal or neural foraminal stenosis. Upper chest: No pneumothorax, pulmonary nodule or pleural effusion. Other: Normal visualized paraspinal cervical soft tissues. IMPRESSION: 1. No acute intracranial abnormality. 2. ASPECTS is 10. 3. No acute fracture or static subluxation of the cervical spine. These results were communicated to Dr. Kerney Elbe at 12:24 am on 05/10/2022 by text page via the [facility] messaging system.  Electronically Signed   By: Ulyses Jarred M.D.   On: 05/10/2022 00:47   CT ANGIO HEAD NECK W WO CM (CODE STROKE)  Result Date: 05/10/2022 CLINICAL DATA:  Acute neurologic deficit EXAM: CT ANGIOGRAPHY HEAD AND NECK TECHNIQUE: Multidetector CT imaging of the head and neck was performed using the standard protocol during bolus administration of intravenous contrast. Multiplanar CT image reconstructions and MIPs were obtained to evaluate the vascular anatomy. Carotid stenosis measurements (when applicable) are obtained utilizing NASCET criteria, using the distal internal carotid diameter as the denominator. RADIATION DOSE REDUCTION: This exam was performed according to the departmental dose-optimization program which includes automated exposure control, adjustment of the mA and/or kV according to patient size and/or use of iterative reconstruction technique. COMPARISON:  None Available. FINDINGS: CTA NECK FINDINGS SKELETON: There is no bony spinal canal stenosis. No lytic or blastic lesion. OTHER NECK: 12 mm right parotid nodule (series 7 image 81). UPPER CHEST: No pneumothorax or pleural effusion. No nodules or masses. AORTIC ARCH: There is calcific atherosclerosis of the aortic arch. There is no aneurysm, dissection or hemodynamically significant stenosis of the visualized portion of the aorta. Normal variant aortic arch branching pattern with the left vertebral artery arising independently from the aortic arch. The visualized proximal subclavian arteries are widely patent. RIGHT CAROTID SYSTEM: Atherosclerotic web at the bifurcation. Mixed density plaque with less than 50% stenosis. Widely patent ICA. LEFT CAROTID SYSTEM: Calcific atherosclerosis at the bifurcation without hemodynamically significant stenosis. VERTEBRAL ARTERIES: Right dominant configuration. Both origins are clearly patent. There is no dissection, occlusion or flow-limiting stenosis to the skull base (V1-V3 segments). CTA HEAD FINDINGS POSTERIOR  CIRCULATION: --Vertebral arteries: Right V4 segment atherosclerosis with widely maintained patency. --Inferior cerebellar arteries: Normal. --Basilar artery: Normal. --Superior cerebellar arteries: Normal. --Posterior cerebral arteries (PCA): Normal. ANTERIOR CIRCULATION: --Intracranial internal carotid arteries: Atherosclerotic calcification of the internal carotid arteries  at the skull base without hemodynamically significant stenosis. --Anterior cerebral arteries (ACA): Normal. Both A1 segments are present. Patent anterior communicating artery (a-comm). --Middle cerebral arteries (MCA): There is occlusion of the proximal M2 segment inferior division of the right MCA (series 10, image 114). Left MCA is normal. VENOUS SINUSES: As permitted by contrast timing, patent. ANATOMIC VARIANTS: Fetal origin of the right posterior cerebral artery. Review of the MIP images confirms the above findings. IMPRESSION: 1. Occlusion of the proximal M2 segment inferior division of the right MCA. 2. 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 3. Nonspecific 12 mm right parotid nodule. 4. Critical Value/emergent results were called by telephone at the time of interpretation on 05/10/2022 at 12:38 am to provider ERIC [facility] , who verbally acknowledged these results. By Electronically Signed   By: Ulyses Jarred M.D.   On: 05/10/2022 00:39     PHYSICAL EXAM  Temp:  [98.5 F (36.9 C)-99 F (37.2 C)] 98.7 F (37.1 C) (02/25 0825) Pulse Rate:  [68-77] 75 (02/25 0825) Resp:  [17-18] 18 (02/25 0357) BP: (113-163)/(66-81) 163/74 (02/25 0825) SpO2:  [94 %-96 %] 94 % (02/25 0825)  General - Well nourished, well developed, appears to be shifting his bed often due to discomfort.  Ophthalmologic - fundi not visualized due to noncooperation.  Cardiovascular - irregularly irregular heart rate and rhythm.  Neuro - awake, alert, eyes open, orientated to name, place, time. No aphasia but moderate  dysarthria, following all simple commands.  Able to name and repeat with dysarthric voice.  Right gaze preference and barely cross midline but able to do so, tracking on the right visual field. Left visual field cut made worse due to neglect.  Left facial droop. Tongue protrusion midline.  Motor: LUE deltoid 3-/5, bicep and tricep 3+/5, finger grip 0/5.  Left LE proximal 3/5, distal 4/5.  RUE 5/5 and RLE 5/5.   Left sensory decreased with neglect.   Gait not tested.    ASSESSMENT/PLAN Mr. DENZIL PALKO is a 89 y.o. male with history of A-fib on Xarelto, hyperlipidemia, stroke in 05/2013 admitted for left-sided weakness and he fell at home. No tPA given due to outside window.    Stroke:  right large MCA infarct due to right M2 occlusion status post IR with  [id number], embolic secondary to A-fib given on Xarelto  CT no acute abnormality CTA head and neck right M2 proximal occlusion, right ICA bulb atherosclerosis IR with right M2 distal occlusion. S/p 4 passes, TICI2c  MRI large right MCA infarct MRA persistent right MCA occlusion although motion degraded 2D Echo EF 60 to 65% LDL 56 HgbA1c 5.8 Heparin subcu for VTE prophylaxis Xarelto (rivaroxaban) daily prior to admission, now on ASA 325 given large size of infarct. Will switch to eliquis on 2/27 if neuro stable.  Ongoing aggressive stroke risk factor management Therapy recommendations: CIR Disposition: Pending  History of stroke 05/2013 admitted for left-sided weakness and imbalance.  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  MRA/TTE/carotid Doppler negative.  Discharged on aspirin and Zocor. Outpatient 30-day CardioNet monitoring negative and loop recorder placed.  Found to have A-fib in 12/2014, started on Xarelto.  Mild left sided residual at baseline.  Chronic A-fib On Xarelto at home Currently [facility] on hold given large size of infarct Now on ASA 325 will switch to Eliquis on 2/27 if neuro stable.    Hypertension Home meds: toprol 12.5 Stable on the high end BP goal <  180/105 Off cleviprex on amlodipine and home toprol 25 Long term BP goal normotensive  Hyperlipidemia Home meds: Zocor 40 LDL 56, goal < 70 Now on lipitor 20 (zocor 40 equivalent, as amlodipine can increase the risk of rhabdo when given with Zocor )  No high intensity statin given LDL at goal Continue statin at discharge  Dysphagia  Pass swallow On dys 1  puree and nectar thick liquid D/C IVF Encourage po intake  Other Stroke Risk Factors Advanced age Obesity, Body mass index is 30.68 kg/m.   Other Active Problems CKD 3A, creatinine 1.30-1.43-1.13-1.03-1.26. start IVFs 25cc/hr. Mild leukocytosis, WBC 11.6-8.7-7.4 Insomnia - stop Ambien due to confusion. Start trazadone prn.  Hospital day # 4  Doing well from yesterday.  Exam is essentially unchanged slight bump in his creatinine will add normal saline at 25 cc an hour and continue to monitor.  Encourage p.o. intake.  Plan to start anticoagulation on Tuesday if exam is stable.  MDM: Moderate. Pertinent labs, imaging results reviewed by me and considered in my decision making. Independently reviewed imaging. Medical records reviewed. Discussed the patient with another medical provider/personnel. Obtained history from someone other than the patient.   Enid Maultsby,MD   05/14/2022 2:46 PM    To contact Stroke Continuity provider, please refer to [email or web address] After hours, contact General Neurology

## 2022-05-15 DIAGNOSIS — I63311 Cerebral infarction due to thrombosis of right middle cerebral artery: Secondary | ICD-10-CM

## 2022-05-15 MED ORDER — SODIUM CHLORIDE 0.9 % IV SOLN: INTRAVENOUS | Status: DC

## 2022-05-15 NOTE — Progress Notes (Signed)
Inpatient Rehab Admissions Coordinator:   Stopped by pts room.  Wife at bedside (she states she's his mother).  Left voicemail for daughter to discuss rehab venues and caregiver supports.  Will follow.   Shann Medal, PT, DPT Admissions Coordinator [phone] 05/15/22  4:35 PM

## 2022-05-15 NOTE — Progress Notes (Addendum)
STROKE TEAM PROGRESS NOTE   SUBJECTIVE (INTERVAL HISTORY)  Daughter, Edward Maynard, at bedside.  No acute events overnight, transferred out of ICU 2/25.  Neuro exam stable with left hemiparesis, L field cut and L facial droop.  Planning for discharge to AIR.  Vital signs stable.  OBJECTIVE Temp:  [97.5 F (36.4 C)-98.6 F (37 C)] 97.6 F (36.4 C) (02/26 1100) Pulse Rate:  [63-80] 76 (02/26 1100) Cardiac Rhythm: Normal sinus rhythm (02/26 0738) Resp:  [18-20] 20 (02/26 1100) BP: (115-141)/(64-74) 115/66 (02/26 1100) SpO2:  [95 %-98 %] 95 % (02/26 1100)  Recent Labs  Lab 05/10/22 0003  GLUCAP 135*    Recent Labs  Lab 05/10/22 0005 05/10/22 0010 05/10/22 0545 05/11/22 1246 05/12/22 0631 05/14/22 0533  NA 137 139 135 135 137 135  K 3.5 3.7 3.8 3.9 3.8 4.0  CL 103 103 103 103 102 101  CO2 21*  --  '22 27 23 25  '$ GLUCOSE 158* 157* 183* 123* 122* 111*  BUN 20 25* '19 19 19 '$ 26*  CREATININE 1.33* 1.30* 1.43* 1.13 1.03 1.26*  CALCIUM 9.2  --  8.6* 8.5* 8.7* 9.0  PHOS  --   --   --  2.4*  --   --     Recent Labs  Lab 05/10/22 0005 05/11/22 1246  AST 27  --   ALT 20  --   ALKPHOS 93  --   BILITOT 0.4  --   PROT 7.0  --   ALBUMIN 4.1 3.5    Recent Labs  Lab 05/10/22 0005 05/10/22 0010 05/10/22 0545 05/11/22 1246 05/12/22 0631 05/14/22 0533  WBC 9.2  --  11.6* 8.7 7.4 7.0  NEUTROABS 6.6  --  9.2*  --   --   --   HGB 16.7 16.7 16.0 13.7 14.5 15.7  HCT 49.6 49.0 46.7 39.4 43.0 44.1  MCV 98.4  --  97.3 97.3 98.2 95.7  PLT 141*  --  164 144* 146* 163    No results for input(s): "CKTOTAL", "CKMB", "CKMBINDEX", "TROPONINI" in the last 168 hours. No results for input(s): "LABPROT", "INR" in the last 72 hours.  No results for input(s): "COLORURINE", "LABSPEC", "PHURINE", "GLUCOSEU", "HGBUR", "BILIRUBINUR", "KETONESUR", "PROTEINUR", "UROBILINOGEN", "NITRITE", "LEUKOCYTESUR" in the last 72 hours.  Invalid input(s): "APPERANCEUR"     Component Value Date/Time   CHOL 134  05/10/2022 0545   TRIG 190 (H) 05/10/2022 0545   HDL 40 (L) 05/10/2022 0545   CHOLHDL 3.4 05/10/2022 0545   VLDL 38 05/10/2022 0545   LDLCALC 56 05/10/2022 0545   Lab Results  Component Value Date   HGBA1C 5.8 (H) 05/10/2022   No results found for: "LABOPIA", "COCAINSCRNUR", "LABBENZ", "AMPHETMU", "THCU", "LABBARB"  Recent Labs  Lab 05/10/22 0005  ETH <10     I have personally reviewed the radiological images below and agree with the radiology interpretations.  DG Swallowing Func-Speech Pathology  Result Date: 05/11/2022 Table formatting from the original result was not included. Modified Barium Swallow Study Patient Details Name: Edward Maynard MRN: [id number] Date of Birth: [birth year] Today's Date: 05/11/2022 HPI/PMH: HPI: 89 year old male with atrial fibrillation (on Xarelto), prior stroke, presenting with acute onset of left hemiplegia and left hemineglect after sustaining a fall to the left while at home this evening. Underwent thrombectomy Clinical Impression: Clinical Impression: Pt demonstrates moderate to severe oral dysphagia witn left CN VII weakness and sensory impairment leading to severe anterior spillage with cup sips of liquids. Lingual, buccal and palatal residue  present with most trials. Thin residue spills to pharynx post swallow. There is delayed swallow initaition with all liquids. No aspiration of thin occurred until pt attempted consecutive straw sips (PAS 7); however, there is higher risk with thin and severe anterior spillage. Nectar was better controlled orally and with a straw, which reduced oral residue and spillage. Pt unable to masticate solids without dentures and lingual manipulation of bolus very poor. Recommend purees and nectar thick liquids while pt learns strategies and improves awareness. Can quickly upgrade to thin as pt demonstrates awareness and control without severe coughing Factors that may increase risk of adverse event in presence of aspiration  ([place] 2021): Factors that may increase risk of adverse event in presence of aspiration ([place] 2021): Limited mobility; Reduced cognitive function Recommendations/Plan: Swallowing Evaluation Recommendations Swallowing Evaluation Recommendations Liquid Administration via: Straw; Cup Medication Administration: Whole meds with puree Supervision: Full supervision/cueing for swallowing strategies Swallowing strategies  : Slow rate; Small bites/sips; Minimize environmental distractions; Check for anterior loss Postural changes: Stay upright 30-60 min after meals Treatment Plan Treatment Plan Treatment recommendations: Therapy as outlined in treatment plan below Follow-up recommendations: Acute inpatient rehab (3 hours/day) Functional status assessment: Patient has had a recent decline in their functional status and demonstrates the ability to make significant improvements in function in a reasonable and predictable amount of time. Treatment frequency: Min 2x/week Treatment duration: 2 weeks Interventions: Aspiration precaution training; Diet toleration management by SLP; Trials of upgraded texture/liquids; Patient/family education; Compensatory techniques Recommendations Recommendations for follow up therapy are one component of a multi-disciplinary discharge planning process, led by the attending physician.  Recommendations may be updated based on patient status, additional functional criteria and insurance authorization. Assessment: Orofacial Exam: Orofacial Exam Oral Cavity - Dentition: Dentures, top; Dentures, bottom Anatomy: Anatomy: WFL Thin Liquids: Thin Liquids (Level 0) Thin Liquids : Impaired Bolus delivery method: Cup Thin Liquid - Impairment: Oral Impairment; Pharyngeal impairment Lip Closure: Escape beyond mid-chin Tongue control during bolus hold: Escape to lateral buccal cavity/floor of mouth Bolus transport/lingual motion: Repetitive/disorganized tongue motion Oral residue: Residue  collection on oral structures Location of oral residue : Floor of mouth; Lateral sulci; Tongue Initiation of swallow : Pyriform sinuses Soft palate elevation: No bolus between soft palate (SP)/pharyngeal wall (PW) Laryngeal elevation: Complete superior movement of thyroid cartilage with complete approximation of arytenoids to epiglottic petiole Anterior hyoid excursion: Complete Epiglottic movement: Complete Laryngeal vestibule closure: Complete, no air/contrast in laryngeal vestibule Pharyngeal stripping wave : Present - complete Pharyngeal contraction (A/P view only): N/A Pharyngoesophageal segment opening: Complete distension and complete duration, no obstruction of flow Tongue base retraction: Trace column of contrast or air between tongue base and PPW Pharyngeal residue: Trace residue within or on pharyngeal structures Location of pharyngeal residue: Valleculae Penetration/Aspiration Scale (PAS) score: 7.  Material enters airway, passes BELOW cords and not ejected out despite cough attempt by patient; 1.  Material does not enter airway (aspiration with straw only)  Mildly Thick Liquids: Mildly thick liquids (Level 2, nectar thick) Mildly thick liquids (Level 2, nectar thick): Impaired Bolus delivery method: Cup Mildly Thick Liquid - Impairment: Oral Impairment; Pharyngeal impairment Lip Closure: Escape beyond mid-chin Tongue control during bolus hold: Escape to lateral buccal cavity/floor of mouth Bolus transport/lingual motion: Repetitive/disorganized tongue motion Oral residue: Residue collection on oral structures Location of oral residue : Lateral sulci; Floor of mouth Initiation of swallow : Pyriform sinuses Soft palate elevation: No bolus between soft palate (SP)/pharyngeal wall (PW) Laryngeal elevation:  Complete superior movement of thyroid cartilage with complete approximation of arytenoids to epiglottic petiole Anterior hyoid excursion: Complete Epiglottic movement: Complete Laryngeal vestibule  closure: Complete, no air/contrast in laryngeal vestibule Pharyngeal stripping wave : Present - complete Pharyngeal contraction (A/P view only): Complete; N/A Pharyngoesophageal segment opening: Complete distension and complete duration, no obstruction of flow Tongue base retraction: Trace column of contrast or air between tongue base and PPW Pharyngeal residue: Trace residue within or on pharyngeal structures Location of pharyngeal residue: Valleculae  Moderately Thick Liquids: Moderately thick liquids (Level 3, honey thick) Moderately thick liquids (Level 3, honey thick): Impaired Bolus delivery method: Spoon Moderately Thick Liquid - Impairment: Oral Impairment Lip Closure: Escape from interlabial space or lateral juncture, no extension beyond vermillion border Tongue control during bolus hold: Escape to lateral buccal cavity/floor of mouth Bolus transport/lingual motion: Repetitive/disorganized tongue motion Oral residue: Residue collection on oral structures Location of oral residue : Floor of mouth; Lateral sulci Initiation of swallow : Posterior laryngeal surface of the epiglottis  Puree: Puree Puree: Impaired Puree - Impairment: Oral Impairment Lip Closure: Interlabial escape, no progression to anterior lip Bolus transport/lingual motion: Repetitive/disorganized tongue motion Oral residue: Trace residue lining oral structures Location of oral residue : Tongue Initiation of swallow: Valleculae Solid: Solid Solid: Impaired Solid - Impairment: Oral Impairment Lip Closure: Escape beyond mid-chin Bolus preparation/mastication: Minimal chewing/mashing with majority of bolus unchewed Bolus transport/lingual motion: Repetitive/disorganized tongue motion Oral residue: Majority of bolus remaining Location of oral residue : Lateral sulci Pill: Pill Pill: Not Tested Compensatory Strategies: Compensatory Strategies Compensatory strategies: Yes Straw: Effective Effective Straw: Mildly thick liquid (Level 2, nectar thick)  (aspiration with thin)   General Information: Caregiver present: No  Diet Prior to this Study: NPO   No data recorded  No data recorded  Supplemental O2: None (Room air)   History of Recent Intubation: No  Behavior/Cognition: Alert; Cooperative; Pleasant mood No data recorded Baseline vocal quality/speech: Normal Volitional Cough: Able to elicit Volitional Swallow: Able to elicit No data recorded Goal Planning: Prognosis for improved oropharyngeal function: Good Barriers to Reach Goals: Cognitive deficits No data recorded No data recorded Consulted and agree with results and recommendations: Patient; Family member/caregiver Pain: Pain Assessment Pain Assessment: Faces Faces Pain Scale: 0 Pain Intervention(s): Monitored during session End of Session: Start Time:SLP Start Time (ACUTE ONLY): 1345 Stop Time: SLP Stop Time (ACUTE ONLY): 1405 Time Calculation:SLP Time Calculation (min) (ACUTE ONLY): 20 min Charges: SLP Evaluations $ SLP Speech Visit: 1 Visit SLP Evaluations $BSS Swallow: 1 Procedure $MBS Swallow: 1 Procedure $ SLP EVAL LANGUAGE/SOUND PRODUCTION: 1 Procedure $Swallowing Treatment: 1 Procedure $Speech Treatment for Individual: 1 Procedure SLP visit diagnosis: SLP Visit Diagnosis: Dysphagia, oropharyngeal phase (R13.12) Past Medical History: Past Medical History: Diagnosis Date  High cholesterol   05/2013; no defecits  Hyperlipemia   Left arm weakness   Left hand weakness   Personal history of colonic polyp - adenoma  10/23/2013  Stroke ([place]) 05/2013  MRI on 07/02/13 = Multifocal acute & subacute infarction involving right frontal MCA/ACA & left parietal MCA/PCA watershed areas Past Surgical History: Past Surgical History: Procedure Laterality Date  IR CT HEAD LTD  05/10/2022  IR PERCUTANEOUS ART THROMBECTOMY/INFUSION INTRACRANIAL INC DIAG ANGIO  05/10/2022  LOOP RECORDER IMPLANT N/A 10/31/2013  Procedure: LOOP RECORDER IMPLANT;  Surgeon: Coralyn Mark, MD;  Location: [place] [facility];  Service: Cardiovascular;   Laterality: N/A;  RADIOLOGY WITH ANESTHESIA N/A 05/10/2022  Procedure: IR WITH ANESTHESIA;  Surgeon: Luanne Bras, MD;  Location: [place];  Service: Radiology;  Laterality: N/A;  SHOULDER SURGERY Bilateral 2003, 1995 [place], Katherene Ponto 05/11/2022, 2:20 PM  IR PERCUTANEOUS ART THROMBECTOMY/INFUSION INTRACRANIAL INC DIAG ANGIO  Result Date: 05/11/2022 INDICATION: Worsening left-sided weakness and right gaze deviation. Occluded distal M2 segment of the inferior division of the right middle cerebral artery on CT angiogram of the head and neck. EXAM: 1. EMERGENT LARGE VESSEL OCCLUSION THROMBOLYSIS anterior CIRCULATION) COMPARISON:  CT angiogram of the head and neck of May 10, 2022. MEDICATIONS: Ancef 2 g IV antibiotic was administered within 1 hour of the procedure. ANESTHESIA/SEDATION: General anesthesia. CONTRAST:  Omnipaque 300 approximately 70 mL. FLUOROSCOPY TIME:  Fluoroscopy Time: 41 minutes 16 seconds (2313 mGy). COMPLICATIONS: None immediate. TECHNIQUE: Following a full explanation of the procedure along with the potential associated complications, an informed witnessed consent was obtained. The risks of intracranial hemorrhage of 10%, worsening neurological deficit, ventilator dependency, death and inability to revascularize were all reviewed in detail with the patient's spouse. The patient was then put under general anesthesia by the Department of Anesthesiology at [facility]. The right groin was prepped and draped in the usual sterile fashion. Thereafter using modified Seldinger technique, transfemoral access into the right common femoral artery was obtained without difficulty. Over a 0.035 inch guidewire an 8 French 25 cm Pinnacle sheath was inserted. Through this a combination of a 125 cm 5 [place] Berenstein support catheter inside of an 087 95 cm balloon guide catheter was advanced initially proximal to the right common carotid bifurcation and then into the distal right internal  carotid artery. FINDINGS: The right common carotid bifurcation demonstrates the right external carotid artery and its major branches to be widely patent. The right internal carotid artery at the bulb demonstrates a smooth shallow plaque along the posterior wall associated with 10% stenosis by the NASCET criteria. Distal to this the right internal carotid artery demonstrates opacification to the cranial skull base. The petrous, the cavernous and the supraclinoid right ICA demonstrate wide patency. A right posterior communicating artery is seen opacifying the right posterior cerebral artery distribution. The right anterior cerebral artery opacifies into the capillary and venous phases. The right middle cerebral artery M1 segment is widely patent. There is an occlusion of a parietal branch of the inferior division of the right middle cerebral artery in the distal M3 region. Delayed arterial and capillary phase demonstrates significant retrograde opacification of the distal right parietal cortical and subcortical distribution from the anterior cerebral and the P3 leptomeningeal branches of the right posterior cerebral artery. PROCEDURE: Through the balloon guide catheter in the distal right internal carotid artery, a combination of an 045 Zoom aspiration catheter with a 160 cm Trevo microcatheter was advanced over an 014 inch soft tip micro guidewire with a moderate J configuration to the supraclinoid right ICA. The micro guidewire was then gently manipulated with a torque device and advanced without difficulty distal to the occluded branch of the inferior division into the M3 region followed by the microcatheter. A 3 mm x 20 mm Solitaire X retrieval device was then deployed following verification of safe positioning of the tip of the microcatheter. This was then followed by the advancement of the Zoom aspiration catheter which was engaged just inside the occluded portion of the vessel, the micro guidewire and the  microcatheter were retrieved proximally. Following 2 minutes of contact aspiration with a Penumbra aspiration device and proximal flow arrest, the Zoom aspiration catheter was removed. Two approximately 1 mm pearly sticky clots  were retrieved. A control arteriogram demonstrated no significant change in the occluded parietal branch. A second pass was then made using the above combination. The microcatheter was advanced past the occlusion into the M3 region with the micro guidewire. The guidewire was removed. Good aspiration obtained from the hub of the microcatheter which was then connected to continuous heparinized saline infusion. The Zoom aspiration catheter was then engaged just inside the occluded segment of the vessel. A 3 mm x 20 mm Solitaire X retrieval device was then deployed in the usual manner. Thereafter, constant aspiration was applied with proximal flow arrest the hub of the Zoom aspiration catheter for a minute and a half. Following this the combination of the retrieval device, the microcatheter and the aspiration catheter were removed. A control arteriogram performed through the balloon guide catheter following reversal of flow arrest now demonstrated improved flow through the occluded branch though slow to the M3 M4 regions. A third pass was then made using a combination using an 035 inch aspiration catheter advanced over an 014 inch soft tip micro guidewire with a moderate J configuration to the right middle cerebral artery. Access into the now reoccluded inferior division M2 branch was then made with the micro guidewire followed by the microcatheter into the distal M3 region. The guidewire was removed. The 035 inch Zoom aspiration was then engaged into the clot with no aspiration obtained. Aspiration was then applied with a Penumbra aspiration device for approximately 2 minutes with proximal flow arrest. The aspiration catheter was removed. A control arteriogram performed through the balloon guide  catheter demonstrated mild improvement in revascularization of the occluded branch with the clot noted slightly more distally. A fourth pass was then made this time again using an 045 Zoom aspiration catheter advanced with an 021 microcatheter and over 014 inch micro guidewire. This combination was then advanced into the M3 region of the occluded branch followed by the microcatheter. The guidewire was removed. Good aspiration obtained from the hub of the microcatheter which was positioned distally in the M3 segment. This was then connected to continuous heparinized saline infusion. A 4 mm x 40 mm Solitaire X retrieval stent was then deployed in the usual manner with the 045 Zoom aspiration catheter imbedded in the occluded branch. Aspiration was then for approximately 2 minutes at the hub of the aspiration catheter with proximal flow arrest. Thereafter, the retriever, the microcatheter and the Zoom aspiration catheter were removed. A control arteriogram performed following reversal of flow arrest in the right internal carotid artery, continued to demonstrate slow antegrade flow distal to the occluded segment. A control arteriogram performed through the balloon guide catheter continued to demonstrate a TICI 2C over revascularization with only mild distal flow past the occluded M2 branch of the right inferior division MCA. No further passes were made due to now steadily increased risk of vessel injury perforation. The balloon guide was retrieved in the right common carotid artery. A control arteriogram performed through this continued to demonstrate patency of right internal carotid artery proximally and distally including that of the right posterior communicating artery, the right anterior cerebral artery and the right middle cerebral artery distributions. An 8 French Angio-Seal closure device was then deployed in the right groin puncture site for hemostasis. Distal pulses remained Dopplerable in both feet unchanged  from prior to the procedure. A flat panel CT of the brain demonstrated no evidence of intra cerebral hemorrhage. The patient's general anesthesia was reversed, and patient was extubated. Initially agitated, the patient  gradually settled down to where simple commands appropriately and verbalized appropriately. The patient moved his left lower extremities spontaneously and to command with good strength. Slight movement was evident in the left upper extremity. The patient was then transferred to the neuro ICU for post revascularization management. IMPRESSION: Status post endovascular revascularization of occluded distal M2 branch of the right middle cerebral artery inferior division with 2 passes with a 3 mm x 20 mm retrieval device and contact aspiration, 1 pass with a 4 mm x 40 mm Solitaire X retrieval device and contact aspiration, 1 pass with contact aspiration with minimal improvement in revascularization of the occluded right MCA inferior division M2 segment occlusion. A TICI 2C revascularization was maintained. PLAN: Follow-up as per referring MD. Electronically Signed   By: Luanne Bras M.D.   On: 05/11/2022 08:28   IR CT Head Ltd  Result Date: 05/11/2022 INDICATION: Worsening left-sided weakness and right gaze deviation. Occluded distal M2 segment of the inferior division of the right middle cerebral artery on CT angiogram of the head and neck. EXAM: 1. EMERGENT LARGE VESSEL OCCLUSION THROMBOLYSIS anterior CIRCULATION) COMPARISON:  CT angiogram of the head and neck of May 10, 2022. MEDICATIONS: Ancef 2 g IV antibiotic was administered within 1 hour of the procedure. ANESTHESIA/SEDATION: General anesthesia. CONTRAST:  Omnipaque 300 approximately 70 mL. FLUOROSCOPY TIME:  Fluoroscopy Time: 41 minutes 16 seconds (2313 mGy). COMPLICATIONS: None immediate. TECHNIQUE: Following a full explanation of the procedure along with the potential associated complications, an informed witnessed consent was  obtained. The risks of intracranial hemorrhage of 10%, worsening neurological deficit, ventilator dependency, death and inability to revascularize were all reviewed in detail with the patient's spouse. The patient was then put under general anesthesia by the Department of Anesthesiology at [facility]. The right groin was prepped and draped in the usual sterile fashion. Thereafter using modified Seldinger technique, transfemoral access into the right common femoral artery was obtained without difficulty. Over a 0.035 inch guidewire an 8 French 25 cm Pinnacle sheath was inserted. Through this a combination of a 125 cm 5 [place] Berenstein support catheter inside of an 087 95 cm balloon guide catheter was advanced initially proximal to the right common carotid bifurcation and then into the distal right internal carotid artery. FINDINGS: The right common carotid bifurcation demonstrates the right external carotid artery and its major branches to be widely patent. The right internal carotid artery at the bulb demonstrates a smooth shallow plaque along the posterior wall associated with 10% stenosis by the NASCET criteria. Distal to this the right internal carotid artery demonstrates opacification to the cranial skull base. The petrous, the cavernous and the supraclinoid right ICA demonstrate wide patency. A right posterior communicating artery is seen opacifying the right posterior cerebral artery distribution. The right anterior cerebral artery opacifies into the capillary and venous phases. The right middle cerebral artery M1 segment is widely patent. There is an occlusion of a parietal branch of the inferior division of the right middle cerebral artery in the distal M3 region. Delayed arterial and capillary phase demonstrates significant retrograde opacification of the distal right parietal cortical and subcortical distribution from the anterior cerebral and the P3 leptomeningeal branches of the right  posterior cerebral artery. PROCEDURE: Through the balloon guide catheter in the distal right internal carotid artery, a combination of an 045 Zoom aspiration catheter with a 160 cm Trevo microcatheter was advanced over an 014 inch soft tip micro guidewire with a moderate J configuration to the supraclinoid  right ICA. The micro guidewire was then gently manipulated with a torque device and advanced without difficulty distal to the occluded branch of the inferior division into the M3 region followed by the microcatheter. A 3 mm x 20 mm Solitaire X retrieval device was then deployed following verification of safe positioning of the tip of the microcatheter. This was then followed by the advancement of the Zoom aspiration catheter which was engaged just inside the occluded portion of the vessel, the micro guidewire and the microcatheter were retrieved proximally. Following 2 minutes of contact aspiration with a Penumbra aspiration device and proximal flow arrest, the Zoom aspiration catheter was removed. Two approximately 1 mm pearly sticky clots were retrieved. A control arteriogram demonstrated no significant change in the occluded parietal branch. A second pass was then made using the above combination. The microcatheter was advanced past the occlusion into the M3 region with the micro guidewire. The guidewire was removed. Good aspiration obtained from the hub of the microcatheter which was then connected to continuous heparinized saline infusion. The Zoom aspiration catheter was then engaged just inside the occluded segment of the vessel. A 3 mm x 20 mm Solitaire X retrieval device was then deployed in the usual manner. Thereafter, constant aspiration was applied with proximal flow arrest the hub of the Zoom aspiration catheter for a minute and a half. Following this the combination of the retrieval device, the microcatheter and the aspiration catheter were removed. A control arteriogram performed through the  balloon guide catheter following reversal of flow arrest now demonstrated improved flow through the occluded branch though slow to the M3 M4 regions. A third pass was then made using a combination using an 035 inch aspiration catheter advanced over an 014 inch soft tip micro guidewire with a moderate J configuration to the right middle cerebral artery. Access into the now reoccluded inferior division M2 branch was then made with the micro guidewire followed by the microcatheter into the distal M3 region. The guidewire was removed. The 035 inch Zoom aspiration was then engaged into the clot with no aspiration obtained. Aspiration was then applied with a Penumbra aspiration device for approximately 2 minutes with proximal flow arrest. The aspiration catheter was removed. A control arteriogram performed through the balloon guide catheter demonstrated mild improvement in revascularization of the occluded branch with the clot noted slightly more distally. A fourth pass was then made this time again using an 045 Zoom aspiration catheter advanced with an 021 microcatheter and over 014 inch micro guidewire. This combination was then advanced into the M3 region of the occluded branch followed by the microcatheter. The guidewire was removed. Good aspiration obtained from the hub of the microcatheter which was positioned distally in the M3 segment. This was then connected to continuous heparinized saline infusion. A 4 mm x 40 mm Solitaire X retrieval stent was then deployed in the usual manner with the 045 Zoom aspiration catheter imbedded in the occluded branch. Aspiration was then for approximately 2 minutes at the hub of the aspiration catheter with proximal flow arrest. Thereafter, the retriever, the microcatheter and the Zoom aspiration catheter were removed. A control arteriogram performed following reversal of flow arrest in the right internal carotid artery, continued to demonstrate slow antegrade flow distal to the  occluded segment. A control arteriogram performed through the balloon guide catheter continued to demonstrate a TICI 2C over revascularization with only mild distal flow past the occluded M2 branch of the right inferior division MCA. No further passes were  made due to now steadily increased risk of vessel injury perforation. The balloon guide was retrieved in the right common carotid artery. A control arteriogram performed through this continued to demonstrate patency of right internal carotid artery proximally and distally including that of the right posterior communicating artery, the right anterior cerebral artery and the right middle cerebral artery distributions. An 8 French Angio-Seal closure device was then deployed in the right groin puncture site for hemostasis. Distal pulses remained Dopplerable in both feet unchanged from prior to the procedure. A flat panel CT of the brain demonstrated no evidence of intra cerebral hemorrhage. The patient's general anesthesia was reversed, and patient was extubated. Initially agitated, the patient gradually settled down to where simple commands appropriately and verbalized appropriately. The patient moved his left lower extremities spontaneously and to command with good strength. Slight movement was evident in the left upper extremity. The patient was then transferred to the neuro ICU for post revascularization management. IMPRESSION: Status post endovascular revascularization of occluded distal M2 branch of the right middle cerebral artery inferior division with 2 passes with a 3 mm x 20 mm retrieval device and contact aspiration, 1 pass with a 4 mm x 40 mm Solitaire X retrieval device and contact aspiration, 1 pass with contact aspiration with minimal improvement in revascularization of the occluded right MCA inferior division M2 segment occlusion. A TICI 2C revascularization was maintained. PLAN: Follow-up as per referring MD. Electronically Signed   By: Luanne Bras M.D.   On: 05/11/2022 08:28   MR BRAIN WO CONTRAST  Result Date: 05/10/2022 CLINICAL DATA:  Stroke, follow up EXAM: MRI HEAD WITHOUT CONTRAST MRA HEAD WITHOUT CONTRAST TECHNIQUE: Multiplanar, multi-echo pulse sequences of the brain and surrounding structures were acquired without intravenous contrast. Angiographic images of the Circle of Willis were acquired using MRA technique without intravenous contrast. COMPARISON:  CTA and CT head from same day. FINDINGS: MRI HEAD FINDINGS Motion limited study. Brain: Acute posterior right MCA territory infarct. Associated edema and regional mass effect without significant midline shift. No mass occupying acute hemorrhage. No mass lesion. No hydrocephalus. Remote infarct in the right cerebellum. Vascular: Detailed below. Skull and upper cervical spine: Normal marrow signal. Sinuses/Orbits: Clear sinuses.  No acute orbital findings. MRA HEAD FINDINGS Severely motion limited study. Anterior circulation: Bilateral intracranial ICAs, proximal ACAs and proximal M1 MCAs appear grossly patent. Essentially nondiagnostic evaluation of the M2 MCAs, including the region of occlusion seen on recent CTA. Posterior circulation: The intradural vertebral arteries, basilar arteries and proximal PCAs are grossly patent. IMPRESSION: 1. Acute right posterior MCA territory infarct. Associated edema without midline shift. 2. Severely motion limited MRA with nondiagnostic evaluation of the right M2 MCA in the region of recently seen occlusion on CTA. A repeat exclude exam may be able to better assess if the patient is able in clinically warranted. Electronically Signed   By: Margaretha Sheffield M.D.   On: 05/10/2022 16:03   MR ANGIO HEAD WO CONTRAST  Result Date: 05/10/2022 CLINICAL DATA:  Stroke, follow up EXAM: MRI HEAD WITHOUT CONTRAST MRA HEAD WITHOUT CONTRAST TECHNIQUE: Multiplanar, multi-echo pulse sequences of the brain and surrounding structures were acquired without  intravenous contrast. Angiographic images of the Circle of Willis were acquired using MRA technique without intravenous contrast. COMPARISON:  CTA and CT head from same day. FINDINGS: MRI HEAD FINDINGS Motion limited study. Brain: Acute posterior right MCA territory infarct. Associated edema and regional mass effect without significant midline shift. No mass occupying acute hemorrhage. No  mass lesion. No hydrocephalus. Remote infarct in the right cerebellum. Vascular: Detailed below. Skull and upper cervical spine: Normal marrow signal. Sinuses/Orbits: Clear sinuses.  No acute orbital findings. MRA HEAD FINDINGS Severely motion limited study. Anterior circulation: Bilateral intracranial ICAs, proximal ACAs and proximal M1 MCAs appear grossly patent. Essentially nondiagnostic evaluation of the M2 MCAs, including the region of occlusion seen on recent CTA. Posterior circulation: The intradural vertebral arteries, basilar arteries and proximal PCAs are grossly patent. IMPRESSION: 1. Acute right posterior MCA territory infarct. Associated edema without midline shift. 2. Severely motion limited MRA with nondiagnostic evaluation of the right M2 MCA in the region of recently seen occlusion on CTA. A repeat exclude exam may be able to better assess if the patient is able in clinically warranted. Electronically Signed   By: Margaretha Sheffield M.D.   On: 05/10/2022 16:03   ECHOCARDIOGRAM COMPLETE  Result Date: 05/10/2022    ECHOCARDIOGRAM REPORT   Patient Name:   Edward Maynard Date of Exam: 05/10/2022 Medical Rec #:  [id number]     Height:       71.5 in Accession #:    [id number]    Weight:       223.1 lb Date of Birth:  26-Jun-1933    BSA:          2.220 m Patient Age:    89 years      BP:           160/90 mmHg Patient Gender: M             HR:           73 bpm. Exam Location:  Inpatient Procedure: 2D Echo, Cardiac Doppler and Color Doppler Indications:    Stroke I63.9  History:        Patient has prior history of  Echocardiogram examinations and                 Patient has no prior history of Echocardiogram examinations.                 Pacemaker, Stroke, Arrythmias:PVC and Tachycardia; Risk                 Factors:Dyslipidemia.  Sonographer:    Ronny Flurry Referring Phys: [phone] ERIC LINDZEN  Sonographer Comments: Image acquisition challenging due to respiratory motion. IMPRESSIONS  1. Left ventricular ejection fraction, by estimation, is 60 to 65%. The left ventricle has normal function. The left ventricle has no regional wall motion abnormalities. Left ventricular diastolic parameters are consistent with Grade II diastolic dysfunction (pseudonormalization).  2. Right ventricular systolic function is normal. The right ventricular size is normal. There is normal pulmonary artery systolic pressure.  3. The mitral valve is normal in structure. No evidence of mitral valve regurgitation. No evidence of mitral stenosis.  4. The aortic valve is calcified. Aortic valve regurgitation is trivial. Mild aortic valve stenosis. Aortic valve area, by VTI measures 1.55 cm. Aortic valve mean gradient measures 19.0 mmHg. Aortic valve Vmax measures 2.94 m/s.  5. The inferior vena cava is normal in size with greater than 50% respiratory variability, suggesting right atrial pressure of 3 mmHg. FINDINGS  Left Ventricle: Left ventricular ejection fraction, by estimation, is 60 to 65%. The left ventricle has normal function. The left ventricle has no regional wall motion abnormalities. The left ventricular internal cavity size was normal in size. There is  no left ventricular hypertrophy. Left ventricular diastolic parameters are consistent with Grade II diastolic  dysfunction (pseudonormalization). Indeterminate filling pressures. Right Ventricle: The right ventricular size is normal. No increase in right ventricular wall thickness. Right ventricular systolic function is normal. There is normal pulmonary artery systolic pressure. The tricuspid  regurgitant velocity is 1.45 m/s, and  with an assumed right atrial pressure of 3 mmHg, the estimated right ventricular systolic pressure is [id number] mmHg. Left Atrium: Left atrial size was normal in size. Right Atrium: Right atrial size was normal in size. Pericardium: There is no evidence of pericardial effusion. Mitral Valve: The mitral valve is normal in structure. No evidence of mitral valve regurgitation. No evidence of mitral valve stenosis. Tricuspid Valve: The tricuspid valve is normal in structure. Tricuspid valve regurgitation is trivial. No evidence of tricuspid stenosis. Aortic Valve: The aortic valve is calcified. Aortic valve regurgitation is trivial. Mild aortic stenosis is present. Aortic valve mean gradient measures 19.0 mmHg. Aortic valve peak gradient measures 34.5 mmHg. Aortic valve area, by VTI measures 1.55 cm. Pulmonic Valve: The pulmonic valve was normal in structure. Pulmonic valve regurgitation is not visualized. No evidence of pulmonic stenosis. Aorta: The aortic root is normal in size and structure. Venous: The inferior vena cava is normal in size with greater than 50% respiratory variability, suggesting right atrial pressure of 3 mmHg. IAS/Shunts: No atrial level shunt detected by color flow Doppler.  LEFT VENTRICLE PLAX 2D LVIDd:         4.60 cm   Diastology LVIDs:         3.70 cm   LV e' medial:    7.80 cm/s LV PW:         1.30 cm   LV E/e' medial:  10.4 LV IVS:        1.20 cm   LV e' lateral:   11.60 cm/s LVOT diam:     2.20 cm   LV E/e' lateral: 7.0 LV SV:         89 LV SV Index:   40 LVOT Area:     3.80 cm  RIGHT VENTRICLE             IVC RV S prime:     16.60 cm/s  IVC diam: 2.10 cm TAPSE (M-mode): 1.5 cm LEFT ATRIUM             Index        RIGHT ATRIUM           Index LA diam:        4.60 cm 2.07 cm/m   RA Area:     15.10 cm LA Vol (A2C):   64.7 ml 29.14 ml/m  RA Volume:   31.80 ml  14.32 ml/m LA Vol (A4C):   58.7 ml 26.44 ml/m LA Biplane Vol: 62.9 ml 28.33 ml/m  AORTIC  VALVE AV Area (Vmax):    1.61 cm AV Area (Vmean):   1.68 cm AV Area (VTI):     1.55 cm AV Vmax:           293.50 cm/s AV Vmean:          189.800 cm/s AV VTI:            0.570 m AV Peak Grad:      34.5 mmHg AV Mean Grad:      19.0 mmHg LVOT Vmax:         124.00 cm/s LVOT Vmean:        83.833 cm/s LVOT VTI:          0.233 m LVOT/AV  VTI ratio: 0.41  AORTA Ao Root diam: 3.30 cm MITRAL VALVE               TRICUSPID VALVE MV Area (PHT): 4.68 cm    TR Peak grad:   8.4 mmHg MV Decel Time: 162 msec    TR Vmax:        145.00 cm/s MV E velocity: 81.00 cm/s MV A velocity: 65.50 cm/s  SHUNTS MV E/A ratio:  1.24        Systemic VTI:  0.23 m                            Systemic Diam: 2.20 cm Skeet Latch MD Electronically signed by Skeet Latch MD Signature Date/Time: 05/10/2022/10:48:39 AM    Final    CT HEAD CODE STROKE WO CONTRAST  Result Date: 05/10/2022 CLINICAL DATA:  Acute neurologic deficit EXAM: CT HEAD WITHOUT CONTRAST CT CERVICAL SPINE WITHOUT CONTRAST TECHNIQUE: Multidetector CT imaging of the head and cervical spine was performed following the standard protocol without intravenous contrast. Multiplanar CT image reconstructions of the cervical spine were also generated. RADIATION DOSE REDUCTION: This exam was performed according to the departmental dose-optimization program which includes automated exposure control, adjustment of the mA and/or kV according to patient size and/or use of iterative reconstruction technique. COMPARISON:  None Available. FINDINGS: CT HEAD FINDINGS Brain: There is no mass, hemorrhage or extra-axial collection. Generalized atrophy. Multiple old small vessel infarcts of the cerebellum. There is hypoattenuation of the periventricular white matter, most commonly indicating chronic ischemic microangiopathy. Old right frontal and parietal infarcts. Vascular: No abnormal hyperdensity of the major intracranial arteries or dural venous sinuses. No intracranial atherosclerosis. Skull:  The visualized skull base, calvarium and extracranial soft tissues are normal. Sinuses/Orbits: No fluid levels or advanced mucosal thickening of the visualized paranasal sinuses. No mastoid or middle ear effusion. The orbits are normal. ASPECTS ([place] Stroke Program Early CT Score) - Ganglionic level infarction (caudate, lentiform nuclei, internal capsule, insula, M1-M3 cortex): 7 - Supraganglionic infarction (M4-M6 cortex): 3 Total score (0-10 with 10 being normal): 10 CT CERVICAL SPINE FINDINGS Alignment: No static subluxation. Facets are aligned. Occipital condyles and the lateral masses of C1 and C2 are normally approximated. Skull base and vertebrae: No acute fracture. Soft tissues and spinal canal: No prevertebral fluid or swelling. No visible canal hematoma. Disc levels: No advanced spinal canal or neural foraminal stenosis. Upper chest: No pneumothorax, pulmonary nodule or pleural effusion. Other: Normal visualized paraspinal cervical soft tissues. IMPRESSION: 1. No acute intracranial abnormality. 2. ASPECTS is 10. 3. No acute fracture or static subluxation of the cervical spine. These results were communicated to Dr. Kerney Elbe at 12:24 am on 05/10/2022 by text page via the [facility] messaging system. Electronically Signed   By: Ulyses Jarred M.D.   On: 05/10/2022 00:47   CT C-SPINE NO CHARGE  Result Date: 05/10/2022 CLINICAL DATA:  Acute neurologic deficit EXAM: CT HEAD WITHOUT CONTRAST CT CERVICAL SPINE WITHOUT CONTRAST TECHNIQUE: Multidetector CT imaging of the head and cervical spine was performed following the standard protocol without intravenous contrast. Multiplanar CT image reconstructions of the cervical spine were also generated. RADIATION DOSE REDUCTION: This exam was performed according to the departmental dose-optimization program which includes automated exposure control, adjustment of the mA and/or kV according to patient size and/or use of iterative reconstruction technique. COMPARISON:   None Available. FINDINGS: CT HEAD FINDINGS Brain: There is no mass, hemorrhage or extra-axial collection.  Generalized atrophy. Multiple old small vessel infarcts of the cerebellum. There is hypoattenuation of the periventricular white matter, most commonly indicating chronic ischemic microangiopathy. Old right frontal and parietal infarcts. Vascular: No abnormal hyperdensity of the major intracranial arteries or dural venous sinuses. No intracranial atherosclerosis. Skull: The visualized skull base, calvarium and extracranial soft tissues are normal. Sinuses/Orbits: No fluid levels or advanced mucosal thickening of the visualized paranasal sinuses. No mastoid or middle ear effusion. The orbits are normal. ASPECTS ([place] Stroke Program Early CT Score) - Ganglionic level infarction (caudate, lentiform nuclei, internal capsule, insula, M1-M3 cortex): 7 - Supraganglionic infarction (M4-M6 cortex): 3 Total score (0-10 with 10 being normal): 10 CT CERVICAL SPINE FINDINGS Alignment: No static subluxation. Facets are aligned. Occipital condyles and the lateral masses of C1 and C2 are normally approximated. Skull base and vertebrae: No acute fracture. Soft tissues and spinal canal: No prevertebral fluid or swelling. No visible canal hematoma. Disc levels: No advanced spinal canal or neural foraminal stenosis. Upper chest: No pneumothorax, pulmonary nodule or pleural effusion. Other: Normal visualized paraspinal cervical soft tissues. IMPRESSION: 1. No acute intracranial abnormality. 2. ASPECTS is 10. 3. No acute fracture or static subluxation of the cervical spine. These results were communicated to Dr. Kerney Elbe at 12:24 am on 05/10/2022 by text page via the [facility] messaging system. Electronically Signed   By: Ulyses Jarred M.D.   On: 05/10/2022 00:47   CT ANGIO HEAD NECK W WO CM (CODE STROKE)  Result Date: 05/10/2022 CLINICAL DATA:  Acute neurologic deficit EXAM: CT ANGIOGRAPHY HEAD AND NECK TECHNIQUE: Multidetector  CT imaging of the head and neck was performed using the standard protocol during bolus administration of intravenous contrast. Multiplanar CT image reconstructions and MIPs were obtained to evaluate the vascular anatomy. Carotid stenosis measurements (when applicable) are obtained utilizing NASCET criteria, using the distal internal carotid diameter as the denominator. RADIATION DOSE REDUCTION: This exam was performed according to the departmental dose-optimization program which includes automated exposure control, adjustment of the mA and/or kV according to patient size and/or use of iterative reconstruction technique. COMPARISON:  None Available. FINDINGS: CTA NECK FINDINGS SKELETON: There is no bony spinal canal stenosis. No lytic or blastic lesion. OTHER NECK: 12 mm right parotid nodule (series 7 image 81). UPPER CHEST: No pneumothorax or pleural effusion. No nodules or masses. AORTIC ARCH: There is calcific atherosclerosis of the aortic arch. There is no aneurysm, dissection or hemodynamically significant stenosis of the visualized portion of the aorta. Normal variant aortic arch branching pattern with the left vertebral artery arising independently from the aortic arch. The visualized proximal subclavian arteries are widely patent. RIGHT CAROTID SYSTEM: Atherosclerotic web at the bifurcation. Mixed density plaque with less than 50% stenosis. Widely patent ICA. LEFT CAROTID SYSTEM: Calcific atherosclerosis at the bifurcation without hemodynamically significant stenosis. VERTEBRAL ARTERIES: Right dominant configuration. Both origins are clearly patent. There is no dissection, occlusion or flow-limiting stenosis to the skull base (V1-V3 segments). CTA HEAD FINDINGS POSTERIOR CIRCULATION: --Vertebral arteries: Right V4 segment atherosclerosis with widely maintained patency. --Inferior cerebellar arteries: Normal. --Basilar artery: Normal. --Superior cerebellar arteries: Normal. --Posterior cerebral arteries (PCA):  Normal. ANTERIOR CIRCULATION: --Intracranial internal carotid arteries: Atherosclerotic calcification of the internal carotid arteries at the skull base without hemodynamically significant stenosis. --Anterior cerebral arteries (ACA): Normal. Both A1 segments are present. Patent anterior communicating artery (a-comm). --Middle cerebral arteries (MCA): There is occlusion of the proximal M2 segment inferior division of the right MCA (series 10, image 114). Left MCA is normal.  VENOUS SINUSES: As permitted by contrast timing, patent. ANATOMIC VARIANTS: Fetal origin of the right posterior cerebral artery. Review of the MIP images confirms the above findings. IMPRESSION: 1. Occlusion of the proximal M2 segment inferior division of the right MCA. 2. 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 3. Nonspecific 12 mm right parotid nodule. 4. Critical Value/emergent results were called by telephone at the time of interpretation on 05/10/2022 at 12:38 am to provider ERIC [facility] , who verbally acknowledged these results. By Electronically Signed   By: Ulyses Jarred M.D.   On: 05/10/2022 00:39     PHYSICAL EXAM  Temp:  [97.5 F (36.4 C)-98.6 F (37 C)] 97.6 F (36.4 C) (02/26 1100) Pulse Rate:  [63-80] 76 (02/26 1100) Resp:  [18-20] 20 (02/26 1100) BP: (115-141)/(64-74) 115/66 (02/26 1100) SpO2:  [95 %-98 %] 95 % (02/26 1100)  General - Well nourished, well developed, in no apparent distress.  Ophthalmologic - fundi not visualized due to noncooperation.  Cardiovascular - irregularly irregular heart rate and rhythm. (Baseline diagnosis)  Neuro - awake, alert, eyes open, orientated to name, place, time. No aphasia but moderate dysarthria, following all simple commands.  Able to name and repeat with dysarthric voice.  Right gaze preference ut does cross midline with tracking. Left visual field cut, no blink to threat, neglect.   Left facial droop. Tongue protrusion  midline.  Motor: LUE deltoid, bicep and tricep 3/5, finger grip 1/5. Decreased control of movement. Left LE proximal 3/5, distal 4/5, lifts against gravity. RUE 5/5 and RLE 5/5.   Left sensory decreased with neglect.   Gait not tested.    ASSESSMENT/PLAN [name] is a 89 y.o. male with history of A-fib on Xarelto, hyperlipidemia, stroke in 05/2013 admitted for left-sided weakness and he fell at home. No tPA given due to outside window.    Stroke:  right large MCA infarct due to right M2 occlusion status post IR with  [id number], embolic secondary to A-fib given on Xarelto  CT no acute abnormality Repeat CT ordered 2/27 early AM  CTA head and neck right M2 proximal occlusion, right ICA bulb atherosclerosis IR with right M2 distal occlusion. S/p 4 passes, TICI2c  MRI large right MCA infarct MRA persistent right MCA occlusion although motion degraded 2D Echo EF 60 to 65% LDL 56 HgbA1c 5.8 Heparin subcu for VTE prophylaxis Xarelto (rivaroxaban) daily prior to admission, now on ASA 325 given large size of infarct. Will switch to eliquis on 2/27 if neuro stable//repeat CT stable.  Ongoing aggressive stroke risk factor management Therapy recommendations: CIR Disposition: Pending  History of stroke 05/2013 admitted for left-sided weakness and imbalance.  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  MRA/TTE/carotid Doppler negative.  Discharged on aspirin and Zocor. Outpatient 30-day CardioNet monitoring negative and loop recorder placed.  Found to have A-fib in 12/2014, started on Xarelto.  Mild left sided residual at baseline.  Chronic A-fib On Xarelto at home Currently [facility] on hold given large size of infarct Now on ASA 325 will switch to Eliquis on 2/27 if neuro stable/repeat CT stable.   Hypertension Home meds: toprol 12.5 Stable on the high end BP goal < 180/105 Off cleviprex Put on amlodipine and increase home toprol from 12.5 to 25 Long term BP  goal normotensive  Hyperlipidemia Home meds: Zocor 40 LDL 56, goal < 70 Now on lipitor 20 (zocor 40 equivalent, as amlodipine can increase the risk of rhabdo  when given with Zocor )  No high intensity statin given LDL at goal Continue statin at discharge  Dysphagia  Pass swallow On dys 1  puree and nectar thick liquid IVF added back d/t increased creatinine Encourage po intake  Other Stroke Risk Factors Advanced age Obesity, Body mass index is 30.68 kg/m.   Other Active Problems CKD 3A, creatinine 1.30-1.43-1.13-1.03 - 1.26 Will add IVF Mild leukocytosis, WBC 11.6-8.7-7.4 - 7.0 Insomnia - Ambien stopped. Trazodone PRN started, received 2/25 pm only.   Hospital day # 5  Pt seen by Neuro NP/APP and later by MD. Note/plan to be edited by MD as needed.    Otelia Santee, DNP, AGACNP-BC [facility] Please use AMION for pager and EPIC for messaging   05/15/2022 12:12 PM  I have personally obtained history,examined this patient, reviewed notes, independently viewed imaging studies, participated in medical decision making and plan of care.ROS completed by me personally and pertinent positives fully documented  I have made any additions or clarifications directly to the above note. Agree with note above.  Patient continues to have left hemiplegia and visual field loss.  Plan to check CT head tomorrow and decide on starting anticoagulation.  Long discussion with patient and daughter at the bedside and answered questions.  Greater than 50% time during this 35-minute visit spent in counseling and coordination of care about his embolic stroke and discussion about risk-benefit of anticoagulation in the setting of A-fib and large stroke and answering questions.  Antony Contras, MD Medical Director [facility] Pager: [phone] 05/15/2022 4:39 PM   To contact Stroke Continuity provider, please refer to [email or web address] After hours, contact General Neurology

## 2022-05-15 NOTE — Care Management Important Message (Signed)
Important Message  Patient Details  Name: Edward Maynard MRN: [id number] Date of Birth: 1933/11/25   Medicare Important Message Given:  Yes     [name] 05/15/2022, 4:11 PM

## 2022-05-15 NOTE — Progress Notes (Signed)
Occupational Therapy Treatment Patient Details Name: DEVINDRA SHOWS MRN: [id number] DOB: [birth year] Today's Date: 05/15/2022   History of present illness Pt is an 89 y.o. male who presented 05/10/22 with L-sided weakness and a fall. Outside window for tPA. 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 PMH: A-fib on Xarelto, HLD, CVA in 05/2013   OT comments  Patient supine completing breakfast with verbal cues to locate items on left. Patient demonstrated gains with bed mobility with patient able to get legs to EOB and required assistance with trunk. Patient demonstrated posterior and left lateral leaning on EOB with verbal cues to correct with mod assist initially and progressed to min guard. Patient was mod assist x2 to stand from EOB into stedy and min assist from stedy pads. Patient placed in front of mirror and was able to correct posture and alignment with visual and verbal cues. Patient assisted to recliner with stedy and stood from recliner to address balance without stedy. Upon leaving patient demonstrated Active movement and LUE shoulder and elbow. Patient has good potential for further gains with continued rehab to address self care, transfers, standing balance, and increasing functional use of LUE. Acute OT to continue to follow.    Recommendations for follow up therapy are one component of a multi-disciplinary discharge planning process, led by the attending physician.  Recommendations may be updated based on patient status, additional functional criteria and insurance authorization.    Follow Up Recommendations  Acute inpatient rehab (3hours/day)     Assistance Recommended at Discharge Frequent or constant Supervision/Assistance  Patient can return home with the following  Two people to help with walking and/or transfers;A lot of help with bathing/dressing/bathroom;Assistance with cooking/housework;Help with  stairs or ramp for entrance;Assist for transportation   Equipment Recommendations  Other (comment) (Defer to next venue)    Recommendations for Other Services      Precautions / Restrictions Precautions Precautions: Fall Restrictions Weight Bearing Restrictions: No       Mobility Bed Mobility Overal bed mobility: Needs Assistance Bed Mobility: Supine to Sit     Supine to sit: Mod assist, HOB elevated     General bed mobility comments: cues to use rail and patient able to move BLEs off EOB, assistance needed with lifting trunk    Transfers Overall transfer level: Needs assistance Equipment used: Ambulation equipment used Transfers: Sit to/from Stand, Bed to chair/wheelchair/BSC Sit to Stand: Mod assist, +2 physical assistance           General transfer comment: Stood from EOB with mod assist x2 into stedy, able to stand from stedy with min assist.  Performed standing from recliner with  2 person HHA and max assist x2 to power up Transfer via Lift Equipment: Stedy   Balance Overall balance assessment: Needs assistance Sitting-balance support: Bilateral upper extremity supported, Feet supported Sitting balance-Leahy Scale: Poor Sitting balance - Comments: posterior and left lateral leaning while on EOB with cues to correct and min to mod assist initially and progressed to min guard Postural control: Posterior lean, Left lateral lean Standing balance support: Single extremity supported, During functional activity, Reliant on assistive device for balance Standing balance-Leahy Scale: Poor Standing balance comment: reliant on external support when stand with Stedy and 2 person HHA due to left lateral leaning but is able to correct with cues and visual aide with mirror.  ADL either performed or assessed with clinical judgement   ADL Overall ADL's : Needs assistance/impaired Eating/Feeding: Supervision/ safety Eating/Feeding Details  (indicate cue type and reason): cues to locate items on left Grooming: Wash/dry face;Brushing hair;Minimal assistance Grooming Details (indicate cue type and reason): performd from Joanna at sink with cues to attend to left                                    Extremity/Trunk Assessment              Vision       Perception     Praxis      Cognition Arousal/Alertness: Awake/alert Behavior During Therapy: Flat affect Overall Cognitive Status: Impaired/Different from baseline Area of Impairment: Attention, Awareness, Problem solving                     Memory: Decreased short-term memory, Decreased recall of precautions Following Commands: Follows one step commands with increased time, Follows one step commands consistently Safety/Judgement: Decreased awareness of safety, Decreased awareness of deficits Awareness: Intellectual Problem Solving: Slow processing, Decreased initiation, Difficulty sequencing, Requires verbal cues, Requires tactile cues General Comments: continues to have left visual field and left neglect but able to attend to left with cues        Exercises      Shoulder Instructions       General Comments      Pertinent Vitals/ Pain       Pain Assessment Pain Assessment: Faces Faces Pain Scale: No hurt Pain Intervention(s): Monitored during session  Home Living                                          Prior Functioning/Environment              Frequency  Min 2X/week        Progress Toward Goals  OT Goals(current goals can now be found in the care plan section)  Progress towards OT goals: Progressing toward goals  Acute Rehab OT Goals Patient Stated Goal: get better OT Goal Formulation: With patient/family Time For Goal Achievement: 05/24/22 Potential to Achieve Goals: Good ADL Goals Pt Will Perform Grooming: with set-up;sitting Pt Will Perform Upper Body Bathing: with min assist;sitting Pt  Will Perform Lower Body Bathing: with mod assist;sit to/from stand;sitting/lateral leans Pt Will Transfer to Toilet: with min assist;with +2 assist;bedside commode Pt/caregiver will Perform Home Exercise Program: Increased strength;Left upper extremity;With Supervision;With written HEP provided Additional ADL Goal #1: patient will increase left side awareness physically and visually by attending to left the left side 3/5 times when cued during a treatment session.  Plan Discharge plan remains appropriate;Frequency remains appropriate    Co-evaluation    PT/OT/SLP Co-Evaluation/Treatment: Yes Reason for Co-Treatment: For patient/therapist safety;To address functional/ADL transfers   OT goals addressed during session: ADL's and self-care      AM-PAC OT "6 Clicks" Daily Activity     Outcome Measure   Help from another person eating meals?: A Little Help from another person taking care of personal grooming?: A Little Help from another person toileting, which includes using toliet, bedpan, or urinal?: A Lot Help from another person bathing (including washing, rinsing, drying)?: A Lot Help from another person to put on and taking off regular upper body clothing?: A  Lot Help from another person to put on and taking off regular lower body clothing?: Total 6 Click Score: 13    End of Session Equipment Utilized During Treatment: Gait belt;Other (comment) Charlaine Dalton)  OT Visit Diagnosis: Unsteadiness on feet (R26.81);Muscle weakness (generalized) (M62.81);Apraxia (R48.2);Hemiplegia and hemiparesis Hemiplegia - Right/Left: Left Hemiplegia - dominant/non-dominant: Non-Dominant Hemiplegia - caused by: Cerebral infarction   Activity Tolerance Patient tolerated treatment well   Patient Left in chair;with call bell/phone within reach;with chair alarm set;with family/visitor present   Nurse Communication Mobility status;Need for lift equipment        Time: [phone] OT Time Calculation (min):  37 min  Charges: OT General Charges $OT Visit: 1 Visit OT Treatments $Self Care/Home Management : 8-22 mins  Lodema Hong, [place]  Office [place] 05/15/2022, 12:42 PM

## 2022-05-15 NOTE — Progress Notes (Signed)
Physical Therapy Treatment Patient Details Name: Edward Maynard MRN: [id number] DOB: 04-01-1933 Today's Date: 05/15/2022   History of Present Illness Pt is an 89 y.o. male who presented 05/10/22 with L-sided weakness and a fall. Outside window for tPA. 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 PMH: A-fib on Xarelto, HLD, CVA in 05/2013    PT Comments    Pt received in bed and very motivated to mobilize, daughter present during session. Worked on maintaining midline in different positions and through transitional mvmts and pt did very well with visual feedback from mirror. Pt began transfers within stedy, needing mod A +2 to stand from bed and min A from flaps of stedy. Progressed to sit>stand from recliner without AD but needed max A +2 with this due to lack of LLE control. Continue to recommend AIR at d/c. PT will continue to follow.    Recommendations for follow up therapy are one component of a multi-disciplinary discharge planning process, led by the attending physician.  Recommendations may be updated based on patient status, additional functional criteria and insurance authorization.  Follow Up Recommendations  Acute inpatient rehab (3hours/day)     Assistance Recommended at Discharge Frequent or constant Supervision/Assistance  Patient can return home with the following Two people to help with walking and/or transfers;Two people to help with bathing/dressing/bathroom;Assistance with cooking/housework;Direct supervision/assist for medications management;Direct supervision/assist for financial management;Assist for transportation;Help with stairs or ramp for entrance;Assistance with feeding   Equipment Recommendations  Other (comment) (defer to AIR)    Recommendations for Other Services Rehab consult     Precautions / Restrictions Precautions Precautions: Fall Precaution Comments: BP goal 120 - 140 within  24 hours of IR; Left hemi; Left homonymous hemianopia Restrictions Weight Bearing Restrictions: No     Mobility  Bed Mobility Overal bed mobility: Needs Assistance Bed Mobility: Supine to Sit     Supine to sit: Mod assist, HOB elevated, +2 for safety/equipment     General bed mobility comments: pt able to bring BLE's off bed without assist, mod A at trunk from behind and +2 in front of pt for safety coming to EOB    Transfers Overall transfer level: Needs assistance Equipment used: Ambulation equipment used Transfers: Sit to/from Stand, Bed to chair/wheelchair/BSC Sit to Stand: Mod assist, +2 physical assistance Stand pivot transfers: +2 physical assistance, +2 safety/equipment, Total assist         General transfer comment: Stood from EOB with mod assist x2 into stedy, able to stand from stedy with min assist.  Performed standing from recliner with  2 person HHA and max assist x2 to power up. Pt lacking L knee control and with L ankle and toes in df with hip adduction in standing Transfer via Lift Equipment: Stedy  Ambulation/Gait               General Gait Details: unable   Stairs             Wheelchair Mobility    Modified Rankin (Stroke Patients Only) Modified Rankin (Stroke Patients Only) Pre-Morbid Rankin Score: No symptoms Modified Rankin: Severe disability     Balance Overall balance assessment: Needs assistance Sitting-balance support: Bilateral upper extremity supported, Feet supported Sitting balance-Leahy Scale: Poor Sitting balance - Comments: posterior and left lateral leaning while on EOB with cues to correct and min to mod assist initially and progressed to min guard. Did very well in front of  mirror, initiated self correction Postural control: Posterior lean, Left lateral lean Standing balance support: Single extremity supported, During functional activity, Reliant on assistive device for balance Standing balance-Leahy Scale:  Poor Standing balance comment: reliant on external support when stand with Stedy and 2 person HHA due to left lateral leaning but is able to correct with cues and visual aide with mirror.               High Level Balance Comments: worked on perched sitting as well as sit>stand and maintaining standing in front of mirror, pt able to keep midline positioning when watching self            Cognition Arousal/Alertness: Awake/alert Behavior During Therapy: Flat affect Overall Cognitive Status: Impaired/Different from baseline Area of Impairment: Attention, Awareness, Problem solving                   Current Attention Level: Sustained Memory: Decreased short-term memory, Decreased recall of precautions Following Commands: Follows one step commands with increased time, Follows one step commands consistently Safety/Judgement: Decreased awareness of safety, Decreased awareness of deficits Awareness: Intellectual Problem Solving: Slow processing, Decreased initiation, Difficulty sequencing, Requires verbal cues, Requires tactile cues General Comments: continues to have left visual field cut and left neglect but very responsive to cues and beginning to initiate on his own        Exercises      General Comments General comments (skin integrity, edema, etc.): VSS on RA      Pertinent Vitals/Pain Pain Assessment Pain Assessment: Faces Faces Pain Scale: No hurt    Home Living                          Prior Function            PT Goals (current goals can now be found in the care plan section) Acute Rehab PT Goals Patient Stated Goal: to improve PT Goal Formulation: With patient Time For Goal Achievement: 05/24/22 Potential to Achieve Goals: Good Progress towards PT goals: Progressing toward goals    Frequency    Min 4X/week      PT Plan Current plan remains appropriate    Co-evaluation PT/OT/SLP Co-Evaluation/Treatment: Yes Reason for  Co-Treatment: For patient/therapist safety;To address functional/ADL transfers PT goals addressed during session: Mobility/safety with mobility;Balance OT goals addressed during session: ADL's and self-care      AM-PAC PT "6 Clicks" Mobility   Outcome Measure  Help needed turning from your back to your side while in a flat bed without using bedrails?: A Lot Help needed moving from lying on your back to sitting on the side of a flat bed without using bedrails?: Total Help needed moving to and from a bed to a chair (including a wheelchair)?: Total Help needed standing up from a chair using your arms (e.g., wheelchair or bedside chair)?: Total Help needed to walk in hospital room?: Total Help needed climbing 3-5 steps with a railing? : Total 6 Click Score: 7    End of Session Equipment Utilized During Treatment: Gait belt Activity Tolerance: Patient tolerated treatment well Patient left: in chair;with call bell/phone within reach;with chair alarm set Nurse Communication: Mobility status;Need for lift equipment (stedy) PT Visit Diagnosis: Unsteadiness on feet (R26.81);Other abnormalities of gait and mobility (R26.89);Muscle weakness (generalized) (M62.81);Difficulty in walking, not elsewhere classified (R26.2);Other symptoms and signs involving the nervous system (R29.898);Hemiplegia and hemiparesis Hemiplegia - Right/Left: Left Hemiplegia - dominant/non-dominant: Non-dominant Hemiplegia - caused by:  Cerebral infarction     Time: [id number] PT Time Calculation (min) (ACUTE ONLY): 37 min  Charges:  $Therapeutic Activity: 8-22 mins                     Leighton Roach, PT  Acute Rehab Services Secure chat preferred Office [place] 05/15/2022, 1:22 PM

## 2022-05-16 ENCOUNTER — Other Ambulatory Visit (HOSPITAL_COMMUNITY): Payer: Self-pay

## 2022-05-16 ENCOUNTER — Telehealth (HOSPITAL_COMMUNITY): Payer: Self-pay

## 2022-05-16 ENCOUNTER — Inpatient Hospital Stay (HOSPITAL_COMMUNITY): Payer: Medicare Other

## 2022-05-16 DIAGNOSIS — R54 Age-related physical debility: Secondary | ICD-10-CM | POA: Diagnosis present

## 2022-05-16 DIAGNOSIS — R1310 Dysphagia, unspecified: Secondary | ICD-10-CM

## 2022-05-16 DIAGNOSIS — I10 Essential (primary) hypertension: Secondary | ICD-10-CM | POA: Diagnosis present

## 2022-05-16 DIAGNOSIS — E669 Obesity, unspecified: Secondary | ICD-10-CM | POA: Diagnosis present

## 2022-05-16 LAB — CBC
HCT: 44.2 % (ref 39.0–52.0)
Hemoglobin: 15.3 g/dL (ref 13.0–17.0)
MCH: 33.3 pg (ref 26.0–34.0)
MCHC: 34.6 g/dL (ref 30.0–36.0)
MCV: 96.1 fL (ref 80.0–100.0)
Platelets: 171 10*3/uL (ref 150–400)
RBC: 4.6 MIL/uL (ref 4.22–5.81)
RDW: 13.5 % (ref 11.5–15.5)
WBC: 7.1 10*3/uL (ref 4.0–10.5)
nRBC: 0 % (ref 0.0–0.2)

## 2022-05-16 LAB — BASIC METABOLIC PANEL
Anion gap: 10 (ref 5–15)
BUN: 27 mg/dL — ABNORMAL HIGH (ref 8–23)
CO2: 21 mmol/L — ABNORMAL LOW (ref 22–32)
Calcium: 8.7 mg/dL — ABNORMAL LOW (ref 8.9–10.3)
Chloride: 107 mmol/L (ref 98–111)
Creatinine, Ser: 1.04 mg/dL (ref 0.61–1.24)
GFR, Estimated: >60 mL/min (ref >60)
Glucose, Bld: 118 mg/dL — ABNORMAL HIGH (ref 70–99)
Potassium: 3.7 mmol/L (ref 3.5–5.1)
Sodium: 138 mmol/L (ref 135–145)

## 2022-05-16 MED ORDER — ENSURE ENLIVE PO LIQD
237.0000 mL | Freq: Three times a day (TID) | ORAL | Status: DC
  Administered 2022-05-16 – 2022-05-18 (×7): 237 mL via ORAL

## 2022-05-16 MED ORDER — DOCUSATE SODIUM 100 MG PO CAPS
100.0000 mg | ORAL_CAPSULE | Freq: Every day | ORAL | Status: DC
  Administered 2022-05-17: 100 mg via ORAL
  Filled 2022-05-16: qty 1

## 2022-05-16 MED ORDER — APIXABAN 5 MG PO TABS
5.0000 mg | ORAL_TABLET | Freq: Two times a day (BID) | ORAL | Status: DC
  Administered 2022-05-16 – 2022-05-18 (×5): 5 mg via ORAL
  Filled 2022-05-16 (×5): qty 1

## 2022-05-16 MED ORDER — POTASSIUM CHLORIDE CRYS ER 20 MEQ PO TBCR
20.0000 meq | EXTENDED_RELEASE_TABLET | Freq: Two times a day (BID) | ORAL | Status: AC
  Administered 2022-05-16 (×2): 20 meq via ORAL
  Filled 2022-05-16 (×2): qty 1

## 2022-05-16 MED ORDER — BISACODYL 10 MG RE SUPP: 10.0000 mg | Freq: Every day | RECTAL | Status: DC | PRN

## 2022-05-16 NOTE — Discharge Instructions (Signed)

## 2022-05-16 NOTE — TOC Initial Note (Signed)
Transition of Care [facility]) - Initial/Assessment Note    Patient Details  Name: Edward Maynard MRN: [id number] Date of Birth: Apr 08, 1933  Transition of Care [facility]) CM/SW Contact:    Geralynn Ochs, LCSW Phone Number: 05/16/2022, 3:30 PM  Clinical Narrative:      CSW updated by Rehab Admissions that patient will need SNF due to caregiver support. CSW faxed out referral and spoke with daughter, [name], to provide offers. [name] initially under the impression that patient would admit to CIR first and then SNF, and CSW had to explain that SNF would be replacing CIR as the patient could not go home afterwards. [name] was upset by the information and asked for time to process, asking to discuss again tomorrow. CSW provided [name] with bed offers for her to review. [name] also asked about assistance in finding somewhere for the patient and wife to move into long term, hopefully somewhere together, and CSW discussed a referral to [facility]. [name] in agreement. CSW contacted liaison with [facility] and provided information, she will contact daughter to discuss care needs. CSW to follow.             Expected Discharge Plan: Skilled Nursing Facility Barriers to Discharge: Continued Medical Work up, Ship broker   Patient Goals and CMS Choice Patient states their goals for this hospitalization and ongoing recovery are:: to get better CMS Medicare.gov Compare Post Acute Care list provided to:: Patient Represenative (must comment) Choice offered to / list presented to : Adult [place] ownership interest in [facility].provided to:: Adult Children    Expected Discharge Plan and Services     Post Acute Care Choice: [place] arrangements for the past 2 months: Single Family Home                                      Prior Living Arrangements/Services Living arrangements for the past 2 months: Single Family Home Lives with::  Spouse Patient language and need for interpreter reviewed:: No Do you feel safe going back to the place where you live?: Yes      Need for Family Participation in Patient Care: Yes (Comment) Care giver support system in place?: No (comment)   Criminal Activity/Legal Involvement Pertinent to Current Situation/Hospitalization: No - Comment as needed  Activities of Daily Living      Permission Sought/Granted Permission sought to share information with : Facility Sport and exercise psychologist, Family Supports Permission granted to share information with : Yes, Verbal Permission Granted  Share Information with NAME: [name]  Permission granted to share info w AGENCY: SNF  Permission granted to share info w Relationship: Daughter     Emotional Assessment       Orientation: : Oriented to Self, Oriented to Place, Oriented to Situation Alcohol / Substance Use: Not Applicable Psych Involvement: No (comment)  Admission diagnosis:  Stroke (cerebrum) ([place]) [I63.9] Middle cerebral artery embolism, right [I66.01] Cerebrovascular accident (CVA) due to occlusion of right middle cerebral artery ([place]) [I63.511] Patient Active Problem List   Diagnosis Date Noted   Stroke (cerebrum) ([place]) 05/10/2022   Middle cerebral artery embolism, 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   Chronic a-fib ([place]) 05/10/2022   Paroxysmal atrial fibrillation ([place]) 01/18/2015   Chronic anticoagulation 01/18/2015   PVC's (premature ventricular contractions) 12/14/2014   NSVT (nonsustained ventricular tachycardia) ([place]) 12/14/2014   Cerebral infarction due to embolism of cerebral artery (  [place]) 07/10/2014   Hyperlipidemia 12/12/2013   Personal history of colonic polyp - adenoma  10/23/2013   PCP:  Gaynelle Arabian, MD Pharmacy:   [id number] [place] [address] [place]: [phone]: [phone] [facility] ([address] [place]Phone: [phone]: [phone] [address] [address]Phone: [phone]: [phone]    Social Determinants of Health (SDOH) Social History: SDOH Screenings   Tobacco Use: Medium Risk (05/11/2022)   SDOH Interventions:     Readmission Risk Interventions     No data to display

## 2022-05-16 NOTE — Progress Notes (Signed)
Physical Therapy Treatment Patient Details Name: ARIANA RICHWINE MRN: [id number] DOB: April 15, 1933 Today's Date: 05/16/2022   History of Present Illness Pt is an 89 y.o. male who presented 05/10/22 with L-sided weakness and a fall. Outside window for tPA. 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 PMH: A-fib on Xarelto, HLD, CVA in 05/2013    PT Comments    Session today focused on lateral transfers to R to simulate bed to w/c, drop arm recliner used. Pt had difficulty scooting R due to L lean. Max A needed for scooting and control of LLE/LUE. Pt with improvement in midline sitting today. Worked on increased attention to L arm during mobility and maintaining controlled motion of LLE with there ex. COntinue to strongly recommend AIR level therapies. PT will continue to follow.    Recommendations for follow up therapy are one component of a multi-disciplinary discharge planning process, led by the attending physician.  Recommendations may be updated based on patient status, additional functional criteria and insurance authorization.  Follow Up Recommendations  Acute inpatient rehab (3hours/day)     Assistance Recommended at Discharge Frequent or constant Supervision/Assistance  Patient can return home with the following Two people to help with walking and/or transfers;Two people to help with bathing/dressing/bathroom;Assistance with cooking/housework;Direct supervision/assist for medications management;Direct supervision/assist for financial management;Assist for transportation;Help with stairs or ramp for entrance;Assistance with feeding   Equipment Recommendations  Other (comment) (defer to AIR)    Recommendations for Other Services Rehab consult     Precautions / Restrictions Precautions Precautions: Fall Precaution Comments: Left hemi; Left homonymous hemianopsia Restrictions Weight Bearing Restrictions: No      Mobility  Bed Mobility Overal bed mobility: Needs Assistance Bed Mobility: Rolling, Sidelying to Sit Rolling: Mod assist Sidelying to sit: Mod assist       General bed mobility comments: rolled to R with assist to bridge L knee to help push with LLE while R hand holding LUE. Mod A for LE's off EOB and elevation of trunk    Transfers Overall transfer level: Needs assistance Equipment used: None Transfers: Bed to chair/wheelchair/BSC            Lateral/Scoot Transfers: Max assist, +2 physical assistance General transfer comment: focused today on simulated w/c transfers to R, lateral scoot from bed to drop arm recliner. Pt limited by increased tone LUE and LLE when extering himself with R side. Has difficulty scooting R due to L lean. Better performance when fwd lean maintained. Max A +2 to achieve lateral scoot    Ambulation/Gait               General Gait Details: unable   Stairs             Wheelchair Mobility    Modified Rankin (Stroke Patients Only) Modified Rankin (Stroke Patients Only) Pre-Morbid Rankin Score: No symptoms Modified Rankin: Severe disability     Balance Overall balance assessment: Needs assistance Sitting-balance support: Bilateral upper extremity supported, Feet supported Sitting balance-Leahy Scale: Poor Sitting balance - Comments: pt able to maintain sitting EOB with close supervision, improvement even from yesterday, occasional min A to prevent excessive fwd lean Postural control: Left lateral lean                     High Level Balance Comments: worked on perched sitting as well as sit>stand and maintaining standing in front of mirror, pt able to keep  midline positioning when watching self            Cognition Arousal/Alertness: Awake/alert Behavior During Therapy: Flat affect Overall Cognitive Status: Impaired/Different from baseline Area of Impairment: Attention, Awareness, Problem solving                    Current Attention Level: Sustained Memory: Decreased short-term memory, Decreased recall of precautions Following Commands: Follows one step commands with increased time, Follows one step commands consistently Safety/Judgement: Decreased awareness of safety, Decreased awareness of deficits Awareness: Intellectual Problem Solving: Slow processing, Decreased initiation, Difficulty sequencing, Requires verbal cues, Requires tactile cues General Comments: worked on having pt care of L arm during mobility, esp bed mobility. Pt needed frequent vc's but was able to achieve limb protection with rolling        Exercises Other Exercises Other Exercises: controlled motion of LLE while pt looking at it including: ankle df and pf, knee ext and flexion    General Comments General comments (skin integrity, edema, etc.): VSS, daughter present. Good appetite      Pertinent Vitals/Pain Pain Assessment Pain Assessment: Faces Faces Pain Scale: Hurts little more Pain Location: L hip, especially with compression at hip joint Pain Descriptors / Indicators: Sore Pain Intervention(s): Repositioned    Home Living                          Prior Function            PT Goals (current goals can now be found in the care plan section) Acute Rehab PT Goals Patient Stated Goal: to improve PT Goal Formulation: With patient Time For Goal Achievement: 05/24/22 Potential to Achieve Goals: Good Progress towards PT goals: Progressing toward goals    Frequency    Min 4X/week      PT Plan Current plan remains appropriate    Co-evaluation              AM-PAC PT "6 Clicks" Mobility   Outcome Measure  Help needed turning from your back to your side while in a flat bed without using bedrails?: A Lot Help needed moving from lying on your back to sitting on the side of a flat bed without using bedrails?: Total Help needed moving to and from a bed to a chair (including a  wheelchair)?: Total Help needed standing up from a chair using your arms (e.g., wheelchair or bedside chair)?: Total Help needed to walk in hospital room?: Total Help needed climbing 3-5 steps with a railing? : Total 6 Click Score: 7    End of Session Equipment Utilized During Treatment: Gait belt Activity Tolerance: Patient tolerated treatment well Patient left: in chair;with call bell/phone within reach;with chair alarm set;with family/visitor present Nurse Communication: Mobility status;Need for lift equipment (stedy) PT Visit Diagnosis: Unsteadiness on feet (R26.81);Other abnormalities of gait and mobility (R26.89);Muscle weakness (generalized) (M62.81);Difficulty in walking, not elsewhere classified (R26.2);Other symptoms and signs involving the nervous system (R29.898);Hemiplegia and hemiparesis Hemiplegia - Right/Left: Left Hemiplegia - dominant/non-dominant: Non-dominant Hemiplegia - caused by: Cerebral infarction     Time: [id number] PT Time Calculation (min) (ACUTE ONLY): 30 min  Charges:  $Therapeutic Exercise: 8-22 mins $Therapeutic Activity: 8-22 mins                     Leighton Roach, PT  Acute Rehab Services Secure chat preferred [place] 05/16/2022, 9:45 AM

## 2022-05-16 NOTE — Progress Notes (Signed)
ANTICOAGULATION CONSULT NOTE - Initial Consult  Pharmacy Consult for Apixaban Indication: atrial fibrillation  No Known Allergies  Patient Measurements: Height: 5' 11.5" (181.6 cm) Weight: 101.2 kg (223 lb 1.7 oz) IBW/kg (Calculated) : 76.45  Vital Signs: Temp: 98.9 F (37.2 C) (02/27 0822) Temp Source: Axillary (02/27 0822) BP: 143/81 (02/27 0822) Pulse Rate: 72 (02/27 0822)  Labs: Recent Labs    05/14/22 0533 05/16/22 0625  HGB 15.7 15.3  HCT 44.1 44.2  PLT 163 171  CREATININE 1.26* 1.04    Estimated Creatinine Clearance: 62.3 mL/min (by C-G formula based on SCr of 1.04 mg/dL).   Medical History: Past Medical History:  Diagnosis Date   High cholesterol    05/2013; no defecits   Hyperlipemia    Left arm weakness    Left hand weakness    Personal history of colonic polyp - adenoma  10/23/2013   Stroke ([place]) 05/2013   MRI on 07/02/13 = Multifocal acute & subacute infarction involving right frontal MCA/ACA & left parietal MCA/PCA watershed areas    Medications:  Medications Prior to Admission  Medication Sig Dispense Refill Last Dose   metoprolol succinate (TOPROL XL) 25 MG 24 hr tablet Take 0.5 tablets (12.5 mg total) by mouth daily. 45 tablet 3 05/08/2022 at 2000   rivaroxaban (XARELTO) 20 MG TABS tablet TAKE 1 TABLET BY MOUTH DAILY  WITH SUPPER (Patient taking differently: Take 20 mg by mouth daily with supper.) 100 tablet 3 05/08/2022 at 2000   simvastatin (ZOCOR) 40 MG tablet Take 1 tablet by mouth  daily at 6 PM. 90 tablet 3 05/08/2022 at 2000    Assessment: 89 yo M presented with acute CVA now s/p revasularization 2/21.  Pt was on Xarelto PTA (last dose 2/19).  Repeat CT scan stable - ok to restart anticoagulation per neurology, who has consulted pharmacy to start Apixaban.    Goal of Therapy:  Therapeutic anticoagulation Monitor platelets by anticoagulation protocol: Yes   Plan:  Start Apixaban '5mg'$  PO BID Discontinue subq heaprin. Will reach out to neuro  to clarify ASA dose with new start Apixaban.  Currently on ASA 325 daily.  [employer], Pharm.D., BCPS Clinical Pharmacist Clinical phone for 05/16/2022 from 7:30-3:00 is [phone].  **Pharmacist phone directory can be found on [email or web address] listed under [place].  05/16/2022 9:06 AM

## 2022-05-16 NOTE — Progress Notes (Signed)
Speech Language Pathology Treatment: Dysphagia;Cognitive-Linquistic  Patient Details Name: Edward Maynard MRN: [id number] DOB: [birth year] Today's Date: 05/16/2022 Time: [id number] SLP Time Calculation (min) (ACUTE ONLY): 13 min  Assessment / Plan / Recommendation Clinical Impression  SWALLOWING Pt seen for ongoing dysphagia management.  Pt with L pocketing of 1/2 pill during medication administration.  SLP removed with digital sweep.  Reattempted medication (large pill broken in half) with puree.  Pt benefited from verbal cues to swallow everything at once and achieved adequate oral clearance on these 2 subsequent trials.  Pt with immediate coughing and throat clear with thin liquid.  Initially good tolerance on first 3-4 sips, but with increasing cough following additional trials.  Pt tolerated nectar thick liquid with only single instance of throat clear.  Pt declined denture placement today and declined trials of solids.  Recommend continuing puree diet with nectar thick liquids.   COGNITIVE-LINGUISTICS Pt's speech clarity is improving.  Pt was 100% intelligible during today's session without any cues to utilize dysarthria strategies It is unclear how aware pt is of deficits following stroke.  He did state that he wasn't doing much walking, because he didn't think he could when asked specifically about that.  He notes improvement in LUE function and raised his arm, but demonstrated and states that he has difficulty with controlled lowering. Pt demonstrated awareness of L facial impairments and requested SLP place straw on R side of oral cavity.    HPI HPI: 89 year old male with atrial fibrillation (on Xarelto), prior stroke, presenting with acute onset of left hemiplegia and left hemineglect after sustaining a fall to the left while at home this evening. Underwent thrombectomy      SLP Plan  Continue with current plan of care      Recommendations for follow up therapy are one component of  a multi-disciplinary discharge planning process, led by the attending physician.  Recommendations may be updated based on patient status, additional functional criteria and insurance authorization.    Recommendations  Diet recommendations: Dysphagia 1 (puree);Nectar-thick liquid Medication Administration: Whole meds with puree Supervision: Staff to assist with self feeding Compensations: Slow rate;Small sips/bites Postural Changes and/or Swallow Maneuvers: Seated upright 90 degrees                Oral Care Recommendations: Oral care BID;Oral care prior to ice chip/H20 Follow Up Recommendations:  (Continue ST at next level of care) SLP Visit Diagnosis: Dysphagia, oropharyngeal phase (R13.12) Frontal lobe and executive function deficit following: Cerebral infarction Plan: Continue with current plan of care           Celedonio Savage, [place], [place] Office: [phone] 05/16/2022, 12:42 PM

## 2022-05-16 NOTE — Progress Notes (Signed)
Inpatient Rehab Admissions Coordinator:   Met with pt and his daughter at bedside to discuss caregiver support and discharge plan.  Per daughter, [name], family does not have the ability to provide 24/7 physical assist at discharge and they are hoping to find a facility where pt and his spouse can be together.  Notified TOC.  We will sign off at this time.   Shann Medal, PT, DPT Admissions Coordinator [phone] 05/16/22  11:09 AM

## 2022-05-16 NOTE — Progress Notes (Addendum)
STROKE TEAM PROGRESS NOTE   SUBJECTIVE (INTERVAL HISTORY)  Daughter, [name], at bedside.  Assessment and plan of care discussed. Repeat CT imaging negative.  Patient started on Eliquis this a.m. Neuro exam seems slightly improved with improved LUE strength and control of movement.  Planning for discharge to [facility].   OBJECTIVE Temp:  [97.5 F (36.4 C)-99.2 F (37.3 C)] 98.9 F (37.2 C) (02/27 0822) Pulse Rate:  [72-86] 72 (02/27 0822) Cardiac Rhythm: Normal sinus rhythm (02/27 0700) Resp:  [16-20] 18 (02/27 0822) BP: (119-143)/(56-81) 143/81 (02/27 0822) SpO2:  [93 %-97 %] 97 % (02/27 0822)  Recent Labs  Lab 05/10/22 0003  GLUCAP 135*    Recent Labs  Lab 05/10/22 0545 05/11/22 1246 05/12/22 0631 05/14/22 0533 05/16/22 0625  NA 135 135 137 135 138  K 3.8 3.9 3.8 4.0 3.7  CL 103 103 102 101 107  CO2 '22 27 23 25 '$ 21*  GLUCOSE 183* 123* 122* 111* 118*  BUN '19 19 19 '$ 26* 27*  CREATININE 1.43* 1.13 1.03 1.26* 1.04  CALCIUM 8.6* 8.5* 8.7* 9.0 8.7*  PHOS  --  2.4*  --   --   --     Recent Labs  Lab 05/10/22 0005 05/11/22 1246  AST 27  --   ALT 20  --   ALKPHOS 93  --   BILITOT 0.4  --   PROT 7.0  --   ALBUMIN 4.1 3.5    Recent Labs  Lab 05/10/22 0005 05/10/22 0010 05/10/22 0545 05/11/22 1246 05/12/22 0631 05/14/22 0533 05/16/22 0625  WBC 9.2  --  11.6* 8.7 7.4 7.0 7.1  NEUTROABS 6.6  --  9.2*  --   --   --   --   HGB 16.7   < > 16.0 13.7 14.5 15.7 15.3  HCT 49.6   < > 46.7 39.4 43.0 44.1 44.2  MCV 98.4  --  97.3 97.3 98.2 95.7 96.1  PLT 141*  --  164 144* 146* 163 171   < > = values in this interval not displayed.    No results for input(s): "CKTOTAL", "CKMB", "CKMBINDEX", "TROPONINI" in the last 168 hours. No results for input(s): "LABPROT", "INR" in the last 72 hours.  No results for input(s): "COLORURINE", "LABSPEC", "PHURINE", "GLUCOSEU", "HGBUR", "BILIRUBINUR", "KETONESUR", "PROTEINUR", "UROBILINOGEN", "NITRITE", "LEUKOCYTESUR" in the last 72  hours.  Invalid input(s): "APPERANCEUR"     Component Value Date/Time   CHOL 134 05/10/2022 0545   TRIG 190 (H) 05/10/2022 0545   HDL 40 (L) 05/10/2022 0545   CHOLHDL 3.4 05/10/2022 0545   VLDL 38 05/10/2022 0545   LDLCALC 56 05/10/2022 0545   Lab Results  Component Value Date   HGBA1C 5.8 (H) 05/10/2022   No results found for: "LABOPIA", "COCAINSCRNUR", "LABBENZ", "AMPHETMU", "THCU", "LABBARB"  Recent Labs  Lab 05/10/22 0005  ETH <10     I have personally reviewed the radiological images below and agree with the radiology interpretations.  CT HEAD WO CONTRAST (5MM)  Result Date: 05/16/2022 CLINICAL DATA:  Stroke follow-up EXAM: CT HEAD WITHOUT CONTRAST TECHNIQUE: Contiguous axial images were obtained from the base of the skull through the vertex without intravenous contrast. RADIATION DOSE REDUCTION: This exam was performed according to the departmental dose-optimization program which includes automated exposure control, adjustment of the mA and/or kV according to patient size and/or use of iterative reconstruction technique. COMPARISON:  05/10/2022 FINDINGS: Brain: Known acute infarct in the posterior division of the right MCA territory with petechial hemorrhage. No hematoma  or malignant swelling. Chronic bilateral cerebellar and right thalamic infarcts. No hemorrhage, hydrocephalus, or masslike finding. Vascular: No new abnormality Skull: Negative Sinuses/Orbits: Negative Other: Partially covered right parotid mass measuring 15 mm. IMPRESSION: 1. No new abnormality. Large right posterior division MCA territory infarct with mild petechial hemorrhage. 2. Chronic cerebellar and thalamic small vessel infarcts. 3. 15 mm right parotid mass. Electronically Signed   By: Jorje Guild M.D.   On: 05/16/2022 07:03   DG Swallowing Func-Speech Pathology  Result Date: 05/11/2022 Table formatting from the original result was not included. Modified Barium Swallow Study Patient Details Name:  JOSUA MANRRIQUEZ MRN: [id number] Date of Birth: October 21, 1933 Today's Date: 05/11/2022 HPI/PMH: HPI: 89 year old male with atrial fibrillation (on Xarelto), prior stroke, presenting with acute onset of left hemiplegia and left hemineglect after sustaining a fall to the left while at home this evening. Underwent thrombectomy Clinical Impression: Clinical Impression: Pt demonstrates moderate to severe oral dysphagia witn left CN VII weakness and sensory impairment leading to severe anterior spillage with cup sips of liquids. Lingual, buccal and palatal residue present with most trials. Thin residue spills to pharynx post swallow. There is delayed swallow initaition with all liquids. No aspiration of thin occurred until pt attempted consecutive straw sips (PAS 7); however, there is higher risk with thin and severe anterior spillage. Nectar was better controlled orally and with a straw, which reduced oral residue and spillage. Pt unable to masticate solids without dentures and lingual manipulation of bolus very poor. Recommend purees and nectar thick liquids while pt learns strategies and improves awareness. Can quickly upgrade to thin as pt demonstrates awareness and control without severe coughing Factors that may increase risk of adverse event in presence of aspiration ([place] 2021): Factors that may increase risk of adverse event in presence of aspiration ([place] 2021): Limited mobility; Reduced cognitive function Recommendations/Plan: Swallowing Evaluation Recommendations Swallowing Evaluation Recommendations Liquid Administration via: Straw; Cup Medication Administration: Whole meds with puree Supervision: Full supervision/cueing for swallowing strategies Swallowing strategies  : Slow rate; Small bites/sips; Minimize environmental distractions; Check for anterior loss Postural changes: Stay upright 30-60 min after meals Treatment Plan Treatment Plan Treatment recommendations: Therapy as outlined in  treatment plan below Follow-up recommendations: Acute inpatient rehab (3 hours/day) Functional status assessment: Patient has had a recent decline in their functional status and demonstrates the ability to make significant improvements in function in a reasonable and predictable amount of time. Treatment frequency: Min 2x/week Treatment duration: 2 weeks Interventions: Aspiration precaution training; Diet toleration management by SLP; Trials of upgraded texture/liquids; Patient/family education; Compensatory techniques Recommendations Recommendations for follow up therapy are one component of a multi-disciplinary discharge planning process, led by the attending physician.  Recommendations may be updated based on patient status, additional functional criteria and insurance authorization. Assessment: Orofacial Exam: Orofacial Exam Oral Cavity - Dentition: Dentures, top; Dentures, bottom Anatomy: Anatomy: WFL Thin Liquids: Thin Liquids (Level 0) Thin Liquids : Impaired Bolus delivery method: Cup Thin Liquid - Impairment: Oral Impairment; Pharyngeal impairment Lip Closure: Escape beyond mid-chin Tongue control during bolus hold: Escape to lateral buccal cavity/floor of mouth Bolus transport/lingual motion: Repetitive/disorganized tongue motion Oral residue: Residue collection on oral structures Location of oral residue : Floor of mouth; Lateral sulci; Tongue Initiation of swallow : Pyriform sinuses Soft palate elevation: No bolus between soft palate (SP)/pharyngeal wall (PW) Laryngeal elevation: Complete superior movement of thyroid cartilage with complete approximation of arytenoids to epiglottic petiole Anterior hyoid excursion: Complete Epiglottic movement:  Complete Laryngeal vestibule closure: Complete, no air/contrast in laryngeal vestibule Pharyngeal stripping wave : Present - complete Pharyngeal contraction (A/P view only): N/A Pharyngoesophageal segment opening: Complete distension and complete duration, no  obstruction of flow Tongue base retraction: Trace column of contrast or air between tongue base and PPW Pharyngeal residue: Trace residue within or on pharyngeal structures Location of pharyngeal residue: Valleculae Penetration/Aspiration Scale (PAS) score: 7.  Material enters airway, passes BELOW cords and not ejected out despite cough attempt by patient; 1.  Material does not enter airway (aspiration with straw only)  Mildly Thick Liquids: Mildly thick liquids (Level 2, nectar thick) Mildly thick liquids (Level 2, nectar thick): Impaired Bolus delivery method: Cup Mildly Thick Liquid - Impairment: Oral Impairment; Pharyngeal impairment Lip Closure: Escape beyond mid-chin Tongue control during bolus hold: Escape to lateral buccal cavity/floor of mouth Bolus transport/lingual motion: Repetitive/disorganized tongue motion Oral residue: Residue collection on oral structures Location of oral residue : Lateral sulci; Floor of mouth Initiation of swallow : Pyriform sinuses Soft palate elevation: No bolus between soft palate (SP)/pharyngeal wall (PW) Laryngeal elevation: Complete superior movement of thyroid cartilage with complete approximation of arytenoids to epiglottic petiole Anterior hyoid excursion: Complete Epiglottic movement: Complete Laryngeal vestibule closure: Complete, no air/contrast in laryngeal vestibule Pharyngeal stripping wave : Present - complete Pharyngeal contraction (A/P view only): Complete; N/A Pharyngoesophageal segment opening: Complete distension and complete duration, no obstruction of flow Tongue base retraction: Trace column of contrast or air between tongue base and PPW Pharyngeal residue: Trace residue within or on pharyngeal structures Location of pharyngeal residue: Valleculae  Moderately Thick Liquids: Moderately thick liquids (Level 3, honey thick) Moderately thick liquids (Level 3, honey thick): Impaired Bolus delivery method: Spoon Moderately Thick Liquid - Impairment: Oral  Impairment Lip Closure: Escape from interlabial space or lateral juncture, no extension beyond vermillion border Tongue control during bolus hold: Escape to lateral buccal cavity/floor of mouth Bolus transport/lingual motion: Repetitive/disorganized tongue motion Oral residue: Residue collection on oral structures Location of oral residue : Floor of mouth; Lateral sulci Initiation of swallow : Posterior laryngeal surface of the epiglottis  Puree: Puree Puree: Impaired Puree - Impairment: Oral Impairment Lip Closure: Interlabial escape, no progression to anterior lip Bolus transport/lingual motion: Repetitive/disorganized tongue motion Oral residue: Trace residue lining oral structures Location of oral residue : Tongue Initiation of swallow: Valleculae Solid: Solid Solid: Impaired Solid - Impairment: Oral Impairment Lip Closure: Escape beyond mid-chin Bolus preparation/mastication: Minimal chewing/mashing with majority of bolus unchewed Bolus transport/lingual motion: Repetitive/disorganized tongue motion Oral residue: Majority of bolus remaining Location of oral residue : Lateral sulci Pill: Pill Pill: Not Tested Compensatory Strategies: Compensatory Strategies Compensatory strategies: Yes Straw: Effective Effective Straw: Mildly thick liquid (Level 2, nectar thick) (aspiration with thin)   General Information: Caregiver present: No  Diet Prior to this Study: NPO   No data recorded  No data recorded  Supplemental O2: None (Room air)   History of Recent Intubation: No  Behavior/Cognition: Alert; Cooperative; Pleasant mood No data recorded Baseline vocal quality/speech: Normal Volitional Cough: Able to elicit Volitional Swallow: Able to elicit No data recorded Goal Planning: Prognosis for improved oropharyngeal function: Good Barriers to Reach Goals: Cognitive deficits No data recorded No data recorded Consulted and agree with results and recommendations: Patient; Family member/caregiver Pain: Pain Assessment Pain  Assessment: Faces Faces Pain Scale: 0 Pain Intervention(s): Monitored during session End of Session: Start Time:SLP Start Time (ACUTE ONLY): [id number] Stop Time: SLP Stop Time (ACUTE ONLY): 1405 Time Calculation:SLP Time  Calculation (min) (ACUTE ONLY): 20 min Charges: SLP Evaluations $ SLP Speech Visit: 1 Visit SLP Evaluations $BSS Swallow: 1 Procedure $MBS Swallow: 1 Procedure $ SLP EVAL LANGUAGE/SOUND PRODUCTION: 1 Procedure $Swallowing Treatment: 1 Procedure $Speech Treatment for Individual: 1 Procedure SLP visit diagnosis: SLP Visit Diagnosis: Dysphagia, oropharyngeal phase (R13.12) Past Medical History: Past Medical History: Diagnosis Date  High cholesterol   05/2013; no defecits  Hyperlipemia   Left arm weakness   Left hand weakness   Personal history of colonic polyp - adenoma  10/23/2013  Stroke ([place]) 05/2013  MRI on 07/02/13 = Multifocal acute & subacute infarction involving right frontal MCA/ACA & left parietal MCA/PCA watershed areas Past Surgical History: Past Surgical History: Procedure Laterality Date  IR CT HEAD LTD  05/10/2022  IR PERCUTANEOUS ART THROMBECTOMY/INFUSION INTRACRANIAL INC DIAG ANGIO  05/10/2022  LOOP RECORDER IMPLANT N/A 10/31/2013  Procedure: LOOP RECORDER IMPLANT;  Surgeon: Coralyn Mark, MD;  Location: [facility] CATH [facility];  Service: Cardiovascular;  Laterality: N/A;  RADIOLOGY WITH ANESTHESIA N/A 05/10/2022  Procedure: IR WITH ANESTHESIA;  Surgeon: Luanne Bras, MD;  Location: [facility];  Service: Radiology;  Laterality: N/A;  SHOULDER SURGERY Bilateral 2003, 1995 [place], Katherene Ponto 05/11/2022, 2:20 PM  IR PERCUTANEOUS ART THROMBECTOMY/INFUSION INTRACRANIAL INC DIAG ANGIO  Result Date: 05/11/2022 INDICATION: Worsening left-sided weakness and right gaze deviation. Occluded distal M2 segment of the inferior division of the right middle cerebral artery on CT angiogram of the head and neck. EXAM: 1. EMERGENT LARGE VESSEL OCCLUSION THROMBOLYSIS anterior CIRCULATION) COMPARISON:  CT angiogram of the  head and neck of May 10, 2022. MEDICATIONS: Ancef 2 g IV antibiotic was administered within 1 hour of the procedure. ANESTHESIA/SEDATION: General anesthesia. CONTRAST:  Omnipaque 300 approximately 70 mL. FLUOROSCOPY TIME:  Fluoroscopy Time: 41 minutes 16 seconds (2313 mGy). COMPLICATIONS: None immediate. TECHNIQUE: Following a full explanation of the procedure along with the potential associated complications, an informed witnessed consent was obtained. The risks of intracranial hemorrhage of 10%, worsening neurological deficit, ventilator dependency, death and inability to revascularize were all reviewed in detail with the patient's spouse. The patient was then put under general anesthesia by the Department of Anesthesiology at [facility]. The right groin was prepped and draped in the usual sterile fashion. Thereafter using modified Seldinger technique, transfemoral access into the right common femoral artery was obtained without difficulty. Over a 0.035 inch guidewire an 8 French 25 cm Pinnacle sheath was inserted. Through this a combination of a 125 cm 5 [place] Berenstein support catheter inside of an 087 95 cm balloon guide catheter was advanced initially proximal to the right common carotid bifurcation and then into the distal right internal carotid artery. FINDINGS: The right common carotid bifurcation demonstrates the right external carotid artery and its major branches to be widely patent. The right internal carotid artery at the bulb demonstrates a smooth shallow plaque along the posterior wall associated with 10% stenosis by the NASCET criteria. Distal to this the right internal carotid artery demonstrates opacification to the cranial skull base. The petrous, the cavernous and the supraclinoid right ICA demonstrate wide patency. A right posterior communicating artery is seen opacifying the right posterior cerebral artery distribution. The right anterior cerebral artery opacifies into the  capillary and venous phases. The right middle cerebral artery M1 segment is widely patent. There is an occlusion of a parietal branch of the inferior division of the right middle cerebral artery in the distal M3 region. Delayed arterial and capillary phase demonstrates significant retrograde opacification  of the distal right parietal cortical and subcortical distribution from the anterior cerebral and the P3 leptomeningeal branches of the right posterior cerebral artery. PROCEDURE: Through the balloon guide catheter in the distal right internal carotid artery, a combination of an 045 Zoom aspiration catheter with a 160 cm Trevo microcatheter was advanced over an 014 inch soft tip micro guidewire with a moderate J configuration to the supraclinoid right ICA. The micro guidewire was then gently manipulated with a torque device and advanced without difficulty distal to the occluded branch of the inferior division into the M3 region followed by the microcatheter. A 3 mm x 20 mm Solitaire X retrieval device was then deployed following verification of safe positioning of the tip of the microcatheter. This was then followed by the advancement of the Zoom aspiration catheter which was engaged just inside the occluded portion of the vessel, the micro guidewire and the microcatheter were retrieved proximally. Following 2 minutes of contact aspiration with a Penumbra aspiration device and proximal flow arrest, the Zoom aspiration catheter was removed. Two approximately 1 mm pearly sticky clots were retrieved. A control arteriogram demonstrated no significant change in the occluded parietal branch. A second pass was then made using the above combination. The microcatheter was advanced past the occlusion into the M3 region with the micro guidewire. The guidewire was removed. Good aspiration obtained from the hub of the microcatheter which was then connected to continuous heparinized saline infusion. The Zoom aspiration catheter  was then engaged just inside the occluded segment of the vessel. A 3 mm x 20 mm Solitaire X retrieval device was then deployed in the usual manner. Thereafter, constant aspiration was applied with proximal flow arrest the hub of the Zoom aspiration catheter for a minute and a half. Following this the combination of the retrieval device, the microcatheter and the aspiration catheter were removed. A control arteriogram performed through the balloon guide catheter following reversal of flow arrest now demonstrated improved flow through the occluded branch though slow to the M3 M4 regions. A third pass was then made using a combination using an 035 inch aspiration catheter advanced over an 014 inch soft tip micro guidewire with a moderate J configuration to the right middle cerebral artery. Access into the now reoccluded inferior division M2 branch was then made with the micro guidewire followed by the microcatheter into the distal M3 region. The guidewire was removed. The 035 inch Zoom aspiration was then engaged into the clot with no aspiration obtained. Aspiration was then applied with a Penumbra aspiration device for approximately 2 minutes with proximal flow arrest. The aspiration catheter was removed. A control arteriogram performed through the balloon guide catheter demonstrated mild improvement in revascularization of the occluded branch with the clot noted slightly more distally. A fourth pass was then made this time again using an 045 Zoom aspiration catheter advanced with an 021 microcatheter and over 014 inch micro guidewire. This combination was then advanced into the M3 region of the occluded branch followed by the microcatheter. The guidewire was removed. Good aspiration obtained from the hub of the microcatheter which was positioned distally in the M3 segment. This was then connected to continuous heparinized saline infusion. A 4 mm x 40 mm Solitaire X retrieval stent was then deployed in the usual  manner with the 045 Zoom aspiration catheter imbedded in the occluded branch. Aspiration was then for approximately 2 minutes at the hub of the aspiration catheter with proximal flow arrest. Thereafter, the retriever, the microcatheter and  the Zoom aspiration catheter were removed. A control arteriogram performed following reversal of flow arrest in the right internal carotid artery, continued to demonstrate slow antegrade flow distal to the occluded segment. A control arteriogram performed through the balloon guide catheter continued to demonstrate a TICI 2C over revascularization with only mild distal flow past the occluded M2 branch of the right inferior division MCA. No further passes were made due to now steadily increased risk of vessel injury perforation. The balloon guide was retrieved in the right common carotid artery. A control arteriogram performed through this continued to demonstrate patency of right internal carotid artery proximally and distally including that of the right posterior communicating artery, the right anterior cerebral artery and the right middle cerebral artery distributions. An 8 French Angio-Seal closure device was then deployed in the right groin puncture site for hemostasis. Distal pulses remained Dopplerable in both feet unchanged from prior to the procedure. A flat panel CT of the brain demonstrated no evidence of intra cerebral hemorrhage. The patient's general anesthesia was reversed, and patient was extubated. Initially agitated, the patient gradually settled down to where simple commands appropriately and verbalized appropriately. The patient moved his left lower extremities spontaneously and to command with good strength. Slight movement was evident in the left upper extremity. The patient was then transferred to the neuro ICU for post revascularization management. IMPRESSION: Status post endovascular revascularization of occluded distal M2 branch of the right middle cerebral  artery inferior division with 2 passes with a 3 mm x 20 mm retrieval device and contact aspiration, 1 pass with a 4 mm x 40 mm Solitaire X retrieval device and contact aspiration, 1 pass with contact aspiration with minimal improvement in revascularization of the occluded right MCA inferior division M2 segment occlusion. A TICI 2C revascularization was maintained. PLAN: Follow-up as per referring MD. Electronically Signed   By: Luanne Bras M.D.   On: 05/11/2022 08:28   IR CT Head Ltd  Result Date: 05/11/2022 INDICATION: Worsening left-sided weakness and right gaze deviation. Occluded distal M2 segment of the inferior division of the right middle cerebral artery on CT angiogram of the head and neck. EXAM: 1. EMERGENT LARGE VESSEL OCCLUSION THROMBOLYSIS anterior CIRCULATION) COMPARISON:  CT angiogram of the head and neck of May 10, 2022. MEDICATIONS: Ancef 2 g IV antibiotic was administered within 1 hour of the procedure. ANESTHESIA/SEDATION: General anesthesia. CONTRAST:  Omnipaque 300 approximately 70 mL. FLUOROSCOPY TIME:  Fluoroscopy Time: 41 minutes 16 seconds (2313 mGy). COMPLICATIONS: None immediate. TECHNIQUE: Following a full explanation of the procedure along with the potential associated complications, an informed witnessed consent was obtained. The risks of intracranial hemorrhage of 10%, worsening neurological deficit, ventilator dependency, death and inability to revascularize were all reviewed in detail with the patient's spouse. The patient was then put under general anesthesia by the Department of Anesthesiology at [facility]. The right groin was prepped and draped in the usual sterile fashion. Thereafter using modified Seldinger technique, transfemoral access into the right common femoral artery was obtained without difficulty. Over a 0.035 inch guidewire an 8 French 25 cm Pinnacle sheath was inserted. Through this a combination of a 125 cm 5 [place] Berenstein support  catheter inside of an 087 95 cm balloon guide catheter was advanced initially proximal to the right common carotid bifurcation and then into the distal right internal carotid artery. FINDINGS: The right common carotid bifurcation demonstrates the right external carotid artery and its major branches to be widely patent. The right  internal carotid artery at the bulb demonstrates a smooth shallow plaque along the posterior wall associated with 10% stenosis by the NASCET criteria. Distal to this the right internal carotid artery demonstrates opacification to the cranial skull base. The petrous, the cavernous and the supraclinoid right ICA demonstrate wide patency. A right posterior communicating artery is seen opacifying the right posterior cerebral artery distribution. The right anterior cerebral artery opacifies into the capillary and venous phases. The right middle cerebral artery M1 segment is widely patent. There is an occlusion of a parietal branch of the inferior division of the right middle cerebral artery in the distal M3 region. Delayed arterial and capillary phase demonstrates significant retrograde opacification of the distal right parietal cortical and subcortical distribution from the anterior cerebral and the P3 leptomeningeal branches of the right posterior cerebral artery. PROCEDURE: Through the balloon guide catheter in the distal right internal carotid artery, a combination of an 045 Zoom aspiration catheter with a 160 cm Trevo microcatheter was advanced over an 014 inch soft tip micro guidewire with a moderate J configuration to the supraclinoid right ICA. The micro guidewire was then gently manipulated with a torque device and advanced without difficulty distal to the occluded branch of the inferior division into the M3 region followed by the microcatheter. A 3 mm x 20 mm Solitaire X retrieval device was then deployed following verification of safe positioning of the tip of the microcatheter. This  was then followed by the advancement of the Zoom aspiration catheter which was engaged just inside the occluded portion of the vessel, the micro guidewire and the microcatheter were retrieved proximally. Following 2 minutes of contact aspiration with a Penumbra aspiration device and proximal flow arrest, the Zoom aspiration catheter was removed. Two approximately 1 mm pearly sticky clots were retrieved. A control arteriogram demonstrated no significant change in the occluded parietal branch. A second pass was then made using the above combination. The microcatheter was advanced past the occlusion into the M3 region with the micro guidewire. The guidewire was removed. Good aspiration obtained from the hub of the microcatheter which was then connected to continuous heparinized saline infusion. The Zoom aspiration catheter was then engaged just inside the occluded segment of the vessel. A 3 mm x 20 mm Solitaire X retrieval device was then deployed in the usual manner. Thereafter, constant aspiration was applied with proximal flow arrest the hub of the Zoom aspiration catheter for a minute and a half. Following this the combination of the retrieval device, the microcatheter and the aspiration catheter were removed. A control arteriogram performed through the balloon guide catheter following reversal of flow arrest now demonstrated improved flow through the occluded branch though slow to the M3 M4 regions. A third pass was then made using a combination using an 035 inch aspiration catheter advanced over an 014 inch soft tip micro guidewire with a moderate J configuration to the right middle cerebral artery. Access into the now reoccluded inferior division M2 branch was then made with the micro guidewire followed by the microcatheter into the distal M3 region. The guidewire was removed. The 035 inch Zoom aspiration was then engaged into the clot with no aspiration obtained. Aspiration was then applied with a Penumbra  aspiration device for approximately 2 minutes with proximal flow arrest. The aspiration catheter was removed. A control arteriogram performed through the balloon guide catheter demonstrated mild improvement in revascularization of the occluded branch with the clot noted slightly more distally. A fourth pass was then made this  time again using an 045 Zoom aspiration catheter advanced with an 021 microcatheter and over 014 inch micro guidewire. This combination was then advanced into the M3 region of the occluded branch followed by the microcatheter. The guidewire was removed. Good aspiration obtained from the hub of the microcatheter which was positioned distally in the M3 segment. This was then connected to continuous heparinized saline infusion. A 4 mm x 40 mm Solitaire X retrieval stent was then deployed in the usual manner with the 045 Zoom aspiration catheter imbedded in the occluded branch. Aspiration was then for approximately 2 minutes at the hub of the aspiration catheter with proximal flow arrest. Thereafter, the retriever, the microcatheter and the Zoom aspiration catheter were removed. A control arteriogram performed following reversal of flow arrest in the right internal carotid artery, continued to demonstrate slow antegrade flow distal to the occluded segment. A control arteriogram performed through the balloon guide catheter continued to demonstrate a TICI 2C over revascularization with only mild distal flow past the occluded M2 branch of the right inferior division MCA. No further passes were made due to now steadily increased risk of vessel injury perforation. The balloon guide was retrieved in the right common carotid artery. A control arteriogram performed through this continued to demonstrate patency of right internal carotid artery proximally and distally including that of the right posterior communicating artery, the right anterior cerebral artery and the right middle cerebral artery  distributions. An 8 French Angio-Seal closure device was then deployed in the right groin puncture site for hemostasis. Distal pulses remained Dopplerable in both feet unchanged from prior to the procedure. A flat panel CT of the brain demonstrated no evidence of intra cerebral hemorrhage. The patient's general anesthesia was reversed, and patient was extubated. Initially agitated, the patient gradually settled down to where simple commands appropriately and verbalized appropriately. The patient moved his left lower extremities spontaneously and to command with good strength. Slight movement was evident in the left upper extremity. The patient was then transferred to the neuro ICU for post revascularization management. IMPRESSION: Status post endovascular revascularization of occluded distal M2 branch of the right middle cerebral artery inferior division with 2 passes with a 3 mm x 20 mm retrieval device and contact aspiration, 1 pass with a 4 mm x 40 mm Solitaire X retrieval device and contact aspiration, 1 pass with contact aspiration with minimal improvement in revascularization of the occluded right MCA inferior division M2 segment occlusion. A TICI 2C revascularization was maintained. PLAN: Follow-up as per referring MD. Electronically Signed   By: Luanne Bras M.D.   On: 05/11/2022 08:28   MR BRAIN WO CONTRAST  Result Date: 05/10/2022 CLINICAL DATA:  Stroke, follow up EXAM: MRI HEAD WITHOUT CONTRAST MRA HEAD WITHOUT CONTRAST TECHNIQUE: Multiplanar, multi-echo pulse sequences of the brain and surrounding structures were acquired without intravenous contrast. Angiographic images of the Circle of Willis were acquired using MRA technique without intravenous contrast. COMPARISON:  CTA and CT head from same day. FINDINGS: MRI HEAD FINDINGS Motion limited study. Brain: Acute posterior right MCA territory infarct. Associated edema and regional mass effect without significant midline shift. No mass occupying  acute hemorrhage. No mass lesion. No hydrocephalus. Remote infarct in the right cerebellum. Vascular: Detailed below. Skull and upper cervical spine: Normal marrow signal. Sinuses/Orbits: Clear sinuses.  No acute orbital findings. MRA HEAD FINDINGS Severely motion limited study. Anterior circulation: Bilateral intracranial ICAs, proximal ACAs and proximal M1 MCAs appear grossly patent. Essentially nondiagnostic evaluation of the M2  MCAs, including the region of occlusion seen on recent CTA. Posterior circulation: The intradural vertebral arteries, basilar arteries and proximal PCAs are grossly patent. IMPRESSION: 1. Acute right posterior MCA territory infarct. Associated edema without midline shift. 2. Severely motion limited MRA with nondiagnostic evaluation of the right M2 MCA in the region of recently seen occlusion on CTA. A repeat exclude exam may be able to better assess if the patient is able in clinically warranted. Electronically Signed   By: Margaretha Sheffield M.D.   On: 05/10/2022 16:03   MR ANGIO HEAD WO CONTRAST  Result Date: 05/10/2022 CLINICAL DATA:  Stroke, follow up EXAM: MRI HEAD WITHOUT CONTRAST MRA HEAD WITHOUT CONTRAST TECHNIQUE: Multiplanar, multi-echo pulse sequences of the brain and surrounding structures were acquired without intravenous contrast. Angiographic images of the Circle of Willis were acquired using MRA technique without intravenous contrast. COMPARISON:  CTA and CT head from same day. FINDINGS: MRI HEAD FINDINGS Motion limited study. Brain: Acute posterior right MCA territory infarct. Associated edema and regional mass effect without significant midline shift. No mass occupying acute hemorrhage. No mass lesion. No hydrocephalus. Remote infarct in the right cerebellum. Vascular: Detailed below. Skull and upper cervical spine: Normal marrow signal. Sinuses/Orbits: Clear sinuses.  No acute orbital findings. MRA HEAD FINDINGS Severely motion limited study. Anterior circulation:  Bilateral intracranial ICAs, proximal ACAs and proximal M1 MCAs appear grossly patent. Essentially nondiagnostic evaluation of the M2 MCAs, including the region of occlusion seen on recent CTA. Posterior circulation: The intradural vertebral arteries, basilar arteries and proximal PCAs are grossly patent. IMPRESSION: 1. Acute right posterior MCA territory infarct. Associated edema without midline shift. 2. Severely motion limited MRA with nondiagnostic evaluation of the right M2 MCA in the region of recently seen occlusion on CTA. A repeat exclude exam may be able to better assess if the patient is able in clinically warranted. Electronically Signed   By: Margaretha Sheffield M.D.   On: 05/10/2022 16:03   ECHOCARDIOGRAM COMPLETE  Result Date: 05/10/2022    ECHOCARDIOGRAM REPORT   Patient Name:   DANILE GRONEMEYER Date of Exam: 05/10/2022 Medical Rec #:  [id number]     Height:       71.5 in Accession #:    [id number]    Weight:       223.1 lb Date of Birth:  [birth year]    BSA:          2.220 m Patient Age:    53 years      BP:           160/90 mmHg Patient Gender: M             HR:           73 bpm. Exam Location:  Inpatient Procedure: 2D Echo, Cardiac Doppler and Color Doppler Indications:    Stroke I63.9  History:        Patient has prior history of Echocardiogram examinations and                 Patient has no prior history of Echocardiogram examinations.                 Pacemaker, Stroke, Arrythmias:PVC and Tachycardia; Risk                 Factors:Dyslipidemia.  Sonographer:    Ronny Flurry Referring Phys: [phone] ERIC LINDZEN  Sonographer Comments: Image acquisition challenging due to respiratory motion. IMPRESSIONS  1. Left ventricular ejection fraction, by estimation, is  60 to 65%. The left ventricle has normal function. The left ventricle has no regional wall motion abnormalities. Left ventricular diastolic parameters are consistent with Grade II diastolic dysfunction (pseudonormalization).  2. Right  ventricular systolic function is normal. The right ventricular size is normal. There is normal pulmonary artery systolic pressure.  3. The mitral valve is normal in structure. No evidence of mitral valve regurgitation. No evidence of mitral stenosis.  4. The aortic valve is calcified. Aortic valve regurgitation is trivial. Mild aortic valve stenosis. Aortic valve area, by VTI measures 1.55 cm. Aortic valve mean gradient measures 19.0 mmHg. Aortic valve Vmax measures 2.94 m/s.  5. The inferior vena cava is normal in size with greater than 50% respiratory variability, suggesting right atrial pressure of 3 mmHg. FINDINGS  Left Ventricle: Left ventricular ejection fraction, by estimation, is 60 to 65%. The left ventricle has normal function. The left ventricle has no regional wall motion abnormalities. The left ventricular internal cavity size was normal in size. There is  no left ventricular hypertrophy. Left ventricular diastolic parameters are consistent with Grade II diastolic dysfunction (pseudonormalization). Indeterminate filling pressures. Right Ventricle: The right ventricular size is normal. No increase in right ventricular wall thickness. Right ventricular systolic function is normal. There is normal pulmonary artery systolic pressure. The tricuspid regurgitant velocity is 1.45 m/s, and  with an assumed right atrial pressure of 3 mmHg, the estimated right ventricular systolic pressure is [id number] mmHg. Left Atrium: Left atrial size was normal in size. Right Atrium: Right atrial size was normal in size. Pericardium: There is no evidence of pericardial effusion. Mitral Valve: The mitral valve is normal in structure. No evidence of mitral valve regurgitation. No evidence of mitral valve stenosis. Tricuspid Valve: The tricuspid valve is normal in structure. Tricuspid valve regurgitation is trivial. No evidence of tricuspid stenosis. Aortic Valve: The aortic valve is calcified. Aortic valve regurgitation is trivial.  Mild aortic stenosis is present. Aortic valve mean gradient measures 19.0 mmHg. Aortic valve peak gradient measures 34.5 mmHg. Aortic valve area, by VTI measures 1.55 cm. Pulmonic Valve: The pulmonic valve was normal in structure. Pulmonic valve regurgitation is not visualized. No evidence of pulmonic stenosis. Aorta: The aortic root is normal in size and structure. Venous: The inferior vena cava is normal in size with greater than 50% respiratory variability, suggesting right atrial pressure of 3 mmHg. IAS/Shunts: No atrial level shunt detected by color flow Doppler.  LEFT VENTRICLE PLAX 2D LVIDd:         4.60 cm   Diastology LVIDs:         3.70 cm   LV e' medial:    7.80 cm/s LV PW:         1.30 cm   LV E/e' medial:  10.4 LV IVS:        1.20 cm   LV e' lateral:   11.60 cm/s LVOT diam:     2.20 cm   LV E/e' lateral: 7.0 LV SV:         89 LV SV Index:   40 LVOT Area:     3.80 cm  RIGHT VENTRICLE             IVC RV S prime:     16.60 cm/s  IVC diam: 2.10 cm TAPSE (M-mode): 1.5 cm LEFT ATRIUM             Index        RIGHT ATRIUM  Index LA diam:        4.60 cm 2.07 cm/m   RA Area:     15.10 cm LA Vol (A2C):   64.7 ml 29.14 ml/m  RA Volume:   31.80 ml  14.32 ml/m LA Vol (A4C):   58.7 ml 26.44 ml/m LA Biplane Vol: 62.9 ml 28.33 ml/m  AORTIC VALVE AV Area (Vmax):    1.61 cm AV Area (Vmean):   1.68 cm AV Area (VTI):     1.55 cm AV Vmax:           293.50 cm/s AV Vmean:          189.800 cm/s AV VTI:            0.570 m AV Peak Grad:      34.5 mmHg AV Mean Grad:      19.0 mmHg LVOT Vmax:         124.00 cm/s LVOT Vmean:        83.833 cm/s LVOT VTI:          0.233 m LVOT/AV VTI ratio: 0.41  AORTA Ao Root diam: 3.30 cm MITRAL VALVE               TRICUSPID VALVE MV Area (PHT): 4.68 cm    TR Peak grad:   8.4 mmHg MV Decel Time: 162 msec    TR Vmax:        145.00 cm/s MV E velocity: 81.00 cm/s MV A velocity: 65.50 cm/s  SHUNTS MV E/A ratio:  1.24        Systemic VTI:  0.23 m                            Systemic  Diam: 2.20 cm Skeet Latch MD Electronically signed by Skeet Latch MD Signature Date/Time: 05/10/2022/10:48:39 AM    Final    CT HEAD CODE STROKE WO CONTRAST  Result Date: 05/10/2022 CLINICAL DATA:  Acute neurologic deficit EXAM: CT HEAD WITHOUT CONTRAST CT CERVICAL SPINE WITHOUT CONTRAST TECHNIQUE: Multidetector CT imaging of the head and cervical spine was performed following the standard protocol without intravenous contrast. Multiplanar CT image reconstructions of the cervical spine were also generated. RADIATION DOSE REDUCTION: This exam was performed according to the departmental dose-optimization program which includes automated exposure control, adjustment of the mA and/or kV according to patient size and/or use of iterative reconstruction technique. COMPARISON:  None Available. FINDINGS: CT HEAD FINDINGS Brain: There is no mass, hemorrhage or extra-axial collection. Generalized atrophy. Multiple old small vessel infarcts of the cerebellum. There is hypoattenuation of the periventricular white matter, most commonly indicating chronic ischemic microangiopathy. Old right frontal and parietal infarcts. Vascular: No abnormal hyperdensity of the major intracranial arteries or dural venous sinuses. No intracranial atherosclerosis. Skull: The visualized skull base, calvarium and extracranial soft tissues are normal. Sinuses/Orbits: No fluid levels or advanced mucosal thickening of the visualized paranasal sinuses. No mastoid or middle ear effusion. The orbits are normal. ASPECTS ([place] Stroke Program Early CT Score) - Ganglionic level infarction (caudate, lentiform nuclei, internal capsule, insula, M1-M3 cortex): 7 - Supraganglionic infarction (M4-M6 cortex): 3 Total score (0-10 with 10 being normal): 10 CT CERVICAL SPINE FINDINGS Alignment: No static subluxation. Facets are aligned. Occipital condyles and the lateral masses of C1 and C2 are normally approximated. Skull base and vertebrae: No acute  fracture. Soft tissues and spinal canal: No prevertebral fluid or swelling. No visible canal hematoma. Disc levels: No advanced  spinal canal or neural foraminal stenosis. Upper chest: No pneumothorax, pulmonary nodule or pleural effusion. Other: Normal visualized paraspinal cervical soft tissues. IMPRESSION: 1. No acute intracranial abnormality. 2. ASPECTS is 10. 3. No acute fracture or static subluxation of the cervical spine. These results were communicated to Dr. Kerney Elbe at 12:24 am on 05/10/2022 by text page via the [facility] messaging system. Electronically Signed   By: Ulyses Jarred M.D.   On: 05/10/2022 00:47   CT C-SPINE NO CHARGE  Result Date: 05/10/2022 CLINICAL DATA:  Acute neurologic deficit EXAM: CT HEAD WITHOUT CONTRAST CT CERVICAL SPINE WITHOUT CONTRAST TECHNIQUE: Multidetector CT imaging of the head and cervical spine was performed following the standard protocol without intravenous contrast. Multiplanar CT image reconstructions of the cervical spine were also generated. RADIATION DOSE REDUCTION: This exam was performed according to the departmental dose-optimization program which includes automated exposure control, adjustment of the mA and/or kV according to patient size and/or use of iterative reconstruction technique. COMPARISON:  None Available. FINDINGS: CT HEAD FINDINGS Brain: There is no mass, hemorrhage or extra-axial collection. Generalized atrophy. Multiple old small vessel infarcts of the cerebellum. There is hypoattenuation of the periventricular white matter, most commonly indicating chronic ischemic microangiopathy. Old right frontal and parietal infarcts. Vascular: No abnormal hyperdensity of the major intracranial arteries or dural venous sinuses. No intracranial atherosclerosis. Skull: The visualized skull base, calvarium and extracranial soft tissues are normal. Sinuses/Orbits: No fluid levels or advanced mucosal thickening of the visualized paranasal sinuses. No mastoid or  middle ear effusion. The orbits are normal. ASPECTS ([place] Stroke Program Early CT Score) - Ganglionic level infarction (caudate, lentiform nuclei, internal capsule, insula, M1-M3 cortex): 7 - Supraganglionic infarction (M4-M6 cortex): 3 Total score (0-10 with 10 being normal): 10 CT CERVICAL SPINE FINDINGS Alignment: No static subluxation. Facets are aligned. Occipital condyles and the lateral masses of C1 and C2 are normally approximated. Skull base and vertebrae: No acute fracture. Soft tissues and spinal canal: No prevertebral fluid or swelling. No visible canal hematoma. Disc levels: No advanced spinal canal or neural foraminal stenosis. Upper chest: No pneumothorax, pulmonary nodule or pleural effusion. Other: Normal visualized paraspinal cervical soft tissues. IMPRESSION: 1. No acute intracranial abnormality. 2. ASPECTS is 10. 3. No acute fracture or static subluxation of the cervical spine. These results were communicated to Dr. Kerney Elbe at 12:24 am on 05/10/2022 by text page via the [facility] messaging system. Electronically Signed   By: Ulyses Jarred M.D.   On: 05/10/2022 00:47   CT ANGIO HEAD NECK W WO CM (CODE STROKE)  Result Date: 05/10/2022 CLINICAL DATA:  Acute neurologic deficit EXAM: CT ANGIOGRAPHY HEAD AND NECK TECHNIQUE: Multidetector CT imaging of the head and neck was performed using the standard protocol during bolus administration of intravenous contrast. Multiplanar CT image reconstructions and MIPs were obtained to evaluate the vascular anatomy. Carotid stenosis measurements (when applicable) are obtained utilizing NASCET criteria, using the distal internal carotid diameter as the denominator. RADIATION DOSE REDUCTION: This exam was performed according to the departmental dose-optimization program which includes automated exposure control, adjustment of the mA and/or kV according to patient size and/or use of iterative reconstruction technique. COMPARISON:  None Available. FINDINGS: CTA  NECK FINDINGS SKELETON: There is no bony spinal canal stenosis. No lytic or blastic lesion. OTHER NECK: 12 mm right parotid nodule (series 7 image 81). UPPER CHEST: No pneumothorax or pleural effusion. No nodules or masses. AORTIC ARCH: There is calcific atherosclerosis of the aortic arch. There is no aneurysm,  dissection or hemodynamically significant stenosis of the visualized portion of the aorta. Normal variant aortic arch branching pattern with the left vertebral artery arising independently from the aortic arch. The visualized proximal subclavian arteries are widely patent. RIGHT CAROTID SYSTEM: Atherosclerotic web at the bifurcation. Mixed density plaque with less than 50% stenosis. Widely patent ICA. LEFT CAROTID SYSTEM: Calcific atherosclerosis at the bifurcation without hemodynamically significant stenosis. VERTEBRAL ARTERIES: Right dominant configuration. Both origins are clearly patent. There is no dissection, occlusion or flow-limiting stenosis to the skull base (V1-V3 segments). CTA HEAD FINDINGS POSTERIOR CIRCULATION: --Vertebral arteries: Right V4 segment atherosclerosis with widely maintained patency. --Inferior cerebellar arteries: Normal. --Basilar artery: Normal. --Superior cerebellar arteries: Normal. --Posterior cerebral arteries (PCA): Normal. ANTERIOR CIRCULATION: --Intracranial internal carotid arteries: Atherosclerotic calcification of the internal carotid arteries at the skull base without hemodynamically significant stenosis. --Anterior cerebral arteries (ACA): Normal. Both A1 segments are present. Patent anterior communicating artery (a-comm). --Middle cerebral arteries (MCA): There is occlusion of the proximal M2 segment inferior division of the right MCA (series 10, image 114). Left MCA is normal. VENOUS SINUSES: As permitted by contrast timing, patent. ANATOMIC VARIANTS: Fetal origin of the right posterior cerebral artery. Review of the MIP images confirms the above findings.  IMPRESSION: 1. Occlusion of the proximal M2 segment inferior division of the right MCA. 2. 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 3. Nonspecific 12 mm right parotid nodule. 4. Critical Value/emergent results were called by telephone at the time of interpretation on 05/10/2022 at 12:38 am to provider ERIC [facility] , who verbally acknowledged these results. By Electronically Signed   By: Ulyses Jarred M.D.   On: 05/10/2022 00:39     PHYSICAL EXAM  Temp:  [97.5 F (36.4 C)-99.2 F (37.3 C)] 98.9 F (37.2 C) (02/27 0822) Pulse Rate:  [72-86] 72 (02/27 0822) Resp:  [16-20] 18 (02/27 0822) BP: (119-143)/(56-81) 143/81 (02/27 0822) SpO2:  [93 %-97 %] 97 % (02/27 0822)  General - Well nourished, well developed, in no apparent distress.  Ophthalmologic - fundi not visualized due to noncooperation.  Cardiovascular - irregularly irregular heart rate and rhythm. (Baseline diagnosis)  Neuro - awake, alert, eyes open, orientated to name, place, time. No aphasia but moderate dysarthria, following all simple commands.  Able to name and repeat with dysarthric voice. Increased responsiveness and attention.  Right gaze preference but does cross midline with tracking. Left visual field cut, no blink to threat, neglect.   Left facial droop. Tongue protrusion midline.  Motor: LUE deltoid, bicep and tricep 3/5, finger grip 2/5.Improved control of movement. Left LE proximal 3/5, distal 4/5, lifts against gravity. RUE 5/5 and RLE 5/5.   Left sensory decreased, but improved from yesterday's exam.   Gait not tested.   ASSESSMENT/PLAN Mr. WILMORE NETHERY is a 89 y.o. male with history of A-fib on Xarelto, hyperlipidemia, stroke in 05/2013 admitted for left-sided weakness and he fell at home. No tPA given due to outside window.    Stroke:  right large MCA infarct due to right M2 occlusion status post IR with  [id number], embolic secondary to A-fib given on Xarelto  CT no  acute abnormality Repeat CT 2/26 No new abnormality. Large right posterior division MCA territory infarct with mild petechial hemorrhage. Chronic cerebellar and thalamic small vessel infarcts. 15 mm right parotid mass CTA head and neck right M2 proximal occlusion, right ICA bulb atherosclerosis IR with right M2 distal occlusion. S/p 4 passes, TICI2c  MRI large right  MCA infarct MRA persistent right MCA occlusion although motion degraded 2D Echo EF 60 to 65% LDL 56 HgbA1c 5.8 VTE prophylaxis: eliquis Xarelto (rivaroxaban) daily prior to admission, now on eliquis   Ongoing aggressive stroke risk factor management Therapy recommendations: CIR Disposition: Pending SNF placement as daughter will be unable to care for patient after rehab. Daughter is hoping to find a facility where both patient and his wife can be together.   History of stroke 05/2013 admitted for left-sided weakness and imbalance.  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  MRA/TTE/carotid Doppler negative.  Discharged on aspirin and Zocor. Outpatient 30-day CardioNet monitoring negative and loop recorder placed.  Found to have A-fib in 12/2014, started on Xarelto.  Mild left sided residual at baseline.  Chronic A-fib On Xarelto at home Currently [facility] on hold given large size of infarct Now on Eliquis   Hypertension Home meds: toprol 12.5 Stable on the high end BP goal < 180/105 Off cleviprex Put on amlodipine '10mg'$  and increase home toprol from 12.5 to 25 Long term BP goal normotensive  Hyperlipidemia Home meds: Zocor 40 LDL 56, goal < 70 Now on lipitor 20 (zocor 40 equivalent, as amlodipine can increase the risk of rhabdo when given with Zocor )  No high intensity statin given LDL at goal Continue statin at discharge  Dysphagia  Pass swallow On dys 1  puree and nectar thick liquid IVF added back d/t increased creatinine Encourage po intake  Other Stroke Risk Factors Advanced  age Obesity, Body mass index is 30.68 kg/m.   Other Active Problems CKD 3A, creatinine 1.30-1.43-1.13-1.03 - 1.26 - 1.04 Will reduce IVF, encourage PO Hypokalemia: 3.7, replace and recheck in AM  Mild leukocytosis, resolved: WBC 11.6-8.7-7.4 - 7.0 - 7.1 Colace and PRN Dulcolax added to avoid constipation Insomnia - Ambien stopped. Trazodone PRN started, received 2/25 pm only.   Hospital day # 6  Pt seen by Neuro NP/APP and later by MD. Note/plan to be edited by MD as needed.    Otelia Santee, DNP, AGACNP-BC [facility] Please use AMION for pager and EPIC for messaging   05/16/2022 11:03 AM  I have personally obtained history,examined this patient, reviewed notes, independently viewed imaging studies, participated in medical decision making and plan of care.ROS completed by me personally and pertinent positives fully documented  I have made any additions or clarifications directly to the above note. Agree with note above.  CT head shows no hemorrhage or any acute changes and expected evolutionary changes of recent stroke.  Long discussion with patient and daughter at the bedside and answered questions.  Likely discharge to rehab t  in the next few days after insurance approval and bed availability.  Greater than 50% time during this 35-minute visit was spent in counseling and coordination of care about his embolic stroke and discussion of risk benefits of anticoagulation and atrial fibrillation and answering questions.  Antony Contras, MD Medical Director [facility] Pager: [phone] 05/16/2022 2:16 PM   To contact Stroke Continuity provider, please refer to [email or web address] After hours, contact General Neurology

## 2022-05-16 NOTE — Progress Notes (Signed)
Initial Nutrition Assessment  DOCUMENTATION CODES:  Not applicable  INTERVENTION:  Continue current diet per SLP Ensure Enlive po TID, each supplement provides 350 kcal and 20 grams of protein. Magic cup TID with meals, each supplement provides 290 kcal and 9 grams of protein  NUTRITION DIAGNOSIS:   Inadequate oral intake related to decreased appetite as evidenced by per patient/family report.  GOAL:   Patient will meet greater than or equal to 90% of their needs  MONITOR:   PO intake, Supplement acceptance, I & O's, Weight trends, Labs  REASON FOR ASSESSMENT:   Consult Assessment of nutrition requirement/status  ASSESSMENT:   Pt with hx of HLD, PAF and prior CVA presented to ED after a fall at home due to left sided weakness. Imaging in ED showed a right MCA infarct due to right M2 occlusion  2/21 - SLP bedside evaluation, NPO 2/22 - MBS, recommend purees and nectar thick liquids   Pt resting in bed at the time of assessment. Family at bedside.   Pt reports that intake has been pretty good this admission, but not eating at his baseline (family attribute this to the change in texture.) Well nourished on exam. Family states he is consuming ~ 50% of his meals and doing well with consuming liquids. Pt has dentures but they do not fit well so he typically doesn't use them.   Family reports pt had ensure yesterday and it was well accepted. Will scheduled to be delivered between meals and add magic cup with meals to augment intake.  Average Meal Intake: 2/22-2/26: 75% intake x 3 recorded meals  Nutritionally Relevant Medications: Scheduled Meds:  atorvastatin  20 mg Oral Daily   Continuous Infusions:  sodium chloride 75 mL/hr at 05/16/22 0404   sodium chloride 0.9 % 25 mL/hr at 05/14/22 1635   PRN Meds: senna-docusate  Labs Reviewed  NUTRITION - FOCUSED PHYSICAL EXAM: Flowsheet Row Most Recent Value  Orbital Region No depletion  Upper Arm Region No depletion   Thoracic and Lumbar Region No depletion  Buccal Region No depletion  Temple Region Mild depletion  Clavicle Bone Region No depletion  Clavicle and Acromion Bone Region No depletion  Scapular Bone Region No depletion  Dorsal Hand Mild depletion  Patellar Region No depletion  Anterior Thigh Region No depletion  Posterior Calf Region No depletion  Edema (RD Assessment) None  Hair Reviewed  Eyes Reviewed  Mouth Reviewed  Skin Reviewed  Nails Reviewed   Diet Order:   Diet Order             DIET - DYS 1 Room service appropriate? Yes with Assist; Fluid consistency: Nectar Thick  Diet effective now                   EDUCATION NEEDS:   Education needs have been addressed  Skin:  Skin Assessment: Reviewed RN Assessment  Last BM:  2/26 - type 6  Height:   Ht Readings from Last 1 Encounters:  05/10/22 5' 11.5" (1.816 m)    Weight:   Wt Readings from Last 1 Encounters:  05/10/22 101.2 kg    Ideal Body Weight:  80.9 kg  BMI:  Body mass index is 30.68 kg/m.  Estimated Nutritional Needs:  Kcal:  1900-2100 kcal/d Protein:  95-110g/d Fluid:  2L/d    Ranell Patrick, RD, LDN Clinical Dietitian RD pager # available in AMION  After hours/weekend pager # available in [facility]

## 2022-05-16 NOTE — Telephone Encounter (Signed)
Pharmacy Patient Advocate Encounter  Insurance verification completed.    The patient is insured through [employer]   The patient is currently admitted and ran test claims for the following: Eliquis.  Copays and coinsurance results were relayed to Inpatient clinical team.

## 2022-05-16 NOTE — Progress Notes (Signed)
Almost sliding out of chair, unable to hold himself in an upright position. Transferred back to bed x 2 assist, using hoyer lift.

## 2022-05-16 NOTE — TOC Benefit Eligibility Note (Signed)
Patient Teacher, English as a foreign language completed.    The patient is currently admitted and upon discharge could be taking Eliquis.  The current 30 day co-pay is $47.00.   The patient is insured through [place]

## 2022-05-16 NOTE — Discharge Summary (Incomplete)
Stroke Discharge Summary  Patient ID: Edward Maynard   MRN: [id number]      DOB: 02/04/1934  Date of Admission: 05/10/2022 Date of Discharge: 05/16/2022  Attending Physician:  Stroke, Md, MD, Stroke MD Consultant(s):    {consultation:[id number]} *** Patient's PCP:  Gaynelle Arabian, MD  DISCHARGE DIAGNOSIS: *** Principal Problem:   Stroke (cerebrum) [facility]) Active Problems:   Middle cerebral artery embolism, right   Chronic a-fib ([place])   Essential hypertension   Obesity   Advanced age   Dysphagia   Allergies as of 05/16/2022   No Known Allergies   Med Rec must be completed prior to using this [facility]***       LABORATORY STUDIES CBC    Component Value Date/Time   WBC 7.1 05/16/2022 0625   RBC 4.60 05/16/2022 0625   HGB 15.3 05/16/2022 0625   HGB 15.4 09/12/2021 1539   HCT 44.2 05/16/2022 0625   HCT 45.0 09/12/2021 1539   PLT 171 05/16/2022 0625   PLT 160 09/12/2021 1539   MCV 96.1 05/16/2022 0625   MCV 97 09/12/2021 1539   MCH 33.3 05/16/2022 0625   MCHC 34.6 05/16/2022 0625   RDW 13.5 05/16/2022 0625   RDW 12.8 09/12/2021 1539   LYMPHSABS 1.2 05/10/2022 0545   MONOABS 1.0 05/10/2022 0545   EOSABS 0.0 05/10/2022 0545   BASOSABS 0.0 05/10/2022 0545   CMP    Component Value Date/Time   NA 138 05/16/2022 0625   NA 142 09/12/2021 1539   K 3.7 05/16/2022 0625   CL 107 05/16/2022 0625   CO2 21 (L) 05/16/2022 0625   GLUCOSE 118 (H) 05/16/2022 0625   BUN 27 (H) 05/16/2022 0625   BUN 20 09/12/2021 1539   CREATININE 1.04 05/16/2022 0625   CALCIUM 8.7 (L) 05/16/2022 0625   PROT 7.0 05/10/2022 0005   PROT 6.9 10/01/2013 1534   ALBUMIN 3.5 05/11/2022 1246   ALBUMIN 4.4 10/01/2013 1534   AST 27 05/10/2022 0005   ALT 20 05/10/2022 0005   ALKPHOS 93 05/10/2022 0005   BILITOT 0.4 05/10/2022 0005   GFRNONAA >60 05/16/2022 0625   GFRAA 65 04/04/2018 0838   COAGS Lab Results  Component Value Date   INR 2.2 (H) 05/10/2022   INR 1.05 07/04/2013   Lipid  Panel    Component Value Date/Time   CHOL 134 05/10/2022 0545   TRIG 190 (H) 05/10/2022 0545   HDL 40 (L) 05/10/2022 0545   CHOLHDL 3.4 05/10/2022 0545   VLDL 38 05/10/2022 0545   LDLCALC 56 05/10/2022 0545   HgbA1C  Lab Results  Component Value Date   HGBA1C 5.8 (H) 05/10/2022   Urinalysis No results found for: "COLORURINE", "APPEARANCEUR", "LABSPEC", "PHURINE", "GLUCOSEU", "HGBUR", "BILIRUBINUR", "KETONESUR", "PROTEINUR", "UROBILINOGEN", "NITRITE", "LEUKOCYTESUR" Urine Drug Screen No results found for: "LABOPIA", "COCAINSCRNUR", "LABBENZ", "AMPHETMU", "THCU", "LABBARB"  Alcohol Level    Component Value Date/Time   ETH <10 05/10/2022 0005     SIGNIFICANT DIAGNOSTIC STUDIES CT HEAD WO CONTRAST (5MM)  Result Date: 05/16/2022 CLINICAL DATA:  Stroke follow-up EXAM: CT HEAD WITHOUT CONTRAST TECHNIQUE: Contiguous axial images were obtained from the base of the skull through the vertex without intravenous contrast. RADIATION DOSE REDUCTION: This exam was performed according to the departmental dose-optimization program which includes automated exposure control, adjustment of the mA and/or kV according to patient size and/or use of iterative reconstruction technique. COMPARISON:  05/10/2022 FINDINGS: Brain: Known acute infarct in the posterior division of the right MCA territory with  petechial hemorrhage. No hematoma or malignant swelling. Chronic bilateral cerebellar and right thalamic infarcts. No hemorrhage, hydrocephalus, or masslike finding. Vascular: No new abnormality Skull: Negative Sinuses/Orbits: Negative Other: Partially covered right parotid mass measuring 15 mm. IMPRESSION: 1. No new abnormality. Large right posterior division MCA territory infarct with mild petechial hemorrhage. 2. Chronic cerebellar and thalamic small vessel infarcts. 3. 15 mm right parotid mass. Electronically Signed   By: Jorje Guild M.D.   On: 05/16/2022 07:03   DG Swallowing Func-Speech  Pathology  Result Date: 05/11/2022 Table formatting from the original result was not included. Modified Barium Swallow Study Patient Details Name: Edward Maynard MRN: [id number] Date of Birth: February 08, 1934 Today's Date: 05/11/2022 HPI/PMH: HPI: 89 year old male with atrial fibrillation (on Xarelto), prior stroke, presenting with acute onset of left hemiplegia and left hemineglect after sustaining a fall to the left while at home this evening. Underwent thrombectomy Clinical Impression: Clinical Impression: Pt demonstrates moderate to severe oral dysphagia witn left CN VII weakness and sensory impairment leading to severe anterior spillage with cup sips of liquids. Lingual, buccal and palatal residue present with most trials. Thin residue spills to pharynx post swallow. There is delayed swallow initaition with all liquids. No aspiration of thin occurred until pt attempted consecutive straw sips (PAS 7); however, there is higher risk with thin and severe anterior spillage. Nectar was better controlled orally and with a straw, which reduced oral residue and spillage. Pt unable to masticate solids without dentures and lingual manipulation of bolus very poor. Recommend purees and nectar thick liquids while pt learns strategies and improves awareness. Can quickly upgrade to thin as pt demonstrates awareness and control without severe coughing Factors that may increase risk of adverse event in presence of aspiration ([place] 2021): Factors that may increase risk of adverse event in presence of aspiration ([place] 2021): Limited mobility; Reduced cognitive function Recommendations/Plan: Swallowing Evaluation Recommendations Swallowing Evaluation Recommendations Liquid Administration via: Straw; Cup Medication Administration: Whole meds with puree Supervision: Full supervision/cueing for swallowing strategies Swallowing strategies  : Slow rate; Small bites/sips; Minimize environmental distractions; Check for  anterior loss Postural changes: Stay upright 30-60 min after meals Treatment Plan Treatment Plan Treatment recommendations: Therapy as outlined in treatment plan below Follow-up recommendations: Acute inpatient rehab (3 hours/day) Functional status assessment: Patient has had a recent decline in their functional status and demonstrates the ability to make significant improvements in function in a reasonable and predictable amount of time. Treatment frequency: Min 2x/week Treatment duration: 2 weeks Interventions: Aspiration precaution training; Diet toleration management by SLP; Trials of upgraded texture/liquids; Patient/family education; Compensatory techniques Recommendations Recommendations for follow up therapy are one component of a multi-disciplinary discharge planning process, led by the attending physician.  Recommendations may be updated based on patient status, additional functional criteria and insurance authorization. Assessment: Orofacial Exam: Orofacial Exam Oral Cavity - Dentition: Dentures, top; Dentures, bottom Anatomy: Anatomy: WFL Thin Liquids: Thin Liquids (Level 0) Thin Liquids : Impaired Bolus delivery method: Cup Thin Liquid - Impairment: Oral Impairment; Pharyngeal impairment Lip Closure: Escape beyond mid-chin Tongue control during bolus hold: Escape to lateral buccal cavity/floor of mouth Bolus transport/lingual motion: Repetitive/disorganized tongue motion Oral residue: Residue collection on oral structures Location of oral residue : Floor of mouth; Lateral sulci; Tongue Initiation of swallow : Pyriform sinuses Soft palate elevation: No bolus between soft palate (SP)/pharyngeal wall (PW) Laryngeal elevation: Complete superior movement of thyroid cartilage with complete approximation of arytenoids to epiglottic petiole Anterior hyoid  excursion: Complete Epiglottic movement: Complete Laryngeal vestibule closure: Complete, no air/contrast in laryngeal vestibule Pharyngeal stripping wave :  Present - complete Pharyngeal contraction (A/P view only): N/A Pharyngoesophageal segment opening: Complete distension and complete duration, no obstruction of flow Tongue base retraction: Trace column of contrast or air between tongue base and PPW Pharyngeal residue: Trace residue within or on pharyngeal structures Location of pharyngeal residue: Valleculae Penetration/Aspiration Scale (PAS) score: 7.  Material enters airway, passes BELOW cords and not ejected out despite cough attempt by patient; 1.  Material does not enter airway (aspiration with straw only)  Mildly Thick Liquids: Mildly thick liquids (Level 2, nectar thick) Mildly thick liquids (Level 2, nectar thick): Impaired Bolus delivery method: Cup Mildly Thick Liquid - Impairment: Oral Impairment; Pharyngeal impairment Lip Closure: Escape beyond mid-chin Tongue control during bolus hold: Escape to lateral buccal cavity/floor of mouth Bolus transport/lingual motion: Repetitive/disorganized tongue motion Oral residue: Residue collection on oral structures Location of oral residue : Lateral sulci; Floor of mouth Initiation of swallow : Pyriform sinuses Soft palate elevation: No bolus between soft palate (SP)/pharyngeal wall (PW) Laryngeal elevation: Complete superior movement of thyroid cartilage with complete approximation of arytenoids to epiglottic petiole Anterior hyoid excursion: Complete Epiglottic movement: Complete Laryngeal vestibule closure: Complete, no air/contrast in laryngeal vestibule Pharyngeal stripping wave : Present - complete Pharyngeal contraction (A/P view only): Complete; N/A Pharyngoesophageal segment opening: Complete distension and complete duration, no obstruction of flow Tongue base retraction: Trace column of contrast or air between tongue base and PPW Pharyngeal residue: Trace residue within or on pharyngeal structures Location of pharyngeal residue: Valleculae  Moderately Thick Liquids: Moderately thick liquids (Level 3, honey  thick) Moderately thick liquids (Level 3, honey thick): Impaired Bolus delivery method: Spoon Moderately Thick Liquid - Impairment: Oral Impairment Lip Closure: Escape from interlabial space or lateral juncture, no extension beyond vermillion border Tongue control during bolus hold: Escape to lateral buccal cavity/floor of mouth Bolus transport/lingual motion: Repetitive/disorganized tongue motion Oral residue: Residue collection on oral structures Location of oral residue : Floor of mouth; Lateral sulci Initiation of swallow : Posterior laryngeal surface of the epiglottis  Puree: Puree Puree: Impaired Puree - Impairment: Oral Impairment Lip Closure: Interlabial escape, no progression to anterior lip Bolus transport/lingual motion: Repetitive/disorganized tongue motion Oral residue: Trace residue lining oral structures Location of oral residue : Tongue Initiation of swallow: Valleculae Solid: Solid Solid: Impaired Solid - Impairment: Oral Impairment Lip Closure: Escape beyond mid-chin Bolus preparation/mastication: Minimal chewing/mashing with majority of bolus unchewed Bolus transport/lingual motion: Repetitive/disorganized tongue motion Oral residue: Majority of bolus remaining Location of oral residue : Lateral sulci Pill: Pill Pill: Not Tested Compensatory Strategies: Compensatory Strategies Compensatory strategies: Yes Straw: Effective Effective Straw: Mildly thick liquid (Level 2, nectar thick) (aspiration with thin)   General Information: Caregiver present: No  Diet Prior to this Study: NPO   No data recorded  No data recorded  Supplemental O2: None (Room air)   History of Recent Intubation: No  Behavior/Cognition: Alert; Cooperative; Pleasant mood No data recorded Baseline vocal quality/speech: Normal Volitional Cough: Able to elicit Volitional Swallow: Able to elicit No data recorded Goal Planning: Prognosis for improved oropharyngeal function: Good Barriers to Reach Goals: Cognitive deficits No data  recorded No data recorded Consulted and agree with results and recommendations: Patient; Family member/caregiver Pain: Pain Assessment Pain Assessment: Faces Faces Pain Scale: 0 Pain Intervention(s): Monitored during session End of Session: Start Time:SLP Start Time (ACUTE ONLY): [id number] Stop Time: SLP Stop Time (ACUTE ONLY):  1405 Time Calculation:SLP Time Calculation (min) (ACUTE ONLY): 20 min Charges: SLP Evaluations $ SLP Speech Visit: 1 Visit SLP Evaluations $BSS Swallow: 1 Procedure $MBS Swallow: 1 Procedure $ SLP EVAL LANGUAGE/SOUND PRODUCTION: 1 Procedure $Swallowing Treatment: 1 Procedure $Speech Treatment for Individual: 1 Procedure SLP visit diagnosis: SLP Visit Diagnosis: Dysphagia, oropharyngeal phase (R13.12) Past Medical History: Past Medical History: Diagnosis Date  High cholesterol   05/2013; no defecits  Hyperlipemia   Left arm weakness   Left hand weakness   Personal history of colonic polyp - adenoma  10/23/2013  Stroke ([place]) 05/2013  MRI on 07/02/13 = Multifocal acute & subacute infarction involving right frontal MCA/ACA & left parietal MCA/PCA watershed areas Past Surgical History: Past Surgical History: Procedure Laterality Date  IR CT HEAD LTD  05/10/2022  IR PERCUTANEOUS ART THROMBECTOMY/INFUSION INTRACRANIAL INC DIAG ANGIO  05/10/2022  LOOP RECORDER IMPLANT N/A 10/31/2013  Procedure: LOOP RECORDER IMPLANT;  Surgeon: Coralyn Mark, MD;  Location: [place] CATH LAB;  Service: Cardiovascular;  Laterality: N/A;  RADIOLOGY WITH ANESTHESIA N/A 05/10/2022  Procedure: IR WITH ANESTHESIA;  Surgeon: Luanne Bras, MD;  Location: [place];  Service: Radiology;  Laterality: N/A;  SHOULDER SURGERY Bilateral 2003, 1995 [place], Katherene Ponto 05/11/2022, 2:20 PM  IR PERCUTANEOUS ART THROMBECTOMY/INFUSION INTRACRANIAL INC DIAG ANGIO  Result Date: 05/11/2022 INDICATION: Worsening left-sided weakness and right gaze deviation. Occluded distal M2 segment of the inferior division of the right middle cerebral artery on CT  angiogram of the head and neck. EXAM: 1. EMERGENT LARGE VESSEL OCCLUSION THROMBOLYSIS anterior CIRCULATION) COMPARISON:  CT angiogram of the head and neck of May 10, 2022. MEDICATIONS: Ancef 2 g IV antibiotic was administered within 1 hour of the procedure. ANESTHESIA/SEDATION: General anesthesia. CONTRAST:  Omnipaque 300 approximately 70 mL. FLUOROSCOPY TIME:  Fluoroscopy Time: 41 minutes 16 seconds (2313 mGy). COMPLICATIONS: None immediate. TECHNIQUE: Following a full explanation of the procedure along with the potential associated complications, an informed witnessed consent was obtained. The risks of intracranial hemorrhage of 10%, worsening neurological deficit, ventilator dependency, death and inability to revascularize were all reviewed in detail with the patient's spouse. The patient was then put under general anesthesia by the Department of Anesthesiology at [facility]. The right groin was prepped and draped in the usual sterile fashion. Thereafter using modified Seldinger technique, transfemoral access into the right common femoral artery was obtained without difficulty. Over a 0.035 inch guidewire an 8 French 25 cm Pinnacle sheath was inserted. Through this a combination of a 125 cm 5 [place] Berenstein support catheter inside of an 087 95 cm balloon guide catheter was advanced initially proximal to the right common carotid bifurcation and then into the distal right internal carotid artery. FINDINGS: The right common carotid bifurcation demonstrates the right external carotid artery and its major branches to be widely patent. The right internal carotid artery at the bulb demonstrates a smooth shallow plaque along the posterior wall associated with 10% stenosis by the NASCET criteria. Distal to this the right internal carotid artery demonstrates opacification to the cranial skull base. The petrous, the cavernous and the supraclinoid right ICA demonstrate wide patency. A right posterior  communicating artery is seen opacifying the right posterior cerebral artery distribution. The right anterior cerebral artery opacifies into the capillary and venous phases. The right middle cerebral artery M1 segment is widely patent. There is an occlusion of a parietal branch of the inferior division of the right middle cerebral artery in the distal M3 region. Delayed arterial and capillary phase  demonstrates significant retrograde opacification of the distal right parietal cortical and subcortical distribution from the anterior cerebral and the P3 leptomeningeal branches of the right posterior cerebral artery. PROCEDURE: Through the balloon guide catheter in the distal right internal carotid artery, a combination of an 045 Zoom aspiration catheter with a 160 cm Trevo microcatheter was advanced over an 014 inch soft tip micro guidewire with a moderate J configuration to the supraclinoid right ICA. The micro guidewire was then gently manipulated with a torque device and advanced without difficulty distal to the occluded branch of the inferior division into the M3 region followed by the microcatheter. A 3 mm x 20 mm Solitaire X retrieval device was then deployed following verification of safe positioning of the tip of the microcatheter. This was then followed by the advancement of the Zoom aspiration catheter which was engaged just inside the occluded portion of the vessel, the micro guidewire and the microcatheter were retrieved proximally. Following 2 minutes of contact aspiration with a Penumbra aspiration device and proximal flow arrest, the Zoom aspiration catheter was removed. Two approximately 1 mm pearly sticky clots were retrieved. A control arteriogram demonstrated no significant change in the occluded parietal branch. A second pass was then made using the above combination. The microcatheter was advanced past the occlusion into the M3 region with the micro guidewire. The guidewire was removed. Good  aspiration obtained from the hub of the microcatheter which was then connected to continuous heparinized saline infusion. The Zoom aspiration catheter was then engaged just inside the occluded segment of the vessel. A 3 mm x 20 mm Solitaire X retrieval device was then deployed in the usual manner. Thereafter, constant aspiration was applied with proximal flow arrest the hub of the Zoom aspiration catheter for a minute and a half. Following this the combination of the retrieval device, the microcatheter and the aspiration catheter were removed. A control arteriogram performed through the balloon guide catheter following reversal of flow arrest now demonstrated improved flow through the occluded branch though slow to the M3 M4 regions. A third pass was then made using a combination using an 035 inch aspiration catheter advanced over an 014 inch soft tip micro guidewire with a moderate J configuration to the right middle cerebral artery. Access into the now reoccluded inferior division M2 branch was then made with the micro guidewire followed by the microcatheter into the distal M3 region. The guidewire was removed. The 035 inch Zoom aspiration was then engaged into the clot with no aspiration obtained. Aspiration was then applied with a Penumbra aspiration device for approximately 2 minutes with proximal flow arrest. The aspiration catheter was removed. A control arteriogram performed through the balloon guide catheter demonstrated mild improvement in revascularization of the occluded branch with the clot noted slightly more distally. A fourth pass was then made this time again using an 045 Zoom aspiration catheter advanced with an 021 microcatheter and over 014 inch micro guidewire. This combination was then advanced into the M3 region of the occluded branch followed by the microcatheter. The guidewire was removed. Good aspiration obtained from the hub of the microcatheter which was positioned distally in the M3  segment. This was then connected to continuous heparinized saline infusion. A 4 mm x 40 mm Solitaire X retrieval stent was then deployed in the usual manner with the 045 Zoom aspiration catheter imbedded in the occluded branch. Aspiration was then for approximately 2 minutes at the hub of the aspiration catheter with proximal flow arrest. Thereafter, the  retriever, the microcatheter and the Zoom aspiration catheter were removed. A control arteriogram performed following reversal of flow arrest in the right internal carotid artery, continued to demonstrate slow antegrade flow distal to the occluded segment. A control arteriogram performed through the balloon guide catheter continued to demonstrate a TICI 2C over revascularization with only mild distal flow past the occluded M2 branch of the right inferior division MCA. No further passes were made due to now steadily increased risk of vessel injury perforation. The balloon guide was retrieved in the right common carotid artery. A control arteriogram performed through this continued to demonstrate patency of right internal carotid artery proximally and distally including that of the right posterior communicating artery, the right anterior cerebral artery and the right middle cerebral artery distributions. An 8 French Angio-Seal closure device was then deployed in the right groin puncture site for hemostasis. Distal pulses remained Dopplerable in both feet unchanged from prior to the procedure. A flat panel CT of the brain demonstrated no evidence of intra cerebral hemorrhage. The patient's general anesthesia was reversed, and patient was extubated. Initially agitated, the patient gradually settled down to where simple commands appropriately and verbalized appropriately. The patient moved his left lower extremities spontaneously and to command with good strength. Slight movement was evident in the left upper extremity. The patient was then transferred to the neuro ICU  for post revascularization management. IMPRESSION: Status post endovascular revascularization of occluded distal M2 branch of the right middle cerebral artery inferior division with 2 passes with a 3 mm x 20 mm retrieval device and contact aspiration, 1 pass with a 4 mm x 40 mm Solitaire X retrieval device and contact aspiration, 1 pass with contact aspiration with minimal improvement in revascularization of the occluded right MCA inferior division M2 segment occlusion. A TICI 2C revascularization was maintained. PLAN: Follow-up as per referring MD. Electronically Signed   By: Luanne Bras M.D.   On: 05/11/2022 08:28   IR CT Head Ltd  Result Date: 05/11/2022 INDICATION: Worsening left-sided weakness and right gaze deviation. Occluded distal M2 segment of the inferior division of the right middle cerebral artery on CT angiogram of the head and neck. EXAM: 1. EMERGENT LARGE VESSEL OCCLUSION THROMBOLYSIS anterior CIRCULATION) COMPARISON:  CT angiogram of the head and neck of May 10, 2022. MEDICATIONS: Ancef 2 g IV antibiotic was administered within 1 hour of the procedure. ANESTHESIA/SEDATION: General anesthesia. CONTRAST:  Omnipaque 300 approximately 70 mL. FLUOROSCOPY TIME:  Fluoroscopy Time: 41 minutes 16 seconds (2313 mGy). COMPLICATIONS: None immediate. TECHNIQUE: Following a full explanation of the procedure along with the potential associated complications, an informed witnessed consent was obtained. The risks of intracranial hemorrhage of 10%, worsening neurological deficit, ventilator dependency, death and inability to revascularize were all reviewed in detail with the patient's spouse. The patient was then put under general anesthesia by the Department of Anesthesiology at [facility]. The right groin was prepped and draped in the usual sterile fashion. Thereafter using modified Seldinger technique, transfemoral access into the right common femoral artery was obtained without  difficulty. Over a 0.035 inch guidewire an 8 French 25 cm Pinnacle sheath was inserted. Through this a combination of a 125 cm 5 [place] Berenstein support catheter inside of an 087 95 cm balloon guide catheter was advanced initially proximal to the right common carotid bifurcation and then into the distal right internal carotid artery. FINDINGS: The right common carotid bifurcation demonstrates the right external carotid artery and its major branches to be  widely patent. The right internal carotid artery at the bulb demonstrates a smooth shallow plaque along the posterior wall associated with 10% stenosis by the NASCET criteria. Distal to this the right internal carotid artery demonstrates opacification to the cranial skull base. The petrous, the cavernous and the supraclinoid right ICA demonstrate wide patency. A right posterior communicating artery is seen opacifying the right posterior cerebral artery distribution. The right anterior cerebral artery opacifies into the capillary and venous phases. The right middle cerebral artery M1 segment is widely patent. There is an occlusion of a parietal branch of the inferior division of the right middle cerebral artery in the distal M3 region. Delayed arterial and capillary phase demonstrates significant retrograde opacification of the distal right parietal cortical and subcortical distribution from the anterior cerebral and the P3 leptomeningeal branches of the right posterior cerebral artery. PROCEDURE: Through the balloon guide catheter in the distal right internal carotid artery, a combination of an 045 Zoom aspiration catheter with a 160 cm Trevo microcatheter was advanced over an 014 inch soft tip micro guidewire with a moderate J configuration to the supraclinoid right ICA. The micro guidewire was then gently manipulated with a torque device and advanced without difficulty distal to the occluded branch of the inferior division into the M3 region followed by the  microcatheter. A 3 mm x 20 mm Solitaire X retrieval device was then deployed following verification of safe positioning of the tip of the microcatheter. This was then followed by the advancement of the Zoom aspiration catheter which was engaged just inside the occluded portion of the vessel, the micro guidewire and the microcatheter were retrieved proximally. Following 2 minutes of contact aspiration with a Penumbra aspiration device and proximal flow arrest, the Zoom aspiration catheter was removed. Two approximately 1 mm pearly sticky clots were retrieved. A control arteriogram demonstrated no significant change in the occluded parietal branch. A second pass was then made using the above combination. The microcatheter was advanced past the occlusion into the M3 region with the micro guidewire. The guidewire was removed. Good aspiration obtained from the hub of the microcatheter which was then connected to continuous heparinized saline infusion. The Zoom aspiration catheter was then engaged just inside the occluded segment of the vessel. A 3 mm x 20 mm Solitaire X retrieval device was then deployed in the usual manner. Thereafter, constant aspiration was applied with proximal flow arrest the hub of the Zoom aspiration catheter for a minute and a half. Following this the combination of the retrieval device, the microcatheter and the aspiration catheter were removed. A control arteriogram performed through the balloon guide catheter following reversal of flow arrest now demonstrated improved flow through the occluded branch though slow to the M3 M4 regions. A third pass was then made using a combination using an 035 inch aspiration catheter advanced over an 014 inch soft tip micro guidewire with a moderate J configuration to the right middle cerebral artery. Access into the now reoccluded inferior division M2 branch was then made with the micro guidewire followed by the microcatheter into the distal M3 region. The  guidewire was removed. The 035 inch Zoom aspiration was then engaged into the clot with no aspiration obtained. Aspiration was then applied with a Penumbra aspiration device for approximately 2 minutes with proximal flow arrest. The aspiration catheter was removed. A control arteriogram performed through the balloon guide catheter demonstrated mild improvement in revascularization of the occluded branch with the clot noted slightly more distally. A fourth pass  was then made this time again using an 045 Zoom aspiration catheter advanced with an 021 microcatheter and over 014 inch micro guidewire. This combination was then advanced into the M3 region of the occluded branch followed by the microcatheter. The guidewire was removed. Good aspiration obtained from the hub of the microcatheter which was positioned distally in the M3 segment. This was then connected to continuous heparinized saline infusion. A 4 mm x 40 mm Solitaire X retrieval stent was then deployed in the usual manner with the 045 Zoom aspiration catheter imbedded in the occluded branch. Aspiration was then for approximately 2 minutes at the hub of the aspiration catheter with proximal flow arrest. Thereafter, the retriever, the microcatheter and the Zoom aspiration catheter were removed. A control arteriogram performed following reversal of flow arrest in the right internal carotid artery, continued to demonstrate slow antegrade flow distal to the occluded segment. A control arteriogram performed through the balloon guide catheter continued to demonstrate a TICI 2C over revascularization with only mild distal flow past the occluded M2 branch of the right inferior division MCA. No further passes were made due to now steadily increased risk of vessel injury perforation. The balloon guide was retrieved in the right common carotid artery. A control arteriogram performed through this continued to demonstrate patency of right internal carotid artery proximally  and distally including that of the right posterior communicating artery, the right anterior cerebral artery and the right middle cerebral artery distributions. An 8 French Angio-Seal closure device was then deployed in the right groin puncture site for hemostasis. Distal pulses remained Dopplerable in both feet unchanged from prior to the procedure. A flat panel CT of the brain demonstrated no evidence of intra cerebral hemorrhage. The patient's general anesthesia was reversed, and patient was extubated. Initially agitated, the patient gradually settled down to where simple commands appropriately and verbalized appropriately. The patient moved his left lower extremities spontaneously and to command with good strength. Slight movement was evident in the left upper extremity. The patient was then transferred to the neuro ICU for post revascularization management. IMPRESSION: Status post endovascular revascularization of occluded distal M2 branch of the right middle cerebral artery inferior division with 2 passes with a 3 mm x 20 mm retrieval device and contact aspiration, 1 pass with a 4 mm x 40 mm Solitaire X retrieval device and contact aspiration, 1 pass with contact aspiration with minimal improvement in revascularization of the occluded right MCA inferior division M2 segment occlusion. A TICI 2C revascularization was maintained. PLAN: Follow-up as per referring MD. Electronically Signed   By: Luanne Bras M.D.   On: 05/11/2022 08:28   MR BRAIN WO CONTRAST  Result Date: 05/10/2022 CLINICAL DATA:  Stroke, follow up EXAM: MRI HEAD WITHOUT CONTRAST MRA HEAD WITHOUT CONTRAST TECHNIQUE: Multiplanar, multi-echo pulse sequences of the brain and surrounding structures were acquired without intravenous contrast. Angiographic images of the Circle of Willis were acquired using MRA technique without intravenous contrast. COMPARISON:  CTA and CT head from same day. FINDINGS: MRI HEAD FINDINGS Motion limited study.  Brain: Acute posterior right MCA territory infarct. Associated edema and regional mass effect without significant midline shift. No mass occupying acute hemorrhage. No mass lesion. No hydrocephalus. Remote infarct in the right cerebellum. Vascular: Detailed below. Skull and upper cervical spine: Normal marrow signal. Sinuses/Orbits: Clear sinuses.  No acute orbital findings. MRA HEAD FINDINGS Severely motion limited study. Anterior circulation: Bilateral intracranial ICAs, proximal ACAs and proximal M1 MCAs appear grossly patent. Essentially nondiagnostic  evaluation of the M2 MCAs, including the region of occlusion seen on recent CTA. Posterior circulation: The intradural vertebral arteries, basilar arteries and proximal PCAs are grossly patent. IMPRESSION: 1. Acute right posterior MCA territory infarct. Associated edema without midline shift. 2. Severely motion limited MRA with nondiagnostic evaluation of the right M2 MCA in the region of recently seen occlusion on CTA. A repeat exclude exam may be able to better assess if the patient is able in clinically warranted. Electronically Signed   By: Margaretha Sheffield M.D.   On: 05/10/2022 16:03   MR ANGIO HEAD WO CONTRAST  Result Date: 05/10/2022 CLINICAL DATA:  Stroke, follow up EXAM: MRI HEAD WITHOUT CONTRAST MRA HEAD WITHOUT CONTRAST TECHNIQUE: Multiplanar, multi-echo pulse sequences of the brain and surrounding structures were acquired without intravenous contrast. Angiographic images of the Circle of Willis were acquired using MRA technique without intravenous contrast. COMPARISON:  CTA and CT head from same day. FINDINGS: MRI HEAD FINDINGS Motion limited study. Brain: Acute posterior right MCA territory infarct. Associated edema and regional mass effect without significant midline shift. No mass occupying acute hemorrhage. No mass lesion. No hydrocephalus. Remote infarct in the right cerebellum. Vascular: Detailed below. Skull and upper cervical spine: Normal  marrow signal. Sinuses/Orbits: Clear sinuses.  No acute orbital findings. MRA HEAD FINDINGS Severely motion limited study. Anterior circulation: Bilateral intracranial ICAs, proximal ACAs and proximal M1 MCAs appear grossly patent. Essentially nondiagnostic evaluation of the M2 MCAs, including the region of occlusion seen on recent CTA. Posterior circulation: The intradural vertebral arteries, basilar arteries and proximal PCAs are grossly patent. IMPRESSION: 1. Acute right posterior MCA territory infarct. Associated edema without midline shift. 2. Severely motion limited MRA with nondiagnostic evaluation of the right M2 MCA in the region of recently seen occlusion on CTA. A repeat exclude exam may be able to better assess if the patient is able in clinically warranted. Electronically Signed   By: Margaretha Sheffield M.D.   On: 05/10/2022 16:03   ECHOCARDIOGRAM COMPLETE  Result Date: 05/10/2022    ECHOCARDIOGRAM REPORT   Patient Name:   LUCCAS STRYCKER Date of Exam: 05/10/2022 Medical Rec #:  [id number]     Height:       71.5 in Accession #:    [id number]    Weight:       223.1 lb Date of Birth:  [birth year]    BSA:          2.220 m Patient Age:    43 years      BP:           160/90 mmHg Patient Gender: M             HR:           73 bpm. Exam Location:  Inpatient Procedure: 2D Echo, Cardiac Doppler and Color Doppler Indications:    Stroke I63.9  History:        Patient has prior history of Echocardiogram examinations and                 Patient has no prior history of Echocardiogram examinations.                 Pacemaker, Stroke, Arrythmias:PVC and Tachycardia; Risk                 Factors:Dyslipidemia.  Sonographer:    Ronny Flurry Referring Phys: [phone] ERIC LINDZEN  Sonographer Comments: Image acquisition challenging due to respiratory motion. IMPRESSIONS  1. Left ventricular ejection  fraction, by estimation, is 60 to 65%. The left ventricle has normal function. The left ventricle has no regional wall motion  abnormalities. Left ventricular diastolic parameters are consistent with Grade II diastolic dysfunction (pseudonormalization).  2. Right ventricular systolic function is normal. The right ventricular size is normal. There is normal pulmonary artery systolic pressure.  3. The mitral valve is normal in structure. No evidence of mitral valve regurgitation. No evidence of mitral stenosis.  4. The aortic valve is calcified. Aortic valve regurgitation is trivial. Mild aortic valve stenosis. Aortic valve area, by VTI measures 1.55 cm. Aortic valve mean gradient measures 19.0 mmHg. Aortic valve Vmax measures 2.94 m/s.  5. The inferior vena cava is normal in size with greater than 50% respiratory variability, suggesting right atrial pressure of 3 mmHg. FINDINGS  Left Ventricle: Left ventricular ejection fraction, by estimation, is 60 to 65%. The left ventricle has normal function. The left ventricle has no regional wall motion abnormalities. The left ventricular internal cavity size was normal in size. There is  no left ventricular hypertrophy. Left ventricular diastolic parameters are consistent with Grade II diastolic dysfunction (pseudonormalization). Indeterminate filling pressures. Right Ventricle: The right ventricular size is normal. No increase in right ventricular wall thickness. Right ventricular systolic function is normal. There is normal pulmonary artery systolic pressure. The tricuspid regurgitant velocity is 1.45 m/s, and  with an assumed right atrial pressure of 3 mmHg, the estimated right ventricular systolic pressure is [id number] mmHg. Left Atrium: Left atrial size was normal in size. Right Atrium: Right atrial size was normal in size. Pericardium: There is no evidence of pericardial effusion. Mitral Valve: The mitral valve is normal in structure. No evidence of mitral valve regurgitation. No evidence of mitral valve stenosis. Tricuspid Valve: The tricuspid valve is normal in structure. Tricuspid valve  regurgitation is trivial. No evidence of tricuspid stenosis. Aortic Valve: The aortic valve is calcified. Aortic valve regurgitation is trivial. Mild aortic stenosis is present. Aortic valve mean gradient measures 19.0 mmHg. Aortic valve peak gradient measures 34.5 mmHg. Aortic valve area, by VTI measures 1.55 cm. Pulmonic Valve: The pulmonic valve was normal in structure. Pulmonic valve regurgitation is not visualized. No evidence of pulmonic stenosis. Aorta: The aortic root is normal in size and structure. Venous: The inferior vena cava is normal in size with greater than 50% respiratory variability, suggesting right atrial pressure of 3 mmHg. IAS/Shunts: No atrial level shunt detected by color flow Doppler.  LEFT VENTRICLE PLAX 2D LVIDd:         4.60 cm   Diastology LVIDs:         3.70 cm   LV e' medial:    7.80 cm/s LV PW:         1.30 cm   LV E/e' medial:  10.4 LV IVS:        1.20 cm   LV e' lateral:   11.60 cm/s LVOT diam:     2.20 cm   LV E/e' lateral: 7.0 LV SV:         89 LV SV Index:   40 LVOT Area:     3.80 cm  RIGHT VENTRICLE             IVC RV S prime:     16.60 cm/s  IVC diam: 2.10 cm TAPSE (M-mode): 1.5 cm LEFT ATRIUM             Index        RIGHT ATRIUM  Index LA diam:        4.60 cm 2.07 cm/m   RA Area:     15.10 cm LA Vol (A2C):   64.7 ml 29.14 ml/m  RA Volume:   31.80 ml  14.32 ml/m LA Vol (A4C):   58.7 ml 26.44 ml/m LA Biplane Vol: 62.9 ml 28.33 ml/m  AORTIC VALVE AV Area (Vmax):    1.61 cm AV Area (Vmean):   1.68 cm AV Area (VTI):     1.55 cm AV Vmax:           293.50 cm/s AV Vmean:          189.800 cm/s AV VTI:            0.570 m AV Peak Grad:      34.5 mmHg AV Mean Grad:      19.0 mmHg LVOT Vmax:         124.00 cm/s LVOT Vmean:        83.833 cm/s LVOT VTI:          0.233 m LVOT/AV VTI ratio: 0.41  AORTA Ao Root diam: 3.30 cm MITRAL VALVE               TRICUSPID VALVE MV Area (PHT): 4.68 cm    TR Peak grad:   8.4 mmHg MV Decel Time: 162 msec    TR Vmax:        145.00 cm/s  MV E velocity: 81.00 cm/s MV A velocity: 65.50 cm/s  SHUNTS MV E/A ratio:  1.24        Systemic VTI:  0.23 m                            Systemic Diam: 2.20 cm Skeet Latch MD Electronically signed by Skeet Latch MD Signature Date/Time: 05/10/2022/10:48:39 AM    Final    CT HEAD CODE STROKE WO CONTRAST  Result Date: 05/10/2022 CLINICAL DATA:  Acute neurologic deficit EXAM: CT HEAD WITHOUT CONTRAST CT CERVICAL SPINE WITHOUT CONTRAST TECHNIQUE: Multidetector CT imaging of the head and cervical spine was performed following the standard protocol without intravenous contrast. Multiplanar CT image reconstructions of the cervical spine were also generated. RADIATION DOSE REDUCTION: This exam was performed according to the departmental dose-optimization program which includes automated exposure control, adjustment of the mA and/or kV according to patient size and/or use of iterative reconstruction technique. COMPARISON:  None Available. FINDINGS: CT HEAD FINDINGS Brain: There is no mass, hemorrhage or extra-axial collection. Generalized atrophy. Multiple old small vessel infarcts of the cerebellum. There is hypoattenuation of the periventricular white matter, most commonly indicating chronic ischemic microangiopathy. Old right frontal and parietal infarcts. Vascular: No abnormal hyperdensity of the major intracranial arteries or dural venous sinuses. No intracranial atherosclerosis. Skull: The visualized skull base, calvarium and extracranial soft tissues are normal. Sinuses/Orbits: No fluid levels or advanced mucosal thickening of the visualized paranasal sinuses. No mastoid or middle ear effusion. The orbits are normal. ASPECTS ([place] Stroke Program Early CT Score) - Ganglionic level infarction (caudate, lentiform nuclei, internal capsule, insula, M1-M3 cortex): 7 - Supraganglionic infarction (M4-M6 cortex): 3 Total score (0-10 with 10 being normal): 10 CT CERVICAL SPINE FINDINGS Alignment: No static  subluxation. Facets are aligned. Occipital condyles and the lateral masses of C1 and C2 are normally approximated. Skull base and vertebrae: No acute fracture. Soft tissues and spinal canal: No prevertebral fluid or swelling. No visible canal hematoma. Disc levels: No advanced  spinal canal or neural foraminal stenosis. Upper chest: No pneumothorax, pulmonary nodule or pleural effusion. Other: Normal visualized paraspinal cervical soft tissues. IMPRESSION: 1. No acute intracranial abnormality. 2. ASPECTS is 10. 3. No acute fracture or static subluxation of the cervical spine. These results were communicated to Dr. Kerney Elbe at 12:24 am on 05/10/2022 by text page via the [facility] messaging system. Electronically Signed   By: Ulyses Jarred M.D.   On: 05/10/2022 00:47   CT C-SPINE NO CHARGE  Result Date: 05/10/2022 CLINICAL DATA:  Acute neurologic deficit EXAM: CT HEAD WITHOUT CONTRAST CT CERVICAL SPINE WITHOUT CONTRAST TECHNIQUE: Multidetector CT imaging of the head and cervical spine was performed following the standard protocol without intravenous contrast. Multiplanar CT image reconstructions of the cervical spine were also generated. RADIATION DOSE REDUCTION: This exam was performed according to the departmental dose-optimization program which includes automated exposure control, adjustment of the mA and/or kV according to patient size and/or use of iterative reconstruction technique. COMPARISON:  None Available. FINDINGS: CT HEAD FINDINGS Brain: There is no mass, hemorrhage or extra-axial collection. Generalized atrophy. Multiple old small vessel infarcts of the cerebellum. There is hypoattenuation of the periventricular white matter, most commonly indicating chronic ischemic microangiopathy. Old right frontal and parietal infarcts. Vascular: No abnormal hyperdensity of the major intracranial arteries or dural venous sinuses. No intracranial atherosclerosis. Skull: The visualized skull base, calvarium and  extracranial soft tissues are normal. Sinuses/Orbits: No fluid levels or advanced mucosal thickening of the visualized paranasal sinuses. No mastoid or middle ear effusion. The orbits are normal. ASPECTS ([place] Stroke Program Early CT Score) - Ganglionic level infarction (caudate, lentiform nuclei, internal capsule, insula, M1-M3 cortex): 7 - Supraganglionic infarction (M4-M6 cortex): 3 Total score (0-10 with 10 being normal): 10 CT CERVICAL SPINE FINDINGS Alignment: No static subluxation. Facets are aligned. Occipital condyles and the lateral masses of C1 and C2 are normally approximated. Skull base and vertebrae: No acute fracture. Soft tissues and spinal canal: No prevertebral fluid or swelling. No visible canal hematoma. Disc levels: No advanced spinal canal or neural foraminal stenosis. Upper chest: No pneumothorax, pulmonary nodule or pleural effusion. Other: Normal visualized paraspinal cervical soft tissues. IMPRESSION: 1. No acute intracranial abnormality. 2. ASPECTS is 10. 3. No acute fracture or static subluxation of the cervical spine. These results were communicated to Dr. Kerney Elbe at 12:24 am on 05/10/2022 by text page via the [facility] messaging system. Electronically Signed   By: Ulyses Jarred M.D.   On: 05/10/2022 00:47   CT ANGIO HEAD NECK W WO CM (CODE STROKE)  Result Date: 05/10/2022 CLINICAL DATA:  Acute neurologic deficit EXAM: CT ANGIOGRAPHY HEAD AND NECK TECHNIQUE: Multidetector CT imaging of the head and neck was performed using the standard protocol during bolus administration of intravenous contrast. Multiplanar CT image reconstructions and MIPs were obtained to evaluate the vascular anatomy. Carotid stenosis measurements (when applicable) are obtained utilizing NASCET criteria, using the distal internal carotid diameter as the denominator. RADIATION DOSE REDUCTION: This exam was performed according to the departmental dose-optimization program which includes automated exposure  control, adjustment of the mA and/or kV according to patient size and/or use of iterative reconstruction technique. COMPARISON:  None Available. FINDINGS: CTA NECK FINDINGS SKELETON: There is no bony spinal canal stenosis. No lytic or blastic lesion. OTHER NECK: 12 mm right parotid nodule (series 7 image 81). UPPER CHEST: No pneumothorax or pleural effusion. No nodules or masses. AORTIC ARCH: There is calcific atherosclerosis of the aortic arch. There is no aneurysm,  dissection or hemodynamically significant stenosis of the visualized portion of the aorta. Normal variant aortic arch branching pattern with the left vertebral artery arising independently from the aortic arch. The visualized proximal subclavian arteries are widely patent. RIGHT CAROTID SYSTEM: Atherosclerotic web at the bifurcation. Mixed density plaque with less than 50% stenosis. Widely patent ICA. LEFT CAROTID SYSTEM: Calcific atherosclerosis at the bifurcation without hemodynamically significant stenosis. VERTEBRAL ARTERIES: Right dominant configuration. Both origins are clearly patent. There is no dissection, occlusion or flow-limiting stenosis to the skull base (V1-V3 segments). CTA HEAD FINDINGS POSTERIOR CIRCULATION: --Vertebral arteries: Right V4 segment atherosclerosis with widely maintained patency. --Inferior cerebellar arteries: Normal. --Basilar artery: Normal. --Superior cerebellar arteries: Normal. --Posterior cerebral arteries (PCA): Normal. ANTERIOR CIRCULATION: --Intracranial internal carotid arteries: Atherosclerotic calcification of the internal carotid arteries at the skull base without hemodynamically significant stenosis. --Anterior cerebral arteries (ACA): Normal. Both A1 segments are present. Patent anterior communicating artery (a-comm). --Middle cerebral arteries (MCA): There is occlusion of the proximal M2 segment inferior division of the right MCA (series 10, image 114). Left MCA is normal. VENOUS SINUSES: As permitted by  contrast timing, patent. ANATOMIC VARIANTS: Fetal origin of the right posterior cerebral artery. Review of the MIP images confirms the above findings. IMPRESSION: 1. Occlusion of the proximal M2 segment inferior division of the right MCA. 2. 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 3. Nonspecific 12 mm right parotid nodule. 4. Critical Value/emergent results were called by telephone at the time of interpretation on 05/10/2022 at 12:38 am to provider ERIC [facility] , who verbally acknowledged these results. By Electronically Signed   By: Ulyses Jarred M.D.   On: 05/10/2022 00:39      HISTORY OF PRESENT ILLNESS Mr. ANTHONEY DICKE is a 89 y.o. male with history of A-fib on Xarelto, hyperlipidemia, stroke in 05/2013 admitted for left-sided weakness and he fell at home. No tPA given due to outside window    HOSPITAL COURSE Stroke:  right large MCA infarct due to right M2 occlusion status post IR with  [id number], embolic secondary to A-fib given on Xarelto  CT no acute abnormality Repeat CT 2/26 No new abnormality. Large right posterior division MCA territory infarct with mild petechial hemorrhage. Chronic cerebellar and thalamic small vessel infarcts. 15 mm right parotid mass CTA head and neck right M2 proximal occlusion, right ICA bulb atherosclerosis IR with right M2 distal occlusion. S/p 4 passes, TICI2c  MRI large right MCA infarct MRA persistent right MCA occlusion although motion degraded 2D Echo EF 60 to 65% LDL 56 HgbA1c 5.8 VTE prophylaxis: eliquis Xarelto (rivaroxaban) daily prior to admission, now on eliquis   Ongoing aggressive stroke risk factor management Therapy recommendations: CIR  Disposition: Pending SNF placement as daughter will be unable to care for patient after rehab. Daughter is hoping to find a facility where both patient and his wife can be together.  History of stroke 05/2013 admitted for left-sided weakness and imbalance.  MRI showed  right frontal MCA/ACA and left parietal MCA/PCA infarcts.  MRA/TTE/carotid Doppler negative.  Discharged on aspirin and Zocor. Outpatient 30-day CardioNet monitoring negative and loop recorder placed.  Found to have A-fib in 12/2014, started on Xarelto.  Mild left sided residual at baseline.   Chronic A-fib On Xarelto at home Currently [facility] on hold given large size of infarct Now on Eliquis    Hypertension Home meds: toprol 12.5 Stable on the high end BP goal < 180/105 Off cleviprex Put on amlodipine '10mg'$   and increase home toprol from 12.5 to 25 Long term BP goal normotensive   Hyperlipidemia Home meds: Zocor 40 LDL 56, goal < 70 Now on lipitor 20 (zocor 40 equivalent, as amlodipine can increase the risk of rhabdo when given with Zocor )  No high intensity statin given LDL at goal Continue statin at discharge  Dysphagia  Pass swallow On dys 1  puree and nectar thick liquid IVF added back d/t increased creatinine Encourage po intake   Other Stroke Risk Factors Advanced age Obesity, Body mass index is 30.68 kg/m.      DISCHARGE EXAM Blood pressure (!) 154/74, pulse 78, temperature 98.3 F (36.8 C), temperature source Oral, resp. rate 18, height 5' 11.5" (1.816 m), weight 101.2 kg, SpO2 99 %. ***  Discharge Diet       Diet   DIET - DYS 1 Room service appropriate? Yes with Assist; Fluid consistency: Nectar Thick   liquids  DISCHARGE PLAN Disposition:  *** Eliquis (apixaban) daily for secondary stroke prevention  Ongoing stroke risk factor control by Primary Care Physician at time of discharge Follow-up PCP Gaynelle Arabian, MD in 2 weeks. Follow-up in [facility] in 8 weeks, office to schedule an appointment.   *** minutes were spent preparing discharge.  ***

## 2022-05-16 NOTE — NC FL2 (Addendum)
[place] LEVEL OF CARE FORM     IDENTIFICATION  Patient Name: Edward Maynard Birthdate: 02-22-1934 Sex: male Admission Date (Current Location): 05/10/2022  [facility] and [place] Number:  Herbalist and Address:  [facility], [place] [address], [place], [place] [address]      Provider Number: [id number]  Attending Physician Name and Address:  Stroke, Md, MD  Relative Name and Phone Number:       Current Level of Care: Hospital Recommended Level of Care: [place] Prior Approval Number:    Date Approved/Denied:   PASRR Number: [id number]  Discharge Plan: SNF    Current Diagnoses: Patient Active Problem List   Diagnosis Date Noted   Stroke (cerebrum) ([place]) 05/10/2022   Middle cerebral artery embolism, 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   Chronic a-fib ([place]) 05/10/2022   Paroxysmal atrial fibrillation ([place]) 01/18/2015   Chronic anticoagulation 01/18/2015   PVC's (premature ventricular contractions) 12/14/2014   NSVT (nonsustained ventricular tachycardia) ([place]) 12/14/2014   Cerebral infarction due to embolism of cerebral artery ([place]) 07/10/2014   Hyperlipidemia 12/12/2013   Personal history of colonic polyp - adenoma  10/23/2013    Orientation RESPIRATION BLADDER Height & Weight     Self, Situation, Place  Normal Incontinent Weight: 223 lb 1.7 oz (101.2 kg) Height:  5' 11.5" (181.6 cm)  BEHAVIORAL SYMPTOMS/MOOD NEUROLOGICAL BOWEL NUTRITION STATUS      Continent Diet (see DC summary)  AMBULATORY STATUS COMMUNICATION OF NEEDS Skin   Extensive Assist Verbally Skin abrasions                       Personal Care Assistance Level of Assistance  Bathing, Feeding, Dressing Bathing Assistance: Maximum assistance Feeding assistance: Limited assistance Dressing Assistance: Maximum assistance     Functional Limitations Info  Sight Sight Info: Impaired (impaired left eye)        SPECIAL CARE FACTORS FREQUENCY  PT (By  licensed PT), OT (By licensed OT), Speech therapy     PT Frequency: 5x/wk OT Frequency: 5x/wk     Speech Therapy Frequency: 5x/wk      Contractures Contractures Info: Not present    Additional Factors Info  Code Status, Allergies Code Status Info: Full Allergies Info: NKA           Current Medications (05/16/2022):  This is the current hospital active medication list Current Facility-Administered Medications  Medication Dose Route Frequency Provider Last Rate Last Admin   0.9 %  sodium chloride infusion   Intravenous Continuous Lovey Newcomer C, NP 50 mL/hr at 05/16/22 1246 Infusion Verify at 05/16/22 1246   acetaminophen (TYLENOL) tablet 650 mg  650 mg Oral Q4H PRN Luanne Bras, MD   650 mg at 05/14/22 2340   Or   acetaminophen (TYLENOL) 160 MG/5ML solution 650 mg  650 mg Per Tube Q4H PRN Deveshwar, Willaim Rayas, MD       Or   acetaminophen (TYLENOL) suppository 650 mg  650 mg Rectal Q4H PRN Deveshwar, Willaim Rayas, MD       amLODipine (NORVASC) tablet 10 mg  10 mg Oral Daily Rosalin Hawking, MD   10 mg at 05/16/22 1136   apixaban (ELIQUIS) tablet 5 mg  5 mg Oral BID Hammons, Kimberly B, RPH   5 mg at 05/16/22 1136   atorvastatin (LIPITOR) tablet 20 mg  20 mg Oral Daily Pham, Minh Q, RPH-CPP   20 mg at 05/16/22 1135   bisacodyl (DULCOLAX) suppository 10 mg  10 mg Rectal Daily PRN Otelia Santee, NP       Chlorhexidine Gluconate Cloth 2 % PADS 6 each  6 each Topical Daily Rosalin Hawking, MD   6 each at 05/16/22 1000   docusate sodium (COLACE) capsule 100 mg  100 mg Oral Daily Lovey Newcomer C, NP       feeding supplement (ENSURE ENLIVE / ENSURE PLUS) liquid 237 mL  237 mL Oral TID BM Rosalin Hawking, MD       labetalol (NORMODYNE) injection 5-20 mg  5-20 mg Intravenous Q2H PRN Rosalin Hawking, MD       metoprolol succinate (TOPROL-XL) 24 hr tablet 25 mg  25 mg Oral Daily Rosalin Hawking, MD   25 mg at 05/16/22 1136   Oral care mouth rinse  15 mL Mouth Rinse PRN Kerney Elbe, MD   15 mL at 05/10/22 2300    potassium chloride SA (KLOR-CON M) CR tablet 20 mEq  20 mEq Oral BID Lovey Newcomer C, NP   20 mEq at 05/16/22 1220   senna-docusate (Senokot-S) tablet 1 tablet  1 tablet Oral QHS PRN Kerney Elbe, MD   1 tablet at 05/13/22 0849   sodium chloride 0.9 % IV infusion   Intravenous Continuous Dorene Grebe, MD   Stopped at 05/15/22 0847   traZODone (DESYREL) tablet 50 mg  50 mg Oral QHS PRN Dorene Grebe, MD   50 mg at 05/14/22 2340     Discharge Medications: Please see discharge summary for a list of discharge medications.  Relevant Imaging Results:  Relevant Lab Results:   Additional Information SS#: [id number]  Geralynn Ochs, LCSW  I have personally obtained history,examined this patient, reviewed notes, independently viewed imaging studies, participated in medical decision making and plan of care.ROS completed by me personally and pertinent positives fully documented  I have made any additions or clarifications directly to the above note. Agree with note above.    Antony Contras, MD Medical Director [facility] Pager: [phone] 05/16/2022 2:15 PM

## 2022-05-17 NOTE — Plan of Care (Signed)
  Problem: Education: Goal: Knowledge of disease or condition will improve Outcome: Progressing   Problem: Coping: Goal: Will identify appropriate support needs Outcome: Progressing   Problem: Self-Care: Goal: Ability to communicate needs accurately will improve Outcome: Progressing   Problem: Nutrition: Goal: Dietary intake will improve Outcome: Progressing   Problem: Clinical Measurements: Goal: Will remain free from infection Outcome: Progressing   Problem: Health Behavior/Discharge Planning: Goal: Ability to manage health-related needs will improve Outcome: Not Progressing

## 2022-05-17 NOTE — TOC Progression Note (Signed)
Transition of Care [facility]) - Progression Note    Patient Details  Name: Edward Maynard MRN: [id number] Date of Birth: March 07, 1934  Transition of Care [facility]) CM/SW [place], [place] Phone Number: 05/17/2022, 3:05 PM  Clinical Narrative:   CSW spoke with daughter, Edward Maynard, about plan for patient. Edward Maynard would prefer CIR, and has made a plan to take the patient home afterwards. CSW updated Rehab Admissions, they will follow back up with family.   UPDATE: CSW notified that family is looking at hiring private duty caregivers. CSW provided information to daughter to assist.    Expected Discharge Plan: IP Rehab Facility Barriers to Discharge: Continued Medical Work up, Orthoptist and Services     Post Acute Care Choice: [place] arrangements for the past 2 months: Single Family Home                                       Social Determinants of Health (SDOH) Interventions SDOH Screenings   Tobacco Use: Medium Risk (05/11/2022)    Readmission Risk Interventions     No data to display

## 2022-05-17 NOTE — Progress Notes (Signed)
Physical Therapy Treatment Patient Details Name: Edward Maynard MRN: [id number] DOB: [birth year] Today's Date: 05/17/2022   History of Present Illness Pt is an 89 y.o. male who presented 05/10/22 with L-sided weakness and a fall. Outside window for tPA. 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 PMH: A-fib on Xarelto, HLD, CVA in 05/2013    PT Comments    Pt is making good progress with functional mobility, only requiring modA for bed mobility and modAx1 to transfer to stand from EOB using the stedy today, minA to stand from stedy flaps. Pt's L visual field and L extremities sensation deficits result in him needing physical assistance to safely manage his L extremities with all functional mobility. Focused session on progressing his functional mobility independence, strengthening his extremities, and maintaining midline balance with sitting, transfers, and standing. Pt greatly benefits from receiving visual input via a mirror for midline alignment in all positions. He needs max cues and guidance to track and scan for objects to his L. Encouraged pt's wife to sit on his L to facilitate increased L side attention and visual scanning. Will continue to follow acutely. Current recommendations remain appropriate.     Recommendations for follow up therapy are one component of a multi-disciplinary discharge planning process, led by the attending physician.  Recommendations may be updated based on patient status, additional functional criteria and insurance authorization.  Follow Up Recommendations  Acute inpatient rehab (3hours/day)     Assistance Recommended at Discharge Frequent or constant Supervision/Assistance  Patient can return home with the following Two people to help with walking and/or transfers;Two people to help with bathing/dressing/bathroom;Assistance with cooking/housework;Direct supervision/assist for medications  management;Direct supervision/assist for financial management;Assist for transportation;Help with stairs or ramp for entrance;Assistance with feeding   Equipment Recommendations  Other (comment) (defer to AIR)    Recommendations for Other Services       Precautions / Restrictions Precautions Precautions: Fall Precaution Comments: Left hemi; Left homonymous hemianopsia Restrictions Weight Bearing Restrictions: No     Mobility  Bed Mobility Overal bed mobility: Needs Assistance Bed Mobility: Rolling, Sidelying to Sit, Sit to Supine Rolling: Mod assist Sidelying to sit: Mod assist, HOB elevated   Sit to supine: Mod assist, HOB elevated   General bed mobility comments: PT helping position L UE to ensure safety with rolling to L and pushing up on L arm to sit up L EOB. ModA to lift legs onto bed to return to supine and direct trunk to midline.    Transfers Overall transfer level: Needs assistance Equipment used: Ambulation equipment used Transfers: Sit to/from Stand Sit to Stand: Min assist, Mod assist           General transfer comment: Pt needing physical assistance for trunk stability and lifting to place L foot on stedy platform for set-up. Needs cues and guidance for safe L hand placement on stedy bar. ModA to power up to stand from EOB to stedy 1x, minA from stedy flaps >5x. Transfer via Lift Equipment: Stedy  Ambulation/Gait               General Gait Details: unable   Marine scientist Rankin (Stroke Patients Only) Modified Rankin (Stroke Patients Only) Pre-Morbid Rankin Score: No symptoms Modified Rankin: Severe disability     Balance Overall balance assessment: Needs assistance Sitting-balance support: Bilateral upper  extremity supported, Feet supported, Single extremity supported Sitting balance-Leahy Scale: Poor Sitting balance - Comments: pt able to maintain static sitting EOB with close supervision,  improvement even from yesterday, occasional min A to prevent excessive fwd lean. Initially demonstrating L lateral lean, but pt corrected with L hand being guided by PT to be placed on bed to push up and with visual input from mirror anterior to him. Min-modA to prevent LOB with dynamic sitting. Postural control: Left lateral lean Standing balance support: Single extremity supported, Bilateral upper extremity supported, Reliant on assistive device for balance, During functional activity Standing balance-Leahy Scale: Poor Standing balance comment: Reliant on UE support and min-modA for static standing balance in stedy                            Cognition Arousal/Alertness: Awake/alert Behavior During Therapy: WFL for tasks assessed/performed Overall Cognitive Status: Impaired/Different from baseline Area of Impairment: Attention, Awareness, Problem solving, Memory, Following commands, Safety/judgement                   Current Attention Level: Sustained Memory: Decreased short-term memory Following Commands: Follows one step commands with increased time, Follows one step commands consistently Safety/Judgement: Decreased awareness of safety, Decreased awareness of deficits Awareness: Emergent Problem Solving: Slow processing, Difficulty sequencing, Requires verbal cues General Comments: Pt needing cues to turn head and eyes to L to attend to objects or his own extremities on his L, often neglecting that side with poor awareness of L extremity protection. Able to detect which way he is leaning off midline, especially when provided visual input from a mirror, and correct it with min cues.        Exercises General Exercises - Lower Extremity Straight Leg Raises: Strengthening, Left, 5 reps, Supine Other Exercises Other Exercises: sit <> stand >6x in stedy with pt watching self in mirror to facilitate midline positioning throughout transfers Other Exercises: visual scanning  to find numbers written on mirror in all directions, pt standing vs sitting throughout    General Comments General comments (skin integrity, edema, etc.): encouraged wife to sit on pt's L to facilitate pt scanning to the L more; encouraged SLR with knee extended on L as HEP throughout day, wife verbalized understanding      Pertinent Vitals/Pain Pain Assessment Pain Assessment: Faces Faces Pain Scale: Hurts a little bit Pain Location: back (itchy) Pain Descriptors / Indicators: Other (Comment), Discomfort ("itchy") Pain Intervention(s): Monitored during session, Other (comment) (notified RN of red spots on pt's back and his report of feeling "itchy")    Home Living                          Prior Function            PT Goals (current goals can now be found in the care plan section) Acute Rehab PT Goals Patient Stated Goal: to improve PT Goal Formulation: With patient/family Time For Goal Achievement: 05/24/22 Potential to Achieve Goals: Good Progress towards PT goals: Progressing toward goals    Frequency    Min 4X/week      PT Plan Current plan remains appropriate    Co-evaluation              AM-PAC PT "6 Clicks" Mobility   Outcome Measure  Help needed turning from your back to your side while in a flat bed without using bedrails?: A Lot Help  needed moving from lying on your back to sitting on the side of a flat bed without using bedrails?: A Lot Help needed moving to and from a bed to a chair (including a wheelchair)?: Total Help needed standing up from a chair using your arms (e.g., wheelchair or bedside chair)?: A Lot Help needed to walk in hospital room?: Total Help needed climbing 3-5 steps with a railing? : Total 6 Click Score: 9    End of Session Equipment Utilized During Treatment: Gait belt Activity Tolerance: Patient tolerated treatment well Patient left: with call bell/phone within reach;with family/visitor present;in bed;with bed  alarm set   PT Visit Diagnosis: Unsteadiness on feet (R26.81);Other abnormalities of gait and mobility (R26.89);Muscle weakness (generalized) (M62.81);Difficulty in walking, not elsewhere classified (R26.2);Other symptoms and signs involving the nervous system (R29.898);Hemiplegia and hemiparesis Hemiplegia - Right/Left: Left Hemiplegia - dominant/non-dominant: Non-dominant Hemiplegia - caused by: Cerebral infarction     Time: [id number] PT Time Calculation (min) (ACUTE ONLY): 40 min  Charges:  $Therapeutic Exercise: 8-22 mins $Therapeutic Activity: 8-22 mins $Neuromuscular Re-education: 8-22 mins                     Edward Maynard, PT, DPT Acute Rehabilitation Services  Office: [phone]    Orvan Falconer 05/17/2022, 5:00 PM

## 2022-05-17 NOTE — Progress Notes (Addendum)
STROKE TEAM PROGRESS NOTE   SUBJECTIVE (INTERVAL HISTORY)  Daughter, Edward Maynard, at bedside.  Assessment and plan of care discussed. VSS  Neurological exam with slight improvement from yesterday  Daughter states they want to pursue CIR., now awaiting insurance authorization   OBJECTIVE Temp:  [97.7 F (36.5 C)-98.8 F (37.1 C)] 98.5 F (36.9 C) (02/28 1110) Pulse Rate:  [78-82] 82 (02/28 1110) Cardiac Rhythm: Heart block (02/28 0859) Resp:  [18] 18 (02/28 1110) BP: (136-160)/(55-85) 147/76 (02/28 1110) SpO2:  [92 %-99 %] 96 % (02/28 1110)  No results for input(s): "GLUCAP" in the last 168 hours.  Recent Labs  Lab 05/11/22 1246 05/12/22 0631 05/14/22 0533 05/16/22 0625  NA 135 137 135 138  K 3.9 3.8 4.0 3.7  CL 103 102 101 107  CO2 '27 23 25 '$ 21*  GLUCOSE 123* 122* 111* 118*  BUN 19 19 26* 27*  CREATININE 1.13 1.03 1.26* 1.04  CALCIUM 8.5* 8.7* 9.0 8.7*  PHOS 2.4*  --   --   --     Recent Labs  Lab 05/11/22 1246  ALBUMIN 3.5    Recent Labs  Lab 05/11/22 1246 05/12/22 0631 05/14/22 0533 05/16/22 0625  WBC 8.7 7.4 7.0 7.1  HGB 13.7 14.5 15.7 15.3  HCT 39.4 43.0 44.1 44.2  MCV 97.3 98.2 95.7 96.1  PLT 144* 146* 163 171    No results for input(s): "CKTOTAL", "CKMB", "CKMBINDEX", "TROPONINI" in the last 168 hours. No results for input(s): "LABPROT", "INR" in the last 72 hours.  No results for input(s): "COLORURINE", "LABSPEC", "PHURINE", "GLUCOSEU", "HGBUR", "BILIRUBINUR", "KETONESUR", "PROTEINUR", "UROBILINOGEN", "NITRITE", "LEUKOCYTESUR" in the last 72 hours.  Invalid input(s): "APPERANCEUR"     Component Value Date/Time   CHOL 134 05/10/2022 0545   TRIG 190 (H) 05/10/2022 0545   HDL 40 (L) 05/10/2022 0545   CHOLHDL 3.4 05/10/2022 0545   VLDL 38 05/10/2022 0545   LDLCALC 56 05/10/2022 0545   Lab Results  Component Value Date   HGBA1C 5.8 (H) 05/10/2022   No results found for: "LABOPIA", "COCAINSCRNUR", "LABBENZ", "AMPHETMU", "THCU", "LABBARB"  No  results for input(s): "ETH" in the last 168 hours.   I have personally reviewed the radiological images below and agree with the radiology interpretations.  CT HEAD WO CONTRAST (5MM)  Result Date: 05/16/2022 CLINICAL DATA:  Stroke follow-up EXAM: CT HEAD WITHOUT CONTRAST TECHNIQUE: Contiguous axial images were obtained from the base of the skull through the vertex without intravenous contrast. RADIATION DOSE REDUCTION: This exam was performed according to the departmental dose-optimization program which includes automated exposure control, adjustment of the mA and/or kV according to patient size and/or use of iterative reconstruction technique. COMPARISON:  05/10/2022 FINDINGS: Brain: Known acute infarct in the posterior division of the right MCA territory with petechial hemorrhage. No hematoma or malignant swelling. Chronic bilateral cerebellar and right thalamic infarcts. No hemorrhage, hydrocephalus, or masslike finding. Vascular: No new abnormality Skull: Negative Sinuses/Orbits: Negative Other: Partially covered right parotid mass measuring 15 mm. IMPRESSION: 1. No new abnormality. Large right posterior division MCA territory infarct with mild petechial hemorrhage. 2. Chronic cerebellar and thalamic small vessel infarcts. 3. 15 mm right parotid mass. Electronically Signed   By: Jorje Guild M.D.   On: 05/16/2022 07:03   DG Swallowing Func-Speech Pathology  Result Date: 05/11/2022 Table formatting from the original result was not included. Modified Barium Swallow Study Patient Details Name: Edward Maynard MRN: [id number] Date of Birth: 09-Aug-1933 Today's Date: 05/11/2022 HPI/PMH: HPI: 89 year old male  with atrial fibrillation (on Xarelto), prior stroke, presenting with acute onset of left hemiplegia and left hemineglect after sustaining a fall to the left while at home this evening. Underwent thrombectomy Clinical Impression: Clinical Impression: Pt demonstrates moderate to severe oral dysphagia witn  left CN VII weakness and sensory impairment leading to severe anterior spillage with cup sips of liquids. Lingual, buccal and palatal residue present with most trials. Thin residue spills to pharynx post swallow. There is delayed swallow initaition with all liquids. No aspiration of thin occurred until pt attempted consecutive straw sips (PAS 7); however, there is higher risk with thin and severe anterior spillage. Nectar was better controlled orally and with a straw, which reduced oral residue and spillage. Pt unable to masticate solids without dentures and lingual manipulation of bolus very poor. Recommend purees and nectar thick liquids while pt learns strategies and improves awareness. Can quickly upgrade to thin as pt demonstrates awareness and control without severe coughing Factors that may increase risk of adverse event in presence of aspiration ([place] 2021): Factors that may increase risk of adverse event in presence of aspiration ([place] 2021): Limited mobility; Reduced cognitive function Recommendations/Plan: Swallowing Evaluation Recommendations Swallowing Evaluation Recommendations Liquid Administration via: Straw; Cup Medication Administration: Whole meds with puree Supervision: Full supervision/cueing for swallowing strategies Swallowing strategies  : Slow rate; Small bites/sips; Minimize environmental distractions; Check for anterior loss Postural changes: Stay upright 30-60 min after meals Treatment Plan Treatment Plan Treatment recommendations: Therapy as outlined in treatment plan below Follow-up recommendations: Acute inpatient rehab (3 hours/day) Functional status assessment: Patient has had a recent decline in their functional status and demonstrates the ability to make significant improvements in function in a reasonable and predictable amount of time. Treatment frequency: Min 2x/week Treatment duration: 2 weeks Interventions: Aspiration precaution training; Diet  toleration management by SLP; Trials of upgraded texture/liquids; Patient/family education; Compensatory techniques Recommendations Recommendations for follow up therapy are one component of a multi-disciplinary discharge planning process, led by the attending physician.  Recommendations may be updated based on patient status, additional functional criteria and insurance authorization. Assessment: Orofacial Exam: Orofacial Exam Oral Cavity - Dentition: Dentures, top; Dentures, bottom Anatomy: Anatomy: WFL Thin Liquids: Thin Liquids (Level 0) Thin Liquids : Impaired Bolus delivery method: Cup Thin Liquid - Impairment: Oral Impairment; Pharyngeal impairment Lip Closure: Escape beyond mid-chin Tongue control during bolus hold: Escape to lateral buccal cavity/floor of mouth Bolus transport/lingual motion: Repetitive/disorganized tongue motion Oral residue: Residue collection on oral structures Location of oral residue : Floor of mouth; Lateral sulci; Tongue Initiation of swallow : Pyriform sinuses Soft palate elevation: No bolus between soft palate (SP)/pharyngeal wall (PW) Laryngeal elevation: Complete superior movement of thyroid cartilage with complete approximation of arytenoids to epiglottic petiole Anterior hyoid excursion: Complete Epiglottic movement: Complete Laryngeal vestibule closure: Complete, no air/contrast in laryngeal vestibule Pharyngeal stripping wave : Present - complete Pharyngeal contraction (A/P view only): N/A Pharyngoesophageal segment opening: Complete distension and complete duration, no obstruction of flow Tongue base retraction: Trace column of contrast or air between tongue base and PPW Pharyngeal residue: Trace residue within or on pharyngeal structures Location of pharyngeal residue: Valleculae Penetration/Aspiration Scale (PAS) score: 7.  Material enters airway, passes BELOW cords and not ejected out despite cough attempt by patient; 1.  Material does not enter airway (aspiration with  straw only)  Mildly Thick Liquids: Mildly thick liquids (Level 2, nectar thick) Mildly thick liquids (Level 2, nectar thick): Impaired Bolus delivery method: Cup Mildly Thick  Liquid - Impairment: Oral Impairment; Pharyngeal impairment Lip Closure: Escape beyond mid-chin Tongue control during bolus hold: Escape to lateral buccal cavity/floor of mouth Bolus transport/lingual motion: Repetitive/disorganized tongue motion Oral residue: Residue collection on oral structures Location of oral residue : Lateral sulci; Floor of mouth Initiation of swallow : Pyriform sinuses Soft palate elevation: No bolus between soft palate (SP)/pharyngeal wall (PW) Laryngeal elevation: Complete superior movement of thyroid cartilage with complete approximation of arytenoids to epiglottic petiole Anterior hyoid excursion: Complete Epiglottic movement: Complete Laryngeal vestibule closure: Complete, no air/contrast in laryngeal vestibule Pharyngeal stripping wave : Present - complete Pharyngeal contraction (A/P view only): Complete; N/A Pharyngoesophageal segment opening: Complete distension and complete duration, no obstruction of flow Tongue base retraction: Trace column of contrast or air between tongue base and PPW Pharyngeal residue: Trace residue within or on pharyngeal structures Location of pharyngeal residue: Valleculae  Moderately Thick Liquids: Moderately thick liquids (Level 3, honey thick) Moderately thick liquids (Level 3, honey thick): Impaired Bolus delivery method: Spoon Moderately Thick Liquid - Impairment: Oral Impairment Lip Closure: Escape from interlabial space or lateral juncture, no extension beyond vermillion border Tongue control during bolus hold: Escape to lateral buccal cavity/floor of mouth Bolus transport/lingual motion: Repetitive/disorganized tongue motion Oral residue: Residue collection on oral structures Location of oral residue : Floor of mouth; Lateral sulci Initiation of swallow : Posterior laryngeal  surface of the epiglottis  Puree: Puree Puree: Impaired Puree - Impairment: Oral Impairment Lip Closure: Interlabial escape, no progression to anterior lip Bolus transport/lingual motion: Repetitive/disorganized tongue motion Oral residue: Trace residue lining oral structures Location of oral residue : Tongue Initiation of swallow: Valleculae Solid: Solid Solid: Impaired Solid - Impairment: Oral Impairment Lip Closure: Escape beyond mid-chin Bolus preparation/mastication: Minimal chewing/mashing with majority of bolus unchewed Bolus transport/lingual motion: Repetitive/disorganized tongue motion Oral residue: Majority of bolus remaining Location of oral residue : Lateral sulci Pill: Pill Pill: Not Tested Compensatory Strategies: Compensatory Strategies Compensatory strategies: Yes Straw: Effective Effective Straw: Mildly thick liquid (Level 2, nectar thick) (aspiration with thin)   General Information: Caregiver present: No  Diet Prior to this Study: NPO   No data recorded  No data recorded  Supplemental O2: None (Room air)   History of Recent Intubation: No  Behavior/Cognition: Alert; Cooperative; Pleasant mood No data recorded Baseline vocal quality/speech: Normal Volitional Cough: Able to elicit Volitional Swallow: Able to elicit No data recorded Goal Planning: Prognosis for improved oropharyngeal function: Good Barriers to Reach Goals: Cognitive deficits No data recorded No data recorded Consulted and agree with results and recommendations: Patient; Family member/caregiver Pain: Pain Assessment Pain Assessment: Faces Faces Pain Scale: 0 Pain Intervention(s): Monitored during session End of Session: Start Time:SLP Start Time (ACUTE ONLY): 1345 Stop Time: SLP Stop Time (ACUTE ONLY): 1405 Time Calculation:SLP Time Calculation (min) (ACUTE ONLY): 20 min Charges: SLP Evaluations $ SLP Speech Visit: 1 Visit SLP Evaluations $BSS Swallow: 1 Procedure $MBS Swallow: 1 Procedure $ SLP EVAL LANGUAGE/SOUND PRODUCTION: 1  Procedure $Swallowing Treatment: 1 Procedure $Speech Treatment for Individual: 1 Procedure SLP visit diagnosis: SLP Visit Diagnosis: Dysphagia, oropharyngeal phase (R13.12) Past Medical History: Past Medical History: Diagnosis Date  High cholesterol   05/2013; no defecits  Hyperlipemia   Left arm weakness   Left hand weakness   Personal history of colonic polyp - adenoma  10/23/2013  Stroke ([place]) 05/2013  MRI on 07/02/13 = Multifocal acute & subacute infarction involving right frontal MCA/ACA & left parietal MCA/PCA watershed areas Past Surgical History: Past Surgical History:  Procedure Laterality Date  IR CT HEAD LTD  05/10/2022  IR PERCUTANEOUS ART THROMBECTOMY/INFUSION INTRACRANIAL INC DIAG ANGIO  05/10/2022  LOOP RECORDER IMPLANT N/A 10/31/2013  Procedure: LOOP RECORDER IMPLANT;  Surgeon: Coralyn Mark, MD;  Location: [facility] CATH [facility];  Service: Cardiovascular;  Laterality: N/A;  RADIOLOGY WITH ANESTHESIA N/A 05/10/2022  Procedure: IR WITH ANESTHESIA;  Surgeon: Luanne Bras, MD;  Location: [facility];  Service: Radiology;  Laterality: N/A;  SHOULDER SURGERY Bilateral 2003, 1995 [place], Katherene Ponto 05/11/2022, 2:20 PM  IR PERCUTANEOUS ART THROMBECTOMY/INFUSION INTRACRANIAL INC DIAG ANGIO  Result Date: 05/11/2022 INDICATION: Worsening left-sided weakness and right gaze deviation. Occluded distal M2 segment of the inferior division of the right middle cerebral artery on CT angiogram of the head and neck. EXAM: 1. EMERGENT LARGE VESSEL OCCLUSION THROMBOLYSIS anterior CIRCULATION) COMPARISON:  CT angiogram of the head and neck of May 10, 2022. MEDICATIONS: Ancef 2 g IV antibiotic was administered within 1 hour of the procedure. ANESTHESIA/SEDATION: General anesthesia. CONTRAST:  Omnipaque 300 approximately 70 mL. FLUOROSCOPY TIME:  Fluoroscopy Time: 41 minutes 16 seconds (2313 mGy). COMPLICATIONS: None immediate. TECHNIQUE: Following a full explanation of the procedure along with the potential associated  complications, an informed witnessed consent was obtained. The risks of intracranial hemorrhage of 10%, worsening neurological deficit, ventilator dependency, death and inability to revascularize were all reviewed in detail with the patient's spouse. The patient was then put under general anesthesia by the Department of Anesthesiology at [facility]. The right groin was prepped and draped in the usual sterile fashion. Thereafter using modified Seldinger technique, transfemoral access into the right common femoral artery was obtained without difficulty. Over a 0.035 inch guidewire an 8 French 25 cm Pinnacle sheath was inserted. Through this a combination of a 125 cm 5 [place] Berenstein support catheter inside of an 087 95 cm balloon guide catheter was advanced initially proximal to the right common carotid bifurcation and then into the distal right internal carotid artery. FINDINGS: The right common carotid bifurcation demonstrates the right external carotid artery and its major branches to be widely patent. The right internal carotid artery at the bulb demonstrates a smooth shallow plaque along the posterior wall associated with 10% stenosis by the NASCET criteria. Distal to this the right internal carotid artery demonstrates opacification to the cranial skull base. The petrous, the cavernous and the supraclinoid right ICA demonstrate wide patency. A right posterior communicating artery is seen opacifying the right posterior cerebral artery distribution. The right anterior cerebral artery opacifies into the capillary and venous phases. The right middle cerebral artery M1 segment is widely patent. There is an occlusion of a parietal branch of the inferior division of the right middle cerebral artery in the distal M3 region. Delayed arterial and capillary phase demonstrates significant retrograde opacification of the distal right parietal cortical and subcortical distribution from the anterior cerebral and the  P3 leptomeningeal branches of the right posterior cerebral artery. PROCEDURE: Through the balloon guide catheter in the distal right internal carotid artery, a combination of an 045 Zoom aspiration catheter with a 160 cm Trevo microcatheter was advanced over an 014 inch soft tip micro guidewire with a moderate J configuration to the supraclinoid right ICA. The micro guidewire was then gently manipulated with a torque device and advanced without difficulty distal to the occluded branch of the inferior division into the M3 region followed by the microcatheter. A 3 mm x 20 mm Solitaire X retrieval device was then deployed following verification of safe positioning  of the tip of the microcatheter. This was then followed by the advancement of the Zoom aspiration catheter which was engaged just inside the occluded portion of the vessel, the micro guidewire and the microcatheter were retrieved proximally. Following 2 minutes of contact aspiration with a Penumbra aspiration device and proximal flow arrest, the Zoom aspiration catheter was removed. Two approximately 1 mm pearly sticky clots were retrieved. A control arteriogram demonstrated no significant change in the occluded parietal branch. A second pass was then made using the above combination. The microcatheter was advanced past the occlusion into the M3 region with the micro guidewire. The guidewire was removed. Good aspiration obtained from the hub of the microcatheter which was then connected to continuous heparinized saline infusion. The Zoom aspiration catheter was then engaged just inside the occluded segment of the vessel. A 3 mm x 20 mm Solitaire X retrieval device was then deployed in the usual manner. Thereafter, constant aspiration was applied with proximal flow arrest the hub of the Zoom aspiration catheter for a minute and a half. Following this the combination of the retrieval device, the microcatheter and the aspiration catheter were removed. A control  arteriogram performed through the balloon guide catheter following reversal of flow arrest now demonstrated improved flow through the occluded branch though slow to the M3 M4 regions. A third pass was then made using a combination using an 035 inch aspiration catheter advanced over an 014 inch soft tip micro guidewire with a moderate J configuration to the right middle cerebral artery. Access into the now reoccluded inferior division M2 branch was then made with the micro guidewire followed by the microcatheter into the distal M3 region. The guidewire was removed. The 035 inch Zoom aspiration was then engaged into the clot with no aspiration obtained. Aspiration was then applied with a Penumbra aspiration device for approximately 2 minutes with proximal flow arrest. The aspiration catheter was removed. A control arteriogram performed through the balloon guide catheter demonstrated mild improvement in revascularization of the occluded branch with the clot noted slightly more distally. A fourth pass was then made this time again using an 045 Zoom aspiration catheter advanced with an 021 microcatheter and over 014 inch micro guidewire. This combination was then advanced into the M3 region of the occluded branch followed by the microcatheter. The guidewire was removed. Good aspiration obtained from the hub of the microcatheter which was positioned distally in the M3 segment. This was then connected to continuous heparinized saline infusion. A 4 mm x 40 mm Solitaire X retrieval stent was then deployed in the usual manner with the 045 Zoom aspiration catheter imbedded in the occluded branch. Aspiration was then for approximately 2 minutes at the hub of the aspiration catheter with proximal flow arrest. Thereafter, the retriever, the microcatheter and the Zoom aspiration catheter were removed. A control arteriogram performed following reversal of flow arrest in the right internal carotid artery, continued to demonstrate  slow antegrade flow distal to the occluded segment. A control arteriogram performed through the balloon guide catheter continued to demonstrate a TICI 2C over revascularization with only mild distal flow past the occluded M2 branch of the right inferior division MCA. No further passes were made due to now steadily increased risk of vessel injury perforation. The balloon guide was retrieved in the right common carotid artery. A control arteriogram performed through this continued to demonstrate patency of right internal carotid artery proximally and distally including that of the right posterior communicating artery, the right anterior cerebral  artery and the right middle cerebral artery distributions. An 8 French Angio-Seal closure device was then deployed in the right groin puncture site for hemostasis. Distal pulses remained Dopplerable in both feet unchanged from prior to the procedure. A flat panel CT of the brain demonstrated no evidence of intra cerebral hemorrhage. The patient's general anesthesia was reversed, and patient was extubated. Initially agitated, the patient gradually settled down to where simple commands appropriately and verbalized appropriately. The patient moved his left lower extremities spontaneously and to command with good strength. Slight movement was evident in the left upper extremity. The patient was then transferred to the neuro ICU for post revascularization management. IMPRESSION: Status post endovascular revascularization of occluded distal M2 branch of the right middle cerebral artery inferior division with 2 passes with a 3 mm x 20 mm retrieval device and contact aspiration, 1 pass with a 4 mm x 40 mm Solitaire X retrieval device and contact aspiration, 1 pass with contact aspiration with minimal improvement in revascularization of the occluded right MCA inferior division M2 segment occlusion. A TICI 2C revascularization was maintained. PLAN: Follow-up as per referring MD.  Electronically Signed   By: Luanne Bras M.D.   On: 05/11/2022 08:28   IR CT Head Ltd  Result Date: 05/11/2022 INDICATION: Worsening left-sided weakness and right gaze deviation. Occluded distal M2 segment of the inferior division of the right middle cerebral artery on CT angiogram of the head and neck. EXAM: 1. EMERGENT LARGE VESSEL OCCLUSION THROMBOLYSIS anterior CIRCULATION) COMPARISON:  CT angiogram of the head and neck of May 10, 2022. MEDICATIONS: Ancef 2 g IV antibiotic was administered within 1 hour of the procedure. ANESTHESIA/SEDATION: General anesthesia. CONTRAST:  Omnipaque 300 approximately 70 mL. FLUOROSCOPY TIME:  Fluoroscopy Time: 41 minutes 16 seconds (2313 mGy). COMPLICATIONS: None immediate. TECHNIQUE: Following a full explanation of the procedure along with the potential associated complications, an informed witnessed consent was obtained. The risks of intracranial hemorrhage of 10%, worsening neurological deficit, ventilator dependency, death and inability to revascularize were all reviewed in detail with the patient's spouse. The patient was then put under general anesthesia by the Department of Anesthesiology at [facility]. The right groin was prepped and draped in the usual sterile fashion. Thereafter using modified Seldinger technique, transfemoral access into the right common femoral artery was obtained without difficulty. Over a 0.035 inch guidewire an 8 French 25 cm Pinnacle sheath was inserted. Through this a combination of a 125 cm 5 [place] Berenstein support catheter inside of an 087 95 cm balloon guide catheter was advanced initially proximal to the right common carotid bifurcation and then into the distal right internal carotid artery. FINDINGS: The right common carotid bifurcation demonstrates the right external carotid artery and its major branches to be widely patent. The right internal carotid artery at the bulb demonstrates a smooth shallow plaque along  the posterior wall associated with 10% stenosis by the NASCET criteria. Distal to this the right internal carotid artery demonstrates opacification to the cranial skull base. The petrous, the cavernous and the supraclinoid right ICA demonstrate wide patency. A right posterior communicating artery is seen opacifying the right posterior cerebral artery distribution. The right anterior cerebral artery opacifies into the capillary and venous phases. The right middle cerebral artery M1 segment is widely patent. There is an occlusion of a parietal branch of the inferior division of the right middle cerebral artery in the distal M3 region. Delayed arterial and capillary phase demonstrates significant retrograde opacification of the distal  right parietal cortical and subcortical distribution from the anterior cerebral and the P3 leptomeningeal branches of the right posterior cerebral artery. PROCEDURE: Through the balloon guide catheter in the distal right internal carotid artery, a combination of an 045 Zoom aspiration catheter with a 160 cm Trevo microcatheter was advanced over an 014 inch soft tip micro guidewire with a moderate J configuration to the supraclinoid right ICA. The micro guidewire was then gently manipulated with a torque device and advanced without difficulty distal to the occluded branch of the inferior division into the M3 region followed by the microcatheter. A 3 mm x 20 mm Solitaire X retrieval device was then deployed following verification of safe positioning of the tip of the microcatheter. This was then followed by the advancement of the Zoom aspiration catheter which was engaged just inside the occluded portion of the vessel, the micro guidewire and the microcatheter were retrieved proximally. Following 2 minutes of contact aspiration with a Penumbra aspiration device and proximal flow arrest, the Zoom aspiration catheter was removed. Two approximately 1 mm pearly sticky clots were retrieved. A  control arteriogram demonstrated no significant change in the occluded parietal branch. A second pass was then made using the above combination. The microcatheter was advanced past the occlusion into the M3 region with the micro guidewire. The guidewire was removed. Good aspiration obtained from the hub of the microcatheter which was then connected to continuous heparinized saline infusion. The Zoom aspiration catheter was then engaged just inside the occluded segment of the vessel. A 3 mm x 20 mm Solitaire X retrieval device was then deployed in the usual manner. Thereafter, constant aspiration was applied with proximal flow arrest the hub of the Zoom aspiration catheter for a minute and a half. Following this the combination of the retrieval device, the microcatheter and the aspiration catheter were removed. A control arteriogram performed through the balloon guide catheter following reversal of flow arrest now demonstrated improved flow through the occluded branch though slow to the M3 M4 regions. A third pass was then made using a combination using an 035 inch aspiration catheter advanced over an 014 inch soft tip micro guidewire with a moderate J configuration to the right middle cerebral artery. Access into the now reoccluded inferior division M2 branch was then made with the micro guidewire followed by the microcatheter into the distal M3 region. The guidewire was removed. The 035 inch Zoom aspiration was then engaged into the clot with no aspiration obtained. Aspiration was then applied with a Penumbra aspiration device for approximately 2 minutes with proximal flow arrest. The aspiration catheter was removed. A control arteriogram performed through the balloon guide catheter demonstrated mild improvement in revascularization of the occluded branch with the clot noted slightly more distally. A fourth pass was then made this time again using an 045 Zoom aspiration catheter advanced with an 021 microcatheter  and over 014 inch micro guidewire. This combination was then advanced into the M3 region of the occluded branch followed by the microcatheter. The guidewire was removed. Good aspiration obtained from the hub of the microcatheter which was positioned distally in the M3 segment. This was then connected to continuous heparinized saline infusion. A 4 mm x 40 mm Solitaire X retrieval stent was then deployed in the usual manner with the 045 Zoom aspiration catheter imbedded in the occluded branch. Aspiration was then for approximately 2 minutes at the hub of the aspiration catheter with proximal flow arrest. Thereafter, the retriever, the microcatheter and the Zoom aspiration  catheter were removed. A control arteriogram performed following reversal of flow arrest in the right internal carotid artery, continued to demonstrate slow antegrade flow distal to the occluded segment. A control arteriogram performed through the balloon guide catheter continued to demonstrate a TICI 2C over revascularization with only mild distal flow past the occluded M2 branch of the right inferior division MCA. No further passes were made due to now steadily increased risk of vessel injury perforation. The balloon guide was retrieved in the right common carotid artery. A control arteriogram performed through this continued to demonstrate patency of right internal carotid artery proximally and distally including that of the right posterior communicating artery, the right anterior cerebral artery and the right middle cerebral artery distributions. An 8 French Angio-Seal closure device was then deployed in the right groin puncture site for hemostasis. Distal pulses remained Dopplerable in both feet unchanged from prior to the procedure. A flat panel CT of the brain demonstrated no evidence of intra cerebral hemorrhage. The patient's general anesthesia was reversed, and patient was extubated. Initially agitated, the patient gradually settled down to  where simple commands appropriately and verbalized appropriately. The patient moved his left lower extremities spontaneously and to command with good strength. Slight movement was evident in the left upper extremity. The patient was then transferred to the neuro ICU for post revascularization management. IMPRESSION: Status post endovascular revascularization of occluded distal M2 branch of the right middle cerebral artery inferior division with 2 passes with a 3 mm x 20 mm retrieval device and contact aspiration, 1 pass with a 4 mm x 40 mm Solitaire X retrieval device and contact aspiration, 1 pass with contact aspiration with minimal improvement in revascularization of the occluded right MCA inferior division M2 segment occlusion. A TICI 2C revascularization was maintained. PLAN: Follow-up as per referring MD. Electronically Signed   By: Luanne Bras M.D.   On: 05/11/2022 08:28   MR BRAIN WO CONTRAST  Result Date: 05/10/2022 CLINICAL DATA:  Stroke, follow up EXAM: MRI HEAD WITHOUT CONTRAST MRA HEAD WITHOUT CONTRAST TECHNIQUE: Multiplanar, multi-echo pulse sequences of the brain and surrounding structures were acquired without intravenous contrast. Angiographic images of the Circle of Willis were acquired using MRA technique without intravenous contrast. COMPARISON:  CTA and CT head from same day. FINDINGS: MRI HEAD FINDINGS Motion limited study. Brain: Acute posterior right MCA territory infarct. Associated edema and regional mass effect without significant midline shift. No mass occupying acute hemorrhage. No mass lesion. No hydrocephalus. Remote infarct in the right cerebellum. Vascular: Detailed below. Skull and upper cervical spine: Normal marrow signal. Sinuses/Orbits: Clear sinuses.  No acute orbital findings. MRA HEAD FINDINGS Severely motion limited study. Anterior circulation: Bilateral intracranial ICAs, proximal ACAs and proximal M1 MCAs appear grossly patent. Essentially nondiagnostic  evaluation of the M2 MCAs, including the region of occlusion seen on recent CTA. Posterior circulation: The intradural vertebral arteries, basilar arteries and proximal PCAs are grossly patent. IMPRESSION: 1. Acute right posterior MCA territory infarct. Associated edema without midline shift. 2. Severely motion limited MRA with nondiagnostic evaluation of the right M2 MCA in the region of recently seen occlusion on CTA. A repeat exclude exam may be able to better assess if the patient is able in clinically warranted. Electronically Signed   By: Margaretha Sheffield M.D.   On: 05/10/2022 16:03   MR ANGIO HEAD WO CONTRAST  Result Date: 05/10/2022 CLINICAL DATA:  Stroke, follow up EXAM: MRI HEAD WITHOUT CONTRAST MRA HEAD WITHOUT CONTRAST TECHNIQUE: Multiplanar, multi-echo pulse  sequences of the brain and surrounding structures were acquired without intravenous contrast. Angiographic images of the Circle of Willis were acquired using MRA technique without intravenous contrast. COMPARISON:  CTA and CT head from same day. FINDINGS: MRI HEAD FINDINGS Motion limited study. Brain: Acute posterior right MCA territory infarct. Associated edema and regional mass effect without significant midline shift. No mass occupying acute hemorrhage. No mass lesion. No hydrocephalus. Remote infarct in the right cerebellum. Vascular: Detailed below. Skull and upper cervical spine: Normal marrow signal. Sinuses/Orbits: Clear sinuses.  No acute orbital findings. MRA HEAD FINDINGS Severely motion limited study. Anterior circulation: Bilateral intracranial ICAs, proximal ACAs and proximal M1 MCAs appear grossly patent. Essentially nondiagnostic evaluation of the M2 MCAs, including the region of occlusion seen on recent CTA. Posterior circulation: The intradural vertebral arteries, basilar arteries and proximal PCAs are grossly patent. IMPRESSION: 1. Acute right posterior MCA territory infarct. Associated edema without midline shift. 2.  Severely motion limited MRA with nondiagnostic evaluation of the right M2 MCA in the region of recently seen occlusion on CTA. A repeat exclude exam may be able to better assess if the patient is able in clinically warranted. Electronically Signed   By: Margaretha Sheffield M.D.   On: 05/10/2022 16:03   ECHOCARDIOGRAM COMPLETE  Result Date: 05/10/2022    ECHOCARDIOGRAM REPORT   Patient Name:   DAWUD KOVACEVICH Date of Exam: 05/10/2022 Medical Rec #:  [id number]     Height:       71.5 in Accession #:    [id number]    Weight:       223.1 lb Date of Birth:  09-04-1933    BSA:          2.220 m Patient Age:    67 years      BP:           160/90 mmHg Patient Gender: M             HR:           73 bpm. Exam Location:  Inpatient Procedure: 2D Echo, Cardiac Doppler and Color Doppler Indications:    Stroke I63.9  History:        Patient has prior history of Echocardiogram examinations and                 Patient has no prior history of Echocardiogram examinations.                 Pacemaker, Stroke, Arrythmias:PVC and Tachycardia; Risk                 Factors:Dyslipidemia.  Sonographer:    Ronny Flurry Referring Phys: [phone] ERIC LINDZEN  Sonographer Comments: Image acquisition challenging due to respiratory motion. IMPRESSIONS  1. Left ventricular ejection fraction, by estimation, is 60 to 65%. The left ventricle has normal function. The left ventricle has no regional wall motion abnormalities. Left ventricular diastolic parameters are consistent with Grade II diastolic dysfunction (pseudonormalization).  2. Right ventricular systolic function is normal. The right ventricular size is normal. There is normal pulmonary artery systolic pressure.  3. The mitral valve is normal in structure. No evidence of mitral valve regurgitation. No evidence of mitral stenosis.  4. The aortic valve is calcified. Aortic valve regurgitation is trivial. Mild aortic valve stenosis. Aortic valve area, by VTI measures 1.55 cm. Aortic valve mean  gradient measures 19.0 mmHg. Aortic valve Vmax measures 2.94 m/s.  5. The inferior vena cava is normal in size with greater  than 50% respiratory variability, suggesting right atrial pressure of 3 mmHg. FINDINGS  Left Ventricle: Left ventricular ejection fraction, by estimation, is 60 to 65%. The left ventricle has normal function. The left ventricle has no regional wall motion abnormalities. The left ventricular internal cavity size was normal in size. There is  no left ventricular hypertrophy. Left ventricular diastolic parameters are consistent with Grade II diastolic dysfunction (pseudonormalization). Indeterminate filling pressures. Right Ventricle: The right ventricular size is normal. No increase in right ventricular wall thickness. Right ventricular systolic function is normal. There is normal pulmonary artery systolic pressure. The tricuspid regurgitant velocity is 1.45 m/s, and  with an assumed right atrial pressure of 3 mmHg, the estimated right ventricular systolic pressure is [id number] mmHg. Left Atrium: Left atrial size was normal in size. Right Atrium: Right atrial size was normal in size. Pericardium: There is no evidence of pericardial effusion. Mitral Valve: The mitral valve is normal in structure. No evidence of mitral valve regurgitation. No evidence of mitral valve stenosis. Tricuspid Valve: The tricuspid valve is normal in structure. Tricuspid valve regurgitation is trivial. No evidence of tricuspid stenosis. Aortic Valve: The aortic valve is calcified. Aortic valve regurgitation is trivial. Mild aortic stenosis is present. Aortic valve mean gradient measures 19.0 mmHg. Aortic valve peak gradient measures 34.5 mmHg. Aortic valve area, by VTI measures 1.55 cm. Pulmonic Valve: The pulmonic valve was normal in structure. Pulmonic valve regurgitation is not visualized. No evidence of pulmonic stenosis. Aorta: The aortic root is normal in size and structure. Venous: The inferior vena cava is normal in  size with greater than 50% respiratory variability, suggesting right atrial pressure of 3 mmHg. IAS/Shunts: No atrial level shunt detected by color flow Doppler.  LEFT VENTRICLE PLAX 2D LVIDd:         4.60 cm   Diastology LVIDs:         3.70 cm   LV e' medial:    7.80 cm/s LV PW:         1.30 cm   LV E/e' medial:  10.4 LV IVS:        1.20 cm   LV e' lateral:   11.60 cm/s LVOT diam:     2.20 cm   LV E/e' lateral: 7.0 LV SV:         89 LV SV Index:   40 LVOT Area:     3.80 cm  RIGHT VENTRICLE             IVC RV S prime:     16.60 cm/s  IVC diam: 2.10 cm TAPSE (M-mode): 1.5 cm LEFT ATRIUM             Index        RIGHT ATRIUM           Index LA diam:        4.60 cm 2.07 cm/m   RA Area:     15.10 cm LA Vol (A2C):   64.7 ml 29.14 ml/m  RA Volume:   31.80 ml  14.32 ml/m LA Vol (A4C):   58.7 ml 26.44 ml/m LA Biplane Vol: 62.9 ml 28.33 ml/m  AORTIC VALVE AV Area (Vmax):    1.61 cm AV Area (Vmean):   1.68 cm AV Area (VTI):     1.55 cm AV Vmax:           293.50 cm/s AV Vmean:          189.800 cm/s AV VTI:  0.570 m AV Peak Grad:      34.5 mmHg AV Mean Grad:      19.0 mmHg LVOT Vmax:         124.00 cm/s LVOT Vmean:        83.833 cm/s LVOT VTI:          0.233 m LVOT/AV VTI ratio: 0.41  AORTA Ao Root diam: 3.30 cm MITRAL VALVE               TRICUSPID VALVE MV Area (PHT): 4.68 cm    TR Peak grad:   8.4 mmHg MV Decel Time: 162 msec    TR Vmax:        145.00 cm/s MV E velocity: 81.00 cm/s MV A velocity: 65.50 cm/s  SHUNTS MV E/A ratio:  1.24        Systemic VTI:  0.23 m                            Systemic Diam: 2.20 cm Skeet Latch MD Electronically signed by Skeet Latch MD Signature Date/Time: 05/10/2022/10:48:39 AM    Final    CT HEAD CODE STROKE WO CONTRAST  Result Date: 05/10/2022 CLINICAL DATA:  Acute neurologic deficit EXAM: CT HEAD WITHOUT CONTRAST CT CERVICAL SPINE WITHOUT CONTRAST TECHNIQUE: Multidetector CT imaging of the head and cervical spine was performed following the standard protocol  without intravenous contrast. Multiplanar CT image reconstructions of the cervical spine were also generated. RADIATION DOSE REDUCTION: This exam was performed according to the departmental dose-optimization program which includes automated exposure control, adjustment of the mA and/or kV according to patient size and/or use of iterative reconstruction technique. COMPARISON:  None Available. FINDINGS: CT HEAD FINDINGS Brain: There is no mass, hemorrhage or extra-axial collection. Generalized atrophy. Multiple old small vessel infarcts of the cerebellum. There is hypoattenuation of the periventricular white matter, most commonly indicating chronic ischemic microangiopathy. Old right frontal and parietal infarcts. Vascular: No abnormal hyperdensity of the major intracranial arteries or dural venous sinuses. No intracranial atherosclerosis. Skull: The visualized skull base, calvarium and extracranial soft tissues are normal. Sinuses/Orbits: No fluid levels or advanced mucosal thickening of the visualized paranasal sinuses. No mastoid or middle ear effusion. The orbits are normal. ASPECTS ([place] Stroke Program Early CT Score) - Ganglionic level infarction (caudate, lentiform nuclei, internal capsule, insula, M1-M3 cortex): 7 - Supraganglionic infarction (M4-M6 cortex): 3 Total score (0-10 with 10 being normal): 10 CT CERVICAL SPINE FINDINGS Alignment: No static subluxation. Facets are aligned. Occipital condyles and the lateral masses of C1 and C2 are normally approximated. Skull base and vertebrae: No acute fracture. Soft tissues and spinal canal: No prevertebral fluid or swelling. No visible canal hematoma. Disc levels: No advanced spinal canal or neural foraminal stenosis. Upper chest: No pneumothorax, pulmonary nodule or pleural effusion. Other: Normal visualized paraspinal cervical soft tissues. IMPRESSION: 1. No acute intracranial abnormality. 2. ASPECTS is 10. 3. No acute fracture or static subluxation of the  cervical spine. These results were communicated to Dr. Kerney Elbe at 12:24 am on 05/10/2022 by text page via the [facility] messaging system. Electronically Signed   By: Ulyses Jarred M.D.   On: 05/10/2022 00:47   CT C-SPINE NO CHARGE  Result Date: 05/10/2022 CLINICAL DATA:  Acute neurologic deficit EXAM: CT HEAD WITHOUT CONTRAST CT CERVICAL SPINE WITHOUT CONTRAST TECHNIQUE: Multidetector CT imaging of the head and cervical spine was performed following the standard protocol without intravenous contrast. Multiplanar CT image  reconstructions of the cervical spine were also generated. RADIATION DOSE REDUCTION: This exam was performed according to the departmental dose-optimization program which includes automated exposure control, adjustment of the mA and/or kV according to patient size and/or use of iterative reconstruction technique. COMPARISON:  None Available. FINDINGS: CT HEAD FINDINGS Brain: There is no mass, hemorrhage or extra-axial collection. Generalized atrophy. Multiple old small vessel infarcts of the cerebellum. There is hypoattenuation of the periventricular white matter, most commonly indicating chronic ischemic microangiopathy. Old right frontal and parietal infarcts. Vascular: No abnormal hyperdensity of the major intracranial arteries or dural venous sinuses. No intracranial atherosclerosis. Skull: The visualized skull base, calvarium and extracranial soft tissues are normal. Sinuses/Orbits: No fluid levels or advanced mucosal thickening of the visualized paranasal sinuses. No mastoid or middle ear effusion. The orbits are normal. ASPECTS ([place] Stroke Program Early CT Score) - Ganglionic level infarction (caudate, lentiform nuclei, internal capsule, insula, M1-M3 cortex): 7 - Supraganglionic infarction (M4-M6 cortex): 3 Total score (0-10 with 10 being normal): 10 CT CERVICAL SPINE FINDINGS Alignment: No static subluxation. Facets are aligned. Occipital condyles and the lateral masses of C1 and C2  are normally approximated. Skull base and vertebrae: No acute fracture. Soft tissues and spinal canal: No prevertebral fluid or swelling. No visible canal hematoma. Disc levels: No advanced spinal canal or neural foraminal stenosis. Upper chest: No pneumothorax, pulmonary nodule or pleural effusion. Other: Normal visualized paraspinal cervical soft tissues. IMPRESSION: 1. No acute intracranial abnormality. 2. ASPECTS is 10. 3. No acute fracture or static subluxation of the cervical spine. These results were communicated to Dr. Kerney Elbe at 12:24 am on 05/10/2022 by text page via the [facility] messaging system. Electronically Signed   By: Ulyses Jarred M.D.   On: 05/10/2022 00:47   CT ANGIO HEAD NECK W WO CM (CODE STROKE)  Result Date: 05/10/2022 CLINICAL DATA:  Acute neurologic deficit EXAM: CT ANGIOGRAPHY HEAD AND NECK TECHNIQUE: Multidetector CT imaging of the head and neck was performed using the standard protocol during bolus administration of intravenous contrast. Multiplanar CT image reconstructions and MIPs were obtained to evaluate the vascular anatomy. Carotid stenosis measurements (when applicable) are obtained utilizing NASCET criteria, using the distal internal carotid diameter as the denominator. RADIATION DOSE REDUCTION: This exam was performed according to the departmental dose-optimization program which includes automated exposure control, adjustment of the mA and/or kV according to patient size and/or use of iterative reconstruction technique. COMPARISON:  None Available. FINDINGS: CTA NECK FINDINGS SKELETON: There is no bony spinal canal stenosis. No lytic or blastic lesion. OTHER NECK: 12 mm right parotid nodule (series 7 image 81). UPPER CHEST: No pneumothorax or pleural effusion. No nodules or masses. AORTIC ARCH: There is calcific atherosclerosis of the aortic arch. There is no aneurysm, dissection or hemodynamically significant stenosis of the visualized portion of the aorta. Normal variant  aortic arch branching pattern with the left vertebral artery arising independently from the aortic arch. The visualized proximal subclavian arteries are widely patent. RIGHT CAROTID SYSTEM: Atherosclerotic web at the bifurcation. Mixed density plaque with less than 50% stenosis. Widely patent ICA. LEFT CAROTID SYSTEM: Calcific atherosclerosis at the bifurcation without hemodynamically significant stenosis. VERTEBRAL ARTERIES: Right dominant configuration. Both origins are clearly patent. There is no dissection, occlusion or flow-limiting stenosis to the skull base (V1-V3 segments). CTA HEAD FINDINGS POSTERIOR CIRCULATION: --Vertebral arteries: Right V4 segment atherosclerosis with widely maintained patency. --Inferior cerebellar arteries: Normal. --Basilar artery: Normal. --Superior cerebellar arteries: Normal. --Posterior cerebral arteries (PCA): Normal. ANTERIOR CIRCULATION: --Intracranial  internal carotid arteries: Atherosclerotic calcification of the internal carotid arteries at the skull base without hemodynamically significant stenosis. --Anterior cerebral arteries (ACA): Normal. Both A1 segments are present. Patent anterior communicating artery (a-comm). --Middle cerebral arteries (MCA): There is occlusion of the proximal M2 segment inferior division of the right MCA (series 10, image 114). Left MCA is normal. VENOUS SINUSES: As permitted by contrast timing, patent. ANATOMIC VARIANTS: Fetal origin of the right posterior cerebral artery. Review of the MIP images confirms the above findings. IMPRESSION: 1. Occlusion of the proximal M2 segment inferior division of the right MCA. 2. 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 3. Nonspecific 12 mm right parotid nodule. 4. Critical Value/emergent results were called by telephone at the time of interpretation on 05/10/2022 at 12:38 am to provider ERIC [facility] , who verbally acknowledged these results. By Electronically Signed    By: Ulyses Jarred M.D.   On: 05/10/2022 00:39     PHYSICAL EXAM  Temp:  [97.7 F (36.5 C)-98.8 F (37.1 C)] 98.5 F (36.9 C) (02/28 1110) Pulse Rate:  [78-82] 82 (02/28 1110) Resp:  [18] 18 (02/28 1110) BP: (136-160)/(55-85) 147/76 (02/28 1110) SpO2:  [92 %-99 %] 96 % (02/28 1110)  General - Well nourished, well developed, in no apparent distress. Cardiovascular - irregularly irregular heart rate and rhythm. (Baseline diagnosis)  Neuro - awake, alert, eyes open, orientated to name, place, time. No aphasia but moderate dysarthria, following all simple commands.  Able to name and repeat with dysarthric voice. Increased responsiveness and attention.  Right gaze preference but does cross midline with tracking. Left visual field cut, no blink to threat, neglect.   Left facial droop. Tongue protrusion midline.  Motor: LUE deltoid, bicep and tricep 3/5, finger grip 2/5.Improved control of movement. Left LE proximal 3/5, distal 4/5, lifts against gravity. RUE 5/5 and RLE 5/5.   Left sensory decreased, but improved from yesterday's exam.   Gait not tested.   ASSESSMENT/PLAN Edward Maynard is a 89 y.o. male with history of A-fib on Xarelto, hyperlipidemia, stroke in 05/2013 admitted for left-sided weakness and he fell at home. No tPA given due to outside window.    Stroke:  right large MCA infarct due to right M2 occlusion status post IR with  [id number], embolic secondary to A-fib given on Xarelto  CT no acute abnormality Repeat CT 2/26 No new abnormality. Large right posterior division MCA territory infarct with mild petechial hemorrhage. Chronic cerebellar and thalamic small vessel infarcts. 15 mm right parotid mass CTA head and neck right M2 proximal occlusion, right ICA bulb atherosclerosis IR with right M2 distal occlusion. S/p 4 passes, TICI2c  MRI large right MCA infarct MRA persistent right MCA occlusion although motion degraded 2D Echo EF 60 to 65% LDL 56 HgbA1c 5.8 VTE  prophylaxis: eliquis Xarelto (rivaroxaban) daily prior to admission, now on eliquis   Ongoing aggressive stroke risk factor management Therapy recommendations: CIR Disposition: awaiting insurance approval for CIR   History of stroke 05/2013 admitted for left-sided weakness and imbalance.  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  MRA/TTE/carotid Doppler negative.  Discharged on aspirin and Zocor. Outpatient 30-day CardioNet monitoring negative and loop recorder placed.  Found to have A-fib in 12/2014, started on Xarelto.  Mild left sided residual at baseline.  Chronic A-fib On Xarelto at home Currently [facility] on hold given large size of infarct Now on Eliquis   Hypertension Home meds: toprol 12.5 Stable on  the high end BP goal < 180/105 Off cleviprex Put on amlodipine '10mg'$  and increase home toprol from 12.5 to 25 Long term BP goal normotensive  Hyperlipidemia Home meds: Zocor 40 LDL 56, goal < 70 Now on lipitor 20 (zocor 40 equivalent, as amlodipine can increase the risk of rhabdo when given with Zocor )  No high intensity statin given LDL at goal Continue statin at discharge  Dysphagia  Pass swallow On dys 1  puree and nectar thick liquid IVF added back d/t increased creatinine Encourage po intake  Other Stroke Risk Factors Advanced age Obesity, Body mass index is 30.68 kg/m.   Other Active Problems CKD 3A, creatinine 1.30-1.43-1.13-1.03 - 1.26 - 1.04 Will reduce IVF, encourage PO Hypokalemia: 3.7, replace and recheck in AM  Mild leukocytosis, resolved: WBC 11.6-8.7-7.4 - 7.0 - 7.1 Colace and PRN Dulcolax added to avoid constipation Insomnia - Ambien stopped. Trazodone PRN started, received 2/25 pm only.   Hospital day # 7  Pt seen by Neuro NP/APP and later by MD. Note/plan to be edited by MD as needed.    Beulah Gandy DNP, ACNPC-AG  [facility] I have personally obtained history,examined this patient, reviewed notes,  independently viewed imaging studies, participated in medical decision making and plan of care.ROS completed by me personally and pertinent positives fully documented  I have made any additions or clarifications directly to the above note. Agree with note above.  Patient is showing slight improvement in his left hemiparesis.  Continue ongoing therapy and transfer to inpatient rehab after insurance approval when bed available hopefully in the next few days.  Long discussion patient and daughter at the bedside and answered questions.  Discussed with rehab coordinator.  Greater than 50% time during this 35-minute visit was spent in counseling and coordination of care about stroke and hemiplegia and discussion about rehab plans and answering questions.  Antony Contras, MD Medical Director [facility] Pager: [phone] 05/17/2022 3:21 PM   To contact Stroke Continuity provider, please refer to [email or web address] After hours, contact General Neurology

## 2022-05-17 NOTE — Progress Notes (Signed)
Inpatient Rehab Admissions Coordinator:   Per [facility] family wanting to discuss 24/7 care following rehab stay.  I met with pt and his daughter, [name], at bedside to discuss goals and expectations.  We discussed 24/7 care, with physical assist for ADLs, and possibly physical assist for transfers w/c level (worst case) or physical assist for short distance ambulation (best case).  [name] states that she and her spouse have discussed him working from home to provide care at the patient's home at discharge.  [name] can come assist after working hours, and they will hire caregivers for nights/weekends.  Notified [facility] and they were able to provide list of home care agencies for family to review.  I will begin insurance auth today.   Shann Medal, PT, DPT Admissions Coordinator [phone] 05/17/22  12:39 PM

## 2022-05-17 NOTE — Progress Notes (Signed)
Speech Language Pathology Treatment: Dysphagia;Cognitive-Linquistic  Patient Details Name: Edward Maynard MRN: [id number] DOB: [birth year] Today's Date: 05/17/2022 Time: 1030-1100 SLP Time Calculation (min) (ACUTE ONLY): 30 min  Assessment / Plan / Recommendation Clinical Impression  Pt demonstrates significant improvement in oral phase control subjectively since MBS. Pt now able to follow cue to take single sips from a straw, independently placed straw effectively and followed with 100% accuracy with no reminders needed. Pt carried this over from ensure to thin water with a throat clear only. No anterior spillage with liquids. Pt now with increased awareness for oral manipulation of soft solids. SLP offered softened graham cracker and then some pear which pt prepared and cleared without oral residue and mild anterior spill on left with cracker.  Pt is having trouble with ill fitting dentures but demonstrates enough ability to attempt finely ground solids. Provided teaching to [name] about benefit of intermittent throat clearing for pulmonary hygiene, benefit of transition to thin liquids and further precautions like position and avoiding chunky foods. Pt may or may not be capable of orally manipulating dys 2 meals, but we will attempt and reassess tomorrow with a tray.   HPI HPI: 89 year old male with atrial fibrillation (on Xarelto), prior stroke, presenting with acute onset of left hemiplegia and left hemineglect after sustaining a fall to the left while at home this evening. Underwent thrombectomy      SLP Plan  Continue with current plan of care      Recommendations for follow up therapy are one component of a multi-disciplinary discharge planning process, led by the attending physician.  Recommendations may be updated based on patient status, additional functional criteria and insurance authorization.    Recommendations  Diet recommendations: Dysphagia 2 (fine chop);Thin liquid Liquids  provided via: Straw Medication Administration: Whole meds with puree Supervision: Staff to assist with self feeding Compensations: Slow rate;Small sips/bites Postural Changes and/or Swallow Maneuvers: Seated upright 90 degrees                Oral Care Recommendations: Oral care BID;Oral care prior to ice chip/H20 Follow Up Recommendations: Skilled nursing-short term rehab (<3 hours/day) Assistance recommended at discharge: Frequent or constant Supervision/Assistance SLP Visit Diagnosis: Dysphagia, oropharyngeal phase (R13.12) Frontal lobe and executive function deficit following: Cerebral infarction Plan: Continue with current plan of care           Makalah Asberry, Katherene Ponto  05/17/2022, 11:35 AM

## 2022-05-18 ENCOUNTER — Inpatient Hospital Stay (HOSPITAL_COMMUNITY)
Admission: RE | Admit: 2022-05-18 | Discharge: 2022-06-08 | DRG: 057 | Disposition: A | Discharge status: Skilled Nursing Facility | Payer: Medicare Other | Source: Intra-hospital | Attending: Physical Medicine & Rehabilitation | Admitting: Physical Medicine & Rehabilitation

## 2022-05-18 ENCOUNTER — Other Ambulatory Visit: Payer: Self-pay

## 2022-05-18 ENCOUNTER — Encounter (HOSPITAL_COMMUNITY): Payer: Self-pay | Admitting: Physical Medicine & Rehabilitation

## 2022-05-18 ENCOUNTER — Other Ambulatory Visit (HOSPITAL_COMMUNITY): Payer: Self-pay

## 2022-05-18 DIAGNOSIS — I69322 Dysarthria following cerebral infarction: Secondary | ICD-10-CM

## 2022-05-18 DIAGNOSIS — I6601 Occlusion and stenosis of right middle cerebral artery: Secondary | ICD-10-CM

## 2022-05-18 DIAGNOSIS — I69398 Other sequelae of cerebral infarction: Secondary | ICD-10-CM | POA: Diagnosis not present

## 2022-05-18 DIAGNOSIS — Z86010 Personal history of colonic polyps: Secondary | ICD-10-CM

## 2022-05-18 DIAGNOSIS — Z743 Need for continuous supervision: Secondary | ICD-10-CM | POA: Diagnosis not present

## 2022-05-18 DIAGNOSIS — I482 Chronic atrial fibrillation, unspecified: Secondary | ICD-10-CM | POA: Diagnosis not present

## 2022-05-18 DIAGNOSIS — Z7401 Bed confinement status: Secondary | ICD-10-CM | POA: Diagnosis not present

## 2022-05-18 DIAGNOSIS — Z825 Family history of asthma and other chronic lower respiratory diseases: Secondary | ICD-10-CM | POA: Diagnosis not present

## 2022-05-18 DIAGNOSIS — N189 Chronic kidney disease, unspecified: Secondary | ICD-10-CM | POA: Diagnosis not present

## 2022-05-18 DIAGNOSIS — R1312 Dysphagia, oropharyngeal phase: Secondary | ICD-10-CM | POA: Diagnosis not present

## 2022-05-18 DIAGNOSIS — R1310 Dysphagia, unspecified: Secondary | ICD-10-CM | POA: Diagnosis not present

## 2022-05-18 DIAGNOSIS — H5509 Other forms of nystagmus: Secondary | ICD-10-CM | POA: Diagnosis not present

## 2022-05-18 DIAGNOSIS — M25511 Pain in right shoulder: Secondary | ICD-10-CM | POA: Diagnosis not present

## 2022-05-18 DIAGNOSIS — E7800 Pure hypercholesterolemia, unspecified: Secondary | ICD-10-CM | POA: Diagnosis present

## 2022-05-18 DIAGNOSIS — I69391 Dysphagia following cerebral infarction: Secondary | ICD-10-CM

## 2022-05-18 DIAGNOSIS — R29898 Other symptoms and signs involving the musculoskeletal system: Secondary | ICD-10-CM | POA: Diagnosis not present

## 2022-05-18 DIAGNOSIS — I480 Paroxysmal atrial fibrillation: Secondary | ICD-10-CM | POA: Diagnosis not present

## 2022-05-18 DIAGNOSIS — I63511 Cerebral infarction due to unspecified occlusion or stenosis of right middle cerebral artery: Principal | ICD-10-CM | POA: Diagnosis present

## 2022-05-18 DIAGNOSIS — I4729 Other ventricular tachycardia: Secondary | ICD-10-CM | POA: Diagnosis not present

## 2022-05-18 DIAGNOSIS — Z87891 Personal history of nicotine dependence: Secondary | ICD-10-CM | POA: Diagnosis not present

## 2022-05-18 DIAGNOSIS — K118 Other diseases of salivary glands: Secondary | ICD-10-CM | POA: Diagnosis not present

## 2022-05-18 DIAGNOSIS — M19011 Primary osteoarthritis, right shoulder: Secondary | ICD-10-CM | POA: Diagnosis present

## 2022-05-18 DIAGNOSIS — R3915 Urgency of urination: Secondary | ICD-10-CM | POA: Diagnosis present

## 2022-05-18 DIAGNOSIS — F32A Depression, unspecified: Secondary | ICD-10-CM | POA: Diagnosis not present

## 2022-05-18 DIAGNOSIS — M75101 Unspecified rotator cuff tear or rupture of right shoulder, not specified as traumatic: Secondary | ICD-10-CM | POA: Diagnosis present

## 2022-05-18 DIAGNOSIS — R451 Restlessness and agitation: Secondary | ICD-10-CM | POA: Diagnosis present

## 2022-05-18 DIAGNOSIS — G4700 Insomnia, unspecified: Secondary | ICD-10-CM | POA: Diagnosis not present

## 2022-05-18 DIAGNOSIS — I10 Essential (primary) hypertension: Secondary | ICD-10-CM

## 2022-05-18 DIAGNOSIS — I129 Hypertensive chronic kidney disease with stage 1 through stage 4 chronic kidney disease, or unspecified chronic kidney disease: Secondary | ICD-10-CM | POA: Diagnosis present

## 2022-05-18 DIAGNOSIS — R2689 Other abnormalities of gait and mobility: Secondary | ICD-10-CM | POA: Diagnosis not present

## 2022-05-18 DIAGNOSIS — N1831 Chronic kidney disease, stage 3a: Secondary | ICD-10-CM | POA: Diagnosis not present

## 2022-05-18 DIAGNOSIS — E785 Hyperlipidemia, unspecified: Secondary | ICD-10-CM

## 2022-05-18 DIAGNOSIS — R4586 Emotional lability: Secondary | ICD-10-CM | POA: Diagnosis present

## 2022-05-18 DIAGNOSIS — F419 Anxiety disorder, unspecified: Secondary | ICD-10-CM | POA: Diagnosis present

## 2022-05-18 DIAGNOSIS — Z79899 Other long term (current) drug therapy: Secondary | ICD-10-CM

## 2022-05-18 DIAGNOSIS — G479 Sleep disorder, unspecified: Secondary | ICD-10-CM | POA: Diagnosis not present

## 2022-05-18 DIAGNOSIS — H5340 Unspecified visual field defects: Secondary | ICD-10-CM | POA: Diagnosis present

## 2022-05-18 DIAGNOSIS — I493 Ventricular premature depolarization: Secondary | ICD-10-CM | POA: Diagnosis not present

## 2022-05-18 DIAGNOSIS — I4891 Unspecified atrial fibrillation: Secondary | ICD-10-CM | POA: Diagnosis not present

## 2022-05-18 DIAGNOSIS — Z823 Family history of stroke: Secondary | ICD-10-CM

## 2022-05-18 DIAGNOSIS — Z8249 Family history of ischemic heart disease and other diseases of the circulatory system: Secondary | ICD-10-CM | POA: Diagnosis not present

## 2022-05-18 DIAGNOSIS — I69354 Hemiplegia and hemiparesis following cerebral infarction affecting left non-dominant side: Secondary | ICD-10-CM | POA: Diagnosis not present

## 2022-05-18 DIAGNOSIS — M6281 Muscle weakness (generalized): Secondary | ICD-10-CM | POA: Diagnosis not present

## 2022-05-18 DIAGNOSIS — G459 Transient cerebral ischemic attack, unspecified: Secondary | ICD-10-CM | POA: Diagnosis not present

## 2022-05-18 DIAGNOSIS — I639 Cerebral infarction, unspecified: Secondary | ICD-10-CM | POA: Diagnosis present

## 2022-05-18 DIAGNOSIS — R54 Age-related physical debility: Secondary | ICD-10-CM | POA: Diagnosis not present

## 2022-05-18 LAB — BASIC METABOLIC PANEL
Anion gap: 11 (ref 5–15)
BUN: 18 mg/dL (ref 8–23)
CO2: 22 mmol/L (ref 22–32)
Calcium: 9.1 mg/dL (ref 8.9–10.3)
Chloride: 104 mmol/L (ref 98–111)
Creatinine, Ser: 0.98 mg/dL (ref 0.61–1.24)
GFR, Estimated: >60 mL/min (ref >60)
Glucose, Bld: 127 mg/dL — ABNORMAL HIGH (ref 70–99)
Potassium: 3.8 mmol/L (ref 3.5–5.1)
Sodium: 137 mmol/L (ref 135–145)

## 2022-05-18 LAB — CBC
HCT: 44 % (ref 39.0–52.0)
Hemoglobin: 15.7 g/dL (ref 13.0–17.0)
MCH: 33.7 pg (ref 26.0–34.0)
MCHC: 35.7 g/dL (ref 30.0–36.0)
MCV: 94.4 fL (ref 80.0–100.0)
Platelets: 198 10*3/uL (ref 150–400)
RBC: 4.66 MIL/uL (ref 4.22–5.81)
RDW: 13.2 % (ref 11.5–15.5)
WBC: 8.1 10*3/uL (ref 4.0–10.5)
nRBC: 0 % (ref 0.0–0.2)

## 2022-05-18 MED ORDER — DOCUSATE SODIUM 100 MG PO CAPS
100.0000 mg | ORAL_CAPSULE | Freq: Every day | ORAL | Status: DC
  Administered 2022-05-19 – 2022-06-08 (×21): 100 mg via ORAL
  Filled 2022-05-18 (×21): qty 1

## 2022-05-18 MED ORDER — AMLODIPINE BESYLATE 10 MG PO TABS
10.0000 mg | ORAL_TABLET | Freq: Every day | ORAL | Status: DC
  Administered 2022-05-19 – 2022-06-08 (×21): 10 mg via ORAL
  Filled 2022-05-18 (×21): qty 1

## 2022-05-18 MED ORDER — METOPROLOL SUCCINATE ER 25 MG PO TB24
25.0000 mg | ORAL_TABLET | Freq: Every day | ORAL | 0 refills | Status: DC
  Filled 2022-05-18: qty 30, 30d supply, fill #0

## 2022-05-18 MED ORDER — ACETAMINOPHEN 650 MG RE SUPP
650.0000 mg | RECTAL | Status: DC | PRN
  Administered 2022-05-28: 650 mg via RECTAL

## 2022-05-18 MED ORDER — ENSURE ENLIVE PO LIQD
237.0000 mL | Freq: Three times a day (TID) | ORAL | Status: DC
  Administered 2022-05-18 – 2022-06-08 (×53): 237 mL via ORAL

## 2022-05-18 MED ORDER — ATORVASTATIN CALCIUM 20 MG PO TABS
20.0000 mg | ORAL_TABLET | Freq: Every day | ORAL | 0 refills | Status: DC
  Filled 2022-05-18: qty 30, 30d supply, fill #0

## 2022-05-18 MED ORDER — TRAZODONE HCL 50 MG PO TABS
50.0000 mg | ORAL_TABLET | Freq: Every evening | ORAL | 0 refills | Status: DC | PRN
  Filled 2022-05-18: qty 30, 30d supply, fill #0

## 2022-05-18 MED ORDER — SENNOSIDES-DOCUSATE SODIUM 8.6-50 MG PO TABS
1.0000 | ORAL_TABLET | Freq: Every evening | ORAL | Status: DC | PRN
  Administered 2022-05-23 – 2022-05-25 (×3): 1 via ORAL
  Filled 2022-05-18 (×3): qty 1

## 2022-05-18 MED ORDER — ACETAMINOPHEN 325 MG PO TABS
650.0000 mg | ORAL_TABLET | ORAL | Status: DC | PRN
  Administered 2022-05-28 – 2022-06-03 (×10): 650 mg via ORAL
  Filled 2022-05-18 (×11): qty 2

## 2022-05-18 MED ORDER — BISACODYL 10 MG RE SUPP
10.0000 mg | Freq: Every day | RECTAL | Status: DC | PRN
  Administered 2022-05-25 – 2022-05-30 (×2): 10 mg via RECTAL
  Filled 2022-05-18 (×2): qty 1

## 2022-05-18 MED ORDER — DOCUSATE SODIUM 100 MG PO CAPS
100.0000 mg | ORAL_CAPSULE | Freq: Every day | ORAL | 0 refills | Status: DC
  Filled 2022-05-18: qty 10, 10d supply, fill #0

## 2022-05-18 MED ORDER — APIXABAN 5 MG PO TABS
5.0000 mg | ORAL_TABLET | Freq: Two times a day (BID) | ORAL | 0 refills | Status: AC
  Filled 2022-05-18: qty 60, 30d supply, fill #0

## 2022-05-18 MED ORDER — APIXABAN 5 MG PO TABS
5.0000 mg | ORAL_TABLET | Freq: Two times a day (BID) | ORAL | Status: DC
  Administered 2022-05-18 – 2022-06-08 (×42): 5 mg via ORAL
  Filled 2022-05-18 (×43): qty 1

## 2022-05-18 MED ORDER — METOPROLOL SUCCINATE ER 25 MG PO TB24
25.0000 mg | ORAL_TABLET | Freq: Every day | ORAL | Status: DC
  Administered 2022-05-19 – 2022-06-08 (×21): 25 mg via ORAL
  Filled 2022-05-18 (×21): qty 1

## 2022-05-18 MED ORDER — TRAZODONE HCL 50 MG PO TABS
50.0000 mg | ORAL_TABLET | Freq: Every evening | ORAL | Status: DC | PRN
  Administered 2022-05-18 – 2022-05-21 (×4): 50 mg via ORAL
  Filled 2022-05-18 (×4): qty 1

## 2022-05-18 MED ORDER — ACETAMINOPHEN 160 MG/5ML PO SOLN: 650.0000 mg | ORAL | Status: DC | PRN

## 2022-05-18 MED ORDER — ENSURE ENLIVE PO LIQD
237.0000 mL | Freq: Three times a day (TID) | ORAL | 12 refills | Status: DC
  Filled 2022-05-18: qty 237, 1d supply, fill #0

## 2022-05-18 MED ORDER — AMLODIPINE BESYLATE 10 MG PO TABS
10.0000 mg | ORAL_TABLET | Freq: Every day | ORAL | 0 refills | Status: DC
  Filled 2022-05-18: qty 30, 30d supply, fill #0

## 2022-05-18 MED ORDER — ATORVASTATIN CALCIUM 10 MG PO TABS
20.0000 mg | ORAL_TABLET | Freq: Every day | ORAL | Status: DC
  Administered 2022-05-19 – 2022-06-08 (×21): 20 mg via ORAL
  Filled 2022-05-18 (×21): qty 2

## 2022-05-18 NOTE — H&P (Signed)
Physical Medicine and Rehabilitation Admission H&P    Chief Complaint  Patient presents with   Code Stroke  : HPI: Edward Maynard is an 89 year old right-handed male with history of atrial fibrillation maintained on Xarelto, hyperlipidemia, 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  Per chart review patient lives with spouse.  [name] in the room to assist with history.  Two-level home bed and bath main level.  Independent prior to admission.  Wife with dementia and limited assistance.  Family with good support and plans to provide 24/7 care.  Presented 05/10/2022 with acute onset of left-sided weakness resulting in a fall.  Admission chemistries unremarkable except glucose 158, creatinine 1.33, hemoglobin A1c 5.8.  Cranial CT scan negative, CT cervical spine negative.  CT angiogram of head and neck showed occlusion of the proximal M2 segment inferior division of the right MCA.  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  Nonspecific 15 mm right parotid nodule.  Patient did not receive tPA.  IR consulted underwent revascularization of right M2 distal occlusion.  Echocardiogram with ejection fraction of 60 to 65% no wall motion abnormalities grade 2 diastolic dysfunction.  Neurology follow-up patient has been transition to Eliquis for CVA prophylaxis/atrial fibrillation.  He is on a dysphagia #2 thin liquid diet.  Patient denies any pain.  Family reports his dysarthria has improved.  Therapy evaluations completed due to patient decreased functional mobility left-sided weakness was admitted for a comprehensive rehab program.  Review of Systems  Constitutional:  Negative for chills and fever.  HENT:  Negative for hearing loss.   Eyes:  Negative for blurred vision and double vision.  Respiratory:  Negative for  cough, shortness of breath and wheezing.   Cardiovascular:  Positive for palpitations. Negative for chest pain and leg swelling.  Gastrointestinal:  Positive for constipation. Negative for abdominal pain, heartburn, nausea and vomiting.  Genitourinary:  Positive for urgency. Negative for dysuria, flank pain and hematuria.  Musculoskeletal:  Positive for joint pain and myalgias.  Skin:  Negative for rash.  Neurological:  Positive for sensory change and weakness.  All other systems reviewed and are negative.  Past Medical History:  Diagnosis Date   High cholesterol    05/2013; no defecits   Hyperlipemia    Left arm weakness    Left hand weakness    Personal history of colonic polyp - adenoma  10/23/2013   Stroke ([place] [facility]) 05/2013   MRI on 07/02/13 = Multifocal acute & subacute infarction involving right frontal MCA/ACA & left parietal MCA/PCA watershed areas   Past Surgical History:  Procedure Laterality Date   IR CT HEAD LTD  05/10/2022   IR PERCUTANEOUS ART THROMBECTOMY/INFUSION INTRACRANIAL INC DIAG ANGIO  05/10/2022   LOOP RECORDER IMPLANT N/A 10/31/2013   Procedure: LOOP RECORDER IMPLANT;  Surgeon: Coralyn Mark, MD;  Location: [facility];  Service: Cardiovascular;  Laterality: N/A;   RADIOLOGY WITH ANESTHESIA N/A 05/10/2022   Procedure: IR WITH ANESTHESIA;  Surgeon: Luanne Bras, MD;  Location: [facility];  Service: Radiology;  Laterality: N/A;   SHOULDER SURGERY Bilateral 2003, 1995   Family History  Problem Relation Age of Onset   Hypertension Mother    Hypertension Father    Stroke Father    COPD Brother    Stroke Brother    Colon cancer Neg Hx  Heart attack Neg Hx    Social History:  reports that he quit smoking about 52 years ago. His smoking use included cigarettes. He has never used smokeless tobacco. He reports that he does not drink alcohol and does not use drugs. Allergies: No Known Allergies Medications Prior to Admission  Medication Sig Dispense Refill   metoprolol  succinate (TOPROL XL) 25 MG 24 hr tablet Take 0.5 tablets (12.5 mg total) by mouth daily. 45 tablet 3   rivaroxaban (XARELTO) 20 MG TABS tablet TAKE 1 TABLET BY MOUTH DAILY  WITH SUPPER (Patient taking differently: Take 20 mg by mouth daily with supper.) 100 tablet 3   simvastatin (ZOCOR) 40 MG tablet Take 1 tablet by mouth  daily at 6 PM. 90 tablet 3      Home: Home Living Family/patient expects to be discharged to:: Private residence Living Arrangements: Spouse/significant other (Wife has dementia) Available Help at Discharge: Family Type of Home: House Home Access: Stairs to enter Technical brewer of Steps: 2 Entrance Stairs-Rails: None Home Layout: Two level, Able to live on main level with bedroom/bathroom Bathroom Shower/Tub: Chiropodist: Handicapped height Home Equipment: Conservation officer, nature (2 wheels), Rollator (4 wheels), Shower seat Additional Comments: Pt reports that he did not use any DME at baseline  Lives With: Spouse   Functional History: Prior Function Prior Level of Function : Independent/Modified Independent, Driving Mobility Comments: No AD ADLs Comments: Played golf  Functional Status:  Mobility: Bed Mobility Overal bed mobility: Needs Assistance Bed Mobility: Rolling, Sidelying to Sit, Sit to Supine Rolling: Mod assist Sidelying to sit: Mod assist, HOB elevated Supine to sit: Mod assist, HOB elevated, +2 for safety/equipment Sit to supine: Mod assist, HOB elevated General bed mobility comments: PT helping position L UE to ensure safety with rolling to L and pushing up on L arm to sit up L EOB. ModA to lift legs onto bed to return to supine and direct trunk to midline. Transfers Overall transfer level: Needs assistance Equipment used: Ambulation equipment used Transfers: Sit to/from Stand Sit to Stand: Min assist, Mod assist Bed to/from chair/wheelchair/BSC transfer type:: Lateral/scoot transfer Stand pivot transfers: +2 physical  assistance, +2 safety/equipment, Total assist  Lateral/Scoot Transfers: Max assist, +2 physical assistance Transfer via Lift Equipment: Stedy General transfer comment: Pt needing physical assistance for trunk stability and lifting to place L foot on stedy platform for set-up. Needs cues and guidance for safe L hand placement on stedy bar. ModA to power up to stand from EOB to stedy 1x, minA from stedy flaps >5x. Ambulation/Gait General Gait Details: unable    ADL: ADL Overall ADL's : Needs assistance/impaired Eating/Feeding: Supervision/ safety Eating/Feeding Details (indicate cue type and reason): cues to locate items on left Grooming: Wash/dry face, Brushing hair, Minimal assistance Grooming Details (indicate cue type and reason): performd from [facility] at sink with cues to attend to left Upper Body Bathing: Bed level, Maximal assistance Lower Body Bathing: Total assistance, Bed level Upper Body Dressing : Maximal assistance, Bed level Lower Body Dressing: Total assistance, Bed level Toilet Transfer: Maximal assistance, +2 for physical assistance, Stand-pivot Toilet Transfer Details (indicate cue type and reason): simulated to recliner Toileting- Clothing Manipulation and Hygiene: Total assistance, Bed level  Cognition: Cognition Overall Cognitive Status: Impaired/Different from baseline Arousal/Alertness: Awake/alert Orientation Level: Oriented X4 Attention: Sustained, Selective Sustained Attention: Appears intact Selective Attention: Impaired Selective Attention Impairment: Functional basic Memory: Impaired Memory Impairment: Decreased short term memory, Storage deficit Decreased Short Term Memory: Verbal basic Awareness: Impaired  Awareness Impairment: Intellectual impairment, Emergent impairment, Anticipatory impairment Problem Solving: Impaired Problem Solving Impairment: Functional basic Safety/Judgment: Impaired Cognition Arousal/Alertness: Awake/alert Behavior During  Therapy: WFL for tasks assessed/performed Overall Cognitive Status: Impaired/Different from baseline Area of Impairment: Attention, Awareness, Problem solving, Memory, Following commands, Safety/judgement Current Attention Level: Sustained Memory: Decreased short-term memory Following Commands: Follows one step commands with increased time, Follows one step commands consistently Safety/Judgement: Decreased awareness of safety, Decreased awareness of deficits Awareness: Emergent Problem Solving: Slow processing, Difficulty sequencing, Requires verbal cues General Comments: Pt needing cues to turn head and eyes to L to attend to objects or his own extremities on his L, often neglecting that side with poor awareness of L extremity protection. Able to detect which way he is leaning off midline, especially when provided visual input from a mirror, and correct it with min cues.  Physical Exam: Blood pressure (!) 137/98, pulse 80, temperature 98.4 F (36.9 C), temperature source Axillary, resp. rate 18, height 5' 11.5" (1.816 m), weight 101.2 kg, SpO2 96 %.   General:  No apparent distress HEENT: Head is normocephalic, atraumatic, PERRLA, sclera anicteric, oral mucosa pink and moist,  Neck: Supple without JVD or lymphadenopathy Heart: Irregularly irregular rhythm,, normal rate, No murmurs rubs or gallops Chest: CTA bilaterally without wheezes, rales, or rhonchi; no distress Abdomen: Soft, non-tender, non-distended, bowel sounds positive. Extremities: No clubbing, cyanosis, or edema. Pulses are 2+ Psych: Pt's affect is appropriate. Pt is cooperative Skin: Clean and intact without signs of breakdown Neuro:  Alert and oriented x4, Able to name 3 items, able to repeat, follows simple commands, R gaze preference, mild dysarthria, L visual field cut, Left facial weakness, Tongue midline, Follows commands Decreased sensation LUE and LLE Strength 5 out of 5 in right upper and lower extremity Left upper  extremity shoulder abduction and 4-5, elbow flexion extension 4 out of 5, finger flexion 2 out of 5 Left lower extremity proximal 4- out of 5 distal 4 out of 5 Finger-nose intact on the right  Musculoskeletal: No joint swelling or tenderness No abnormal tone noted    Results for orders placed or performed during the hospital encounter of 05/10/22 (from the past 48 hour(s))  Basic metabolic panel     Status: Abnormal   Collection Time: 05/16/22  6:25 AM  Result Value Ref Range   Sodium 138 135 - 145 mmol/L   Potassium 3.7 3.5 - 5.1 mmol/L   Chloride 107 98 - 111 mmol/L   CO2 21 (L) 22 - 32 mmol/L   Glucose, Bld 118 (H) 70 - 99 mg/dL    Comment: Glucose reference range applies only to samples taken after fasting for at least 8 hours.   BUN 27 (H) 8 - 23 mg/dL   Creatinine, Ser 1.04 0.61 - 1.24 mg/dL   Calcium 8.7 (L) 8.9 - 10.3 mg/dL   GFR, Estimated >60 >60 mL/min    Comment: (NOTE) Calculated using the CKD-EPI Creatinine Equation (2021)    Anion gap 10 5 - 15    Comment: Performed at [facility] [address] [place] [address]  CBC     Status: None   Collection Time: 05/16/22  6:25 AM  Result Value Ref Range   WBC 7.1 4.0 - 10.5 K/uL   RBC 4.60 4.22 - 5.81 MIL/uL   Hemoglobin 15.3 13.0 - 17.0 g/dL   HCT 44.2 39.0 - 52.0 %   MCV 96.1 80.0 - 100.0 fL   MCH 33.3 26.0 -  34.0 pg   MCHC 34.6 30.0 - 36.0 g/dL   RDW 13.5 11.5 - 15.5 %   Platelets 171 150 - 400 K/uL   nRBC 0.0 0.0 - 0.2 %    Comment: Performed at [facility], [place] [address], [place], [place] [id number]  Basic metabolic panel     Status: Abnormal   Collection Time: 05/18/22  4:54 AM  Result Value Ref Range   Sodium 137 135 - 145 mmol/L   Potassium 3.8 3.5 - 5.1 mmol/L   Chloride 104 98 - 111 mmol/L   CO2 22 22 - 32 mmol/L   Glucose, Bld 127 (H) 70 - 99 mg/dL    Comment: Glucose reference range applies only to samples taken after fasting for at least 8 hours.   BUN 18 8 - 23 mg/dL    Creatinine, Ser 0.98 0.61 - 1.24 mg/dL   Calcium 9.1 8.9 - 10.3 mg/dL   GFR, Estimated >60 >60 mL/min    Comment: (NOTE) Calculated using the CKD-EPI Creatinine Equation (2021)    Anion gap 11 5 - 15    Comment: Performed at [facility] [address] [place] [address]  CBC     Status: None   Collection Time: 05/18/22  4:54 AM  Result Value Ref Range   WBC 8.1 4.0 - 10.5 K/uL   RBC 4.66 4.22 - 5.81 MIL/uL   Hemoglobin 15.7 13.0 - 17.0 g/dL   HCT 44.0 39.0 - 52.0 %   MCV 94.4 80.0 - 100.0 fL   MCH 33.7 26.0 - 34.0 pg   MCHC 35.7 30.0 - 36.0 g/dL   RDW 13.2 11.5 - 15.5 %   Platelets 198 150 - 400 K/uL   nRBC 0.0 0.0 - 0.2 %    Comment: Performed at [facility], [place] [address] [place], [place] [address]   CT HEAD WO CONTRAST (5MM)  Result Date: 05/16/2022 CLINICAL DATA:  Stroke follow-up EXAM: CT HEAD WITHOUT CONTRAST TECHNIQUE: Contiguous axial images were obtained from the base of the skull through the vertex without intravenous contrast. RADIATION DOSE REDUCTION: This exam was performed according to the departmental dose-optimization program which includes automated exposure control, adjustment of the mA and/or kV according to patient size and/or use of iterative reconstruction technique. COMPARISON:  05/10/2022 FINDINGS: Brain: Known acute infarct in the posterior division of the right MCA territory with petechial hemorrhage. No hematoma or malignant swelling. Chronic bilateral cerebellar and right thalamic infarcts. No hemorrhage, hydrocephalus, or masslike finding. Vascular: No new abnormality Skull: Negative Sinuses/Orbits: Negative Other: Partially covered right parotid mass measuring 15 mm. IMPRESSION: 1. No new abnormality. Large right posterior division MCA territory infarct with mild petechial hemorrhage. 2. Chronic cerebellar and thalamic small vessel infarcts. 3. 15 mm right parotid mass. Electronically Signed   By: Jorje Guild M.D.   On: 05/16/2022  07:03      Blood pressure (!) 137/98, pulse 80, temperature 98.4 F (36.9 C), temperature source Axillary, resp. rate 18, height 5' 11.5" (1.816 m), weight 101.2 kg, SpO2 96 %.  Medical Problem List and Plan: 1. Functional deficits secondary to right large MCA infarction due to right M2 occlusion status post IR/revascularization as well as history of CVA 2015 with left-sided residual weakness status post loop recorder  -patient may  shower  -ELOS/Goals: 14-18 days, PT min A, OT min to mod A, SLP supervision  -Admit to CIR  -Continue work on visual scanning as he appears  to have a visual field cut on the left 2.  Antithrombotics: -DVT/anticoagulation:  Pharmaceutical: Eliquis  -antiplatelet therapy: N/A 3. Pain Management: Tylenol as needed 4. Mood/Behavior/Sleep: Provide emotional support  -antipsychotic agents: N/A 5. Neuropsych/cognition: This patient is capable of making decisions on his own behalf. 6. Skin/Wound Care: Routine skin checks 7. Fluids/Electrolytes/Nutrition: Routine in and outs with follow-up chemistries 8.  Dysphagia.  Dysphagia #2 thin liquids.  Follow-up speech therapy 9.  Hypertension.  Norvasc 10 mg daily, Toprol-XL 25 mg daily.  Monitor with increased mobility.  Overall well-controlled.  Goal BP less than 180/105, long-term goal normotensive 10.  Atrial fibrillation.  Cardiac rate controlled.  Continue Toprol-xl 25 mg daily 11.  Hyperlipidemia.  Lipitor 20 mg daily 12.  Parotid mass, follow-up outpatient 13.  CKD 3a.  Recheck labs tomorrow morning   Cathlyn Parsons, PA-C 05/18/2022  I have personally performed a face to face diagnostic evaluation of this patient and formulated the key components of the plan.  Additionally, I have personally reviewed laboratory data, imaging studies, as well as relevant notes and concur with the physician assistant's documentation above.  The patient's status has not changed from the original H&P.  Any changes in  documentation from the acute care chart have been noted above.  Jennye Boroughs, MD, [facility]

## 2022-05-18 NOTE — Progress Notes (Signed)
Occupational Therapy Treatment Patient Details Name: Edward Maynard MRN: [id number] DOB: [birth year] Today's Date: 05/18/2022   History of present illness Pt is an 89 y.o. male who presented 05/10/22 with L-sided weakness and a fall. Outside window for tPA. 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 PMH: A-fib on Xarelto, HLD, CVA in 05/2013   OT comments  Pt finishing on bedpan upon therapy arrival and therapist's assisted with hygiene. Daughter present in room through most of session observing. Session focused on increasing midline alignment and left side inattention with use of visual aids to allow pt to self correct. Pt requires VC and occasional cues to attend to the left side of body and left side of his environment. Utilized [place] as well as every day items taped to mirror to increase awareness to left side. Pt pulled [place] off with right hand while standing. Due to lack of pinch strength, pt was unable to pull every day items off of mirror with left hand although he was able to reach and touch each item that was placed below shoulder level.     Recommendations for follow up therapy are one component of a multi-disciplinary discharge planning process, led by the attending physician.  Recommendations may be updated based on patient status, additional functional criteria and insurance authorization.    Follow Up Recommendations  Acute inpatient rehab (3hours/day)     Assistance Recommended at Discharge Frequent or constant Supervision/Assistance  Patient can return home with the following  Two people to help with walking and/or transfers;A lot of help with bathing/dressing/bathroom;Assistance with cooking/housework;Help with stairs or ramp for entrance;Assist for transportation   Equipment Recommendations  Other (comment) (defer to next venue of care)       Precautions / Restrictions Precautions Precautions:  Fall Precaution Comments: Left hemi; Left homonymous hemianopsia Restrictions Weight Bearing Restrictions: No       Mobility Bed Mobility Overal bed mobility: Needs Assistance Bed Mobility: Supine to Sit   Sidelying to sit: Max assist, HOB elevated       General bed mobility comments: MaxA to transition to sit L EOB with HOB elevated, cuing pt to push up on arm with assistance Patient Response: Cooperative  Transfers Overall transfer level: Needs assistance Equipment used: 2 person hand held assist, Rolling walker (2 wheels) Transfers: Sit to/from Stand, Bed to chair/wheelchair/BSC Sit to Stand: Mod assist, +2 safety/equipment, +2 physical assistance     Step pivot transfers: Max assist, +2 physical assistance, +2 safety/equipment     General transfer comment: Pt transferred to stand 3x from EOB with bil HHA, 1x from recliner with bil HHA, and 1x from recliner to RW, needing cues for bil UE placement either with HHA or on chair to pull/push up to stand and L knee blocked, modAx2. MaxAx2 for stability, L knee block during L stance, L foot guidance to step with L, weight shifting bil to step, and cues to pivot R foot as needed to stand step pivot to R from bed to chair with bil HHA. Mirror in front of pt throughout all transfers and standing trials to encourage midline alignment with noted good success as pt was able to self correct standing posture with visual aid.     Balance Overall balance assessment: Needs assistance Sitting-balance support: Bilateral upper extremity supported, Feet supported, Single extremity supported Sitting balance-Leahy Scale: Poor Sitting balance - Comments: pt able to maintain static sitting EOB with close  supervision once he gained his balance, mirror anterior to him to encourage midline alignment with noted good success Postural control: Left lateral lean, Posterior lean Standing balance support: Single extremity supported, Bilateral upper extremity  supported, Reliant on assistive device for balance, During functional activity Standing balance-Leahy Scale: Poor Standing balance comment: Reliant on UE support and modAx2 for standing with feet static and pt reaching off BOS with either UE to reach objects stuck to mirror anterior to him, L knee blocked throughout         ADL either performed or assessed with clinical judgement   ADL Overall ADL's : Needs assistance/impaired     Toileting- Clothing Manipulation and Hygiene: Total assistance;Bed level           Vision   Alignment/Gaze Preference: Head turned;Gaze right Visual Fields: Left homonymous hemianopsia   Perception Perception Perception: Impaired Inattention/Neglect: Does not attend to left visual field;Does not attend to left side of body Spatial Orientation: impaired   Praxis Praxis Praxis: Impaired Praxis Impairment Details: Motor planning;Initiation    Cognition Arousal/Alertness: Awake/alert Behavior During Therapy: WFL for tasks assessed/performed Overall Cognitive Status: Impaired/Different from baseline Area of Impairment: Attention, Awareness, Problem solving, Memory, Following commands, Safety/judgement       Current Attention Level: Selective Memory: Decreased short-term memory Following Commands: Follows one step commands with increased time, Follows one step commands consistently, Follows multi-step commands inconsistently Safety/Judgement: Decreased awareness of safety, Decreased awareness of deficits Awareness: Emergent Problem Solving: Slow processing, Difficulty sequencing, Requires verbal cues, Requires tactile cues General Comments: Pt needing cues to turn head and eyes to L to attend to objects or his own extremities on his L, often neglecting that side with poor awareness of L extremity protection. Able to detect which way he is leaning off midline, especially when provided visual input from a mirror, and correct it with min cues. Needs  multi-modal cues and step-by-step directions to sequence stepping.              General Comments Daughter encouraged to sit on Pt's left side to facilitate pt scanning to the left visual field more frequently during the day.    Pertinent Vitals/ Pain       Pain Assessment Pain Assessment: Faces Faces Pain Scale: Hurts a little bit Pain Location: back (itchy) Pain Descriptors / Indicators: Discomfort Pain Intervention(s): Monitored during session         Frequency  Min 2X/week        Progress Toward Goals  OT Goals(current goals can now be found in the care plan section)  Progress towards OT goals: Progressing toward goals     Plan Discharge plan remains appropriate;Frequency remains appropriate    Co-evaluation    PT/OT/SLP Co-Evaluation/Treatment: Yes Reason for Co-Treatment: For patient/therapist safety;To address functional/ADL transfers PT goals addressed during session: Mobility/safety with mobility;Balance OT goals addressed during session: ADL's and self-care;Other (comment);Strengthening/ROM (left inattention)      AM-PAC OT "6 Clicks" Daily Activity     Outcome Measure   Help from another person eating meals?: A Little Help from another person taking care of personal grooming?: A Little Help from another person toileting, which includes using toliet, bedpan, or urinal?: Total Help from another person bathing (including washing, rinsing, drying)?: A Lot Help from another person to put on and taking off regular upper body clothing?: A Lot Help from another person to put on and taking off regular lower body clothing?: Total 6 Click Score: 12    End of  Session Equipment Utilized During Treatment: Gait belt;Rolling walker (2 wheels);Other (comment) (floor length mirror)  OT Visit Diagnosis: Unsteadiness on feet (R26.81);Muscle weakness (generalized) (M62.81);Apraxia (R48.2);Hemiplegia and hemiparesis Hemiplegia - Right/Left: Left Hemiplegia -  dominant/non-dominant: Non-Dominant Hemiplegia - caused by: Cerebral infarction   Activity Tolerance Patient tolerated treatment well   Patient Left in chair;with call bell/phone within reach;with chair alarm set;with family/visitor present           Time: [id number] OT Time Calculation (min): 45 min  Charges: OT General Charges $OT Visit: 1 Visit OT Treatments $Neuromuscular Re-education: 23-37 mins  Ailene Ravel, OTR/L,CBIS  Supplemental OT - [facility] and [facility] Secure Chat Preferred    Rodney Yera, Clarene Duke 05/18/2022, 11:46 AM

## 2022-05-18 NOTE — Progress Notes (Signed)
Pt admitted to Rehab from 3W. Pt is AxO 1-2. Wife/Daughter at beside.

## 2022-05-18 NOTE — Progress Notes (Signed)
Inpatient Rehabilitation Admission Medication Review by a Pharmacist  A complete drug regimen review was completed for this patient to identify any potential clinically significant medication issues.  High Risk Drug Classes Is patient taking? Indication by Medication  Antipsychotic No   Anticoagulant Yes Apixaban- AF  Antibiotic No   Opioid No   Antiplatelet No   Hypoglycemics/insulin No   Vasoactive Medication Yes Norvasc, Toprol- HTN  Chemotherapy No   Other Yes Lipitor- HLD Trazodone- sleep     Type of Medication Issue Identified Description of Issue Recommendation(s)  Drug Interaction(s) (clinically significant)     Duplicate Therapy     Allergy     No Medication Administration End Date     Incorrect Dose     Additional Drug Therapy Needed     Significant med changes from prior encounter (inform family/care partners about these prior to discharge). Xarelto discontinued Will address discontinuation of Xarelto on the discharge med list and counsel patient prior to discharge from CIR  Other  PTA meds: Zocor Xarelto Zocor has been replaced with Lipitor Xarelto has been replaced with apixaban    Clinically significant medication issues were identified that warrant physician communication and completion of prescribed/recommended actions by midnight of the next day:  No  Time spent performing this drug regimen review (minutes):  30   Scarlettrose Costilow BS, PharmD, BCPS Clinical Pharmacist 05/19/2022 6:54 AM  Contact: [phone] after 3 PM  "Be curious, not judgmental..." -Jamal Maes

## 2022-05-18 NOTE — Plan of Care (Signed)
  Problem: Education: Goal: Knowledge of disease or condition will improve Outcome: Progressing   Problem: Coping: Goal: Will identify appropriate support needs Outcome: Progressing   Problem: Self-Care: Goal: Ability to communicate needs accurately will improve Outcome: Progressing   Problem: Nutrition: Goal: Risk of aspiration will decrease Outcome: Progressing Goal: Dietary intake will improve Outcome: Progressing   Problem: Health Behavior/Discharge Planning: Goal: Ability to manage health-related needs will improve Outcome: Not Progressing

## 2022-05-18 NOTE — Discharge Summary (Addendum)
Stroke Discharge Summary  Patient ID: Edward Maynard   MRN: [id number]      DOB: Mar 20, 1934  Date of Admission: 05/10/2022 Date of Discharge: 05/18/2022  Attending Physician:  Stroke, Md, MD, Stroke MD Consultant(s):   None Patient's PCP:  Gaynelle Arabian, MD  Discharge Diagnoses:  Principal Problem:  right large MCA infarct due to right M2 occlusion status post mechanical thrombectomy with  TICI2c revascularization, embolic secondary to A-fib even on Xarelto  Active Problems: Left hemiplegia Left homonymous hemianopsia Left hand weakness   Hyperlipidemia   Stroke (cerebrum) ([place])   Chronic a-fib ([facility])   Essential hypertension   Obesity   Advanced age   Dysphagia   CKD (chronic kidney disease)   Insomnia   Medications to be continued on Rehab Allergies as of 05/18/2022   No Known Allergies      Medication List     STOP taking these medications    simvastatin 40 MG tablet Commonly known as: ZOCOR   Xarelto 20 MG Tabs tablet Generic drug: rivaroxaban       TAKE these medications    amLODipine 10 MG tablet Commonly known as: NORVASC Take 1 tablet (10 mg total) by mouth daily. Start taking on: May 19, 2022   apixaban 5 MG Tabs tablet Commonly known as: ELIQUIS Take 1 tablet (5 mg total) by mouth 2 (two) times daily.   atorvastatin 20 MG tablet Commonly known as: LIPITOR Take 1 tablet (20 mg total) by mouth daily. Start taking on: May 19, 2022   docusate sodium 100 MG capsule Commonly known as: COLACE Take 1 capsule (100 mg total) by mouth daily. Start taking on: May 19, 2022   feeding supplement Liqd Take 237 mLs by mouth 3 (three) times daily between meals.   metoprolol succinate 25 MG 24 hr tablet Commonly known as: Toprol XL Take 1 tablet (25 mg total) by mouth daily. Start taking on: May 19, 2022 What changed: how much to take   traZODone 50 MG tablet Commonly known as: DESYREL Take 1 tablet (50 mg total) by mouth at bedtime as  needed for sleep.        LABORATORY STUDIES CBC    Component Value Date/Time   WBC 8.1 05/18/2022 0454   RBC 4.66 05/18/2022 0454   HGB 15.7 05/18/2022 0454   HGB 15.4 09/12/2021 1539   HCT 44.0 05/18/2022 0454   HCT 45.0 09/12/2021 1539   PLT 198 05/18/2022 0454   PLT 160 09/12/2021 1539   MCV 94.4 05/18/2022 0454   MCV 97 09/12/2021 1539   MCH 33.7 05/18/2022 0454   MCHC 35.7 05/18/2022 0454   RDW 13.2 05/18/2022 0454   RDW 12.8 09/12/2021 1539   LYMPHSABS 1.2 05/10/2022 0545   MONOABS 1.0 05/10/2022 0545   EOSABS 0.0 05/10/2022 0545   BASOSABS 0.0 05/10/2022 0545   CMP    Component Value Date/Time   NA 137 05/18/2022 0454   NA 142 09/12/2021 1539   K 3.8 05/18/2022 0454   CL 104 05/18/2022 0454   CO2 22 05/18/2022 0454   GLUCOSE 127 (H) 05/18/2022 0454   BUN 18 05/18/2022 0454   BUN 20 09/12/2021 1539   CREATININE 0.98 05/18/2022 0454   CALCIUM 9.1 05/18/2022 0454   PROT 7.0 05/10/2022 0005   PROT 6.9 10/01/2013 1534   ALBUMIN 3.5 05/11/2022 1246   ALBUMIN 4.4 10/01/2013 1534   AST 27 05/10/2022 0005   ALT 20 05/10/2022 0005  ALKPHOS 93 05/10/2022 0005   BILITOT 0.4 05/10/2022 0005   GFRNONAA >60 05/18/2022 0454   GFRAA 65 04/04/2018 0838   COAGS Lab Results  Component Value Date   INR 2.2 (H) 05/10/2022   INR 1.05 07/04/2013   Lipid Panel    Component Value Date/Time   CHOL 134 05/10/2022 0545   TRIG 190 (H) 05/10/2022 0545   HDL 40 (L) 05/10/2022 0545   CHOLHDL 3.4 05/10/2022 0545   VLDL 38 05/10/2022 0545   LDLCALC 56 05/10/2022 0545   HgbA1C  Lab Results  Component Value Date   HGBA1C 5.8 (H) 05/10/2022   Urinalysis No results found for: "COLORURINE", "APPEARANCEUR", "LABSPEC", "PHURINE", "GLUCOSEU", "HGBUR", "BILIRUBINUR", "KETONESUR", "PROTEINUR", "UROBILINOGEN", "NITRITE", "LEUKOCYTESUR" Urine Drug Screen No results found for: "LABOPIA", "COCAINSCRNUR", "LABBENZ", "AMPHETMU", "THCU", "LABBARB"  Alcohol Level    Component Value  Date/Time   ETH <10 05/10/2022 0005     SIGNIFICANT DIAGNOSTIC STUDIES CT HEAD WO CONTRAST (5MM)  Result Date: 05/16/2022 CLINICAL DATA:  Stroke follow-up EXAM: CT HEAD WITHOUT CONTRAST TECHNIQUE: Contiguous axial images were obtained from the base of the skull through the vertex without intravenous contrast. RADIATION DOSE REDUCTION: This exam was performed according to the departmental dose-optimization program which includes automated exposure control, adjustment of the mA and/or kV according to patient size and/or use of iterative reconstruction technique. COMPARISON:  05/10/2022 FINDINGS: Brain: Known acute infarct in the posterior division of the right MCA territory with petechial hemorrhage. No hematoma or malignant swelling. Chronic bilateral cerebellar and right thalamic infarcts. No hemorrhage, hydrocephalus, or masslike finding. Vascular: No new abnormality Skull: Negative Sinuses/Orbits: Negative Other: Partially covered right parotid mass measuring 15 mm. IMPRESSION: 1. No new abnormality. Large right posterior division MCA territory infarct with mild petechial hemorrhage. 2. Chronic cerebellar and thalamic small vessel infarcts. 3. 15 mm right parotid mass. Electronically Signed   By: Jorje Guild M.D.   On: 05/16/2022 07:03   DG Swallowing Func-Speech Pathology  Result Date: 05/11/2022 Table formatting from the original result was not included. Modified Barium Swallow Study Patient Details Name: Edward Maynard MRN: [id number] Date of Birth: February 09, 1934 Today's Date: 05/11/2022 HPI/PMH: HPI: 89 year old male with atrial fibrillation (on Xarelto), prior stroke, presenting with acute onset of left hemiplegia and left hemineglect after sustaining a fall to the left while at home this evening. Underwent thrombectomy Clinical Impression: Clinical Impression: Pt demonstrates moderate to severe oral dysphagia witn left CN VII weakness and sensory impairment leading to severe anterior spillage with  cup sips of liquids. Lingual, buccal and palatal residue present with most trials. Thin residue spills to pharynx post swallow. There is delayed swallow initaition with all liquids. No aspiration of thin occurred until pt attempted consecutive straw sips (PAS 7); however, there is higher risk with thin and severe anterior spillage. Nectar was better controlled orally and with a straw, which reduced oral residue and spillage. Pt unable to masticate solids without dentures and lingual manipulation of bolus very poor. Recommend purees and nectar thick liquids while pt learns strategies and improves awareness. Can quickly upgrade to thin as pt demonstrates awareness and control without severe coughing Factors that may increase risk of adverse event in presence of aspiration ([place] 2021): Factors that may increase risk of adverse event in presence of aspiration ([place] 2021): Limited mobility; Reduced cognitive function Recommendations/Plan: Swallowing Evaluation Recommendations Swallowing Evaluation Recommendations Liquid Administration via: Straw; Cup Medication Administration: Whole meds with puree Supervision: Full supervision/cueing for swallowing  strategies Swallowing strategies  : Slow rate; Small bites/sips; Minimize environmental distractions; Check for anterior loss Postural changes: Stay upright 30-60 min after meals Treatment Plan Treatment Plan Treatment recommendations: Therapy as outlined in treatment plan below Follow-up recommendations: Acute inpatient rehab (3 hours/day) Functional status assessment: Patient has had a recent decline in their functional status and demonstrates the ability to make significant improvements in function in a reasonable and predictable amount of time. Treatment frequency: Min 2x/week Treatment duration: 2 weeks Interventions: Aspiration precaution training; Diet toleration management by SLP; Trials of upgraded texture/liquids; Patient/family education;  Compensatory techniques Recommendations Recommendations for follow up therapy are one component of a multi-disciplinary discharge planning process, led by the attending physician.  Recommendations may be updated based on patient status, additional functional criteria and insurance authorization. Assessment: Orofacial Exam: Orofacial Exam Oral Cavity - Dentition: Dentures, top; Dentures, bottom Anatomy: Anatomy: WFL Thin Liquids: Thin Liquids (Level 0) Thin Liquids : Impaired Bolus delivery method: Cup Thin Liquid - Impairment: Oral Impairment; Pharyngeal impairment Lip Closure: Escape beyond mid-chin Tongue control during bolus hold: Escape to lateral buccal cavity/floor of mouth Bolus transport/lingual motion: Repetitive/disorganized tongue motion Oral residue: Residue collection on oral structures Location of oral residue : Floor of mouth; Lateral sulci; Tongue Initiation of swallow : Pyriform sinuses Soft palate elevation: No bolus between soft palate (SP)/pharyngeal wall (PW) Laryngeal elevation: Complete superior movement of thyroid cartilage with complete approximation of arytenoids to epiglottic petiole Anterior hyoid excursion: Complete Epiglottic movement: Complete Laryngeal vestibule closure: Complete, no air/contrast in laryngeal vestibule Pharyngeal stripping wave : Present - complete Pharyngeal contraction (A/P view only): N/A Pharyngoesophageal segment opening: Complete distension and complete duration, no obstruction of flow Tongue base retraction: Trace column of contrast or air between tongue base and PPW Pharyngeal residue: Trace residue within or on pharyngeal structures Location of pharyngeal residue: Valleculae Penetration/Aspiration Scale (PAS) score: 7.  Material enters airway, passes BELOW cords and not ejected out despite cough attempt by patient; 1.  Material does not enter airway (aspiration with straw only)  Mildly Thick Liquids: Mildly thick liquids (Level 2, nectar thick) Mildly thick  liquids (Level 2, nectar thick): Impaired Bolus delivery method: Cup Mildly Thick Liquid - Impairment: Oral Impairment; Pharyngeal impairment Lip Closure: Escape beyond mid-chin Tongue control during bolus hold: Escape to lateral buccal cavity/floor of mouth Bolus transport/lingual motion: Repetitive/disorganized tongue motion Oral residue: Residue collection on oral structures Location of oral residue : Lateral sulci; Floor of mouth Initiation of swallow : Pyriform sinuses Soft palate elevation: No bolus between soft palate (SP)/pharyngeal wall (PW) Laryngeal elevation: Complete superior movement of thyroid cartilage with complete approximation of arytenoids to epiglottic petiole Anterior hyoid excursion: Complete Epiglottic movement: Complete Laryngeal vestibule closure: Complete, no air/contrast in laryngeal vestibule Pharyngeal stripping wave : Present - complete Pharyngeal contraction (A/P view only): Complete; N/A Pharyngoesophageal segment opening: Complete distension and complete duration, no obstruction of flow Tongue base retraction: Trace column of contrast or air between tongue base and PPW Pharyngeal residue: Trace residue within or on pharyngeal structures Location of pharyngeal residue: Valleculae  Moderately Thick Liquids: Moderately thick liquids (Level 3, honey thick) Moderately thick liquids (Level 3, honey thick): Impaired Bolus delivery method: Spoon Moderately Thick Liquid - Impairment: Oral Impairment Lip Closure: Escape from interlabial space or lateral juncture, no extension beyond vermillion border Tongue control during bolus hold: Escape to lateral buccal cavity/floor of mouth Bolus transport/lingual motion: Repetitive/disorganized tongue motion Oral residue: Residue collection on oral structures Location of oral residue : Floor  of mouth; Lateral sulci Initiation of swallow : Posterior laryngeal surface of the epiglottis  Puree: Puree Puree: Impaired Puree - Impairment: Oral Impairment Lip  Closure: Interlabial escape, no progression to anterior lip Bolus transport/lingual motion: Repetitive/disorganized tongue motion Oral residue: Trace residue lining oral structures Location of oral residue : Tongue Initiation of swallow: Valleculae Solid: Solid Solid: Impaired Solid - Impairment: Oral Impairment Lip Closure: Escape beyond mid-chin Bolus preparation/mastication: Minimal chewing/mashing with majority of bolus unchewed Bolus transport/lingual motion: Repetitive/disorganized tongue motion Oral residue: Majority of bolus remaining Location of oral residue : Lateral sulci Pill: Pill Pill: Not Tested Compensatory Strategies: Compensatory Strategies Compensatory strategies: Yes Straw: Effective Effective Straw: Mildly thick liquid (Level 2, nectar thick) (aspiration with thin)   General Information: Caregiver present: No  Diet Prior to this Study: NPO   No data recorded  No data recorded  Supplemental O2: None (Room air)   History of Recent Intubation: No  Behavior/Cognition: Alert; Cooperative; Pleasant mood No data recorded Baseline vocal quality/speech: Normal Volitional Cough: Able to elicit Volitional Swallow: Able to elicit No data recorded Goal Planning: Prognosis for improved oropharyngeal function: Good Barriers to Reach Goals: Cognitive deficits No data recorded No data recorded Consulted and agree with results and recommendations: Patient; Family member/caregiver Pain: Pain Assessment Pain Assessment: Faces Faces Pain Scale: 0 Pain Intervention(s): Monitored during session End of Session: Start Time:SLP Start Time (ACUTE ONLY): 1345 Stop Time: SLP Stop Time (ACUTE ONLY): 1405 Time Calculation:SLP Time Calculation (min) (ACUTE ONLY): 20 min Charges: SLP Evaluations $ SLP Speech Visit: 1 Visit SLP Evaluations $BSS Swallow: 1 Procedure $MBS Swallow: 1 Procedure $ SLP EVAL LANGUAGE/SOUND PRODUCTION: 1 Procedure $Swallowing Treatment: 1 Procedure $Speech Treatment for Individual: 1 Procedure SLP visit  diagnosis: SLP Visit Diagnosis: Dysphagia, oropharyngeal phase (R13.12) Past Medical History: Past Medical History: Diagnosis Date  High cholesterol   05/2013; no defecits  Hyperlipemia   Left arm weakness   Left hand weakness   Personal history of colonic polyp - adenoma  10/23/2013  Stroke ([place]) 05/2013  MRI on 07/02/13 = Multifocal acute & subacute infarction involving right frontal MCA/ACA & left parietal MCA/PCA watershed areas Past Surgical History: Past Surgical History: Procedure Laterality Date  IR CT HEAD LTD  05/10/2022  IR PERCUTANEOUS ART THROMBECTOMY/INFUSION INTRACRANIAL INC DIAG ANGIO  05/10/2022  LOOP RECORDER IMPLANT N/A 10/31/2013  Procedure: LOOP RECORDER IMPLANT;  Surgeon: Coralyn Mark, MD;  Location: [facility];  Service: Cardiovascular;  Laterality: N/A;  RADIOLOGY WITH ANESTHESIA N/A 05/10/2022  Procedure: IR WITH ANESTHESIA;  Surgeon: Luanne Bras, MD;  Location: [facility];  Service: Radiology;  Laterality: N/A;  SHOULDER SURGERY Bilateral 2003, 1995 [place], Katherene Ponto 05/11/2022, 2:20 PM  IR PERCUTANEOUS ART THROMBECTOMY/INFUSION INTRACRANIAL INC DIAG ANGIO  Result Date: 05/11/2022 INDICATION: Worsening left-sided weakness and right gaze deviation. Occluded distal M2 segment of the inferior division of the right middle cerebral artery on CT angiogram of the head and neck. EXAM: 1. EMERGENT LARGE VESSEL OCCLUSION THROMBOLYSIS anterior CIRCULATION) COMPARISON:  CT angiogram of the head and neck of May 10, 2022. MEDICATIONS: Ancef 2 g IV antibiotic was administered within 1 hour of the procedure. ANESTHESIA/SEDATION: General anesthesia. CONTRAST:  Omnipaque 300 approximately 70 mL. FLUOROSCOPY TIME:  Fluoroscopy Time: 41 minutes 16 seconds (2313 mGy). COMPLICATIONS: None immediate. TECHNIQUE: Following a full explanation of the procedure along with the potential associated complications, an informed witnessed consent was obtained. The risks of intracranial hemorrhage of 10%,  worsening neurological deficit, ventilator dependency, death and  inability to revascularize were all reviewed in detail with the patient's spouse. The patient was then put under general anesthesia by the Department of Anesthesiology at [facility]. The right groin was prepped and draped in the usual sterile fashion. Thereafter using modified Seldinger technique, transfemoral access into the right common femoral artery was obtained without difficulty. Over a 0.035 inch guidewire an 8 French 25 cm Pinnacle sheath was inserted. Through this a combination of a 125 cm 5 [place] Berenstein support catheter inside of an 087 95 cm balloon guide catheter was advanced initially proximal to the right common carotid bifurcation and then into the distal right internal carotid artery. FINDINGS: The right common carotid bifurcation demonstrates the right external carotid artery and its major branches to be widely patent. The right internal carotid artery at the bulb demonstrates a smooth shallow plaque along the posterior wall associated with 10% stenosis by the NASCET criteria. Distal to this the right internal carotid artery demonstrates opacification to the cranial skull base. The petrous, the cavernous and the supraclinoid right ICA demonstrate wide patency. A right posterior communicating artery is seen opacifying the right posterior cerebral artery distribution. The right anterior cerebral artery opacifies into the capillary and venous phases. The right middle cerebral artery M1 segment is widely patent. There is an occlusion of a parietal branch of the inferior division of the right middle cerebral artery in the distal M3 region. Delayed arterial and capillary phase demonstrates significant retrograde opacification of the distal right parietal cortical and subcortical distribution from the anterior cerebral and the P3 leptomeningeal branches of the right posterior cerebral artery. PROCEDURE: Through the balloon guide  catheter in the distal right internal carotid artery, a combination of an 045 Zoom aspiration catheter with a 160 cm Trevo microcatheter was advanced over an 014 inch soft tip micro guidewire with a moderate J configuration to the supraclinoid right ICA. The micro guidewire was then gently manipulated with a torque device and advanced without difficulty distal to the occluded branch of the inferior division into the M3 region followed by the microcatheter. A 3 mm x 20 mm Solitaire X retrieval device was then deployed following verification of safe positioning of the tip of the microcatheter. This was then followed by the advancement of the Zoom aspiration catheter which was engaged just inside the occluded portion of the vessel, the micro guidewire and the microcatheter were retrieved proximally. Following 2 minutes of contact aspiration with a Penumbra aspiration device and proximal flow arrest, the Zoom aspiration catheter was removed. Two approximately 1 mm pearly sticky clots were retrieved. A control arteriogram demonstrated no significant change in the occluded parietal branch. A second pass was then made using the above combination. The microcatheter was advanced past the occlusion into the M3 region with the micro guidewire. The guidewire was removed. Good aspiration obtained from the hub of the microcatheter which was then connected to continuous heparinized saline infusion. The Zoom aspiration catheter was then engaged just inside the occluded segment of the vessel. A 3 mm x 20 mm Solitaire X retrieval device was then deployed in the usual manner. Thereafter, constant aspiration was applied with proximal flow arrest the hub of the Zoom aspiration catheter for a minute and a half. Following this the combination of the retrieval device, the microcatheter and the aspiration catheter were removed. A control arteriogram performed through the balloon guide catheter following reversal of flow arrest now  demonstrated improved flow through the occluded branch though slow to the M3  M4 regions. A third pass was then made using a combination using an 035 inch aspiration catheter advanced over an 014 inch soft tip micro guidewire with a moderate J configuration to the right middle cerebral artery. Access into the now reoccluded inferior division M2 branch was then made with the micro guidewire followed by the microcatheter into the distal M3 region. The guidewire was removed. The 035 inch Zoom aspiration was then engaged into the clot with no aspiration obtained. Aspiration was then applied with a Penumbra aspiration device for approximately 2 minutes with proximal flow arrest. The aspiration catheter was removed. A control arteriogram performed through the balloon guide catheter demonstrated mild improvement in revascularization of the occluded branch with the clot noted slightly more distally. A fourth pass was then made this time again using an 045 Zoom aspiration catheter advanced with an 021 microcatheter and over 014 inch micro guidewire. This combination was then advanced into the M3 region of the occluded branch followed by the microcatheter. The guidewire was removed. Good aspiration obtained from the hub of the microcatheter which was positioned distally in the M3 segment. This was then connected to continuous heparinized saline infusion. A 4 mm x 40 mm Solitaire X retrieval stent was then deployed in the usual manner with the 045 Zoom aspiration catheter imbedded in the occluded branch. Aspiration was then for approximately 2 minutes at the hub of the aspiration catheter with proximal flow arrest. Thereafter, the retriever, the microcatheter and the Zoom aspiration catheter were removed. A control arteriogram performed following reversal of flow arrest in the right internal carotid artery, continued to demonstrate slow antegrade flow distal to the occluded segment. A control arteriogram performed through the  balloon guide catheter continued to demonstrate a TICI 2C over revascularization with only mild distal flow past the occluded M2 branch of the right inferior division MCA. No further passes were made due to now steadily increased risk of vessel injury perforation. The balloon guide was retrieved in the right common carotid artery. A control arteriogram performed through this continued to demonstrate patency of right internal carotid artery proximally and distally including that of the right posterior communicating artery, the right anterior cerebral artery and the right middle cerebral artery distributions. An 8 French Angio-Seal closure device was then deployed in the right groin puncture site for hemostasis. Distal pulses remained Dopplerable in both feet unchanged from prior to the procedure. A flat panel CT of the brain demonstrated no evidence of intra cerebral hemorrhage. The patient's general anesthesia was reversed, and patient was extubated. Initially agitated, the patient gradually settled down to where simple commands appropriately and verbalized appropriately. The patient moved his left lower extremities spontaneously and to command with good strength. Slight movement was evident in the left upper extremity. The patient was then transferred to the neuro ICU for post revascularization management. IMPRESSION: Status post endovascular revascularization of occluded distal M2 branch of the right middle cerebral artery inferior division with 2 passes with a 3 mm x 20 mm retrieval device and contact aspiration, 1 pass with a 4 mm x 40 mm Solitaire X retrieval device and contact aspiration, 1 pass with contact aspiration with minimal improvement in revascularization of the occluded right MCA inferior division M2 segment occlusion. A TICI 2C revascularization was maintained. PLAN: Follow-up as per referring MD. Electronically Signed   By: Luanne Bras M.D.   On: 05/11/2022 08:28   IR CT Head Ltd  Result  Date: 05/11/2022 INDICATION: Worsening left-sided weakness and right  gaze deviation. Occluded distal M2 segment of the inferior division of the right middle cerebral artery on CT angiogram of the head and neck. EXAM: 1. EMERGENT LARGE VESSEL OCCLUSION THROMBOLYSIS anterior CIRCULATION) COMPARISON:  CT angiogram of the head and neck of May 10, 2022. MEDICATIONS: Ancef 2 g IV antibiotic was administered within 1 hour of the procedure. ANESTHESIA/SEDATION: General anesthesia. CONTRAST:  Omnipaque 300 approximately 70 mL. FLUOROSCOPY TIME:  Fluoroscopy Time: 41 minutes 16 seconds (2313 mGy). COMPLICATIONS: None immediate. TECHNIQUE: Following a full explanation of the procedure along with the potential associated complications, an informed witnessed consent was obtained. The risks of intracranial hemorrhage of 10%, worsening neurological deficit, ventilator dependency, death and inability to revascularize were all reviewed in detail with the patient's spouse. The patient was then put under general anesthesia by the Department of Anesthesiology at [facility]. The right groin was prepped and draped in the usual sterile fashion. Thereafter using modified Seldinger technique, transfemoral access into the right common femoral artery was obtained without difficulty. Over a 0.035 inch guidewire an 8 French 25 cm Pinnacle sheath was inserted. Through this a combination of a 125 cm 5 [place] Berenstein support catheter inside of an 087 95 cm balloon guide catheter was advanced initially proximal to the right common carotid bifurcation and then into the distal right internal carotid artery. FINDINGS: The right common carotid bifurcation demonstrates the right external carotid artery and its major branches to be widely patent. The right internal carotid artery at the bulb demonstrates a smooth shallow plaque along the posterior wall associated with 10% stenosis by the NASCET criteria. Distal to this the right internal  carotid artery demonstrates opacification to the cranial skull base. The petrous, the cavernous and the supraclinoid right ICA demonstrate wide patency. A right posterior communicating artery is seen opacifying the right posterior cerebral artery distribution. The right anterior cerebral artery opacifies into the capillary and venous phases. The right middle cerebral artery M1 segment is widely patent. There is an occlusion of a parietal branch of the inferior division of the right middle cerebral artery in the distal M3 region. Delayed arterial and capillary phase demonstrates significant retrograde opacification of the distal right parietal cortical and subcortical distribution from the anterior cerebral and the P3 leptomeningeal branches of the right posterior cerebral artery. PROCEDURE: Through the balloon guide catheter in the distal right internal carotid artery, a combination of an 045 Zoom aspiration catheter with a 160 cm Trevo microcatheter was advanced over an 014 inch soft tip micro guidewire with a moderate J configuration to the supraclinoid right ICA. The micro guidewire was then gently manipulated with a torque device and advanced without difficulty distal to the occluded branch of the inferior division into the M3 region followed by the microcatheter. A 3 mm x 20 mm Solitaire X retrieval device was then deployed following verification of safe positioning of the tip of the microcatheter. This was then followed by the advancement of the Zoom aspiration catheter which was engaged just inside the occluded portion of the vessel, the micro guidewire and the microcatheter were retrieved proximally. Following 2 minutes of contact aspiration with a Penumbra aspiration device and proximal flow arrest, the Zoom aspiration catheter was removed. Two approximately 1 mm pearly sticky clots were retrieved. A control arteriogram demonstrated no significant change in the occluded parietal branch. A second pass was  then made using the above combination. The microcatheter was advanced past the occlusion into the M3 region with the micro guidewire. The  guidewire was removed. Good aspiration obtained from the hub of the microcatheter which was then connected to continuous heparinized saline infusion. The Zoom aspiration catheter was then engaged just inside the occluded segment of the vessel. A 3 mm x 20 mm Solitaire X retrieval device was then deployed in the usual manner. Thereafter, constant aspiration was applied with proximal flow arrest the hub of the Zoom aspiration catheter for a minute and a half. Following this the combination of the retrieval device, the microcatheter and the aspiration catheter were removed. A control arteriogram performed through the balloon guide catheter following reversal of flow arrest now demonstrated improved flow through the occluded branch though slow to the M3 M4 regions. A third pass was then made using a combination using an 035 inch aspiration catheter advanced over an 014 inch soft tip micro guidewire with a moderate J configuration to the right middle cerebral artery. Access into the now reoccluded inferior division M2 branch was then made with the micro guidewire followed by the microcatheter into the distal M3 region. The guidewire was removed. The 035 inch Zoom aspiration was then engaged into the clot with no aspiration obtained. Aspiration was then applied with a Penumbra aspiration device for approximately 2 minutes with proximal flow arrest. The aspiration catheter was removed. A control arteriogram performed through the balloon guide catheter demonstrated mild improvement in revascularization of the occluded branch with the clot noted slightly more distally. A fourth pass was then made this time again using an 045 Zoom aspiration catheter advanced with an 021 microcatheter and over 014 inch micro guidewire. This combination was then advanced into the M3 region of the occluded  branch followed by the microcatheter. The guidewire was removed. Good aspiration obtained from the hub of the microcatheter which was positioned distally in the M3 segment. This was then connected to continuous heparinized saline infusion. A 4 mm x 40 mm Solitaire X retrieval stent was then deployed in the usual manner with the 045 Zoom aspiration catheter imbedded in the occluded branch. Aspiration was then for approximately 2 minutes at the hub of the aspiration catheter with proximal flow arrest. Thereafter, the retriever, the microcatheter and the Zoom aspiration catheter were removed. A control arteriogram performed following reversal of flow arrest in the right internal carotid artery, continued to demonstrate slow antegrade flow distal to the occluded segment. A control arteriogram performed through the balloon guide catheter continued to demonstrate a TICI 2C over revascularization with only mild distal flow past the occluded M2 branch of the right inferior division MCA. No further passes were made due to now steadily increased risk of vessel injury perforation. The balloon guide was retrieved in the right common carotid artery. A control arteriogram performed through this continued to demonstrate patency of right internal carotid artery proximally and distally including that of the right posterior communicating artery, the right anterior cerebral artery and the right middle cerebral artery distributions. An 8 French Angio-Seal closure device was then deployed in the right groin puncture site for hemostasis. Distal pulses remained Dopplerable in both feet unchanged from prior to the procedure. A flat panel CT of the brain demonstrated no evidence of intra cerebral hemorrhage. The patient's general anesthesia was reversed, and patient was extubated. Initially agitated, the patient gradually settled down to where simple commands appropriately and verbalized appropriately. The patient moved his left lower  extremities spontaneously and to command with good strength. Slight movement was evident in the left upper extremity. The patient was then transferred to  the neuro ICU for post revascularization management. IMPRESSION: Status post endovascular revascularization of occluded distal M2 branch of the right middle cerebral artery inferior division with 2 passes with a 3 mm x 20 mm retrieval device and contact aspiration, 1 pass with a 4 mm x 40 mm Solitaire X retrieval device and contact aspiration, 1 pass with contact aspiration with minimal improvement in revascularization of the occluded right MCA inferior division M2 segment occlusion. A TICI 2C revascularization was maintained. PLAN: Follow-up as per referring MD. Electronically Signed   By: Luanne Bras M.D.   On: 05/11/2022 08:28   MR BRAIN WO CONTRAST  Result Date: 05/10/2022 CLINICAL DATA:  Stroke, follow up EXAM: MRI HEAD WITHOUT CONTRAST MRA HEAD WITHOUT CONTRAST TECHNIQUE: Multiplanar, multi-echo pulse sequences of the brain and surrounding structures were acquired without intravenous contrast. Angiographic images of the Circle of Willis were acquired using MRA technique without intravenous contrast. COMPARISON:  CTA and CT head from same day. FINDINGS: MRI HEAD FINDINGS Motion limited study. Brain: Acute posterior right MCA territory infarct. Associated edema and regional mass effect without significant midline shift. No mass occupying acute hemorrhage. No mass lesion. No hydrocephalus. Remote infarct in the right cerebellum. Vascular: Detailed below. Skull and upper cervical spine: Normal marrow signal. Sinuses/Orbits: Clear sinuses.  No acute orbital findings. MRA HEAD FINDINGS Severely motion limited study. Anterior circulation: Bilateral intracranial ICAs, proximal ACAs and proximal M1 MCAs appear grossly patent. Essentially nondiagnostic evaluation of the M2 MCAs, including the region of occlusion seen on recent CTA. Posterior circulation: The  intradural vertebral arteries, basilar arteries and proximal PCAs are grossly patent. IMPRESSION: 1. Acute right posterior MCA territory infarct. Associated edema without midline shift. 2. Severely motion limited MRA with nondiagnostic evaluation of the right M2 MCA in the region of recently seen occlusion on CTA. A repeat exclude exam may be able to better assess if the patient is able in clinically warranted. Electronically Signed   By: Margaretha Sheffield M.D.   On: 05/10/2022 16:03   MR ANGIO HEAD WO CONTRAST  Result Date: 05/10/2022 CLINICAL DATA:  Stroke, follow up EXAM: MRI HEAD WITHOUT CONTRAST MRA HEAD WITHOUT CONTRAST TECHNIQUE: Multiplanar, multi-echo pulse sequences of the brain and surrounding structures were acquired without intravenous contrast. Angiographic images of the Circle of Willis were acquired using MRA technique without intravenous contrast. COMPARISON:  CTA and CT head from same day. FINDINGS: MRI HEAD FINDINGS Motion limited study. Brain: Acute posterior right MCA territory infarct. Associated edema and regional mass effect without significant midline shift. No mass occupying acute hemorrhage. No mass lesion. No hydrocephalus. Remote infarct in the right cerebellum. Vascular: Detailed below. Skull and upper cervical spine: Normal marrow signal. Sinuses/Orbits: Clear sinuses.  No acute orbital findings. MRA HEAD FINDINGS Severely motion limited study. Anterior circulation: Bilateral intracranial ICAs, proximal ACAs and proximal M1 MCAs appear grossly patent. Essentially nondiagnostic evaluation of the M2 MCAs, including the region of occlusion seen on recent CTA. Posterior circulation: The intradural vertebral arteries, basilar arteries and proximal PCAs are grossly patent. IMPRESSION: 1. Acute right posterior MCA territory infarct. Associated edema without midline shift. 2. Severely motion limited MRA with nondiagnostic evaluation of the right M2 MCA in the region of recently seen  occlusion on CTA. A repeat exclude exam may be able to better assess if the patient is able in clinically warranted. Electronically Signed   By: Margaretha Sheffield M.D.   On: 05/10/2022 16:03   ECHOCARDIOGRAM COMPLETE  Result Date: 05/10/2022  ECHOCARDIOGRAM REPORT   Patient Name:   Edward Maynard Date of Exam: 05/10/2022 Medical Rec #:  [id number]     Height:       71.5 in Accession #:    [id number]    Weight:       223.1 lb Date of Birth:  [birth year]    BSA:          2.220 m Patient Age:    32 years      BP:           160/90 mmHg Patient Gender: M             HR:           73 bpm. Exam Location:  Inpatient Procedure: 2D Echo, Cardiac Doppler and Color Doppler Indications:    Stroke I63.9  History:        Patient has prior history of Echocardiogram examinations and                 Patient has no prior history of Echocardiogram examinations.                 Pacemaker, Stroke, Arrythmias:PVC and Tachycardia; Risk                 Factors:Dyslipidemia.  Sonographer:    Ronny Flurry Referring Phys: [phone] ERIC LINDZEN  Sonographer Comments: Image acquisition challenging due to respiratory motion. IMPRESSIONS  1. Left ventricular ejection fraction, by estimation, is 60 to 65%. The left ventricle has normal function. The left ventricle has no regional wall motion abnormalities. Left ventricular diastolic parameters are consistent with Grade II diastolic dysfunction (pseudonormalization).  2. Right ventricular systolic function is normal. The right ventricular size is normal. There is normal pulmonary artery systolic pressure.  3. The mitral valve is normal in structure. No evidence of mitral valve regurgitation. No evidence of mitral stenosis.  4. The aortic valve is calcified. Aortic valve regurgitation is trivial. Mild aortic valve stenosis. Aortic valve area, by VTI measures 1.55 cm. Aortic valve mean gradient measures 19.0 mmHg. Aortic valve Vmax measures 2.94 m/s.  5. The inferior vena cava is normal in size  with greater than 50% respiratory variability, suggesting right atrial pressure of 3 mmHg. FINDINGS  Left Ventricle: Left ventricular ejection fraction, by estimation, is 60 to 65%. The left ventricle has normal function. The left ventricle has no regional wall motion abnormalities. The left ventricular internal cavity size was normal in size. There is  no left ventricular hypertrophy. Left ventricular diastolic parameters are consistent with Grade II diastolic dysfunction (pseudonormalization). Indeterminate filling pressures. Right Ventricle: The right ventricular size is normal. No increase in right ventricular wall thickness. Right ventricular systolic function is normal. There is normal pulmonary artery systolic pressure. The tricuspid regurgitant velocity is 1.45 m/s, and  with an assumed right atrial pressure of 3 mmHg, the estimated right ventricular systolic pressure is [id number] mmHg. Left Atrium: Left atrial size was normal in size. Right Atrium: Right atrial size was normal in size. Pericardium: There is no evidence of pericardial effusion. Mitral Valve: The mitral valve is normal in structure. No evidence of mitral valve regurgitation. No evidence of mitral valve stenosis. Tricuspid Valve: The tricuspid valve is normal in structure. Tricuspid valve regurgitation is trivial. No evidence of tricuspid stenosis. Aortic Valve: The aortic valve is calcified. Aortic valve regurgitation is trivial. Mild aortic stenosis is present. Aortic valve mean gradient measures 19.0 mmHg. Aortic valve peak gradient measures 34.5 mmHg. Aortic  valve area, by VTI measures 1.55 cm. Pulmonic Valve: The pulmonic valve was normal in structure. Pulmonic valve regurgitation is not visualized. No evidence of pulmonic stenosis. Aorta: The aortic root is normal in size and structure. Venous: The inferior vena cava is normal in size with greater than 50% respiratory variability, suggesting right atrial pressure of 3 mmHg. IAS/Shunts: No  atrial level shunt detected by color flow Doppler.  LEFT VENTRICLE PLAX 2D LVIDd:         4.60 cm   Diastology LVIDs:         3.70 cm   LV e' medial:    7.80 cm/s LV PW:         1.30 cm   LV E/e' medial:  10.4 LV IVS:        1.20 cm   LV e' lateral:   11.60 cm/s LVOT diam:     2.20 cm   LV E/e' lateral: 7.0 LV SV:         89 LV SV Index:   40 LVOT Area:     3.80 cm  RIGHT VENTRICLE             IVC RV S prime:     16.60 cm/s  IVC diam: 2.10 cm TAPSE (M-mode): 1.5 cm LEFT ATRIUM             Index        RIGHT ATRIUM           Index LA diam:        4.60 cm 2.07 cm/m   RA Area:     15.10 cm LA Vol (A2C):   64.7 ml 29.14 ml/m  RA Volume:   31.80 ml  14.32 ml/m LA Vol (A4C):   58.7 ml 26.44 ml/m LA Biplane Vol: 62.9 ml 28.33 ml/m  AORTIC VALVE AV Area (Vmax):    1.61 cm AV Area (Vmean):   1.68 cm AV Area (VTI):     1.55 cm AV Vmax:           293.50 cm/s AV Vmean:          189.800 cm/s AV VTI:            0.570 m AV Peak Grad:      34.5 mmHg AV Mean Grad:      19.0 mmHg LVOT Vmax:         124.00 cm/s LVOT Vmean:        83.833 cm/s LVOT VTI:          0.233 m LVOT/AV VTI ratio: 0.41  AORTA Ao Root diam: 3.30 cm MITRAL VALVE               TRICUSPID VALVE MV Area (PHT): 4.68 cm    TR Peak grad:   8.4 mmHg MV Decel Time: 162 msec    TR Vmax:        145.00 cm/s MV E velocity: 81.00 cm/s MV A velocity: 65.50 cm/s  SHUNTS MV E/A ratio:  1.24        Systemic VTI:  0.23 m                            Systemic Diam: 2.20 cm Skeet Latch MD Electronically signed by Skeet Latch MD Signature Date/Time: 05/10/2022/10:48:39 AM    Final    CT HEAD CODE STROKE WO CONTRAST  Result Date: 05/10/2022 CLINICAL DATA:  Acute neurologic deficit EXAM:  CT HEAD WITHOUT CONTRAST CT CERVICAL SPINE WITHOUT CONTRAST TECHNIQUE: Multidetector CT imaging of the head and cervical spine was performed following the standard protocol without intravenous contrast. Multiplanar CT image reconstructions of the cervical spine were also generated.  RADIATION DOSE REDUCTION: This exam was performed according to the departmental dose-optimization program which includes automated exposure control, adjustment of the mA and/or kV according to patient size and/or use of iterative reconstruction technique. COMPARISON:  None Available. FINDINGS: CT HEAD FINDINGS Brain: There is no mass, hemorrhage or extra-axial collection. Generalized atrophy. Multiple old small vessel infarcts of the cerebellum. There is hypoattenuation of the periventricular white matter, most commonly indicating chronic ischemic microangiopathy. Old right frontal and parietal infarcts. Vascular: No abnormal hyperdensity of the major intracranial arteries or dural venous sinuses. No intracranial atherosclerosis. Skull: The visualized skull base, calvarium and extracranial soft tissues are normal. Sinuses/Orbits: No fluid levels or advanced mucosal thickening of the visualized paranasal sinuses. No mastoid or middle ear effusion. The orbits are normal. ASPECTS ([place] Stroke Program Early CT Score) - Ganglionic level infarction (caudate, lentiform nuclei, internal capsule, insula, M1-M3 cortex): 7 - Supraganglionic infarction (M4-M6 cortex): 3 Total score (0-10 with 10 being normal): 10 CT CERVICAL SPINE FINDINGS Alignment: No static subluxation. Facets are aligned. Occipital condyles and the lateral masses of C1 and C2 are normally approximated. Skull base and vertebrae: No acute fracture. Soft tissues and spinal canal: No prevertebral fluid or swelling. No visible canal hematoma. Disc levels: No advanced spinal canal or neural foraminal stenosis. Upper chest: No pneumothorax, pulmonary nodule or pleural effusion. Other: Normal visualized paraspinal cervical soft tissues. IMPRESSION: 1. No acute intracranial abnormality. 2. ASPECTS is 10. 3. No acute fracture or static subluxation of the cervical spine. These results were communicated to Dr. Kerney Elbe at 12:24 am on 05/10/2022 by text page via  the [facility] messaging system. Electronically Signed   By: Ulyses Jarred M.D.   On: 05/10/2022 00:47   CT C-SPINE NO CHARGE  Result Date: 05/10/2022 CLINICAL DATA:  Acute neurologic deficit EXAM: CT HEAD WITHOUT CONTRAST CT CERVICAL SPINE WITHOUT CONTRAST TECHNIQUE: Multidetector CT imaging of the head and cervical spine was performed following the standard protocol without intravenous contrast. Multiplanar CT image reconstructions of the cervical spine were also generated. RADIATION DOSE REDUCTION: This exam was performed according to the departmental dose-optimization program which includes automated exposure control, adjustment of the mA and/or kV according to patient size and/or use of iterative reconstruction technique. COMPARISON:  None Available. FINDINGS: CT HEAD FINDINGS Brain: There is no mass, hemorrhage or extra-axial collection. Generalized atrophy. Multiple old small vessel infarcts of the cerebellum. There is hypoattenuation of the periventricular white matter, most commonly indicating chronic ischemic microangiopathy. Old right frontal and parietal infarcts. Vascular: No abnormal hyperdensity of the major intracranial arteries or dural venous sinuses. No intracranial atherosclerosis. Skull: The visualized skull base, calvarium and extracranial soft tissues are normal. Sinuses/Orbits: No fluid levels or advanced mucosal thickening of the visualized paranasal sinuses. No mastoid or middle ear effusion. The orbits are normal. ASPECTS ([place] Stroke Program Early CT Score) - Ganglionic level infarction (caudate, lentiform nuclei, internal capsule, insula, M1-M3 cortex): 7 - Supraganglionic infarction (M4-M6 cortex): 3 Total score (0-10 with 10 being normal): 10 CT CERVICAL SPINE FINDINGS Alignment: No static subluxation. Facets are aligned. Occipital condyles and the lateral masses of C1 and C2 are normally approximated. Skull base and vertebrae: No acute fracture. Soft tissues and spinal canal: No  prevertebral fluid or swelling. No visible  canal hematoma. Disc levels: No advanced spinal canal or neural foraminal stenosis. Upper chest: No pneumothorax, pulmonary nodule or pleural effusion. Other: Normal visualized paraspinal cervical soft tissues. IMPRESSION: 1. No acute intracranial abnormality. 2. ASPECTS is 10. 3. No acute fracture or static subluxation of the cervical spine. These results were communicated to Dr. Kerney Elbe at 12:24 am on 05/10/2022 by text page via the [facility] messaging system. Electronically Signed   By: Ulyses Jarred M.D.   On: 05/10/2022 00:47   CT ANGIO HEAD NECK W WO CM (CODE STROKE)  Result Date: 05/10/2022 CLINICAL DATA:  Acute neurologic deficit EXAM: CT ANGIOGRAPHY HEAD AND NECK TECHNIQUE: Multidetector CT imaging of the head and neck was performed using the standard protocol during bolus administration of intravenous contrast. Multiplanar CT image reconstructions and MIPs were obtained to evaluate the vascular anatomy. Carotid stenosis measurements (when applicable) are obtained utilizing NASCET criteria, using the distal internal carotid diameter as the denominator. RADIATION DOSE REDUCTION: This exam was performed according to the departmental dose-optimization program which includes automated exposure control, adjustment of the mA and/or kV according to patient size and/or use of iterative reconstruction technique. COMPARISON:  None Available. FINDINGS: CTA NECK FINDINGS SKELETON: There is no bony spinal canal stenosis. No lytic or blastic lesion. OTHER NECK: 12 mm right parotid nodule (series 7 image 81). UPPER CHEST: No pneumothorax or pleural effusion. No nodules or masses. AORTIC ARCH: There is calcific atherosclerosis of the aortic arch. There is no aneurysm, dissection or hemodynamically significant stenosis of the visualized portion of the aorta. Normal variant aortic arch branching pattern with the left vertebral artery arising independently from the aortic arch.  The visualized proximal subclavian arteries are widely patent. RIGHT CAROTID SYSTEM: Atherosclerotic web at the bifurcation. Mixed density plaque with less than 50% stenosis. Widely patent ICA. LEFT CAROTID SYSTEM: Calcific atherosclerosis at the bifurcation without hemodynamically significant stenosis. VERTEBRAL ARTERIES: Right dominant configuration. Both origins are clearly patent. There is no dissection, occlusion or flow-limiting stenosis to the skull base (V1-V3 segments). CTA HEAD FINDINGS POSTERIOR CIRCULATION: --Vertebral arteries: Right V4 segment atherosclerosis with widely maintained patency. --Inferior cerebellar arteries: Normal. --Basilar artery: Normal. --Superior cerebellar arteries: Normal. --Posterior cerebral arteries (PCA): Normal. ANTERIOR CIRCULATION: --Intracranial internal carotid arteries: Atherosclerotic calcification of the internal carotid arteries at the skull base without hemodynamically significant stenosis. --Anterior cerebral arteries (ACA): Normal. Both A1 segments are present. Patent anterior communicating artery (a-comm). --Middle cerebral arteries (MCA): There is occlusion of the proximal M2 segment inferior division of the right MCA (series 10, image 114). Left MCA is normal. VENOUS SINUSES: As permitted by contrast timing, patent. ANATOMIC VARIANTS: Fetal origin of the right posterior cerebral artery. Review of the MIP images confirms the above findings. IMPRESSION: 1. Occlusion of the proximal M2 segment inferior division of the right MCA. 2. 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 3. Nonspecific 12 mm right parotid nodule. 4. Critical Value/emergent results were called by telephone at the time of interpretation on 05/10/2022 at 12:38 am to provider [facility] , who verbally acknowledged these results. By Electronically Signed   By: Ulyses Jarred M.D.   On: 05/10/2022 00:39       HISTORY OF PRESENT ILLNESS   Edward Maynard is  a 89 y.o. male with history of A-fib on Xarelto, hyperlipidemia, stroke in 05/2013 admitted for left-sided weakness and he fell at home. No tPA given due to outside window.     HOSPITAL COURSE Stroke:  right large MCA infarct due to right M2 occlusion status post IR with  [id number], embolic secondary to A-fib given on Xarelto  CT no acute abnormality Repeat CT 2/26 No new abnormality. Large right posterior division MCA territory infarct with mild petechial hemorrhage. Chronic cerebellar and thalamic small vessel infarcts. 15 mm right parotid mass CTA head and neck right M2 proximal occlusion, right ICA bulb atherosclerosis IR with right M2 distal occlusion. S/p 4 passes, TICI2c  MRI large right MCA infarct MRA persistent right MCA occlusion although motion degraded 2D Echo EF 60 to 65% LDL 56 HgbA1c 5.8 VTE prophylaxis: eliquis Xarelto (rivaroxaban) daily prior to admission, now on eliquis   Ongoing aggressive stroke risk factor management Therapy recommendations: CIR Disposition: awaiting insurance approval for CIR    History of stroke 05/2013 admitted for left-sided weakness and imbalance.  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  MRA/TTE/carotid Doppler negative.  Discharged on aspirin and Zocor. Outpatient 30-day CardioNet monitoring negative and loop recorder placed.  Found to have A-fib in 12/2014, started on Xarelto.  Mild left sided residual at baseline.   Chronic A-fib On Xarelto at home Currently [facility] on hold given large size of infarct Now on Eliquis    Hypertension Home meds: toprol 12.5 Stable on the high end BP goal < 180/105 Off cleviprex Put on amlodipine '10mg'$  and increase home toprol from 12.5 to 25 Long term BP goal normotensive   Hyperlipidemia Home meds: Zocor 40 LDL 56, goal < 70 Now on lipitor 20 (zocor 40 equivalent, as amlodipine can increase the risk of rhabdo when given with Zocor )  No high intensity statin given LDL at  goal Continue statin at discharge   Dysphagia  Pass swallow On dys 1  puree and nectar thick liquid IVF added back d/t increased creatinine Encourage po intake   Other Stroke Risk Factors Advanced age Obesity, Body mass index is 30.68 kg/m.    Other Active Problems CKD 3A, creatinine 1.30-1.43-1.13-1.03 - 1.26 - 1.04-.098 Will reduce IVF, encourage PO Hypokalemia: 3.7, replace and recheck in AM  Mild leukocytosis, resolved: WBC 11.6-8.7-7.4 - 7.0 - 7.1 Colace and PRN Dulcolax added to avoid constipation Insomnia - Ambien stopped. Trazodone PRN started, received 2/25 pm only.     DISCHARGE EXAM Blood pressure 112/65, pulse 83, temperature 97.9 F (36.6 C), temperature source Axillary, resp. rate 18, height 5' 11.5" (1.816 m), weight 101.2 kg, SpO2 96 %.  General - Well nourished, well developed, in no apparent distress. Cardiovascular - irregularly irregular heart rate and rhythm. (Baseline diagnosis)   Neuro - awake, alert, eyes open, orientated to name, place, time. No aphasia but moderate dysarthria, following all simple commands.  Able to name and repeat with dysarthric voice. Increased responsiveness and attention.  Right gaze preference but does cross midline with tracking. Left visual field cut, no blink to threat, neglect.   Left facial droop. Tongue protrusion midline.   Motor: LUE deltoid, bicep and tricep 3/5, finger grip 2/5.Improved control of movement. Left LE proximal 3/5, distal 4/5, lifts against gravity. RUE 5/5 and RLE 5/5.    Left sensory decreased, but improved from yesterday's exam.    Gait not tested.   Discharge Diet      Diet   DIET DYS 2 Room service appropriate? Yes; Fluid consistency: Thin   liquids  DISCHARGE PLAN Disposition:  Transfer to [place] for ongoing PT, OT and ST Eliquis  for secondary stroke prevention Recommend ongoing stroke  risk factor control by Primary Care Physician at time of discharge from inpatient  rehabilitation. Follow-up PCP Gaynelle Arabian, MD in 2 weeks following discharge from rehab. Follow-up in [facility] with Dr. Leonie Man in 8-12 weeks following discharge from rehab, office to schedule an appointment.   50 minutes were spent preparing discharge.  Beulah Gandy DNP, ACNPC-AG  [facility]  I have personally obtained history,examined this patient, reviewed notes, independently viewed imaging studies, participated in medical decision making and plan of care.ROS completed by me personally and pertinent positives fully documented  I have made any additions or clarifications directly to the above note. Agree with note above.    Antony Contras, MD Medical Director [facility] Pager: [phone] 05/18/2022 12:47 PM

## 2022-05-18 NOTE — Progress Notes (Signed)
Speech Language Pathology Treatment: Cognitive-Linquistic  Patient Details Name: CHIOKE JORDE MRN: [id number] DOB: 1934-01-02 Today's Date: 05/18/2022 Time: [id number] SLP Time Calculation (min) (ACUTE ONLY): 15 min  Assessment / Plan / Recommendation Clinical Impression  Visited pt and daughter this am, unfortunately tray had not yet arrived. Pt exhibited ongoing tolerance to thin liquids with single sips. Pt also verbalized precautions with question cue. SLP positioned pt in preparation for meal, demonstrated cues for daughter for improved attention to left visual field and tray, showed anchoring strategy with left arm. Pt able to maintain eye contact at midline for about 15 seconds, cues needed to return. Reiterated basic strategies for tolerance of dys 2 oral clearance. Pt to concentrate on bolus formation, lingual sweep, liquid wash as needed. Hopeful pt will tolerate though regression to dys 1 puree may be needed if not.   HPI        SLP Plan  Continue with current plan of care      Recommendations for follow up therapy are one component of a multi-disciplinary discharge planning process, led by the attending physician.  Recommendations may be updated based on patient status, additional functional criteria and insurance authorization.    Recommendations  Diet recommendations: Dysphagia 2 (fine chop);Thin liquid Liquids provided via: Straw Medication Administration: Whole meds with puree Supervision: Staff to assist with self feeding Compensations: Slow rate;Small sips/bites;Follow solids with liquid Postural Changes and/or Swallow Maneuvers: Seated upright 90 degrees                Follow Up Recommendations: Acute inpatient rehab (3hours/day) Assistance recommended at discharge: Frequent or constant Supervision/Assistance SLP Visit Diagnosis: Dysphagia, oropharyngeal phase (R13.12) Plan: Continue with current plan of care           Cearra Portnoy, Katherene Ponto  05/18/2022, 1:26 PM

## 2022-05-18 NOTE — Progress Notes (Signed)
PMR Admission Coordinator Pre-Admission Assessment   Patient: Edward Maynard is an 89 y.o., male MRN: [id number] DOB: 09-19-1933 Height: 5' 11.5" (181.6 cm) Weight: 101.2 kg   Insurance Information HMO: yes    PPO:      PCP:      IPA:      80/20:      OTHER:  PRIMARY: U[employer] Medicare      Policy#: [id number]      Subscriber: pt CM Name: Karleen Hampshire      Phone#: [phone]     Fax#: [phone] Pre-Cert#: [id number]  Josem Kaufmann for CIR from [facility] with Home and [facility] with updates due to fax listed above on 3/6      Employer:  Benefits:  Phone #: [phone]     Name:  Eff. Date: 21/1/24     Deduct: $0      Out of Pocket Max: $3600 (met $0)      Life Max: n/a CIR: $295/day for days 1-5      SNF: 20 full days Outpatient:      Co-Pay: $20/visit Home Health: 100%      Co-Pay:  DME: 80%     Co-Pay: 20% Providers:  SECONDARY:       Policy#:      Phone#:    Development worker, community:       Phone#:    The Therapist, art Information Summary" for patients in Inpatient Rehabilitation Facilities with attached "Privacy Act [place] Records" was provided and verbally reviewed with: Patient and Family   Emergency Contact Information Contact Information       Name Relation Home Work Mobile    [place] Daughter     [phone]    Garrod, Scalzitti     [phone]           Current Medical History  Patient Admitting Diagnosis: CVA    History of Present Illness: Edward Maynard is an 89 year old right-handed male with history of atrial fibrillation maintained on Xarelto, hyperlipidemia, 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  Presented 05/10/2022 with acute onset of left-sided weakness resulting in a fall. Admission chemistries unremarkable except glucose 158, creatinine 1.33, hemoglobin A1c 5.8. Cranial CT scan negative, CT cervical spine  negative. CT angiogram of head and neck showed occlusion of the proximal M2 segment inferior division of the right MCA. 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 Nonspecific 12 mm right parotid nodule. Patient did not receive tPA. IR consulted underwent revascularization of right M2 distal occlusion. Echocardiogram with ejection fraction of 60 to 65% no wall motion abnormalities grade 2 diastolic dysfunction. Neurology follow-up patient has been transition to Eliquis for CVA prophylaxis/atrial fibrillation. He is on a dysphagia #2 thin liquid diet. Therapy evaluations completed and pt was recommended for a comprehensive rehab program.    Complete NIHSS TOTAL: 7   [facility]'s [facility] record from [facility] has been reviewed by the rehabilitation admission coordinator and physician.   Past Medical History      Past Medical History:  Diagnosis Date   High cholesterol      05/2013; no defecits   Hyperlipemia     Left arm weakness     Left hand weakness     Personal history of colonic polyp - adenoma  10/23/2013   Stroke ([place]) 05/2013    MRI on 07/02/13 = Multifocal acute &  subacute infarction involving right frontal MCA/ACA & left parietal MCA/PCA watershed areas      Has the patient had major surgery during 100 days prior to admission? No   Family History   family history includes COPD in his brother; Hypertension in his father and mother; Stroke in his brother and father.   Current Medications   Current Facility-Administered Medications:    0.9 %  sodium chloride infusion, , Intravenous, Continuous, Lovey Newcomer C, NP, Last Rate: 50 mL/hr at 05/17/22 1500, New Bag at 05/17/22 1500   acetaminophen (TYLENOL) tablet 650 mg, 650 mg, Oral, Q4H PRN, 650 mg at 05/14/22 2340 **OR** acetaminophen (TYLENOL) 160 MG/5ML solution 650 mg, 650 mg, Per Tube, Q4H PRN **OR** acetaminophen (TYLENOL) suppository 650 mg, 650 mg, Rectal, Q4H PRN, Deveshwar, Sanjeev, MD    amLODipine (NORVASC) tablet 10 mg, 10 mg, Oral, Daily, Rosalin Hawking, MD, 10 mg at 05/18/22 0850   apixaban (ELIQUIS) tablet 5 mg, 5 mg, Oral, BID, Hammons, Kimberly B, RPH, 5 mg at 05/18/22 0850   atorvastatin (LIPITOR) tablet 20 mg, 20 mg, Oral, Daily, Pham, Minh Q, RPH-CPP, 20 mg at 05/18/22 0850   bisacodyl (DULCOLAX) suppository 10 mg, 10 mg, Rectal, Daily PRN, Lovey Newcomer C, NP   Chlorhexidine Gluconate Cloth 2 % PADS 6 each, 6 each, Topical, Daily, Rosalin Hawking, MD, 6 each at 05/18/22 0851   docusate sodium (COLACE) capsule 100 mg, 100 mg, Oral, Daily, Thressa Sheller, Erin C, NP, 100 mg at 05/17/22 0947   feeding supplement (ENSURE ENLIVE / ENSURE PLUS) liquid 237 mL, 237 mL, Oral, TID BM, Rosalin Hawking, MD, 237 mL at 05/18/22 0851   labetalol (NORMODYNE) injection 5-20 mg, 5-20 mg, Intravenous, Q2H PRN, Rosalin Hawking, MD   metoprolol succinate (TOPROL-XL) 24 hr tablet 25 mg, 25 mg, Oral, Daily, Rosalin Hawking, MD, 25 mg at 05/18/22 [id number]   Oral care mouth rinse, 15 mL, Mouth Rinse, PRN, Kerney Elbe, MD, 15 mL at 05/10/22 2300   senna-docusate (Senokot-S) tablet 1 tablet, 1 tablet, Oral, QHS PRN, Kerney Elbe, MD, 1 tablet at 05/13/22 0849   sodium chloride 0.9 % IV infusion, , Intravenous, Continuous, Palikh, Velna Ochs, MD, Stopped at 05/15/22 0847   traZODone (DESYREL) tablet 50 mg, 50 mg, Oral, QHS PRN, Dorene Grebe, MD, 50 mg at 05/14/22 2340   Patients Current Diet:  Diet Order                  DIET DYS 2 Room service appropriate? Yes; Fluid consistency: Thin  Diet effective now                         Precautions / Restrictions Precautions Precautions: Fall Precaution Comments: Left hemi; Left homonymous hemianopsia Restrictions Weight Bearing Restrictions: No    Has the patient had 2 or more falls or a fall with injury in the past year? Yes   Prior Activity Level Community (5-7x/wk): fully independent, driving, playing golf, provides assist for IADLs for his spouse  (dementia)   Prior Functional Level Self Care: Did the patient need help bathing, dressing, using the toilet or eating? Independent   Indoor Mobility: Did the patient need assistance with walking from room to room (with or without device)? Independent   Stairs: Did the patient need assistance with internal or external stairs (with or without device)? Independent   Functional Cognition: Did the patient need help planning regular tasks such as shopping or remembering to take medications? Independent  Patient Information Are you of Hispanic, Latino/a,or Spanish origin?: A. No, not of Hispanic, Latino/a, or Spanish origin What is your race?: A. White Do you need or want an interpreter to communicate with a doctor or health care staff?: 0. No   Patient's Response To:  Health Literacy and Transportation Is the patient able to respond to health literacy and transportation needs?: Yes Health Literacy - How often do you need to have someone help you when you read instructions, pamphlets, or other written material from your doctor or pharmacy?: Never In the past 12 months, has lack of transportation kept you from medical appointments or from getting medications?: No In the past 12 months, has lack of transportation kept you from meetings, work, or from getting things needed for daily living?: No   Home Assistive Devices / Equipment Home Equipment: Conservation officer, nature (2 wheels), Rollator (4 wheels), Shower seat   Prior Device Use: Indicate devices/aids used by the patient prior to current illness, exacerbation or injury? None of the above   Current Functional Level Cognition   Arousal/Alertness: Awake/alert Overall Cognitive Status: Impaired/Different from baseline Current Attention Level: Selective Orientation Level: Oriented to person, Oriented to place, Oriented to situation, Disoriented to time Following Commands: Follows one step commands with increased time, Follows one step commands  consistently, Follows multi-step commands inconsistently Safety/Judgement: Decreased awareness of safety, Decreased awareness of deficits General Comments: Pt needing cues to turn head and eyes to L to attend to objects or his own extremities on his L, often neglecting that side with poor awareness of L extremity protection. Able to detect which way he is leaning off midline, especially when provided visual input from a mirror, and correct it with min cues. Needs multi-modal cues and step-by-step directions to sequence stepping. Attention: Sustained, Selective Sustained Attention: Appears intact Selective Attention: Impaired Selective Attention Impairment: Functional basic Memory: Impaired Memory Impairment: Decreased short term memory, Storage deficit Decreased Short Term Memory: Verbal basic Awareness: Impaired Awareness Impairment: Intellectual impairment, Emergent impairment, Anticipatory impairment Problem Solving: Impaired Problem Solving Impairment: Functional basic Safety/Judgment: Impaired    Extremity Assessment (includes Sensation/Coordination)   Upper Extremity Assessment: LUE deficits/detail LUE Deficits / Details: Grossly weak. Unable to detect light touch thoughout entire LUE to half of collar bone. Shoulder flexion: 3-/5, elbow flexion/extension 3/5. decreased gross grasp. LUE Sensation: decreased light touch, decreased proprioception LUE Coordination: decreased gross motor, decreased fine motor  Lower Extremity Assessment: Defer to PT evaluation LLE Deficits / Details: gross weakness, </= 3-/5 throughout; withdraws to stimulation at plantar aspect of foot but unable to detect light touch throughout otherwise; gross incoordination LLE Sensation: decreased light touch, decreased proprioception LLE Coordination: decreased gross motor, decreased fine motor     ADLs   Overall ADL's : Needs assistance/impaired Eating/Feeding: Supervision/ safety Eating/Feeding Details  (indicate cue type and reason): cues to locate items on left Grooming: Wash/dry face, Brushing hair, Minimal assistance Grooming Details (indicate cue type and reason): performd from [facility] at sink with cues to attend to left Upper Body Bathing: Bed level, Maximal assistance Lower Body Bathing: Total assistance, Bed level Upper Body Dressing : Maximal assistance, Bed level Lower Body Dressing: Total assistance, Bed level Toilet Transfer: Maximal assistance, +2 for physical assistance, Stand-pivot Toilet Transfer Details (indicate cue type and reason): simulated to recliner Toileting- Clothing Manipulation and Hygiene: Total assistance, Bed level     Mobility   Overal bed mobility: Needs Assistance Bed Mobility: Supine to Sit Rolling: Mod assist Sidelying to sit: Mod assist, HOB  elevated Supine to sit: Max assist, HOB elevated Sit to supine: Mod assist, HOB elevated General bed mobility comments: MaxA to transition to sit L EOB with HOB elevated, cuing pt to push up on arm with assistance     Transfers   Overall transfer level: Needs assistance Equipment used: 2 person hand held assist, Rolling walker (2 wheels) Transfers: Sit to/from Stand, Bed to chair/wheelchair/BSC Sit to Stand: Mod assist, +2 safety/equipment, +2 physical assistance Bed to/from chair/wheelchair/BSC transfer type:: Step pivot Stand pivot transfers: +2 physical assistance, +2 safety/equipment, Total assist Step pivot transfers: Max assist, +2 physical assistance, +2 safety/equipment  Lateral/Scoot Transfers: Max assist, +2 physical assistance Transfer via Lift Equipment: Stedy General transfer comment: Pt transferred to stand 3x from EOB with bil HHA, 1x from recliner with bil HHA, and 1x from recliner to RW, needing cues for bil UE placement either with HHA or on chair to pull/push up to stand and L knee blocked, modAx2. MaxAx2 for stability, L knee block during L stance, L foot guidance to step with L, weight  shifting bil to step, and cues to pivot R foot as needed to stand step pivot to R from bed to chair with bil HHA. Mirror anterior to pt throughout all transfers and standing bouts to encourage midline alignment with noted good success.     Ambulation / Gait / Stairs / Wheelchair Mobility   Ambulation/Gait Ambulation/Gait assistance: Max assist, +2 physical assistance, +2 safety/equipment Gait Distance (Feet): 2 Feet Assistive device: 2 person hand held assist Gait Pattern/deviations: Step-to pattern, Decreased step length - right, Decreased step length - left, Decreased stance time - left, Decreased stance time - right, Decreased stride length, Decreased dorsiflexion - left, Decreased weight shift to right, Decreased weight shift to left, Knees buckling, Trunk flexed, Leaning posteriorly General Gait Details: MaxAx2 for stability, L knee block during L stance, L foot guidance to step with L, weight shifting bil to step, and cues to pivot R foot as needed to stand step pivot to R from bed to chair with bil HHA. Mirror anterior to pt throughout all transfers and standing bouts to encourage midline alignment with noted good success. Gait velocity: reduced Gait velocity interpretation: <1.31 ft/sec, indicative of household ambulator     Posture / Balance Dynamic Sitting Balance Sitting balance - Comments: pt able to maintain static sitting EOB with close supervision once he gained his balance, mirror anterior to him to encourage midline alignment with noted good success Balance Overall balance assessment: Needs assistance Sitting-balance support: Bilateral upper extremity supported, Feet supported, Single extremity supported Sitting balance-Leahy Scale: Poor Sitting balance - Comments: pt able to maintain static sitting EOB with close supervision once he gained his balance, mirror anterior to him to encourage midline alignment with noted good success Postural control: Left lateral lean, Posterior  lean Standing balance support: Single extremity supported, Bilateral upper extremity supported, Reliant on assistive device for balance, During functional activity Standing balance-Leahy Scale: Poor Standing balance comment: Reliant on UE support and modAx2 for standing with feet static and pt reaching off BOS with either UE to reach objects stuck to mirror anterior to him, L knee blocked throughout High Level Balance Comments: worked on perched sitting as well as sit>stand and maintaining standing in front of mirror, pt able to keep midline positioning when watching self     Special needs/care consideration N/a     Previous Home Environment (from acute therapy documentation) Living Arrangements: Spouse/significant other (Wife has dementia)  Lives With:  Spouse Available Help at Discharge: Family Type of Home: House Home Layout: Two level, Able to live on main level with bedroom/bathroom Home Access: Stairs to enter Entrance Stairs-Rails: None Entrance Stairs-Number of Steps: 2 Bathroom Shower/Tub: Chiropodist: Handicapped height Additional Comments: Pt reports that he did not use any DME at baseline   Discharge Living Setting Plans for Discharge Living Setting: Patient's home, Lives with (comment) (spouse) Type of Home at Discharge: House Discharge Home Layout: Able to live on main level with bedroom/bathroom Discharge Home Access: Stairs to enter Entrance Stairs-Rails: None Entrance Stairs-Number of Steps: 2 Discharge Bathroom Shower/Tub: Tub/shower unit Discharge Bathroom Toilet: Handicapped height Discharge Bathroom Accessibility: Yes How Accessible: Accessible via walker Does the patient have any problems obtaining your medications?: No   Social/Family/Support Systems Patient Roles: Spouse, Caregiver Anticipated Caregiver: daughter, [name], is primary contact Anticipated Caregiver's Contact Information: [phone] Ability/Limitations of Caregiver: min  assist with mobility, mod assist with ADLs Caregiver Availability: 24/7 Discharge Plan Discussed with Primary Caregiver: Yes Is Caregiver In Agreement with Plan?: Yes Does Caregiver/Family have Issues with Lodging/Transportation while Pt is in Rehab?: No   Goals Patient/Family Goal for Rehab: PT min assist, OT min to mod assist, SLP supervision Expected length of stay: 14-18 days Additional Information: discharge plan: Pt's home, son in law to stay during the day 8-5 (work from home), daughter to come after work hours till 8 pm, hired caregivers to cover nights/weekends Pt/Family Agrees to Admission and willing to participate: Yes Program Orientation Provided & Reviewed with Pt/Caregiver Including Roles  & Responsibilities: Yes Additional Information Needs: 3w TOC provided list of home care agencies  Barriers to Discharge: Insurance for SNF coverage   Decrease burden of Care through IP rehab admission: n/a   Possible need for SNF placement upon discharge: Not anticipated.  Discharge plan: home to pt's house.  Coverage 8A-8P between pt's daughter and son in law, 80P-8A and weekends with hired caregivers   Patient Condition: I have reviewed medical records from [facility], spoken with  [facility] team , and patient, spouse, son, and daughter. I met with patient at the bedside for inpatient rehabilitation assessment.  Patient will benefit from ongoing PT, OT, and SLP, can actively participate in 3 hours of therapy a day 5 days of the week, and can make measurable gains during the admission.  Patient will also benefit from the coordinated team approach during an Inpatient Acute Rehabilitation admission.  The patient will receive intensive therapy as well as Rehabilitation physician, nursing, social worker, and care management interventions.  Due to bladder management, bowel management, safety, skin/wound care, disease management, medication administration, pain management, and patient education the patient  requires 24 hour a day rehabilitation nursing.  The patient is currently max assist +2 with mobility and basic ADLs.  Discharge setting and therapy post discharge at home with home health is anticipated.  Patient has agreed to participate in the Acute Inpatient Rehabilitation Program and will admit today.   Preadmission Screen Completed By:  Michel Santee, PT, DPT 05/18/2022 11:29 AM ______________________________________________________________________   Discussed status with Dr. Curlene Dolphin on 05/18/22  at 11:38 AM  and received approval for admission today.   Admission Coordinator:  Michel Santee, PT, DPT time 11:38 AM Sudie Grumbling 05/18/22     Assessment/Plan: Diagnosis: Right MCA CVA Does the need for close, 24 hr/day Medical supervision in concert with the patient's rehab needs make it unreasonable for this patient to be served in a less intensive setting? Yes  Co-Morbidities requiring supervision/potential complications: Afib, HTN, HLD, dysphagia, CKD constipation, insomnia Due to bladder management, bowel management, safety, skin/wound care, disease management, medication administration, pain management, and patient education, does the patient require 24 hr/day rehab nursing? Yes Does the patient require coordinated care of a physician, rehab nurse, PT, OT, and SLP to address physical and functional deficits in the context of the above medical diagnosis(es)? Yes Addressing deficits in the following areas: balance, endurance, locomotion, strength, transferring, bowel/bladder control, bathing, dressing, feeding, grooming, toileting, cognition, speech, language, swallowing, and psychosocial support Can the patient actively participate in an intensive therapy program of at least 3 hrs of therapy 5 days a week? Yes The potential for patient to make measurable gains while on inpatient rehab is excellent Anticipated functional outcomes upon discharge from inpatient rehab: min assist PT, min assist and  mod assist OT, supervision SLP Estimated rehab length of stay to reach the above functional goals is: 14-18 Anticipated discharge destination: Home 10. Overall Rehab/Functional Prognosis: good     MD Signature: Jennye Boroughs

## 2022-05-18 NOTE — Progress Notes (Signed)
Physical Therapy Treatment Patient Details Name: Edward Maynard MRN: [id number] DOB: 07-23-1933 Today's Date: 05/18/2022   History of Present Illness Pt is an 89 y.o. male who presented 05/10/22 with L-sided weakness and a fall. Outside window for tPA. 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 PMH: A-fib on Xarelto, HLD, CVA in 05/2013    PT Comments    Focused session on midline alignment with all upright postures/mobility and on progressing pt away from using the stedy to eventually attempt to use an AD to ambulate. He continues to greatly benefit from visual feedback using a mirror to reduce his lateral leaning in sitting and standing. He also continues to display deficits in sensation, proprioception, and strength in his L extremities that result in him needing physical assistance to extend his L knee during stance phase and advance and safely place his L foot during step phase when performing stand step pivot transfer with bil HHA today, maxAx2. He was able to come to stand to a RW with modAx2 at the end of the session though. Will continue to follow acutely. Current recommendations remain appropriate.    Recommendations for follow up therapy are one component of a multi-disciplinary discharge planning process, led by the attending physician.  Recommendations may be updated based on patient status, additional functional criteria and insurance authorization.  Follow Up Recommendations  Acute inpatient rehab (3hours/day)     Assistance Recommended at Discharge Frequent or constant Supervision/Assistance  Patient can return home with the following Two people to help with walking and/or transfers;Two people to help with bathing/dressing/bathroom;Assistance with cooking/housework;Direct supervision/assist for medications management;Direct supervision/assist for financial management;Assist for transportation;Help with  stairs or ramp for entrance;Assistance with feeding   Equipment Recommendations  Other (comment) (defer to AIR)    Recommendations for Other Services       Precautions / Restrictions Precautions Precautions: Fall Precaution Comments: Left hemi; Left homonymous hemianopsia Restrictions Weight Bearing Restrictions: No     Mobility  Bed Mobility Overal bed mobility: Needs Assistance Bed Mobility: Supine to Sit     Supine to sit: Max assist, HOB elevated     General bed mobility comments: MaxA to transition to sit L EOB with HOB elevated, cuing pt to push up on arm with assistance    Transfers Overall transfer level: Needs assistance Equipment used: 2 person hand held assist, Rolling walker (2 wheels) Transfers: Sit to/from Stand, Bed to chair/wheelchair/BSC Sit to Stand: Mod assist, +2 safety/equipment, +2 physical assistance   Step pivot transfers: Max assist, +2 physical assistance, +2 safety/equipment       General transfer comment: Pt transferred to stand 3x from EOB with bil HHA, 1x from recliner with bil HHA, and 1x from recliner to RW, needing cues for bil UE placement either with HHA or on chair to pull/push up to stand and L knee blocked, modAx2. MaxAx2 for stability, L knee block during L stance, L foot guidance to step with L, weight shifting bil to step, and cues to pivot R foot as needed to stand step pivot to R from bed to chair with bil HHA. Mirror anterior to pt throughout all transfers and standing bouts to encourage midline alignment with noted good success.    Ambulation/Gait Ambulation/Gait assistance: Max assist, +2 physical assistance, +2 safety/equipment Gait Distance (Feet): 2 Feet Assistive device: 2 person hand held assist Gait Pattern/deviations: Step-to pattern, Decreased step length - right, Decreased step length -  left, Decreased stance time - left, Decreased stance time - right, Decreased stride length, Decreased dorsiflexion - left, Decreased  weight shift to right, Decreased weight shift to left, Knees buckling, Trunk flexed, Leaning posteriorly Gait velocity: reduced Gait velocity interpretation: <1.31 ft/sec, indicative of household ambulator   General Gait Details: MaxAx2 for stability, L knee block during L stance, L foot guidance to step with L, weight shifting bil to step, and cues to pivot R foot as needed to stand step pivot to R from bed to chair with bil HHA. Mirror anterior to pt throughout all transfers and standing bouts to encourage midline alignment with noted good success.   Stairs             Wheelchair Mobility    Modified Rankin (Stroke Patients Only) Modified Rankin (Stroke Patients Only) Pre-Morbid Rankin Score: No symptoms Modified Rankin: Severe disability     Balance Overall balance assessment: Needs assistance Sitting-balance support: Bilateral upper extremity supported, Feet supported, Single extremity supported Sitting balance-Leahy Scale: Poor Sitting balance - Comments: pt able to maintain static sitting EOB with close supervision once he gained his balance, mirror anterior to him to encourage midline alignment with noted good success Postural control: Left lateral lean, Posterior lean Standing balance support: Single extremity supported, Bilateral upper extremity supported, Reliant on assistive device for balance, During functional activity Standing balance-Leahy Scale: Poor Standing balance comment: Reliant on UE support and modAx2 for standing with feet static and pt reaching off BOS with either UE to reach objects stuck to mirror anterior to him, L knee blocked throughout                            Cognition Arousal/Alertness: Awake/alert Behavior During Therapy: WFL for tasks assessed/performed Overall Cognitive Status: Impaired/Different from baseline Area of Impairment: Attention, Awareness, Problem solving, Memory, Following commands, Safety/judgement                    Current Attention Level: Selective Memory: Decreased short-term memory Following Commands: Follows one step commands with increased time, Follows one step commands consistently, Follows multi-step commands inconsistently Safety/Judgement: Decreased awareness of safety, Decreased awareness of deficits Awareness: Emergent Problem Solving: Slow processing, Difficulty sequencing, Requires verbal cues, Requires tactile cues General Comments: Pt needing cues to turn head and eyes to L to attend to objects or his own extremities on his L, often neglecting that side with poor awareness of L extremity protection. Able to detect which way he is leaning off midline, especially when provided visual input from a mirror, and correct it with min cues. Needs multi-modal cues and step-by-step directions to sequence stepping.        Exercises Other Exercises Other Exercises: visual scanning to find and grab objects stuck to mirror in all directions, modAx2 for stability and assistance needed when using L arm due to poor grasp, x2 bouts    General Comments General comments (skin integrity, edema, etc.): encouraged daughter to sit on pt's L to facilitate pt scanning to the L more      Pertinent Vitals/Pain Pain Assessment Pain Assessment: Faces Faces Pain Scale: Hurts a little bit Pain Location: back (itchy) Pain Descriptors / Indicators: Other (Comment), Discomfort ("itchy") Pain Intervention(s): Monitored during session    Home Living                          Prior Function  PT Goals (current goals can now be found in the care plan section) Acute Rehab PT Goals Patient Stated Goal: to improve PT Goal Formulation: With patient/family Time For Goal Achievement: 05/24/22 Potential to Achieve Goals: Good Progress towards PT goals: Progressing toward goals    Frequency    Min 4X/week      PT Plan Current plan remains appropriate    Co-evaluation PT/OT/SLP  Co-Evaluation/Treatment: Yes Reason for Co-Treatment: For patient/therapist safety;To address functional/ADL transfers PT goals addressed during session: Mobility/safety with mobility;Balance OT goals addressed during session: ADL's and self-care;Other (comment);Strengthening/ROM (left inattention)      AM-PAC PT "6 Clicks" Mobility   Outcome Measure  Help needed turning from your back to your side while in a flat bed without using bedrails?: A Lot Help needed moving from lying on your back to sitting on the side of a flat bed without using bedrails?: A Lot Help needed moving to and from a bed to a chair (including a wheelchair)?: Total Help needed standing up from a chair using your arms (e.g., wheelchair or bedside chair)?: Total Help needed to walk in hospital room?: Total Help needed climbing 3-5 steps with a railing? : Total 6 Click Score: 8    End of Session Equipment Utilized During Treatment: Gait belt Activity Tolerance: Patient tolerated treatment well Patient left: with call bell/phone within reach;with family/visitor present;in chair;with chair alarm set   PT Visit Diagnosis: Unsteadiness on feet (R26.81);Other abnormalities of gait and mobility (R26.89);Muscle weakness (generalized) (M62.81);Difficulty in walking, not elsewhere classified (R26.2);Other symptoms and signs involving the nervous system (R29.898);Hemiplegia and hemiparesis Hemiplegia - Right/Left: Left Hemiplegia - dominant/non-dominant: Non-dominant Hemiplegia - caused by: Cerebral infarction     Time: [id number] PT Time Calculation (min) (ACUTE ONLY): 45 min  Charges:  $Therapeutic Activity: 8-22 mins $Neuromuscular Re-education: 8-22 mins                     Moishe Spice, PT, DPT Acute Rehabilitation Services  Office: [phone]    Orvan Falconer 05/18/2022, 11:04 AM

## 2022-05-18 NOTE — Plan of Care (Signed)
  Problem: Education: Goal: Knowledge of disease or condition will improve Outcome: Progressing Goal: Knowledge of secondary prevention will improve (MUST DOCUMENT ALL) Outcome: Progressing Goal: Knowledge of patient specific risk factors will improve [place] N/A or DELETE if not current risk factor) Outcome: Progressing   Problem: Ischemic Stroke/TIA Tissue Perfusion: Goal: Complications of ischemic stroke/TIA will be minimized Outcome: Progressing   Problem: Self-Care: Goal: Ability to participate in self-care as condition permits will improve Outcome: Progressing Goal: Verbalization of feelings and concerns over difficulty with self-care will improve Outcome: Progressing Goal: Ability to communicate needs accurately will improve Outcome: Progressing

## 2022-05-18 NOTE — TOC Transition Note (Signed)
Transition of Care [facility]) - CM/SW Discharge Note   Patient Details  Name: Edward Maynard MRN: [id number] Date of Birth: February 10, 1934  Transition of Care [facility]) CM/SW Contact:  Pollie Friar, RN Phone Number: 05/18/2022, 11:23 AM   Clinical Narrative:    Pt is discharging to CIR today. CM signing off.    Final next level of care: IP Rehab Facility Barriers to Discharge: No Barriers Identified   Patient Goals and CMS Choice CMS Medicare.gov Compare Post Acute Care list provided to:: Patient Choice offered to / list presented to : Patient, Adult Children  Discharge Placement                         Discharge Plan and Services Additional resources added to the After Visit Summary for       Post Acute Care Choice: [place]                               Social Determinants of Health (SDOH) Interventions SDOH Screenings   Tobacco Use: Medium Risk (05/11/2022)     Readmission Risk Interventions     No data to display

## 2022-05-18 NOTE — H&P (Signed)
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  Per chart review patient lives with spouse.  [name] in the room to assist with history.  Two-level home bed and bath main level.  Independent prior to admission.  Wife with dementia and limited assistance.  Family with good support and plans to provide 24/7 care.  Presented 05/10/2022 with acute onset of left-sided weakness resulting in a fall.  Admission chemistries unremarkable except glucose 158, creatinine 1.33, hemoglobin A1c 5.8.  Cranial CT scan negative, CT cervical spine negative.  CT angiogram of head and neck showed occlusion of the proximal M2 segment inferior division of the right MCA.  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  Nonspecific 15 mm right parotid nodule.  Patient did not receive tPA.  IR consulted underwent revascularization of right M2 distal occlusion.  Echocardiogram with ejection fraction of 60 to 65% no wall motion abnormalities grade 2 diastolic dysfunction.  Neurology follow-up patient has been transition to Eliquis for CVA prophylaxis/atrial fibrillation.  He is on a dysphagia #2 thin liquid diet.  Patient denies any pain.  Family reports his dysarthria has improved.  Therapy evaluations completed due to patient decreased functional mobility left-sided weakness was admitted for a comprehensive rehab program.   Review of Systems  Constitutional:  Negative for chills and fever.  HENT:  Negative for hearing loss.   Eyes:  Negative for blurred vision and double vision.  Respiratory:   Negative for cough, shortness of breath and wheezing.   Cardiovascular:  Positive for palpitations. Negative for chest pain and leg swelling.  Gastrointestinal:  Positive for constipation. Negative for abdominal pain, heartburn, nausea and vomiting.  Genitourinary:  Positive for urgency. Negative for dysuria, flank pain and hematuria.  Musculoskeletal:  Positive for joint pain and myalgias.  Skin:  Negative for rash.  Neurological:  Positive for sensory change and weakness.  All other systems reviewed and are negative.       Past Medical History:  Diagnosis Date   High cholesterol      05/2013; no defecits   Hyperlipemia     Left arm weakness     Left hand weakness     Personal history of colonic polyp - adenoma  10/23/2013   Stroke ([place]) 05/2013    MRI on 07/02/13 = Multifocal acute & subacute infarction involving right frontal MCA/ACA & left parietal MCA/PCA watershed areas         Past Surgical History:  Procedure Laterality Date   IR CT HEAD LTD   05/10/2022   IR PERCUTANEOUS ART THROMBECTOMY/INFUSION INTRACRANIAL INC DIAG ANGIO   05/10/2022   LOOP RECORDER IMPLANT N/A 10/31/2013    Procedure: LOOP RECORDER IMPLANT;  Surgeon: Coralyn Mark, MD;  Location: [facility];  Service: Cardiovascular;  Laterality: N/A;   RADIOLOGY WITH ANESTHESIA N/A 05/10/2022    Procedure: IR WITH ANESTHESIA;  Surgeon: Luanne Bras, MD;  Location: [facility];  Service: Radiology;  Laterality: N/A;   SHOULDER SURGERY Bilateral 2003, 1995         Family History  Problem Relation Age of  Onset   Hypertension Mother     Hypertension Father     Stroke Father     COPD Brother     Stroke Brother     Colon cancer Neg Hx     Heart attack Neg Hx      Social History:  reports that he quit smoking about 52 years ago. His smoking use included cigarettes. He has never used smokeless tobacco. He reports that he does not drink alcohol and does not use drugs. Allergies: No Known Allergies       Medications Prior  to Admission  Medication Sig Dispense Refill   metoprolol succinate (TOPROL XL) 25 MG 24 hr tablet Take 0.5 tablets (12.5 mg total) by mouth daily. 45 tablet 3   rivaroxaban (XARELTO) 20 MG TABS tablet TAKE 1 TABLET BY MOUTH DAILY  WITH SUPPER (Patient taking differently: Take 20 mg by mouth daily with supper.) 100 tablet 3   simvastatin (ZOCOR) 40 MG tablet Take 1 tablet by mouth  daily at 6 PM. 90 tablet 3          Home: Home Living Family/patient expects to be discharged to:: Private residence Living Arrangements: Spouse/significant other (Wife has dementia) Available Help at Discharge: Family Type of Home: House Home Access: Stairs to enter Technical brewer of Steps: 2 Entrance Stairs-Rails: None Home Layout: Two level, Able to live on main level with bedroom/bathroom Bathroom Shower/Tub: Chiropodist: Handicapped height Home Equipment: Conservation officer, nature (2 wheels), Rollator (4 wheels), Shower seat Additional Comments: Pt reports that he did not use any DME at baseline  Lives With: Spouse   Functional History: Prior Function Prior Level of Function : Independent/Modified Independent, Driving Mobility Comments: No AD ADLs Comments: Played golf   Functional Status:  Mobility: Bed Mobility Overal bed mobility: Needs Assistance Bed Mobility: Rolling, Sidelying to Sit, Sit to Supine Rolling: Mod assist Sidelying to sit: Mod assist, HOB elevated Supine to sit: Mod assist, HOB elevated, +2 for safety/equipment Sit to supine: Mod assist, HOB elevated General bed mobility comments: PT helping position L UE to ensure safety with rolling to L and pushing up on L arm to sit up L EOB. ModA to lift legs onto bed to return to supine and direct trunk to midline. Transfers Overall transfer level: Needs assistance Equipment used: Ambulation equipment used Transfers: Sit to/from Stand Sit to Stand: Min assist, Mod assist Bed to/from chair/wheelchair/BSC transfer  type:: Lateral/scoot transfer Stand pivot transfers: +2 physical assistance, +2 safety/equipment, Total assist  Lateral/Scoot Transfers: Max assist, +2 physical assistance Transfer via Lift Equipment: Stedy General transfer comment: Pt needing physical assistance for trunk stability and lifting to place L foot on stedy platform for set-up. Needs cues and guidance for safe L hand placement on stedy bar. ModA to power up to stand from EOB to stedy 1x, minA from stedy flaps >5x. Ambulation/Gait General Gait Details: unable   ADL: ADL Overall ADL's : Needs assistance/impaired Eating/Feeding: Supervision/ safety Eating/Feeding Details (indicate cue type and reason): cues to locate items on left Grooming: Wash/dry face, Brushing hair, Minimal assistance Grooming Details (indicate cue type and reason): performd from [facility] at sink with cues to attend to left Upper Body Bathing: Bed level, Maximal assistance Lower Body Bathing: Total assistance, Bed level Upper Body Dressing : Maximal assistance, Bed level Lower Body Dressing: Total assistance, Bed level Toilet Transfer: Maximal assistance, +2 for physical assistance, Stand-pivot Toilet Transfer Details (indicate cue type and reason): simulated to recliner Toileting- Clothing Manipulation and  Hygiene: Total assistance, Bed level   Cognition: Cognition Overall Cognitive Status: Impaired/Different from baseline Arousal/Alertness: Awake/alert Orientation Level: Oriented X4 Attention: Sustained, Selective Sustained Attention: Appears intact Selective Attention: Impaired Selective Attention Impairment: Functional basic Memory: Impaired Memory Impairment: Decreased short term memory, Storage deficit Decreased Short Term Memory: Verbal basic Awareness: Impaired Awareness Impairment: Intellectual impairment, Emergent impairment, Anticipatory impairment Problem Solving: Impaired Problem Solving Impairment: Functional basic Safety/Judgment:  Impaired Cognition Arousal/Alertness: Awake/alert Behavior During Therapy: WFL for tasks assessed/performed Overall Cognitive Status: Impaired/Different from baseline Area of Impairment: Attention, Awareness, Problem solving, Memory, Following commands, Safety/judgement Current Attention Level: Sustained Memory: Decreased short-term memory Following Commands: Follows one step commands with increased time, Follows one step commands consistently Safety/Judgement: Decreased awareness of safety, Decreased awareness of deficits Awareness: Emergent Problem Solving: Slow processing, Difficulty sequencing, Requires verbal cues General Comments: Pt needing cues to turn head and eyes to L to attend to objects or his own extremities on his L, often neglecting that side with poor awareness of L extremity protection. Able to detect which way he is leaning off midline, especially when provided visual input from a mirror, and correct it with min cues.   Physical Exam: Blood pressure (!) 137/98, pulse 80, temperature 98.4 F (36.9 C), temperature source Axillary, resp. rate 18, height 5' 11.5" (1.816 m), weight 101.2 kg, SpO2 96 %.     General:  No apparent distress HEENT: Head is normocephalic, atraumatic, PERRLA, sclera anicteric, oral mucosa pink and moist,  Neck: Supple without JVD or lymphadenopathy Heart: Irregularly irregular rhythm,, normal rate, No murmurs rubs or gallops Chest: CTA bilaterally without wheezes, rales, or rhonchi; no distress Abdomen: Soft, non-tender, non-distended, bowel sounds positive. Extremities: No clubbing, cyanosis, or edema. Pulses are 2+ Psych: Pt's affect is appropriate. Pt is cooperative Skin: Clean and intact without signs of breakdown Neuro:  Alert and oriented x4, Able to name 3 items, able to repeat, follows simple commands, R gaze preference, mild dysarthria, L visual field cut, Left facial weakness, Tongue midline, Follows commands Decreased sensation LUE and  LLE Strength 5 out of 5 in right upper and lower extremity Left upper extremity shoulder abduction and 4-5, elbow flexion extension 4 out of 5, finger flexion 2 out of 5 Left lower extremity proximal 4- out of 5 distal 4 out of 5 Finger-nose intact on the right   Musculoskeletal: No joint swelling or tenderness No abnormal tone noted       Lab Results Last 48 Hours        Results for orders placed or performed during the hospital encounter of 05/10/22 (from the past 48 hour(s))  Basic metabolic panel     Status: Abnormal    Collection Time: 05/16/22  6:25 AM  Result Value Ref Range    Sodium 138 135 - 145 mmol/L    Potassium 3.7 3.5 - 5.1 mmol/L    Chloride 107 98 - 111 mmol/L    CO2 21 (L) 22 - 32 mmol/L    Glucose, Bld 118 (H) 70 - 99 mg/dL      Comment: Glucose reference range applies only to samples taken after fasting for at least 8 hours.    BUN 27 (H) 8 - 23 mg/dL    Creatinine, Ser 1.04 0.61 - 1.24 mg/dL    Calcium 8.7 (L) 8.9 - 10.3 mg/dL    GFR, Estimated >60 >60 mL/min      Comment: (NOTE) Calculated using the CKD-EPI Creatinine Equation (2021)      Anion  gap 10 5 - 15      Comment: Performed at [facility], [place] [address] [place] [address]  CBC     Status: None    Collection Time: 05/16/22  6:25 AM  Result Value Ref Range    WBC 7.1 4.0 - 10.5 K/uL    RBC 4.60 4.22 - 5.81 MIL/uL    Hemoglobin 15.3 13.0 - 17.0 g/dL    HCT 44.2 39.0 - 52.0 %    MCV 96.1 80.0 - 100.0 fL    MCH 33.3 26.0 - 34.0 pg    MCHC 34.6 30.0 - 36.0 g/dL    RDW 13.5 11.5 - 15.5 %    Platelets 171 150 - 400 K/uL    nRBC 0.0 0.0 - 0.2 %      Comment: Performed at [facility], [place] [address], [place], [place] [id number]  Basic metabolic panel     Status: Abnormal    Collection Time: 05/18/22  4:54 AM  Result Value Ref Range    Sodium 137 135 - 145 mmol/L    Potassium 3.8 3.5 - 5.1 mmol/L    Chloride 104 98 - 111 mmol/L    CO2 22 22 - 32 mmol/L    Glucose, Bld 127  (H) 70 - 99 mg/dL      Comment: Glucose reference range applies only to samples taken after fasting for at least 8 hours.    BUN 18 8 - 23 mg/dL    Creatinine, Ser 0.98 0.61 - 1.24 mg/dL    Calcium 9.1 8.9 - 10.3 mg/dL    GFR, Estimated >60 >60 mL/min      Comment: (NOTE) Calculated using the CKD-EPI Creatinine Equation (2021)      Anion gap 11 5 - 15      Comment: Performed at [facility] [address], [place] [address]  CBC     Status: None    Collection Time: 05/18/22  4:54 AM  Result Value Ref Range    WBC 8.1 4.0 - 10.5 K/uL    RBC 4.66 4.22 - 5.81 MIL/uL    Hemoglobin 15.7 13.0 - 17.0 g/dL    HCT 44.0 39.0 - 52.0 %    MCV 94.4 80.0 - 100.0 fL    MCH 33.7 26.0 - 34.0 pg    MCHC 35.7 30.0 - 36.0 g/dL    RDW 13.2 11.5 - 15.5 %    Platelets 198 150 - 400 K/uL    nRBC 0.0 0.0 - 0.2 %      Comment: Performed at [facility], [place] [address] [place], [place] [address]       Imaging Results (Last 48 hours)  CT HEAD WO CONTRAST (5MM)   Result Date: 05/16/2022 CLINICAL DATA:  Stroke follow-up EXAM: CT HEAD WITHOUT CONTRAST TECHNIQUE: Contiguous axial images were obtained from the base of the skull through the vertex without intravenous contrast. RADIATION DOSE REDUCTION: This exam was performed according to the departmental dose-optimization program which includes automated exposure control, adjustment of the mA and/or kV according to patient size and/or use of iterative reconstruction technique. COMPARISON:  05/10/2022 FINDINGS: Brain: Known acute infarct in the posterior division of the right MCA territory with petechial hemorrhage. No hematoma or malignant swelling. Chronic bilateral cerebellar and right thalamic infarcts. No hemorrhage, hydrocephalus, or masslike finding. Vascular: No new abnormality Skull: Negative Sinuses/Orbits: Negative Other: Partially covered right parotid mass measuring 15 mm. IMPRESSION: 1.  No new abnormality. Large right posterior  division MCA territory infarct with mild petechial hemorrhage. 2. Chronic cerebellar and thalamic small vessel infarcts. 3. 15 mm right parotid mass. Electronically Signed   By: Jorje Guild M.D.   On: 05/16/2022 07:03           Blood pressure (!) 137/98, pulse 80, temperature 98.4 F (36.9 C), temperature source Axillary, resp. rate 18, height 5' 11.5" (1.816 m), weight 101.2 kg, SpO2 96 %.   Medical Problem List and Plan: 1. Functional deficits secondary to right large MCA infarction due to right M2 occlusion status post IR/revascularization as well as history of CVA 2015 with left-sided residual weakness status post loop recorder             -patient may  shower             -ELOS/Goals: 14-18 days, PT min A, OT min to mod A, SLP supervision             -Admit to CIR             -Continue work on visual scanning as he appears to have a visual field cut on the left 2.  Antithrombotics: -DVT/anticoagulation:  Pharmaceutical: Eliquis             -antiplatelet therapy: N/A 3. Pain Management: Tylenol as needed 4. Mood/Behavior/Sleep: Provide emotional support             -antipsychotic agents: N/A 5. Neuropsych/cognition: This patient is capable of making decisions on his own behalf. 6. Skin/Wound Care: Routine skin checks 7. Fluids/Electrolytes/Nutrition: Routine in and outs with follow-up chemistries 8.  Dysphagia.  Dysphagia #2 thin liquids.  Follow-up speech therapy 9.  Hypertension.  Norvasc 10 mg daily, Toprol-XL 25 mg daily.  Monitor with increased mobility.  Overall well-controlled.  Goal BP less than 180/105, long-term goal normotensive 10.  Atrial fibrillation.  Cardiac rate controlled.  Continue Toprol-xl 25 mg daily 11.  Hyperlipidemia.  Lipitor 20 mg daily 12.  Parotid mass, follow-up outpatient 13.  CKD 3a.  Recheck labs tomorrow morning     Cathlyn Parsons, PA-C 05/18/2022   I have personally performed a face to face diagnostic evaluation of this patient and  formulated the key components of the plan.  Additionally, I have personally reviewed laboratory data, imaging studies, as well as relevant notes and concur with the physician assistant's documentation above.   The patient's status has not changed from the original H&P.  Any changes in documentation from the acute care chart have been noted above.   Jennye Boroughs, MD, [facility]

## 2022-05-18 NOTE — Progress Notes (Signed)
Inpatient Rehab Admissions Coordinator:    I have insurance approval and a bed available for pt to admit to CIR today. Dr. Leonie Man in agreement.  Will let pt/family and TOC team know.   Shann Medal, PT, DPT Admissions Coordinator [phone] 05/18/22  11:22 AM

## 2022-05-18 NOTE — PMR Pre-admission (Signed)
PMR Admission Coordinator Pre-Admission Assessment  Patient: Edward Maynard is an 89 y.o., male MRN: [id number] DOB: [birth year] Height: 5' 11.5" (181.6 cm) Weight: 101.2 kg  Insurance Information HMO: yes    PPO:      PCP:      IPA:      80/20:      OTHER:  PRIMARY: [employer] Medicare      Policy#: [id number]      Subscriber: pt CM Name: Edward Maynard      Phone#: [phone]     Fax#: [phone] Pre-Cert#: [id number]  Edward Maynard for CIR from [facility] with Home and [facility] with updates due to fax listed above on 3/6      Employer:  Benefits:  Phone #: [phone]     Name:  Eff. Date: 21/1/24     Deduct: $0      Out of Pocket Max: $3600 (met $0)      Life Max: n/a CIR: $295/day for days 1-5      SNF: 20 full days Outpatient:      Co-Pay: $20/visit Home Health: 100%      Co-Pay:  DME: 80%     Co-Pay: 20% Providers:  SECONDARY:       Policy#:      Phone#:   Development worker, community:       Phone#:   The Therapist, art Information Summary" for patients in Inpatient Rehabilitation Facilities with attached "Privacy Act [place] Records" was provided and verbally reviewed with: Patient and Family  Emergency Contact Information Contact Information     Name Relation Home Work Mobile   [place] Daughter   [phone]   [place], Ante   [phone]       Current Medical History  Patient Admitting Diagnosis: CVA   History of Present Illness: Edward Maynard is an 89 year old right-handed male with history of atrial fibrillation maintained on Xarelto, hyperlipidemia, 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  Presented 05/10/2022 with acute onset of left-sided weakness resulting in a fall. Admission chemistries unremarkable except glucose 158, creatinine 1.33, hemoglobin A1c 5.8. Cranial CT scan negative, CT cervical spine negative. CT angiogram of  head and neck showed occlusion of the proximal M2 segment inferior division of the right MCA. 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 Nonspecific 12 mm right parotid nodule. Patient did not receive tPA. IR consulted underwent revascularization of right M2 distal occlusion. Echocardiogram with ejection fraction of 60 to 65% no wall motion abnormalities grade 2 diastolic dysfunction. Neurology follow-up patient has been transition to Eliquis for CVA prophylaxis/atrial fibrillation. He is on a dysphagia #2 thin liquid diet. Therapy evaluations completed and pt was recommended for a comprehensive rehab program.   Complete NIHSS TOTAL: 7  [facility]'s [facility] record from [facility] has been reviewed by the rehabilitation admission coordinator and physician.  Past Medical History  Past Medical History:  Diagnosis Date   High cholesterol    05/2013; no defecits   Hyperlipemia    Left arm weakness    Left hand weakness    Personal history of colonic polyp - adenoma  10/23/2013   Stroke ([place]) 05/2013   MRI on 07/02/13 = Multifocal acute & subacute infarction involving right frontal MCA/ACA & left parietal MCA/PCA watershed areas    Has the patient had major surgery during 100 days prior to admission? No  Family History  family history includes COPD in his brother; Hypertension in his father and mother; Stroke in his brother and father.  Current Medications  Current Facility-Administered Medications:    0.9 %  sodium chloride infusion, , Intravenous, Continuous, Lovey Newcomer C, NP, Last Rate: 50 mL/hr at 05/17/22 1500, New Bag at 05/17/22 1500   acetaminophen (TYLENOL) tablet 650 mg, 650 mg, Oral, Q4H PRN, 650 mg at 05/14/22 2340 **OR** acetaminophen (TYLENOL) 160 MG/5ML solution 650 mg, 650 mg, Per Tube, Q4H PRN **OR** acetaminophen (TYLENOL) suppository 650 mg, 650 mg, Rectal, Q4H PRN, Deveshwar, Sanjeev, MD   amLODipine (NORVASC) tablet 10 mg, 10 mg, Oral,  Daily, Rosalin Hawking, MD, 10 mg at 05/18/22 0850   apixaban (ELIQUIS) tablet 5 mg, 5 mg, Oral, BID, Hammons, Kimberly B, RPH, 5 mg at 05/18/22 0850   atorvastatin (LIPITOR) tablet 20 mg, 20 mg, Oral, Daily, Pham, Minh Q, RPH-CPP, 20 mg at 05/18/22 0850   bisacodyl (DULCOLAX) suppository 10 mg, 10 mg, Rectal, Daily PRN, Lovey Newcomer C, NP   Chlorhexidine Gluconate Cloth 2 % PADS 6 each, 6 each, Topical, Daily, Rosalin Hawking, MD, 6 each at 05/18/22 0851   docusate sodium (COLACE) capsule 100 mg, 100 mg, Oral, Daily, Thressa Sheller, Erin C, NP, 100 mg at 05/17/22 0947   feeding supplement (ENSURE ENLIVE / ENSURE PLUS) liquid 237 mL, 237 mL, Oral, TID BM, Rosalin Hawking, MD, 237 mL at 05/18/22 0851   labetalol (NORMODYNE) injection 5-20 mg, 5-20 mg, Intravenous, Q2H PRN, Rosalin Hawking, MD   metoprolol succinate (TOPROL-XL) 24 hr tablet 25 mg, 25 mg, Oral, Daily, Rosalin Hawking, MD, 25 mg at 05/18/22 [id number]   Oral care mouth rinse, 15 mL, Mouth Rinse, PRN, Kerney Elbe, MD, 15 mL at 05/10/22 2300   senna-docusate (Senokot-S) tablet 1 tablet, 1 tablet, Oral, QHS PRN, Kerney Elbe, MD, 1 tablet at 05/13/22 0849   sodium chloride 0.9 % IV infusion, , Intravenous, Continuous, Palikh, Velna Ochs, MD, Stopped at 05/15/22 0847   traZODone (DESYREL) tablet 50 mg, 50 mg, Oral, QHS PRN, Dorene Grebe, MD, 50 mg at 05/14/22 2340  Patients Current Diet:  Diet Order             DIET DYS 2 Room service appropriate? Yes; Fluid consistency: Thin  Diet effective now                   Precautions / Restrictions Precautions Precautions: Fall Precaution Comments: Left hemi; Left homonymous hemianopsia Restrictions Weight Bearing Restrictions: No   Has the patient had 2 or more falls or a fall with injury in the past year? Yes  Prior Activity Level Community (5-7x/wk): fully independent, driving, playing golf, provides assist for IADLs for his spouse (dementia)  Prior Functional Level Self Care: Did the patient need  help bathing, dressing, using the toilet or eating? Independent  Indoor Mobility: Did the patient need assistance with walking from room to room (with or without device)? Independent  Stairs: Did the patient need assistance with internal or external stairs (with or without device)? Independent  Functional Cognition: Did the patient need help planning regular tasks such as shopping or remembering to take medications? Independent  Patient Information Are you of Hispanic, Latino/a,or Spanish origin?: A. No, not of Hispanic, Latino/a, or Spanish origin What is your race?: A. White Do you need or want an interpreter to communicate with a doctor or health care staff?: 0. No  Patient's Response To:  Health Literacy and Transportation Is the patient able to  respond to health literacy and transportation needs?: Yes Health Literacy - How often do you need to have someone help you when you read instructions, pamphlets, or other written material from your doctor or pharmacy?: Never In the past 12 months, has lack of transportation kept you from medical appointments or from getting medications?: No In the past 12 months, has lack of transportation kept you from meetings, work, or from getting things needed for daily living?: No  Home Assistive Devices / Equipment Home Equipment: Conservation officer, nature (2 wheels), Rollator (4 wheels), Shower seat  Prior Device Use: Indicate devices/aids used by the patient prior to current illness, exacerbation or injury? None of the above  Current Functional Level Cognition  Arousal/Alertness: Awake/alert Overall Cognitive Status: Impaired/Different from baseline Current Attention Level: Selective Orientation Level: Oriented to person, Oriented to place, Oriented to situation, Disoriented to time Following Commands: Follows one step commands with increased time, Follows one step commands consistently, Follows multi-step commands inconsistently Safety/Judgement: Decreased  awareness of safety, Decreased awareness of deficits General Comments: Pt needing cues to turn head and eyes to L to attend to objects or his own extremities on his L, often neglecting that side with poor awareness of L extremity protection. Able to detect which way he is leaning off midline, especially when provided visual input from a mirror, and correct it with min cues. Needs multi-modal cues and step-by-step directions to sequence stepping. Attention: Sustained, Selective Sustained Attention: Appears intact Selective Attention: Impaired Selective Attention Impairment: Functional basic Memory: Impaired Memory Impairment: Decreased short term memory, Storage deficit Decreased Short Term Memory: Verbal basic Awareness: Impaired Awareness Impairment: Intellectual impairment, Emergent impairment, Anticipatory impairment Problem Solving: Impaired Problem Solving Impairment: Functional basic Safety/Judgment: Impaired    Extremity Assessment (includes Sensation/Coordination)  Upper Extremity Assessment: LUE deficits/detail LUE Deficits / Details: Grossly weak. Unable to detect light touch thoughout entire LUE to half of collar bone. Shoulder flexion: 3-/5, elbow flexion/extension 3/5. decreased gross grasp. LUE Sensation: decreased light touch, decreased proprioception LUE Coordination: decreased gross motor, decreased fine motor  Lower Extremity Assessment: Defer to PT evaluation LLE Deficits / Details: gross weakness, </= 3-/5 throughout; withdraws to stimulation at plantar aspect of foot but unable to detect light touch throughout otherwise; gross incoordination LLE Sensation: decreased light touch, decreased proprioception LLE Coordination: decreased gross motor, decreased fine motor    ADLs  Overall ADL's : Needs assistance/impaired Eating/Feeding: Supervision/ safety Eating/Feeding Details (indicate cue type and reason): cues to locate items on left Grooming: Wash/dry face, Brushing  hair, Minimal assistance Grooming Details (indicate cue type and reason): performd from [place] at sink with cues to attend to left Upper Body Bathing: Bed level, Maximal assistance Lower Body Bathing: Total assistance, Bed level Upper Body Dressing : Maximal assistance, Bed level Lower Body Dressing: Total assistance, Bed level Toilet Transfer: Maximal assistance, +2 for physical assistance, Stand-pivot Toilet Transfer Details (indicate cue type and reason): simulated to recliner Toileting- Clothing Manipulation and Hygiene: Total assistance, Bed level    Mobility  Overal bed mobility: Needs Assistance Bed Mobility: Supine to Sit Rolling: Mod assist Sidelying to sit: Mod assist, HOB elevated Supine to sit: Max assist, HOB elevated Sit to supine: Mod assist, HOB elevated General bed mobility comments: MaxA to transition to sit L EOB with HOB elevated, cuing pt to push up on arm with assistance    Transfers  Overall transfer level: Needs assistance Equipment used: 2 person hand held assist, Rolling walker (2 wheels) Transfers: Sit to/from Stand, Bed to chair/wheelchair/BSC  Sit to Stand: Mod assist, +2 safety/equipment, +2 physical assistance Bed to/from chair/wheelchair/BSC transfer type:: Step pivot Stand pivot transfers: +2 physical assistance, +2 safety/equipment, Total assist Step pivot transfers: Max assist, +2 physical assistance, +2 safety/equipment  Lateral/Scoot Transfers: Max assist, +2 physical assistance Transfer via Lift Equipment: Stedy General transfer comment: Pt transferred to stand 3x from EOB with bil HHA, 1x from recliner with bil HHA, and 1x from recliner to RW, needing cues for bil UE placement either with HHA or on chair to pull/push up to stand and L knee blocked, modAx2. MaxAx2 for stability, L knee block during L stance, L foot guidance to step with L, weight shifting bil to step, and cues to pivot R foot as needed to stand step pivot to R from bed to chair with  bil HHA. Mirror anterior to pt throughout all transfers and standing bouts to encourage midline alignment with noted good success.    Ambulation / Gait / Stairs / Wheelchair Mobility  Ambulation/Gait Ambulation/Gait assistance: Max assist, +2 physical assistance, +2 safety/equipment Gait Distance (Feet): 2 Feet Assistive device: 2 person hand held assist Gait Pattern/deviations: Step-to pattern, Decreased step length - right, Decreased step length - left, Decreased stance time - left, Decreased stance time - right, Decreased stride length, Decreased dorsiflexion - left, Decreased weight shift to right, Decreased weight shift to left, Knees buckling, Trunk flexed, Leaning posteriorly General Gait Details: MaxAx2 for stability, L knee block during L stance, L foot guidance to step with L, weight shifting bil to step, and cues to pivot R foot as needed to stand step pivot to R from bed to chair with bil HHA. Mirror anterior to pt throughout all transfers and standing bouts to encourage midline alignment with noted good success. Gait velocity: reduced Gait velocity interpretation: <1.31 ft/sec, indicative of household ambulator    Posture / Balance Dynamic Sitting Balance Sitting balance - Comments: pt able to maintain static sitting EOB with close supervision once he gained his balance, mirror anterior to him to encourage midline alignment with noted good success Balance Overall balance assessment: Needs assistance Sitting-balance support: Bilateral upper extremity supported, Feet supported, Single extremity supported Sitting balance-Leahy Scale: Poor Sitting balance - Comments: pt able to maintain static sitting EOB with close supervision once he gained his balance, mirror anterior to him to encourage midline alignment with noted good success Postural control: Left lateral lean, Posterior lean Standing balance support: Single extremity supported, Bilateral upper extremity supported, Reliant on  assistive device for balance, During functional activity Standing balance-Leahy Scale: Poor Standing balance comment: Reliant on UE support and modAx2 for standing with feet static and pt reaching off BOS with either UE to reach objects stuck to mirror anterior to him, L knee blocked throughout High Level Balance Comments: worked on perched sitting as well as sit>stand and maintaining standing in front of mirror, pt able to keep midline positioning when watching self    Special needs/care consideration N/a    Previous Home Environment (from acute therapy documentation) Living Arrangements: Spouse/significant other (Wife has dementia)  Lives With: Spouse Available Help at Discharge: Family Type of Home: House Home Layout: Two level, Able to live on main level with bedroom/bathroom Home Access: Stairs to enter Entrance Stairs-Rails: None Entrance Stairs-Number of Steps: 2 Bathroom Shower/Tub: Chiropodist: Handicapped height Additional Comments: Pt reports that he did not use any DME at baseline  Discharge Living Setting Plans for Discharge Living Setting: Patient's home, Lives with (comment) (spouse) Type of  Home at Discharge: House Discharge Home Layout: Able to live on main level with bedroom/bathroom Discharge Home Access: Stairs to enter Entrance Stairs-Rails: None Entrance Stairs-Number of Steps: 2 Discharge Bathroom Shower/Tub: Tub/shower unit Discharge Bathroom Toilet: Handicapped height Discharge Bathroom Accessibility: Yes How Accessible: Accessible via walker Does the patient have any problems obtaining your medications?: No  Social/Family/Support Systems Patient Roles: Spouse, Caregiver Anticipated Caregiver: daughter, [name], is primary contact Anticipated Caregiver's Contact Information: [phone] Ability/Limitations of Caregiver: min assist with mobility, mod assist with ADLs Caregiver Availability: 24/7 Discharge Plan Discussed with Primary  Caregiver: Yes Is Caregiver In Agreement with Plan?: Yes Does Caregiver/Family have Issues with Lodging/Transportation while Pt is in Rehab?: No  Goals Patient/Family Goal for Rehab: PT min assist, OT min to mod assist, SLP supervision Expected length of stay: 14-18 days Additional Information: discharge plan: Pt's home, son in law to stay during the day 8-5 (work from home), daughter to come after work hours till 8 pm, hired caregivers to cover nights/weekends Pt/Family Agrees to Admission and willing to participate: Yes Program Orientation Provided & Reviewed with Pt/Caregiver Including Roles  & Responsibilities: Yes Additional Information Needs: 3w TOC provided list of home care agencies  Barriers to Discharge: Insurance for SNF coverage  Decrease burden of Care through IP rehab admission: n/a  Possible need for SNF placement upon discharge: Not anticipated.  Discharge plan: home to pt's house.  Coverage 8A-8P between pt's daughter and son in law, 29P-8A and weekends with hired caregivers  Patient Condition: I have reviewed medical records from [facility], spoken with  [facility] team , and patient, spouse, son, and daughter. I met with patient at the bedside for inpatient rehabilitation assessment.  Patient will benefit from ongoing PT, OT, and SLP, can actively participate in 3 hours of therapy a day 5 days of the week, and can make measurable gains during the admission.  Patient will also benefit from the coordinated team approach during an Inpatient Acute Rehabilitation admission.  The patient will receive intensive therapy as well as Rehabilitation physician, nursing, social worker, and care management interventions.  Due to bladder management, bowel management, safety, skin/wound care, disease management, medication administration, pain management, and patient education the patient requires 24 hour a day rehabilitation nursing.  The patient is currently max assist +2 with mobility and basic ADLs.   Discharge setting and therapy post discharge at home with home health is anticipated.  Patient has agreed to participate in the Acute Inpatient Rehabilitation Program and will admit today.  Preadmission Screen Completed By:  Michel Santee, PT, DPT 05/18/2022 11:29 AM ______________________________________________________________________   Discussed status with Dr. Curlene Dolphin on 05/18/22  at 11:38 AM  and received approval for admission today.  Admission Coordinator:  Michel Santee, PT, DPT time 11:38 AM Sudie Grumbling 05/18/22    Assessment/Plan: Diagnosis: Right MCA CVA Does the need for close, 24 hr/day Medical supervision in concert with the patient's rehab needs make it unreasonable for this patient to be served in a less intensive setting? Yes Co-Morbidities requiring supervision/potential complications: Afib, HTN, HLD, dysphagia, CKD constipation, insomnia Due to bladder management, bowel management, safety, skin/wound care, disease management, medication administration, pain management, and patient education, does the patient require 24 hr/day rehab nursing? Yes Does the patient require coordinated care of a physician, rehab nurse, PT, OT, and SLP to address physical and functional deficits in the context of the above medical diagnosis(es)? Yes Addressing deficits in the following areas: balance, endurance, locomotion, strength, transferring, bowel/bladder control, bathing,  dressing, feeding, grooming, toileting, cognition, speech, language, swallowing, and psychosocial support Can the patient actively participate in an intensive therapy program of at least 3 hrs of therapy 5 days a week? Yes The potential for patient to make measurable gains while on inpatient rehab is excellent Anticipated functional outcomes upon discharge from inpatient rehab: min assist PT, min assist and mod assist OT, supervision SLP Estimated rehab length of stay to reach the above functional goals is:  14-18 Anticipated discharge destination: Home 10. Overall Rehab/Functional Prognosis: good   MD Signature: Jennye Boroughs

## 2022-05-19 ENCOUNTER — Other Ambulatory Visit (HOSPITAL_COMMUNITY): Payer: Self-pay

## 2022-05-19 LAB — CBC WITH DIFFERENTIAL/PLATELET
Abs Immature Granulocytes: 0.09 10*3/uL — ABNORMAL HIGH (ref 0.00–0.07)
Basophils Absolute: 0.1 10*3/uL (ref 0.0–0.1)
Basophils Relative: 1 %
Eosinophils Absolute: 0.2 10*3/uL (ref 0.0–0.5)
Eosinophils Relative: 2 %
HCT: 46 % (ref 39.0–52.0)
Hemoglobin: 16.3 g/dL (ref 13.0–17.0)
Immature Granulocytes: 1 %
Lymphocytes Relative: 16 %
Lymphs Abs: 1.3 10*3/uL (ref 0.7–4.0)
MCH: 34.1 pg — ABNORMAL HIGH (ref 26.0–34.0)
MCHC: 35.4 g/dL (ref 30.0–36.0)
MCV: 96.2 fL (ref 80.0–100.0)
Monocytes Absolute: 0.7 10*3/uL (ref 0.1–1.0)
Monocytes Relative: 8 %
Neutro Abs: 5.8 10*3/uL (ref 1.7–7.7)
Neutrophils Relative %: 72 %
Platelets: 210 10*3/uL (ref 150–400)
RBC: 4.78 MIL/uL (ref 4.22–5.81)
RDW: 13.2 % (ref 11.5–15.5)
WBC: 8.1 10*3/uL (ref 4.0–10.5)
nRBC: 0 % (ref 0.0–0.2)

## 2022-05-19 LAB — COMPREHENSIVE METABOLIC PANEL
ALT: 48 U/L — ABNORMAL HIGH (ref 0–44)
AST: 33 U/L (ref 15–41)
Albumin: 3.6 g/dL (ref 3.5–5.0)
Alkaline Phosphatase: 99 U/L (ref 38–126)
Anion gap: 10 (ref 5–15)
BUN: 23 mg/dL (ref 8–23)
CO2: 25 mmol/L (ref 22–32)
Calcium: 9.2 mg/dL (ref 8.9–10.3)
Chloride: 99 mmol/L (ref 98–111)
Creatinine, Ser: 1.14 mg/dL (ref 0.61–1.24)
GFR, Estimated: >60 mL/min (ref >60)
Glucose, Bld: 182 mg/dL — ABNORMAL HIGH (ref 70–99)
Potassium: 3.7 mmol/L (ref 3.5–5.1)
Sodium: 134 mmol/L — ABNORMAL LOW (ref 135–145)
Total Bilirubin: 1 mg/dL (ref 0.3–1.2)
Total Protein: 6.8 g/dL (ref 6.5–8.1)

## 2022-05-19 NOTE — Discharge Instructions (Addendum)
Inpatient Rehab Discharge Instructions  Edward Maynard Discharge date and time: No discharge date for patient encounter.   Activities/Precautions/ Functional Status: Activity: activity as tolerated Diet: Mechanical soft Wound Care: Routine skin checks Functional status:  ___ No restrictions     ___ Walk up steps independently ___ 24/7 supervision/assistance   ___ Walk up steps with assistance ___ Intermittent supervision/assistance  ___ Bathe/dress independently ___ Walk with walker     _x__ Bathe/dress with assistance ___ Walk Independently    ___ Shower independently ___ Walk with assistance    ___ Shower with assistance ___ No alcohol     ___ Return to work/school ________  Special Instructions:  No driving smoking or alcoholSTROKE/TIA DISCHARGE INSTRUCTIONS SMOKING Cigarette smoking nearly doubles your risk of having a stroke & is the single most alterable risk factor  If you smoke or have smoked in the last 12 months, you are advised to quit smoking for your health. Most of the excess cardiovascular risk related to smoking disappears within a year of stopping. Ask you doctor about anti-smoking medications [place] Quit Line: 1-800-QUIT NOW Free Smoking Cessation Classes [phone]  CHOLESTEROL Know your levels; limit fat & cholesterol in your diet  Lipid Panel     Component Value Date/Time   CHOL 134 05/10/2022 0545   TRIG 190 (H) 05/10/2022 0545   HDL 40 (L) 05/10/2022 0545   CHOLHDL 3.4 05/10/2022 0545   VLDL 38 05/10/2022 0545   LDLCALC 56 05/10/2022 0545     Many patients benefit from treatment even if their cholesterol is at goal. Goal: Total Cholesterol (CHOL) less than 160 Goal:  Triglycerides (TRIG) less than 150 Goal:  HDL greater than 40 Goal:  LDL (LDLCALC) less than 100   BLOOD PRESSURE American Stroke Association blood pressure target is less that 120/80 mm/Hg  Your discharge blood pressure is:  BP: 132/82 Monitor your blood pressure Limit your salt and  alcohol intake Many individuals will require more than one medication for high blood pressure  DIABETES (A1c is a blood sugar average for last 3 months) Goal HGBA1c is under 7% (HBGA1c is blood sugar average for last 3 months)  Diabetes: No known diagnosis of diabetes    Lab Results  Component Value Date   HGBA1C 5.8 (H) 05/10/2022    Your HGBA1c can be lowered with medications, healthy diet, and exercise. Check your blood sugar as directed by your physician Call your physician if you experience unexplained or low blood sugars.  PHYSICAL ACTIVITY/REHABILITATION Goal is 30 minutes at least 4 days per week  Activity: Increase activity slowly, Therapies: Physical Therapy: Home Health Return to work:  Activity decreases your risk of heart attack and stroke and makes your heart stronger.  It helps control your weight and blood pressure; helps you relax and can improve your mood. Participate in a regular exercise program. Talk with your doctor about the best form of exercise for you (dancing, walking, swimming, cycling).  DIET/WEIGHT Goal is to maintain a healthy weight  Your discharge diet is:  Diet Order             DIET DYS 2 Room service appropriate? Yes; Fluid consistency: Thin  Diet effective now                   liquids Your height is:  Height: 5' 11.5" (181.6 cm) Your current weight is: Weight: 94 kg Your Body Mass Index (BMI) is:  BMI (Calculated): 28.5 Following the type of diet  specifically designed for you will help prevent another stroke. Your goal weight range is:   Your goal Body Mass Index (BMI) is 19-24. Healthy food habits can help reduce 3 risk factors for stroke:  High cholesterol, hypertension, and excess weight.  RESOURCES Stroke/Support Group:  Call [phone]   STROKE EDUCATION PROVIDED/REVIEWED AND GIVEN TO PATIENT Stroke warning signs and symptoms How to activate emergency medical system (call 911). Medications prescribed at discharge. Need for  follow-up after discharge. Personal risk factors for stroke. Pneumonia vaccine given: No Flu vaccine given: No My questions have been answered, the writing is legible, and I understand these instructions.  I will adhere to these goals & educational materials that have been provided to me after my discharge from the hospital.     My questions have been answered and I understand these instructions. I will adhere to these goals and the provided educational materials after my discharge from the hospital.  Patient/Caregiver Signature _______________________________ Date __________  Clinician Signature _______________________________________ Date __________  Please bring this form and your medication list with you to all your follow-up doctor's appointments.     Information on my medicine - ELIQUIS (apixaban)  This medication education was reviewed with me or my healthcare representative as part of my discharge preparation.    Why was Eliquis prescribed for you? Eliquis was prescribed for you to reduce the risk of a blood clot forming that can cause a stroke if you have a medical condition called atrial fibrillation (a type of irregular heartbeat).  What do You need to know about Eliquis ? Take your Eliquis TWICE DAILY - one tablet in the morning and one tablet in the evening with or without food. If you have difficulty swallowing the tablet whole please discuss with your pharmacist how to take the medication safely.  Take Eliquis exactly as prescribed by your doctor and DO NOT stop taking Eliquis without talking to the doctor who prescribed the medication.  Stopping may increase your risk of developing a stroke.  Refill your prescription before you run out.  After discharge, you should have regular check-up appointments with your healthcare provider that is prescribing your Eliquis.  In the future your dose may need to be changed if your kidney function or weight changes by a  significant amount or as you get older.  What do you do if you miss a dose? If you miss a dose, take it as soon as you remember on the same day and resume taking twice daily.  Do not take more than one dose of ELIQUIS at the same time to make up a missed dose.  Important Safety Information A possible side effect of Eliquis is bleeding. You should call your healthcare provider right away if you experience any of the following: Bleeding from an injury or your nose that does not stop. Unusual colored urine (red or dark brown) or unusual colored stools (red or black). Unusual bruising for unknown reasons. A serious fall or if you hit your head (even if there is no bleeding).  Some medicines may interact with Eliquis and might increase your risk of bleeding or clotting while on Eliquis. To help avoid this, consult your healthcare provider or pharmacist prior to using any new prescription or non-prescription medications, including herbals, vitamins, non-steroidal anti-inflammatory drugs (NSAIDs) and supplements.  This website has more information on Eliquis (apixaban): [email or web address].[email or web address]

## 2022-05-19 NOTE — Evaluation (Signed)
Physical Therapy Assessment and Plan  Patient Details  Name: Edward Maynard MRN: [id number] Date of Birth: December 28, 1933  PT Diagnosis: Abnormality of gait, Ataxia, Ataxic gait, Cognitive deficits, Coordination disorder, Difficulty walking, Hemiplegia non-dominant, Impaired cognition, Impaired sensation, and Muscle weakness Rehab Potential: Good ELOS: 3 weeks   Today's Date: 05/19/2022 PT Individual Time: 1300-1415 PT Individual Time Calculation (min): 75 min    Hospital Problem: Principal Problem:   Right middle cerebral artery stroke [facility])   Past Medical History:  Past Medical History:  Diagnosis Date   High cholesterol    05/2013; no defecits   Hyperlipemia    Left arm weakness    Left hand weakness    Personal history of colonic polyp - adenoma  10/23/2013   Stroke ([place]) 05/2013   MRI on 07/02/13 = Multifocal acute & subacute infarction involving right frontal MCA/ACA & left parietal MCA/PCA watershed areas   Past Surgical History:  Past Surgical History:  Procedure Laterality Date   IR CT HEAD LTD  05/10/2022   IR PERCUTANEOUS ART THROMBECTOMY/INFUSION INTRACRANIAL INC DIAG ANGIO  05/10/2022   LOOP RECORDER IMPLANT N/A 10/31/2013   Procedure: LOOP RECORDER IMPLANT;  Surgeon: Coralyn Mark, MD;  Location: [place] CATH LAB;  Service: Cardiovascular;  Laterality: N/A;   RADIOLOGY WITH ANESTHESIA N/A 05/10/2022   Procedure: IR WITH ANESTHESIA;  Surgeon: Luanne Bras, MD;  Location: [place];  Service: Radiology;  Laterality: N/A;   SHOULDER SURGERY Bilateral 2003, 1995    Assessment & Plan Clinical Impression: Patient is an 89 year old right-handed male with history of atrial fibrillation maintained on Xarelto, hyperlipidemia, 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  Per chart review patient lives with spouse.  [name] in the room to  assist with history.  Two-level home bed and bath main level.  Independent prior to admission.  Wife with dementia and limited assistance.  Family with good support and plans to provide 24/7 care.  Presented 05/10/2022 with acute onset of left-sided weakness resulting in a fall.  Admission chemistries unremarkable except glucose 158, creatinine 1.33, hemoglobin A1c 5.8.  Cranial CT scan negative, CT cervical spine negative.  CT angiogram of head and neck showed occlusion of the proximal M2 segment inferior division of the right MCA.  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  Nonspecific 15 mm right parotid nodule.  Patient did not receive tPA.  IR consulted underwent revascularization of right M2 distal occlusion.  Echocardiogram with ejection fraction of 60 to 65% no wall motion abnormalities grade 2 diastolic dysfunction.  Neurology follow-up patient has been transition to Eliquis for CVA prophylaxis/atrial fibrillation.  He is on a dysphagia #2 thin liquid diet.  Patient denies any pain.  Family reports his dysarthria has improved.  Therapy evaluations completed due to patient decreased functional mobility left-sided weakness was admitted for a comprehensive rehab program.   Patient currently requires max with mobility secondary to muscle weakness, decreased cardiorespiratoy endurance, impaired timing and sequencing, motor apraxia, ataxia, decreased coordination, and decreased motor planning, field cut and L inattention, decreased midline orientation, decreased attention to left, left side neglect, and decreased motor planning, decreased initiation, decreased attention, decreased awareness, decreased problem solving, decreased safety awareness, and decreased memory, impulsivity with functional mobility, and decreased sitting balance, decreased standing balance, hemiplegia, and decreased balance strategies.  Prior to hospitalization, patient was independent  with  mobility  and lived with Spouse in a House home.  Home access is 2 steps going through the front door and 1 step going through the garageStairs to enter.  Patient will benefit from skilled PT intervention to maximize safe functional mobility, minimize fall risk, and decrease caregiver burden for planned discharge home with 24 hour assist.  Anticipate patient will benefit from follow up OP at discharge.  PT - End of Session Activity Tolerance: Tolerates 30+ min activity with multiple rests Endurance Deficit: Yes Endurance Deficit Description: Global deconditioning with poor activity tolerance PT Assessment Rehab Potential (ACUTE/IP ONLY): Good PT Barriers to Discharge: Home environment access/layout;Insurance for SNF coverage PT Patient demonstrates impairments in the following area(s): Balance;Perception;Behavior;Safety;Sensory;Endurance;Motor PT Transfers Functional Problem(s): Bed Mobility;Bed to Chair;Car PT Locomotion Functional Problem(s): Ambulation;Wheelchair Mobility;Stairs PT Plan PT Intensity: Minimum of 1-2 x/day ,45 to 90 minutes PT Frequency: 5 out of 7 days PT Duration Estimated Length of Stay: 3 weeks PT Treatment/Interventions: Ambulation/gait training;Community reintegration;DME/adaptive equipment instruction;Neuromuscular re-education;Psychosocial support;Stair training;UE/LE Strength taining/ROM;Wheelchair propulsion/positioning;Balance/vestibular training;Discharge planning;Functional electrical stimulation;Pain management;UE/LE Coordination activities;Therapeutic Activities;Cognitive remediation/compensation;Disease management/prevention;Functional mobility training;Patient/family education;Splinting/orthotics;Therapeutic Exercise;Visual/perceptual remediation/compensation PT Transfers Anticipated Outcome(s): CGA/MinA with LRAD PT Locomotion Anticipated Outcome(s): MinA with LRAD for household distances PT Recommendation Recommendations for Other Services: Neuropsych consult Follow  Up Recommendations: Outpatient PT (Pending progress) Patient destination: Home Equipment Recommended: To be determined Equipment Details: TBD   PT Evaluation Precautions/Restrictions Precautions Precautions: Fall Precaution Comments: Left hemi; Left homonymous hemianopsia Restrictions Weight Bearing Restrictions: No Pain Interference Pain Interference Pain Effect on Sleep: 2. Occasionally Pain Interference with Therapy Activities: 1. Rarely or not at all Pain Interference with Day-to-Day Activities: 1. Rarely or not at all Home Living/Prior [place] Available Help at Discharge: Family;Available 24 hours/day (son-in-law to asssit during day time; may hire additional assistance for night) Type of Home: House Home Access: Stairs to enter [employer] of Steps: 2 steps going through the front door and 1 step going through the garage Entrance Stairs-Rails: None Home Layout: Two level;Able to live on main level with bedroom/bathroom;Full bath on main level Alternate Level Stairs-Number of Steps: 13 steps Bathroom Shower/Tub: Chiropodist: Handicapped height Bathroom Accessibility: Yes Additional Comments: Pt reports that he did not use any DME at baseline  Lives With: Spouse Prior Function Level of Independence: Independent with basic ADLs;Independent with transfers;Independent with gait  Able to Take Stairs?: Yes Driving: Yes Vocation: Retired Leisure: Hobbies-yes (Comment) Vision/Perception  Vision - History Ability to See in Adequate Light: 0 Adequate Vision - Assessment Eye Alignment: Within Functional Limits Ocular Range of Motion: Restricted on the left;Impaired-to be further tested in functional context Alignment/Gaze Preference: Head turned;Gaze right Tracking/Visual Pursuits: Requires cues, head turns, or add eye shifts to track;Impaired - to be further tested in functional context;Left eye does not track laterally Saccades:  Impaired - to be further tested in functional context;Additional head turns occurred during testing;Decreased speed of saccadic movement Convergence: Impaired (comment) (no double vision reported, Pt presenting wiht decreased attention impacting ability to follow directions) Additional Comments: Required multimodal cues for attending to the L side of his body as well as objects on his L side Perception Perception: Impaired Inattention/Neglect: Does not attend to left visual field;Does not attend to left side of body Spatial Orientation: impaired Praxis Praxis: Impaired Praxis Impairment Details: Motor planning;Initiation;Ideomotor;Perseveration Praxis-Other Comments: apraxic  Cognition Overall Cognitive Status: History of cognitive impairments - at baseline (age releated changes per DTR report) Arousal/Alertness: Awake/alert Orientation Level: Oriented X4 Year: 2024 Month:  March Day of Week: Correct Attention: Focused;Sustained Focused Attention: Appears intact Sustained Attention: Appears intact Selective Attention: Impaired Selective Attention Impairment: Functional basic Memory: Impaired Memory Impairment: Decreased short term memory;Storage deficit Decreased Short Term Memory: Verbal basic;Functional basic Awareness: Impaired Awareness Impairment: Emergent impairment Problem Solving: Impaired Problem Solving Impairment: Verbal complex;Functional basic Executive Function:  (impaired, continue to assess in fucntional environment) Safety/Judgment: Impaired Sensation Sensation Light Touch: Impaired Detail Light Touch Impaired Details: Impaired LUE;Impaired LLE Hot/Cold: Not tested Proprioception: Impaired Detail Proprioception Impaired Details: Impaired LLE;Impaired LUE Stereognosis: Not tested Coordination Gross Motor Movements are Fluid and Coordinated: No Fine Motor Movements are Fluid and Coordinated: No Coordination and Movement Description: Ataxic with poor  coordination, motor planning, initiation Finger Nose Finger Test: dysmetira noted BUEs, ataxia, and poor coordination Motor  Motor Motor: Hemiplegia;Motor apraxia;Ataxia Motor - Skilled Clinical Observations: L hemi (L UE weaker than L LE) with significant motor apraxia/ataxia of L LE   Trunk/Postural Assessment  Cervical Assessment Cervical Assessment: Exceptions to [facility] (Forward head) Thoracic Assessment Thoracic Assessment: Exceptions to [facility] (Rounded shoulders) Lumbar Assessment Lumbar Assessment: Exceptions to [facility] (Posterior pelvic tilt) Postural Control Postural Control: Deficits on evaluation Righting Reactions: Delayed and inadequate  Balance Balance Balance Assessed: Yes Static Sitting Balance Static Sitting - Balance Support: Feet supported;Right upper extremity supported Static Sitting - Level of Assistance: 5: Stand by assistance Dynamic Sitting Balance Dynamic Sitting - Balance Support: During functional activity;Feet unsupported Dynamic Sitting - Level of Assistance: 4: Min Insurance risk surveyor Standing - Balance Support: Bilateral upper extremity supported Static Standing - Level of Assistance: 3: Mod assist Dynamic Standing Balance Dynamic Standing - Balance Support: During functional activity;Right upper extremity supported Dynamic Standing - Level of Assistance: 1: +2 Total assist Extremity Assessment      RLE Assessment RLE Assessment: Within Functional Limits General Strength Comments: Grossly 4/5 LLE Assessment LLE Assessment: Within Functional Limits General Strength Comments: Grossly 4/5, however apraxic/ataxic  Care Tool Care Tool Bed Mobility Roll left and right activity   Roll left and right assist level: Maximal Assistance - Patient 25 - 49%    Sit to lying activity   Sit to lying assist level: Maximal Assistance - Patient 25 - 49%    Lying to sitting on side of bed activity   Lying to sitting on side of bed assist level:  the ability to move from lying on the back to sitting on the side of the bed with no back support.: Maximal Assistance - Patient 25 - 49%     Care Tool Transfers Sit to stand transfer   Sit to stand assist level: Maximal Assistance - Patient 25 - 49%    Chair/bed transfer   Chair/bed transfer assist level: Maximal Assistance - Patient 25 - 49%     Toilet transfer   Assist Level: Dependent - Patient 0% (stedy)    Car transfer   Car transfer assist level: Maximal Assistance - Patient 25 - 49%      Care Tool Locomotion Ambulation   Assist level: 2 helpers Assistive device: Other (comment) (Hemi-rail) Max distance: 10'  Walk 10 feet activity   Assist level: 2 helpers Assistive device: Other (comment) (R hemi-rail)   Walk 50 feet with 2 turns activity Walk 50 feet with 2 turns activity did not occur: Safety/medical concerns (Patient unable to ambulate >10' at this time secondary to increased fatigue with poor endurance/activity tolerance)      Walk 150 feet activity Walk 150 feet activity did not  occur: Safety/medical concerns      Walk 10 feet on uneven surfaces activity Walk 10 feet on uneven surfaces activity did not occur: Safety/medical concerns      Stairs Stair activity did not occur: Safety/medical concerns (Unable to perform stair mobility at this time secondary to poor endurance/activity tolerance)        Walk up/down 1 step activity Walk up/down 1 step or curb (drop down) activity did not occur: Safety/medical concerns      Walk up/down 4 steps activity Walk up/down 4 steps activity did not occur: Safety/medical concerns      Walk up/down 12 steps activity Walk up/down 12 steps activity did not occur: Safety/medical concerns      Pick up small objects from floor Pick up small object from the floor (from standing position) activity did not occur: Safety/medical concerns (Unable to be performed safely without the intervention of AD/AE due to ataxia/apraxia with  poor safety awareness)      Wheelchair Is the patient using a wheelchair?: Yes Type of Wheelchair: Manual   Wheelchair assist level: Contact Guard/Touching assist Max wheelchair distance: 150'  Wheel 50 feet with 2 turns activity   Assist Level: Contact Guard/Touching assist  Wheel 150 feet activity   Assist Level: Contact Guard/Touching assist    Refer to Care Plan for Long Term Goals  SHORT TERM GOAL WEEK 1 PT Short Term Goal 1 (Week 1): Patient will perform bed mobility with MinA PT Short Term Goal 2 (Week 1): Patient will perform bed/chair transfer with LRAD and ModA PT Short Term Goal 3 (Week 1): Patient will ambulate 22' with LRAD and MaxA x1  Recommendations for other services: Neuropsych  Skilled Therapeutic Intervention Mobility Bed Mobility Bed Mobility: Rolling Right;Rolling Left;Supine to Sit;Sit to Supine Rolling Right: Moderate Assistance - Patient 50-74%;Maximal Assistance - Patient 25-49% Rolling Left: Minimal Assistance - Patient > 75% Supine to Sit: Maximal Assistance - Patient - Patient 25-49% Sit to Supine: Moderate Assistance - Patient 50-74% Transfers Transfers: Sit to Stand;Stand to Sit Sit to Stand: Moderate Assistance - Patient 50-74% Stand to Sit: Moderate Assistance - Patient 50-74% Stand Pivot Transfers: 2 Helpers Squat Pivot Transfers: Maximal Assistance - Patient 25-49% Transfer (Assistive device): None Transfer via Lift Equipment: Probation officer Ambulation: Yes Gait Assistance: 2 Helpers Social research officer, government (Feet): 10 Feet Assistive device: Other (Comment) (Hemi-rail) Gait Assistance Details: Tactile cues for weight shifting;Tactile cues for sequencing;Tactile cues for initiation;Tactile cues for posture;Tactile cues for placement;Verbal cues for sequencing;Verbal cues for technique;Manual facilitation for placement;Manual facilitation for weight bearing Gait Gait: Yes Gait Pattern: Step-to pattern;Decreased step length -  right;Decreased stance time - left;Decreased weight shift to right;Narrow base of support;Ataxic;Left flexed knee in stance;Trunk flexed Stairs / Additional Locomotion Stairs: No Wheelchair Mobility Wheelchair Mobility: Yes Wheelchair Assistance: Development worker, international aid: Right upper extremity;Right lower extremity Wheelchair Parts Management: Needs assistance Distance: 150'  Skilled Intervention- Patient greeted supine in bed with family present and agreeable to PT treatment session. Patient performed bed mobility with MaxA and multimodal cues for proper sequencing secondary to motor apraxia/ataxia. While sitting EOB, therapist donned socks and tennis shoes with good dynamic sitting balance noted- At times required MinA with VC for improved midline posturing. Patient stood from EOB with RW and MaxA- Multimodal cues and increased time for improved sequencing and proper hand placement. Patient performed static standing with ModA progressing to MaxA with increased fatigue secondary to heavy L lateral lean with L knee flexion- Multimodal cues for  improved L TKE while in standing with R lateral weight shift, however patient unable to demonstrate improvements. Patient returned to a seated position and performed squat pivot transfer to wheelchair with MaxA. Patient wheeled to rehab gym for time management and energy conservation.   Patient gait trained 2 x 10' at R hemi-rail with Mod/MaxA with rehab tech present providing a wheelchair follow- Patient's L UE wrapped around therapist's shoulders for UE/trunk support while therapist provided multimodal cues for improved L LE advancement, placement and stability throughout gait trials. Patient with notable ataxia throughout swing phase of gait requiring assistance for proper placement and leaving his foot in that same spot prior to stepping with R LE. Patient impulsive with functional mobility and requires cues for when to step with his  R LE in order to ensure L knee is blocked for overall safety and stability.   Patient propelled manual wheelchair >150' with R UE/LE and CGA for proper hand placement. Therapist providing external visual cues for directions as patient demonstrates L inattention- Use the R wall as a guide.   Patient returned to his room sitting upright in wheelchair with call bell within reach, family present and all needs met. NT notified of transfer method.    Discharge Criteria: Patient will be discharged from PT if patient refuses treatment 3 consecutive times without medical reason, if treatment goals not met, if there is a change in medical status, if patient makes no progress towards goals or if patient is discharged from hospital.  The above assessment, treatment plan, treatment alternatives and goals were discussed and mutually agreed upon: by patient  Campbell 05/19/2022, 4:26 PM

## 2022-05-19 NOTE — Plan of Care (Signed)
Problem: Sit to Stand Goal: LTG:  Patient will perform sit to stand with assistance level (PT) Description: LTG:  Patient will perform sit to stand with assistance level (PT) Flowsheets (Taken 05/19/2022 1637) LTG: PT will perform sit to stand in preparation for functional mobility with assistance level: Contact Guard/Touching assist   Problem: RH Bed Mobility Goal: LTG Patient will perform bed mobility with assist (PT) Description: LTG: Patient will perform bed mobility with assistance, with/without cues (PT). Flowsheets (Taken 05/19/2022 1637) LTG: Pt will perform bed mobility with assistance level of: Contact Guard/Touching assist   Problem: RH Bed to Chair Transfers Goal: LTG Patient will perform bed/chair transfers w/assist (PT) Description: LTG: Patient will perform bed to chair transfers with assistance (PT). Flowsheets (Taken 05/19/2022 1637) LTG: Pt will perform Bed to Chair Transfers with assistance level: Contact Guard/Touching assist   Problem: RH Car Transfers Goal: LTG Patient will perform car transfers with assist (PT) Description: LTG: Patient will perform car transfers with assistance (PT). Flowsheets (Taken 05/19/2022 1637) LTG: Pt will perform car transfers with assist:: Contact Guard/Touching assist   Problem: RH Ambulation Goal: LTG Patient will ambulate in home environment (PT) Description: LTG: Patient will ambulate in home environment, # of feet with assistance (PT). Flowsheets (Taken 05/19/2022 1637) LTG: Pt will ambulate in home environ  assist needed:: Minimal Assistance - Patient > 75% LTG: Ambulation distance in home environment: 37'   Problem: RH Wheelchair Mobility Goal: LTG Patient will propel w/c in community environment (PT) Description: LTG: Patient will propel wheelchair in community environment, # of feet with assist (PT) Flowsheets (Taken 05/19/2022 1637) LTG: Pt will propel w/c in community environ  assist needed:: Supervision/Verbal cueing Distance:  wheelchair distance in controlled environment: 150   Problem: RH Stairs Goal: LTG Patient will ambulate up and down stairs w/assist (PT) Description: LTG: Patient will ambulate up and down # of stairs with assistance (PT) Flowsheets (Taken 05/19/2022 1637) LTG: Pt will ambulate up/down stairs assist needed:: Minimal Assistance - Patient > 75% LTG: Pt will  ambulate up and down number of stairs: 4

## 2022-05-19 NOTE — Plan of Care (Signed)
Problem: Sit to Stand Goal: LTG:  Patient will perform sit to stand in prep for activites of daily living with assistance level (OT) Description: LTG:  Patient will perform sit to stand in prep for activites of daily living with assistance level (OT) Flowsheets (Taken 05/19/2022 1648) LTG: PT will perform sit to stand in prep for activites of daily living with assistance level: Contact Guard/Touching assist   Problem: RH Eating Goal: LTG Patient will perform eating w/assist, cues/equip (OT) Description: LTG: Patient will perform eating with assist, with/without cues using equipment (OT) Flowsheets (Taken 05/19/2022 1648) LTG: Pt will perform eating with assistance level of: Supervision/Verbal cueing   Problem: RH Grooming Goal: LTG Patient will perform grooming w/assist,cues/equip (OT) Description: LTG: Patient will perform grooming with assist, with/without cues using equipment (OT) Flowsheets (Taken 05/19/2022 1648) LTG: Pt will perform grooming with assistance level of: Supervision/Verbal cueing   Problem: RH Bathing Goal: LTG Patient will bathe all body parts with assist levels (OT) Description: LTG: Patient will bathe all body parts with assist levels (OT) Flowsheets (Taken 05/19/2022 1648) LTG: Pt will perform bathing with assistance level/cueing: Minimal Assistance - Patient > 75%   Problem: RH Dressing Goal: LTG Patient will perform lower body dressing w/assist (OT) Description: LTG: Patient will perform lower body dressing with assist, with/without cues in positioning using equipment (OT) Flowsheets (Taken 05/19/2022 1648) LTG: Pt will perform lower body dressing with assistance level of: Minimal Assistance - Patient > 75%   Problem: RH Toileting Goal: LTG Patient will perform toileting task (3/3 steps) with assistance level (OT) Description: LTG: Patient will perform toileting task (3/3 steps) with assistance level (OT)  Flowsheets (Taken 05/19/2022 1648) LTG: Pt will perform  toileting task (3/3 steps) with assistance level: Minimal Assistance - Patient > 75%   Problem: RH Functional Use of Upper Extremity Goal: LTG Patient will use RT/LT upper extremity as a (OT) Description: LTG: Patient will use right/left upper extremity as a stabilizer/gross assist/diminished/nondominant/dominant level with assist, with/without cues during functional activity (OT) Flowsheets (Taken 05/19/2022 1648) LTG: Use of upper extremity in functional activities: LUE as gross assist level LTG: Pt will use upper extremity in functional activity with assistance level of: Supervision/Verbal cueing   Problem: RH Toilet Transfers Goal: LTG Patient will perform toilet transfers w/assist (OT) Description: LTG: Patient will perform toilet transfers with assist, with/without cues using equipment (OT) Flowsheets (Taken 05/19/2022 1648) LTG: Pt will perform toilet transfers with assistance level of: Contact Guard/Touching assist   Problem: RH Tub/Shower Transfers Goal: LTG Patient will perform tub/shower transfers w/assist (OT) Description: LTG: Patient will perform tub/shower transfers with assist, with/without cues using equipment (OT) Flowsheets (Taken 05/19/2022 1648) LTG: Pt will perform tub/shower stall transfers with assistance level of: Minimal Assistance - Patient > 75% LTG: Pt will perform tub/shower transfers from: Tub/shower combination   Problem: RH Memory Goal: LTG Patient will demonstrate ability for day to day recall/carry over during activities of daily living with assistance level (OT) Description: LTG:  Patient will demonstrate ability for day to day recall/carry over during activities of daily living with assistance level (OT). Flowsheets (Taken 05/19/2022 1648) LTG:  Patient will demonstrate ability for day to day recall/carry over during activities of daily living with assistance level (OT): Supervision   Problem: RH Attention Goal: LTG Patient will demonstrate this level of  attention during functional activites (OT) Description: LTG:  Patient will demonstrate this level of attention during functional activites  (OT) Flowsheets (Taken 05/19/2022 1648) Patient will demonstrate this level  of attention during functional activites: Selective Patient will demonstrate above attention level in the following environment: Home LTG: Patient will demonstrate this level of attention during functional activites (OT): Supervision

## 2022-05-19 NOTE — IPOC Note (Signed)
Overall Plan of Care [facility]) Patient Details Name: Edward Maynard MRN: [id number] DOB: 08/13/1933  Admitting Diagnosis: Right middle cerebral artery stroke [facility])  Hospital Problems: Principal Problem:   Right middle cerebral artery stroke [facility])     Functional Problem List: Nursing Bladder, Safety, Bowel, Endurance, Medication Management  PT Balance, Perception, Behavior, Safety, Sensory, Endurance, Motor  OT Balance, Perception, Cognition, Sensory, Skin Integrity, Endurance, Vision, Motor, Pain  SLP Cognition, Nutrition, Motor  TR         Basic ADL's: OT Grooming, Bathing, Dressing, Toileting     Advanced  ADL's: OT       Transfers: PT Bed Mobility, Bed to Chair, Teacher, early years/pre, Tub/Shower     Locomotion: PT Ambulation, Emergency planning/management officer, Stairs     Additional Impairments: OT Fuctional Use of Upper Extremity  SLP Swallowing, Communication, Social Cognition expression Memory, Awareness  TR      Anticipated Outcomes Item Anticipated Outcome  Self Feeding Supervision  Swallowing  mod I   Basic self-care  min A  Toileting  min A   Bathroom Transfers min A  Bowel/Bladder  manage bowel w mod I and bladder w toileting assist  Transfers  CGA/MinA with LRAD  Locomotion  MinA with LRAD for household distances  Communication  mod I  Cognition  sup A  Pain  n/a  Safety/Judgment  manage w cues   Therapy Plan: PT Intensity: Minimum of 1-2 x/day ,45 to 90 minutes PT Frequency: 5 out of 7 days PT Duration Estimated Length of Stay: 3 weeks OT Intensity: Minimum of 1-2 x/day, 45 to 90 minutes OT Frequency: 5 out of 7 days OT Duration/Estimated Length of Stay: 3-4 weeks SLP Intensity: Minumum of 1-2 x/day, 30 to 90 minutes SLP Frequency: 3 to 5 out of 7 days SLP Duration/Estimated Length of Stay: ~3 weeks   Team Interventions: Nursing Interventions Patient/Family Education, Dysphagia/Aspiration Precaution Training, Bladder Management, Medication Management,  Discharge Planning, Bowel Management, Disease Management/Prevention  PT interventions Ambulation/gait training, Community reintegration, DME/adaptive equipment instruction, Neuromuscular re-education, Psychosocial support, Stair training, UE/LE Strength taining/ROM, Wheelchair propulsion/positioning, Training and development officer, Discharge planning, Functional electrical stimulation, Pain management, UE/LE Coordination activities, Therapeutic Activities, Cognitive remediation/compensation, Disease management/prevention, Functional mobility training, Patient/family education, Splinting/orthotics, Therapeutic Exercise, Visual/perceptual remediation/compensation  OT Interventions Balance/vestibular training, Discharge planning, Functional electrical stimulation, Pain management, Self Care/advanced ADL retraining, Therapeutic Activities, UE/LE Coordination activities, Cognitive remediation/compensation, Disease mangement/prevention, Functional mobility training, Patient/family education, Skin care/wound managment, Therapeutic Exercise, Visual/perceptual remediation/compensation, Academic librarian, Engineer, drilling, Neuromuscular re-education, Psychosocial support, UE/LE Strength taining/ROM, Splinting/orthotics, Wheelchair propulsion/positioning  SLP Interventions Cognitive remediation/compensation, Speech/Language facilitation, Dysphagia/aspiration precaution training, Functional tasks, Patient/family education, Therapeutic Activities  TR Interventions    SW/CM Interventions Discharge Planning, Psychosocial Support, Patient/Family Education, Disease Management/Prevention   Barriers to Discharge MD  Medical stability  Nursing Decreased caregiver support, Home environment access/layout, Incontinence 2 level 2 ste no rails w spouse/caregiver;son in law to stay during the day 8-5 (work from home), daughter to come after work hours till 8 pm, hired caregivers to cover nights/weekends.Pending  SNF placement as daughter will be unable to care for patient after rehab. Daughter is hoping to find a facility where both patient and his wife can be together.  PT Home environment access/layout, Insurance for SNF coverage    OT      SLP      SW Lack of/limited family support, Insurance for SNF coverage, Decreased caregiver support     Team Discharge Planning: Destination: PT-Home ,OT- Home ,  SLP-Home Projected Follow-up: PT-Outpatient PT (Pending progress), OT-  24 hour supervision/assistance, Home health OT, SLP-Other (comment) (TBD) Projected Equipment Needs: PT-To be determined, OT- 3 in 1 bedside comode, Tub/shower bench, SLP-None recommended by SLP Equipment Details: PT-TBD, OT-  Patient/family involved in discharge planning: PT- Patient, Family member/caregiver,  OT-Patient, Family member/caregiver, SLP-Family member/caregiver, Patient  MD ELOS: 14-18d Medical Rehab Prognosis:  Good Assessment: The patient has been admitted for CIR therapies with the diagnosis of RIght MCA infarct. The team will be addressing functional mobility, strength, stamina, balance, safety, adaptive techniques and equipment, self-care, bowel and bladder mgt, patient and caregiver education, BP management , bowel bladder continenece. Goals have been set at [place]. Anticipated discharge destination is Home.        See Team Conference Notes for weekly updates to the plan of care

## 2022-05-19 NOTE — Progress Notes (Signed)
[facility] Individual Statement of Services  Patient Name:  Edward Maynard  Date:  05/19/2022  Welcome to the [place].  Our goal is to provide you with an individualized program based on your diagnosis and situation, designed to meet your specific needs.  With this comprehensive rehabilitation program, you will be expected to participate in at least 3 hours of rehabilitation therapies Monday-Friday, with modified therapy programming on the weekends.  Your rehabilitation program will include the following services:  Physical Therapy (PT), Occupational Therapy (OT), Speech Therapy (ST), 24 hour per day rehabilitation nursing, Therapeutic Recreaction (TR), Neuropsychology, Care Coordinator, Rehabilitation Medicine, Nutrition Services, Pharmacy Services, and Other  Weekly team conferences will be held on Wednesdays to discuss your progress.  Your Inpatient Rehabilitation Care Coordinator will talk with you frequently to get your input and to update you on team discussions.  Team conferences with you and your family in attendance may also be held.  Expected length of stay: 14-18 days   Overall anticipated outcome: min assist, OT min to mod assist   Depending on your progress and recovery, your program may change. Your Inpatient Rehabilitation Care Coordinator will coordinate services and will keep you informed of any changes. Your Inpatient Rehabilitation Care Coordinator's name and contact numbers are listed  below.  The following services may also be recommended but are not provided by the [place]:   [place] will be made to provide these services after discharge if needed.  Arrangements include referral to agencies that provide these services.  Your insurance has been verified to be:   [facility]  Your primary doctor is:  Gaynelle Arabian, MD  Pertinent  information will be shared with your doctor and your insurance company.  Inpatient Rehabilitation Care Coordinator:  Erlene Quan, [place] or ([phone]  Information discussed with and copy given to patient by: Dyanne Iha, 05/19/2022, 10:00 AM

## 2022-05-19 NOTE — Evaluation (Signed)
Speech Language Pathology Assessment and Plan  Patient Details  Name: Edward Maynard MRN: [id number] Date of Birth: [birth year]  SLP Diagnosis: Cognitive Impairments;Speech and Language deficits;Dysphagia  Rehab Potential: Good ELOS: ~3 weeks   Today's Date: 05/19/2022 SLP Individual Time: 1040-1200 SLP Individual Time Calculation (min): 80 min  Hospital Problem: Principal Problem:   Right middle cerebral artery stroke [facility])  Past Medical History:  Past Medical History:  Diagnosis Date   High cholesterol    05/2013; no defecits   Hyperlipemia    Left arm weakness    Left hand weakness    Personal history of colonic polyp - adenoma  10/23/2013   Stroke ([place]) 05/2013   MRI on 07/02/13 = Multifocal acute & subacute infarction involving right frontal MCA/ACA & left parietal MCA/PCA watershed areas   Past Surgical History:  Past Surgical History:  Procedure Laterality Date   IR CT HEAD LTD  05/10/2022   IR PERCUTANEOUS ART THROMBECTOMY/INFUSION INTRACRANIAL INC DIAG ANGIO  05/10/2022   LOOP RECORDER IMPLANT N/A 10/31/2013   Procedure: LOOP RECORDER IMPLANT;  Surgeon: Coralyn Mark, MD;  Location: [place] CATH LAB;  Service: Cardiovascular;  Laterality: N/A;   RADIOLOGY WITH ANESTHESIA N/A 05/10/2022   Procedure: IR WITH ANESTHESIA;  Surgeon: Luanne Bras, MD;  Location: [place];  Service: Radiology;  Laterality: N/A;   SHOULDER SURGERY Bilateral 2003, 1995    Assessment / Plan / Recommendation Clinical Impression  Edward Maynard is an 89 year old right-handed male with history of atrial fibrillation maintained on Xarelto, hyperlipidemia, 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  Per chart review patient lives with spouse.  [name] in the room to assist with history.  Two-level home bed and bath main level.  Independent prior to admission.   Wife with dementia and limited assistance.  Family with good support and plans to provide 24/7 care.  Presented 05/10/2022 with acute onset of left-sided weakness resulting in a fall. Cranial CT scan negative, CT cervical spine negative.  CT angiogram of head and neck showed occlusion of the proximal M2 segment inferior division of the right MCA. He is on a dysphagia #2 thin liquid diet.  Patient denies any pain. Family reports his dysarthria has improved.  Therapy evaluations completed due to patient decreased functional mobility left-sided weakness was admitted for a comprehensive rehab program.   SLP consulted to complete cognitive-linguistic evaluation and clinical swallow evaluation (CSE) in the setting of recent R MCA stroke. Pt presents with mild cognitive-linguistic deficits in the areas of executive functioning, intellectual awareness, and visuospatial skills. Pt achieved a score of 20/30 on SLUMS (n = 27). Pt excelled in most areas with the exception of visual components with minimal awareness of L visual inattention. Daughter feels patient is near cognitive baseline, however, acknowledged the impact of visual deficits on pt's awareness, safety, and likely accuracy with iADLs. Pt managed medications independently at prior level. Pt's son manages finances. Pt's verbal expression and comprehension appeared [facility] for all tasks assessed. Motor speech remarkable for mild dysarthria although minimally impacted speech intelligibility. Pt demonstrated excellent carry over of speech intelligibility techniques learned by acute care SLP. Dtr also appeared very involved and helpful for pt's carry over of skill.   Pt presents primarily with an oral dysphagia characterized by L sided facial and labial weakness impacting labial and mastication further impacted by edentulous status. Dysphagia further supported by  MBS obtained on 05/11/22. See chart for details. Per dtr and pt, oral deficits have already improved since  initial onset of CVA. Pt exhibited no overt s/sx concerning for airway invasion with all tested consistencies at bedside (thin liquid, puree, dys 2). Mastication effectiveness and oral prep improved with dentures in place, however notable for loose fit. Dtr plans to purchase denture adhesive today. At this time, recommend continuation of dysphagia 2 textures and thin liquids, pills whole in puree, dentures in place during all meals. Full supervision recommended for pt safety and implementation of swallowing precautions/strategies, to include small bites/sips, one sip at a time, and assess L cheek for pocketing. Pt/dtr verbalized understanding through teach back.   Pt would benefit from skilled ST intervention in order to maximize functional independence and reduce burden of care prior to discharge. Anticipate that pt will need 24/7 supervision at discharge in addition to [place] follow up at next level of care.    Skilled Therapeutic Interventions          CSE, SLUMS and informal assessment measures administered. Please see report for full details.   SLP Assessment  Patient will need skilled Speech Lanaguage Pathology Services during CIR admission    Recommendations  SLP Diet Recommendations: Dysphagia 2 (Fine chop);Thin Liquid Administration via: Straw;Cup (straw preferred to minimize labial spillage) Medication Administration: Whole meds with puree Supervision: Full supervision/cueing for compensatory strategies;Patient able to self feed Compensations: Slow rate;Small sips/bites;Follow solids with liquid Postural Changes and/or Swallow Maneuvers: Seated upright 90 degrees Oral Care Recommendations: Oral care BID;Staff/trained caregiver to provide oral care Patient destination: Home Follow up Recommendations: Other (comment) (TBD) Equipment Recommended: None recommended by SLP    SLP Frequency 3 to 5 out of 7 days   SLP Duration  SLP Intensity  SLP Treatment/Interventions ~3 weeks  Minumum of  1-2 x/day, 30 to 90 minutes  Cognitive remediation/compensation;Speech/Language facilitation;Dysphagia/aspiration precaution training;Functional tasks;Patient/family education;Therapeutic Activities    Pain    Prior Functioning Type of Home: House  Lives With: Spouse Available Help at Discharge: Family;Available 24 hours/day (son-in-law to asssit during day time; may hire additional assistance for night) Education: high school Vocation: Retired  Programmer, systems Overall Cognitive Status: History of cognitive impairments - at baseline (age releated changes per DTR report) Arousal/Alertness: Awake/alert Attention: Focused;Sustained Focused Attention: Appears intact Sustained Attention: Appears intact Selective Attention: Impaired Selective Attention Impairment: Functional basic Memory: Impaired Memory Impairment: Decreased short term memory;Storage deficit Decreased Short Term Memory: Verbal basic;Functional basic Awareness Impairment: Emergent impairment Problem Solving: Impaired Problem Solving Impairment: Verbal complex;Functional basic Executive Function:  (impaired, continue to assess in fucntional environment) Safety/Judgment: Impaired  Comprehension Auditory Comprehension Overall Auditory Comprehension: Appears within functional limits for tasks assessed Interfering Components: Hearing EffectiveTechniques: Increased volume Expression Expression Primary Mode of Expression: Verbal Verbal Expression Overall Verbal Expression: Appears within functional limits for tasks assessed Written Expression Dominant Hand: Right Oral Motor Oral Motor/Sensory Function Overall Oral Motor/Sensory Function: Mild impairment Facial ROM: Reduced left;Suspected CN VII (facial) dysfunction Facial Symmetry: Abnormal symmetry left;Suspected CN VII (facial) dysfunction Facial Strength: Reduced left;Suspected CN VII (facial) dysfunction Facial Sensation: Reduced left;Suspected CN V  (Trigeminal) dysfunction Lingual ROM: Within Functional Limits Lingual Symmetry: Within Functional Limits Lingual Strength: Within Functional Limits Lingual Sensation: Within Functional Limits Velum: Within Functional Limits Mandible: Within Functional Limits Motor Speech Overall Motor Speech: Impaired Respiration: Within functional limits Phonation: Normal Resonance: Within functional limits Articulation: Impaired Intelligibility: Intelligible Motor Planning: Witnin functional limits Motor Speech Errors: Aware;Consistent Effective Techniques: Slow  rate;Over-articulate  Care Tool Care Tool Cognition Ability to hear (with hearing aid or hearing appliances if normally used Ability to hear (with hearing aid or hearing appliances if normally used): 1. Minimal difficulty - difficulty in some environments (e.g. when person speaks softly or setting is noisy)   Expression of Ideas and Wants Expression of Ideas and Wants: 3. Some difficulty - exhibits some difficulty with expressing needs and ideas (e.g, some words or finishing thoughts) or speech is not clear   Understanding Verbal and Non-Verbal Content Understanding Verbal and Non-Verbal Content: 3. Usually understands - understands most conversations, but misses some part/intent of message. Requires cues at times to understand  Memory/Recall Ability Memory/Recall Ability : That he or she is in a hospital/hospital unit;Current season   Intelligibility: Intelligible  Bedside Swallowing Assessment General Date of Onset: 05/10/22 Previous Swallow Assessment: BSE 05/10/22 Diet Prior to this Study: Dysphagia 2 (finely chopped);Thin liquids (Level 0) Temperature Spikes Noted: No Respiratory Status: Room air History of Recent Intubation: No Behavior/Cognition: Alert;Cooperative;Pleasant mood Oral Cavity - Dentition: Edentulous Self-Feeding Abilities: Needs set up Vision: Impaired for self-feeding Patient Positioning: Upright in  bed Baseline Vocal Quality: Normal Volitional Cough: Strong Volitional Swallow: Able to elicit  Oral Care Assessment Oral Assessment  (WDL): Exceptions to WDL Lips: Pink Teeth: Dentures upper;Dentures lower Tongue: Pink;Moist Mucous Membrane(s): Moist;Pink Saliva: Moist, saliva free flowing Level of Consciousness: Alert Is patient on any of following O2 devices?: None of the above Nutritional status: Dysphagia Oral Assessment Risk : High Risk Ice Chips Ice chips: Not tested Thin Liquid Thin Liquid: Impaired Presentation: Cup;Straw Oral Phase Impairments: Reduced labial seal Nectar Thick Nectar Thick Liquid: Not tested Honey Thick Honey Thick Liquid: Not tested Puree Puree: Within functional limits Solid Solid: Impaired Oral Phase Impairments: Impaired mastication Oral Phase Functional Implications: Impaired mastication BSE Assessment Risk for Aspiration Impact on safety and function: Mild aspiration risk;Moderate aspiration risk Other Related Risk Factors: Previous CVA  Short Term Goals: Week 1: SLP Short Term Goal 1 (Week 1): Patient will consume dysphagia 2 textures and thin liquids with minimal overt s/sx of aspiration and with improved mastication effectiveness with min A verbal cues for implementation of swallowing precautions SLP Short Term Goal 2 (Week 1): Patient will scan left of midline during functional tasks with mod A verbal cues to achieve 50% accuracy SLP Short Term Goal 3 (Week 1): Patient will complete medication management tasks with min-to-mod A verbal cues to achieve 80% accuracy SLP Short Term Goal 4 (Week 1): Patient will complete problem solving tasks with awareness of errors given min-to-mod A verbal cues to ID and repair  Refer to Care Plan for Long Term Goals  Recommendations for other services: None   Discharge Criteria: Patient will be discharged from SLP if patient refuses treatment 3 consecutive times without medical reason, if treatment  goals not met, if there is a change in medical status, if patient makes no progress towards goals or if patient is discharged from hospital.  The above assessment, treatment plan, treatment alternatives and goals were discussed and mutually agreed upon: by patient and by family  Patty Sermons 05/19/2022, 8:02 PM

## 2022-05-19 NOTE — Progress Notes (Signed)
Inpatient Rehabilitation  Patient information reviewed and entered into e[facility] system by Estelita Iten M. Kemauri Musa, M.A., CCC/SLP, PPS Coordinator.  Information including medical coding, functional ability and quality indicators will be reviewed and updated through discharge.    

## 2022-05-19 NOTE — Progress Notes (Signed)
PROGRESS NOTE   Subjective/Complaints:  No issues overnite , likes to be called Edward Maynard  ROS- neg CP, SOB, N/V/D  Objective:   No results found. Recent Labs    05/18/22 0454  WBC 8.1  HGB 15.7  HCT 44.0  PLT 198   Recent Labs    05/18/22 0454  NA 137  K 3.8  CL 104  CO2 22  GLUCOSE 127*  BUN 18  CREATININE 0.98  CALCIUM 9.1    Intake/Output Summary (Last 24 hours) at 05/19/2022 0805 Last data filed at 05/18/2022 1900 Gross per 24 hour  Intake 237 ml  Output --  Net 237 ml        Physical Exam: Vital Signs Blood pressure 132/82, pulse 83, temperature 97.7 F (36.5 C), temperature source Oral, resp. rate 16, height 5' 11.5" (1.816 m), weight 94 kg, SpO2 91 %.   General: No acute distress Mood and affect are appropriate Heart: Regular rate and rhythm no rubs murmurs or extra sounds Lungs: Clear to auscultation, breathing unlabored, no rales or wheezes Abdomen: Positive bowel sounds, soft nontender to palpation, nondistended Extremities: No clubbing, cyanosis, or edema Skin: No evidence of breakdown, no evidence of rash Neurologic: Cranial nerves II through XII intact, motor strength is 5/5 in RIght deltoid, bicep, tricep, grip, hip flexor, knee extensors, ankle dorsiflexor and plantar flexor 3- Left delt , bi , tri, 2- finger flexors and extensors  3- Left HF, KE, ADF  Sensory exam absent LT in LUE and LLE, withdraws to pinch LUE  Cerebellar exam deferred on left due to weakness  Musculoskeletal: Full range of motion in all 4 extremities. No joint swelling   Assessment/Plan: 1. Functional deficits which require 3+ hours per day of interdisciplinary therapy in a comprehensive inpatient rehab setting. Physiatrist is providing close team supervision and 24 hour management of active medical problems listed below. Physiatrist and rehab team continue to assess barriers to discharge/monitor patient progress  toward functional and medical goals  Care Tool:  Bathing              Bathing assist       Upper Body Dressing/Undressing Upper body dressing        Upper body assist      Lower Body Dressing/Undressing Lower body dressing            Lower body assist       Toileting Toileting    Toileting assist       Transfers Chair/bed transfer  Transfers assist           Locomotion Ambulation   Ambulation assist              Walk 10 feet activity   Assist           Walk 50 feet activity   Assist           Walk 150 feet activity   Assist           Walk 10 feet on uneven surface  activity   Assist           Wheelchair     Assist  Wheelchair 50 feet with 2 turns activity    Assist            Wheelchair 150 feet activity     Assist          Blood pressure 132/82, pulse 83, temperature 97.7 F (36.5 C), temperature source Oral, resp. rate 16, height 5' 11.5" (1.816 m), weight 94 kg, SpO2 91 %.  Medical Problem List and Plan: 1. Functional deficits secondary to right large MCA infarction due to right M2 occlusion status post IR/revascularization as well as history of CVA 2015 with left-sided residual weakness status post loop recorder             -patient may  shower             -ELOS/Goals: 14-18 days, PT min A, OT min to mod A, SLP supervision             -Admit to CIR PT, OT, SLP evals              -Continue work on visual scanning as he appears to have a visual field cut on the left 2.  Antithrombotics: -DVT/anticoagulation:  Pharmaceutical: Eliquis             -antiplatelet therapy: N/A 3. Pain Management: Tylenol as needed 4. Mood/Behavior/Sleep: Provide emotional support             -antipsychotic agents: N/A 5. Neuropsych/cognition: This patient is capable of making decisions on his own behalf. 6. Skin/Wound Care: Routine skin checks 7. Fluids/Electrolytes/Nutrition:  Routine in and outs with follow-up chemistries 8.  Dysphagia.  Dysphagia #2 thin liquids.  Follow-up speech therapy 9.  Hypertension.  Norvasc 10 mg daily, Toprol-XL 25 mg daily.  Monitor with increased mobility.  Overall well-controlled.  Goal BP less than 180/105, long-term goal normotensive Vitals:   05/18/22 1958 05/19/22 0349  BP: 124/82 132/82  Pulse: 81 83  Resp: 16 16  Temp: 98.4 F (36.9 C) 97.7 F (36.5 C)  SpO2: 98% 91%  Controlled   10.  Atrial fibrillation.  Cardiac rate controlled.  Continue Toprol-xl 25 mg daily On Eliquis  11.  Hyperlipidemia.  Lipitor 20 mg daily 12.  Parotid mass, follow-up outpatient 13.  CKD 3a.  Recheck labs tomorrow morning- creat improved vs prior       Latest Ref Rng & Units 05/18/2022    4:54 AM 05/16/2022    6:25 AM 05/14/2022    5:33 AM  BMP  Glucose 70 - 99 mg/dL 127  118  111   BUN 8 - 23 mg/dL '18  27  26   '$ Creatinine 0.61 - 1.24 mg/dL 0.98  1.04  1.26   Sodium 135 - 145 mmol/L 137  138  135   Potassium 3.5 - 5.1 mmol/L 3.8  3.7  4.0   Chloride 98 - 111 mmol/L 104  107  101   CO2 22 - 32 mmol/L '22  21  25   '$ Calcium 8.9 - 10.3 mg/dL 9.1  8.7  9.0     LOS: 1 days A FACE TO FACE EVALUATION WAS PERFORMED  Luanna Salk Sharesa Kemp 05/19/2022, 8:05 AM

## 2022-05-19 NOTE — Evaluation (Signed)
Occupational Therapy Assessment and Plan  Patient Details  Name: Edward Maynard MRN: [id number] Date of Birth: [birth year]  OT Diagnosis: abnormal posture, apraxia, ataxia, cognitive deficits, disturbance of vision, hemiplegia affecting non-dominant side, and muscle weakness (generalized) Rehab Potential: Rehab Potential (ACUTE ONLY): Good ELOS: 3-4 weeks   Today's Date: 05/19/2022 OT Individual Time: [id number] OT Individual Time Calculation (min): 73 min     Hospital Problem: Principal Problem:   Right middle cerebral artery stroke [facility])   Past Medical History:  Past Medical History:  Diagnosis Date   High cholesterol    05/2013; no defecits   Hyperlipemia    Left arm weakness    Left hand weakness    Personal history of colonic polyp - adenoma  10/23/2013   Stroke ([place]) 05/2013   MRI on 07/02/13 = Multifocal acute & subacute infarction involving right frontal MCA/ACA & left parietal MCA/PCA watershed areas   Past Surgical History:  Past Surgical History:  Procedure Laterality Date   IR CT HEAD LTD  05/10/2022   IR PERCUTANEOUS ART THROMBECTOMY/INFUSION INTRACRANIAL INC DIAG ANGIO  05/10/2022   LOOP RECORDER IMPLANT N/A 10/31/2013   Procedure: LOOP RECORDER IMPLANT;  Surgeon: Coralyn Mark, MD;  Location: [place] CATH [facility];  Service: Cardiovascular;  Laterality: N/A;   RADIOLOGY WITH ANESTHESIA N/A 05/10/2022   Procedure: IR WITH ANESTHESIA;  Surgeon: Luanne Bras, MD;  Location: [place];  Service: Radiology;  Laterality: N/A;   SHOULDER SURGERY Bilateral 2003, 1995    Assessment & Plan Clinical Impression: Edward Maynard is an 89 year old right-handed male with history of atrial fibrillation maintained on Xarelto, hyperlipidemia, 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  Per chart review patient lives with spouse.  [name] in the room  to assist with history.  Two-level home bed and bath main level.  Independent prior to admission.  Wife with dementia and limited assistance.  Family with good support and plans to provide 24/7 care.  Presented 05/10/2022 with acute onset of left-sided weakness resulting in a fall.  Admission chemistries unremarkable except glucose 158, creatinine 1.33, hemoglobin A1c 5.8.  Cranial CT scan negative, CT cervical spine negative.  CT angiogram of head and neck showed occlusion of the proximal M2 segment inferior division of the right MCA.  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  Nonspecific 15 mm right parotid nodule.  Patient did not receive tPA.  IR consulted underwent revascularization of right M2 distal occlusion.  Echocardiogram with ejection fraction of 60 to 65% no wall motion abnormalities grade 2 diastolic dysfunction.  Neurology follow-up patient has been transition to Eliquis for CVA prophylaxis/atrial fibrillation.  He is on a dysphagia #2 thin liquid diet.  Patient denies any pain.  Family reports his dysarthria has improved.  Therapy evaluations completed due to patient decreased functional mobility left-sided weakness was admitted for a comprehensive rehab program. Patient transferred to CIR on 05/18/2022 .    Patient currently requires max with basic self-care skills secondary to muscle weakness and muscle joint tightness, decreased cardiorespiratoy endurance, impaired timing and sequencing, abnormal tone, unbalanced muscle activation, motor apraxia, ataxia, decreased coordination, and decreased motor planning, decreased visual perceptual skills, decreased visual motor skills, field cut, and hemianopsia, decreased midline orientation, decreased attention to left, decreased motor planning, and ideational apraxia, decreased initiation, decreased attention, decreased awareness, decreased problem solving, decreased safety awareness,  decreased memory, and delayed  processing, central origin, and decreased sitting balance, decreased standing balance, decreased postural control, hemiplegia, and decreased balance strategies.  Prior to hospitalization, patient could complete BDLs and IADLs with independent .  Patient will benefit from skilled intervention to decrease level of assist with basic self-care skills, increase independence with basic self-care skills, and increase level of independence with iADL prior to discharge home with care partner.  Anticipate patient will require minimal physical assistance and follow up home health.  OT - End of Session Activity Tolerance: Decreased this session Endurance Deficit: Yes Endurance Deficit Description: Global deconditioning with poor activity tolerance OT Assessment Rehab Potential (ACUTE ONLY): Good OT Patient demonstrates impairments in the following area(s): Balance;Perception;Cognition;Sensory;Skin Integrity;Endurance;Vision;Motor;Pain OT Basic ADL's Functional Problem(s): Grooming;Bathing;Dressing;Toileting OT Transfers Functional Problem(s): Toilet;Tub/Shower OT Additional Impairment(s): Fuctional Use of Upper Extremity OT Plan OT Intensity: Minimum of 1-2 x/day, 45 to 90 minutes OT Frequency: 5 out of 7 days OT Duration/Estimated Length of Stay: 3-4 weeks OT Treatment/Interventions: Balance/vestibular training;Discharge planning;Functional electrical stimulation;Pain management;Self Care/advanced ADL retraining;Therapeutic Activities;UE/LE Coordination activities;Cognitive remediation/compensation;Disease mangement/prevention;Functional mobility training;Patient/family education;Skin care/wound managment;Therapeutic Exercise;Visual/perceptual remediation/compensation;Community reintegration;DME/adaptive equipment instruction;Neuromuscular re-education;Psychosocial support;UE/LE Strength taining/ROM;Splinting/orthotics;Wheelchair propulsion/positioning OT Self Feeding Anticipated Outcome(s): Supervision OT  Basic Self-Care Anticipated Outcome(s): min A OT Toileting Anticipated Outcome(s): min A OT Bathroom Transfers Anticipated Outcome(s): min A OT Recommendation Patient destination: Home Follow Up Recommendations: 24 hour supervision/assistance;Home health OT Equipment Recommended: 3 in 1 bedside comode;Tub/shower bench   OT Evaluation Precautions/Restrictions  Precautions Precautions: Fall Precaution Comments: Left hemi; Left homonymous hemianopsia Restrictions Weight Bearing Restrictions: No General Chart Reviewed: Yes Additional Pertinent History: previous frontal MCA/ACA, L parietal MCA/PCA watershed infarction Family/Caregiver Present: Yes (DTR, [name]) Vital Signs Therapy Vitals Temp: 98.4 F (36.9 C) Pulse Rate: 81 Resp: 17 BP: 139/80 Patient Position (if appropriate): Lying Oxygen Therapy SpO2: 90 % O2 Device: Room Air Pain   Home Living/Prior Functioning Home Living Living Arrangements: Spouse/significant other Available Help at Discharge: Family, Available 24 hours/day (son-in-law to asssit during day time; may hire additional assistance for night) Type of Home: House Home Access: Stairs to enter [employer] of Steps: 2 steps going through the front door and 1 step going through the garage Entrance Stairs-Rails: None Home Layout: Two level, Able to live on main level with bedroom/bathroom, Full bath on main level Alternate Level Stairs-Number of Steps: 13 steps Bathroom Shower/Tub: Chiropodist: Handicapped height Bathroom Accessibility: Yes Additional Comments: Pt reports that he did not use any DME at baseline  Lives With: Spouse IADL History Homemaking Responsibilities: Yes Meal Prep Responsibility: No Laundry Responsibility: Secondary Cleaning Responsibility: Secondary Bill Paying/Finance Responsibility: No Shopping Responsibility: Secondary Child Care Responsibility: No Homemaking Comments: Pt performs his own  Best boy Current License: Yes (does not drive often) Mode of Transportation: Car Education: high school Occupation: Retired Type of Occupation: Worked at [employer] Prior Function Level of Independence: Independent with basic ADLs, Independent with transfers, Independent with gait  Able to Take Stairs?: Yes Driving: Yes Vocation: Retired Leisure: Hobbies-yes (Comment) Vision Baseline Vision/History: 0 No visual deficits Ability to See in Adequate Light: 0 Adequate Patient Visual Report: No change from baseline (Pt unaware of visual field deficit, education provided) Vision Assessment?: Yes Eye Alignment: Within Functional Limits Ocular Range of Motion: Restricted on the left;Impaired-to be further tested in functional context Alignment/Gaze Preference: Head turned;Gaze right Tracking/Visual Pursuits: Requires cues, head turns, or add eye shifts to track;Impaired - to be further tested in functional context;Left eye does not  track laterally Saccades: Impaired - to be further tested in functional context;Additional head turns occurred during testing;Decreased speed of saccadic movement Convergence: Impaired (comment) (no double vision reported, Pt presenting wiht decreased attention impacting ability to follow directions) Visual Fields: Left homonymous hemianopsia;Left visual field deficit Depth Perception:  (impaired, to be tested further in functional environment) Additional Comments: Required multimodal cues for attending to the L side of his body as well as objects on his L side Perception  Perception: Impaired Inattention/Neglect: Does not attend to left visual field;Does not attend to left side of body Spatial Orientation: impaired Praxis Praxis: Impaired Praxis Impairment Details: Motor planning;Initiation;Ideomotor;Perseveration Praxis-Other Comments: apraxic Cognition Cognition Overall Cognitive Status: History of cognitive impairments - at baseline (age  releated changes per DTR report) Arousal/Alertness: Awake/alert Orientation Level: Person;Place;Situation Person: Oriented Place: Oriented Situation: Oriented Memory: Impaired Memory Impairment: Decreased short term memory;Storage deficit Decreased Short Term Memory: Verbal basic;Functional basic Attention: Focused;Sustained Focused Attention: Appears intact Sustained Attention: Appears intact Selective Attention: Impaired Selective Attention Impairment: Functional basic Awareness: Impaired Awareness Impairment: Emergent impairment Problem Solving: Impaired Problem Solving Impairment: Verbal complex;Functional basic Executive Function:  (impaired, continue to assess in fucntional environment) Safety/Judgment: Impaired Brief Interview for Mental Status (BIMS) Repetition of Three Words (First Attempt): 3 Temporal Orientation: Year: Correct Temporal Orientation: Month: Accurate within 5 days Temporal Orientation: Day: Incorrect Recall: "Sock": Yes, no cue required Recall: "Blue": Yes, no cue required Recall: "Bed": Yes, after cueing ("a piece of furniture") BIMS Summary Score: 13 Sensation Sensation Light Touch: Impaired Detail Light Touch Impaired Details: Impaired LUE;Impaired LLE Hot/Cold: Not tested Proprioception: Impaired Detail Proprioception Impaired Details: Impaired LLE;Impaired LUE Stereognosis: Not tested Coordination Gross Motor Movements are Fluid and Coordinated: No Fine Motor Movements are Fluid and Coordinated: No Coordination and Movement Description: Ataxic with poor coordination, motor planning, initiation Finger Nose Finger Test: dysmetira noted BUEs, ataxia, and poor coordination Motor  Motor Motor: Hemiplegia;Motor apraxia;Ataxia Motor - Skilled Clinical Observations: L hemi (L UE weaker than L LE) with significant motor apraxia/ataxia of L LE  Trunk/Postural Assessment  Cervical Assessment Cervical Assessment: Exceptions to [facility] (forward  head) Thoracic Assessment Thoracic Assessment: Exceptions to [facility] (rounded shoudlers) Lumbar Assessment Lumbar Assessment: Exceptions to [facility] (posterior pelvic tilt) Postural Control Postural Control: Deficits on evaluation Righting Reactions: Delayed and inadequate Protective Responses: delayed  Balance Balance Balance Assessed: Yes Static Sitting Balance Static Sitting - Balance Support: Feet supported;Right upper extremity supported Static Sitting - Level of Assistance: 4: Min assist (CGA) Dynamic Sitting Balance Dynamic Sitting - Balance Support: During functional activity;Feet unsupported Dynamic Sitting - Level of Assistance: 3: Mod assist;4: Min assist Sitting balance - Comments: Pt initally requiring mod A for sitting balance, however, transitioning to CGA with tactile cues and encouragement to locate midline and engage core Static Standing Balance Static Standing - Balance Support: Bilateral upper extremity supported Static Standing - Level of Assistance: 3: Mod assist Dynamic Standing Balance Dynamic Standing - Balance Support: During functional activity;Right upper extremity supported Dynamic Standing - Level of Assistance: 1: +2 Total assist Extremity/Trunk Assessment RUE Assessment RUE Assessment: Within Functional Limits Passive Range of Motion (PROM) Comments: WFL Active Range of Motion (AROM) Comments: WFL General Strength Comments: 5/5 LUE Assessment LUE Assessment: Exceptions to [facility] Passive Range of Motion (PROM) Comments: WFL Active Range of Motion (AROM) Comments: limited to ~100 degrees at shoudler level, limited greater distally>proximally General Strength Comments: increase strength noted praximally>distally; shoudler flexion 3/5, bicep 3/5, tricep, 3/5, wrist flexion/extension 2/5, finger flexion/extension 2/5  Care  Tool Care Tool Self Care Eating   Eating Assist Level: Moderate Assistance - Patient 50 - 74%    Oral Care    Oral Care Assist Level:  Moderate Assistance - Patient 50 - 74%    Bathing   Body parts bathed by patient: Left arm;Right arm;Chest;Abdomen;Right upper leg;Left upper leg;Front perineal area Body parts bathed by helper: Right arm;Left arm;Front perineal area;Buttocks;Left lower leg;Right lower leg   Assist Level: Total Assistance - Patient < 25%    Upper Body Dressing(including orthotics)   What is the patient wearing?: Pull over shirt   Assist Level: Maximal Assistance - Patient 25 - 49%    Lower Body Dressing (excluding footwear)   What is the patient wearing?: Pants;Underwear/pull up Assist for lower body dressing: Total Assistance - Patient < 25%    Putting on/Taking off footwear   What is the patient wearing?: Socks;Shoes Assist for footwear: Dependent - Patient 0%       Care Tool Toileting Toileting activity Toileting Activity did not occur (Clothing management and hygiene only): N/A (no void or bm)       Care Tool Bed Mobility Roll left and right activity   Roll left and right assist level: Maximal Assistance - Patient 25 - 49%    Sit to lying activity   Sit to lying assist level: Maximal Assistance - Patient 25 - 49%    Lying to sitting on side of bed activity   Lying to sitting on side of bed assist level: the ability to move from lying on the back to sitting on the side of the bed with no back support.: Maximal Assistance - Patient 25 - 49%     Care Tool Transfers Sit to stand transfer   Sit to stand assist level: Maximal Assistance - Patient 25 - 49%    Chair/bed transfer   Chair/bed transfer assist level: Maximal Assistance - Patient 25 - 49%     Toilet transfer   Assist Level: Dependent - Patient 0% (stedy)     Care Tool Cognition  Expression of Ideas and Wants Expression of Ideas and Wants: 3. Some difficulty - exhibits some difficulty with expressing needs and ideas (e.g, some words or finishing thoughts) or speech is not clear  Understanding Verbal and Non-Verbal Content  Understanding Verbal and Non-Verbal Content: 3. Usually understands - understands most conversations, but misses some part/intent of message. Requires cues at times to understand   Memory/Recall Ability Memory/Recall Ability : That he or she is in a hospital/hospital unit;Current season   Refer to Care Plan for Long Term Goals  SHORT TERM GOAL WEEK 1 OT Short Term Goal 1 (Week 1): Pt will maintain dyanmic sitting balance with CGA during BADLs/functional activities OT Short Term Goal 2 (Week 1): Pt will located 5/5 items on L side of sink/tray/table with min cueing OT Short Term Goal 3 (Week 1): Pt will complete UB dressing min A OT Short Term Goal 4 (Week 1): Pt will self-feed with min A  Recommendations for other services: None    Skilled Therapeutic Intervention Skilled Therapeutic Intervention/Progress Updates:  1:1 OT evaluation and intervention initiated with education provided on OT role, goals, and POC. Pt received resting in bed with DTR, [name], present in room with Pt in good spirits and motivated to participate in skilled OT session. Pt transitioned to EOB max A using log rolling technique to R side with Pt able to utilize RUE to initiate push to lift trunk and assistance required to  bring legs off bed. Sitting EOB, Pt initially requiring mod A for sitting balance, however transitioning to CGA with cueing and practice. OT facilitated providing HOH assistance to LUE to wash face, RUE, and trunk with Pt initiating movement ~50% of the time. Pt requiring mod A to maintain sitting balance during BADLs d/t increased challenge of duel tasking. Pt completing LB bathing with max A. Educated pt and Dtr on hemi dressing technique with Pt able to don shirt max A. Max A required to weave BLEs into pants and bring pants to waist in standing. However Pt able to complete sit>stand using RW with mod A and maintain static balance min A. Attempted side step and marches in place, however quickly stopped d/t  Pt ataxia and to increase Pt safety. Pt stand>sit mod A with max cueing for technique. Pt was transitioned to supine total A with education provided on technique. Pt left resting in bed with call bell in reach, bed alarm on, and all needs met.   ADL ADL Eating: Moderate assistance Where Assessed-Eating: Bed level Grooming: Moderate assistance Where Assessed-Grooming: Edge of bed Upper Body Bathing: Moderate assistance;Maximal assistance Where Assessed-Upper Body Bathing: Edge of bed Lower Body Bathing: Maximal assistance Where Assessed-Lower Body Bathing: Edge of bed Upper Body Dressing: Moderate assistance;Maximal assistance Where Assessed-Upper Body Dressing: Edge of bed Lower Body Dressing: Maximal assistance Where Assessed-Lower Body Dressing: Edge of bed Toileting: Dependent;Maximal assistance Where Assessed-Toileting: Bed level Toilet Transfer: Dependent;Maximal assistance (stedy, able to pull self to standing; not safety to atempt squat pivot or ambulate to bathroom) Toilet Transfer Equipment: Bedside commode Tub/Shower Transfer: Not assessed Social research officer, government: Dependent;Maximal assistance (stedy) Social research officer, government Method: Other (comment) (stedy) Walk-In Shower Equipment: Manufacturing systems engineer  Bed Mobility Bed Mobility: Rolling Right;Rolling Left;Supine to Sit;Sit to Supine Rolling Right: Moderate Assistance - Patient 50-74%;Maximal Assistance - Patient 25-49% Rolling Left: Minimal Assistance - Patient > 75% Supine to Sit: Maximal Assistance - Patient - Patient 25-49% Sit to Supine: Moderate Assistance - Patient 50-74% Transfers Sit to Stand: Moderate Assistance - Patient 50-74% Stand to Sit: Moderate Assistance - Patient 50-74%   Discharge Criteria: Patient will be discharged from OT if patient refuses treatment 3 consecutive times without medical reason, if treatment goals not met, if there is a change in medical status, if patient makes no progress  towards goals or if patient is discharged from hospital.  The above assessment, treatment plan, treatment alternatives and goals were discussed and mutually agreed upon: by patient and by family  Janey Genta 05/19/2022, 4:37 PM

## 2022-05-19 NOTE — Plan of Care (Signed)
  Problem: RH Swallowing Goal: LTG Patient will consume least restrictive diet using compensatory strategies with assistance (SLP) Description: LTG:  Patient will consume least restrictive diet using compensatory strategies with assistance (SLP) Flowsheets (Taken 05/19/2022 2023) LTG: Pt Patient will consume least restrictive diet using compensatory strategies with assistance of (SLP): Modified Independent Goal: LTG Pt will demonstrate functional change in swallow as evidenced by bedside/clinical objective assessment (SLP) Description: LTG: Patient will demonstrate functional change in swallow as evidenced by bedside/clinical objective assessment (SLP) Flowsheets (Taken 05/19/2022 2023) LTG: Patient will demonstrate functional change in swallow as evidenced by bedside/clinical objective assessment: Oral swallow   Problem: RH Problem Solving Goal: LTG Patient will demonstrate problem solving for (SLP) Description: LTG:  Patient will demonstrate problem solving for basic/complex daily situations with cues  (SLP) Flowsheets (Taken 05/19/2022 2023) LTG Patient will demonstrate problem solving for: Supervision   Problem: RH Awareness Goal: LTG: Patient will demonstrate awareness during functional activites type of (SLP) Description: LTG: Patient will demonstrate awareness during functional activites type of (SLP) Flowsheets (Taken 05/19/2022 2023) Patient will demonstrate during cognitive/linguistic activities awareness type of: Emergent LTG: Patient will demonstrate awareness during cognitive/linguistic activities with assistance of (SLP): Minimal Assistance - Patient > 75%

## 2022-05-19 NOTE — Progress Notes (Signed)
Inpatient Rehabilitation Care Coordinator Assessment and Plan Patient Details  Name: Edward Maynard MRN: [id number] Date of Birth: 10/25/1933  Today's Date: 05/19/2022  Hospital Problems: Principal Problem:   Right middle cerebral artery stroke [facility])  Past Medical History:  Past Medical History:  Diagnosis Date   High cholesterol    05/2013; no defecits   Hyperlipemia    Left arm weakness    Left hand weakness    Personal history of colonic polyp - adenoma  10/23/2013   Stroke ([place]) 05/2013   MRI on 07/02/13 = Multifocal acute & subacute infarction involving right frontal MCA/ACA & left parietal MCA/PCA watershed areas   Past Surgical History:  Past Surgical History:  Procedure Laterality Date   IR CT HEAD LTD  05/10/2022   IR PERCUTANEOUS ART THROMBECTOMY/INFUSION INTRACRANIAL INC DIAG ANGIO  05/10/2022   LOOP RECORDER IMPLANT N/A 10/31/2013   Procedure: LOOP RECORDER IMPLANT;  Surgeon: Coralyn Mark, MD;  Location: [place] CATH [facility];  Service: Cardiovascular;  Laterality: N/A;   RADIOLOGY WITH ANESTHESIA N/A 05/10/2022   Procedure: IR WITH ANESTHESIA;  Surgeon: Luanne Bras, MD;  Location: [place];  Service: Radiology;  Laterality: N/A;   SHOULDER SURGERY Bilateral 2003, 1995   Social History:  reports that he quit smoking about 52 years ago. His smoking use included cigarettes. He has never used smokeless tobacco. He reports that he does not drink alcohol and does not use drugs.  Family / Support Systems Spouse/Significant Other: [name] Children: Daughter, [name] Other Supports: N/A Anticipated Caregiver: Daughter, [name] (Engineer, maintenance) Ability/Limitations of Caregiver: Min A-Mobility and Mob-ADL Caregiver Availability: 24/7 (Family working on arranging) Family Dynamics: support from daughter and spouse  Social History Preferred language: English Religion:  Education: [employer] - How often do you need to have someone help you when you read instructions, pamphlets, or  other written material from your doctor or pharmacy?: Never Writes: Yes Legal History/Current Legal Issues: N/A Guardian/Conservator: Isaias Cowman   Abuse/Neglect Abuse/Neglect Assessment Can Be Completed: Yes Physical Abuse: Denies Verbal Abuse: Denies Sexual Abuse: Denies Exploitation of patient/patient's resources: Denies Self-Neglect: Denies  Patient response to: Social Isolation - How often do you feel lonely or isolated from those around you?: Never  Emotional Status Pt's affect, behavior and adjustment status: pleasant Recent Psychosocial Issues: coping Psychiatric History: N/A Substance Abuse History: N/A  Patient / Family Perceptions, Expectations & Goals Pt/Family understanding of illness & functional limitations: yes, daughter at bedside Premorbid pt/family roles/activities: Independent overall Anticipated changes in roles/activities/participation: Spouse and daughter able to assist at d/c. Pt/family expectations/goals: supervision  Recruitment consultant: None Premorbid Home Care/DME Agencies: None Transportation available at discharge: daughter or SIL Is the patient able to respond to transportation needs?: Yes In the past 12 months, has lack of transportation kept you from medical appointments or from getting medications?: No In the past 12 months, has lack of transportation kept you from meetings, work, or from getting things needed for daily living?: No  Discharge Planning Living Arrangements: Spouse/significant other Support Systems: Spouse/significant other, Children Type of Residence: Private residence (2 level home, 2 steps to enter. Able to maintain on main level) Insurance Resources: Multimedia programmer (specify) Sports administrator) Financial Resources: Radio broadcast assistant Screen Referred: No Living Expenses: Own Money Management: Patient, Spouse Does the patient have any problems obtaining your medications?: No Home Management:  Independent Patient/Family Preliminary Plans: Plans to remai independent. Family able to assist if needed Care Coordinator Barriers to Discharge: Lack of/limited family support, Insurance  for SNF coverage, Decreased caregiver support Care Coordinator Anticipated Follow Up Needs: [facility] Additional Notes/Comments: Family working on [employer]. SIL [place] and able to stay with patient 8-5, daughter home after 8PM and potentially hiring caregiver assistance. Potential 8p-9am and weekends for hired assistance  Clinical Impression SW met with patient and daughter, introduced self and explained role. Patient anticipates discharging home with spouse, daughter and SIL to assist. Family working on [employer]. SIL [place] and able to stay with patient 8-5, daughter home after 8PM and potentially hiring caregiver assistance. Potential 8p-9am and weekends for hired assistance. Patient lives in a 2 level home, with bed and bath on main level with 2 steps to enter home. No additional questions or concerns.    Dyanne Iha 05/19/2022, 12:27 PM

## 2022-05-20 NOTE — Plan of Care (Signed)
  Problem: Consults Goal: RH STROKE PATIENT EDUCATION Description: See Patient Education module for education specifics  Outcome: Progressing   Problem: RH BLADDER ELIMINATION Goal: RH STG MANAGE BLADDER WITH ASSISTANCE Description: STG Manage Bladder With toileting Assistance Outcome: Progressing

## 2022-05-20 NOTE — Progress Notes (Addendum)
Patient slept on and off last night despite trazadone. Called appropriately for urinal. Needs attended.

## 2022-05-20 NOTE — Progress Notes (Signed)
Physical Therapy Session Note  Patient Details  Name: Edward Maynard MRN: [id number] Date of Birth: [birth year]  Today's Date: 05/20/2022 PT Individual Time: 1445-1545 PT Individual Time Calculation (min): 60 min   Short Term Goals: Week 1:  PT Short Term Goal 1 (Week 1): Patient will perform bed mobility with MinA PT Short Term Goal 2 (Week 1): Patient will perform bed/chair transfer with LRAD and ModA PT Short Term Goal 3 (Week 1): Patient will ambulate 4' with LRAD and MaxA x1  Skilled Therapeutic Interventions/Progress Updates:  Patient greeted sitting in bedside recliner with daughter and friend present and agreeable to PT treatment session. Patient performed squat pivot transfer from bedside recliner to wheelchair with Mod/MaxA- Increased time required to complete with multimodal cues as patient demonstrated impulsivity in preparation for transfer with poor body awareness and overall control. Therapist required to stop patient several times as he was initiating unsafe movements. Patient wheeled to rehab gym for time management and energy conservation.   Patient stood in hallway at hemi-rail x2 trials with Mod/MaxA- During initial stand, patient demonstrated significant L lateral lean however with multimodal cues for anterior and right lateral weight shift in preparation for standing the second time, patient was able to demonstrate significant improvements in overall stability. Patient tolerated standing ~2-3 minutes while performing various weight shifting activities in order to improve overall midline posturing and body awareness. Mirror placed in front of patient for external visual cues regarding posturing. Therapist providing TC to L knee for improved TKE throughout stand, as well as at chest and hips for improved postural extension and forward gaze. Patient required extended seated rest break in between each trial secondary to fatigue. Per daughter's report, patient had been sitting up all  day.   Patient returned to his room with request for using the restroom for a BM- Therapist and rehab tech assisted patient to sitting on the commode over toilet in the bathroom with the use of the stedy. Patient with continent BM and stood in the stedy while therapist performed back pericare, donned brief and pulled up pants and rehab tech provided VC/TC for improved midline posturing. Patient transitioned to sitting EOB via stedy and then to supine with MaxA for trunk and B LE management secondary to fatigue. Patient left supine in bed with bed alarm on, call bell within reach, spouse present and all needs met.    Therapy Documentation Precautions:  Precautions Precautions: Fall Precaution Comments: Left hemi; Left homonymous hemianopsia Restrictions Weight Bearing Restrictions: No  Therapy/Group: Individual Therapy  Sherronda Sweigert 05/20/2022, 7:49 AM

## 2022-05-20 NOTE — Progress Notes (Signed)
Occupational Therapy Session Note  Patient Details  Name: Edward Maynard MRN: [id number] Date of Birth: Nov 09, 1933  Today's Date: 05/20/2022 Session 1 OT Individual Time: 0850-1005 OT Individual Time Calculation (min): 75 min   Session 2 OT Individual Time: [id number] OT Individual Time Calculation (min): 43 min    Short Term Goals: Week 1:  OT Short Term Goal 1 (Week 1): Pt will maintain dyanmic sitting balance with CGA during BADLs/functional activities OT Short Term Goal 2 (Week 1): Pt will located 5/5 items on L side of sink/tray/table with min cueing OT Short Term Goal 3 (Week 1): Pt will complete UB dressing min A OT Short Term Goal 4 (Week 1): Pt will self-feed with min A  Skilled Therapeutic Interventions/Progress Updates:    Session 1 Pt greeted semi-reclined in bed with daughter and son in law present. OT treatment session focused on self care retaining and functional use of L UE at shower level.Pt needed mod A to get to sitting EOB with pt unaware that he left L UE completely behind him. Stedy used for shower transfer with +2 needed to maintain sitting balance at times on Stedy. Pt with no awareness or proprioception of L UE. Forced use of L UE while showering. Hand over hand to maintain grasp on wash cloth and achieve appropriate force. Utilized cut out on bench to wash buttocks-max A overall for all LB bathing. Stedy transfer out of shower with max A and +2 for safety when transitioning out of bathroom on Stedy. OT educated on hemi dressing techniques with moderate assistance overall for UB. Pt with some fear of falling and difficulty leaning forward to assist with LB dressing. Practiced sit<.> stands at the sink with mirror feedback to bring awareness to midline. Pt with some pusher tendencies in standing as well. OT povided tubigrip and elbow pad to protect L UE and for edema management. Pt left seated in wc with alarm belt on, call bell in reach, family present and needs met.     Session 2 Pt greeted seated in recliner with family present and agreeable to OT treatment session. Pt impulsive to get up to wc before OT was ready for transfer requiring cues to slow down. Max A for stand-pivot to wc with +2 for safety due to pushing to the L. Pt brought to therapy gym and worked on functional use of L UE with focus on grasp and release of foam block. Max cues to attend to L hand. Graded peg board task using large pegs with grippers on them. Hand over hand A to release peg, but able to motor plan grasp. BITS used for L attention and initiating use of L UE. Utilized the largest dots so pt could use closed fist to hit dots. Guided A from OT and moderate cues to locate dots on L side. Pt returned to room and reported need to urinate. Stedy used for sit<>stand with max A and max cues to lean to his R side to decrease pushing. Total A to manage clothing and place urinal, but pt styaed standing for duration of void. Stedy transfer to the recliner. Pt left seated in recliner with family present and lunch tray. Alarm belt on and needs met.  Therapy Documentation Precautions:  Precautions Precautions: Fall Precaution Comments: Left hemi; Left homonymous hemianopsia Restrictions Weight Bearing Restrictions: No Pain:   No pain   Therapy/Group: Individual Therapy  Valma Cava 05/20/2022, 1:06 PM

## 2022-05-20 NOTE — Progress Notes (Signed)
Speech Language Pathology Daily Session Note  Patient Details  Name: Edward Maynard MRN: Edward Maynard Date of Birth: 1933-06-09  Today's Date: 05/20/2022 SLP Individual Time: 1115-1200 SLP Individual Time Calculation (min): 45 min  Short Term Goals: Week 1: SLP Short Term Goal 1 (Week 1): Patient will consume dysphagia 2 textures and thin liquids with minimal overt s/sx of aspiration and with improved mastication effectiveness with min A verbal cues for implementation of swallowing precautions SLP Short Term Goal 2 (Week 1): Patient will scan left of midline during functional tasks with mod A verbal cues to achieve 50% accuracy SLP Short Term Goal 3 (Week 1): Patient will complete medication management tasks with min-to-mod A verbal cues to achieve 80% accuracy SLP Short Term Goal 4 (Week 1): Patient will complete problem solving tasks with awareness of errors given min-to-mod A verbal cues to ID and repair  Skilled Therapeutic Interventions: Pt seen this date for skilled ST intervention targeting swallowing and cognitive goals outlined above. Pt received awake/alert and reclined in recliner chair. Daughter and son-in-law present. Agreeable to intervention in hospital room. Pleasant and participatory. Hard of hearing and benefits from elevated voice and clear mask.  Today's session with emphasis on diet tolerance monitoring, trials of advanced textures, and cognitive skill training as it relates to medication management. Pt consumed Dysphagia 2 and thin liquids via straw with no overt s/sx concerning for airway intrusion and only slightly prolonged mastication with anterior oral spillage on L x 1 - required assistance to clear despite use of mirror. Tolerated therapeutic trials of Dysphagia 3 textures with no overt s/sx concerning for airway intrusion and functional oral transit, manipulation, and transit. Minimal oral residuals noted post-swallow. Continue to recommend current diet textures at this  time with supervision. Will plan to continue trials of advanced textures with SLP prior to upgrade.  Re: cognition, pt benefited from Max A, faded to Mod-Max A multimodal cues to visually scan to L field during conversational tasks and self-feeding - provided education re: use of lighthouse scanning method, which pt began to implement more consistently at the end of today's session following consistent Max A. When provided with external aid (list of her 5 scheduled medications), pt named 5 out of 5 of his current medications and purpose given overall Max A verbal and visual cues. Pt unable to recall without use of list - prior to admission, pt knew all four of his prescribed medications without cues (mispronounced names at times, per daughter's report). Provided education to pt and pt's family re: sx's following R MCA CVA to include cognitive deficits and L neglect. Additionally, encouraged family to sit on pt's L side and encouraged pt to visually scan to L visual field utilizing lighthouse techniques utilized today - pt and family verbalized understanding.   Pt left in room and OOB in recliner chair with all safety measures activated, call bell within reach, and all immediate needs met. Daughter and son-in-law remained at bedside. Continue per current ST POC.  Pain No pain reported; NAD  Therapy/Group: Individual Therapy  Edward Maynard A Edward Maynard 05/20/2022, 1:33 PM

## 2022-05-21 MED ORDER — MELATONIN 3 MG PO TABS
3.0000 mg | ORAL_TABLET | Freq: Every day | ORAL | Status: DC
  Administered 2022-05-21: 3 mg via ORAL
  Filled 2022-05-21: qty 1

## 2022-05-21 MED ORDER — HYDROXYZINE HCL 10 MG PO TABS
10.0000 mg | ORAL_TABLET | Freq: Four times a day (QID) | ORAL | Status: DC | PRN
  Administered 2022-05-21 – 2022-06-06 (×19): 10 mg via ORAL
  Filled 2022-05-21 (×23): qty 1

## 2022-05-21 MED ORDER — MIRTAZAPINE 15 MG PO TBDP
15.0000 mg | ORAL_TABLET | Freq: Every day | ORAL | Status: DC
  Administered 2022-05-21 – 2022-06-07 (×18): 15 mg via ORAL
  Filled 2022-05-21 (×18): qty 1

## 2022-05-21 NOTE — Progress Notes (Signed)
PROGRESS NOTE   Subjective/Complaints:  Poor sleep per nsg, received trazodone, states he has no pains or bowl/bladder issues at noc.  No excessive noise in halls. No insomnia at home   ROS- neg CP, SOB, N/V/D  Objective:   No results found. Recent Labs    05/19/22 0848  WBC 8.1  HGB 16.3  HCT 46.0  PLT 210    Recent Labs    05/19/22 0848  NA 134*  K 3.7  CL 99  CO2 25  GLUCOSE 182*  BUN 23  CREATININE 1.14  CALCIUM 9.2     Intake/Output Summary (Last 24 hours) at 05/21/2022 1120 Last data filed at 05/21/2022 0841 Gross per 24 hour  Intake 980 ml  Output 1000 ml  Net -20 ml         Physical Exam: Vital Signs Blood pressure 128/82, pulse 82, temperature (!) 97.5 F (36.4 C), temperature source Oral, resp. rate 16, height 5' 11.5" (1.816 m), weight 94 kg, SpO2 94 %.   General: No acute distress Mood and affect are appropriate Heart: Regular rate and rhythm no rubs murmurs or extra sounds Lungs: Clear to auscultation, breathing unlabored, no rales or wheezes Abdomen: Positive bowel sounds, soft nontender to palpation, nondistended Extremities: No clubbing, cyanosis, or edema Skin: No evidence of breakdown, no evidence of rash Neurologic: Cranial nerves II through XII intact, motor strength is 5/5 in RIght deltoid, bicep, tricep, grip, hip flexor, knee extensors, ankle dorsiflexor and plantar flexor 3- Left delt , bi , tri, 2- finger flexors and extensors  3- Left HF, KE, ADF  Sensory exam absent LT in LUE and LLE, withdraws to pinch LUE  Cerebellar exam deferred on left due to weakness  Musculoskeletal: Full range of motion in all 4 extremities. No joint swelling   Assessment/Plan: 1. Functional deficits which require 3+ hours per day of interdisciplinary therapy in a comprehensive inpatient rehab setting. Physiatrist is providing close team supervision and 24 hour management of active medical  problems listed below. Physiatrist and rehab team continue to assess barriers to discharge/monitor patient progress toward functional and medical goals  Care Tool:  Bathing    Body parts bathed by patient: Left arm, Right arm, Chest, Abdomen, Right upper leg, Left upper leg, Front perineal area   Body parts bathed by helper: Right arm, Left arm, Front perineal area, Buttocks, Left lower leg, Right lower leg     Bathing assist Assist Level: Total Assistance - Patient < 25%     Upper Body Dressing/Undressing Upper body dressing   What is the patient wearing?: Pull over shirt    Upper body assist Assist Level: Maximal Assistance - Patient 25 - 49%    Lower Body Dressing/Undressing Lower body dressing      What is the patient wearing?: Pants, Underwear/pull up     Lower body assist Assist for lower body dressing: Total Assistance - Patient < 25%     Toileting Toileting Toileting Activity did not occur (Clothing management and hygiene only): N/A (no void or bm)  Toileting assist       Transfers Chair/bed transfer  Transfers assist     Chair/bed transfer assist level:  Maximal Assistance - Patient 25 - 49%     Locomotion Ambulation   Ambulation assist      Assist level: 2 helpers Assistive device: Other (comment) (Hemi-rail) Max distance: 10'   Walk 10 feet activity   Assist     Assist level: 2 helpers Assistive device: Other (comment) (R hemi-rail)   Walk 50 feet activity   Assist Walk 50 feet with 2 turns activity did not occur: Safety/medical concerns (Patient unable to ambulate >10' at this time secondary to increased fatigue with poor endurance/activity tolerance)         Walk 150 feet activity   Assist Walk 150 feet activity did not occur: Safety/medical concerns         Walk 10 feet on uneven surface  activity   Assist Walk 10 feet on uneven surfaces activity did not occur: Safety/medical concerns          Wheelchair     Assist Is the patient using a wheelchair?: Yes Type of Wheelchair: Manual    Wheelchair assist level: Contact Guard/Touching assist Max wheelchair distance: 150'    Wheelchair 50 feet with 2 turns activity    Assist        Assist Level: Contact Guard/Touching assist   Wheelchair 150 feet activity     Assist      Assist Level: Contact Guard/Touching assist   Blood pressure 128/82, pulse 82, temperature (!) 97.5 F (36.4 C), temperature source Oral, resp. rate 16, height 5' 11.5" (1.816 m), weight 94 kg, SpO2 94 %.  Medical Problem List and Plan: 1. Functional deficits secondary to right large MCA infarction due to right M2 occlusion status post IR/revascularization as well as history of CVA 2015 with left-sided residual weakness status post loop recorder             -patient may  shower             -ELOS/Goals: 14-18 days, PT min A, OT min to mod A, SLP supervision             -Admit to CIR PT, OT, SLP evals              -Continue work on visual scanning as he appears to have a visual field cut on the left 2.  Antithrombotics: -DVT/anticoagulation:  Pharmaceutical: Eliquis             -antiplatelet therapy: N/A 3. Pain Management: Tylenol as needed 4. Mood/Behavior/Sleep: Provide emotional support             -antipsychotic agents: N/A 5. Neuropsych/cognition: This patient is capable of making decisions on his own behalf. 6. Skin/Wound Care: Routine skin checks 7. Fluids/Electrolytes/Nutrition: Routine in and outs with follow-up chemistries 8.  Dysphagia.  Dysphagia #2 thin liquids.  Follow-up speech therapy 9.  Hypertension.  Norvasc 10 mg daily, Toprol-XL 25 mg daily.  Monitor with increased mobility.  Overall well-controlled.  Goal BP less than 180/105, long-term goal normotensive Vitals:   05/21/22 0402 05/21/22 0403  BP: (!) 153/80 128/82  Pulse: 81 82  Resp: 16   Temp: (!) 97.5 F (36.4 C)   SpO2: 95% 94%  Controlled 3/3  10.   Atrial fibrillation.  Cardiac rate controlled.  Continue Toprol-xl 25 mg daily On Eliquis  11.  Hyperlipidemia.  Lipitor 20 mg daily 12.  Parotid mass, follow-up outpatient 13.  CKD 3a.  Recheck labs tomorrow morning- creat improved vs prior       Latest Ref Rng &  Units 05/19/2022    8:48 AM 05/18/2022    4:54 AM 05/16/2022    6:25 AM  BMP  Glucose 70 - 99 mg/dL 182  127  118   BUN 8 - 23 mg/dL '23  18  27   '$ Creatinine 0.61 - 1.24 mg/dL 1.14  0.98  1.04   Sodium 135 - 145 mmol/L 134  137  138   Potassium 3.5 - 5.1 mmol/L 3.7  3.8  3.7   Chloride 98 - 111 mmol/L 99  104  107   CO2 22 - 32 mmol/L '25  22  21   '$ Calcium 8.9 - 10.3 mg/dL 9.2  9.1  8.7    14.  Insomnia- d/c trazodone, trial mirtazepine and melatonin LOS: 3 days A FACE TO FACE EVALUATION WAS PERFORMED  Charlett Blake 05/21/2022, 11:20 AM

## 2022-05-22 DIAGNOSIS — G479 Sleep disorder, unspecified: Secondary | ICD-10-CM

## 2022-05-22 MED ORDER — CAMPHOR-MENTHOL 0.5-0.5 % EX LOTN
TOPICAL_LOTION | CUTANEOUS | Status: DC | PRN
  Filled 2022-05-22: qty 222

## 2022-05-22 MED ORDER — MELATONIN 5 MG PO TABS
5.0000 mg | ORAL_TABLET | Freq: Every day | ORAL | Status: DC
  Administered 2022-05-22 – 2022-05-26 (×5): 5 mg via ORAL
  Filled 2022-05-22 (×5): qty 1

## 2022-05-22 NOTE — Progress Notes (Signed)
Patient ID: Edward Maynard, male   DOB: September 07, 1933, 89 y.o.   MRN: [id number] Met with the patient and wife to review current situation, rehab process, team conference and plan of care. Reviewed pain in gums wearing dentures (oral gel requested) but does better with teeth in for D2 thin diet. Reviewed secondary risks including HTN, HLD on Lipitor, prediabetes and A-fib on Eliquis. Reviewed medications and dietary modification recommendations. Noted constipation on medications. Has a rash on back; requested lotion and added hypoallergenic sheets. Asking about resources for home who can help him when wife is not there; forwarded information to SW for private duty caregiver list. Continue to follow along to address educational needs to facilitate preparation for discharge. Margarito Liner

## 2022-05-22 NOTE — Progress Notes (Signed)
PROGRESS NOTE   Subjective/Complaints:  Restless in bed again. Bed not too comfortable. Received remeron and trazodone last night  ROS: Patient denies fever, rash, sore throat, blurred vision, dizziness, nausea, vomiting, diarrhea, cough, shortness of breath or chest pain, joint /neck pain, headache, or mood change.   Objective:   No results found. No results for input(s): "WBC", "HGB", "HCT", "PLT" in the last 72 hours. No results for input(s): "NA", "K", "CL", "CO2", "GLUCOSE", "BUN", "CREATININE", "CALCIUM" in the last 72 hours.  Intake/Output Summary (Last 24 hours) at 05/22/2022 1256 Last data filed at 05/22/2022 0700 Gross per 24 hour  Intake 637 ml  Output 1350 ml  Net -713 ml        Physical Exam: Vital Signs Blood pressure 115/66, pulse 75, temperature 98.6 F (37 C), temperature source Oral, resp. rate 18, height 5' 11.5" (1.816 m), weight 94 kg, SpO2 94 %.   Constitutional: No distress . Vital signs reviewed. HEENT: NCAT, EOMI, oral membranes moist Neck: supple Cardiovascular: RRR without murmur. No JVD    Respiratory/Chest: CTA Bilaterally without wheezes or rales. Normal effort    GI/Abdomen: BS +, non-tender, non-distended Ext: no clubbing, cyanosis, or edema Psych: pleasant and cooperative  Skin: No evidence of breakdown, no evidence of rash Neurologic: Cranial nerves II through XII intact, motor strength is 5/5 in RIght deltoid, bicep, tricep, grip, hip flexor, knee extensors, ankle dorsiflexor and plantar flexor 3- Left delt , bi , tri, 2- finger flexors and extensors  3- Left HF, KE, ADF --stable in appearance Sensory exam absent LT in LUE and LLE, withdraws to pinch LUE  Cerebellar exam deferred on left due to weakness  Musculoskeletal: Full range of motion in all 4 extremities. No joint swelling   Assessment/Plan: 1. Functional deficits which require 3+ hours per day of interdisciplinary therapy  in a comprehensive inpatient rehab setting. Physiatrist is providing close team supervision and 24 hour management of active medical problems listed below. Physiatrist and rehab team continue to assess barriers to discharge/monitor patient progress toward functional and medical goals  Care Tool:  Bathing    Body parts bathed by patient: Left arm, Right arm, Chest, Abdomen, Right upper leg, Left upper leg, Front perineal area   Body parts bathed by helper: Right arm, Left arm, Front perineal area, Buttocks, Left lower leg, Right lower leg     Bathing assist Assist Level: Total Assistance - Patient < 25%     Upper Body Dressing/Undressing Upper body dressing   What is the patient wearing?: Pull over shirt    Upper body assist Assist Level: Maximal Assistance - Patient 25 - 49%    Lower Body Dressing/Undressing Lower body dressing      What is the patient wearing?: Pants, Underwear/pull up     Lower body assist Assist for lower body dressing: Total Assistance - Patient < 25%     Toileting Toileting Toileting Activity did not occur Landscape architect and hygiene only): N/A (no void or bm)  Toileting assist Assist for toileting: 2 Helpers     Transfers Chair/bed transfer  Transfers assist     Chair/bed transfer assist level: Maximal Assistance - Patient 25 -  49%     Locomotion Ambulation   Ambulation assist      Assist level: 2 helpers Assistive device: Other (comment) (Hemi-rail) Max distance: 10'   Walk 10 feet activity   Assist     Assist level: 2 helpers Assistive device: Other (comment) (R hemi-rail)   Walk 50 feet activity   Assist Walk 50 feet with 2 turns activity did not occur: Safety/medical concerns (Patient unable to ambulate >10' at this time secondary to increased fatigue with poor endurance/activity tolerance)         Walk 150 feet activity   Assist Walk 150 feet activity did not occur: Safety/medical concerns          Walk 10 feet on uneven surface  activity   Assist Walk 10 feet on uneven surfaces activity did not occur: Safety/medical concerns         Wheelchair     Assist Is the patient using a wheelchair?: Yes Type of Wheelchair: Manual    Wheelchair assist level: Contact Guard/Touching assist Max wheelchair distance: 150'    Wheelchair 50 feet with 2 turns activity    Assist        Assist Level: Contact Guard/Touching assist   Wheelchair 150 feet activity     Assist      Assist Level: Contact Guard/Touching assist   Blood pressure 115/66, pulse 75, temperature 98.6 F (37 C), temperature source Oral, resp. rate 18, height 5' 11.5" (1.816 m), weight 94 kg, SpO2 94 %.  Medical Problem List and Plan: 1. Functional deficits secondary to right large MCA infarction due to right M2 occlusion status post IR/revascularization as well as history of CVA 2015 with left-sided residual weakness status post loop recorder             -patient may  shower             -ELOS/Goals: 14-18 days, PT min A, OT min to mod A, SLP supervision             -Continue CIR therapies including PT, OT, and SLP               -Continue work on visual scanning as he appears to have a visual field cut on the left 2.  Antithrombotics: -DVT/anticoagulation:  Pharmaceutical: Eliquis             -antiplatelet therapy: N/A 3. Pain Management: Tylenol as needed 4. Mood/Behavior/Sleep: Provide emotional support             -antipsychotic agents: N/A 5. Neuropsych/cognition: This patient is capable of making decisions on his own behalf. 6. Skin/Wound Care: Routine skin checks 7. Fluids/Electrolytes/Nutrition: Routine in and outs with follow-up chemistries 8.  Dysphagia.  Dysphagia #2 thin liquids.  Follow-up speech therapy 9.  Hypertension.  Norvasc 10 mg daily, Toprol-XL 25 mg daily.  Monitor with increased mobility.  Overall well-controlled.  Goal BP less than 180/105, long-term goal  normotensive Vitals:   05/21/22 2030 05/22/22 0354  BP: 114/87 115/66  Pulse: 84 75  Resp: 18 18  Temp: 98 F (36.7 C) 98.6 F (37 C)  SpO2: 95% 94%  Controlled 3/4  10.  Atrial fibrillation.  Cardiac rate controlled.  Continue Toprol-xl 25 mg daily On Eliquis  11.  Hyperlipidemia.  Lipitor 20 mg daily 12.  Parotid mass, follow-up outpatient 13.  CKD 3a.    creat improved vs prior       Latest Ref Rng & Units 05/19/2022  8:48 AM 05/18/2022    4:54 AM 05/16/2022    6:25 AM  BMP  Glucose 70 - 99 mg/dL 182  127  118   BUN 8 - 23 mg/dL '23  18  27   '$ Creatinine 0.61 - 1.24 mg/dL 1.14  0.98  1.04   Sodium 135 - 145 mmol/L 134  137  138   Potassium 3.5 - 5.1 mmol/L 3.7  3.8  3.7   Chloride 98 - 111 mmol/L 99  104  107   CO2 22 - 32 mmol/L '25  22  21   '$ Calcium 8.9 - 10.3 mg/dL 9.2  9.1  8.7    14.  Insomnia- received trazodone,mirtazepine and melatonin last night. Still didn't sleep   -will dc trazodone and adjust melatonin to '5mg'$  LOS: 4 days A FACE TO FACE EVALUATION WAS PERFORMED  Meredith Staggers 05/22/2022, 12:56 PM

## 2022-05-22 NOTE — Progress Notes (Signed)
Speech Language Pathology Daily Session Note  Patient Details  Name: Edward Maynard MRN: [id number] Date of Birth: 1934-03-18  Today's Date: 05/22/2022 SLP Individual Time: [id number] SLP Individual Time Calculation (min): 60 min  Short Term Goals: Week 1: SLP Short Term Goal 1 (Week 1): Patient will consume dysphagia 2 textures and thin liquids with minimal overt s/sx of aspiration and with improved mastication effectiveness with min A verbal cues for implementation of swallowing precautions SLP Short Term Goal 2 (Week 1): Patient will scan left of midline during functional tasks with mod A verbal cues to achieve 50% accuracy SLP Short Term Goal 3 (Week 1): Patient will complete medication management tasks with min-to-mod A verbal cues to achieve 80% accuracy SLP Short Term Goal 4 (Week 1): Patient will complete problem solving tasks with awareness of errors given min-to-mod A verbal cues to ID and repair  Skilled Therapeutic Interventions: Skilled ST treatment focused on cognitive and swallowing goals. Pt was greeted upright in wheelchair on arrival and c/o fatigue. Pt closing eyes throughout session and requiring increased verbal reinforcement for following commands 2/2 sleepiness. Pt c/o pharyngeal retention with liquids. Pt consumed thin liquids by straw with what appeared to be a timely swallow response, without overt s/sx concerning for aspiration, and clear vocal quality post swallow. Pt reported faint improvements with head turn L. No change in symptoms per observation. Pt had some hiccups and burping following consumption of water. Question if he may be swallowing air and/or presenting with GERD/esophageal symptoms. Spouse reported frequent throat clearing at prior level which could align with GERD. Pt was unsure of history. Continue with current diet at this time with implementation of swallowing and general reflux precautions. Pt reported denture adhesive has been helpful to keep dentures in  place with consumption of Dys 2 textures. SLP educated spouse of swallowing + reflux precautions. Spouse verbalized understanding through teach back.   SLP facilitated visual scanning with PEG design task with overall max A multimodal cues. Suspect performance was impacted by drowsiness d/t increased difficulty understanding task concepts and sustained attention. Max A needed for awareness of errors. Pt was returned to room and transferred to bed per request with stedy +2 assist. Pt requiring mod A verbal cues to maintain grasp on Stedy d/t L lean. Patient was left in bed with alarm activated and immediate needs within reach at end of session. Continue per current plan of care.      Pain  None/denied  Therapy/Group: Individual Therapy  Patty Sermons 05/22/2022, 3:27 PM

## 2022-05-22 NOTE — Progress Notes (Signed)
Occupational Therapy Session Note  Patient Details  Name: Edward Maynard MRN: [id number] Date of Birth: [birth year]  Today's Date: 05/22/2022 OT Individual Time: [id number] OT Individual Time Calculation (min): 80 min  OT Individual Time: [id number] OT Individual Time Calculation (min): 42 min   Short Term Goals: Week 1:  OT Short Term Goal 1 (Week 1): Pt will maintain dyanmic sitting balance with CGA during BADLs/functional activities OT Short Term Goal 2 (Week 1): Pt will located 5/5 items on L side of sink/tray/table with min cueing OT Short Term Goal 3 (Week 1): Pt will complete UB dressing min A OT Short Term Goal 4 (Week 1): Pt will self-feed with min A  Skilled Therapeutic Interventions/Progress Updates:     AM Session:  Pt received sitting up in bed presenting to be in good spirits and receptive to skilled OT session. Pt reporting 0/10 pain and receptive to skilled OT session with a focus on BADL retraining, functional transfers, and L sided attention. Pt transitioned to EOB with HOB elevated mod A with max VB and tactile cueing to initiate moving LU/LE. Sitting EOB, Pt able to maintain static sitting balance with with min A without UE support and CGA with RUE positioned on bed rail. Re-educated Pt on impact of CVA on L sided attention and decreased sensation with emphasis on Pt safety. Reviewed hemi-dressing techniques with Pt able to recall dressing L-side first with min questioning cues. Pt doffed shirt EOB with min A and mod cueing to recall technique. Provided Pt shirt and provided education on land marks to support correct orientation of shirt, however Pt requiring max A to orient shirt. Pt weaved LUE into shirt hole with assist to guide arm d/t decreased coordination/ataxia. Max cues required to locate R arm hole with Pt able to bring shirt overhead to weave head into neck hole. Worked on dynamic sitting balance reaching towards ground to weave feet into pants with Pt requiring min-mod  A during task as Pt demonstrated increased challenges with duel tasking. Max A provided to weave feet into pants with Pt assisting during task by holding waist band. +2 present during sit>stand to bring pants to waist d/t Pt presenting with pusher syndrome in standing requiring mod A to maintain balance with RW. Pt completed squat pivot to strong R side with mod A and mod cueing for technique. Pt completed grooming hygiene tasks seated in front of mirror to provide visual feedback to increase midline orientation. Pt able to utilize external cue of mirror to locate and maintain midline with min cueing during tasks, however requiring max cueing to attend to LUE. Toiletry items placed on L side of sink to increase visual scanning/awareness with Pt able to locate 5/5 items with max questioning cues. Pt groomed his hair, cleaned dentures, and brushed his teeth with assistance required to donned toothpaste and HOH provided when utilizing LUE to wash face to ensure proper pressure.   FAST-UL Outcome Measure  Hand-to-mouth (HtM) Movement Starting Position: Participant seated on a standard chair without armrests. Trunk leaning on back support of chair. Both hands placed in pronated position on the ipsilateral middle thigh. Feet placed flat on the floor. If participants have any difficulty in understanding instructions (i.e. aphasia) a visual demonstration is suggested. For each of the 5 tasks of the FAST-UL, the subject at first performs the movement with the less affected UL and then with the affected one. The movement can be repeated 3 times and the best score of the three attempts  is assigned.   Instructions: Each subject is asked to move the hand towards the mouth, touch it with fingertips and return to the thigh. Motor task occurs without moving the trunk off the back support and without moving the head toward the hand.   Scoring: Clinical score from 0 to 3 is provided by comparing affected side with less  affected one as follows: 0 = no movement at all. 1 = The movement task is not completed (less of 50% of the contralateral HtM movement). 2 = The movement task is not completed (more of 50% of the contralateral HtM movement but the mouth is not reached) or the movement task is completed with compensations. If the mouth is touched with the wrist or the palm or the movement is performed with head or trunk compensations (flexion of the head and trunk towards the hand) the score is 2.   3 = movement carried out at 100% of the contralateral HtM movement. HtM occurs with adequate shoulder flexion and abduction, elbow flexion, and forearm supination. The mouth is touched with fingertips.  Patient Score: 3   Reach to Target (RtT) Movement Starting Position: Same starting conditions of HtM movement. Instructions: Each subject is asked to move the hand toward a target (i.e. the hand of the examiner) located in front of the subject in the ipsilateral workspace at shoulder height, at a distance corresponding to 100% of the fully extended UL within arm's reach (less affected arm as reference). Participants have to reach, touch the target, and return. Motor task occurs without moving the trunk off the back support. Scoring: Clinical score from 0 to 3 is provided by comparing affected side with less affected one as follows: 0 = no movement at all. 1 = The movement task is not completed (less of 50% of the contralateral RtT movement).  2 = The movement task is not completed (more of 50% of the contralateral RtT movement but the target is not reached) or the movement task is completed with compensations (i.e. the trunk loses contact with the back support of the chair with forward displacement, shoulder flexion occurs with excessive scapular elevation, or shoulder excessive abduction). If the target is reached with trunk or shoulder compensations for inadequate elbow and finger extension the score is 2.  3 = movement  performed at 100% of the contralateral RtT. The target is reached with adequate shoulder flexion, elbow, wrist and finger extension.  Patient Score: 2   Prono-supination (PS) Movement Starting Position: Same starting conditions of HtM movement. Instructions: Motor task occurs without moving the trunk anteriorly or laterally, the medial side of the humerus is against the body, the forearm is fully pronated with the hand resting on the thigh. Scoring: Clinical score from 0 to 3 is provided by comparing paretic side with less affected one as follows: 0 = no movement at all. 1 = The movement task is not completed (less of 50% of the contralateral PS movement).  2 = The movement task is not completed (more of 50% of the contralateral PS movement but the forearm is not fully supinated) or the movement task is completed with compensations (i.e. excessive trunk inclination, shoulder abduction). If the movement is completed with compensations at elbow, shoulder or trunk level the score is 2. 3 = movement performed at 100% of the contralateral PS (complete supination of the forearm with the dorsal part of the hand in contact with the thigh).   Patient Score: 2   Grasp and Release (  GaR) Movement Starting position: Participant seated on a standard chair. Hip and knees in 90 flexion, feet flat on the floor. Upper limb (UL) resting on a table in front of the participant with approximately 90 elbow flexion, forearm pronated and fingers in a relaxed extended and adducted position.  Instructions: The subject performs a grasping movement of a cylindrical rigid glass (at least 6 cm diameter) placed proximally to an imaginary line connecting the distal joints of thumb and index finger. The subject is asked to grasp the glass, lift it at least 2 cm (elbow remains in contact with the table), and release it. Scoring:  Clinical score from 0 to 3 is provided by comparing affected side with less affected one as  follows: 0 = No movement. The grasp is not possible. 1 = The movement task is not completed (less of 50% of the task). Some prehension is possible but the grasp is not sufficiently stable to lift the object; the grasp can be performed with the use of the less affected hand only to stabilize the glass for inadequate hand/finger opening and the release is not possible. Some hand opening is required otherwise the score is 0. 2 = The movement task is not completed (more of 50% of the task). The object is grasped and lifted but it falls or the task is completed using alternative grasping strategies (i.e. multi-pulpar, palmar, digito palmar; grasping and releasing of the object is possible with abnormal orientation of the wrist and fingers toward the object and the forearm is lifted off the table). 3 = The task is completed using the expected pattern (normal orientation of fingers or wrist toward the object, the grasp occurs with thumb and fingers in opposition, forearm supination, elbow flexion; thumb abduction and finger extension to release the object).  Patient Score: 1   Pinch and Release (PaR) Movement Starting position: Same starting conditions of GaR movement The participant performs a PaR movement of a pen placed on a table in the midline of an imaginary line connecting the distal joints of thumb and index finger. The participants asked to pinch the pen with the tips of thumb and index finger, lift it at least 2 cm (elbow remains in contact with the table), and release it. Clinical score from 0 to 3 is provided by comparing affected side with non-affected one as follows: 0 = No movement. The pinch is not possible. 1 = The movement task is not completed (less of 50% of the task). Some prehension is possible but the pinch is not sufficiently stable to lift the object; the pinch occurs with the use of the less affected hand to stabilize the object for inadequate finger opening and the release is not  possible. Some fingers movement is required otherwise the score is 0. 2 = The movement task is not completed (more of 50% of the task). The object is pinched and lifted but it falls or the task is completed using alternative pinching strategies (e.g. pinching with all the fingers, tripod pinch, pinching and releasing of the object is possible with abnormal orientation of fingers and wrist toward the object and the forearm is lifted off the table). 3 = The task is completed using the expected pattern (normal orientation of fingers or wrist toward the object, the pinch occurs with opposition of pads of index finger and thumb, and wrist extension).  Patient Score: 0  Total score:  8  Pt handed off to PT entering room at end of session with  all needs met.   PM Session:  Pt received sitting up in wc with wife present in room. Pt presenting to be in good spirits, 0/10 pain, and receptive to skilled OT session with a focus on BADL retraining, L sided attention, and L NMR. Pt reporting fatigue at beginning of session, however motivated to participate in session. Pt requesting to use restroom at beginning of session d/t need to void. Pt requesting to use urinal vs. BSC with OT receptive to increase Pt safety. Utilized standing to assist Pt sit>stand with Pt able to pull up in bar using RUE and LUE supported by OT. Total A to doff pants in standing with Pt holding onto grab bar for balance. Pt sat in stedy with OT placing urinal with tactile and VB cues provided to facilitate Pt finding midline and weight shifting to R side. Pt able to maintain seated balance with CGA. Pt provided increased time to urinate, however unable to void at this time. Pt stood in stedy min A with OT bringing pants to waist total A. Pt requested water during session with OT providing water with straw and instructing Pt to take small sips. Pt noted to cough and become teary eyed x1 with Pt reporting he feels like water is getting stuck in  his throat. (Informed SLP of this at end of session to increase pt safety and support inter-professional communication). Pt wife very inquisitive regarding cause of Pt CVA, recovery process, current status, and expected outcomes. Provided Pt and Pt wife education on Pt CVA with wife receptive and provided Pt wife binder with handouts to reference as need for additional information. Informed Pt wife she can speak with any medial team member to discuss questions and she is welcome to participate in therapy sessions as able. Remainder of session focused on LUE NMR to improve functional grasp/release and hand to mouth movements. Pt completed 5 trials of grasp on release of wash cloth placed on tray table to Pt L to increase awareness to L side. OT providing max VB and tactile cues with tapping to facilitate Pt attending to hand with Pt able to grasp wash cloth and transport it from tray table<>mouth with maximal effort given. Pt wife noted to be excited and motivated by Pt progress demonstrating good support system for Pt. Pt left resting in wc with wife present in room and SLP entering room upon OT departure with all immediate needs met.   Therapy Documentation Precautions:  Precautions Precautions: Fall Precaution Comments: Left hemi; Left homonymous hemianopsia Restrictions Weight Bearing Restrictions: No General:   ADL: ADL Eating: Moderate assistance Where Assessed-Eating: Bed level Grooming: Moderate assistance Where Assessed-Grooming: Edge of bed Upper Body Bathing: Moderate assistance, Maximal assistance Where Assessed-Upper Body Bathing: Edge of bed Lower Body Bathing: Maximal assistance Where Assessed-Lower Body Bathing: Edge of bed Upper Body Dressing: Moderate assistance, Maximal assistance Where Assessed-Upper Body Dressing: Edge of bed Lower Body Dressing: Maximal assistance Where Assessed-Lower Body Dressing: Edge of bed Toileting: Dependent, Maximal assistance Where  Assessed-Toileting: Bed level Toilet Transfer: Dependent, Maximal assistance (stedy, able to pull self to standing; not safety to atempt squat pivot or ambulate to bathroom) Toilet Transfer Equipment: Bedside commode Tub/Shower Transfer: Not assessed Social research officer, government: Dependent, Maximal assistance (stedy) Social research officer, government Method: Other (comment) (stedy) Walk-In Shower Equipment: Radio broadcast assistant   Therapy/Group: Individual Therapy  Janey Genta 05/22/2022, 8:01 AM

## 2022-05-22 NOTE — Progress Notes (Signed)
Physical Therapy Session Note  Patient Details  Name: Edward Maynard MRN: [id number] Date of Birth: 07/20/1933  Today's Date: 05/22/2022 PT Individual Time: [id number] PT Individual Time Calculation (min): 42 min   Short Term Goals: Week 1:  PT Short Term Goal 1 (Week 1): Patient will perform bed mobility with MinA PT Short Term Goal 2 (Week 1): Patient will perform bed/chair transfer with LRAD and ModA PT Short Term Goal 3 (Week 1): Patient will ambulate 110' with LRAD and MaxA x1  Skilled Therapeutic Interventions/Progress Updates:  Patient seated upright in w/c on entrance to room. Completing session with OT. Patient alert and agreeable to PT session.   Patient with no pain complaint at start of session.  Pt does demonstrate poor L sided awareness throughout along with R gaze preference/ head turn.  Therapeutic Activity: Transfers: Pt performed blocked practice of sit<>stand transfers with Mod/ MaxA. Provided verbal cues for foot positioning, forward scoot, forward lean, pushing from w/c armrest, and attaining upright stance prior to progressing hand position.  Gait Training:  Pt ambulated 25 ft using R hallway HR with Mod/ Max A and improving to [place]. Demonstrated reduced attention to LLE and required physical facilitation of lateral weight shift with minimal automatic response from pt. Required vc/ tc for R/ L progression.  Neuromuscular Re-ed: NMR facilitated during session with focus on standing balance, motor control. Pt guided in sit<>stand technique requiring anterior lean for appropriate anterior weight shift. During session, 3 standing bouts performed with first bout focused on midline orientation and maintenance  NMR performed for improvements in motor control and coordination, balance, sequencing, judgement, and self confidence/ efficacy in performing all aspects of mobility at highest level of independence.   Patient *** at end of session with brakes locked, *** alarm set,  and all needs within reach.   Therapy Documentation Precautions:  Precautions Precautions: Fall Precaution Comments: Left hemi; Left homonymous hemianopsia Restrictions Weight Bearing Restrictions: No General:   Vital Signs:   Pain:    Therapy/Group: Individual Therapy  Alger Simons PT, DPT, CSRS 05/22/2022, 12:38 PM

## 2022-05-23 NOTE — Progress Notes (Signed)
Occupational Therapy Session Note  Patient Details  Name: Edward Maynard MRN: [id number] Date of Birth: Mar 26, 1933  Today's Date: 05/23/2022 OT Individual Time: [id number] OT Individual Time Calculation (min): 45 min    Short Term Goals: Week 1:  OT Short Term Goal 1 (Week 1): Pt will maintain dyanmic sitting balance with CGA during BADLs/functional activities OT Short Term Goal 2 (Week 1): Pt will located 5/5 items on L side of sink/tray/table with min cueing OT Short Term Goal 3 (Week 1): Pt will complete UB dressing min A OT Short Term Goal 4 (Week 1): Pt will self-feed with min A  Skilled Therapeutic Interventions/Progress Updates:     Pt received sitting up in wc sleeping easy to wake. Pt greeted and receptive to skilled OT session reporting fatigue, however 0/10 pain. Pt transported total A to therapy gym in wc for time management and energy conservation. Positioned Pt wc beside EOM and provided verbal instruction and demonstration of squat pivot transfer. Pt completed squat pivot to R strong side with max A with pt able to initiate reach to R and lifting hips with OT facilitating increased Wb'ing in BLEs. +2 present during transfer to assist with guiding Pt hips and stabilizing wc to increase safety. Pt able to maintain static sitting balance EOM ~1 minutes, however when instructed on visual scanning to L, Pt falling posteriorly requiring max to lift trunk and maintain sitting balance. OT provided tactile and visual cues for Pt to locate midline with Pt able to maintain sitting balance with CGA and intermittent min A. OT facilitated pt participating in bean bag transportation task to facilitate increasing Pt attention to L visual field, Wb'ing through LUE, and address Pt dynamic sitting balance deficits. Pt able to turn head to L to locate bean bag x8 trials, lower onto LUE, and push through LUE and engage trunk muscles to lift trunk to center. Pt requiring overall mod A to complete task with  max VB and tactile cues provided. Intermittent rest breaks provided d/t Pt fatigue. Pt completed squat pivot transfer back to wc to R side max A with mod cuing provided with +2 present to guide hips and stabilize wc. Pt transported back to room total A in wc for energy conservation. Pt requested to brush teeth at end of session. Placed in front of sink for visual feedback to locate midline with pt requiring min A to lift trunk with no awareness of LUE placement requiring max cueing to place LUE on sink. Pt able to locate tooth brush on L side of sink with min directional cues. Assisted Pt with donning toothpaste with Pt them able to utilize RUE to brush teeth and facilitated Pt bringing trunk forward to spit. Pt completed squat pivot back to bed with max A with +2 assisting with guiding hips and stabilizing wc. Pt able to follow VB cues for log rolling technique requiring max A to lift lefts into bed. Pt left resting in bed with call bell in reach, bed alarm on, and all needs met.   Therapy Documentation Precautions:  Precautions Precautions: Fall Precaution Comments: Left hemi; Left homonymous hemianopsia Restrictions Weight Bearing Restrictions: No General:   Vital Signs: Therapy Vitals Temp: 98.3 F (36.8 C) Pulse Rate: 78 Resp: 18 BP: 116/75 Patient Position (if appropriate): Lying Oxygen Therapy SpO2: 91 % O2 Device: Room Air Pain:   ADL: ADL Eating: Moderate assistance Where Assessed-Eating: Bed level Grooming: Moderate assistance Where Assessed-Grooming: Edge of bed Upper Body Bathing: Moderate assistance, Maximal  assistance Where Assessed-Upper Body Bathing: Edge of bed Lower Body Bathing: Maximal assistance Where Assessed-Lower Body Bathing: Edge of bed Upper Body Dressing: Moderate assistance, Maximal assistance Where Assessed-Upper Body Dressing: Edge of bed Lower Body Dressing: Maximal assistance Where Assessed-Lower Body Dressing: Edge of bed Toileting: Dependent,  Maximal assistance Where Assessed-Toileting: Bed level Toilet Transfer: Dependent, Maximal assistance (stedy, able to pull self to standing; not safety to atempt squat pivot or ambulate to bathroom) Toilet Transfer Equipment: Bedside commode Tub/Shower Transfer: Not assessed Social research officer, government: Dependent, Maximal assistance (stedy) Social research officer, government Method: Other (comment) (stedy) Walk-In Shower Equipment: Radio broadcast assistant   Therapy/Group: Individual Therapy  Janey Genta 05/23/2022, 8:05 AM

## 2022-05-23 NOTE — Progress Notes (Signed)
PROGRESS NOTE   Subjective/Complaints:  Emotionally labile, crying,  when discussing how proud he is of children and grandchildren  ROS: Patient denies CP, SOB, N/V/D  Objective:   No results found. No results for input(s): "WBC", "HGB", "HCT", "PLT" in the last 72 hours. No results for input(s): "NA", "K", "CL", "CO2", "GLUCOSE", "BUN", "CREATININE", "CALCIUM" in the last 72 hours.  Intake/Output Summary (Last 24 hours) at 05/23/2022 0752 Last data filed at 05/23/2022 0443 Gross per 24 hour  Intake 640 ml  Output 1050 ml  Net -410 ml         Physical Exam: Vital Signs Blood pressure 116/75, pulse 78, temperature 98.3 F (36.8 C), resp. rate 18, height 5' 11.5" (1.816 m), weight 94 kg, SpO2 91 %.    General: No acute distress Mood and affect are appropriate Heart: Regular rate and rhythm no rubs murmurs or extra sounds Lungs: Clear to auscultation, breathing unlabored, no rales or wheezes Abdomen: Positive bowel sounds, soft nontender to palpation, nondistended Extremities: No clubbing, cyanosis, or edema Skin: No evidence of breakdown, no evidence of rash  Neurologic: Cranial nerves II through XII intact, motor strength is 5/5 in RIght deltoid, bicep, tricep, grip, hip flexor, knee extensors, ankle dorsiflexor and plantar flexor 3- Left delt , bi , tri, 2- finger flexors and extensors  3- Left HF, KE, ADF --stable in appearance Sensory exam absent LT in LUE and LLE, withdraws to pinch LUE  Cerebellar exam deferred on left due to weakness  Musculoskeletal: Full range of motion in all 4 extremities. No joint swelling   Assessment/Plan: 1. Functional deficits which require 3+ hours per day of interdisciplinary therapy in a comprehensive inpatient rehab setting. Physiatrist is providing close team supervision and 24 hour management of active medical problems listed below. Physiatrist and rehab team continue to  assess barriers to discharge/monitor patient progress toward functional and medical goals  Care Tool:  Bathing    Body parts bathed by patient: Left arm, Right arm, Chest, Abdomen, Right upper leg, Left upper leg, Front perineal area   Body parts bathed by helper: Right arm, Left arm, Front perineal area, Buttocks, Left lower leg, Right lower leg     Bathing assist Assist Level: Total Assistance - Patient < 25%     Upper Body Dressing/Undressing Upper body dressing   What is the patient wearing?: Pull over shirt    Upper body assist Assist Level: Maximal Assistance - Patient 25 - 49%    Lower Body Dressing/Undressing Lower body dressing      What is the patient wearing?: Pants, Underwear/pull up     Lower body assist Assist for lower body dressing: Total Assistance - Patient < 25%     Toileting Toileting Toileting Activity did not occur Landscape architect and hygiene only): N/A (no void or bm)  Toileting assist Assist for toileting: 2 Helpers     Transfers Chair/bed transfer  Transfers assist     Chair/bed transfer assist level: Maximal Assistance - Patient 25 - 49%     Locomotion Ambulation   Ambulation assist      Assist level: 2 helpers Assistive device: Other (comment) (Hemi-rail) Max distance: 37'  Walk 10 feet activity   Assist     Assist level: 2 helpers Assistive device: Other (comment) (R hemi-rail)   Walk 50 feet activity   Assist Walk 50 feet with 2 turns activity did not occur: Safety/medical concerns (Patient unable to ambulate >10' at this time secondary to increased fatigue with poor endurance/activity tolerance)         Walk 150 feet activity   Assist Walk 150 feet activity did not occur: Safety/medical concerns         Walk 10 feet on uneven surface  activity   Assist Walk 10 feet on uneven surfaces activity did not occur: Safety/medical concerns         Wheelchair     Assist Is the patient using a  wheelchair?: Yes Type of Wheelchair: Manual    Wheelchair assist level: Contact Guard/Touching assist Max wheelchair distance: 150'    Wheelchair 50 feet with 2 turns activity    Assist        Assist Level: Contact Guard/Touching assist   Wheelchair 150 feet activity     Assist      Assist Level: Contact Guard/Touching assist   Blood pressure 116/75, pulse 78, temperature 98.3 F (36.8 C), resp. rate 18, height 5' 11.5" (1.816 m), weight 94 kg, SpO2 91 %.  Medical Problem List and Plan: 1. Functional deficits secondary to right large MCA infarction due to right M2 occlusion status post IR/revascularization as well as history of CVA 2015 with left-sided residual weakness status post loop recorder             -patient may  shower             -ELOS/Goals: 14-18 days, PT min A, OT min to mod A, SLP supervision             -Continue CIR therapies including PT, OT, and SLP  - team conf in am              -Continue work on visual scanning as he appears to have a visual field cut on the left 2.  Antithrombotics: -DVT/anticoagulation:  Pharmaceutical: Eliquis             -antiplatelet therapy: N/A 3. Pain Management: Tylenol as needed 4. Mood/Behavior/Sleep: Provide emotional support             -antipsychotic agents: N/A 5. Neuropsych/cognition: This patient is capable of making decisions on his own behalf. 6. Skin/Wound Care: Routine skin checks 7. Fluids/Electrolytes/Nutrition: Routine in and outs with follow-up chemistries 8.  Dysphagia.  Dysphagia #2 thin liquids.  Follow-up speech therapy 9.  Hypertension.  Norvasc 10 mg daily, Toprol-XL 25 mg daily.  Monitor with increased mobility.  Overall well-controlled.  Goal BP less than 180/105, long-term goal normotensive Vitals:   05/22/22 2358 05/23/22 0429  BP: (!) 119/57 116/75  Pulse: 70 78  Resp: 18 18  Temp: 98.3 F (36.8 C) 98.3 F (36.8 C)  SpO2: 91% 91%  Controlled 3/5  10.  Atrial fibrillation.  Cardiac  rate controlled.  Continue Toprol-xl 25 mg daily On Eliquis  11.  Hyperlipidemia.  Lipitor 20 mg daily 12.  Parotid mass, follow-up outpatient 13.  CKD 3a.    creat improved vs prior       Latest Ref Rng & Units 05/19/2022    8:48 AM 05/18/2022    4:54 AM 05/16/2022    6:25 AM  BMP  Glucose 70 - 99 mg/dL 182  127  118  BUN 8 - 23 mg/dL '23  18  27   '$ Creatinine 0.61 - 1.24 mg/dL 1.14  0.98  1.04   Sodium 135 - 145 mmol/L 134  137  138   Potassium 3.5 - 5.1 mmol/L 3.7  3.8  3.7   Chloride 98 - 111 mmol/L 99  104  107   CO2 22 - 32 mmol/L '25  22  21   '$ Calcium 8.9 - 10.3 mg/dL 9.2  9.1  8.7    14.  Insomnia- received trazodone,mirtazepine and melatonin last night. Still didn't sleep   -will dc trazodone and adjust melatonin to '5mg'$  LOS: 5 days A FACE TO FACE EVALUATION WAS PERFORMED  Charlett Blake 05/23/2022, 7:52 AM

## 2022-05-23 NOTE — Progress Notes (Signed)
Initial Nutrition Assessment  DOCUMENTATION CODES:   Not applicable  INTERVENTION:  - Continue Ensure Enlive po TID, each supplement provides 350 kcal and 20 grams of protein.  NUTRITION DIAGNOSIS:  No nutrition diagnosis at this time.   REASON FOR ASSESSMENT:   Malnutrition Screening Tool   ASSESSMENT:   89 y.o. male admits to CIR related to functional deficits in setting of right middle cerebral artery stroke. PMH includes: high cholesterol and HLD.  Meds reviewed: lipitor, colace, melatonin, remeron. Labs reviewed: Na low.   The pt reports that he has a good appetite and has been eating well since admission. He denies any wt loss. PO intakes vary from 25-85% over the past 4 days. The pt also reports that he is drinking his Ensure shakes. Pt is likely meeting his needs with PO and supplement intakes. RD will continue to monitor PO intakes.   NUTRITION - FOCUSED PHYSICAL EXAM:  Flowsheet Row Most Recent Value  Orbital Region No depletion  Upper Arm Region No depletion  Thoracic and Lumbar Region No depletion  Buccal Region No depletion  Temple Region Mild depletion  Clavicle Bone Region No depletion  Clavicle and Acromion Bone Region No depletion  Scapular Bone Region No depletion  Dorsal Hand No depletion  Patellar Region No depletion  Anterior Thigh Region No depletion  Posterior Calf Region No depletion  Edema (RD Assessment) None  Hair Reviewed  Eyes Reviewed  Mouth Reviewed  Skin Reviewed  Nails Reviewed       Diet Order:   Diet Order             DIET DYS 3 Room service appropriate? Yes; Fluid consistency: Thin  Diet effective now                   EDUCATION NEEDS:      Skin:  Skin Assessment: Reviewed RN Assessment  Last BM:  05/20/22  Height:   Ht Readings from Last 1 Encounters:  05/18/22 5' 11.5" (1.816 m)    Weight:   Wt Readings from Last 1 Encounters:  05/18/22 94 kg    Ideal Body Weight:     BMI:  Body mass index is 28.5  kg/m.  Estimated Nutritional Needs:   Kcal:  [id number] kcals  Protein:  115-140 gm  Fluid:  >/= 2.3 L  Thalia Bloodgood, RD, LDN, CNSC.

## 2022-05-23 NOTE — Progress Notes (Addendum)
Speech Language Pathology Daily Session Note  Patient Details  Name: Edward Maynard MRN: [id number] Date of Birth: 1933/09/26  Today's Date: 05/23/2022 SLP Individual Time: 0800-0900 SLP Individual Time Calculation (min): 60 min  Short Term Goals: Week 1: SLP Short Term Goal 1 (Week 1): Patient will consume dysphagia 2 textures and thin liquids with minimal overt s/sx of aspiration and with improved mastication effectiveness with min A verbal cues for implementation of swallowing precautions SLP Short Term Goal 2 (Week 1): Patient will scan left of midline during functional tasks with mod A verbal cues to achieve 50% accuracy SLP Short Term Goal 3 (Week 1): Patient will complete medication management tasks with min-to-mod A verbal cues to achieve 80% accuracy SLP Short Term Goal 4 (Week 1): Patient will complete problem solving tasks with awareness of errors given min-to-mod A verbal cues to ID and repair  Skilled Therapeutic Interventions: Skilled ST treatment focused on cognitive and swallowing goals. Pt seen following breakfast. Pt denied chewing/swallowing difficulty or c/o globus sensation as reported yesterday. Suspect fatigue may play a role in swallow, as he was very fatigued yesterday afternoon during session. Pt consumed whole pills in applesauce with complete and timely oral clearance and without overt s/sx of aspiration. With dentures in place with adhesive, pt agreeable to dys 3 PO trials with mildly prolonged yet effective mastication, mild oral residuals in L buccal cavity, and without overt s/sx of aspiration. Pt exhibited some burping followed by throat clearing approximately 2-3 minutes following solid trials. Symptoms appear esophageal in nature. Question GERD. Pt denies history of reflux. Will discuss with MD. Recommend diet advancement to dysphagia 3 textures and thin liquids. Cont full supervision for safety and assist with self feeding. Pt in agreement. SLP facilitated visual  scanning to locate call bell feature on bed on L hand side, as well as reading clock with mod-to-max A verbal cues to scan left of midline. With clock, pt expressed "it just looks like a bunch of numbers." Patient was left in bed with alarm activated and immediate needs within reach at end of session. Continue per current plan of care.      Pain Pain Assessment Pain Scale: 0-10 Pain Score: 0-No pain  Therapy/Group: Individual Therapy  Patty Sermons 05/23/2022, 8:26 AM

## 2022-05-23 NOTE — Progress Notes (Signed)
Physical Therapy Session Note  Patient Details  Name: Edward Maynard MRN: [id number] Date of Birth: 1934/02/24  Today's Date: 05/23/2022 PT Individual Time: 0907-1002 and 1405-1501 PT Individual Time Calculation (min): 55 min and 56 min  Short Term Goals: Week 1:  PT Short Term Goal 1 (Week 1): Patient will perform bed mobility with MinA PT Short Term Goal 2 (Week 1): Patient will perform bed/chair transfer with LRAD and ModA PT Short Term Goal 3 (Week 1): Patient will ambulate 4' with LRAD and MaxA x1  Skilled Therapeutic Interventions/Progress Updates:    Session 1: Pt received awake supported, upright in bed and agreeable to therapy session. Supine>sitting R EOB, HOB maximally elevated and using bedrail, with max assist for L hemibody management and bringing trunk upright due to L inattention and impaired motor planning. Sitting EOB, with pt relying on R UE support on bedrail to maintain trunk control due to L lateral lean and posterior lean biases - max verbal cuing for safety throughout and awareness of leaning outside BOS. Threaded on pants and donned shoes total assist.  Sit>stand EOB>R UE support on bedrail x2 with mod/max assist for balance and lifting to stand due to L lean bias with pt lacking consistent L quad activation (not true buckling, just lack of sustained extension) - pulled pants up over hips dependently - required 2 stands to complete this due to impaired balance and pt returning to sitting on EOB. Pt with impaired midline orientation with pt stating he feels like he is falling backwards and frequently leaning towards L.  L squat pivot EOB>w/c with max assist of 1 for lifting and pivoting hips with +2 assist for safety to stabilize w/c - pt with significant motor planning deficits when transferring towards L requiring total cuing to sequence task of head/hips relationship - max/total cuing to let go of bedrail with R hand due to pt trying to pull himself in that  direction.  *Donned L LE leg loop to assist with L LE swing phase advancement, donned L LE DF assist wrap on 2nd gait trial to assist with ankle positioning (pt powering through the ACE wrap).  Gait training ~63f x2 using 3 Musketeer support with +2 mod assist for balance and L LE management, able to turn and sit on mat at end of 1st walk but transitioned to having 3rd person w/c follow during 2nd walk. Pt demonstrating the following gait deviations with therapist providing the described cuing and facilitation for improvement:  - lacks R weight shift onto R LE stance limb with persistent L lean - advances L LE but with excessive adduction and ankle inversion bias and pt kicks it forward without bringing his hips forward to put weight down through that LE, placing his ankle at risk for rolling - repeated cuing to maintain upright posture as pt starts to flex forward - takes a few steps forward with R LE to have positive step length and allow improved L LE step length  Block practice squat pivot transfer w/c>EOM towards L targeting improving motor planning and sequencing this direction - max verbal/visual cuing to locate mat on L side and sequence the transfer - improves to requiring heavy mod/light max assist for lifting/pivoting hips and +2 present for safety. R squat pivot EOM>w/c with min/mod assist in this direction and pt primarily scooting hips.  Transported back to room and pt left seated in w/c with needs in reach and seat belt alarm on - nurse aware of pt's position.  Session 2: Pt received supine in bed asleep with his wife, [name], present and pt agreeable to therapy session. Pt's wife expresses concern that pt needs to rest during the day because he was irritable from exhaustion upon her arrival - notified family they do not have to be present during the day if pt needs to rest.  Supine>sitting R EOB, HOB elevated and using bedrail, with heavy mod assist for L hemibody management, trunk  control, and pivoting hips. Pt continues to have impaired L attention as well as lack of proprioceptive awareness of L UE placement frequently placing his wrist in an hyperflexed state.  Sitting EOB, using R UE support on bedrail as needed, with supervision and verbal cuing for safe trunk control while donning tennis shoes total assist.  L squat pivot transfer EOB>w/c with heavy max assist and significantly increased +++time to motor plan and sequence this task with multiple trials for safety due to pt having significant difficulty transferring towards L side due to inattention - max cuing for head/hips relationship and lifting hips.  Transported to/from gym in w/c for time management and energy conservation.  Sit<>stands w/c<>R UE support on litegait handle with heavy mod assist of 1 while blocking/guarding L knee to promote increased extension and maintain balance due to L lean bias. With +2 assist for donning litegait harness in standing.   Pt requires seated rest break after donning harness.  Donned L LE leg loop to use during swing phase facilitation to protect his ankle due to inversion rolling risk.  Gait training ~11f using litegait harness for partial BWS with R UE support on litegait (will need ACE wrap to support L hand on handle) with +2 assist for litegait management and therapist facilitating the following: - continues to lack full R weight shift onto R stance limb to allow adequate L swing phase advancement, therapist manually facilitating improvement - continues to kick L LE forward in excessive hip/knee flexed and adducted position with ankle inversion (flexor synergy pattern) - therapist manually facilitating placement using leg loop as needed and cuing for pt to put his foot on the ground with therapist facilitating knee extension - during L stance pt tries to "hop" over L LE rather than having full stance phase, lacks activation of L knee extensors keeping it partially flexed  throughout stance with lack of forward pelvic translation over L stance, attempted to use grey hip bungee cord to improve but no change  Doffed harness as described above.  Transported back to his room.   R squat pivot w/c>EOB with mod assist as pt demos significant improvement in motor planning this direction - continues to scoot hips rather than lifting and pivoting in 1 large movement.  Sit>supine with light mod assist via reverse logroll technique. Pt left supine in bed with needs in reach, bed alarm on, and pt's wife present. Pt's wife reports their daughter, WAbigail Butts would like an update.   Therapist called pt's daughter WAbigail Buttsand discussed pt's CLOF, ELOS, and goals for D/C as well as pt's significant impairments of L inattention and apraxia - therapist discussed pt's likely need of a wheelchair and ramped entrance into the home. Educated pt's daughter that can arrange for pt to have PT session when she can attend to observe pt's CLOF.    Therapy Documentation Precautions:  Precautions Precautions: Fall Precaution Comments: Left hemi; Left homonymous hemianopsia Restrictions Weight Bearing Restrictions: No   Pain:  Session 1: Reports some R shoulder pain with gait training -  transitioned to different mobility task for pain management.  Session 2: Pt continues to report R arm becomes sore/fatigued with gait training as pt relies/compensates heavily with it to facilitate a weight shift - provided rest break for pain management. Pt also reports some pain in L LE during gait but pt with difficulty further explaining.   Therapy/Group: Individual Therapy  Tawana Scale , PT, DPT, NCS, CSRS 05/23/2022, 7:55 AM

## 2022-05-23 NOTE — Progress Notes (Signed)
Speech Language Pathology Weekly Progress and Session Note  Patient Details  Name: TAUNO VINCELETTE MRN: [id number] Date of Birth: 1934/03/04  Beginning of progress report period: May 19, 2022 End of progress report period: March {NUMBERS 1-31 (DATE):31396}, 2024  {chl ip rehab slp time calculations:304100500}  Short Term Goals: Week 1: SLP Short Term Goal 1 (Week 1): Patient will consume dysphagia 2 textures and thin liquids with minimal overt s/sx of aspiration and with improved mastication effectiveness with min A verbal cues for implementation of swallowing precautions SLP Short Term Goal 2 (Week 1): Patient will scan left of midline during functional tasks with mod A verbal cues to achieve 50% accuracy SLP Short Term Goal 3 (Week 1): Patient will complete medication management tasks with min-to-mod A verbal cues to achieve 80% accuracy SLP Short Term Goal 4 (Week 1): Patient will complete problem solving tasks with awareness of errors given min-to-mod A verbal cues to ID and repair    New Short Term Goals: [id number]  Weekly Progress Updates: Patient has made *** gains and has met *** of *** STGs this reporting period. Patient is currently completing cognitive tasks with overall *** A *** cues to complete functional and complex tasks accurately and safely. Pt progressed to a dysphagia 3 diet with thin liquids with sup A verbal cues for use of swallowing safety strategies including L lingual sweep and small bites/sips. Patient and family education is ongoing. Patient would benefit from continued skilled SLP intervention to maximize cognitive functioning, swallowing, and overall functional independence prior to discharge.   Intensity:   Frequency:   Duration/Length of Stay:   Treatment/Interventions:     Daily Session Skilled Therapeutic Interventions: ***     General    Pain    Therapy/Group: {Therapy/Group:[id number]}  Bernardina Cacho T Rayan Ines 05/23/2022, 7:33 PM

## 2022-05-24 ENCOUNTER — Encounter (HOSPITAL_COMMUNITY): Payer: Self-pay | Admitting: Physical Medicine & Rehabilitation

## 2022-05-24 NOTE — Progress Notes (Signed)
Physical Therapy Session Note  Patient Details  Name: Edward Maynard MRN: [id number] Date of Birth: 1933-05-13  Today's Date: 05/24/2022 PT Individual Time: [id number] and [id number] PT Individual Time Calculation (min): 61 min and 72 min   Short Term Goals: Week 1:  PT Short Term Goal 1 (Week 1): Patient will perform bed mobility with MinA PT Short Term Goal 2 (Week 1): Patient will perform bed/chair transfer with LRAD and ModA PT Short Term Goal 3 (Week 1): Patient will ambulate 25' with LRAD and MaxA x1  Skilled Therapeutic Interventions/Progress Updates:    Session 1: Pt received supine in bed awake and agreeable to therapy session. Supine>sitting L EOB, HOB elevated, with mod assist and total step-by-step cuing for sequencing, L hemibody attention, and trunk control. Sitting EOB using R UE support on bedrail as needed while therapist donned TED hose, threaded on pants, and donned shoes total assist. Sitting EOB with up to mod assist due to L posterior lean while donning shirt total assist. Sit>stand EOB>R UE support on R bedrail with heavy mod assist for balance due to L anterior lean bias and pulled pants up over hips dependently.  L squat pivot transfer EOB>w/c with heavy mod/max assist for lifting/pivoting hips - continues to require total step-by-step verbal/visual cuing for sequencing head/hips relationship, increased anterior trunk lean, and timing his movement with assistance from therapist in order to lift his hips sufficiently otherwise pt attempts small scoots and causes wheelchair to slide out from under him. Pt is slowly improving in his motor planning in this direction.   Transported to/from gym in w/c for time management and energy conservation.  Pt demos no light touch, inconsistent deep pressure, and no proprioception in his L UE and LE - contributing to his impaired coordination of movement and inattention.  Sit>stand w/c>3 Musketeer support with +2 mod assist.  Donned L LE  leg loop to assist with facilitating L LE swing phase and placement.  Gait training ~79f using 3 Musketeer support with +2 mod/max assist for balance and L LE management. Pt demonstrating the following gait deviations with therapist providing the described cuing and facilitation for improvement:  - continues to require repeated cuing to maintain upright posture as pt starts to flex forward at hips and trunk with strong L lean bias - requires total step-by-step cuing for sequencing weight shifting, upright posture, and stepping LEs - frequently takes multiple short steps with R LE to achieve reciprocal step length - poor R weight shift onto R stance limb due to strong L lean and impaired midline orientation - able to advance L LE during swing but in a flexor synergy pattern with risk for ankle inversion rolling requiring max verbal cuing to put his LE down and therapist facilitating knee extension to help place his foot on the ground safely -  during L stance pt stays in excessive forward trunk flexed posture with lack of hip/trunk extension requiring max/total assist to block L knee during stance to facilitating increased extension for upright posture Able to turn and sit on mat at end of gait trial with heavy +2 max assist for safety Pt continues to report R UE muscle pain with gait due to relying heavily on that UE to assist with him weight shifting due to impaired motor planning and sensation to utilize his LE movements to achieve this.  Standing NMR targeting improved midline orientation with mirror feedback and +1-2 mod/max assist to maintain balance with focus on pt activating L hip/knee extension  to facilitate R weight shift to touch his R shoulder to +2 assist's shoulder on his R with cuing to maintain upright posture. Pt does best with external targets but has difficulty sustaining the weight shift with quick regression back to strong L lateral lean.  L squat pivot EOM>w/c with therapist  continuing to slow down and break down the step-by-step sequencing for the task with cuing for R anterior trunk lean and pt pushing up through his LEs to lift his hips while allowing therapist to manually facilitation his hips pivoting L into the w/c - only requires heavy mod assist this time with much safer movement!  Transported back to room and pt left seated in w/c with needs in reach, L UE supported on 1/2 lap tray, friends in room visiting, and seat belt alarm on. Nursing staff notified to assist pt back to bed after ~1hr to rest in preparation for afternoon therapy sessions.   Session 2: Pt received supine in bed and agreeable to therapy session. Supine>sitting R EOB, HOB elevated, with mod assist and total step-by-step cuing for sequencing, L hemibody attention, and trunk control - had 2x posterior trunk LOB while trying to move hips over to EOB. Sitting EOB, without UE support, and minor posterior LOB but able to correct with verbal cuing while donning shoes total assist.  L squat pivot transfer EOB>w/c with continued max/total step-by-step cuing for sequencing of head/hips relationship, pushing through B LEs to lift hips, and therapist primarily helping with pivoting hips, balance, and lifting - heavy mod assist while blocking/guarding L knee.   Transported to/from gym in w/c for time management and energy conservation --- selected ortho gym for quiet environment to improve pt focus.  L squat pivot w/c>EOM with continued heavy mod assist and cuing as just described to work on carryover with pt continuing to show slow, but consistent improvement.  Sit>stand from EOM with +2 on R side for safety and heavy mod assist of 1 for lifting to stand and balance due to strong L lean - mirror feedback with pt continuing to benefit primarily from external target of +2 on his R side to shift his weight in that direction and put his shoulder to their shoulder - pt with improving awareness of when he is  leaning outside midline and initiating balance recovery strategy without using R UE to pull himself back towards midline.  Upon returning to sitting on EOM pt noted to have persistent, strong R horizontal beating nystagmus at rest - pt denies even being able to notice a change in his vision during this and no other changes in his status are noted - nystagmus lasted ~1-18mnutes - retrieved Dan, PUtahhowever the symptoms had dissipated upon PA arrival - no restrictions for therapy. Will continue to monitor if this occurs again during session and it does not.  Donned L LE leg loop to assist with swing phase advancement and foot placement during gait training - pt still with flexor synergy pattern during swing placing him at risk for ankle inversion rolling.  Gait training ~24fx2 using +2 R HHA and L UE support around thearpist with +2 mod/max assist for balance. Pt demonstrating the following gait deviations with therapist providing the described cuing and facilitation for improvement:  - does better with +2 HHA as they can detect when he is pulling and can help know when to cue pt to weight shift rather than compensating by pulling with his arm - continued step-by-step cuing for sequencing of  R/L weight shift onto stance limb with pt having great improvement in his motor planning of how to do this although still with frequent strong L lean - step-by-step cuing for swing limb advancement with therapist still facilitating L LE step and foot positioning due to flexor synergy pattern with risk of ankle inversion rolling - blocking L knee during stance for safety and to facilitate increased L knee extension during L weight shift  - continued cuing for upright posture with pt demoing improvement in this area *pt able to turn and sit on mat at end of each gait cycle with +2 max assist for safety and increased cuing and manual facilitation of L LE positioning/placement   Standing balance and midline orientation  task with mirror feedback of having pt reach towards the R to promote L hip/knee extension activation and R weight shift to grasp horseshoe then place it on top of the mirror - pt with poor redirection of his attention to prevent strong L lean/LOB after performing the reaching task requiring heavy mod/max assist of 1 for balance.   L squat pivot EOM>w/c with pt demoing significant improvement in carryover of motor planning and sequencing transfers in this direction - mod assist primarily for pivoting hips into seat and balance while guarding L knee to promote WBing.   Transported back to room and pt left seated in w/c with needs in reach and seat belt alarm on.  Therapy Documentation Precautions:  Precautions Precautions: Fall Precaution Comments: Left hemi; Left homonymous hemianopsia Restrictions Weight Bearing Restrictions: No   Pain:  Session 1: Continues to report R UE muscle pain/soreness due to compensating pulling to achieve weight shifting during gait training - modified interventions for pain management.  Session 2: No reports of pain but does state his legs are "tired" with some muscle soreness.  Balance: Standardized Balance Assessment Standardized Balance Assessment: Berg Balance Test Berg Balance Test Sit to Stand: Needs moderate or maximal assist to stand Standing Unsupported: Unable to stand 30 seconds unassisted Sitting with Back Unsupported but Feet Supported on Floor or Stool: Able to sit 2 minutes under supervision Stand to Sit: Needs assistance to sit Transfers: Needs two people to assist of supervise to be safe Standing Unsupported with Eyes Closed: Needs help to keep from falling Standing Ubsupported with Feet Together: Needs help to attain position and unable to hold for 15 seconds From Standing, Reach Forward with Outstretched Arm: Loses balance while trying/requires external support From Standing Position, Pick up Object from Floor: Unable to try/needs assist  to keep balance From Standing Position, Turn to Look Behind Over each Shoulder: Needs assist to keep from losing balance and falling Turn 360 Degrees: Needs assistance while turning Standing Unsupported, Alternately Place Feet on Step/Stool: Needs assistance to keep from falling or unable to try Standing Unsupported, One Foot in Front: Loses balance while stepping or standing Standing on One Leg: Unable to try or needs assist to prevent fall Total Score: 3    Therapy/Group: Individual Therapy  Tawana Scale , PT, DPT, NCS, CSRS 05/24/2022, 8:24 AM

## 2022-05-24 NOTE — Progress Notes (Signed)
Patient ID: Edward Maynard, male   DOB: January 27, 1934, 89 y.o.   MRN: [id number]   Sw made attempt to call pt's daughter to provide team conference updates. Left voicemail for return call. Primary SW will be OOO 3/7 and 3/8 and will have covering Sw follow up.

## 2022-05-24 NOTE — Patient Care Conference (Cosign Needed Addendum)
Inpatient RehabilitationTeam Conference and Plan of Care Update Date: 05/24/2022   Time: 10:37 AM    Patient Name: Edward Maynard      Medical Record Number: [id number]  Date of Birth: 1933/05/30 Sex: Male         Room/Bed: 4W02C/4W02C-01 Payor Info: Payor: [employer] MEDICARE / Plan: [facility] MEDICARE / Product Type: *No Product type* /    Admit Date/Time:  05/18/2022  3:01 PM  Primary Diagnosis:  Right middle cerebral artery stroke [facility])  Hospital Problems: Principal Problem:   Right middle cerebral artery stroke [facility])    Expected Discharge Date: Expected Discharge Date: 06/15/22  Team Members Present: Physician leading conference: Dr. Alysia Penna Social Worker Present: Erlene Quan, BSW Nurse Present: Dorien Chihuahua, RN PT Present: Page Spiro, PT OT Present: Willeen Cass, OT SLP Present: Sherren Kerns, SLP PPS Coordinator present : Gunnar Fusi, SLP     Current Status/Progress Goal Weekly Team Focus  Bowel/Bladder   Pt continent B/B with some episodes of incontinence B/B  LBM 05/20/22   Pt will regain B/B incontinence   Toilet q2-4 hr qshift/prn    Swallow/Nutrition/ Hydration   D3/thin liquids, sup A   mod I  diet tolerance with use of swallowing safety strategies    ADL's   Max A U/LB dressing +2 for safety and trunk stability for LB dressing, Max A LB bathing, mod A UB bathing, max A toileting +2, max A +2 toilet/shower transfers, mod A grooming/hygeine- limited by decreased awareness, motor planning deficits, visual field deficits, and fatigue. Pt very unware of L side of body impacting his safety and independence   supervision to min A   L NMR, L visual scanning    Mobility   mod/max assist bed mobility using bed features, max +1-2 squat pivot transfers (+2 for safety when transfering towards L due to severe L inattention and motor planning deficits), max assist sit<>stand and static standing with RUE support on stable surface or needing +2  assist in order to perform a task in standing (such as LB clothing management), +2 max assist gait up to 69f using 3 Musketeer support with L LE flexor synergy during swing with impaired proprioception and 3rd person wheelchair follow   CGA transfers, min assist gait, supervision wheelchair mobility  pt/family education, bed mobility training, transfer training, gait training, L hemibody NMR, L attention, motor planning    Communication                Safety/Cognition/ Behavioral Observations  mod-to-max A limited by visual deficits, decreased awareness, and fatigue. Pt near cognitive baseline but visual deficits are acute and impacting pt safety and awareness   sup-to-min A   L visual scanning, problem solving, emergent awareness    Pain   Pt denies pain at this time   Pt will be free from pain   Assess pt for pain qshift/prn    Skin   Bruises to left arm/hip with abrasion to left knee.(Foam in place)   Pt will maintain intact skin intergrity  Assess pt skin for breakdown qshift/prn      Discharge Planning:  Discharging home with spouse, daughter and SIL to assist. Family working on 24/7. SIL [place] anticpates being with patient 8a-5p. Daughter home after 8p. Potenitally hiring caregivers 8p-9a and weekends. 2 level home, 2 steps to enter. Bed and Bath on main level.   Team Discussion: Patient with emotional lability and insomnia, left visual field cut, decreased awareness , decreased sensation  and motor planning deficits with left inattention post Right MCA CVA. Also limited by slow process, problem solving deficits and flexor synergy.  Patient on target to meet rehab goals: yes, currently needs max assist for bathing and dressing. Needs max assist for lower body care and mod assist for upper body care. Completes bed mobility with mod - max assist and needs max assist for squat pivots to the left.  Goals for discharge set for min assist overall, CGA for transfers, and min  assist for ambulation. Mod I for cognition as decreased awareness and visual deficits with problem solving and processing issues limit IADLs.   *See Care Plan and progress notes for long and short-term goals.   Revisions to Treatment Plan:  Gait training/three musketeer style ASO lace up brace Atarax and lotion for rash/itching  Teaching Needs: Safety, medications, dietary modifications, transfers, toileting, etc.   Current Barriers to Discharge: Decreased caregiver support and Home enviroment access/layout  Possible Resolutions to Barriers: Family education Hired help/caregiver     Medical Summary Current Status: emotional lability, Max Assist mobility, insomnia, upgraded diet without evidence of aspiration  Barriers to Discharge: Medical stability  Barriers to Discharge Comments: severe sensory deficits Possible Resolutions to [employer] Focus: wife with dementia, family cannot provide Physical assist   Continued Need for Acute Rehabilitation Level of Care: The patient requires daily medical management by a physician with specialized training in physical medicine and rehabilitation for the following reasons: Direction of a multidisciplinary physical rehabilitation program to maximize functional independence : Yes Medical management of patient stability for increased activity during participation in an intensive rehabilitation regime.: Yes Analysis of laboratory values and/or radiology reports with any subsequent need for medication adjustment and/or medical intervention. : Yes   I attest that I was present, lead the team conference, and concur with the assessment and plan of the team.   Dorien Chihuahua B 05/24/2022, 2:06 PM

## 2022-05-24 NOTE — Progress Notes (Signed)
PROGRESS NOTE   Subjective/Complaints:  Emotionally labile, friends are visiting    ROS: Patient denies CP, SOB, N/V/D  Objective:   No results found. No results for input(s): "WBC", "HGB", "HCT", "PLT" in the last 72 hours. No results for input(s): "NA", "K", "CL", "CO2", "GLUCOSE", "BUN", "CREATININE", "CALCIUM" in the last 72 hours.  Intake/Output Summary (Last 24 hours) at 05/24/2022 1038 Last data filed at 05/24/2022 0755 Gross per 24 hour  Intake 417 ml  Output --  Net 417 ml         Physical Exam: Vital Signs Blood pressure 121/77, pulse 80, temperature 98 F (36.7 C), resp. rate 18, height 5' 11.5" (1.816 m), weight 94 kg, SpO2 92 %.    General: No acute distress Mood and affect are appropriate Heart: Regular rate and rhythm no rubs murmurs or extra sounds Lungs: Clear to auscultation, breathing unlabored, no rales or wheezes Abdomen: Positive bowel sounds, soft nontender to palpation, nondistended Extremities: No clubbing, cyanosis, or edema Skin: No evidence of breakdown, no evidence of rash  Neurologic: Cranial nerves II through XII intact, motor strength is 5/5 in RIght deltoid, bicep, tricep, grip, hip flexor, knee extensors, ankle dorsiflexor and plantar flexor 3- Left delt , bi , tri, 2- finger flexors and extensors  3- Left HF, KE, ADF --stable in appearance Sensory exam absent LT in LUE and LLE, withdraws to pinch LUE  Cerebellar exam deferred on left due to weakness  Musculoskeletal: Full range of motion in all 4 extremities. No joint swelling   Assessment/Plan: 1. Functional deficits which require 3+ hours per day of interdisciplinary therapy in a comprehensive inpatient rehab setting. Physiatrist is providing close team supervision and 24 hour management of active medical problems listed below. Physiatrist and rehab team continue to assess barriers to discharge/monitor patient progress toward  functional and medical goals  Care Tool:  Bathing    Body parts bathed by patient: Left arm, Right arm, Chest, Abdomen, Right upper leg, Left upper leg, Front perineal area   Body parts bathed by helper: Right arm, Left arm, Front perineal area, Buttocks, Left lower leg, Right lower leg     Bathing assist Assist Level: Total Assistance - Patient < 25%     Upper Body Dressing/Undressing Upper body dressing   What is the patient wearing?: Pull over shirt    Upper body assist Assist Level: Maximal Assistance - Patient 25 - 49%    Lower Body Dressing/Undressing Lower body dressing      What is the patient wearing?: Pants, Underwear/pull up     Lower body assist Assist for lower body dressing: Total Assistance - Patient < 25%     Toileting Toileting Toileting Activity did not occur Landscape architect and hygiene only): N/A (no void or bm)  Toileting assist Assist for toileting: 2 Helpers     Transfers Chair/bed transfer  Transfers assist     Chair/bed transfer assist level: Maximal Assistance - Patient 25 - 49%     Locomotion Ambulation   Ambulation assist      Assist level: 2 helpers Assistive device: Other (comment) (Hemi-rail) Max distance: 10'   Walk 10 feet activity   Assist  Assist level: 2 helpers Assistive device: Other (comment) (R hemi-rail)   Walk 50 feet activity   Assist Walk 50 feet with 2 turns activity did not occur: Safety/medical concerns (Patient unable to ambulate >10' at this time secondary to increased fatigue with poor endurance/activity tolerance)         Walk 150 feet activity   Assist Walk 150 feet activity did not occur: Safety/medical concerns         Walk 10 feet on uneven surface  activity   Assist Walk 10 feet on uneven surfaces activity did not occur: Safety/medical concerns         Wheelchair     Assist Is the patient using a wheelchair?: Yes Type of Wheelchair: Manual    Wheelchair  assist level: Contact Guard/Touching assist Max wheelchair distance: 150'    Wheelchair 50 feet with 2 turns activity    Assist        Assist Level: Contact Guard/Touching assist   Wheelchair 150 feet activity     Assist      Assist Level: Contact Guard/Touching assist   Blood pressure 121/77, pulse 80, temperature 98 F (36.7 C), resp. rate 18, height 5' 11.5" (1.816 m), weight 94 kg, SpO2 92 %.  Medical Problem List and Plan: 1. Functional deficits secondary to right large MCA infarction due to right M2 occlusion status post IR/revascularization as well as history of CVA 2015 with left-sided residual weakness status post loop recorder             -patient may  shower             -ELOS/Goals: 14-18 days, PT min A, OT min to mod A, SLP supervision             -Continue CIR therapies including PT, OT, and SLP  - Team conference today please see physician documentation under team conference tab, met with team  to discuss problems,progress, and goals. Formulized individual treatment plan based on medical history, underlying problem and comorbidities.              -Continue work on visual scanning as he appears to have a visual field cut on the left 2.  Antithrombotics: -DVT/anticoagulation:  Pharmaceutical: Eliquis             -antiplatelet therapy: N/A 3. Pain Management: Tylenol as needed 4. Mood/Behavior/Sleep: Provide emotional support             -antipsychotic agents: N/A 5. Neuropsych/cognition: This patient is capable of making decisions on his own behalf. 6. Skin/Wound Care: Routine skin checks 7. Fluids/Electrolytes/Nutrition: Routine in and outs with follow-up chemistries 8.  Dysphagia.  Dysphagia #2 thin liquids.  Follow-up speech therapy 9.  Hypertension.  Norvasc 10 mg daily, Toprol-XL 25 mg daily.  Monitor with increased mobility.  Overall well-controlled.  Goal BP less than 180/105, long-term goal normotensive Vitals:   05/23/22 2011 05/24/22 0354  BP:  124/69 121/77  Pulse: 77 80  Resp: 18 18  Temp: 98.2 F (36.8 C) 98 F (36.7 C)  SpO2: 94% 92%  Controlled 3/5  10.  Atrial fibrillation.  Cardiac rate controlled.  Continue Toprol-xl 25 mg daily On Eliquis  11.  Hyperlipidemia.  Lipitor 20 mg daily 12.  Parotid mass, follow-up outpatient 13.  CKD 3a.    creat improved vs prior       Latest Ref Rng & Units 05/19/2022    8:48 AM 05/18/2022    4:54 AM 05/16/2022  6:25 AM  BMP  Glucose 70 - 99 mg/dL 182  127  118   BUN 8 - 23 mg/dL '23  18  27   '$ Creatinine 0.61 - 1.24 mg/dL 1.14  0.98  1.04   Sodium 135 - 145 mmol/L 134  137  138   Potassium 3.5 - 5.1 mmol/L 3.7  3.8  3.7   Chloride 98 - 111 mmol/L 99  104  107   CO2 22 - 32 mmol/L '25  22  21   '$ Calcium 8.9 - 10.3 mg/dL 9.2  9.1  8.7    14.  Insomnia- received trazodone,mirtazepine and melatonin last night. Still didn't sleep   -will dc trazodone and adjust melatonin to '5mg'$  LOS: 6 days A FACE TO FACE EVALUATION WAS PERFORMED  Charlett Blake 05/24/2022, 10:38 AM

## 2022-05-24 NOTE — Progress Notes (Signed)
Orthopedic Tech Progress Note Patient Details:  Edward Maynard 1933-09-05 [id number]  Ortho Devices Type of Ortho Device: ASO Ortho Device/Splint Interventions: Ordered     Patient currently eating supervised lunch, ASO left at bedside. Vernona Rieger 05/24/2022, 12:34 PM

## 2022-05-25 NOTE — Progress Notes (Signed)
Speech Language Pathology Daily Session Note  Patient Details  Name: Edward Maynard MRN: [id number] Date of Birth: September 07, 1933  Today's Date: 05/25/2022 SLP Individual Time: 1100-1200 SLP Individual Time Calculation (min): 60 min  Short Term Goals: Week 2: SLP Short Term Goal 1 (Week 2): Patient will consume dysphagia 3 textures and thin liquids with minimal overt s/sx of aspiration and with improved mastication effectiveness, oral clearance with use of lingual sweep with sup verbal cues for implementation of swallowing precautions SLP Short Term Goal 2 (Week 2): Patient will scan left of midline during functional tasks with mod A verbal cues to achieve 50% accuracy SLP Short Term Goal 3 (Week 2): Patient will complete medication management tasks with min-to-mod A verbal cues to achieve 70% accuracy SLP Short Term Goal 4 (Week 2): Patient will complete problem solving tasks with awareness of errors given min-to-mod A verbal cues to ID and repair  Skilled Therapeutic Interventions: Skilled ST treatment focused on cognitive goals. Pt was greeted in bed on arrival and in good spirits. Stated he's working as hard as he can despite fatigue from therapy. SLP facilitated a visual scanning task, organization, and verbal reasoning task by sorting grocery items that belong in fridge vs. pantry. Pt initially required mod A verbal cues to locate and read large print writing L of midline, however pt exhibited excellent carry over of lighthouse scanning technique and progressed to locating items on left with sup A verbal cues. Pt sorted items appropriately with sup A fading to mod I for reasoning. Pt was then asked to locate items on L in order to clean up task/put them away with sup A verbal cues to locate with extended time. Pt demonstrated increasing awareness of errors and repaired with min A verbal cues. Pt did much better with cognitive and visual scanning tasks today with session being in the morning/early  afternoon, as compared to most sessions that have taken place in later afternoon. Fluctuation in performance suspected to attribute to fatigue experienced later in the day which has impacted cognitive efficiency and accuracy. Patient was left in bed with alarm activated and immediate needs within reach at end of session. Continue per current plan of care.      Pain Pain Assessment Pain Scale: 0-10 Pain Score: 0-No pain  Therapy/Group: Individual Therapy  Patty Sermons 05/25/2022, 12:48 PM

## 2022-05-25 NOTE — Progress Notes (Signed)
Occupational Therapy Session Note  Patient Details  Name: ARZA KOETJE MRN: [id number] Date of Birth: [birth year]  Today's Date: 05/25/2022 OT Individual Time: [id number] OT Individual Time Calculation (min): 75 min    Short Term Goals: Week 1:  OT Short Term Goal 1 (Week 1): Pt will maintain dyanmic sitting balance with CGA during BADLs/functional activities OT Short Term Goal 2 (Week 1): Pt will located 5/5 items on L side of sink/tray/table with min cueing OT Short Term Goal 3 (Week 1): Pt will complete UB dressing min A OT Short Term Goal 4 (Week 1): Pt will self-feed with min A  Skilled Therapeutic Interventions/Progress Updates:  Skilled OT intervention completed with focus on ADL retraining and L NMR within the context of functional stands and transfers. Pt received upright in bed, agreeable to session. No pain reported, just "feeling weak and disappointed with not doing as well as I feel like I should be doing."  Pt was able to verify that brief was dry/no need for void. Donned TEDs with total A for time, then threaded pants with max A. Transitioned to EOB with log roll technique for hemi-positioning with overall mod A and max-total verbal cues for positioning and efficiency. Able to sit statically with close supervision however dynamically with up to [place] but pt able to self-correct if given directional cues for midline.   Mod A sit > stand with L knee block, facilitation at hip and max cues for L hemi-body positioning (same level of cues needed for all mobility), then dependent donning of pants over hips. Stood x3 with mod A and same technique/cues, however upon stance, did require 2nd person for efficient weight shifting towards R side with pt seeking hand support from person on R. MD in room for rounds; made aware of wrist cock up splint recommendation for maintaining wrist stability during mobility as pt has excessive wrist flexion and tendency to push on the wrist when in this  position as well.   Completed squat pivot transfer (R<>L) practice from EOB <> w/c x2. Required mod A initially, max A with fatigue throughout for series of squats. L knee block and max cues needed for anterior weight shifting, WB through BLE and shifting of hips. Cues needed also for attending to L side prior pivoting > L side. Max A needed for bed mobility to supine.  Pt remained upright in bed, with bed alarm on/activated, and with all needs in reach at end of session.   Therapy Documentation Precautions:  Precautions Precautions: Fall Precaution Comments: Left hemi; Left homonymous hemianopsia Restrictions Weight Bearing Restrictions: No    Therapy/Group: Individual Therapy  Blase Mess, MS, OTR/L  05/25/2022, 2:40 PM

## 2022-05-25 NOTE — Progress Notes (Signed)
Physical Therapy Session Note  Patient Details  Name: Edward Maynard MRN: [id number] Date of Birth: 1933-07-04  Today's Date: 05/25/2022 PT Individual Time: 1407-1502 PT Individual Time Calculation (min): 55 min   Short Term Goals: Week 1:  PT Short Term Goal 1 (Week 1): Patient will perform bed mobility with MinA PT Short Term Goal 2 (Week 1): Patient will perform bed/chair transfer with LRAD and ModA PT Short Term Goal 3 (Week 1): Patient will ambulate 14' with LRAD and MaxA x1  Skilled Therapeutic Interventions/Progress Updates:    Pt received supine in bed awake and agreeable to therapy session. Supine>sitting R EOB, HOB only slightly elevated, with mod assist for trunk upright and L LE management - pt continues to try and push with L UE to bring trunk upright but wrist lands in excessive flexion, awaiting wrist cock-up splint to use during mobility to protect the integrity of the wrist joint.   Donned L LE ASO to protect pt's ankle from inversion rolling during gait training.  Sit>stand EOB>L UE support around therapist with mod assist for balance once in standing due to strong L anterior lean and therapist guarding/blocking L knee (not true buckling but pt keeps it in excessive flexion) - pulled pants up over hips dependently.  L squat pivot transfer EOB>w/c with continued max cuing for R anterior trunk lean, pushing up with R hand from bed, and lifting hips by pushing up through LEs - mod assist for lifting/pivoting hips and guarding L knee.   Transported to/from gym in w/c for time management and energy conservation.  Donned L LE leg lifter to assist with swing phase facilitation during gait as pt continues to have inconsistent muscle activation due to absent/impaired sensation and tendency towards flexor synergy pattern with risk for ankle inversion rolling.  Gait training 2f x2 + 830fusing +2 R HHA and L UE support around therapist's shoulders - +2 mod assist for balance and 3rd  person w/c follow on final gait trial to allow longer distance. Pt demonstrating the following gait deviations with therapist providing the described cuing and facilitation for improvement:  - pt demos improved upright posture throughout gait requiring decreased cuing for this - continues to have pretty consistent heavy L lean throughout gait unless cued for correction (has to lean pretty far L outside BOS prior to pt being able to recognize it) - demos improved ability to weight shift towards R when verbally/visually cued to bring his body towards +2 assist during R stance phase (has a harder time weight shifting if he takes too large of a step forward with R LE)  - able to advance L LE during swing with inconsistent muscle activation/motor pattern requiring max/total assist via leg strap and therapist's LE to position pt's LE correctly/safely to protect his ankle - tendency towards flexor synergy pattern -started with max/total step-by-step verbal cuing for sequencing; however, transitioned to only providing manual facilitation to initiate and move with pt starting to have a more consistent stepping pattern and weight shift but still requiring heavy +2 mod assist  Pt hard on himself throughout session and expressing that he wishes he was able to do more - therapist provides emotional support and encouragement on his progress in only 3 days!  Transported back to his room and pt agreeable to remain sitting up for a little while - NT made aware of pt's position. Pt left with needs in reach, L UE supported on 1/2 lap tray, and seat belt alarm on.  Therapy Documentation Precautions:  Precautions Precautions: Fall Precaution Comments: Left hemi; Left homonymous hemianopsia Restrictions Weight Bearing Restrictions: No   Pain: No reports of pain, only fatigue.     Therapy/Group: Individual Therapy  Tawana Scale , PT, DPT, NCS, CSRS 05/25/2022, 1:01 PM

## 2022-05-25 NOTE — Progress Notes (Signed)
PROGRESS NOTE   Subjective/Complaints:  Left lean with standing during PT/OT co treat this am in room  Had episode of horizontal nystagmus noted by PT yesterday that lasted several minutes Pt without increased dizziness during position changes   ROS: Patient denies CP, SOB, N/V/D  Objective:   No results found. No results for input(s): "WBC", "HGB", "HCT", "PLT" in the last 72 hours. No results for input(s): "NA", "K", "CL", "CO2", "GLUCOSE", "BUN", "CREATININE", "CALCIUM" in the last 72 hours.  Intake/Output Summary (Last 24 hours) at 05/25/2022 0834 Last data filed at 05/25/2022 0806 Gross per 24 hour  Intake 771 ml  Output 950 ml  Net -179 ml         Physical Exam: Vital Signs Blood pressure 108/68, pulse 64, temperature 98 F (36.7 C), resp. rate 18, height 5' 11.5" (1.816 m), weight 94 kg, SpO2 92 %.    General: No acute distress Mood and affect are appropriate Heart: Regular rate and rhythm no rubs murmurs or extra sounds Lungs: Clear to auscultation, breathing unlabored, no rales or wheezes Abdomen: Positive bowel sounds, soft nontender to palpation, nondistended Extremities: No clubbing, cyanosis, or edema Skin: No evidence of breakdown, no evidence of rash  Neuro:  Eyes without evidence of nystagmus  Tone is normal without evidence of spasticity Cerebellar exam not tested due to weakness on L  Cranial nerves II- Visual fields are intact to confrontation testing, no blurring of vision III- no evidence of ptosis, upward, downward and medial gaze intact IV- no vertical diplopia or head tilt V- no facial numbness or masseter weakness VI- no pupil abduction weakness VII- no facial droop, good lid closure VII- normal auditory acuity IX- no pharygeal weakness, gag nl X- no pharyngeal weakness, no hoarseness XI- no trap or SCM weakness XII- no glossal weakness   3- Left delt , bi , tri, 2- finger  flexors and extensors  3- Left HF, KE, ADF --stable in appearance Sensory exam absent LT in LUE and LLE, withdraws to pinch LUE   Musculoskeletal: Full range of motion in all 4 extremities. No joint swelling   Assessment/Plan: 1. Functional deficits which require 3+ hours per day of interdisciplinary therapy in a comprehensive inpatient rehab setting. Physiatrist is providing close team supervision and 24 hour management of active medical problems listed below. Physiatrist and rehab team continue to assess barriers to discharge/monitor patient progress toward functional and medical goals  Care Tool:  Bathing    Body parts bathed by patient: Left arm, Right arm, Chest, Abdomen, Right upper leg, Left upper leg, Front perineal area   Body parts bathed by helper: Right arm, Left arm, Front perineal area, Buttocks, Left lower leg, Right lower leg     Bathing assist Assist Level: Total Assistance - Patient < 25%     Upper Body Dressing/Undressing Upper body dressing   What is the patient wearing?: Pull over shirt    Upper body assist Assist Level: Maximal Assistance - Patient 25 - 49%    Lower Body Dressing/Undressing Lower body dressing      What is the patient wearing?: Pants, Underwear/pull up     Lower body assist Assist for lower  body dressing: Total Assistance - Patient < 25%     Toileting Toileting Toileting Activity did not occur Landscape architect and hygiene only): N/A (no void or bm)  Toileting assist Assist for toileting: 2 Helpers     Transfers Chair/bed transfer  Transfers assist     Chair/bed transfer assist level: Maximal Assistance - Patient 25 - 49%     Locomotion Ambulation   Ambulation assist      Assist level: 2 helpers Assistive device: Other (comment) (Hemi-rail) Max distance: 10'   Walk 10 feet activity   Assist     Assist level: 2 helpers Assistive device: Other (comment) (R hemi-rail)   Walk 50 feet activity   Assist  Walk 50 feet with 2 turns activity did not occur: Safety/medical concerns (Patient unable to ambulate >10' at this time secondary to increased fatigue with poor endurance/activity tolerance)         Walk 150 feet activity   Assist Walk 150 feet activity did not occur: Safety/medical concerns         Walk 10 feet on uneven surface  activity   Assist Walk 10 feet on uneven surfaces activity did not occur: Safety/medical concerns         Wheelchair     Assist Is the patient using a wheelchair?: Yes Type of Wheelchair: Manual    Wheelchair assist level: Contact Guard/Touching assist Max wheelchair distance: 150'    Wheelchair 50 feet with 2 turns activity    Assist        Assist Level: Contact Guard/Touching assist   Wheelchair 150 feet activity     Assist      Assist Level: Contact Guard/Touching assist   Blood pressure 108/68, pulse 64, temperature 98 F (36.7 C), resp. rate 18, height 5' 11.5" (1.816 m), weight 94 kg, SpO2 92 %.  Medical Problem List and Plan: 1. Functional deficits secondary to right large MCA infarction due to right M2 occlusion status post IR/revascularization as well as history of CVA 2015 with left-sided residual weakness status post loop recorder             -patient may  shower             -ELOS/Goals: 3/28, PT min A, OT min to mod A, SLP supervision                          -Continue work on visual scanning as he appears to have a visual field cut on the left 2.  Antithrombotics: -DVT/anticoagulation:  Pharmaceutical: Eliquis             -antiplatelet therapy: N/A 3. Pain Management: Tylenol as needed 4. Mood/Behavior/Sleep: Provide emotional support             -antipsychotic agents: N/A 5. Neuropsych/cognition: This patient is capable of making decisions on his own behalf. 6. Skin/Wound Care: Routine skin checks 7. Fluids/Electrolytes/Nutrition: Routine in and outs with follow-up chemistries 8.  Dysphagia.   Dysphagia #2 thin liquids.  Follow-up speech therapy 9.  Hypertension.  Norvasc 10 mg daily, Toprol-XL 25 mg daily.  Monitor with increased mobility.  Overall well-controlled.  Goal BP less than 180/105, long-term goal normotensive Vitals:   05/24/22 1956 05/25/22 0436  BP: 135/78 108/68  Pulse: 79 64  Resp: 18 18  Temp: 98.5 F (36.9 C) 98 F (36.7 C)  SpO2: 93% 92%  Controlled 3/7  10.  Atrial fibrillation.  Cardiac rate controlled.  Continue Toprol-xl 25 mg daily On Eliquis  11.  Hyperlipidemia.  Lipitor 20 mg daily 12.  Parotid mass, follow-up outpatient 13.  CKD 3a.    creat improved vs prior       Latest Ref Rng & Units 05/19/2022    8:48 AM 05/18/2022    4:54 AM 05/16/2022    6:25 AM  BMP  Glucose 70 - 99 mg/dL 182  127  118   BUN 8 - 23 mg/dL '23  18  27   '$ Creatinine 0.61 - 1.24 mg/dL 1.14  0.98  1.04   Sodium 135 - 145 mmol/L 134  137  138   Potassium 3.5 - 5.1 mmol/L 3.7  3.8  3.7   Chloride 98 - 111 mmol/L 99  104  107   CO2 22 - 32 mmol/L '25  22  21   '$ Calcium 8.9 - 10.3 mg/dL 9.2  9.1  8.7    14.  Insomnia- received trazodone,mirtazepine and melatonin last night. Still didn't sleep   -will dc trazodone and adjust melatonin to '5mg'$  15.  Brief episode of nystagmus - suspect peripheral cause will cont to monitor , has remote R cerebellar infarct on MRI but MRA not showing any sig VB disease LOS: 7 days A FACE TO FACE EVALUATION WAS PERFORMED  Charlett Blake 05/25/2022, 8:34 AM

## 2022-05-26 MED ORDER — QUETIAPINE FUMARATE 25 MG PO TABS
25.0000 mg | ORAL_TABLET | Freq: Every evening | ORAL | Status: DC
  Administered 2022-05-26 (×2): 25 mg via ORAL
  Filled 2022-05-26 (×2): qty 1

## 2022-05-26 MED ORDER — QUETIAPINE FUMARATE 25 MG PO TABS: 25.0000 mg | ORAL_TABLET | Freq: Every day | ORAL | Status: DC

## 2022-05-26 NOTE — Progress Notes (Signed)
Occupational Therapy Weekly Progress Note  Patient Details  Name: Edward Maynard MRN: [id number] Date of Birth: March 17, 1934  Beginning of progress report period: May 19, 2022 End of progress report period: May 26, 2022  Today's Date: 05/26/2022 OT Individual Time: [id number] OT Individual Time Calculation (min): 61 min  OT Individual Time: 1300-1330 OT Individual Time Calculation (min): 30 min  Patient has met 1 of 4 short term goals. Pt is demonstrating improved independence in sitting balance requiring CGA for BADL seated EOB. Pt continued to required mod to max for UB dressing d/t motor planning and attention challenges. Pt requiring max A for LB dressing, however demonstrating increase participating in weaving BLEs in sitting and recalling hemi-dressing techniques. Pt   Patient continues to demonstrate the following deficits: muscle weakness and muscle joint tightness, decreased cardiorespiratoy endurance, impaired timing and sequencing, abnormal tone, unbalanced muscle activation, motor apraxia, ataxia, decreased coordination, and decreased motor planning, decreased visual motor skills, decreased midline orientation, decreased attention to left, L visual field cut left side neglect, decreased motor planning, and ideational apraxia, decreased initiation, decreased attention, decreased awareness, decreased problem solving, decreased safety awareness, decreased memory, and delayed processing, and decreased sitting balance, decreased standing balance, decreased postural control, hemiplegia, and decreased balance strategies and therefore will continue to benefit from skilled OT intervention to enhance overall performance with BADL and Reduce care partner burden.  Patient progressing toward long term goals..  Continue plan of care.  OT Short Term Goals Week 1:  OT Short Term Goal 1 (Week 1): Pt will maintain dyanmic sitting balance with CGA during BADLs/functional activities OT Short Term Goal 1 -  Progress (Week 1): Met OT Short Term Goal 2 (Week 1): Pt will located 5/5 items on L side of sink/tray/table with min cueing OT Short Term Goal 2 - Progress (Week 1): Progressing toward goal OT Short Term Goal 3 (Week 1): Pt will complete UB dressing min A OT Short Term Goal 3 - Progress (Week 1): Progressing toward goal OT Short Term Goal 4 (Week 1): Pt will self-feed with min A OT Short Term Goal 4 - Progress (Week 1): Progressing toward goal Week 2:  OT Short Term Goal 1 (Week 2): Pt will complete UB bathing min A OT Short Term Goal 2 (Week 2): Pt will attend to L visual field/environment/body during BADLs/functional tasks with min cueing  Skilled Therapeutic Interventions/Progress Updates:      AM Session:  08:05- Attempted to see Pt for regularly schedule OT session. Pt reporting fatigue as he had not slept well previous night and requesting to eat breakfast this AM. Informed Pt OT would return to see Pt later in day to allow time for rest and to eat. Missed 14 minutes of skilled treatment d/t Pt fatigue.   09:15- Returned to see Pt this AM with Pt received sleeping in bed easy to wake and receptive to skilled OT session. Pt reporting 0/10 pain and motivated to participate in session. +2 A available during session to increase Pt safety. Pt transitioned to EOB with mod A to lift trunk and bring LLE off bed with max cueing provided on technique. Upon lifting trunk, pt LLE presenting with flexor synergy pattern with LLE moving into flexion. Sitting EOB Pt able to doff shirt with min A and max VB cueing to recall overhead hemi-dressing technique and attend to LUE. Pt able to recall hemi-dressing technique of donning clothing with min VB cueing. Set-up/oriented shirt total A for Pt with Pt able to  weave BUEs into sleeves and bring shirt overhead with max cueing for attention/technique requiring overall mod A to weave LUE and bring shirt over his back. Pt able to maintain seated balance with CGA. Pt  recalled dressing LLE first with max A required to weave BLEs, however Pt participating in task by holding pants waist and bringing trunk forward with min A. Pt completed sit>stand mod A with L knee blocked to prevent buckling with +2 A bringing pants to waist. Pt maintained standing balance with tactile and VB cues provided to locate center mod A ~1 minute. Squat pivot>wc to R side max A with +2 avaliable to stabilize wc and guide Pt hips. Pt positioned in front of sink to provide visual feedback with Pt able to bring trunk to midline with min cuing. Pt completed sit>stand with visual feedback mod A +2 with max tactile and VB cues provided for weight shifting. Pt pushing to L side and posteriorly during stand, however able to weight shift to R side with cueing. Engaged pt in functional reaching tasks in standing using RUE to decrease pushing with noted improvement and Pt able to reach to turn on/off water and locate items on sink with mod VB cues provided. Stand>sit mod A with max cueing for muscle control and technique. Provided Pt rest break. Facilitated anterior weight shifting and LUE using during grooming/hygiene tasks with Pt providing self guided HOH A to LUE while washing face with min A from therapist to support increased pressure to LUE. Remainder of session focused on LUE NMR with targeted and cross body reaching incorporated to increase LUE functional use and awareness. Pt left resting in wc at end of session with call bell in reach, seat belt alarm on, and all needs met.   PM Session: Pt received supine in bed presenting to be in good spirits 0/10 pain and receptive to skilled OT session. Discussed bed positioning following CVA with Pt with education provided on sleeping on hemiparetic vs non-hemiparetic side with Pt receptive to education. Pt reporting he prefers to sleep on his L side with OT encouraging continuing to do so to increase proprioceptive input to hemiparetic U/LES. Provided Pt handout  placed over head of bed to support carryover between staff. Pt transitioned to EOB with mod A to lift trunk with max VB cues provided to facilitate reaching across body with LUE and pushing with RUE. Pt able to maintain sitting balance EOB close supervision to occasional CGA this session. Pt participated in a series of NMR activities to facilitate functional grasp pattern and targeted reaching sitting EOB. Pt continuing to present with ataxic LUE movements and motor planning challenges impacting functional reach. Pt able to grasp sponge/wash cloth to bring to mouth and shoulder to facilitate cross body reaching with initiation of finger flexion for release noted. Pt reporting fatigue at end of session requesting to return to bed. Pt mod A to lift legs into bed. Pt able to roll onto L side mod A with pillow placed between knees, behind back, and under head to support hemi positioning. Pt left resting in bed with call bell  in reach, bed alarm on, and all needs met.   Therapy Documentation Precautions:  Precautions Precautions: Fall Precaution Comments: Left hemi; Left homonymous hemianopsia Restrictions Weight Bearing Restrictions: No General:     ADL: ADL Eating: Moderate assistance Where Assessed-Eating: Bed level Grooming: Moderate assistance Where Assessed-Grooming: Edge of bed Upper Body Bathing: Moderate assistance, Maximal assistance Where Assessed-Upper Body Bathing: Edge of bed  Lower Body Bathing: Maximal assistance Where Assessed-Lower Body Bathing: Edge of bed Upper Body Dressing: Moderate assistance, Maximal assistance Where Assessed-Upper Body Dressing: Edge of bed Lower Body Dressing: Maximal assistance Where Assessed-Lower Body Dressing: Edge of bed Toileting: Dependent, Maximal assistance Where Assessed-Toileting: Bed level Toilet Transfer: Dependent, Maximal assistance (stedy, able to pull self to standing; not safety to atempt squat pivot or ambulate to bathroom) Toilet  Transfer Equipment: Bedside commode Tub/Shower Transfer: Not assessed Social research officer, government: Dependent, Maximal assistance (stedy) Social research officer, government Method: Other (comment) (stedy) Walk-In Shower Equipment: Radio broadcast assistant   Therapy/Group: Individual Therapy  Janey Genta 05/26/2022, 7:58 AM

## 2022-05-26 NOTE — Progress Notes (Addendum)
Speech Language Pathology Daily Session Note  Patient Details  Name: Edward Maynard MRN: [id number] Date of Birth: [birth year]  Today's Date: 05/26/2022 SLP Individual Time: [id number] SLP Individual Time Calculation (min): 55 min  Short Term Goals: Week 2: SLP Short Term Goal 1 (Week 2): Patient will consume dysphagia 3 textures and thin liquids with minimal overt s/sx of aspiration and with improved mastication effectiveness, oral clearance with use of lingual sweep with sup verbal cues for implementation of swallowing precautions SLP Short Term Goal 2 (Week 2): Patient will scan left of midline during functional tasks with mod A verbal cues to achieve 50% accuracy SLP Short Term Goal 3 (Week 2): Patient will complete medication management tasks with min-to-mod A verbal cues to achieve 70% accuracy SLP Short Term Goal 4 (Week 2): Patient will complete problem solving tasks with awareness of errors given min-to-mod A verbal cues to ID and repair  Skilled Therapeutic Interventions: Pt seen this date for skilled ST intervention targeting cognitive goals outlined in care plan. Pt received awake, though appeared visibly fatigued - lying semi-reclined in bed. Agreeable to intervention at bedside. Repositioned in bed with Min A. Pt appearing slightly restless and anxious/depressed about slower than anticipated progress.   During phone call with friend, pt verbalized intellectual and emergent awareness at the Mod I level; however, when discussing with therapist, pt stated he did not recall - question if this is more behavioral and self-limiting. Completed visual scanning task with Min-Mod A verbal and visual cues to scan from right to left and utilize lighthouse scanning technique during structured box checking task and functional writing task related to providing personal information on mock intake form. Directed self-care with Sup A verbal cues; required Total A to operate phone - answer incoming call and  place on speaker phone - states to friend that he is having difficulty with this task like due to motoric and visual limitations. Pt continues to report fatigue and asking why this is the case; recommended that pt discuss with MD.     Pt left in room and in bed with all safety measures activated and call bell within reach. Direct hand off to NT for toileting needs. Continue per current ST POC.  Pain No pain reported; NAD  Therapy/Group: Individual Therapy  Keelin Neville A Johonna Binette 05/26/2022, 3:32 PM

## 2022-05-26 NOTE — Progress Notes (Signed)
Physical Therapy Session Note  Patient Details  Name: Edward Maynard MRN: [id number] Date of Birth: August 23, 1933  Today's Date: 05/26/2022 PT Individual Time: 1033-1120 PT Individual Time Calculation (min): 47 min   Short Term Goals: Week 1:  PT Short Term Goal 1 (Week 1): Patient will perform bed mobility with MinA PT Short Term Goal 2 (Week 1): Patient will perform bed/chair transfer with LRAD and ModA PT Short Term Goal 3 (Week 1): Patient will ambulate 26' with LRAD and MaxA x1   Skilled Therapeutic Interventions/Progress Updates:  Patient seated upright in w/c on entrance to room. Patient alert and agreeable to PT session. Relates that he did not sleep well or very much the previous night. Is very fatigued this day, already.   Patient with no pain complaint at start of session.  Therapeutic Activity: Transfers: Pt performed sit<>stand transfers requiring countdown and ModA to no AD using RUE to push-to-stand. Mod A to attain balance to +2 on R side. Significant forward lean that pt cannot self correct. VC for shifting weight to R heel.   Sit<>stand at hallway handrail with MinA d/t use of stable handrail to assist with balance. Pt with intermittent ability to self correct L knee into extension with vc. Guard to L knee provided throughout session with varying need to assist to extension.   Neuromuscular Re-ed: NMR facilitated during session with focus on standing posture and balance. Pt guided in sit<>stand technique and static stance with focus on shifting weight to RLE. RW placed in front of pt for assist with balance, but pt preferring to hold to person on R side. Pt was able to self correct L knee into extension with vc only about 30% of cues and could hold briefly with slow melt into flexion. Guided in weight shifting for awareness of proprioception, improved lumbopelvic mobility/ coordination, and joint approximation in LLE.  NMR performed for improvements in motor control and  coordination, balance, sequencing, judgement, and self confidence/ efficacy in performing all aspects of mobility at highest level of independence.   Gait Training:  Pt ambulated at R hallway HR reaching 66' x2 with ModA overall for guard to L knee, facilitation of lateral weight shift, placement of L foot at termination of swing phase, upright posture, and facilitation of hip extension. Required mainly vc but overall multimodal cueing for upright posture, sequencing lateral weight shifting, foot advancement/ placement.   Back in room, pt performs stand pivot with use of bedrail in R hand requiring assist with backward positioning and placement of L foot - overall Min/ ModA. Able to return to supine with CGA for LLE d/t reduced awareness of extremity.   Patient supine in bed  at end of session with brakes locked, BED alarm set, and all needs within reach. Oriented to time and time of upcoming lunch and OT sessions.    Therapy Documentation Precautions:  Precautions Precautions: Fall Precaution Comments: Left hemi; Left homonymous hemianopsia Restrictions Weight Bearing Restrictions: No General:   Vital Signs:  Pain: Pain Assessment Pain Scale: 0-10 Pain Score: 0-No pain  Therapy/Group: Individual Therapy  Alger Simons PT, DPT, CSRS 05/26/2022, 12:26 PM

## 2022-05-26 NOTE — Progress Notes (Addendum)
PROGRESS NOTE   Subjective/Complaints: Pt slept poorly, nsg notes increased anxiety with frequent calls in the evening, no agitation or safety issues.  Pt denies bowel or bladder issues bothering him at night , "just can't get comfortable"  ROS: Patient denies CP, SOB, N/V/D  Objective:   No results found. No results for input(s): "WBC", "HGB", "HCT", "PLT" in the last 72 hours. No results for input(s): "NA", "K", "CL", "CO2", "GLUCOSE", "BUN", "CREATININE", "CALCIUM" in the last 72 hours.  Intake/Output Summary (Last 24 hours) at 05/26/2022 0747 Last data filed at 05/25/2022 1800 Gross per 24 hour  Intake 712 ml  Output 200 ml  Net 512 ml         Physical Exam: Vital Signs Blood pressure (!) 141/83, pulse 82, temperature 99 F (37.2 C), temperature source Oral, resp. rate 16, height 5' 11.5" (1.816 m), weight 93.8 kg, SpO2 93 %.    General: No acute distress Mood and affect are appropriate Heart: Regular rate and rhythm no rubs murmurs or extra sounds Lungs: Clear to auscultation, breathing unlabored, no rales or wheezes Abdomen: Positive bowel sounds, soft nontender to palpation, nondistended Extremities: No clubbing, cyanosis, or edema Skin: No evidence of breakdown, no evidence of rash  Neuro:  Eyes without evidence of nystagmus  Tone is normal without evidence of spasticity Cerebellar exam not tested due to weakness on L   3- Left delt , bi , tri, 2- finger flexors and extensors  3- Left HF, KE, ADF --stable in appearance Sensory exam absent LT in LUE and LLE, withdraws to pinch LUE   Musculoskeletal: Full range of motion in all 4 extremities. No joint swelling   Assessment/Plan: 1. Functional deficits which require 3+ hours per day of interdisciplinary therapy in a comprehensive inpatient rehab setting. Physiatrist is providing close team supervision and 24 hour management of active medical problems  listed below. Physiatrist and rehab team continue to assess barriers to discharge/monitor patient progress toward functional and medical goals  Care Tool:  Bathing    Body parts bathed by patient: Left arm, Right arm, Chest, Abdomen, Right upper leg, Left upper leg, Front perineal area   Body parts bathed by helper: Right arm, Left arm, Front perineal area, Buttocks, Left lower leg, Right lower leg     Bathing assist Assist Level: Total Assistance - Patient < 25%     Upper Body Dressing/Undressing Upper body dressing   What is the patient wearing?: Pull over shirt    Upper body assist Assist Level: Maximal Assistance - Patient 25 - 49%    Lower Body Dressing/Undressing Lower body dressing      What is the patient wearing?: Pants     Lower body assist Assist for lower body dressing: Total Assistance - Patient < 25%     Toileting Toileting Toileting Activity did not occur Landscape architect and hygiene only): N/A (no void or bm)  Toileting assist Assist for toileting: 2 Helpers     Transfers Chair/bed transfer  Transfers assist     Chair/bed transfer assist level: Maximal Assistance - Patient 25 - 49%     Locomotion Ambulation   Ambulation assist  Assist level: 2 helpers Assistive device: Other (comment) (Hemi-rail) Max distance: 10'   Walk 10 feet activity   Assist     Assist level: 2 helpers Assistive device: Other (comment) (R hemi-rail)   Walk 50 feet activity   Assist Walk 50 feet with 2 turns activity did not occur: Safety/medical concerns (Patient unable to ambulate >10' at this time secondary to increased fatigue with poor endurance/activity tolerance)         Walk 150 feet activity   Assist Walk 150 feet activity did not occur: Safety/medical concerns         Walk 10 feet on uneven surface  activity   Assist Walk 10 feet on uneven surfaces activity did not occur: Safety/medical concerns          Wheelchair     Assist Is the patient using a wheelchair?: Yes Type of Wheelchair: Manual    Wheelchair assist level: Contact Guard/Touching assist Max wheelchair distance: 150'    Wheelchair 50 feet with 2 turns activity    Assist        Assist Level: Contact Guard/Touching assist   Wheelchair 150 feet activity     Assist      Assist Level: Contact Guard/Touching assist   Blood pressure (!) 141/83, pulse 82, temperature 99 F (37.2 C), temperature source Oral, resp. rate 16, height 5' 11.5" (1.816 m), weight 93.8 kg, SpO2 93 %.  Medical Problem List and Plan: 1. Functional deficits secondary to right large MCA infarction due to right M2 occlusion status post IR/revascularization as well as history of CVA 2015 with left-sided residual weakness status post loop recorder             -patient may  shower             -ELOS/Goals: 3/28, PT min A, OT min to mod A, SLP supervision                          -Continue work on visual scanning as he appears to have a visual field cut on the left 2.  Antithrombotics: -DVT/anticoagulation:  Pharmaceutical: Eliquis             -antiplatelet therapy: N/A 3. Pain Management: Tylenol as needed 4. Mood/Behavior/Sleep: Provide emotional support             -antipsychotic agents: N/A 5. Neuropsych/cognition: This patient is capable of making decisions on his own behalf. 6. Skin/Wound Care: Routine skin checks 7. Fluids/Electrolytes/Nutrition: Routine in and outs with follow-up chemistries 8.  Dysphagia.  Dysphagia #2 thin liquids.  Follow-up speech therapy 9.  Hypertension.  Norvasc 10 mg daily, Toprol-XL 25 mg daily.  Monitor with increased mobility.  Overall well-controlled.  Goal BP less than 180/105, long-term goal normotensive Vitals:   05/25/22 1945 05/26/22 0317  BP: 135/74 (!) 141/83  Pulse: 80 82  Resp: 16 16  Temp: 98.7 F (37.1 C) 99 F (37.2 C)  SpO2: 94% 93%  Controlled 3/8  10.  Atrial fibrillation.   Cardiac rate controlled.  Continue Toprol-xl 25 mg daily On Eliquis  11.  Hyperlipidemia.  Lipitor 20 mg daily 12.  Parotid mass, follow-up outpatient 13.  CKD 3a.    creat improved vs prior       Latest Ref Rng & Units 05/19/2022    8:48 AM 05/18/2022    4:54 AM 05/16/2022    6:25 AM  BMP  Glucose 70 - 99 mg/dL 182  127  118   BUN 8 - 23 mg/dL '23  18  27   '$ Creatinine 0.61 - 1.24 mg/dL 1.14  0.98  1.04   Sodium 135 - 145 mmol/L 134  137  138   Potassium 3.5 - 5.1 mmol/L 3.7  3.8  3.7   Chloride 98 - 111 mmol/L 99  104  107   CO2 22 - 32 mmol/L '25  22  21   '$ Calcium 8.9 - 10.3 mg/dL 9.2  9.1  8.7    14.  Insomnia- received trazodone,mirtazepine and melatonin last night. Still didn't sleep   May have mild sundowning , trial low dose seroquel 3/8 Q 3pm  15.  Brief episode of nystagmus - suspect peripheral cause will cont to monitor , has remote R cerebellar infarct on MRI but MRA not showing any sig VB disease LOS: 8 days A FACE TO FACE EVALUATION WAS PERFORMED  Charlett Blake 05/26/2022, 7:47 AM

## 2022-05-26 NOTE — Progress Notes (Signed)
Orthopedic Tech Progress Note Patient Details:  Edward Maynard [birth year] [id number]  Called in order to [employer] for a [employer]PLINT   Patient ID: Edward Maynard, male   DOB: 1933/08/29, 89 y.o.   MRN: [id number]  Edward Maynard 05/26/2022, 9:59 PM

## 2022-05-27 MED ORDER — QUETIAPINE FUMARATE 25 MG PO TABS
12.5000 mg | ORAL_TABLET | Freq: Every evening | ORAL | Status: DC
  Administered 2022-05-27 – 2022-06-07 (×12): 12.5 mg via ORAL
  Filled 2022-05-27 (×13): qty 1

## 2022-05-27 NOTE — Progress Notes (Addendum)
PROGRESS NOTE   Subjective/Complaints: Seems to have slept a little better last night. Still feels a little tired and foggy at times however. No sleep chart presently.  ROS: Patient denies fever, rash, sore throat, blurred vision,   nausea, vomiting, diarrhea, cough, shortness of breath or chest pain, joint or back/neck pain, headache, or mood change.   Objective:   No results found. No results for input(s): "WBC", "HGB", "HCT", "PLT" in the last 72 hours. No results for input(s): "NA", "K", "CL", "CO2", "GLUCOSE", "BUN", "CREATININE", "CALCIUM" in the last 72 hours.  Intake/Output Summary (Last 24 hours) at 05/27/2022 1016 Last data filed at 05/27/2022 0800 Gross per 24 hour  Intake 537 ml  Output 1400 ml  Net -863 ml        Physical Exam: Vital Signs Blood pressure 136/80, pulse 83, temperature (!) 97.4 F (36.3 C), temperature source Oral, resp. rate 19, height 5' 11.5" (1.816 m), weight 93.8 kg, SpO2 94 %.    Constitutional: No distress . Vital signs reviewed. HEENT: NCAT, EOMI, oral membranes moist Neck: supple Cardiovascular: RRR without murmur. No JVD    Respiratory/Chest: CTA Bilaterally without wheezes or rales. Normal effort    GI/Abdomen: BS +, non-tender, non-distended Ext: no clubbing, cyanosis, or edema Psych: pleasant and cooperative  Skin: No evidence of breakdown, no evidence of rash  Neuro:  Eyes without evidence of nystagmus  Tone is normal without evidence of spasticity Cerebellar exam not tested due to weakness on L   3- to 3+/5 Left delt , bi , tri, 1/5 finger flexors and extensors  3- to 3/5 Left HF, KE, ADF --stable in appearance Sensory exam absent LT in LUE and LLE, withdraws to pinch LUE   Musculoskeletal: Full range of motion in all 4 extremities. No joint swelling   Assessment/Plan: 1. Functional deficits which require 3+ hours per day of interdisciplinary therapy in a  comprehensive inpatient rehab setting. Physiatrist is providing close team supervision and 24 hour management of active medical problems listed below. Physiatrist and rehab team continue to assess barriers to discharge/monitor patient progress toward functional and medical goals  Care Tool:  Bathing    Body parts bathed by patient: Left arm, Right arm, Chest, Abdomen, Right upper leg, Left upper leg, Front perineal area   Body parts bathed by helper: Right arm, Left arm, Front perineal area, Buttocks, Left lower leg, Right lower leg     Bathing assist Assist Level: Total Assistance - Patient < 25%     Upper Body Dressing/Undressing Upper body dressing   What is the patient wearing?: Pull over shirt    Upper body assist Assist Level: Maximal Assistance - Patient 25 - 49%    Lower Body Dressing/Undressing Lower body dressing      What is the patient wearing?: Pants     Lower body assist Assist for lower body dressing: Total Assistance - Patient < 25%     Toileting Toileting Toileting Activity did not occur Landscape architect and hygiene only): N/A (no void or bm)  Toileting assist Assist for toileting: 2 Helpers     Transfers Chair/bed transfer  Transfers assist     Chair/bed transfer assist  level: Maximal Assistance - Patient 25 - 49%     Locomotion Ambulation   Ambulation assist      Assist level: 2 helpers Assistive device: Other (comment) (Hemi-rail) Max distance: 10'   Walk 10 feet activity   Assist     Assist level: 2 helpers Assistive device: Other (comment) (R hemi-rail)   Walk 50 feet activity   Assist Walk 50 feet with 2 turns activity did not occur: Safety/medical concerns (Patient unable to ambulate >10' at this time secondary to increased fatigue with poor endurance/activity tolerance)         Walk 150 feet activity   Assist Walk 150 feet activity did not occur: Safety/medical concerns         Walk 10 feet on uneven  surface  activity   Assist Walk 10 feet on uneven surfaces activity did not occur: Safety/medical concerns         Wheelchair     Assist Is the patient using a wheelchair?: Yes Type of Wheelchair: Manual    Wheelchair assist level: Contact Guard/Touching assist Max wheelchair distance: 150'    Wheelchair 50 feet with 2 turns activity    Assist        Assist Level: Contact Guard/Touching assist   Wheelchair 150 feet activity     Assist      Assist Level: Contact Guard/Touching assist   Blood pressure 136/80, pulse 83, temperature (!) 97.4 F (36.3 C), temperature source Oral, resp. rate 19, height 5' 11.5" (1.816 m), weight 93.8 kg, SpO2 94 %.  Medical Problem List and Plan: 1. Functional deficits secondary to right large MCA infarction due to right M2 occlusion status post IR/revascularization as well as history of CVA 2015 with left-sided residual weakness status post loop recorder             -patient may  shower             -ELOS/Goals: 3/28, PT min A, OT min to mod A, SLP supervision            -Continue CIR therapies including PT, OT, and SLP               -Continue work on visual scanning as he appears to have a visual field cut on the left 2.  Antithrombotics: -DVT/anticoagulation:  Pharmaceutical: Eliquis             -antiplatelet therapy: N/A 3. Pain Management: Tylenol as needed 4. Mood/Behavior/Sleep: Provide emotional support             -antipsychotic agents: N/A  -check sleep chart 5. Neuropsych/cognition: This patient is capable of making decisions on his own behalf. 6. Skin/Wound Care: Routine skin checks 7. Fluids/Electrolytes/Nutrition: Routine in and outs with follow-up chemistries 8.  Dysphagia.  Diet advanced to Dysphagia 3 thin liquids.    9.  Hypertension.  Norvasc 10 mg daily, Toprol-XL 25 mg daily.  Monitor with increased mobility.  Overall well-controlled.  Goal BP less than 180/105, long-term goal normotensive Vitals:    05/27/22 0459 05/27/22 0932  BP: 121/83 136/80  Pulse: 79 83  Resp: 19 19  Temp: (!) 97.4 F (36.3 C)   SpO2: 96% 94%  Controlled 3/9  10.  Atrial fibrillation.  Cardiac rate controlled.  Continue Toprol-xl 25 mg daily On Eliquis  11.  Hyperlipidemia.  Lipitor 20 mg daily 12.  Parotid mass, follow-up outpatient 13.  CKD 3a.    creat improved vs prior  Latest Ref Rng & Units 05/19/2022    8:48 AM 05/18/2022    4:54 AM 05/16/2022    6:25 AM  BMP  Glucose 70 - 99 mg/dL 182  127  118   BUN 8 - 23 mg/dL '23  18  27   '$ Creatinine 0.61 - 1.24 mg/dL 1.14  0.98  1.04   Sodium 135 - 145 mmol/L 134  137  138   Potassium 3.5 - 5.1 mmol/L 3.7  3.8  3.7   Chloride 98 - 111 mmol/L 99  104  107   CO2 22 - 32 mmol/L '25  22  21   '$ Calcium 8.9 - 10.3 mg/dL 9.2  9.1  8.7    14.  Insomnia- received trazodone,mirtazepine and melatonin last night. Still didn't sleep   May have mild sundowning , trial low dose seroquel 3/8 Q 3pm  3/9 reduce seroquel to 12.'5mg'$ , move dose back to 8pm   -continue remeron, d/c melatonin   -keep sleep-chart 15.  Brief episode of nystagmus - suspect peripheral cause will cont to monitor , has remote R cerebellar infarct on MRI but MRA not showing any sig VB disease LOS: 9 days A FACE TO FACE EVALUATION WAS PERFORMED  Meredith Staggers 05/27/2022, 10:16 AM

## 2022-05-27 NOTE — Progress Notes (Addendum)
Speech Language Pathology Daily Session Note  Patient Details  Name: MATI STELLWAGEN MRN: [id number] Date of Birth: November 07, 1933  Today's Date: 05/27/2022 SLP Individual Time: 0800-0900 SLP Individual Time Calculation (min): 60 min  Short Term Goals: Week 2: SLP Short Term Goal 1 (Week 2): Patient will consume dysphagia 3 textures and thin liquids with minimal overt s/sx of aspiration and with improved mastication effectiveness, oral clearance with use of lingual sweep with sup verbal cues for implementation of swallowing precautions SLP Short Term Goal 2 (Week 2): Patient will scan left of midline during functional tasks with mod A verbal cues to achieve 50% accuracy SLP Short Term Goal 3 (Week 2): Patient will complete medication management tasks with min-to-mod A verbal cues to achieve 70% accuracy SLP Short Term Goal 4 (Week 2): Patient will complete problem solving tasks with awareness of errors given min-to-mod A verbal cues to ID and repair  Skilled Therapeutic Interventions: Pt seen this date for skilled ST intervention targeting swallowing and cognitive goals outlined above. Pt received awake/alert and lying semi-reclined in bed with NT present attempting to set pt up for breakfast. Provided assistance to NT for transfer from bed to w/c via [facility], per safety plan protocol. Agreeable to intervention in hospital room. Continues to report "foggy" feeling and lethargy - MD notified at pt's request. Does appear slightly sad re: his current state s/p CVA. Poor intake noted - only consumed ~20% of AM meal before satiety.   SLP facilitated today's session by providing Min A verbal and visual cues for scanning to L during AM meal to locate drink and food items placed on this side, with repetition and orientation to these items, cues were successfully faded to Mod I. Pt adhered to aspiration precautions with set-up assistance. Pt asking appropriate questions re: aspiration precautions and how to not  "choke" when eating. Provided verbal education re: slow rate, small + single bites/sips, and sitting upright in bed/chair when eating - he verbalized understanding. Additionally, completed simple problem-solving task of counting bills and coins with Mod A verbal and visual cues to achieve 100% accuracy; unable to fully complete task without this level of intervention due to decreased attention, visual deficits, and decreased problem-solving and working memory. Benefited from re-education re: leaning behaviors and that this was a result of his stroke. Appears to be a little depressed re: his current debility; may benefit from neuropsych evaluation and/or follow-up - will notify CSW of observation. SLP providing encouragement and accolades for progress made thus far - pt appreciative of support.  Pt left in room and OOB in w/c with arm tray donned to support LUE.  Additionally, all safety measures activated, call bell within reach (placed on R), and all known immediate needs met. Continue per current ST POC.  Pain No pain reported this session; NAD  Therapy/Group: Individual Therapy  Cambell Stanek A Aswad Wandrey 05/27/2022, 9:28 AM

## 2022-05-28 MED ORDER — TRAMADOL HCL 50 MG PO TABS
25.0000 mg | ORAL_TABLET | Freq: Four times a day (QID) | ORAL | Status: DC | PRN
  Administered 2022-05-29 – 2022-06-05 (×10): 25 mg via ORAL
  Filled 2022-05-28 (×10): qty 1

## 2022-05-28 NOTE — Progress Notes (Signed)
PROGRESS NOTE   Subjective/Complaints: Had a good night of sleep. No new problems this morning. Just waking up when I came in d/t time change  Objective:   No results found. No results for input(s): "WBC", "HGB", "HCT", "PLT" in the last 72 hours. No results for input(s): "NA", "K", "CL", "CO2", "GLUCOSE", "BUN", "CREATININE", "CALCIUM" in the last 72 hours.  Intake/Output Summary (Last 24 hours) at 05/28/2022 0920 Last data filed at 05/28/2022 0743 Gross per 24 hour  Intake 480 ml  Output 1950 ml  Net -1470 ml        Physical Exam: Vital Signs Blood pressure 132/78, pulse 78, temperature 98.4 F (36.9 C), temperature source Oral, resp. rate 16, height 5' 11.5" (1.816 m), weight 93.8 kg, SpO2 94 %.    Constitutional: No distress . Vital signs reviewed. HEENT: NCAT, EOMI, oral membranes moist Neck: supple Cardiovascular: RRR without murmur. No JVD    Respiratory/Chest: CTA Bilaterally without wheezes or rales. Normal effort    GI/Abdomen: BS +, non-tender, non-distended Ext: no clubbing, cyanosis, or edema Psych: pleasant and cooperative  Skin: No evidence of breakdown, no evidence of rash  Neuro: Fairly alert/attentive  Eyes without evidence of nystagmus  Tone is normal without evidence of spasticity Cerebellar exam not tested due to weakness on L   3- to 3+/5 Left delt , bi , tri, 1/5 finger flexors and extensors  3- to 3/5 Left HF, KE, ADF --stable in appearance Sensory exam absent LT in LUE and LLE, senses pinch LUE   Musculoskeletal: Full range of motion in all 4 extremities. No joint swelling   Assessment/Plan: 1. Functional deficits which require 3+ hours per day of interdisciplinary therapy in a comprehensive inpatient rehab setting. Physiatrist is providing close team supervision and 24 hour management of active medical problems listed below. Physiatrist and rehab team continue to assess barriers to  discharge/monitor patient progress toward functional and medical goals  Care Tool:  Bathing    Body parts bathed by patient: Left arm, Right arm, Chest, Abdomen, Right upper leg, Left upper leg, Front perineal area   Body parts bathed by helper: Right arm, Left arm, Front perineal area, Buttocks, Left lower leg, Right lower leg     Bathing assist Assist Level: Total Assistance - Patient < 25%     Upper Body Dressing/Undressing Upper body dressing   What is the patient wearing?: Pull over shirt    Upper body assist Assist Level: Maximal Assistance - Patient 25 - 49%    Lower Body Dressing/Undressing Lower body dressing      What is the patient wearing?: Pants     Lower body assist Assist for lower body dressing: Total Assistance - Patient < 25%     Toileting Toileting Toileting Activity did not occur Landscape architect and hygiene only): N/A (no void or bm)  Toileting assist Assist for toileting: 2 Helpers     Transfers Chair/bed transfer  Transfers assist     Chair/bed transfer assist level: Maximal Assistance - Patient 25 - 49%     Locomotion Ambulation   Ambulation assist      Assist level: 2 helpers Assistive device: Other (comment) (Hemi-rail) Max  distance: 10'   Walk 10 feet activity   Assist     Assist level: 2 helpers Assistive device: Other (comment) (R hemi-rail)   Walk 50 feet activity   Assist Walk 50 feet with 2 turns activity did not occur: Safety/medical concerns (Patient unable to ambulate >10' at this time secondary to increased fatigue with poor endurance/activity tolerance)         Walk 150 feet activity   Assist Walk 150 feet activity did not occur: Safety/medical concerns         Walk 10 feet on uneven surface  activity   Assist Walk 10 feet on uneven surfaces activity did not occur: Safety/medical concerns         Wheelchair     Assist Is the patient using a wheelchair?: Yes Type of Wheelchair:  Manual    Wheelchair assist level: Contact Guard/Touching assist Max wheelchair distance: 150'    Wheelchair 50 feet with 2 turns activity    Assist        Assist Level: Contact Guard/Touching assist   Wheelchair 150 feet activity     Assist      Assist Level: Contact Guard/Touching assist   Blood pressure 132/78, pulse 78, temperature 98.4 F (36.9 C), temperature source Oral, resp. rate 16, height 5' 11.5" (1.816 m), weight 93.8 kg, SpO2 94 %.  Medical Problem List and Plan: 1. Functional deficits secondary to right large MCA infarction due to right M2 occlusion status post IR/revascularization as well as history of CVA 2015 with left-sided residual weakness status post loop recorder             -patient may  shower             -ELOS/Goals: 3/28, PT min A, OT min to mod A, SLP supervision           -Continue CIR therapies including PT, OT, and SLP               -Continue work on visual scanning as he appears to have a visual field cut on the left 2.  Antithrombotics: -DVT/anticoagulation:  Pharmaceutical: Eliquis             -antiplatelet therapy: N/A 3. Pain Management: Tylenol as needed 4. Mood/Behavior/Sleep: Provide emotional support             -antipsychotic agents: N/A  - sleep chart 5. Neuropsych/cognition: This patient is capable of making decisions on his own behalf. 6. Skin/Wound Care: Routine skin checks 7. Fluids/Electrolytes/Nutrition: Routine in and outs with follow-up chemistries 8.  Dysphagia.  Diet advanced to Dysphagia 3 thin liquids.    9.  Hypertension.  Norvasc 10 mg daily, Toprol-XL 25 mg daily.  Monitor with increased mobility.  Overall well-controlled.  Goal BP less than 180/105, long-term goal normotensive Vitals:   05/28/22 0422 05/28/22 0809  BP: 131/85 132/78  Pulse: 81 78  Resp: 18 16  Temp: 98.4 F (36.9 C)   SpO2: 93% 94%  Controlled 3/10  10.  Atrial fibrillation.  Cardiac rate controlled.  Continue Toprol-xl 25 mg  daily On Eliquis  11.  Hyperlipidemia.  Lipitor 20 mg daily 12.  Parotid mass, follow-up outpatient 13.  CKD 3a.    creat improved vs prior       Latest Ref Rng & Units 05/19/2022    8:48 AM 05/18/2022    4:54 AM 05/16/2022    6:25 AM  BMP  Glucose 70 - 99 mg/dL 182  127  118   BUN 8 - 23 mg/dL '23  18  27   '$ Creatinine 0.61 - 1.24 mg/dL 1.14  0.98  1.04   Sodium 135 - 145 mmol/L 134  137  138   Potassium 3.5 - 5.1 mmol/L 3.7  3.8  3.7   Chloride 98 - 111 mmol/L 99  104  107   CO2 22 - 32 mmol/L '25  22  21   '$ Calcium 8.9 - 10.3 mg/dL 9.2  9.1  8.7    14.  Insomnia- received trazodone,mirtazepine and melatonin last night. Still didn't sleep   May have mild sundowning , trial low dose seroquel 3/8 Q 3pm  3/9 reduced seroquel to 12.'5mg'$ , move dose back to 8pm   -continue remeron, d/c melatonin   -keep sleep-chart  3/10 appears to have had reasonable night--obsv how he does today 15.  Brief episode of nystagmus - suspect peripheral cause will cont to monitor , has remote R cerebellar infarct on MRI but MRA not showing any sig VB disease LOS: 10 days A FACE TO FACE EVALUATION WAS PERFORMED  Meredith Staggers 05/28/2022, 9:20 AM

## 2022-05-28 NOTE — Progress Notes (Signed)
This nurse called to room to help patient transfer to chair for grounds pass trip to visit with family in common area next to [employer]. Upon sitting up patients right shoulder audibly popped with pain to the right upper shoulder and down the arm. Patient able to external abduct and adduct the arm and raise the arm to 90 degrees with minimal pain at rest with slight increase with activity. Md on call notified and new orders for k-pad and prn tylenol administered. Patient in bed with three side rails up call light in reach plan of care in progress safety ensured.

## 2022-05-29 ENCOUNTER — Inpatient Hospital Stay (HOSPITAL_COMMUNITY): Payer: Medicare Other

## 2022-05-29 LAB — URINALYSIS, ROUTINE W REFLEX MICROSCOPIC
Bilirubin Urine: NEGATIVE
Glucose, UA: NEGATIVE mg/dL
Hgb urine dipstick: NEGATIVE
Ketones, ur: NEGATIVE mg/dL
Leukocytes,Ua: NEGATIVE
Nitrite: NEGATIVE
Protein, ur: NEGATIVE mg/dL
Specific Gravity, Urine: 1.017 (ref 1.005–1.030)
pH: 5 (ref 5.0–8.0)

## 2022-05-29 NOTE — Progress Notes (Signed)
PROGRESS NOTE   Subjective/Complaints:  onset of right shoulder pain onset during a transfer yesterday , no fall pain also involves bicep   ROS- neg CP, SOB, N/V/D Objective:   No results found. No results for input(s): "WBC", "HGB", "HCT", "PLT" in the last 72 hours. No results for input(s): "NA", "K", "CL", "CO2", "GLUCOSE", "BUN", "CREATININE", "CALCIUM" in the last 72 hours.  Intake/Output Summary (Last 24 hours) at 05/29/2022 0832 Last data filed at 05/29/2022 0737 Gross per 24 hour  Intake 417 ml  Output 2075 ml  Net -1658 ml         Physical Exam: Vital Signs Blood pressure (!) 143/84, pulse 79, temperature 98.1 F (36.7 C), temperature source Oral, resp. rate 18, height 5' 11.5" (1.816 m), weight 93.8 kg, SpO2 95 %.   General: No acute distress Mood and affect are appropriate Heart: Regular rate and rhythm no rubs murmurs or extra sounds Lungs: Clear to auscultation, breathing unlabored, no rales or wheezes Abdomen: Positive bowel sounds, soft nontender to palpation, nondistended Extremities: No clubbing, cyanosis, or edema Skin: No evidence of breakdown, no evidence of rash   Neuro: Fairly alert/attentive  Eyes without evidence of nystagmus  Tone is normal without evidence of spasticity Cerebellar exam not tested due to weakness on L   3- to 3+/5 Left delt , bi , tri, 2-/5 finger flexors and extensors  3- to 3/5 Left HF, KE, ADF --stable in appearance Sensory exam absent LT in LUE and LLE, senses pinch LUE   Musculoskeletal: pain with Right shoulder ROM, +  crepitus , No joint swelling   Assessment/Plan: 1. Functional deficits which require 3+ hours per day of interdisciplinary therapy in a comprehensive inpatient rehab setting. Physiatrist is providing close team supervision and 24 hour management of active medical problems listed below. Physiatrist and rehab team continue to assess barriers to  discharge/monitor patient progress toward functional and medical goals  Care Tool:  Bathing    Body parts bathed by patient: Left arm, Right arm, Chest, Abdomen, Right upper leg, Left upper leg, Front perineal area   Body parts bathed by helper: Right arm, Left arm, Front perineal area, Buttocks, Left lower leg, Right lower leg     Bathing assist Assist Level: Total Assistance - Patient < 25%     Upper Body Dressing/Undressing Upper body dressing   What is the patient wearing?: Pull over shirt    Upper body assist Assist Level: Maximal Assistance - Patient 25 - 49%    Lower Body Dressing/Undressing Lower body dressing      What is the patient wearing?: Pants     Lower body assist Assist for lower body dressing: Total Assistance - Patient < 25%     Toileting Toileting Toileting Activity did not occur Landscape architect and hygiene only): N/A (no void or bm)  Toileting assist Assist for toileting: 2 Helpers     Transfers Chair/bed transfer  Transfers assist     Chair/bed transfer assist level: Maximal Assistance - Patient 25 - 49%     Locomotion Ambulation   Ambulation assist      Assist level: 2 helpers Assistive device: Other (comment) (Hemi-rail) Max distance:  10'   Walk 10 feet activity   Assist     Assist level: 2 helpers Assistive device: Other (comment) (R hemi-rail)   Walk 50 feet activity   Assist Walk 50 feet with 2 turns activity did not occur: Safety/medical concerns (Patient unable to ambulate >10' at this time secondary to increased fatigue with poor endurance/activity tolerance)         Walk 150 feet activity   Assist Walk 150 feet activity did not occur: Safety/medical concerns         Walk 10 feet on uneven surface  activity   Assist Walk 10 feet on uneven surfaces activity did not occur: Safety/medical concerns         Wheelchair     Assist Is the patient using a wheelchair?: Yes Type of Wheelchair:  Manual    Wheelchair assist level: Contact Guard/Touching assist Max wheelchair distance: 150'    Wheelchair 50 feet with 2 turns activity    Assist        Assist Level: Contact Guard/Touching assist   Wheelchair 150 feet activity     Assist      Assist Level: Contact Guard/Touching assist   Blood pressure (!) 143/84, pulse 79, temperature 98.1 F (36.7 C), temperature source Oral, resp. rate 18, height 5' 11.5" (1.816 m), weight 93.8 kg, SpO2 95 %.  Medical Problem List and Plan: 1. Functional deficits secondary to right large MCA infarction due to right M2 occlusion status post IR/revascularization as well as history of CVA 2015 with left-sided residual weakness status post loop recorder             -patient may  shower             -ELOS/Goals: 3/28, PT min A, OT min to mod A, SLP supervision           -Continue CIR therapies including PT, OT, and SLP               -Continue work on visual scanning as he appears to have a visual field cut on the left 2.  Antithrombotics: -DVT/anticoagulation:  Pharmaceutical: Eliquis             -antiplatelet therapy: N/A 3. Pain Management: Tylenol as needed  RIght shoulder pain suspect rotator cuff strain vs aggravation of OA, check Xray  4. Mood/Behavior/Sleep: Provide emotional support             -antipsychotic agents: N/A  - sleep chart 5. Neuropsych/cognition: This patient is capable of making decisions on his own behalf. 6. Skin/Wound Care: Routine skin checks 7. Fluids/Electrolytes/Nutrition: Routine in and outs with follow-up chemistries 8.  Dysphagia.  Diet advanced to Dysphagia 3 thin liquids.    9.  Hypertension.  Norvasc 10 mg daily, Toprol-XL 25 mg daily.  Monitor with increased mobility.  Overall well-controlled.  Goal BP less than 180/105, long-term goal normotensive Vitals:   05/28/22 2008 05/29/22 0303  BP: 127/88 (!) 143/84  Pulse: 71 79  Resp: 18 18  Temp: 97.9 F (36.6 C) 98.1 F (36.7 C)  SpO2: 94%  95%  Controlled 3/11  10.  Atrial fibrillation.  Cardiac rate controlled.  Continue Toprol-xl 25 mg daily On Eliquis  11.  Hyperlipidemia.  Lipitor 20 mg daily 12.  Parotid mass, follow-up outpatient 13.  CKD 3a.    creat improved vs prior       Latest Ref Rng & Units 05/19/2022    8:48 AM 05/18/2022    4:54  AM 05/16/2022    6:25 AM  BMP  Glucose 70 - 99 mg/dL 182  127  118   BUN 8 - 23 mg/dL '23  18  27   '$ Creatinine 0.61 - 1.24 mg/dL 1.14  0.98  1.04   Sodium 135 - 145 mmol/L 134  137  138   Potassium 3.5 - 5.1 mmol/L 3.7  3.8  3.7   Chloride 98 - 111 mmol/L 99  104  107   CO2 22 - 32 mmol/L '25  22  21   '$ Calcium 8.9 - 10.3 mg/dL 9.2  9.1  8.7    14.  Insomnia- Mirtazepine and seroquel     15.  Brief episode of nystagmus - suspect peripheral cause will cont to monitor , has remote R cerebellar infarct on MRI but MRA not showing any sig VB disease LOS: 11 days A FACE TO FACE EVALUATION WAS PERFORMED  Charlett Blake 05/29/2022, 8:32 AM

## 2022-05-29 NOTE — Progress Notes (Signed)
Speech Language Pathology Daily Session Note  Patient Details  Name: Edward Maynard MRN: [id number] Date of Birth: 1933/10/15  Today's Date: 05/29/2022 SLP Individual Time: 1400-1500 SLP Individual Time Calculation (min): 60 min  Short Term Goals: Week 2: SLP Short Term Goal 1 (Week 2): Patient will consume dysphagia 3 textures and thin liquids with minimal overt s/sx of aspiration and with improved mastication effectiveness, oral clearance with use of lingual sweep with sup verbal cues for implementation of swallowing precautions SLP Short Term Goal 2 (Week 2): Patient will scan left of midline during functional tasks with mod A verbal cues to achieve 50% accuracy SLP Short Term Goal 3 (Week 2): Patient will complete medication management tasks with min-to-mod A verbal cues to achieve 70% accuracy SLP Short Term Goal 4 (Week 2): Patient will complete problem solving tasks with awareness of errors given min-to-mod A verbal cues to ID and repair  Skilled Therapeutic Interventions: Pt was seen in PM to address cognitive re- training and dysphagia management. Pt was alerted upon SLP arrival and seen at bedside. SLP addressed problem solving and medication management. Pt challenged to identify solutions to problems within current environment. Given scenarios presented verbally, pt identified solutions with 67% acc with mod A. Pt recalled 3 medications he took at home indep and rationale. SLP reviewed current medication list, rationale, and frequency. He was challenged to recall x1 new medication and rationale. Given a 3 minute delay, He recalled information with 50% acc with mod A. Pt continued to c/o difficulty raising arm thus med practice was not completed this date.   In additional minutes of session, SLP addressed dysphagia management. Pt presented with dys 3 and thin liquids. SLP repositioned pt upright and provided education regarding importance of positioning to facilitate airway protection. Pt  administered multiple large boluses requiring verbal cues for amount and rate modifications with good return. He consumed thin liquid via straw sips. X1 strong cough with watery eyes observed as pt attempted to talk during intake. Instruction and education completed to pt to limit distractions during meals and sips. No further s/sx pen/asp observed.  Pt was left at bedside with call button within reach and bed alarm active. SLP to continue POC.   Pain  No pain  Therapy/Group: Individual Therapy  Colin Benton 05/29/2022, 2:57 PM

## 2022-05-29 NOTE — Progress Notes (Signed)
Physical Therapy Session Note  Patient Details  Name: Edward Maynard MRN: [id number] Date of Birth: 19-Oct-1933  Today's Date: 05/29/2022 PT Individual Time: 0904-1001 PT Individual Time Calculation (min): 57 min   Short Term Goals: Week 1:  PT Short Term Goal 1 (Week 1): Patient will perform bed mobility with MinA PT Short Term Goal 2 (Week 1): Patient will perform bed/chair transfer with LRAD and ModA PT Short Term Goal 3 (Week 1): Patient will ambulate 89' with LRAD and MaxA x1  Skilled Therapeutic Interventions/Progress Updates:    Discussed Rt shoulder restrictions with MD, Kirsteins; specified in chat for pt Rt shoulder restrictions to be no Abd/Flex of shoulder >45 degrees at this time. Will await imaging results. Pt received resting in supine and c/o Rt shoulder pain; expressing great concern about ability to progress mobility and mobilize, with encouragement pt agreeable to therapy. Pt was able to lift Bil LE's for donning pants in supine, therapist placed pant leg on respective LE. Pt able to flex bil knee and completed multiple bridges with therapist blocking feet from sliding on bed for tech to pull bottoms up. Pt unable to maintain bridge for long enough duration with enough clearance to fully don pants and completed roll Rt/Lt to pull pants up. Max assist provided for clothing for time management. Pt able to initiate bringing LE's off EOB with cues and Mod assist to place Lt UE on bed rail. Mod assist to sit up to EOB fully and use of bed pad to scoot hips and place feet on floor. Pt able to place Rt hand on low back and on top of head; able to flex/ext at elbow. ROM not testing into plan beyond 45* for flex. abduction. Pt completed 5x sit<>stand from EOB with good use of Rt UE to power up, +2 Mod assist, intermittent Max assist on Lt side to deter Lt lateral lean. Pt performed small lateral steps at EOB, 3x towards Rt; pt with great difficulty coordinating Lt foot placement and max assist  needed from therapist to position prior to attempted step with Rt LE. Pt unable to maintain midline posture with Rt stepping and returned to sit EOB on each attempt. EOS pt returned to supine with Mod assist to control trunk and bring LE's into bed. Completed exercises for Lt LE in supine with pt performing 1x10 SLR with manual resistance into flex/ext of LE with resistance into D1 extension on each rep. EOS pt resting in bed, alarm on, call bell in reach, and no NT at bedside to assist with breakfast for full supervision and assist.    Therapy Documentation Precautions:  Precautions Precautions: Fall Precaution Comments: Left hemi; Left homonymous hemianopsia Restrictions Weight Bearing Restrictions: No    Therapy/Group: Individual Therapy    Verner Mould, DPT Acute Rehabilitation Services Office [phone]  05/29/22 7:59 AM

## 2022-05-29 NOTE — Progress Notes (Signed)
Occupational Therapy Session Note  Patient Details  Name: Edward Maynard MRN: [id number] Date of Birth: 1934-01-02  Today's Date: 05/29/2022 OT Individual Time: 1049-1200 OT Individual Time Calculation (min): 71 min    Short Term Goals: Week 1:  OT Short Term Goal 1 (Week 1): Pt will maintain dyanmic sitting balance with CGA during BADLs/functional activities OT Short Term Goal 1 - Progress (Week 1): Met OT Short Term Goal 2 (Week 1): Pt will located 5/5 items on L side of sink/tray/table with min cueing OT Short Term Goal 2 - Progress (Week 1): Progressing toward goal OT Short Term Goal 3 (Week 1): Pt will complete UB dressing min A OT Short Term Goal 3 - Progress (Week 1): Progressing toward goal OT Short Term Goal 4 (Week 1): Pt will self-feed with min A OT Short Term Goal 4 - Progress (Week 1): Progressing toward goal Week 2:  OT Short Term Goal 1 (Week 2): Pt will complete UB bathing min A OT Short Term Goal 2 (Week 2): Pt will attend to L visual field/environment/body during BADLs/functional tasks with min cueing  Skilled Therapeutic Interventions/Progress Updates:     Pt received sitting up in bed presenting to be in good spirits and receptive to skilled OT session with a focus on LUE NMR, dynamic sitting balance, Pt education, and increasing Pt awareness of L side. Pt dressed and ready for the day upon OT arrival politely denying need for BADLs.   Pt reporting mild un-rated pain in R shoulder with OT offering repositioning and rest breaks to decrease pain and support participation. Pt perseverating on R shoulder injury and decreased use at beginning of session with OT providing therapeutic support and listening with noted improvement in moral and pt able to be redirected to functional tasks following.   Pt resting hand splint in room upon OT arrival. Provided education on purpose and use of L resting hand splint with handout placed above Pt HOB to support inter professional  communication.   Pt transitioned to EOB with min A with HOB elevated using bed features and max cueing for technique. Sitting EOB, Pt able to maintain static sitting balance with BLEs support on floor and no UE support with supervision. Pt participated in series of functional activities sitting EOB to work on dynamic sitting balance, increase use of LUE and increase Pt awareness of L hemibody/visual field. Pt completed functional reaching and grasp/release activity with visual scanning incorporated. Pt instructed to removed specific color squigz from tray table using LUE with Pt noted to have improved finger and wrist extension. Max VB cueing provided to facilitate finger extension and maintain tight grasp when pulling squigz off table. Pt the instructed to place squigz in therapist hand presented in L visual filed at eye level or higher. Min cueing required to locate therapist hand and squigz demonstrating mild improvement to L visual field. Pt then completed functional grasp and release activities using familiar items of sponge and cup to facilitate increased finger extension and tight grasp with noted improvement. Pt able to move items from R side of table to L side with mod VB and tactile cueing provided for muscle control, target reach, and increased muscle activation.   Pt friends from church entering room during session providing Pt with therapeutic support and prayers. Pt becoming tearful speaking with friends with OT providing therapeutic support, listening, and motivation with noted improvement in moral following.   Pt transitioned back to bed mod A to lift legs with HOB  elevated. Pt left resting in bed with call bell in reach, bed alarm on, and all needs met. Re-educated Pt on call bell use at end of session with Pt demonstrating teach back as evidence of learning.    Therapy Documentation Precautions:  Precautions Precautions: Fall Precaution Comments: Left hemi; Left homonymous  hemianopsia Restrictions Weight Bearing Restrictions: No General:     ADL: ADL Eating: Moderate assistance Where Assessed-Eating: Bed level Grooming: Moderate assistance Where Assessed-Grooming: Edge of bed Upper Body Bathing: Moderate assistance, Maximal assistance Where Assessed-Upper Body Bathing: Edge of bed Lower Body Bathing: Maximal assistance Where Assessed-Lower Body Bathing: Edge of bed Upper Body Dressing: Moderate assistance, Maximal assistance Where Assessed-Upper Body Dressing: Edge of bed Lower Body Dressing: Maximal assistance Where Assessed-Lower Body Dressing: Edge of bed Toileting: Dependent, Maximal assistance Where Assessed-Toileting: Bed level Toilet Transfer: Dependent, Maximal assistance (stedy, able to pull self to standing; not safety to atempt squat pivot or ambulate to bathroom) Toilet Transfer Equipment: Bedside commode Tub/Shower Transfer: Not assessed Social research officer, government: Dependent, Maximal assistance (stedy) Social research officer, government Method: Other (comment) (stedy) Walk-In Shower Equipment: Radio broadcast assistant   Therapy/Group: Individual Therapy  Janey Genta 05/29/2022, 7:49 AM

## 2022-05-30 LAB — BASIC METABOLIC PANEL
Anion gap: 10 (ref 5–15)
BUN: 22 mg/dL (ref 8–23)
CO2: 25 mmol/L (ref 22–32)
Calcium: 8.9 mg/dL (ref 8.9–10.3)
Chloride: 104 mmol/L (ref 98–111)
Creatinine, Ser: 1.26 mg/dL — ABNORMAL HIGH (ref 0.61–1.24)
GFR, Estimated: 56 mL/min — ABNORMAL LOW (ref >60)
Glucose, Bld: 93 mg/dL (ref 70–99)
Potassium: 3.8 mmol/L (ref 3.5–5.1)
Sodium: 139 mmol/L (ref 135–145)

## 2022-05-30 LAB — CBC WITH DIFFERENTIAL/PLATELET
Abs Immature Granulocytes: 0.04 10*3/uL (ref 0.00–0.07)
Basophils Absolute: 0.1 10*3/uL (ref 0.0–0.1)
Basophils Relative: 1 %
Eosinophils Absolute: 0.4 10*3/uL (ref 0.0–0.5)
Eosinophils Relative: 5 %
HCT: 43 % (ref 39.0–52.0)
Hemoglobin: 15.2 g/dL (ref 13.0–17.0)
Immature Granulocytes: 1 %
Lymphocytes Relative: 20 %
Lymphs Abs: 1.5 10*3/uL (ref 0.7–4.0)
MCH: 34.3 pg — ABNORMAL HIGH (ref 26.0–34.0)
MCHC: 35.3 g/dL (ref 30.0–36.0)
MCV: 97.1 fL (ref 80.0–100.0)
Monocytes Absolute: 0.8 10*3/uL (ref 0.1–1.0)
Monocytes Relative: 11 %
Neutro Abs: 4.7 10*3/uL (ref 1.7–7.7)
Neutrophils Relative %: 62 %
Platelets: 149 10*3/uL — ABNORMAL LOW (ref 150–400)
RBC: 4.43 MIL/uL (ref 4.22–5.81)
RDW: 13.2 % (ref 11.5–15.5)
WBC: 7.5 10*3/uL (ref 4.0–10.5)
nRBC: 0 % (ref 0.0–0.2)

## 2022-05-30 NOTE — NC FL2 (Signed)
[place] LEVEL OF CARE FORM     IDENTIFICATION  Patient Name: Edward Maynard Birthdate: [birth year] Sex: male Admission Date (Current Location): 05/18/2022  [facility] and [place] Number:  Herbalist and Address:  [facility] [place], [place] [address] [place], [place] [address]      Provider Number:    Attending Physician Name and Address:  Charlett Blake, MD  Relative Name and Phone Number:  Isaias Cowman    Current Level of Care: Hospital Recommended Level of Care: [place] Prior Approval Number:    Date Approved/Denied:   PASRR Number: [id number]  Discharge Plan: SNF    Current Diagnoses: Patient Active Problem List   Diagnosis Date Noted   CKD (chronic kidney disease) 05/18/2022   Insomnia 05/18/2022   Right middle cerebral artery stroke ([place]) 05/18/2022   Essential hypertension 05/16/2022   Obesity 05/16/2022   Advanced age 89/27/2024   Dysphagia 05/16/2022   Stroke (cerebrum) ([place]) 05/10/2022   Middle cerebral artery embolism, 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   Chronic a-fib ([place]) 05/10/2022   Paroxysmal atrial fibrillation ([place]) 01/18/2015   Chronic anticoagulation 01/18/2015   PVC's (premature ventricular contractions) 12/14/2014   NSVT (nonsustained ventricular tachycardia) ([place]) 12/14/2014   Cerebral infarction due to embolism of cerebral artery ([place]) 07/10/2014   Hyperlipidemia 12/12/2013   Personal history of colonic polyp - adenoma  10/23/2013    Orientation RESPIRATION BLADDER Height & Weight     Self, Place, Situation  Normal Incontinent Weight: 206 lb 12.7 oz (93.8 kg) Height:  5' 11.5" (181.6 cm)  BEHAVIORAL SYMPTOMS/MOOD NEUROLOGICAL BOWEL NUTRITION STATUS      Incontinent Diet (d3, thin liq)  AMBULATORY STATUS COMMUNICATION OF NEEDS Skin   Limited Assist Verbally Other (Comment) (Bruises to left arm/hip with abrasion to left knee.(Foam in place))                       Personal Care  Assistance Level of Assistance  Bathing, Feeding, Dressing Bathing Assistance: Limited assistance Feeding assistance: Limited assistance Dressing Assistance: Limited assistance     Functional Limitations Info  Sight Sight Info: Impaired        SPECIAL CARE FACTORS FREQUENCY  PT (By licensed PT), OT (By licensed OT), Speech therapy     PT Frequency: 5x/week OT Frequency: 5x/week     Speech Therapy Frequency: 5x/week      Contractures Contractures Info: Not present    Additional Factors Info  Code Status Code Status Info: FULL             Current Medications (05/30/2022):  This is the current hospital active medication list Current Facility-Administered Medications  Medication Dose Route Frequency Provider Last Rate Last Admin   acetaminophen (TYLENOL) tablet 650 mg  650 mg Oral Q4H PRN Cathlyn Parsons, PA-C   650 mg at 05/29/22 2035   Or   acetaminophen (TYLENOL) 160 MG/5ML solution 650 mg  650 mg Per Tube Q4H PRN Angiulli, Lavon Paganini, PA-C       Or   acetaminophen (TYLENOL) suppository 650 mg  650 mg Rectal Q4H PRN Cathlyn Parsons, PA-C   650 mg at 05/28/22 2030   amLODipine (NORVASC) tablet 10 mg  10 mg Oral Daily Cathlyn Parsons, PA-C   10 mg at 05/30/22 0850   apixaban (ELIQUIS) tablet 5 mg  5 mg Oral BID Cathlyn Parsons, PA-C   5 mg at 05/30/22 0851   atorvastatin (LIPITOR)  tablet 20 mg  20 mg Oral Daily Cathlyn Parsons, PA-C   20 mg at 05/30/22 0850   bisacodyl (DULCOLAX) suppository 10 mg  10 mg Rectal Daily PRN Cathlyn Parsons, PA-C   10 mg at 05/25/22 2059   camphor-menthol (SARNA) lotion   Topical PRN Cathlyn Parsons, PA-C   Given at 05/30/22 0302   docusate sodium (COLACE) capsule 100 mg  100 mg Oral Daily Cathlyn Parsons, PA-C   100 mg at 05/30/22 0851   feeding supplement (ENSURE ENLIVE / ENSURE PLUS) liquid 237 mL  237 mL Oral TID BM AngiulliLavon Paganini, PA-C   237 mL at 05/30/22 1338   hydrOXYzine (ATARAX) tablet 10 mg  10 mg Oral Q6H  PRN Charlett Blake, MD   10 mg at 05/28/22 1950   metoprolol succinate (TOPROL-XL) 24 hr tablet 25 mg  25 mg Oral Daily Cathlyn Parsons, PA-C   25 mg at 05/30/22 0850   mirtazapine (REMERON SOL-TAB) disintegrating tablet 15 mg  15 mg Oral QHS Charlett Blake, MD   15 mg at 05/29/22 2122   QUEtiapine (SEROQUEL) tablet 12.5 mg  12.5 mg Oral QPM Meredith Staggers, MD   12.5 mg at 05/29/22 2035   senna-docusate (Senokot-S) tablet 1 tablet  1 tablet Oral QHS PRN Cathlyn Parsons, PA-C   1 tablet at 05/25/22 2058   traMADol (ULTRAM) tablet 25 mg  25 mg Oral Q6H PRN [address], [place], [place]   25 mg at 05/29/22 2035     Discharge Medications: Please see discharge summary for a list of discharge medications.  Relevant Imaging Results:  Relevant Lab Results:   Additional Information [id number]  Dyanne Iha

## 2022-05-30 NOTE — Progress Notes (Signed)
Nutrition Follow-up  DOCUMENTATION CODES:   Not applicable  INTERVENTION:  - Continue Ensure Enlive po TID, each supplement provides 350 kcal and 20 grams of protein.   NUTRITION DIAGNOSIS:   No nutrition diagnosis.    REASON FOR ASSESSMENT:   Malnutrition Screening Tool    ASSESSMENT:   89 y.o. male admits to CIR related to functional deficits in setting of right middle cerebral artery stroke. PMH includes: high cholesterol and HLD.  Meds reviewed: lipitor, colace, remeron. Labs reviewed.   Pt has been eating well since last assessment. He ate 75% of his lunch today. Most of his intakes have been 45-100%. The pt is also taking Ensure supplements as well. RD will continue to monitor PO intakes.   Diet Order:   Diet Order             DIET DYS 3 Room service appropriate? Yes; Fluid consistency: Thin  Diet effective now                   EDUCATION NEEDS:      Skin:  Skin Assessment: Reviewed RN Assessment  Last BM:  3/8 - type 6  Height:   Ht Readings from Last 1 Encounters:  05/18/22 5' 11.5" (1.816 m)    Weight:   Wt Readings from Last 1 Encounters:  05/25/22 93.8 kg    Ideal Body Weight:     BMI:  Body mass index is 28.44 kg/m.  Estimated Nutritional Needs:   Kcal:  [id number] kcals  Protein:  115-140 gm  Fluid:  >/= 2.3 L  Thalia Bloodgood, RD, LDN, CNSC.

## 2022-05-30 NOTE — Progress Notes (Signed)
Speech Language Pathology Daily Session Note  Patient Details  Name: Edward Maynard MRN: [id number] Date of Birth: June 21, 1933  Today's Date: 05/30/2022 SLP Individual Time: 1115-1200 SLP Individual Time Calculation (min): 45 min  Short Term Goals: Week 2: SLP Short Term Goal 1 (Week 2): Patient will consume dysphagia 3 textures and thin liquids with minimal overt s/sx of aspiration and with improved mastication effectiveness, oral clearance with use of lingual sweep with sup verbal cues for implementation of swallowing precautions SLP Short Term Goal 2 (Week 2): Patient will scan left of midline during functional tasks with mod A verbal cues to achieve 50% accuracy SLP Short Term Goal 3 (Week 2): Patient will complete medication management tasks with min-to-mod A verbal cues to achieve 70% accuracy SLP Short Term Goal 4 (Week 2): Patient will complete problem solving tasks with awareness of errors given min-to-mod A verbal cues to ID and repair  Skilled Therapeutic Interventions:  Pt was seen in am to address cognitive re- training and dysphagia management. Pt was alert and seen at bedside upon SLP arrival. PT reported pt had concerns regarding swallowing. He complained of a "hung" feeling in his throat that was not associated with PO intake. Pt repoted water helped to relive symptom occasionally. Pt confirmed occasional globus sensation with PO intake. Pt was presented with thin liquid trials via straw sip. SLP instructed pt in taking small single sips with good return and no s/sx pen/asp. Prior to session conclusion. SLP assisted pt in placing dentures. Pt recalled strategies for upright positioning indep.  SLP addressed cognition via medication management. Pt appeared fatigued and required frequent re-alerting cues throughout session. Pt was challenged to identify solutions to common problems. Pt required max A cues for identifying solutions. SLP provided correct response and rationale in all  missed opportunities. Severity of impairment this date possibly 2/2 increased fatigue. SLP challenged pt to complete pill box. Pt completed tasks requiring max A for completion and awareness to each pill box. During tasks, SLP simultaneously addressing visual scanning to left side. Instruction for lighthouse scanning requiring mod A cues which could not be faded. Pt left at bedside with call button within reach and bed alert active. SLP to continue POC.   Pain No Pain  Therapy/Group: Individual Therapy  Colin Benton 05/30/2022, 11:57 AM

## 2022-05-30 NOTE — Progress Notes (Addendum)
Patient ID: Edward Maynard, male   DOB: [birth year], 89 y.o.   MRN: [id number]  Sw spoke with patient's daughter, Edward Maynard. Sw provided team conference updates from this past week and informed daughter she would receive another call tomorrow for additional updates this week. Currently daughter, son and SIL do not feel confident in taking the patient home based on what they are seeing currently. Daughter mentioned having patient placed at d/c but agreeable to completing family educating before making decision. Daughter and SIL will complete family education on Saturday 3/16 9a-12p to determine if they are able to support the patient at home. If daughter and SIL feel as they are unable daughter will reach out to SW to discuss placement. SW provided SNF resources to daughter just in case at [email or web address]. No additional questions or concerns.

## 2022-05-30 NOTE — Progress Notes (Signed)
Physical Therapy Weekly Progress Note  Patient Details  Name: Edward Maynard MRN: [id number] Date of Birth: 1934/02/02  Beginning of progress report period: May 19, 2022 End of progress report period: May 30, 2022  Today's Date: 05/30/2022 PT Individual Time: 1050-1116 and 1350-1500 PT Individual Time Calculation (min): 26 min and 70 min  Patient has met 1 of 3 short term goals. Edward Maynard is progressing well with therapy demonstrating increasing independence with functional mobility and increasing awareness and motor planning. Pt continues to be most limited by L inattention and lack of sensation/proprioceptive in L hemibody. He is performing supine<>sit using bed features with mod assist, sit<>stands min/mod assist, and squat pivot transfers with mod assist. Pt is participating in gait training using +2 R HHA and L UE support over therapist's shoulders with +2 mod assist for balance and pt requiring total assist to manage L LE positioning/placement due to impaired sensation. Pt with sudden onset R shoulder pain/weakness on Sunday 3/9 now resulting in decreased ability to compensate for L hemibody impairments causing pt to require increased assistance for mobility. Pt will benefit from continued CIR level skilled physical therapy to further progress his independence with functional mobility and decrease caregiver burden.   Patient continues to demonstrate the following deficits muscle weakness and muscle joint tightness, decreased cardiorespiratoy endurance, impaired timing and sequencing, unbalanced muscle activation, decreased coordination, and decreased motor planning, decreased midline orientation and decreased attention to left, decreased attention, decreased awareness, decreased problem solving, decreased safety awareness, decreased memory, and delayed processing, and decreased sitting balance, decreased standing balance, decreased postural control, hemiplegia, and decreased balance strategies  and therefore will continue to benefit from skilled PT intervention to increase functional independence with mobility.  Patient progressing toward long term goals..  Continue plan of care.  PT Short Term Goals Week 1:  PT Short Term Goal 1 (Week 1): Patient will perform bed mobility with MinA PT Short Term Goal 1 - Progress (Week 1): Progressing toward goal PT Short Term Goal 2 (Week 1): Patient will perform bed/chair transfer with LRAD and ModA PT Short Term Goal 2 - Progress (Week 1): Met PT Short Term Goal 3 (Week 1): Patient will ambulate 78' with LRAD and MaxA x1 PT Short Term Goal 3 - Progress (Week 1): Progressing toward goal Week 2:  PT Short Term Goal 1 (Week 2): Pt will perform bed mobility with min assist PT Short Term Goal 2 (Week 2): Pt will perform bed<>chair transfers using LRAD with min assist PT Short Term Goal 3 (Week 2): Pt will perform sit<>stand transfers min assist using LRAD PT Short Term Goal 4 (Week 2): Pt will ambulate at least 49f using LRAD with mod assist of 1 and +2 for safety  Skilled Therapeutic Interventions/Progress Updates:  Ambulation/gait training;Community reintegration;DME/adaptive equipment instruction;Neuromuscular re-education;Psychosocial support;Stair training;UE/LE Strength taining/ROM;Wheelchair propulsion/positioning;Balance/vestibular training;Discharge planning;Functional electrical stimulation;Pain management;UE/LE Coordination activities;Therapeutic Activities;Cognitive remediation/compensation;Disease management/prevention;Functional mobility training;Patient/family education;Splinting/orthotics;Therapeutic Exercise;Visual/perceptual remediation/compensation;Skin care/wound management   Session 1: Pt received sitting in w/c asleep, but easily awakens and is agreeable to therapy session. Pt already wearing L LE ankle ASO/soft ankle brace.  Of note: pt has lost a significant amount of R shoulder abduction and flexion AROM against gravity with pt  reporting pain stating this happened sudden onset during mobility with nursing staff on Sunday - pt having to use compensatory strategies of elbow flexion to lift R LE to even touch the R side of his head - this is a significant change compared to the WNL/WFL  AROM in his R UE he had last week when last seen by this therapist.  R squat pivot transfer w/c>EOB with therapist cuing pt to hold onto therapist's shirt with R UE to prevent pt from trying to pull on bedrail and further cause pain/injury to R UE - cuing for increased anterior trunk lean and to lift hips using B LEs - pt initiates coming to partial stand rather than true squat pivot and is able to rotate hips safely with heavy mod assist. Pt reports he feels need to have BM but cannot void.  L stand pivot EOB>BSC (had to perform stand pivot due to not having drop-arm BSC) with R UE support on therapist's shirt and heavy mod assist for lifting/balance due to strong L lean - therapist manually facilitating L LE positioning/placement as pt stepped in that direction to complete transfer due to pt having impaired sensation. +2 assist present for safety and to perform dependent LB clothing management.   Pt able to void bladder but not bowels - nurse present and aware and planning to provide pt medication - dependent peri-care for cleanliness. Sit>stand BSC>R UE support on therapist with mod assist for lifting/balance due to L lean bias. R stand pivot back to EOB with heavy mod assist of 1 for balance and pivoting hips (+2 present for safety).   Sit>supine via reverse logroll technique to increase pt independence with mod assist for B LE management into the bed. Pt left supine in bed with needs in reach, bed alarm on, and SLP present to assume care of patient.   Session 2: Pt received supine in bed awake and agreeable to therapy session. Supine>sitting R EOB, HOB slightly elevated, via logroll technique to increase pt independence with assist for B LE  management off EOB and heavy mod assist to bring trunk upright due to pt being unable to push up with R UE due to pain and decreased strength.  Sitting EOB with supervision for trunk control during dependent donning of shoes and L LE ASO.  L squat pivot transfer EOB>w/c with mod assist for lifting/pivoting hips - cuing for increased R anterior trunk lean and pushing up through B LEs to lift hips while holding onto therapist with his R hand to prevent R shoulder pain during transfer from pt attempting to push with it too much.   Transported to/from gym in w/c for time management and energy conservation.  Sit<>stands w/c<>R HHA from +2 with light min assist to rise to standing progressed to up to mod assist for balance once in standing due to strong L anterior trunk lean with lack of sufficient/sustained L knee extension activation (knee slowly bends causing LOB in that direction).  Donned L LE leg strap to utilize for swing phase facilitation/foot placement during gait training.  Gait training 85f + 556f+ [id number] R HHA from +2 and L UE around therapist with min assist from +2 and heavy mod assist from therapist on the L side for balance and L LE swing advancement control - +3rd person w/c follow on final 2 walks to allow increased distance, otherwise was able to turn and sit on mat with increased assistance for balance due to worsening L lean. Pt demonstrating the following gait deviations with therapist providing the described cuing and facilitation for improvement:  - requires total assist to advance/place L LE during swing phase using leg strap due to lack of sensation/proprioception - pt has active muscle movement to lift leg and  bring it forward but it is uncoordinated in a flexor synergy pattern with excessive hip external rotation with adduction placing ankle at risk for inversion rolling or he kicks it too far forward outside his BOS - this improves significantly by final gait  trail only requiring heavy mod assist to position L LE correctly  - continues to require max L knee block during stance phase ~85-90% of the time due to lack of sensation telling him the need to extend it, when not blocking pt does truly buckle, but causes worse L lean - continues to have heavy L lateral lean with fatigue or when his motor planning breaks down  Sit<>stand NMR retraining with +2 assist standing directly in front of him for visual target of upright, midline posture with pt able to maintain static standing ~10seconds with light min assist  - he does have slight trunk rotation towards R, which is pt's attempt to weight shift in that direction - with visual feedback does much better with sustaining L knee extension to maintain midline.  Transported back to room. R squat pivot w/c>EOB with mod assist for lifting/pivoting hips in that direction now due to pt being unable to use R UE to assist with this movement. Sit>supine light mod assist for B LE management into bed. Pt left supine in bed with needs in reach, bed alarm on, and L UE supported on pillow.   Therapy Documentation Precautions:  Precautions Precautions: Fall Precaution Comments: Left hemi; Left homonymous hemianopsia Restrictions Weight Bearing Restrictions: No   Pain:  Session 1: Reports R shoulder pain from event on Sunday - pt with very limited active shoulder flexion/abduction AROM to move his arm away from his side therefore modified how pt placed R arm during transfers (holding onto therapist) to prevent further pain or injury.  Session 2: Continues to have R shoulder pain - modified interventions for pain management as described above.   Therapy/Group: Individual Therapy  Tawana Scale , PT, DPT, NCS, CSRS 05/30/2022, 8:01 AM

## 2022-05-30 NOTE — Progress Notes (Signed)
Occupational Therapy Session Note  Patient Details  Name: Edward Maynard MRN: [id number] Date of Birth: Jan 27, 1934  Today's Date: 05/30/2022 OT Individual Time: 0902-1003 OT Individual Time Calculation (min): 61 min    Short Term Goals: Week 1:  OT Short Term Goal 1 (Week 1): Pt will maintain dyanmic sitting balance with CGA during BADLs/functional activities OT Short Term Goal 1 - Progress (Week 1): Met OT Short Term Goal 2 (Week 1): Pt will located 5/5 items on L side of sink/tray/table with min cueing OT Short Term Goal 2 - Progress (Week 1): Progressing toward goal OT Short Term Goal 3 (Week 1): Pt will complete UB dressing min A OT Short Term Goal 3 - Progress (Week 1): Progressing toward goal OT Short Term Goal 4 (Week 1): Pt will self-feed with min A OT Short Term Goal 4 - Progress (Week 1): Progressing toward goal Week 2:  OT Short Term Goal 1 (Week 2): Pt will complete UB bathing min A OT Short Term Goal 2 (Week 2): Pt will attend to L visual field/environment/body during BADLs/functional tasks with min cueing  Skilled Therapeutic Interventions/Progress Updates:     Pt received sitting up in bed presenting to be in good spirits and receptive to skilled OT session with A focus on LUE usage/awareness, dynamic sitting balance, and functional transfer training within BADl re-education. Pt transitioned to EOB using bed features with min A and max VB/tactile cueing for technique and safety to prevent further injury to RUE. Pt able to maintain static sitting balance with supervision with BLEs supported on floor. Pt noted to be unaware of LUE placement during supine>EOB transfer requiring mod to attention to LUE. Pt doffed shirt seated EOB with min VB cueing for single hand technique. Facilitated bathing task with LUE incorporated with Pt providing self HOH assist and OT providing additional HOH A to ensure adequate pressure and control. All bathing and toiletry items placed in pt L visual  field with Pt requiring min cueing to locate and recall location demonstrating increased attention to L visual field. Pt presenting with LUE motor planning challenges with uncoordinated movements however, however increased muscle activation at wrist/finger extensors noted with improved grasp/release. Pt donned shirt with max A using hemi dressing technique. Pants donned max A to weave feet, however Pt actively participating in task and anteriorly weight shifting and reaching towards ground to assist. Pt completed sit<>stand with mod HHA and mod a to maintain standing balance with OT bringing Pt pants to waist. Donned Pt socks, L brace, and shoes max A for time management. Pt completed squat pivot transfer to R side mod A with max cueing provided. Pt completed oral care at sink with mod A and OT facilitating anterior weight shifting from wc when reaching for items. Applied K-tape to Pt LUE at end of session to support edema management with education on purpose provided. Pt was left resting in wc with call bell in reach, seat belt alarm on, and all needs met with LUE supported on tray table/pillow.    Therapy Documentation Precautions:  Precautions Precautions: Fall Precaution Comments: Left hemi; Left homonymous hemianopsia Restrictions Weight Bearing Restrictions: No General:   Vital Signs: Therapy Vitals Pulse Rate: 66 Resp: 18 BP: 104/82 Patient Position (if appropriate): Lying Oxygen Therapy SpO2: 93 % O2 Device: Room Air Pain: Pain Assessment Pain Scale: 0-10 Pain Score: 0-No pain  Therapy/Group: Individual Therapy  Janey Genta 05/30/2022, 12:29 PM

## 2022-05-30 NOTE — Progress Notes (Signed)
PROGRESS NOTE   Subjective/Complaints:  Reviewed xrays with pt, pain has improved but still with limited ROM  ROS- neg CP, SOB, N/V/D Objective:   DG Shoulder Right  Result Date: 05/29/2022 CLINICAL DATA:  [id number] Acute pain of right shoulder [id number] EXAM: RIGHT SHOULDER - 2+ VIEW COMPARISON:  None Available. FINDINGS: There is no evidence of acute fracture. Alignment is normal. There is moderate glenohumeral and AC joint osteoarthritis. High-riding humeral head with diminished acromial humeral interval. IMPRESSION: No evidence of acute fracture or dislocation. Moderate glenohumeral and AC joint osteoarthritis. High-riding humeral head suggesting chronic distal rotator cuff disease. Electronically Signed   By: Maurine Simmering M.D.   On: 05/29/2022 16:22   Recent Labs    05/30/22 0606  WBC 7.5  HGB 15.2  HCT 43.0  PLT 149*   Recent Labs    05/30/22 0606  NA 139  K 3.8  CL 104  CO2 25  GLUCOSE 93  BUN 22  CREATININE 1.26*  CALCIUM 8.9    Intake/Output Summary (Last 24 hours) at 05/30/2022 0814 Last data filed at 05/30/2022 0428 Gross per 24 hour  Intake 480 ml  Output 850 ml  Net -370 ml         Physical Exam: Vital Signs Blood pressure (!) 147/84, pulse 72, temperature 98 F (36.7 C), resp. rate 17, height 5' 11.5" (1.816 m), weight 93.8 kg, SpO2 93 %.   General: No acute distress Mood and affect are appropriate Heart: Regular rate and rhythm no rubs murmurs or extra sounds Lungs: Clear to auscultation, breathing unlabored, no rales or wheezes Abdomen: Positive bowel sounds, soft nontender to palpation, nondistended Extremities: No clubbing, cyanosis, or edema Skin: No evidence of breakdown, no evidence of rash   Neuro: Fairly alert/attentive  Eyes without evidence of nystagmus  Tone is normal without evidence of spasticity Cerebellar exam not tested due to weakness on L   3- to 3+/5 Left delt , bi ,  tri, 2-/5 finger flexors and extensors  3- to 3/5 Left HF, KE, ADF --stable in appearance Sensory exam absent LT in LUE and LLE, senses pinch LUE   Musculoskeletal: pain with Right shoulder ROM, +  crepitus , No joint swelling   Assessment/Plan: 1. Functional deficits which require 3+ hours per day of interdisciplinary therapy in a comprehensive inpatient rehab setting. Physiatrist is providing close team supervision and 24 hour management of active medical problems listed below. Physiatrist and rehab team continue to assess barriers to discharge/monitor patient progress toward functional and medical goals  Care Tool:  Bathing    Body parts bathed by patient: Left arm, Right arm, Chest, Abdomen, Right upper leg, Left upper leg, Front perineal area   Body parts bathed by helper: Right arm, Left arm, Front perineal area, Buttocks, Left lower leg, Right lower leg     Bathing assist Assist Level: Total Assistance - Patient < 25%     Upper Body Dressing/Undressing Upper body dressing   What is the patient wearing?: Pull over shirt    Upper body assist Assist Level: Maximal Assistance - Patient 25 - 49%    Lower Body Dressing/Undressing Lower body dressing  What is the patient wearing?: Pants     Lower body assist Assist for lower body dressing: Total Assistance - Patient < 25%     Toileting Toileting Toileting Activity did not occur Landscape architect and hygiene only): N/A (no void or bm)  Toileting assist Assist for toileting: 2 Helpers     Transfers Chair/bed transfer  Transfers assist     Chair/bed transfer assist level: Maximal Assistance - Patient 25 - 49%     Locomotion Ambulation   Ambulation assist      Assist level: 2 helpers Assistive device: Other (comment) (Hemi-rail) Max distance: 10'   Walk 10 feet activity   Assist     Assist level: 2 helpers Assistive device: Other (comment) (R hemi-rail)   Walk 50 feet activity   Assist  Walk 50 feet with 2 turns activity did not occur: Safety/medical concerns (Patient unable to ambulate >10' at this time secondary to increased fatigue with poor endurance/activity tolerance)         Walk 150 feet activity   Assist Walk 150 feet activity did not occur: Safety/medical concerns         Walk 10 feet on uneven surface  activity   Assist Walk 10 feet on uneven surfaces activity did not occur: Safety/medical concerns         Wheelchair     Assist Is the patient using a wheelchair?: Yes Type of Wheelchair: Manual    Wheelchair assist level: Contact Guard/Touching assist Max wheelchair distance: 150'    Wheelchair 50 feet with 2 turns activity    Assist        Assist Level: Contact Guard/Touching assist   Wheelchair 150 feet activity     Assist      Assist Level: Contact Guard/Touching assist   Blood pressure (!) 147/84, pulse 72, temperature 98 F (36.7 C), resp. rate 17, height 5' 11.5" (1.816 m), weight 93.8 kg, SpO2 93 %.  Medical Problem List and Plan: 1. Functional deficits secondary to right large MCA infarction due to right M2 occlusion status post IR/revascularization as well as history of CVA 2015 with left-sided residual weakness status post loop recorder             -patient may  shower             -ELOS/Goals: 3/28, PT min A, OT min to mod A, SLP supervision- team conf in am            -Continue CIR therapies including PT, OT, and SLP               -Continue work on visual scanning as he appears to have a visual field cut on the left 2.  Antithrombotics: -DVT/anticoagulation:  Pharmaceutical: Eliquis             -antiplatelet therapy: N/A 3. Pain Management: Tylenol as needed  RIght shoulder pain suspect rotator cuff strain vs aggravation of OA, Xray showing evidence of chronic RTC and Glenohumeral OA, discussed pros and cons of injection , pt poor surgical candidate for repair 4. Mood/Behavior/Sleep: Provide emotional  support             -antipsychotic agents: N/A  - sleep chart 5. Neuropsych/cognition: This patient is capable of making decisions on his own behalf. 6. Skin/Wound Care: Routine skin checks 7. Fluids/Electrolytes/Nutrition: Routine in and outs with follow-up chemistries 8.  Dysphagia.  Diet advanced to Dysphagia 3 thin liquids.    9.  Hypertension.  Norvasc 10  mg daily, Toprol-XL 25 mg daily.  Monitor with increased mobility.  Overall well-controlled.  Goal BP less than 180/105, long-term goal normotensive Vitals:   05/29/22 1936 05/30/22 0428  BP: (!) 128/91 (!) 147/84  Pulse: (!) 104 72  Resp: 17 17  Temp: 98.2 F (36.8 C) 98 F (36.7 C)  SpO2: 92% 93%  Controlled 3/12  10.  Atrial fibrillation.  Cardiac rate controlled.  Continue Toprol-xl 25 mg daily On Eliquis  11.  Hyperlipidemia.  Lipitor 20 mg daily 12.  Parotid mass, follow-up outpatient 13.  CKD 3a.    creat improved vs prior       Latest Ref Rng & Units 05/30/2022    6:06 AM 05/19/2022    8:48 AM 05/18/2022    4:54 AM  BMP  Glucose 70 - 99 mg/dL 93  182  127   BUN 8 - 23 mg/dL '22  23  18   '$ Creatinine 0.61 - 1.24 mg/dL 1.26  1.14  0.98   Sodium 135 - 145 mmol/L 139  134  137   Potassium 3.5 - 5.1 mmol/L 3.8  3.7  3.8   Chloride 98 - 111 mmol/L 104  99  104   CO2 22 - 32 mmol/L '25  25  22   '$ Calcium 8.9 - 10.3 mg/dL 8.9  9.2  9.1    14.  Insomnia- Mirtazepine and seroquel - daughter concerned about meds causing MS changes, suspect it is mainly CVA - pt feels like he slept better is more alert today , check sleep graph to document labs look normal     15.  Brief episode of nystagmus - suspect peripheral cause will cont to monitor , has remote R cerebellar infarct on MRI but MRA not showing any sig VB disease  16.  Cognitive issues after Right MCA infarct , UA  and labs neg today, sleep has been disruped, staff has noted mild agitation/ severe anxiety in the evening prior to starting seroquel  LOS: 12 days A FACE TO  [place] E Clarrisa Kaylor 05/30/2022, 8:14 AM

## 2022-05-30 NOTE — NC FL2 (Deleted)
[place] LEVEL OF CARE FORM     IDENTIFICATION  Patient Name: Edward Maynard Birthdate: [birth year] Sex: male Admission Date (Current Location): 05/18/2022  [facility] and [place] Number:  Herbalist and Address:  [facility], [place] [address], [place], [place] [address]      Provider Number:    Attending Physician Name and Address:  Charlett Blake, MD  Relative Name and Phone Number:  Isaias Cowman    Current Level of Care: Hospital Recommended Level of Care: [place] Prior Approval Number:    Date Approved/Denied:   PASRR Number: [id number]  Discharge Plan: SNF    Current Diagnoses: Patient Active Problem List   Diagnosis Date Noted   CKD (chronic kidney disease) 05/18/2022   Insomnia 05/18/2022   Right middle cerebral artery stroke ([place]) 05/18/2022   Essential hypertension 05/16/2022   Obesity 05/16/2022   Advanced age 89/27/2024   Dysphagia 05/16/2022   Stroke (cerebrum) ([place]) 05/10/2022   Middle cerebral artery embolism, 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   Chronic a-fib ([place]) 05/10/2022   Paroxysmal atrial fibrillation ([place]) 01/18/2015   Chronic anticoagulation 01/18/2015   PVC's (premature ventricular contractions) 12/14/2014   NSVT (nonsustained ventricular tachycardia) ([place]) 12/14/2014   Cerebral infarction due to embolism of cerebral artery ([place]) 07/10/2014   Hyperlipidemia 12/12/2013   Personal history of colonic polyp - adenoma  10/23/2013    Orientation RESPIRATION BLADDER Height & Weight     Self, Place, Situation  Normal Incontinent Weight: 206 lb 12.7 oz (93.8 kg) Height:  5' 11.5" (181.6 cm)  BEHAVIORAL SYMPTOMS/MOOD NEUROLOGICAL BOWEL NUTRITION STATUS      Incontinent Diet (d3, thin liq)  AMBULATORY STATUS COMMUNICATION OF NEEDS Skin   Limited Assist Verbally Other (Comment) (Bruises to left arm/hip with abrasion to left knee.(Foam in place))                       Personal Care  Assistance Level of Assistance  Bathing, Feeding, Dressing Bathing Assistance: Limited assistance Feeding assistance: Limited assistance Dressing Assistance: Limited assistance     Functional Limitations Info  Sight Sight Info: Impaired        SPECIAL CARE FACTORS FREQUENCY  PT (By licensed PT), OT (By licensed OT), Speech therapy     PT Frequency: 5x/week OT Frequency: 5x/week     Speech Therapy Frequency: 5x/week      Contractures Contractures Info: Not present    Additional Factors Info  Code Status Code Status Info: FULL             Current Medications (05/30/2022):  This is the current hospital active medication list Current Facility-Administered Medications  Medication Dose Route Frequency Provider Last Rate Last Admin   acetaminophen (TYLENOL) tablet 650 mg  650 mg Oral Q4H PRN Cathlyn Parsons, PA-C   650 mg at 05/29/22 2035   Or   acetaminophen (TYLENOL) 160 MG/5ML solution 650 mg  650 mg Per Tube Q4H PRN Angiulli, Lavon Paganini, PA-C       Or   acetaminophen (TYLENOL) suppository 650 mg  650 mg Rectal Q4H PRN Cathlyn Parsons, PA-C   650 mg at 05/28/22 2030   amLODipine (NORVASC) tablet 10 mg  10 mg Oral Daily Cathlyn Parsons, PA-C   10 mg at 05/30/22 0850   apixaban (ELIQUIS) tablet 5 mg  5 mg Oral BID Cathlyn Parsons, PA-C   5 mg at 05/30/22 0851   atorvastatin (LIPITOR)  tablet 20 mg  20 mg Oral Daily Cathlyn Parsons, PA-C   20 mg at 05/30/22 0850   bisacodyl (DULCOLAX) suppository 10 mg  10 mg Rectal Daily PRN Cathlyn Parsons, PA-C   10 mg at 05/25/22 2059   camphor-menthol (SARNA) lotion   Topical PRN Cathlyn Parsons, PA-C   Given at 05/30/22 0302   docusate sodium (COLACE) capsule 100 mg  100 mg Oral Daily Cathlyn Parsons, PA-C   100 mg at 05/30/22 0851   feeding supplement (ENSURE ENLIVE / ENSURE PLUS) liquid 237 mL  237 mL Oral TID BM AngiulliLavon Paganini, PA-C   237 mL at 05/30/22 1338   hydrOXYzine (ATARAX) tablet 10 mg  10 mg Oral Q6H  PRN Charlett Blake, MD   10 mg at 05/28/22 1950   metoprolol succinate (TOPROL-XL) 24 hr tablet 25 mg  25 mg Oral Daily Cathlyn Parsons, PA-C   25 mg at 05/30/22 0850   mirtazapine (REMERON SOL-TAB) disintegrating tablet 15 mg  15 mg Oral QHS Charlett Blake, MD   15 mg at 05/29/22 2122   QUEtiapine (SEROQUEL) tablet 12.5 mg  12.5 mg Oral QPM Meredith Staggers, MD   12.5 mg at 05/29/22 2035   senna-docusate (Senokot-S) tablet 1 tablet  1 tablet Oral QHS PRN Cathlyn Parsons, PA-C   1 tablet at 05/25/22 2058   traMADol (ULTRAM) tablet 25 mg  25 mg Oral Q6H PRN [address], [place], [place]   25 mg at 05/29/22 2035     Discharge Medications: Please see discharge summary for a list of discharge medications.  Relevant Imaging Results:  Relevant Lab Results:   Additional Information [id number]  Dyanne Iha

## 2022-05-30 NOTE — Progress Notes (Addendum)
Patient ID: Edward Maynard, male   DOB: Dec 01, 1933, 89 y.o.   MRN: [id number]  SW made attempt to contact patient daughter, [name] to provide conference updates from last week. No answer, left derailed voicemail, SW will continue to follow up.    3/12 2:11 PM: Sw made attempt to reach patient spouse, left detailed VM with contact information.

## 2022-05-31 MED ORDER — BUPIVACAINE HCL 0.25 % IJ SOLN
5.0000 mL | Freq: Once | INTRAMUSCULAR | Status: DC
  Filled 2022-05-31: qty 5

## 2022-05-31 MED ORDER — TRIAMCINOLONE ACETONIDE 40 MG/ML IJ SUSP
40.0000 mg | Freq: Once | INTRAMUSCULAR | Status: AC
  Administered 2022-05-31: 40 mg via INTRA_ARTICULAR
  Filled 2022-05-31: qty 1

## 2022-05-31 MED ORDER — BUPIVACAINE HCL (PF) 0.25 % IJ SOLN
10.0000 mL | Freq: Once | INTRAMUSCULAR | Status: AC
  Administered 2022-05-31: 10 mL
  Filled 2022-05-31: qty 10

## 2022-05-31 NOTE — Progress Notes (Signed)
Patient ID: Edward Maynard, male   DOB: 1934/02/02, 89 y.o.   MRN: [id number]  Team Conference Report to Patient/Family  Team Conference discussion was reviewed with the patient and caregiver, including goals, any changes in plan of care and target discharge date.  Patient and caregiver express understanding and are in agreement.  The patient has a target discharge date of 06/15/22 (SNF pending).  Sw spoke with pt's daughter, Edward Maynard and provided conference updates. Sw discussed SNF and HC with daughter. Daughter making decision between SNF and [facility] due to pt's spouse at home with some cognitive impairments. Daughter debating placing both patient and spouse. Sw discussed ST rehab with daughter and the potential benefits of additional therapies. Daughter will review SNF placement list and make determination of SNF or HC. Daughter is agreeable to SW reaching out to insurance to request authorization. Daughter and Sil still plan to attend family education on Saturday. No additional questions or concerns.    Dyanne Iha 05/31/2022, 2:26 PM

## 2022-05-31 NOTE — Progress Notes (Signed)
Occupational Therapy Session Note  Patient Details  Name: Edward Maynard MRN: [id number] Date of Birth: Mar 06, 1934  Today's Date: 05/31/2022 OT Individual Time: [id number] OT Individual Time Calculation (min): 58 min  OT Individual Time: 1435-1537 OT Individual Time Calculation (min): 62 min    Short Term Goals: Week 1:  OT Short Term Goal 1 (Week 1): Pt will maintain dyanmic sitting balance with CGA during BADLs/functional activities OT Short Term Goal 1 - Progress (Week 1): Met OT Short Term Goal 2 (Week 1): Pt will located 5/5 items on L side of sink/tray/table with min cueing OT Short Term Goal 2 - Progress (Week 1): Progressing toward goal OT Short Term Goal 3 (Week 1): Pt will complete UB dressing min A OT Short Term Goal 3 - Progress (Week 1): Progressing toward goal OT Short Term Goal 4 (Week 1): Pt will self-feed with min A OT Short Term Goal 4 - Progress (Week 1): Progressing toward goal Week 2:  OT Short Term Goal 1 (Week 2): Pt will complete UB bathing min A OT Short Term Goal 2 (Week 2): Pt will attend to L visual field/environment/body during BADLs/functional tasks with min cueing  Skilled Therapeutic Interventions/Progress Updates:     AM Session: Pt received supine in bed presenting to be in good spirits and receptive to skilled OT session. Pt reporting decreased pain in LUE now 6/10 with pain medications and ice pack helping- OT offering repositioning, AAROM, and gentle stretching to decrease pain.   Pt requesting water at beginning of session. Pt transitioned from supine to EOB mod A using bed features with HOB elevated with cueing required for technique. Provided Pt water from cup with straw with Pt able to drink and tolerate without coughing noted with dentures in place.   Focused beginning of session on providing gentle AAROM of Pt RUE to decrease pain and maintain current ROM to support maximal participation. Pt able to complete low level table glides with active  assist to facilitate gentle stretch and shoulder protraction/retraction. Guided pt through gentle neck stretches with tactile/VB cueing and demonstration provided with decreased pain/stiffness reported following.   MD entering room during session to provide R shoulder injection for pain management. Facilitated Pt in doffing shirt using hemi dressing technique reaching over his back with LUE with assistance required to maintain grasp on shirt. Pt able to maintain static sitting balance with close supervision to CGA for task. Following injection, donned shirt seated EOB with max A.   Pt completed squat pivot transfer to R side with heavy mod A with pt holding onto bottom of therapist shirt to prevent further injury. Pt demonstrating increased LLE activation and awareness during transfer with OT facilitating weightbearing.   Pt participated in range of NMR functional grasp and release and targeted reaching activities to facilitate increased LUE use.  -Grasp and release of sponge with targeted reach handing sponge to therapist with increased control and muscle activation of tricep and biceps noted.  -Crumpling of news paper to facilitate finger extension and grasp with noted improvement of initiation of finger extension following. Pt noted to be able to initially open and close hand, however requiring assistance to reopen fingers. Pt then instructed to throw news paper "like a baseball" over table to facilitate wrist extension and release.   Pt left resting in wc at end of session with LUE propped on L-sided tray, seat belt alarm on, call bell in reach and all needs met.   PM Session:  Pt received  semi reclined in bed presenting fatigued, however in good spirits and receptive to skilled OT session. Pt reporting unrated "less pain" in R shoulder. Pt tearful at beginning of session reporting "I am just not doing everything I want to do". OT provided therapeutic support, encouragement, and listening with noted  improvement in moral noted following. Engaged Pt in lighthearted conversation and informed him of his progress with Pt demonstrating improved self-efficacy following.  Pt transitioned to EOB with mod A +time using bed features with mod cueing required for technique. Sitting EOB Pt able to maintain sitting balance with close supervision while OT donned compression socks and L foot brace and shoes total A for time management. Pt noted to lean to L and posteriorly with single LE support, however pt able to return trunk to midline with mod VB cueing.   Pt completed squat pivot transfer to R side with mod A and mod verbal cueing for technique. Pt transported total A to therapy gym in wc for time management and energy conservation.   1:1 NMES applied to wrist extensors to increase muscle activation and return for functional activities. Pt with positive response to NMES with increased wrist extension with OT guiding Pt through completing with on/off cycles. Pt encouraged to seeing increased movement and awareness of wrist extension muscles.   Ratio 1:3 Rate 35 pps Waveform- Asymmetric Ramp 1.0 Pulse 300 Intensity- 20 Duration -  10 minutes  Report of pain at the beginning of session 0 Report of pain at the end of session 0  No adverse reactions after treatment and is skin intact.    Following NMES, Pt completed grasp/release and functional reaching activities with noted improvement in wrist extension, with Pt able to initiate diget extension requiring assistance to move through full ROM in preparation for grasp. Pt continuing to present with challenges grading force for grasp and decreased proprioception during reach with under/overshooting. Pt transported back to room total A in wc.   Pt completed squat pivot wc>EOB with mod A. Sit>supine mod A to lift legs with mod cueing provided for technique. Pt left resting in bed with call bell in reach, bed alarm on, and all needs met.    Therapy  Documentation Precautions:  Precautions Precautions: Fall Precaution Comments: Left hemi; Left homonymous hemianopsia Restrictions Weight Bearing Restrictions: No General:   Vital Signs:   Pain: Pain Assessment Pain Scale: 0-10 Pain Score: 3  Faces Pain Scale: No hurt Pain Type: Acute pain Pain Location: Arm Pain Orientation: Right Pain Descriptors / Indicators: Discomfort Pain Frequency: Intermittent Pain Onset: Gradual Patients Stated Pain Goal: 0 Pain Intervention(s): Medication (See eMAR) Multiple Pain Sites: No PAINAD (Pain Assessment in Advanced Dementia) Breathing: normal Negative Vocalization: none Facial Expression: smiling or inexpressive Body Language: relaxed Consolability: no need to console PAINAD Score: 0 ADL: ADL Eating: Moderate assistance Where Assessed-Eating: Bed level Grooming: Moderate assistance Where Assessed-Grooming: Edge of bed Upper Body Bathing: Moderate assistance, Maximal assistance Where Assessed-Upper Body Bathing: Edge of bed Lower Body Bathing: Maximal assistance Where Assessed-Lower Body Bathing: Edge of bed Upper Body Dressing: Moderate assistance, Maximal assistance Where Assessed-Upper Body Dressing: Edge of bed Lower Body Dressing: Maximal assistance Where Assessed-Lower Body Dressing: Edge of bed Toileting: Dependent, Maximal assistance Where Assessed-Toileting: Bed level Toilet Transfer: Dependent, Maximal assistance (stedy, able to pull self to standing; not safety to atempt squat pivot or ambulate to bathroom) Toilet Transfer Equipment: Bedside commode Tub/Shower Transfer: Not assessed Social research officer, government: Dependent, Maximal assistance (stedy) Social research officer, government Method:  Other (comment) (stedy) Walk-In Shower Equipment: Radio broadcast assistant   Therapy/Group: Individual Therapy  Janey Genta 05/31/2022, 7:56 AM

## 2022-05-31 NOTE — Progress Notes (Signed)
Physical Therapy Session Note  Patient Details  Name: Edward Maynard MRN: [id number] Date of Birth: Mar 22, 1933  Today's Date: 05/31/2022 PT Individual Time: 0815-0900 PT Individual Time Calculation (min): 45 min   Short Term Goals: Week 2:  PT Short Term Goal 1 (Week 2): Pt will perform bed mobility with min assist PT Short Term Goal 2 (Week 2): Pt will perform bed<>chair transfers using LRAD with min assist PT Short Term Goal 3 (Week 2): Pt will perform sit<>stand transfers min assist using LRAD PT Short Term Goal 4 (Week 2): Pt will ambulate at least 79f using LRAD with mod assist of 1 and +2 for safety  Skilled Therapeutic Interventions/Progress Updates:      Pt supine in bed to start - awake and in agreement to therapy session.   Pt reporting 8/10 R shoulder pain which started a few days ago when he was assisted in transferring. He believes he tore his rotator cuff - an XR was done showing no changes but patient with painful and limited shoulder external rotation and elevation, highly guarded. RN notified of patient's request for pain Rx.  Supine<>sitting EOB with mod/maxA with hospital bed features, limited ability to assist with trunk due to painful R shlder and L hemiplegia. Able to sit EOB unsupported with fair sitting balance, posteriorly tilted at his pelvis. Stand<>pivot transfer with modA from raised EOB to w/c and patient requesting urinal due to urgency to void - assist with urinal placement and pt continent of urine while sitting.   Donned L SMO and tennis shoes with totalA. Required totalA for donning pants as well due to R shoulder pain. Patient requiring maxA for sit<>Stand for balance and powering to rise - strong posterior bias with some failed attempts. Does well responding to external cues and cues for engaging glut's and L quad to improve stance control on L. totalA for bring pants over hips in standing.   Transported patient in w/c to main rehab gym. In gym  environment, worked on visual scanning and head turns to his L - practiced naming objects on his L side - eyes track past midline with mild difficulty sustaining. RN arriving for Rx. Practiced a few sit<>Stands from w/c with modA overall - worked on [place] standing balance, glut activation, midline, and weight shift R.   Direct handoff of care to PT at end of session with patient sitting in w/c in main rehab gym. All needs met.     Therapy Documentation Precautions:  Precautions Precautions: Fall Precaution Comments: Left hemi; Left homonymous hemianopsia Restrictions Weight Bearing Restrictions: No General:     Therapy/Group: Individual Therapy  Tarshia Kot P Sephiroth Mcluckie PT 05/31/2022, 8:19 AM

## 2022-05-31 NOTE — Progress Notes (Signed)
Patient ID: Edward Maynard, male   DOB: 25-Jul-1933, 89 y.o.   MRN: [id number]  SW made attempt to call patients's daughter, Edward Maynard to provide conference updates. Left detailed VM.

## 2022-05-31 NOTE — Progress Notes (Signed)
Physical Therapy Session Note  Patient Details  Name: Edward Maynard MRN: [id number] Date of Birth: [birth year]  Today's Date: 05/31/2022 PT Individual Time: [id number] and 1310-1350 PT Individual Time Calculation (min): 40 min and 40 min  Short Term Goals: Week 2:  PT Short Term Goal 1 (Week 2): Pt will perform bed mobility with min assist PT Short Term Goal 2 (Week 2): Pt will perform bed<>chair transfers using LRAD with min assist PT Short Term Goal 3 (Week 2): Pt will perform sit<>stand transfers min assist using LRAD PT Short Term Goal 4 (Week 2): Pt will ambulate at least 34f using LRAD with mod assist of 1 and +2 for safety  Skilled Therapeutic Interventions/Progress Updates:    Session 1: Pt received sitting in w/c in main gym as hand-off from previous PT and pt agreeable to continue with this session. Pt reporting 10/10 pain in R shoulder - per nurse premedicated. MD in/out for morning assessment - discussed R shoulder injury and pain - recommending ice and possible use of TENs for pain management. R squat pivot transfer w/c>EOM with heavy mod assist for pivoting hips and balance - continues to require max step-by-step cuing for sequencing, increasing anterior trunk lean and lifting hips using B LEs - continuing to have pt hold therapist's shirt on R side to avoid increased pain to R shoulder. Sit<>stands from EOM with L UE support around thearpist and +2 assist in front of pt as visual feedback for upright/midline orientation - requires heavy min assist to lift into standing then mod assist for balance once in standing due to strong L lean with anterior bias primarily - continues to lack sustained L knee extension during stance due to impaired sensation/proprioception - impaired motor planning to extend L knee when verbally cued to perform quad activation, does better with visual feedback for midline and then he more naturally extends L knee to move his body. L squat pivot EOM>w/c with heavy  mod assist again for pivoting hips primarily - continued max cuing for R anterior trunk lean and lifting hips with B LEs - holding therapist with R hand to avoid further R shoulder pain. Transported back to room. R squat pivot back to bed as described above. Sit>supine via reverse logroll technique with max cuing for sequencing and mod assist for B LE management into bed. Ice applied to R shoulder for pain management. Pt left supine in bed with needs in reach and bed alarm on.   Session 2: Pt received sitting in w/c and agreeable to therapy session. Pt reports min R shoulder pain relief from injection. Transported to/from gym in w/c for time management and energy conservation.  Sit>stand w/c>+2 assist R HHA with min assist for rising to stand and mod assist for balance once in standing due to L anterior trunk lean/LOB.  Pt already wearing L ankle ASO and donned leg strap for gait training.  Gait training [id number] +2 R HHA and L UE support over therapist's shoulders with +2 mod assist for balance - +3rd person assist w/c follow to allow increased gait distance. Pt demonstrating the following gait deviations with therapist providing the described cuing and facilitation for improvement:  - requires max/total assist to advance/place L LE during swing phase using leg strap due to lack of sensation/proprioception - pt has active muscle movement to lift leg and bring it forward but it is uncoordinated in a flexor synergy pattern with excessive hip external rotation with adduction placing ankle at  risk for inversion rolling or he kicks it too far forward outside his BOS - this continues to improve - continues to require max L knee block during stance phase ~85-90% of the time due to lack of sensation telling him the need to extend it, when not blocking pt does not truly buckle, but the knee flexion causes worse L lean - continues to have heavy L lateral lean with fatigue or when his motor planning  breaks down  Transported back to room.   Block practice squat pivot transfers.  R squat pivot w/c>EOB with mod assist for pivoting hips and balance due to L lean - pt transitions to doing more of a partial stand pivot transfer trying to take steps with RLE requiring therapist to block L knee for safety. Educated pt on plan to perform true squat pivot transfers rather than attempting stand pivot. L squat pivot EOB>w/c with mod assist for pivoting hips and pt staying in a true squat for the transfer. R squat pivot back to EOB with pt demoing much better squat position during the transfer - continues to require mod assist.  Doffed shoes and L LE ASO.   Sit>supine via reverse logroll technique with mod assist for B LE management onto bed. Therapist provided ice for R shoulder pain. Pt left supine in bed with needs in reach, bed alarm on, and L UE supported on pillow.   Therapy Documentation Precautions:  Precautions Precautions: Fall Precaution Comments: Left hemi; Left homonymous hemianopsia Restrictions Weight Bearing Restrictions: No   Pain: Session 1: R shoulder pain - see above.  Session 2: R shoulder pain - pt reports min relief from injection - modified interventions, provided distractions and emotional support for pain management - premedicated.    Therapy/Group: Individual Therapy  Tawana Scale , PT, DPT, NCS, CSRS 05/31/2022, 7:44 AM

## 2022-05-31 NOTE — Progress Notes (Addendum)
PROGRESS NOTE   Subjective/Complaints:  RIght shoulder pain increased limited ROM, discussed Xray findings with PT and pt . Spoke with daughter on 3/12 regarding worsening fxn due to Right shoulder pain  ROS- neg CP, SOB, N/V/D Objective:   DG Shoulder Right  Result Date: 05/29/2022 CLINICAL DATA:  [id number] Acute pain of right shoulder [id number] EXAM: RIGHT SHOULDER - 2+ VIEW COMPARISON:  None Available. FINDINGS: There is no evidence of acute fracture. Alignment is normal. There is moderate glenohumeral and AC joint osteoarthritis. High-riding humeral head with diminished acromial humeral interval. IMPRESSION: No evidence of acute fracture or dislocation. Moderate glenohumeral and AC joint osteoarthritis. High-riding humeral head suggesting chronic distal rotator cuff disease. Electronically Signed   By: Maurine Simmering M.D.   On: 05/29/2022 16:22   Recent Labs    05/30/22 0606  WBC 7.5  HGB 15.2  HCT 43.0  PLT 149*    Recent Labs    05/30/22 0606  NA 139  K 3.8  CL 104  CO2 25  GLUCOSE 93  BUN 22  CREATININE 1.26*  CALCIUM 8.9     Intake/Output Summary (Last 24 hours) at 05/31/2022 0835 Last data filed at 05/30/2022 1856 Gross per 24 hour  Intake 238 ml  Output 400 ml  Net -162 ml         Physical Exam: Vital Signs Blood pressure 111/72, pulse 84, temperature 98 F (36.7 C), resp. rate 18, height 5' 11.5" (1.816 m), weight 93.8 kg, SpO2 93 %.   General: No acute distress Mood and affect are appropriate Heart: Regular rate and rhythm no rubs murmurs or extra sounds Lungs: Clear to auscultation, breathing unlabored, no rales or wheezes Abdomen: Positive bowel sounds, soft nontender to palpation, nondistended Extremities: No clubbing, cyanosis, or edema Skin: No evidence of breakdown, no evidence of rash   Neuro: Fairly alert/attentive  Eyes without evidence of nystagmus  Tone is normal without evidence of  spasticity Cerebellar exam not tested due to weakness on L   3- to 3+/5 Left delt , bi , tri, 2-/5 finger flexors and extensors  3- to 3/5 Left HF, KE, ADF --stable in appearance Sensory exam absent LT in LUE and LLE, senses pinch LUE   Musculoskeletal: pain with Right shoulder ROM, +  crepitus , No joint swelling   Assessment/Plan: 1. Functional deficits which require 3+ hours per day of interdisciplinary therapy in a comprehensive inpatient rehab setting. Physiatrist is providing close team supervision and 24 hour management of active medical problems listed below. Physiatrist and rehab team continue to assess barriers to discharge/monitor patient progress toward functional and medical goals  Care Tool:  Bathing    Body parts bathed by patient: Left arm, Right arm, Chest, Abdomen, Right upper leg, Left upper leg, Front perineal area   Body parts bathed by helper: Right arm, Left arm, Front perineal area, Buttocks, Left lower leg, Right lower leg     Bathing assist Assist Level: Total Assistance - Patient < 25%     Upper Body Dressing/Undressing Upper body dressing   What is the patient wearing?: Pull over shirt    Upper body assist Assist Level: Maximal Assistance - Patient  25 - 49%    Lower Body Dressing/Undressing Lower body dressing      What is the patient wearing?: Pants     Lower body assist Assist for lower body dressing: Total Assistance - Patient < 25%     Toileting Toileting Toileting Activity did not occur Landscape architect and hygiene only): N/A (no void or bm)  Toileting assist Assist for toileting: 2 Helpers     Transfers Chair/bed transfer  Transfers assist     Chair/bed transfer assist level: Moderate Assistance - Patient 50 - 74%     Locomotion Ambulation   Ambulation assist      Assist level: 2 helpers Assistive device: Other (comment) (Hemi-rail) Max distance: 10'   Walk 10 feet activity   Assist     Assist level: 2  helpers Assistive device: Other (comment) (R hemi-rail)   Walk 50 feet activity   Assist Walk 50 feet with 2 turns activity did not occur: Safety/medical concerns (Patient unable to ambulate >10' at this time secondary to increased fatigue with poor endurance/activity tolerance)         Walk 150 feet activity   Assist Walk 150 feet activity did not occur: Safety/medical concerns         Walk 10 feet on uneven surface  activity   Assist Walk 10 feet on uneven surfaces activity did not occur: Safety/medical concerns         Wheelchair     Assist Is the patient using a wheelchair?: Yes Type of Wheelchair: Manual    Wheelchair assist level: Contact Guard/Touching assist Max wheelchair distance: 150'    Wheelchair 50 feet with 2 turns activity    Assist        Assist Level: Contact Guard/Touching assist   Wheelchair 150 feet activity     Assist      Assist Level: Contact Guard/Touching assist   Blood pressure 111/72, pulse 84, temperature 98 F (36.7 C), resp. rate 18, height 5' 11.5" (1.816 m), weight 93.8 kg, SpO2 93 %.  Medical Problem List and Plan: 1. Functional deficits secondary to right large MCA infarction due to right M2 occlusion status post IR/revascularization as well as history of CVA 2015 with left-sided residual weakness status post loop recorder             -patient may  shower             -ELOS/Goals: 3/28, PT min A, OT min to mod A, SLP supervision- team conf in am            -Continue CIR therapies including PT, OT, and SLP               -Continue work on visual scanning as he appears to have a visual field cut on the left 2.  Antithrombotics: -DVT/anticoagulation:  Pharmaceutical: Eliquis             -antiplatelet therapy: N/A 3. Pain Management: Tylenol as needed  RIght shoulder pain suspect rotator cuff strain vs aggravation of OA, Xray showing evidence of chronic RTC and Glenohumeral OA, discussed pros and cons of  injection , pt poor surgical candidate for repair- given pain and poor candidate for 0stronger opioids will inject Right glenohumeral joint with Marcaine and kenalog 4. Mood/Behavior/Sleep: Provide emotional support             -antipsychotic agents: N/A  - sleep chart 5. Neuropsych/cognition: This patient is capable of making decisions on his own behalf. 6. Skin/Wound  Care: Routine skin checks 7. Fluids/Electrolytes/Nutrition: Routine in and outs with follow-up chemistries 8.  Dysphagia.  Diet advanced to Dysphagia 3 thin liquids.    9.  Hypertension.  Norvasc 10 mg daily, Toprol-XL 25 mg daily.  Monitor with increased mobility.  Overall well-controlled.  Goal BP less than 180/105, long-term goal normotensive Vitals:   05/30/22 1953 05/31/22 0349  BP: 126/67 111/72  Pulse: 84 84  Resp: 16 18  Temp: 97.9 F (36.6 C) 98 F (36.7 C)  SpO2: 92% 93%  Controlled 3/13  10.  Atrial fibrillation.  Cardiac rate controlled.  Continue Toprol-xl 25 mg daily On Eliquis  11.  Hyperlipidemia.  Lipitor 20 mg daily 12.  Parotid mass, follow-up outpatient 13.  CKD 3a.    creat improved vs prior       Latest Ref Rng & Units 05/30/2022    6:06 AM 05/19/2022    8:48 AM 05/18/2022    4:54 AM  BMP  Glucose 70 - 99 mg/dL 93  182  127   BUN 8 - 23 mg/dL '22  23  18   '$ Creatinine 0.61 - 1.24 mg/dL 1.26  1.14  0.98   Sodium 135 - 145 mmol/L 139  134  137   Potassium 3.5 - 5.1 mmol/L 3.8  3.7  3.8   Chloride 98 - 111 mmol/L 104  99  104   CO2 22 - 32 mmol/L '25  25  22   '$ Calcium 8.9 - 10.3 mg/dL 8.9  9.2  9.1    14.  Insomnia- Mirtazepine and seroquel - daughter concerned about meds causing MS changes, suspect it is mainly CVA - pt feels like he slept better is more alert today , check sleep graph to document labs look normal     15.  Brief episode of nystagmus - suspect peripheral cause will cont to monitor , has remote R cerebellar infarct on MRI but MRA not showing any sig VB disease  16.  Cognitive  issues after Right MCA infarct , UA  and labs neg today, sleep has been disrupted, staff has noted mild agitation/ severe anxiety in the evening prior to starting seroquel    Shoulder injection RIght glenohumeral  Indication:Right Shoulder pain not relieved by medication management and other conservative care.  Informed consent was obtained after describing risks and benefits of the procedure with the patient, this includes bleeding, bruising, infection and medication side effects. The patient wishes to proceed and has given written consent. Patient was placed in a seated position. TheRight shoulder was marked and prepped with betadine in the subacromial area. A 25-gauge 1-1/2 inch needle was inserted into the subacromial area. After negative draw back for blood, a solution containing 1 mL of 40 mg per ML Kenoalog and 4 mL of .25% bupivacaine was injected. A band aid was applied. The patient tolerated the procedure well. Post procedure instructions were given.   Kenalog [id number] exp Dec 2024 Bupivacaine [id number] exp 09/27 LOS: 13 days A FACE TO [place] Edward Maynard 05/31/2022, 8:35 AM

## 2022-05-31 NOTE — Patient Care Conference (Cosign Needed Addendum)
Inpatient RehabilitationTeam Conference and Plan of Care Update Date: 05/31/2022   Time: 10:40 AM    Patient Name: Edward Maynard      Medical Record Number: [id number]  Date of Birth: 1934/01/21 Sex: Male         Room/Bed: 4W02C/4W02C-01 Payor Info: Payor: [employer] MEDICARE / Plan: [facility] MEDICARE / Product Type: *No Product type* /    Admit Date/Time:  05/18/2022  3:01 PM  Primary Diagnosis:  Right middle cerebral artery stroke [facility])  Hospital Problems: Principal Problem:   Right middle cerebral artery stroke [facility])    Expected Discharge Date: Expected Discharge Date: 06/15/22 (SNF pending)  Team Members Present: Physician leading conference: Dr. Alysia Penna Social Worker Present: Erlene Quan, BSW Nurse Present: Dorien Chihuahua, RN PT Present: Page Spiro, PT OT Present: Other (comment) Lowella Fairy, OT) SLP Present: Other (comment) PPS Coordinator present : Gunnar Fusi, SLP     Current Status/Progress Goal Weekly Team Focus  Bowel/Bladder   Pt is continent/incontinent of bowel/bladder   Pt will gain continence of bowel/bladder   Will assess qshift and PRN    Swallow/Nutrition/ Hydration   D3/thin liquids, Mod I   mod I  May trial regular textures    ADL's   Max A U/LB dressing, mod A grooming/hygeine, max toileting (+2 for safety when note using stedy d/t dynamic standing balance challenges), mod A UB bathing, max LB bathing, mod squat pivot transfers to [facility] or shower chair (+2 for safety when pt is fatgued and d/t new onset of R shoudler injury); limited by decreased proprioception and motor planning challenges, RUE shoulder injury impacting independence in utilizing hemi-strategies   supervision to min A   L NMR, L visual scanning, family education, d/c planning, activity tolerance training, fucntional mobility, BADL retraining, L-sided awareness, motor planing, midline orientation, dynamic sitting balance, functional tansfers    Mobility   mod  assist bed mobility using bed features, mod assist squat pivot transfers, min/mod assist sit<>stands but mod assist static standing balance and +2 mod assist dynamic standing balance, +2 mod assist gait up to 184f with total assist to position/place L LE after swing phase due to impaired proprioception - pt with R shoulder injury on Sunday 05/28/22 resulting in decreased ability to utilize that UE to compensate for L hemiparesis requiring increased assist for mobility   CGA transfers, min assist gait, supervision wheelchair mobility - will need to downgrade due to new R shoulder limitations  pt/family education, bed mobility training, transfer training, gait training, L hemibody NMR, L attention, motor planning, midline orientation    Communication                Safety/Cognition/ Behavioral Observations  Mod to Max A limited by visual deficits, decreased awareness, and fatigue.   sup-to-min A   L visual scanning, problem-solving, emergent awareness    Pain   Pt denies pain   Pt will remain pain free   Will assess qshift and PRN    Skin   Pt's skin is intact   Pt's skin will remain intact  Will assess qshift and PRN      Discharge Planning:  Discharging home with spouse, son and SIL. SIL WFH and assisting 8a-8p. Potential to hire caregivers for nights and weekends, sw waiting to confirm with daughter.   Team Discussion: Patient post right MCA CVA; labs ok, but note confusion, on meds for insomnia and sleeping but reports not rested, sleep chart initiation. Injury to right RTC this  weekend; no fracture, note limited ROM due to pain and humeral elevation with premorbid RTC instability and is not a surgical candidate with limited recovery. MD to try steroid injection to shoulder.   Patient on target to meet rehab goals: Currently needs mod assist for upper body bathing, max assist for dressing and lower body care. Needs mod - max assist for hygiene and max assist for toileting, due to  left hemiparesis and Right RTC injury.  Completes squat pivots with mod assist and needs min - mod assist for sit- stand. Needs mod assist for transfers using "blocked transfer method". Able to ambulate 22' with HHA however limited by decreased proprioception and motor planning deficits.  Maintained on a D 3/thin liquid diet with dentures in. Requires supervision for scanning left and awareness.   *See Care Plan and progress notes for long and short-term goals.   Revisions to Treatment Plan:  Downgraded transfer goal to min assist Regular diet trials TENs unit trial E-Stim for right forearm   Teaching Needs: Safety, medications, dietary modification, ROM limitations, transfers, toileting, etc.   Current Barriers to Discharge: Decreased caregiver support and Home enviroment access/layout  Possible Resolutions to Barriers: SNF recommended Family education If d/c home; will need DME: hospital bed, W/C, DA- BSC, TTB     Medical Summary Current Status: R rotator cuff injury plus OA resulting in shoulder pain and restricted ROM  Barriers to Discharge: Medical stability;Uncontrolled Pain   Possible Resolutions to [employer] Focus: shoulder injection, trial of TENS and Ice   Continued Need for Acute Rehabilitation Level of Care: The patient requires daily medical management by a physician with specialized training in physical medicine and rehabilitation for the following reasons: Direction of a multidisciplinary physical rehabilitation program to maximize functional independence : Yes Medical management of patient stability for increased activity during participation in an intensive rehabilitation regime.: Yes Analysis of laboratory values and/or radiology reports with any subsequent need for medication adjustment and/or medical intervention. : Yes   I attest that I was present, lead the team conference, and concur with the assessment and plan of the team.   Dorien Chihuahua  B 05/31/2022, 2:43 PM

## 2022-06-01 NOTE — Progress Notes (Signed)
Physical Therapy Session Note  Patient Details  Name: Edward Maynard MRN: [id number] Date of Birth: May 30, 1933  Today's Date: 06/01/2022 PT Individual Time: 1400-1425 PT Individual Time Calculation (min): 25 min   Short Term Goals: Week 2:  PT Short Term Goal 1 (Week 2): Pt will perform bed mobility with min assist PT Short Term Goal 2 (Week 2): Pt will perform bed<>chair transfers using LRAD with min assist PT Short Term Goal 3 (Week 2): Pt will perform sit<>stand transfers min assist using LRAD PT Short Term Goal 4 (Week 2): Pt will ambulate at least 18f using LRAD with mod assist of 1 and +2 for safety  Skilled Therapeutic Interventions/Progress Updates:    Pt seated in w/c on arrival and agreeable to therapy. No complaint of pain. Session focused on block practice of squat pivot bed/chair to L into bed and to R into chair. Limited RUE involvement d/t shoulder injury. Consistent cueing for position of LUE, with direct cues required as pt completely unaware of location even when questioned. Instructional cueing for head hips relationship and appropriate set up. Pt performed x 2 with heavy mod A fading to true mod A with improved technique. Min A for sit>supine for LLE, pt would likely be able to perform with hemi technique but LLE became stuck under RLE and required assist. Pt remained in bed with LUE positioned on pillows and was left with all needs in reach and alarm active.   Therapy Documentation Precautions:  Precautions Precautions: Fall Precaution Comments: Left hemi; Left homonymous hemianopsia Restrictions Weight Bearing Restrictions: No General:       Therapy/Group: Individual Therapy  OMickel Fuchs3/14/2024, 2:19 PM

## 2022-06-01 NOTE — Progress Notes (Signed)
Speech Language Pathology Daily Session Note  Patient Details  Name: Edward Maynard MRN: [id number] Date of Birth: Mar 30, 1933  Today's Date: 06/01/2022 SLP Individual Time: 1305-1350 SLP Individual Time Calculation (min): 45 min  Short Term Goals: Week 2: SLP Short Term Goal 1 (Week 2): Patient will consume dysphagia 3 textures and thin liquids with minimal overt s/sx of aspiration and with improved mastication effectiveness, oral clearance with use of lingual sweep with sup verbal cues for implementation of swallowing precautions SLP Short Term Goal 2 (Week 2): Patient will scan left of midline during functional tasks with mod A verbal cues to achieve 50% accuracy SLP Short Term Goal 3 (Week 2): Patient will complete medication management tasks with min-to-mod A verbal cues to achieve 70% accuracy SLP Short Term Goal 4 (Week 2): Patient will complete problem solving tasks with awareness of errors given min-to-mod A verbal cues to ID and repair  Skilled Therapeutic Interventions: Pt seen this date for skilled ST intervention targeting cognitive goals outlined above. Pt received awake/alert and OOB in w/c. Agreeable to intervention in hospital. Labile towards end of session. Continues to present with depressed mood.    SLP facilitated today's session by providing Max A, faded to Mod-Max A verbal and visual cues during therapeutic visual scanning tasks - did best with large print and with providing a reference point for the beginning of the line on the L. Required visual anchor on left and verbal cues for use of lighthouse scanning technique, which pt did not implement consistently nor without verbal and visual cues. Additionally, benefited from [place] to Mod A verbal cues to include repetition and reasoning cues to complete simple mental math/working memory tasks to address functional problem-solving - required Mod A for error awareness and correction.  Pt left in room and OOB in w/c with all safety  measures activated, call bell within reach, and all immediate needs met. Continue per current ST POC.   Pain No pain reported to this therapist; NAD  Therapy/Group: Individual Therapy  Fawn Desrocher A [place] Nordquist 06/01/2022, 2:25 PM

## 2022-06-01 NOTE — Progress Notes (Signed)
Speech Language Pathology Weekly Progress and Session Note  Patient Details  Name: Edward Maynard MRN: [id number] Date of Birth: [birth year]  Beginning of progress report period: May 24, 2022 End of progress report period: June 01, 2022   Short Term Goals: Week 2: SLP Short Term Goal 1 (Week 2): Patient will consume dysphagia 3 textures and thin liquids with minimal overt s/sx of aspiration and with improved mastication effectiveness, oral clearance with use of lingual sweep with sup verbal cues for implementation of swallowing precautions SLP Short Term Goal 1 - Progress (Week 2): Met SLP Short Term Goal 2 (Week 2): Patient will scan left of midline during functional tasks with mod A verbal cues to achieve 50% accuracy SLP Short Term Goal 2 - Progress (Week 2): Met SLP Short Term Goal 3 (Week 2): Patient will complete medication management tasks with min-to-mod A verbal cues to achieve 70% accuracy SLP Short Term Goal 3 - Progress (Week 2): Not met SLP Short Term Goal 4 (Week 2): Patient will complete problem solving tasks with awareness of errors given min-to-mod A verbal cues to ID and repair SLP Short Term Goal 4 - Progress (Week 2): Not met    New Short Term Goals: Week 3: SLP Short Term Goal 1 (Week 3): Pt will trial regular textures with functional oral phase and no overt s/sx concering for airway intrusion with Mod I for implementation of safe swallowing strategies. SLP Short Term Goal 2 (Week 3): Patient will scan left of midline during functional tasks with mod A verbal cues to achieve 70% accuracy SLP Short Term Goal 3 (Week 3): Pt will complete basic, functional problem-solving tasks with 70% accuracy given Min-Mod A verbal and/or visual cues.  Weekly Progress Updates: Pt with subtle progress - met 2 out of 4 short-term goals -goals updated accordingly. Pt and family education ongoing. Currently, pt benefits from Mod-Max A multimodal cues for completion of visual scanning tasks  to L and Min-Mod to Mod A for completion of basic problem-solving tasks that involve working memory. Pt limited by seemingly depressed mood at times. Recommend ongoing ST intervention during this admission ,as well as f/u intervention at next venue of care and 24/7 supervision and assistance at time of d/c.   Intensity: Minumum of 1-2 x/day, 30 to 90 minutes Frequency: 3 to 5 out of 7 days Duration/Length of Stay: 3/28 Treatment/Interventions: Cognitive remediation/compensation;Speech/Language facilitation;Dysphagia/aspiration precaution training;Functional tasks;Patient/family education;Therapeutic Activities   Hendry Speas A Natsuko Kelsay 06/01/2022, 10:23 PM

## 2022-06-01 NOTE — Progress Notes (Signed)
Occupational Therapy Session Note  Patient Details  Name: Edward Maynard MRN: [id number] Date of Birth: 1933/06/19  Today's Date: 06/01/2022 OT Individual Time:  [id number]    OT Individual Time Calculation: 58 min  Short Term Goals: Week 1:  OT Short Term Goal 1 (Week 1): Pt will maintain dyanmic sitting balance with CGA during BADLs/functional activities OT Short Term Goal 1 - Progress (Week 1): Met OT Short Term Goal 2 (Week 1): Pt will located 5/5 items on L side of sink/tray/table with min cueing OT Short Term Goal 2 - Progress (Week 1): Progressing toward goal OT Short Term Goal 3 (Week 1): Pt will complete UB dressing min A OT Short Term Goal 3 - Progress (Week 1): Progressing toward goal OT Short Term Goal 4 (Week 1): Pt will self-feed with min A OT Short Term Goal 4 - Progress (Week 1): Progressing toward goal Week 2:  OT Short Term Goal 1 (Week 2): Pt will complete UB bathing min A OT Short Term Goal 2 (Week 2): Pt will attend to L visual field/environment/body during BADLs/functional tasks with min cueing  Skilled Therapeutic Interventions/Progress Updates:     Pt received with nursing staff returning form using restroom. Pt presenting to be in good spirits, however mildly upset d/t pain from R shoulder injury. Pt reporting "I was doing so well before this happened". Pt provided therapeutic support and listening with noted improvement in moral. Offered Pt shower, however Pt politely declining d/t fatigue.   Pt noted to be wearing same shirt as previous day before with Pt receptive to changing into clean shirt. Pt doffed shirt reaching overhead with LUE with min A to maintain grip on shirt ad control movement d/t ataxia. Pt provided washcloth to complete UB bathing. Facilitated UE use for NMR with OT providing HOH A to facilitate controled movement with adequate pressure. Pt continuing to demonstrate decreased awareness to LUE and proprioception deficits, however increased muscle  activation with mildly improved control. Applied anti itch lotion to Pt LUE and back to decrease itching and support Pt participation in session. Assisted Pt with grooming hair to support improving Pt moral. Pt donned shirt with max A and cueing for technique, however pt participating in gripping shirt and placing arm into sleeves.   Pt transported to therapy gym total A in wc for time management and energy conservation. Pt tearful discussing life memories and unexpected life change of CVA. Pt provided therapeutic support, motivation, and affirmation with noted improvement in moral. Engaged Pt in lighthearted conversation throughout remainder of session and provided Pt with water to support Pt moral and participation in session.   Pt completed dynamic standing balance task at elevated table to increased Pt body awareness, attention to L side, LUE control, LB strength, and standing balance. Pt able to complete sit<>stands with mod A during activity with VB and tactile cueing provided for technique and anterior weight shifting. In standing, Pt able to maintain balance with RUE supported on table min-mod A with OT blocking L knee to prevent buckling and facilitating weight shifting to facilitate pt finding COG. Once in midline, OT facilitated LUE targeted reaching task with weightbearing through LUE incorporated into task. Pt LUE placed on wash cloth with Pt instructed to push washcloth <> target reaching anteriorly outside COG. Pt able to complete 3 trials maintaining standing balance ~1-2 minutes prior to requiring rest break.   Pt fatigued at end of session. Pt transported back to room total A in wc for energy  conservation. Pt was left resting in wc with call bell in reach, seat belt alarm on, and all needs met.   Therapy Documentation Precautions:  Precautions Precautions: Fall Precaution Comments: Left hemi; Left homonymous hemianopsia Restrictions Weight Bearing Restrictions: No   Therapy/Group:  Individual Therapy  Janey Genta 06/01/2022, 8:02 AM

## 2022-06-01 NOTE — Progress Notes (Signed)
PROGRESS NOTE   Subjective/Complaints:  Less nocturnal shoulder pain, pt now able to eat with RIght hand  ROS- neg CP, SOB, N/V/D Objective:   No results found. Recent Labs    05/30/22 0606  WBC 7.5  HGB 15.2  HCT 43.0  PLT 149*    Recent Labs    05/30/22 0606  NA 139  K 3.8  CL 104  CO2 25  GLUCOSE 93  BUN 22  CREATININE 1.26*  CALCIUM 8.9     Intake/Output Summary (Last 24 hours) at 06/01/2022 0825 Last data filed at 06/01/2022 0700 Gross per 24 hour  Intake 960 ml  Output --  Net 960 ml         Physical Exam: Vital Signs Blood pressure (!) 160/97, pulse 86, temperature 98 F (36.7 C), temperature source Oral, resp. rate 16, height 5' 11.5" (1.816 m), weight 93.8 kg, SpO2 96 %.   General: No acute distress Mood and affect are appropriate Heart: Regular rate and rhythm no rubs murmurs or extra sounds Lungs: Clear to auscultation, breathing unlabored, no rales or wheezes Abdomen: Positive bowel sounds, soft nontender to palpation, nondistended Extremities: No clubbing, cyanosis, or edema Skin: No evidence of breakdown, no evidence of rash   Neuro: Fairly alert/attentive  Eyes without evidence of nystagmus  Tone is normal without evidence of spasticity Cerebellar exam not tested due to weakness on L   3- to 3+/5 Left delt , bi , tri, 2-/5 finger flexors and extensors  3- to 3/5 Left HF, KE, ADF --stable in appearance Sensory exam absent LT in LUE and LLE, senses pinch LUE   Musculoskeletal: Able to raise RIght arm overhead!  Still with + drop arm test    Assessment/Plan: 1. Functional deficits which require 3+ hours per day of interdisciplinary therapy in a comprehensive inpatient rehab setting. Physiatrist is providing close team supervision and 24 hour management of active medical problems listed below. Physiatrist and rehab team continue to assess barriers to discharge/monitor patient  progress toward functional and medical goals  Care Tool:  Bathing    Body parts bathed by patient: Left arm, Right arm, Chest, Abdomen, Right upper leg, Left upper leg, Front perineal area   Body parts bathed by helper: Right arm, Left arm, Front perineal area, Buttocks, Left lower leg, Right lower leg     Bathing assist Assist Level: Total Assistance - Patient < 25%     Upper Body Dressing/Undressing Upper body dressing   What is the patient wearing?: Pull over shirt    Upper body assist Assist Level: Maximal Assistance - Patient 25 - 49%    Lower Body Dressing/Undressing Lower body dressing      What is the patient wearing?: Pants     Lower body assist Assist for lower body dressing: Total Assistance - Patient < 25%     Toileting Toileting Toileting Activity did not occur Landscape architect and hygiene only): N/A (no void or bm)  Toileting assist Assist for toileting: 2 Helpers     Transfers Chair/bed transfer  Transfers assist     Chair/bed transfer assist level: Moderate Assistance - Patient 50 - 74%     Locomotion  Ambulation   Ambulation assist      Assist level: 2 helpers Assistive device: Other (comment) (Hemi-rail) Max distance: 10'   Walk 10 feet activity   Assist     Assist level: 2 helpers Assistive device: Other (comment) (R hemi-rail)   Walk 50 feet activity   Assist Walk 50 feet with 2 turns activity did not occur: Safety/medical concerns (Patient unable to ambulate >10' at this time secondary to increased fatigue with poor endurance/activity tolerance)         Walk 150 feet activity   Assist Walk 150 feet activity did not occur: Safety/medical concerns         Walk 10 feet on uneven surface  activity   Assist Walk 10 feet on uneven surfaces activity did not occur: Safety/medical concerns         Wheelchair     Assist Is the patient using a wheelchair?: Yes Type of Wheelchair: Manual    Wheelchair  assist level: Contact Guard/Touching assist Max wheelchair distance: 150'    Wheelchair 50 feet with 2 turns activity    Assist        Assist Level: Contact Guard/Touching assist   Wheelchair 150 feet activity     Assist      Assist Level: Contact Guard/Touching assist   Blood pressure (!) 160/97, pulse 86, temperature 98 F (36.7 C), temperature source Oral, resp. rate 16, height 5' 11.5" (1.816 m), weight 93.8 kg, SpO2 96 %.  Medical Problem List and Plan: 1. Functional deficits secondary to right large MCA infarction due to right M2 occlusion status post IR/revascularization as well as history of CVA 2015 with left-sided residual weakness status post loop recorder             -patient may  shower             -ELOS/Goals: 3/28, PT min A, OT min to mod A, SLP supervision- team conf in am            -Continue CIR therapies including PT, OT, and SLP               -Continue work on visual scanning as he appears to have a visual field cut on the left 2.  Antithrombotics: -DVT/anticoagulation:  Pharmaceutical: Eliquis             -antiplatelet therapy: N/A 3. Pain Management: Tylenol as needed  RIght shoulder pain suspect rotator cuff strain vs aggravation of OA, Xray showing evidence of chronic RTC and Glenohumeral OA, discussed pros and cons of injection , pt poor surgical candidate for repair- given pain and poor candidate for stronger opioids. Pain and ROM improved after  Right glenohumeral joint injection with Marcaine and kenalog 4. Mood/Behavior/Sleep: Provide emotional support             -antipsychotic agents: N/A  - sleep chart 5. Neuropsych/cognition: This patient is capable of making decisions on his own behalf. 6. Skin/Wound Care: Routine skin checks 7. Fluids/Electrolytes/Nutrition: Routine in and outs with follow-up chemistries 8.  Dysphagia.  Diet advanced to Dysphagia 3 thin liquids.    9.  Hypertension.  Norvasc 10 mg daily, Toprol-XL 25 mg daily.   Monitor with increased mobility.  Overall well-controlled.  Goal BP less than 180/105, long-term goal normotensive Vitals:   05/31/22 1940 06/01/22 0319  BP: 133/67 (!) 160/97  Pulse: 82 86  Resp: 16 16  Temp: 98.2 F (36.8 C) 98 F (36.7 C)  SpO2: 94% 96%  Controlled 3/13  10.  Atrial fibrillation.  Cardiac rate controlled.  Continue Toprol-xl 25 mg daily On Eliquis  11.  Hyperlipidemia.  Lipitor 20 mg daily 12.  Parotid mass, follow-up outpatient 13.  CKD 3a.    creat improved vs prior       Latest Ref Rng & Units 05/30/2022    6:06 AM 05/19/2022    8:48 AM 05/18/2022    4:54 AM  BMP  Glucose 70 - 99 mg/dL 93  182  127   BUN 8 - 23 mg/dL '22  23  18   '$ Creatinine 0.61 - 1.24 mg/dL 1.26  1.14  0.98   Sodium 135 - 145 mmol/L 139  134  137   Potassium 3.5 - 5.1 mmol/L 3.8  3.7  3.8   Chloride 98 - 111 mmol/L 104  99  104   CO2 22 - 32 mmol/L '25  25  22   '$ Calcium 8.9 - 10.3 mg/dL 8.9  9.2  9.1    14.  Insomnia- Mirtazepine and seroquel - daughter concerned about meds causing MS changes, suspect it is mainly CVA - pt feels like he slept better is more alert today , check sleep graph to document labs look normal     15.  Brief episode of nystagmus - suspect peripheral cause will cont to monitor , has remote R cerebellar infarct on MRI but MRA not showing any sig VB disease  16.  Cognitive issues after Right MCA infarct , UA  and labs neg today, sleep has been disrupted, staff has noted mild agitation/ severe anxiety in the evening prior to starting seroquel     LOS: 14 days A FACE TO [place] E Iola Turri 06/01/2022, 8:25 AM

## 2022-06-01 NOTE — Progress Notes (Signed)
Physical Therapy Session Note  Patient Details  Name: Edward Maynard MRN: [id number] Date of Birth: Jul 22, 1933  Today's Date: 06/01/2022 PT Individual Time: 0810-0908 PT Individual Time Calculation (min): 58 min   Short Term Goals: Week 2:  PT Short Term Goal 1 (Week 2): Pt will perform bed mobility with min assist PT Short Term Goal 2 (Week 2): Pt will perform bed<>chair transfers using LRAD with min assist PT Short Term Goal 3 (Week 2): Pt will perform sit<>stand transfers min assist using LRAD PT Short Term Goal 4 (Week 2): Pt will ambulate at least 8f using LRAD with mod assist of 1 and +2 for safety  Skilled Therapeutic Interventions/Progress Updates:    Pt received awake, supine in bed and agreeable to therapy session. Supine>sitting R EOB, HOB partially elevated, via logroll technique to increase pt independence with max step-by-step cuing for sequencing and mod assist for B LE management and trunk upright. Sitting EOB, with close supervision and 2x min assist for trunk control due to minor posterior LOB while threading on pants, donning L LE ASO, and shoes total/dependent assist. Sit>stand EOB>no UE support with min assist for lifting but heavy mod assist for balance in standing this morning with pt demonstrating strong L anterior trunk lean with poor ability to reorient to midline without the visual feedback of a mirror or other target to show midline. L squat pivot transfer EOB>w/c with mod assist for lifting/pivoting hips - continuing to have pt hold therapist with R hand to avoid increased shoulder pain, cuing for R anterior trunk lean and lifting hips by pushing up through B LEs via mod step-by-step cuing. Transported to main therapy gym. L squat pivot w/c>EOM with pt coming to partial stand this time requiring heavy mod assist for balance and cuing for rotating hips towards L (pt with improved motor planning how to do this) - reinforced education on staying in squat position during the  transfer.  Sit<>stands from EOM with +2 assist guarding on R side for safety and mirror feedback to work on coming to stand and maintaining midline balance without using R UE support - pt progresses to being able to maintain midline stance with min assist and mild postural sway for ~20seconds each trial - worked towards removing mirror while maintaining midline - when pt starts to have LOB requires heavy mod assist to maintain upright and verbal cuing to return to standing tall.   When pt returning to sit, demos significant L lateral lean/LOB with hips landing towards L on mat - requires heavy mod assist to control descent and maintain balance - provided max verbal cuing with visual demonstration on how to improve midline when returning to sit down but only improves once pt allowed visual feedback and trying to keep his R shoulder and R hip touching +2 assist's on that side as external target feedback.  Transported back to room and pt left seated in w/c with needs in reach, seat belt alarm on, and L UE supported on 1/2 lap tray.  Of note: while being transported back to room, pt becomes emotional and tearful stating he is so grateful for the support he has received but is really upset all of this happened - pt's mood improved with discussion of his religious beliefs and listening to part of one of his favorite songs    Therapy Documentation Precautions:  Precautions Precautions: Fall Precaution Comments: Left hemi; Left homonymous hemianopsia Restrictions Weight Bearing Restrictions: No   Pain:  Reports his R  UE is feeling a little better this morning - therapist reinforced education to avoid aggravating movements with that UE - modified interventions to avoid increasing R shoulder pain during session.    Therapy/Group: Individual Therapy  Tawana Scale , PT, DPT, NCS, CSRS 06/01/2022, 7:51 AM

## 2022-06-02 NOTE — Progress Notes (Signed)
Physical Therapy Session Note  Patient Details  Name: Edward Maynard MRN: [id number] Date of Birth: Aug 14, 1933  Today's Date: 06/02/2022 PT Individual Time: 1035-1120 PT Individual Time Calculation (min): 45 min   Short Term Goals: Week 2:  PT Short Term Goal 1 (Week 2): Pt will perform bed mobility with min assist PT Short Term Goal 2 (Week 2): Pt will perform bed<>chair transfers using LRAD with min assist PT Short Term Goal 3 (Week 2): Pt will perform sit<>stand transfers min assist using LRAD PT Short Term Goal 4 (Week 2): Pt will ambulate at least 44ft using LRAD with mod assist of 1 and +2 for safety      Therapy Documentation Precautions:  Precautions Precautions: Fall Precaution Comments: Left hemi; Left homonymous hemianopsia Restrictions Weight Bearing Restrictions: No Pain: denies pain during session - no c/o regarding Rt shoulder before/during/post session.   Mobility:  Pt reports that since his injection, his shoulder has been doing a lot better. No pain during session. FTA: bridging in bed for donning pants- max assist to pull pants up, pt attempting to assist with Rt UE. Supine>sitting mod assist with heavy cues. Sit<>stand mod assist from EOB. Stand pivot to w/c with mod assist and heavy cues for sequencing. Following session, pt up in w/c with call light and belt alarm activated. No c/o after session.   Exercises:  TE: supine TE with PROM>AAROM Rt shoulder and elbow. Elbow flexion/extension, shoulder Abd/Add, IR/ER, flexion. Attempting PROM however pt having difficulty relaxing to allow passive motion. Modified to AAROM in painfree range. Flexion approx 90 degrees, Abd approx 80 degrees, IR abdomin, ER approx 50 degrees.     Therapy/Group: Individual Therapy  Linard Millers 06/02/2022, 12:00 PM

## 2022-06-02 NOTE — Progress Notes (Signed)
Physical Therapy Session Note  Patient Details  Name: Edward Maynard MRN: [id number] Date of Birth: 1934/03/09  Today's Date: 06/02/2022 PT Individual Time: [id number] PT Individual Time Calculation (min): 47 min   Short Term Goals: Week 2:  PT Short Term Goal 1 (Week 2): Pt will perform bed mobility with min assist PT Short Term Goal 2 (Week 2): Pt will perform bed<>chair transfers using LRAD with min assist PT Short Term Goal 3 (Week 2): Pt will perform sit<>stand transfers min assist using LRAD PT Short Term Goal 4 (Week 2): Pt will ambulate at least 35ft using LRAD with mod assist of 1 and +2 for safety  Skilled Therapeutic Interventions/Progress Updates:    Patient received in [facility]. Agreeable to therapy session and reports Rt shoulder pain is present, 6/10, but improving gradually. Pt dependently rolled to main gym for energy conservation. Squat pivot transfer WC>mat table completed to Rt side, cues for head hip relationship and for hand placement to guide transfer/pivot; min assist to completed. Pt performed 10x sit<>stand from mat table with mirror for feedback to obtain midline standing posture. Pt required assist for Lt foot placement prior to power up and able to center to midline with Min assist. Squat pivot transfer Mat>WC towards Rt side, Min assist with cues for technique/hand placement and head hip relationship again. Pt rolled dependently to hallway rail for gait training. Rt UE support on rail and therapist provided Mod-Max assist for Lt LE advancement and weight shift Rt/Lt to facilitate bil steps, WC follow for safety. Pt performed 2 bouts of ~30', occasional standing rest to reset BOS and center balance. Seated rest provided in [facility] and pt dependently transported to room after 2nd bout of gait.  EOS pt requested return to bed and squat pivot transfer completed WC>EOB with mod assist towards Lt side. Pt returned to supine with Min assist to position LE's and cues for use  leg lifter  for Lt LE onto bed. EOS alarm on, call bell within reach, and pt with no complaints watching golf channel.  Therapy Documentation Precautions:  Precautions Precautions: Fall Precaution Comments: Left hemi; Left homonymous hemianopsia Restrictions Weight Bearing Restrictions: No     Therapy/Group: Individual Therapy  Verner Mould, DPT Acute Rehabilitation Services Office [phone]  06/02/22 7:34 AM

## 2022-06-02 NOTE — Progress Notes (Signed)
Patient ID: Edward Maynard, male   DOB: 01-Sep-1933, 89 y.o.   MRN: [id number]  Current bed offers sent to daughter, [name] at [email or web address]. Authorization request will be started on Monday  3/18. Sw will FU with daughter and family on their preference of facility. Daughter aware if authorization not approved, CIR will have to continue with a d/c home. SW will FU with daughter on 3/18.

## 2022-06-02 NOTE — Progress Notes (Signed)
Physical Therapy Session Note  Patient Details  Name: Edward Maynard MRN: [id number] Date of Birth: 1933/11/12  Today's Date: 06/02/2022 PT Individual Time: [id number] PT Individual Time Calculation (min): 26 min   Short Term Goals: Week 1:  PT Short Term Goals - Week 1 PT Short Term Goal 1 (Week 1): Patient will perform bed mobility with MinA PT Short Term Goal 1 - Progress (Week 1): Progressing toward goal PT Short Term Goal 2 (Week 1): Patient will perform bed/chair transfer with LRAD and ModA PT Short Term Goal 2 - Progress (Week 1): Met PT Short Term Goal 3 (Week 1): Patient will ambulate 67' with LRAD and MaxA x1 PT Short Term Goal 3 - Progress (Week 1): Progressing toward goal PT Short Term Goals - Week 2 PT Short Term Goal 1 (Week 2): Pt will perform bed mobility with min assist PT Short Term Goal 2 (Week 2): Pt will perform bed<>chair transfers using LRAD with min assist PT Short Term Goal 3 (Week 2): Pt will perform sit<>stand transfers min assist using LRAD PT Short Term Goal 4 (Week 2): Pt will ambulate at least 39ft using LRAD with mod assist of 1 and +2 for safety  Skilled Therapeutic Interventions/Progress Updates:  Patient seated upright in w/c on entrance to room. Patient alert and agreeable to PT session. Two friends leaving room on arrival and one remaining throughout session.   Patient with mild pain complaint at R shoulder at start of session.  Therapeutic Activity: Transfers: Pt performed sit<>stand transfers throughout session with [place] for balance and coordination. +2 available for safety and providing support assist for maintaining balance in stance.  Provided verbal cues for posture, maintaining L foot contact with floor, L knee extension for improved weight bearing and balance shift to R/ midline.  Neuromuscular Re-ed: NMR facilitated during session with focus on standing balance. Pt guided in rise to stand while maintaining L foot on floor with L knee  extension. Attempt for fwd/ bkwd stepping and pt  unable to adequately step bkwd with ataxic move into knee flexion with each attempt. Pt also cued to extend L knee in stance with lean to R side. Pt performs trunk/ hip flexion with attempt to straighten knee. Is able to self correct with vc and increased effort for 2 attempts but otherwise requires descent to sit in order to "reset". NMR performed for improvements in motor control and coordination, balance, sequencing, judgement, and self confidence/ efficacy in performing all aspects of mobility at highest level of independence.   Patient seated upright in w/c at end of session with brakes locked, belt alarm set, lunch tray set in front of pt and all needs within reach. NT notified as to setup of lunch and request to double-check on pt soon.    Therapy Documentation Precautions:  Precautions Precautions: Fall Precaution Comments: Left hemi; Left homonymous hemianopsia Restrictions Weight Bearing Restrictions: No General:   Vital Signs:   Pain:  Pt relates pain at shoulder increases slightly during session but decreases with rest and maintaining arm position at neutral.   Therapy/Group: Individual Therapy  Alger Simons PT, DPT, CSRS 06/02/2022, 12:38 PM

## 2022-06-02 NOTE — Progress Notes (Signed)
PROGRESS NOTE   Subjective/Complaints:  Discussed functional prognosis at tleast in the short term, will need 24/7 physical assist at home  ROS- neg CP, SOB, N/V/D Objective:   No results found. No results for input(s): "WBC", "HGB", "HCT", "PLT" in the last 72 hours.  No results for input(s): "NA", "K", "CL", "CO2", "GLUCOSE", "BUN", "CREATININE", "CALCIUM" in the last 72 hours.   Intake/Output Summary (Last 24 hours) at 06/02/2022 0857 Last data filed at 06/02/2022 0732 Gross per 24 hour  Intake 625 ml  Output --  Net 625 ml         Physical Exam: Vital Signs Blood pressure 118/83, pulse 71, temperature 98.5 F (36.9 C), temperature source Oral, resp. rate 14, height 5' 11.5" (1.816 m), weight 93.6 kg, SpO2 94 %.   General: No acute distress Mood and affect are appropriate Heart: Regular rate and rhythm no rubs murmurs or extra sounds Lungs: Clear to auscultation, breathing unlabored, no rales or wheezes Abdomen: Positive bowel sounds, soft nontender to palpation, nondistended Extremities: No clubbing, cyanosis, or edema Skin: No evidence of breakdown, no evidence of rash   Neuro: Fairly alert/attentive  Eyes without evidence of nystagmus  Tone is normal without evidence of spasticity Cerebellar exam not tested due to weakness on L   3- to 3+/5 Left delt , bi , tri, 2-/5 finger flexors and extensors  3- to 3/5 Left HF, KE, ADF --stable in appearance Sensory exam absent LT in LUE and LLE, senses pinch LUE   Musculoskeletal: Able to raise RIght arm overhead!  Still with + drop arm test    Assessment/Plan: 1. Functional deficits which require 3+ hours per day of interdisciplinary therapy in a comprehensive inpatient rehab setting. Physiatrist is providing close team supervision and 24 hour management of active medical problems listed below. Physiatrist and rehab team continue to assess barriers to  discharge/monitor patient progress toward functional and medical goals  Care Tool:  Bathing    Body parts bathed by patient: Left arm, Right arm, Chest, Abdomen, Right upper leg, Left upper leg, Front perineal area   Body parts bathed by helper: Right arm, Left arm, Front perineal area, Buttocks, Left lower leg, Right lower leg     Bathing assist Assist Level: Total Assistance - Patient < 25%     Upper Body Dressing/Undressing Upper body dressing   What is the patient wearing?: Pull over shirt    Upper body assist Assist Level: Maximal Assistance - Patient 25 - 49%    Lower Body Dressing/Undressing Lower body dressing      What is the patient wearing?: Pants     Lower body assist Assist for lower body dressing: Total Assistance - Patient < 25%     Toileting Toileting Toileting Activity did not occur Landscape architect and hygiene only): N/A (no void or bm)  Toileting assist Assist for toileting: 2 Helpers     Transfers Chair/bed transfer  Transfers assist     Chair/bed transfer assist level: Moderate Assistance - Patient 50 - 74% (squat pivot)     Locomotion Ambulation   Ambulation assist      Assist level: 2 helpers Assistive device: Other (comment) (+  2 R HHA) Max distance: 29ft   Walk 10 feet activity   Assist     Assist level: 2 helpers Assistive device: Other (comment) (R hemi-rail)   Walk 50 feet activity   Assist Walk 50 feet with 2 turns activity did not occur: Safety/medical concerns (Patient unable to ambulate >10' at this time secondary to increased fatigue with poor endurance/activity tolerance)         Walk 150 feet activity   Assist Walk 150 feet activity did not occur: Safety/medical concerns         Walk 10 feet on uneven surface  activity   Assist Walk 10 feet on uneven surfaces activity did not occur: Safety/medical concerns         Wheelchair     Assist Is the patient using a wheelchair?: Yes Type  of Wheelchair: Manual    Wheelchair assist level: Contact Guard/Touching assist Max wheelchair distance: 150'    Wheelchair 50 feet with 2 turns activity    Assist        Assist Level: Contact Guard/Touching assist   Wheelchair 150 feet activity     Assist      Assist Level: Contact Guard/Touching assist   Blood pressure 118/83, pulse 71, temperature 98.5 F (36.9 C), temperature source Oral, resp. rate 14, height 5' 11.5" (1.816 m), weight 93.6 kg, SpO2 94 %.  Medical Problem List and Plan: 1. Functional deficits secondary to right large MCA infarction due to right M2 occlusion status post IR/revascularization as well as history of CVA 2015 with left-sided residual weakness status post loop recorder             -patient may  shower             -ELOS/Goals: 3/28, PT min A, OT min to mod A, SLP supervision-Daughter to come in over the weekend to observe in therapy.  Wife and son in law staying with pt's wife in the evenings due to her cognitive issues           -Continue CIR therapies including PT, OT, and SLP               -Continue work on visual scanning as he appears to have a visual field cut on the left 2.  Antithrombotics: -DVT/anticoagulation:  Pharmaceutical: Eliquis             -antiplatelet therapy: N/A 3. Pain Management: Tylenol as needed  RIght shoulder pain suspect rotator cuff strain vs aggravation of OA, Xray showing evidence of chronic RTC and Glenohumeral OA, discussed pros and cons of injection , pt poor surgical candidate for repair- given pain and poor candidate for stronger opioids. Pain and ROM improved after  Right glenohumeral joint injection with Marcaine and kenalog 4. Mood/Behavior/Sleep: Provide emotional support             -antipsychotic agents: N/A  - sleep chart 5. Neuropsych/cognition: This patient is capable of making decisions on his own behalf. 6. Skin/Wound Care: Routine skin checks 7. Fluids/Electrolytes/Nutrition: Routine in  and outs with follow-up chemistries 8.  Dysphagia.  Diet advanced to Dysphagia 3 thin liquids.    9.  Hypertension.  Norvasc 10 mg daily, Toprol-XL 25 mg daily.  Monitor with increased mobility.  Overall well-controlled.  Goal BP less than 180/105, long-term goal normotensive Vitals:   06/01/22 2122 06/02/22 0546  BP: 125/75 118/83  Pulse: 80 71  Resp: 18 14  Temp: 97.6 F (36.4 C) 98.5 F (36.9 C)  SpO2: 95% 94%  Controlled 3/15  10.  Atrial fibrillation.  Cardiac rate controlled.  Continue Toprol-xl 25 mg daily On Eliquis  11.  Hyperlipidemia.  Lipitor 20 mg daily 12.  Parotid mass, follow-up outpatient 13.  CKD 3a.    creat improved vs prior       Latest Ref Rng & Units 05/30/2022    6:06 AM 05/19/2022    8:48 AM 05/18/2022    4:54 AM  BMP  Glucose 70 - 99 mg/dL 93  182  127   BUN 8 - 23 mg/dL 22  23  18    Creatinine 0.61 - 1.24 mg/dL 1.26  1.14  0.98   Sodium 135 - 145 mmol/L 139  134  137   Potassium 3.5 - 5.1 mmol/L 3.8  3.7  3.8   Chloride 98 - 111 mmol/L 104  99  104   CO2 22 - 32 mmol/L 25  25  22    Calcium 8.9 - 10.3 mg/dL 8.9  9.2  9.1    14.  Insomnia- Mirtazepine and seroquel - daughter concerned about meds causing MS changes, suspect it is mainly CVA - pt feels like he slept better is more alert today , check sleep graph to document labs look normal     15.  Brief episode of nystagmus - suspect peripheral cause will cont to monitor , has remote R cerebellar infarct on MRI but MRA not showing any sig VB disease  16.  Cognitive issues after Right MCA infarct , UA  and labs neg today, sleep has been disrupted, staff has noted mild agitation/ severe anxiety in the evening prior to starting seroquel     LOS: 15 days A FACE TO [place] Shantice Menger 06/02/2022, 8:57 AM

## 2022-06-02 NOTE — Progress Notes (Signed)
Occupational Therapy Session Note  Patient Details  Name: Edward Maynard MRN: [id number] Date of Birth: 1933-07-18  Today's Date: 06/02/2022 OT Individual Time: 1300-1400 OT Individual Time Calculation (min): 60 min    Short Term Goals: Week 1:  OT Short Term Goal 1 (Week 1): Pt will maintain dyanmic sitting balance with CGA during BADLs/functional activities OT Short Term Goal 1 - Progress (Week 1): Met OT Short Term Goal 2 (Week 1): Pt will located 5/5 items on L side of sink/tray/table with min cueing OT Short Term Goal 2 - Progress (Week 1): Progressing toward goal OT Short Term Goal 3 (Week 1): Pt will complete UB dressing min A OT Short Term Goal 3 - Progress (Week 1): Progressing toward goal OT Short Term Goal 4 (Week 1): Pt will self-feed with min A OT Short Term Goal 4 - Progress (Week 1): Progressing toward goal Week 2:  OT Short Term Goal 1 (Week 2): Pt will complete UB bathing min A OT Short Term Goal 2 (Week 2): Pt will attend to L visual field/environment/body during BADLs/functional tasks with min cueing  Skilled Therapeutic Interventions/Progress Updates:    1:1 Self care retraining at shower level . Pt received in the w/c. Pt doffed shirt with mod intructional cues with mod A due to shoulder discomfort on the right. Pt performed scoot/ squat pivot transfer with grab bar to cut out shower bench with min A with extra time and total cues for body set up and body awareness of left side. Pt continues to be impacted by decr sensation in left Ue and LE and not knowing where they are positioned in prep for activity and requires A for recall. Focus on functional use of left Ue to wash right UE, bilateral thighs, chest and stomach with min A for management of wash cloth. Pt remained seated and Ot assisted with washing buttocks and lower legs. Pt performed transfer to the right out of shower with min A with mod cues for sequencing and safety and placement of feet. Dressing with focus on  hemi dressing techniques and backwards chaining. Pt with difficulty due to perceptual deficits and difficulty with visual attention to the left requiring mod cues. Sit to stand at sink with min A; min A for standing balance with right UE support on sink - OT assisted with clothing management. Pt left sitting up in w/c in prep for next session.   Therapy Documentation Precautions:  Precautions Precautions: Fall Precaution Comments: Left hemi; Left homonymous hemianopsia Restrictions Weight Bearing Restrictions: No General:   Vital Signs:   Pain:  Reports pain and discouragement with his right hand. Able to provide rest when needed and limited hard strained use of  UE in transfers and ADLs   Therapy/Group: Individual Therapy  Willeen Cass [facility] 06/02/2022, 2:03 PM

## 2022-06-03 NOTE — Progress Notes (Signed)
PROGRESS NOTE   Subjective/Complaints:  No acute complaints.  No events overnight.  Labs and vital stable.  Sitting in room with family.  Educated family on need to preserve right upper extremity function due to left upper extremity weakness, including good ergonomics/comfortable positioning for meals.  Made difficult by rotator cuff tear and right upper extremity, pain.  ROS- neg CP, SOB, N/V/D. + Right upper extremity discomfort -secondary to rotator cuff tear, ongoing.  Objective:   No results found. No results for input(s): "WBC", "HGB", "HCT", "PLT" in the last 72 hours.  No results for input(s): "NA", "K", "CL", "CO2", "GLUCOSE", "BUN", "CREATININE", "CALCIUM" in the last 72 hours.   Intake/Output Summary (Last 24 hours) at 06/03/2022 2021 Last data filed at 06/03/2022 2010 Gross per 24 hour  Intake 714 ml  Output 2575 ml  Net -1861 ml         Physical Exam: Vital Signs Blood pressure 135/69, pulse 81, temperature 97.7 F (36.5 C), temperature source Oral, resp. rate 20, height 5' 11.5" (1.816 m), weight 93.6 kg, SpO2 94 %.   General: No acute distress Mood and affect are appropriate Heart: Regular rate and rhythm no rubs murmurs or extra sounds Lungs: Clear to auscultation, breathing unlabored, no rales or wheezes Abdomen: Positive bowel sounds, soft nontender to palpation, nondistended Extremities: No clubbing, cyanosis, or edema Skin: No evidence of breakdown, no evidence of rash  Psych: Pleasant mood, appropriate affect. Neuro: Fairly alert/attentive  Eyes without evidence of nystagmus  Tone is normal without evidence of spasticity Cerebellar exam not tested due to weakness on L  3/16: Moving all 4 limbs antigravity in bed. 3- to 3+/5 Left delt , bi , tri, 2-/5 finger flexors and extensors  3- to 3/5 Left HF, KE, ADF --stable in appearance Sensory exam absent LT in LUE and LLE, senses pinch LUE    Musculoskeletal: Able to raise RIght arm overhead!  Still with + drop arm test    Assessment/Plan: 1. Functional deficits which require 3+ hours per day of interdisciplinary therapy in a comprehensive inpatient rehab setting. Physiatrist is providing close team supervision and 24 hour management of active medical problems listed below. Physiatrist and rehab team continue to assess barriers to discharge/monitor patient progress toward functional and medical goals  Care Tool:  Bathing    Body parts bathed by patient: Left arm, Right arm, Chest, Abdomen, Right upper leg, Left upper leg, Front perineal area, Face   Body parts bathed by helper: Buttocks, Right lower leg, Left lower leg     Bathing assist Assist Level: Minimal Assistance - Patient > 75%     Upper Body Dressing/Undressing Upper body dressing   What is the patient wearing?: Pull over shirt    Upper body assist Assist Level: Moderate Assistance - Patient 50 - 74%    Lower Body Dressing/Undressing Lower body dressing      What is the patient wearing?: Underwear/pull up, Pants     Lower body assist Assist for lower body dressing: Maximal Assistance - Patient 25 - 49%     Toileting Toileting Toileting Activity did not occur (Clothing management and hygiene only): N/A (no void or bm)  Toileting assist Assist for toileting: 2 Helpers     Transfers Chair/bed transfer  Transfers assist     Chair/bed transfer assist level: Moderate Assistance - Patient 50 - 74%     Locomotion Ambulation   Ambulation assist      Assist level: 2 helpers Assistive device:  (hall rail) Max distance: 30   Walk 10 feet activity   Assist     Assist level: 2 helpers Assistive device:  (hall rail)   Walk 50 feet activity   Assist Walk 50 feet with 2 turns activity did not occur: Safety/medical concerns (Patient unable to ambulate >10' at this time secondary to increased fatigue with poor endurance/activity  tolerance)         Walk 150 feet activity   Assist Walk 150 feet activity did not occur: Safety/medical concerns         Walk 10 feet on uneven surface  activity   Assist Walk 10 feet on uneven surfaces activity did not occur: Safety/medical concerns         Wheelchair     Assist Is the patient using a wheelchair?: Yes Type of Wheelchair: Manual    Wheelchair assist level: Contact Guard/Touching assist Max wheelchair distance: 150'    Wheelchair 50 feet with 2 turns activity    Assist        Assist Level: Contact Guard/Touching assist   Wheelchair 150 feet activity     Assist      Assist Level: Contact Guard/Touching assist   Blood pressure 135/69, pulse 81, temperature 97.7 F (36.5 C), temperature source Oral, resp. rate 20, height 5' 11.5" (1.816 m), weight 93.6 kg, SpO2 94 %.  Medical Problem List and Plan: 1. Functional deficits secondary to right large MCA infarction due to right M2 occlusion status post IR/revascularization as well as history of CVA 2015 with left-sided residual weakness status post loop recorder             -patient may  shower             -ELOS/Goals: 3/28, PT min A, OT min to mod A, SLP supervision-Daughter to come in over the weekend to observe in therapy.  Wife and son in law staying with pt's wife in the evenings due to her cognitive issues           -Continue CIR therapies including PT, OT, and SLP               -Continue work on visual scanning as he appears to have a visual field cut on the left 2.  Antithrombotics: -DVT/anticoagulation:  Pharmaceutical: Eliquis             -antiplatelet therapy: N/A 3. Pain Management: Tylenol as needed  RIght shoulder pain suspect rotator cuff strain vs aggravation of OA, Xray showing evidence of chronic RTC and Glenohumeral OA, discussed pros and cons of injection , pt poor surgical candidate for repair- given pain and poor candidate for stronger opioids. Pain and ROM  improved after  Right glenohumeral joint injection with Marcaine and kenalog 4. Mood/Behavior/Sleep: Provide emotional support             -antipsychotic agents: N/A  - sleep chart 5. Neuropsych/cognition: This patient is capable of making decisions on his own behalf. 6. Skin/Wound Care: Routine skin checks 7. Fluids/Electrolytes/Nutrition: Routine in and outs with follow-up chemistries 8.  Dysphagia.  Diet advanced to Dysphagia 3 thin liquids.    9.  Hypertension.  Norvasc 10  mg daily, Toprol-XL 25 mg daily.  Monitor with increased mobility.  Overall well-controlled.  Goal BP less than 180/105, long-term goal normotensive Vitals:   06/03/22 1420 06/03/22 2011  BP: (!) 146/84 135/69  Pulse: 77 81  Resp: 17 20  Temp: 98 F (36.7 C) 97.7 F (36.5 C)  SpO2: 94% 94%  Controlled 3/15  10.  Atrial fibrillation.  Cardiac rate controlled.  Continue Toprol-xl 25 mg daily On Eliquis  11.  Hyperlipidemia.  Lipitor 20 mg daily 12.  Parotid mass, follow-up outpatient 13.  CKD 3a.    creat improved vs prior       Latest Ref Rng & Units 05/30/2022    6:06 AM 05/19/2022    8:48 AM 05/18/2022    4:54 AM  BMP  Glucose 70 - 99 mg/dL 93  182  127   BUN 8 - 23 mg/dL 22  23  18    Creatinine 0.61 - 1.24 mg/dL 1.26  1.14  0.98   Sodium 135 - 145 mmol/L 139  134  137   Potassium 3.5 - 5.1 mmol/L 3.8  3.7  3.8   Chloride 98 - 111 mmol/L 104  99  104   CO2 22 - 32 mmol/L 25  25  22    Calcium 8.9 - 10.3 mg/dL 8.9  9.2  9.1    14.  Insomnia- Mirtazepine and seroquel - daughter concerned about meds causing MS changes, suspect it is mainly CVA - pt feels like he slept better is more alert today , check sleep graph to document labs look normal     15.  Brief episode of nystagmus - suspect peripheral cause will cont to monitor , has remote R cerebellar infarct on MRI but MRA not showing any sig VB disease  16.  Cognitive issues after Right MCA infarct , UA  and labs neg today, sleep has been disrupted, staff  has noted mild agitation/ severe anxiety in the evening prior to starting seroquel     LOS: 16 days A FACE TO [place] 06/03/2022, 8:21 PM

## 2022-06-03 NOTE — Progress Notes (Signed)
Physical Therapy Session Note  Patient Details  Name: Edward Maynard MRN: [id number] Date of Birth: [birth year]  Today's Date: 06/03/2022 PT Individual Time: 0921-1014 PT Individual Time Calculation (min): 53 min   Short Term Goals: Week 2:  PT Short Term Goal 1 (Week 2): Pt will perform bed mobility with min assist PT Short Term Goal 2 (Week 2): Pt will perform bed<>chair transfers using LRAD with min assist PT Short Term Goal 3 (Week 2): Pt will perform sit<>stand transfers min assist using LRAD PT Short Term Goal 4 (Week 2): Pt will ambulate at least 51ft using LRAD with mod assist of 1 and +2 for safety  Skilled Therapeutic Interventions/Progress Updates:    Pt received sitting in w/c with his daughter, [name], and son-in-law, Richardson Landry, present for hands-on family education/training to determine if family feels they can safely provide pt's level of care at home. Pt agreeable to therapy session.   [place] educated on: - absent/severely impaired L hemibody sensation and impact on pt's mobility and importance of skin integrity assessments and skin protection devices (such as elbow pad) - L inattention with impact on pt's mobility - CLOF with options for transfer technique of either squat pivot vs purchase of sit<>stand device  - plan for custom manual wheelchair evaluation/consult on Wednesday 3/20 - use of L ankle ASO for gait training but not needed for transfers  Donned B LE TED hose and shoes total assist for time management.  Therapist provided visual demonstration with education on how to perform bed<>wheelchair squat pivot transfers including the following components:  - how to set-up/position wheelchair - management of wheelchair parts, briefly - use of gait belt  - caregiver's assistance to place L hemibody and ensure it's safety - pt's head/hips relationship - positioning to provide pt assistance  - performing the transfer in 2 motions, rather than one big pivot    Pt  performed bed<>wheelchair transfer x3 with therapist providing mod assist while reinforcing education to family on how to assist patient with this transfer. Pt then completed x3 squat pivot transfers with pt's son-in-law providing hands-on assistance and he was demonstrating excellent learning with excellent questions and carryover of cuing to subsequent transfers - therapist providing heavy min assist each time they completed transfer towards pt's L side for safety but only CGA when transferring towards pt's R side.  At end of session, pt's family expressing concerns about them being able to safely provide pt the level of care he needs at this time. Discussed continued plan while in CIR, to continue progressing patient and focusing on transfers and standing balance to improve his independence with the necessary functional mobility tasks that will need to be performed at D/C. Will allow family to reflect on training today and follow-up with SW next week. At end of session, pt left seated in w/c with needs in reach and family present.     Therapy Documentation Precautions:  Precautions Precautions: Fall Precaution Comments: Left hemi; Left homonymous hemianopsia Restrictions Weight Bearing Restrictions: No   Pain:  No reports of pain throughout session - had pt modify R hand placement during all mobility tasks to avoid R shoulder pain.     Therapy/Group: Individual Therapy  Tawana Scale , PT, DPT, NCS, CSRS 06/03/2022, 7:54 AM

## 2022-06-03 NOTE — Progress Notes (Signed)
Occupational Therapy Session Note  Patient Details  Name: Edward Maynard MRN: [id number] Date of Birth: November 20, 1933  Today's Date: 06/03/2022 OT Individual Time: 1120-1208 OT Individual Time Calculation (min): 48 min    Short Term Goals: Week 2:  OT Short Term Goal 1 (Week 2): Pt will complete UB bathing min A OT Short Term Goal 2 (Week 2): Pt will attend to L visual field/environment/body during BADLs/functional tasks with min cueing  Skilled Therapeutic Interventions/Progress Updates:    Family education session completed with pt and his daughter and son in law. Verbal education provided re fall risk reduction, energy conservation strategies, home carryover of transfer training, ADLs, and IADLs. Demonstration and hands on training completed for pt performance of UB/LB bathing and dressing at mod A level, toileting hygiene and transfers, and shower transfers. Provided demonstration of use of the stedy as a potential DME use at home- pt able to stand with as little as min A, management for LUE and significant cueing for awareness of LUE/LLE. While standing in the stedy he was able to pull up/down pants with mod A, however when distracted his L lateral lean worsened and he required mod A to remain standing, as compared to CGA when attending to task. He was able to bring himself back into midline with cueing. He completed sit > stand at the sink with min A as well to demo solutions to home bathing. He was taken to the ADL apt for tub transfer. Demo of TTB provided. Pt with significant motor planning deficits, requiring heavy mod A to stand pivot to the TTB and to squat pivot (2nd trial. He was able to bring his BLE into the tub with close CGA d/t large posterior lean. He demonstrated unsafe scooting forward and required physical blocking to not scoot off the bench. He returned to his room. His son in law was able to demonstrate a safe squat pivot transfer back to bed with min cueing from OT for  simplifying cueing/instruction. He was left supine with all needs met. Home measurement sheet provided.    Therapy Documentation Precautions:  Precautions Precautions: Fall Precaution Comments: Left hemi; Left homonymous hemianopsia Restrictions Weight Bearing Restrictions: No  Therapy/Group: Individual Therapy  Curtis Sites 06/03/2022, 7:53 AM

## 2022-06-03 NOTE — Progress Notes (Signed)
Speech Language Pathology Daily Session Note  Patient Details  Name: ERJON KLUESNER MRN: [id number] Date of Birth: 1933/12/26  Today's Date: 06/03/2022 SLP Individual Time: [id number] SLP Individual Time Calculation (min): 45 min  Short Term Goals: Week 3: SLP Short Term Goal 1 (Week 3): Pt will trial regular textures with functional oral phase and no overt s/sx concering for airway intrusion with Mod I for implementation of safe swallowing strategies. SLP Short Term Goal 2 (Week 3): Patient will scan left of midline during functional tasks with mod A verbal cues to achieve 70% accuracy SLP Short Term Goal 3 (Week 3): Pt will complete basic, functional problem-solving tasks with 70% accuracy given Min-Mod A verbal and/or visual cues.  Skilled Therapeutic Interventions: Skilled treatment session focused on initiation of family education with the patient, his daughter, and son-in-law. Throughout session, but was verbose and required frequent redirection to stay on task and for topic maintenance. SLP sat in patient's left visual field and provided overall mod A verbal cues for patient to maintain eye contact during conversation. Patient reporting difficulty keeping up with the date, therefore, SLP provided patient with a calendar that he was able to utilize with Min verbal cues. SLP also utilized a visual anchor and overall Min-Mod verbal cues for left visual scanning throughout task so patient can add larger numbers to the calendar for ease of use. SLP provided education to the patient's family regarding patient's current cognitive deficits and their impact on his function, especially in regards to decreased attention and visual scanning/awareness. SLP also provided education regarding patient's current swallowing function, diet recommendations, appropriate textures, medication administration nad swallowing compensatory strategies. All verbalized understanding and handouts were given to reinforce  information. Patient left upright in wheelchair with family present. Continue with current plan of care.      Pain Pain Assessment Pain Score: 0-No pain  Therapy/Group: Individual Therapy  Vivan Agostino 06/03/2022, 12:28 PM

## 2022-06-04 NOTE — Progress Notes (Signed)
PROGRESS NOTE   Subjective/Complaints:  No events overnight.  Patient endorses some ongoing urinary urgency, no issues with pain.  No dysuria.  Overall, concerned regarding his function and discharge, unsure if he will be able to manage at home.  ROS- neg CP, SOB, N/V/D. + Right upper extremity discomfort -ok today Objective:   No results found. No results for input(s): "WBC", "HGB", "HCT", "PLT" in the last 72 hours.  No results for input(s): "NA", "K", "CL", "CO2", "GLUCOSE", "BUN", "CREATININE", "CALCIUM" in the last 72 hours.   Intake/Output Summary (Last 24 hours) at 06/04/2022 1534 Last data filed at 06/04/2022 1355 Gross per 24 hour  Intake 595 ml  Output 1300 ml  Net -705 ml         Physical Exam: Vital Signs Blood pressure 123/72, pulse 75, temperature 98.4 F (36.9 C), resp. rate 20, height 5' 11.5" (1.816 m), weight 93.6 kg, SpO2 90 %.   General: No acute distress Mood and affect are appropriate Heart: Regular rate and rhythm no rubs murmurs or extra sounds Lungs: Clear to auscultation, breathing unlabored, no rales or wheezes Abdomen: Positive bowel sounds, soft nontender to palpation, nondistended Extremities: No clubbing, cyanosis, or edema Skin: No evidence of breakdown, no evidence of rash  Psych: Pleasant mood, appropriate affect. Neuro: Fairly alert/attentive, oriented x 3  Eyes without evidence of nystagmus  Tone is normal without evidence of spasticity Cerebellar exam not tested due to weakness on L  3/16: Moving all 4 limbs antigravity in bed. 3- to 3+/5 Left delt , bi , tri, 2-/5 finger flexors and extensors  3- to 3/5 Left HF, KE, ADF --stable in appearance Sensory exam absent LT in LUE and LLE, senses pinch LUE   Musculoskeletal: Normal passive range of motion right upper extremity, mild tenderness and deformity apparent on palpation.   Assessment/Plan: 1. Functional deficits which  require 3+ hours per day of interdisciplinary therapy in a comprehensive inpatient rehab setting. Physiatrist is providing close team supervision and 24 hour management of active medical problems listed below. Physiatrist and rehab team continue to assess barriers to discharge/monitor patient progress toward functional and medical goals  Care Tool:  Bathing    Body parts bathed by patient: Left arm, Right arm, Chest, Abdomen, Right upper leg, Left upper leg, Front perineal area, Face   Body parts bathed by helper: Buttocks, Right lower leg, Left lower leg     Bathing assist Assist Level: Minimal Assistance - Patient > 75%     Upper Body Dressing/Undressing Upper body dressing   What is the patient wearing?: Pull over shirt    Upper body assist Assist Level: Moderate Assistance - Patient 50 - 74%    Lower Body Dressing/Undressing Lower body dressing      What is the patient wearing?: Underwear/pull up, Pants     Lower body assist Assist for lower body dressing: Maximal Assistance - Patient 25 - 49%     Toileting Toileting Toileting Activity did not occur (Clothing management and hygiene only): N/A (no void or bm)  Toileting assist Assist for toileting: 2 Helpers     Transfers Chair/bed transfer  Transfers assist     Chair/bed  transfer assist level: Moderate Assistance - Patient 50 - 74%     Locomotion Ambulation   Ambulation assist      Assist level: 2 helpers Assistive device:  (hall rail) Max distance: 30   Walk 10 feet activity   Assist     Assist level: 2 helpers Assistive device:  (hall rail)   Walk 50 feet activity   Assist Walk 50 feet with 2 turns activity did not occur: Safety/medical concerns (Patient unable to ambulate >10' at this time secondary to increased fatigue with poor endurance/activity tolerance)         Walk 150 feet activity   Assist Walk 150 feet activity did not occur: Safety/medical concerns         Walk 10  feet on uneven surface  activity   Assist Walk 10 feet on uneven surfaces activity did not occur: Safety/medical concerns         Wheelchair     Assist Is the patient using a wheelchair?: Yes Type of Wheelchair: Manual    Wheelchair assist level: Contact Guard/Touching assist Max wheelchair distance: 150'    Wheelchair 50 feet with 2 turns activity    Assist        Assist Level: Contact Guard/Touching assist   Wheelchair 150 feet activity     Assist      Assist Level: Contact Guard/Touching assist   Blood pressure 123/72, pulse 75, temperature 98.4 F (36.9 C), resp. rate 20, height 5' 11.5" (1.816 m), weight 93.6 kg, SpO2 90 %.  Medical Problem List and Plan: 1. Functional deficits secondary to right large MCA infarction due to right M2 occlusion status post IR/revascularization as well as history of CVA 2015 with left-sided residual weakness status post loop recorder             -patient may  shower             -ELOS/Goals: 3/28, PT min A, OT min to mod A, SLP supervision-Daughter to come in over the weekend to observe in therapy.  Wife and son in law staying with pt's wife in the evenings due to her cognitive issues           -Continue CIR therapies including PT, OT, and SLP               -Continue work on visual scanning as he appears to have a visual field cut on the left 2.  Antithrombotics: -DVT/anticoagulation:  Pharmaceutical: Eliquis             -antiplatelet therapy: N/A 3. Pain Management: Tylenol as needed  RIght shoulder pain suspect rotator cuff strain vs aggravation of OA, Xray showing evidence of chronic RTC and Glenohumeral OA, discussed pros and cons of injection , pt poor surgical candidate for repair- given pain and poor candidate for stronger opioids. Pain and ROM improved after  Right glenohumeral joint injection with Marcaine and kenalog  - 3/17: Seems to be improving, less complaints of pain this a.m.  4. Mood/Behavior/Sleep:  Provide emotional support             -antipsychotic agents: N/A  - sleep chart 5. Neuropsych/cognition: This patient is capable of making decisions on his own behalf. 6. Skin/Wound Care: Routine skin checks 7. Fluids/Electrolytes/Nutrition: Routine in and outs with follow-up chemistries 8.  Dysphagia.  Diet advanced to Dysphagia 3 thin liquids.    9.  Hypertension.  Norvasc 10 mg daily, Toprol-XL 25 mg daily.  Monitor with increased mobility.  Overall well-controlled.  Goal BP less than 180/105, long-term goal normotensive Vitals:   06/04/22 0513 06/04/22 1239  BP: (!) 148/81 123/72  Pulse: 73 75  Resp: 20 20  Temp: 98.2 F (36.8 C) 98.4 F (36.9 C)  SpO2: 94% 90%  Controlled 3/15  10.  Atrial fibrillation.  Cardiac rate controlled.  Continue Toprol-xl 25 mg daily On Eliquis  11.  Hyperlipidemia.  Lipitor 20 mg daily 12.  Parotid mass, follow-up outpatient 13.  CKD 3a.    creat improved vs prior       Latest Ref Rng & Units 05/30/2022    6:06 AM 05/19/2022    8:48 AM 05/18/2022    4:54 AM  BMP  Glucose 70 - 99 mg/dL 93  182  127   BUN 8 - 23 mg/dL 22  23  18    Creatinine 0.61 - 1.24 mg/dL 1.26  1.14  0.98   Sodium 135 - 145 mmol/L 139  134  137   Potassium 3.5 - 5.1 mmol/L 3.8  3.7  3.8   Chloride 98 - 111 mmol/L 104  99  104   CO2 22 - 32 mmol/L 25  25  22    Calcium 8.9 - 10.3 mg/dL 8.9  9.2  9.1    14.  Insomnia- Mirtazepine and seroquel - daughter concerned about meds causing MS changes, suspect it is mainly CVA - pt feels like he slept better is more alert today , check sleep graph to document labs look normal     15.  Brief episode of nystagmus - suspect peripheral cause will cont to monitor , has remote R cerebellar infarct on MRI but MRA not showing any sig VB disease  16.  Cognitive issues after Right MCA infarct , UA  and labs neg today, sleep has been disrupted, staff has noted mild agitation/ severe anxiety in the evening prior to starting seroquel     LOS: 17  days A FACE TO [place] 06/04/2022, 3:34 PM

## 2022-06-05 MED ORDER — CAPSAICIN 0.025 % EX CREA
TOPICAL_CREAM | Freq: Two times a day (BID) | CUTANEOUS | Status: DC
  Filled 2022-06-05: qty 60

## 2022-06-05 NOTE — Progress Notes (Signed)
PROGRESS NOTE   Subjective/Complaints:  RIght shoulder was doing better until it got cold last noc   ROS- neg CP, SOB, N/V/D. + Right upper extremity discomfort -ok today Objective:   No results found. No results for input(s): "WBC", "HGB", "HCT", "PLT" in the last 72 hours.  No results for input(s): "NA", "K", "CL", "CO2", "GLUCOSE", "BUN", "CREATININE", "CALCIUM" in the last 72 hours.   Intake/Output Summary (Last 24 hours) at 06/05/2022 0859 Last data filed at 06/05/2022 0747 Gross per 24 hour  Intake 716 ml  Output 1350 ml  Net -634 ml         Physical Exam: Vital Signs Blood pressure (!) 155/89, pulse 86, temperature 98 F (36.7 C), resp. rate 18, height 5' 11.5" (1.816 m), weight 93.6 kg, SpO2 92 %.   General: No acute distress Mood and affect are appropriate Heart: Regular rate and rhythm no rubs murmurs or extra sounds Lungs: Clear to auscultation, breathing unlabored, no rales or wheezes Abdomen: Positive bowel sounds, soft nontender to palpation, nondistended Extremities: No clubbing, cyanosis, or edema Skin: No evidence of breakdown, no evidence of rash  Psych: Pleasant mood, appropriate affect. Neuro: Fairly alert/attentive, oriented x 3  Eyes without evidence of nystagmus  Tone is normal without evidence of spasticity Cerebellar exam not tested due to weakness on L  3/16: Moving all 4 limbs antigravity in bed. 3- to 3+/5 Left delt , bi , tri, 2-/5 finger flexors and extensors  3- to 3/5 Left HF, KE, ADF --stable in appearance Sensory exam absent LT in LUE and LLE, senses pinch LUE   Musculoskeletal: Normal passive range of motion right upper extremity, mild tenderness and deformity apparent on palpation.   Assessment/Plan: 1. Functional deficits which require 3+ hours per day of interdisciplinary therapy in a comprehensive inpatient rehab setting. Physiatrist is providing close team  supervision and 24 hour management of active medical problems listed below. Physiatrist and rehab team continue to assess barriers to discharge/monitor patient progress toward functional and medical goals  Care Tool:  Bathing    Body parts bathed by patient: Left arm, Right arm, Chest, Abdomen, Right upper leg, Left upper leg, Front perineal area, Face   Body parts bathed by helper: Buttocks, Right lower leg, Left lower leg     Bathing assist Assist Level: Minimal Assistance - Patient > 75%     Upper Body Dressing/Undressing Upper body dressing   What is the patient wearing?: Pull over shirt    Upper body assist Assist Level: Moderate Assistance - Patient 50 - 74%    Lower Body Dressing/Undressing Lower body dressing      What is the patient wearing?: Underwear/pull up, Pants     Lower body assist Assist for lower body dressing: Maximal Assistance - Patient 25 - 49%     Toileting Toileting Toileting Activity did not occur (Clothing management and hygiene only): N/A (no void or bm)  Toileting assist Assist for toileting: 2 Helpers     Transfers Chair/bed transfer  Transfers assist     Chair/bed transfer assist level: Moderate Assistance - Patient 50 - 74%     Locomotion Ambulation   Ambulation assist  Assist level: 2 helpers Assistive device:  (hall rail) Max distance: 30   Walk 10 feet activity   Assist     Assist level: 2 helpers Assistive device:  (hall rail)   Walk 50 feet activity   Assist Walk 50 feet with 2 turns activity did not occur: Safety/medical concerns (Patient unable to ambulate >10' at this time secondary to increased fatigue with poor endurance/activity tolerance)         Walk 150 feet activity   Assist Walk 150 feet activity did not occur: Safety/medical concerns         Walk 10 feet on uneven surface  activity   Assist Walk 10 feet on uneven surfaces activity did not occur: Safety/medical concerns          Wheelchair     Assist Is the patient using a wheelchair?: Yes Type of Wheelchair: Manual    Wheelchair assist level: Contact Guard/Touching assist Max wheelchair distance: 150'    Wheelchair 50 feet with 2 turns activity    Assist        Assist Level: Contact Guard/Touching assist   Wheelchair 150 feet activity     Assist      Assist Level: Contact Guard/Touching assist   Blood pressure (!) 155/89, pulse 86, temperature 98 F (36.7 C), resp. rate 18, height 5' 11.5" (1.816 m), weight 93.6 kg, SpO2 92 %.  Medical Problem List and Plan: 1. Functional deficits secondary to right large MCA infarction due to right M2 occlusion status post IR/revascularization as well as history of CVA 2015 with left-sided residual weakness status post loop recorder             -patient may  shower             -ELOS/Goals: 3/28, PT min A, OT min to mod A, SLP supervision-Daughter was in over the weekend will need follow up in terms of whether daughter  wants to pursue SNF vs Home            -Continue CIR therapies including PT, OT, and SLP  Increasing tone Left hand finger flexors will order resting hand splint to be  worn at noc               -Continue work on visual scanning as he appears to have a visual field cut on the left 2.  Antithrombotics: -DVT/anticoagulation:  Pharmaceutical: Eliquis             -antiplatelet therapy: N/A 3. Pain Management: Tylenol as needed  RIght shoulder pain suspect rotator cuff strain vs aggravation of OA, Xray showing evidence of chronic RTC and Glenohumeral OA, discussed pros and cons of injection , pt poor surgical candidate for repair- given pain and poor candidate for stronger opioids. Pain and ROM improved after  Right glenohumeral joint injection with Marcaine and kenalog  - 3/18 , had pain last noc but no pain with ROM or exam this am , order Kpad at noc to R shoulder   4. Mood/Behavior/Sleep: Provide emotional support              -antipsychotic agents: N/A  - sleep chart 5. Neuropsych/cognition: This patient is capable of making decisions on his own behalf. 6. Skin/Wound Care: Routine skin checks 7. Fluids/Electrolytes/Nutrition: Routine in and outs with follow-up chemistries 8.  Dysphagia.  Diet advanced to Dysphagia 3 thin liquids.    9.  Hypertension.  Norvasc 10 mg daily, Toprol-XL 25 mg daily.  Monitor with increased  mobility.  Overall well-controlled.  Goal BP less than 180/105, long-term goal normotensive Vitals:   06/04/22 1925 06/05/22 0521  BP: 130/75 (!) 155/89  Pulse: 84 86  Resp: 17 18  Temp: 99 F (37.2 C) 98 F (36.7 C)  SpO2: 95% 92%  Controlled 3/15  10.  Atrial fibrillation.  Cardiac rate controlled.  Continue Toprol-xl 25 mg daily On Eliquis  11.  Hyperlipidemia.  Lipitor 20 mg daily 12.  Parotid mass, follow-up outpatient 13.  CKD 3a.    creat improved vs prior       Latest Ref Rng & Units 05/30/2022    6:06 AM 05/19/2022    8:48 AM 05/18/2022    4:54 AM  BMP  Glucose 70 - 99 mg/dL 93  182  127   BUN 8 - 23 mg/dL 22  23  18    Creatinine 0.61 - 1.24 mg/dL 1.26  1.14  0.98   Sodium 135 - 145 mmol/L 139  134  137   Potassium 3.5 - 5.1 mmol/L 3.8  3.7  3.8   Chloride 98 - 111 mmol/L 104  99  104   CO2 22 - 32 mmol/L 25  25  22    Calcium 8.9 - 10.3 mg/dL 8.9  9.2  9.1    14.  Insomnia- Mirtazepine and seroquel - daughter concerned about meds causing MS changes, suspect it is mainly CVA - pt feels like he slept better is more alert today , check sleep graph to document labs look normal     15.  Brief episode of nystagmus - suspect peripheral cause will cont to monitor , has remote R cerebellar infarct on MRI but MRA not showing any sig VB disease  16.  Cognitive issues after Right MCA infarct , UA  and labs neg today, sleep has been disrupted, staff has noted mild agitation/ severe anxiety in the evening prior to starting seroquel     LOS: 18 days A FACE TO [place] E Fatema Rabe 06/05/2022, 8:59 AM

## 2022-06-05 NOTE — Progress Notes (Addendum)
Patient ID: Edward Maynard, male   DOB: 1933/09/03, 89 y.o.   MRN: [id number]  Authorization requested through [facility] at [phone] Clinical information sent to [phone] Reference number: [id number]  3/19 12:03 PM: Sw reached FU from daughter, Edward Maynard. Family has not determined a facility but are looking at [facility] and [name] for a potential transfer. SW will reach out to both facilities today. No additional questions or concerns.

## 2022-06-05 NOTE — Progress Notes (Signed)
Physical Therapy Session Note  Patient Details  Name: Edward Maynard MRN: [id number] Date of Birth: 07-Apr-1933  Today's Date: 06/05/2022 PT Individual Time: 1300-1415 PT Individual Time Calculation (min): 75 min   Short Term Goals: Week 2:  PT Short Term Goal 1 (Week 2): Pt will perform bed mobility with min assist PT Short Term Goal 2 (Week 2): Pt will perform bed<>chair transfers using LRAD with min assist PT Short Term Goal 3 (Week 2): Pt will perform sit<>stand transfers min assist using LRAD PT Short Term Goal 4 (Week 2): Pt will ambulate at least 61ft using LRAD with mod assist of 1 and +2 for safety  Skilled Therapeutic Interventions/Progress Updates:      Pt sitting in w/c to start with his L arm hanging off w/c and patient unaware of positioning - even with cues, lacks ability to self correct without external assist.   Transported in w/c to main rehab gym for time management. Squat<>pivot transfer towards mat table from w/c with modA overall due to motor planning deficits and L sided weakness. L hemibody kicks into extension (UE and LE) and benefits from blocking LE to keep adequate BOS during transfer.  Worked on blocked practice sit<>stands and static standing balance with min/modA - similar assist with blocking LLE from extending. Continues to demonstrate R gaze preference that impacts standing balance and awareness.   Focused remainder of session on NMR for standing balance and gait training using harness body weight support (Maxi Sky) in main rehab gym. Harness donned/doffed in sitting for safety. Used partial BWS for activities. Required +2 assist for safely ambulating 82ft distances several times (at least x8) and also needed leg lifter on LLE to help facilitate adequate step length and position due to motor impairments and sensory/proprioception deficits.   Returned to his room and assisted to bed with min/modA stand<>pivot transfer using hospital bed rail for UE support.  Assist for repositioning and scooting towards HOB as well. Pillow provided to support LUE and call bell in lap on his R side. All needs met.   Therapy Documentation Precautions:  Precautions Precautions: Fall Precaution Comments: Left hemi; Left homonymous hemianopsia Restrictions Weight Bearing Restrictions: No General:      Therapy/Group: Individual Therapy  Jone Baseman Armondo Cech PT ,DPT 06/05/2022, 7:54 AM

## 2022-06-05 NOTE — Progress Notes (Signed)
Speech Language Pathology Daily Session Note  Patient Details  Name: Edward Maynard MRN: [id number] Date of Birth: [birth year]  Today's Date: 06/05/2022 SLP Individual Time: [id number] SLP Individual Time Calculation (min): 60 min  Short Term Goals: Week 3: SLP Short Term Goal 1 (Week 3): Pt will trial regular textures with functional oral phase and no overt s/sx concering for airway intrusion with Mod I for implementation of safe swallowing strategies. SLP Short Term Goal 2 (Week 3): Patient will scan left of midline during functional tasks with mod A verbal cues to achieve 70% accuracy SLP Short Term Goal 3 (Week 3): Pt will complete basic, functional problem-solving tasks with 70% accuracy given Min-Mod A verbal and/or visual cues.  Skilled Therapeutic Interventions: Pt was seen in am to address cognitive re- training. Pt was alert and seated upright in WC upon SLP arrival. Pt reported requested water and consumed thin liquid via straw sips without impairment. Plan for session this date was to address scanning of left midline and basic problem solving. SLP first addressing scanning of left side via structured tasks with pt completing initially with mod A cues for lighthouse technique decreasing to mod I for identifying information on left side. Given pt success, tasks switched to a more functional activity (I.e. reading therapy schedule and looking at a hypothetical bill). Mod A to max A cues for scanning to left side using lighthouse technique. SLP utilized anchor technique in addition to the lighthouse technique to facilitate success with mild improvement. In additional minutes of session, SLP addressed problem solving within current environment. Given scenarios presented verbally, pt identified solutions with 80% acc mod I. In missed opportunities, SLP provided correct response and rationale. Pt was left seated upright in WC with call button within reach and chair alarm active. SLP to continue  POC.  Pain No pain.  Therapy/Group: Individual Therapy  Colin Benton 06/05/2022, 3:59 PM

## 2022-06-05 NOTE — Progress Notes (Addendum)
Sw informed that with pt's current [facility] plan pt currently only has benefits to admit to the facilitys below:  [facility], [place], [place], [place], [place], [place], [place], [place], [place] will inform family. Call [phone] with SNF determination.  Patient meets criteria SNF.   3/18 12:54 PM: Sw informed daughter of the preferred facilities based on insurance. SW will wait for daughter to review list to determine if she would like to move forward with facilities listed. No additional questions or concerns.   3/18 3:33 PM SW received notification from daughter to follow up on pt's referral at [facility]. Sw will reach out to AD to determine facility decision. No additional questions or concerns.   3:34 PM: Sw left facility a VM for FU.

## 2022-06-05 NOTE — Progress Notes (Signed)
Occupational Therapy Weekly Progress Note  Patient Details  Name: Edward Maynard MRN: [id number] Date of Birth: 02-10-1934  Beginning of progress report period: May 27, 2022 End of progress report period: June 05, 2022  Today's Date: 06/05/2022 OT Individual Time: 0805-0902 OT Individual Time Calculation (min): 57 min    Patient has met 0 of 2 short term goals, however Pt is presenting with increased participation in BADLs and functional tasks. Pt is progressing well towards meeting his goals and is demonstrating increased participation in completing BADL tasks. Pt demonstrated increased awareness to L external visual field, however requires cueing for attention. Pt continues to be limited by L inattention, motor planning challenges, and lack of sensation/proprioceptive input from L hemi body. Pt is participating in UB dressing completing at mod-max A level, UB dressing completing at mod-max A level, and grooming/hygiene tasks completing at mod A level. Pt performing toilet and TTB transfers at mod A level with L knee blocking d/t ataxia. Pt requires max A for toileting and benefit from using A stedy for balance. Pt is attempting to utilize his LUE in functional tasks when provided VB cueing. Pt also has R shoulder injury impacting functional performance d/t pain and decreased ROM, however pain has been decreasing over previous day.   Patient continues to demonstrate the following deficits: muscle weakness, decreased cardiorespiratoy endurance, impaired timing and sequencing, abnormal tone, unbalanced muscle activation, motor apraxia, ataxia, decreased coordination, and decreased motor planning, decreased visual perceptual skills, decreased visual motor skills, and field cut, decreased midline orientation, decreased attention to left, and decreased motor planning, decreased initiation, decreased attention, decreased awareness, decreased problem solving, decreased safety awareness, decreased memory,  and delayed processing, and decreased sitting balance, decreased standing balance, decreased postural control, hemiplegia, and decreased balance strategies and therefore will continue to benefit from skilled OT intervention to enhance overall performance with BADL and Reduce care partner burden.  Patient progressing toward long term goals..  {plan of [id number]  OT Short Term Goals Week 1:  OT Short Term Goal 1 (Week 1): Pt will maintain dyanmic sitting balance with CGA during BADLs/functional activities OT Short Term Goal 1 - Progress (Week 1): Met OT Short Term Goal 2 (Week 1): Pt will located 5/5 items on L side of sink/tray/table with min cueing OT Short Term Goal 2 - Progress (Week 1): Met OT Short Term Goal 3 (Week 1): Pt will complete UB dressing min A OT Short Term Goal 3 - Progress (Week 1): Progressing toward goal OT Short Term Goal 4 (Week 1): Pt will self-feed with min A OT Short Term Goal 4 - Progress (Week 1): Progressing toward goal Week 2:  OT Short Term Goal 1 (Week 2): Pt will complete UB bathing min A OT Short Term Goal 1 - Progress (Week 2): Progressing toward goal OT Short Term Goal 2 (Week 2): Pt will attend to L visual field/environment/body during BADLs/functional tasks with min cueing OT Short Term Goal 2 - Progress (Week 2): Progressing toward goal Week 3:  OT Short Term Goal 1 (Week 3): STG=LTG d/t Pt ELOS  Skilled Therapeutic Interventions/Progress Updates:     Pt received sleeping in bed easy to wake upon OT arrival. Pt presenting to be fatigued, however in good spirits and receptive to skilled OT session. Pt reporting pain in R shoulder un-rated, however Pt reported decreased pain today vs. previous days with OT offering rest breaks, repositioning, and gentle exercise.   TEDs, socks, and ankle brace donned on Pt with  Pt in supine. OT engaging Pt in light hearted conversation, however Pt falling asleep during task d/t fatigue. Increased light in room by opening  blinds and provided Pt warm wash cloth to wash face to increase alertness for OT session.    Pt transitioned to EOB with mod A using bed features with maximal cueing for technique. Increased time and cues required this AM d/t Pt fatigue. Sitting EOB, Pt able to maintain static sitting balance with supervision with BLEs supported on ground. RN in/out to provided morning medications. Discussed need for Pt to wear resting hand splint at night with RN reporting she will communicate need to night shift RN.   Facilitated dynamic sitting balance challenge completing LB dressing EOB with emphasis on anterior weight shifting to don pants and utilizing LUE during task. Max questioning cues required for Pt to recall hemi-dressing technique. Pt participating well attempting to use LUE to grasp pants with LUE, however requiring HOH A to maintain grasp on pants waist and support controlled motor movement d/t ataxia. Max A required to weave feet into pants with mad A required for sitting balance. Pt completed sit>stand using RUE on bed rail and OT providing HHA and block knee on L side light mod A. Maintained standing balance mod with OT blocking knee bringing pants to waist max A.   Pt completed RUE AAROM exercises to increase functional use and decrease pain for BADLs and functional activities. Pt able to bring RUE to 90 degrees shoulder flexion, ~80 degrees abduction, and partially internally/externally rotate in pain free range with decrease pain noted today compared to previous sessions.   Squat pivot EOB>wc mod A with max cueing provided for technique and safety. Pt urgently requesting to use restroom following transfers. Utilized stedy for transport to bathroom to increase Pt safety d/t not having DABSC available. Pt able to complete sit>stand using grab bar with min A OT facilitating LLE positioning. Pt provided increased time on toilet d/t need for BM (continent BM and void in toilet charted in flowsheet). Pt  completed sit>stands x2 min A maintaining standing balance with BUE on grab bar min A for OT to perform posterior pericare and clothing management. Pt washed hands in sink with mod A.   Pt tearful at end of session discussing psychosocial factors and lifestyle changes d/t his CVA with OT providing therapeutic support and encouragement. Pt also continuing to talk down about himself reporting "I am just not doing as well as I should be" with OT providing therapeutic support with noted improvement. MD in/out at beginning of session. Pt was left resting in wc with call bell in reach, seat belt alarm on, and all needs met.   Therapy Documentation Precautions:  Precautions Precautions: Fall Precaution Comments: Left hemi; Left homonymous hemianopsia Restrictions Weight Bearing Restrictions: No General:   Vital Signs: Therapy Vitals Temp: 98 F (36.7 C) Pulse Rate: 86 Resp: 18 BP: (Abnormal) 155/89 Patient Position (if appropriate): Lying Oxygen Therapy SpO2: 92 % O2 Device: Room Air Pain:   ADL: ADL Eating: Moderate assistance Where Assessed-Eating: Bed level Grooming: Moderate assistance Where Assessed-Grooming: Edge of bed Upper Body Bathing: Moderate assistance, Maximal assistance Where Assessed-Upper Body Bathing: Edge of bed Lower Body Bathing: Maximal assistance Where Assessed-Lower Body Bathing: Edge of bed Upper Body Dressing: Moderate assistance, Maximal assistance Where Assessed-Upper Body Dressing: Edge of bed Lower Body Dressing: Maximal assistance Where Assessed-Lower Body Dressing: Edge of bed Toileting: Dependent, Maximal assistance Where Assessed-Toileting: Bed level Toilet Transfer: Dependent, Maximal assistance (stedy,  able to pull self to standing; not safety to atempt squat pivot or ambulate to bathroom) Toilet Transfer Equipment: Bedside commode Tub/Shower Transfer: Not assessed Social research officer, government: Dependent, Maximal assistance (stedy) Financial planner Method: Other (comment) (stedy) Walk-In Shower Equipment: Radio broadcast assistant   Therapy/Group: Individual Therapy  Janey Genta 06/05/2022, 7:59 AM

## 2022-06-05 NOTE — Progress Notes (Signed)
Patient ID: Edward Maynard, male   DOB: 07-Feb-1934, 89 y.o.   MRN: [id number]  SW informed by PT that daughter and SIL would like to move forward with SNF. Sw made attempt to call daughter to confirm and left detail VM. Sw will begin attempt for authorization today.

## 2022-06-06 NOTE — Plan of Care (Signed)
  Problem: Sit to Stand Goal: LTG:  Patient will perform sit to stand in prep for activites of daily living with assistance level (OT) Description: LTG:  Patient will perform sit to stand in prep for activites of daily living with assistance level (OT) Flowsheets (Taken 06/06/2022 0752) LTG: PT will perform sit to stand in prep for activites of daily living with assistance level: Minimal Assistance - Patient > 75% Note: Goal downgrade d/t Pt current progress and ELOS    Problem: RH Eating Goal: LTG Patient will perform eating w/assist, cues/equip (OT) Description: LTG: Patient will perform eating with assist, with/without cues using equipment (OT) Flowsheets (Taken 06/06/2022 0756) LTG: Pt will perform eating with assistance level of: Minimal Assistance - Patient > 75% Note: Goal downgrade d/t Pt current progress and ELOS    Problem: RH Grooming Goal: LTG Patient will perform grooming w/assist,cues/equip (OT) Description: LTG: Patient will perform grooming with assist, with/without cues using equipment (OT) Flowsheets (Taken 06/06/2022 0756) LTG: Pt will perform grooming with assistance level of: Minimal Assistance - Patient > 75% Note: Goal downgrade d/t Pt current progress and ELOS    Problem: RH Bathing Goal: LTG Patient will bathe all body parts with assist levels (OT) Description: LTG: Patient will bathe all body parts with assist levels (OT) Flowsheets (Taken 06/06/2022 0756) LTG: Pt will perform bathing with assistance level/cueing: Moderate Assistance - Patient 50 - 74% Note: Goal downgrade d/t Pt current progress and ELOS    Problem: RH Dressing Goal: LTG Patient will perform lower body dressing w/assist (OT) Description: LTG: Patient will perform lower body dressing with assist, with/without cues in positioning using equipment (OT) Flowsheets (Taken 06/06/2022 0756) LTG: Pt will perform lower body dressing with assistance level of: Moderate Assistance - Patient 50 - 74% Note:  Goal downgrade d/t Pt current progress and ELOS    Problem: RH Toileting Goal: LTG Patient will perform toileting task (3/3 steps) with assistance level (OT) Description: LTG: Patient will perform toileting task (3/3 steps) with assistance level (OT)  Flowsheets (Taken 06/06/2022 0756) LTG: Pt will perform toileting task (3/3 steps) with assistance level: Moderate Assistance - Patient 50 - 74% Note: Goal downgrade d/t Pt current progress and ELOS    Problem: RH Toilet Transfers Goal: LTG Patient will perform toilet transfers w/assist (OT) Description: LTG: Patient will perform toilet transfers with assist, with/without cues using equipment (OT) Flowsheets (Taken 06/06/2022 0756) LTG: Pt will perform toilet transfers with assistance level of: Moderate Assistance - Patient 50 - 74% Note: Goal downgrade d/t Pt current progress and ELOS    Problem: RH Tub/Shower Transfers Goal: LTG Patient will perform tub/shower transfers w/assist (OT) Description: LTG: Patient will perform tub/shower transfers with assist, with/without cues using equipment (OT) Flowsheets (Taken 06/06/2022 0756) LTG: Pt will perform tub/shower stall transfers with assistance level of: Moderate Assistance - Patient 50 - 74% Note: Goal downgrade d/t Pt current progress and ELOS

## 2022-06-06 NOTE — Progress Notes (Signed)
Physical Therapy Session Note  Patient Details  Name: Edward Maynard MRN: [id number] Date of Birth: 1934-03-18  Today's Date: 06/06/2022 PT Individual Time: 0305-0403 PT Individual Time Calculation (min): 58 min   Short Term Goals: Week 2:  PT Short Term Goal 1 (Week 2): Pt will perform bed mobility with min assist PT Short Term Goal 1 - Progress (Week 2): Not met PT Short Term Goal 2 (Week 2): Pt will perform bed<>chair transfers using LRAD with min assist PT Short Term Goal 2 - Progress (Week 2): Progressing toward goal PT Short Term Goal 3 (Week 2): Pt will perform sit<>stand transfers min assist using LRAD PT Short Term Goal 3 - Progress (Week 2): Progressing toward goal PT Short Term Goal 4 (Week 2): Pt will ambulate at least 84ft using LRAD with mod assist of 1 and +2 for safety PT Short Term Goal 4 - Progress (Week 2): Progressing toward goal Week 3:  PT Short Term Goal 1 (Week 3): STG = LTG due to ELOS Week 4:     Skilled Therapeutic Interventions/Progress Updates:      Therapy Documentation Precautions:  Precautions Precautions: Fall Precaution Comments: Left hemi; Left homonymous hemianopsia Restrictions Weight Bearing Restrictions: No General:   Vital Signs:   Pain: Pain Assessment Pain Scale: 0-10 Pain Score: 0-No pain Mobility:   Locomotion :    Trunk/Postural Assessment :    Balance:   Exercises:   Other Treatments:      Therapy/Group: Individual Therapy  Alger Simons PT, DPT, CSRS 06/06/2022, 5:58 PM

## 2022-06-06 NOTE — Progress Notes (Signed)
Speech Language Pathology Daily Session Note  Patient Details  Name: Edward Maynard MRN: [id number] Date of Birth: 07/05/1933  Today's Date: 06/06/2022 SLP Individual Time: 0800-0900 SLP Individual Time Calculation (min): 60 min  Short Term Goals: Week 3: SLP Short Term Goal 1 (Week 3): Pt will trial regular textures with functional oral phase and no overt s/sx concering for airway intrusion with Mod I for implementation of safe swallowing strategies. SLP Short Term Goal 2 (Week 3): Patient will scan left of midline during functional tasks with mod A verbal cues to achieve 70% accuracy SLP Short Term Goal 3 (Week 3): Pt will complete basic, functional problem-solving tasks with 70% accuracy given Min-Mod A verbal and/or visual cues.  Skilled Therapeutic Interventions:  Pt was seen in am to address cognitive re- training. Pt was alert and seen at bedside. He denied pain but confirmed feelings of depression. Pt was responsive to active listening and encouragement from this SLP. Despite emotion, he was agreeable for session. SLP continued to address inattention to left side this date during functional tasks and basic problem solving. Utilizing external visual aids, pt challenged to identify information (date, therapy schedule, and items within room). Pt required mod to max A to identify information on L side including decreasing visual field/ complexity. Pt reported confusion and requested frequent review of information (I.e. how to use his calendar). Additionally, pt observed with frustration requiring intermittent breaks. Pt stated, "I just don't know why I can't do this." Breaks were mostly successful and pt able to integrate back to task. Pt with improved awareness to left side when asked to locate items within his room, requiring only min A. SLP also challenged pt in problem solving of basic information. Given scenarios presented verbally, pt completed task with 75% acc. In missed opportunities, SLP  provided correct response and rationale. SLP to continue POC.   Pain No Pain.   Therapy/Group: Individual Therapy  Colin Benton 06/06/2022, 8:55 AM

## 2022-06-06 NOTE — Progress Notes (Signed)
Nutrition Follow-up  DOCUMENTATION CODES:   Not applicable  INTERVENTION:  - Continue Ensure Enlive po TID, each supplement provides 350 kcal and 20 grams of protein.   NUTRITION DIAGNOSIS:   No nutrition diagnosis.    REASON FOR ASSESSMENT:   Malnutrition Screening Tool    ASSESSMENT:   89 y.o. male admits to CIR related to functional deficits in setting of right middle cerebral artery stroke. PMH includes: high cholesterol and HLD.  Meds reviewed: lipitor, colace, remeron. Labs reviewed.   Pt has been eating well since last assessment. PO intakes 50-100% over the past 7 days. Pt is meeting his needs at this time. RD will continue to monitor.   Diet Order:   Diet Order             DIET DYS 3 Room service appropriate? Yes; Fluid consistency: Thin  Diet effective now                   EDUCATION NEEDS:      Skin:  Skin Assessment: Reviewed RN Assessment  Last BM:  3/18 - type 6  Height:   Ht Readings from Last 1 Encounters:  05/18/22 5' 11.5" (1.816 m)    Weight:   Wt Readings from Last 1 Encounters:  06/01/22 93.6 kg    Ideal Body Weight:     BMI:  Body mass index is 28.38 kg/m.  Estimated Nutritional Needs:   Kcal:  [id number] kcals  Protein:  115-140 gm  Fluid:  >/= 2.3 L  Thalia Bloodgood, RD, LDN, CNSC.

## 2022-06-06 NOTE — Progress Notes (Addendum)
Patient ID: Edward Maynard, male   DOB: [birth year], 89 y.o.   MRN: [id number]  Sw made attempt to call patient's daughter to follow up on SNF preference. Left detailed VM. SW will wait for FU.  11:33AM: SW has received preference from family:  [facility] (No response, waiting on FU) [facility]  1:22 PM: *Sw updated authorization with facility preference of [facility]. No additional questions or concerns.

## 2022-06-06 NOTE — Progress Notes (Signed)
PROGRESS NOTE   Subjective/Complaints: Appreciate SW note   ROS- neg CP, SOB, N/V/D. + Right upper extremity discomfort -ok today Objective:   No results found. No results for input(s): "WBC", "HGB", "HCT", "PLT" in the last 72 hours.  No results for input(s): "NA", "K", "CL", "CO2", "GLUCOSE", "BUN", "CREATININE", "CALCIUM" in the last 72 hours.   Intake/Output Summary (Last 24 hours) at 06/06/2022 0803 Last data filed at 06/06/2022 0743 Gross per 24 hour  Intake 472 ml  Output 475 ml  Net -3 ml         Physical Exam: Vital Signs Blood pressure 118/74, pulse 80, temperature 97.6 F (36.4 C), resp. rate 15, height 5' 11.5" (1.816 m), weight 93.6 kg, SpO2 91 %.   General: No acute distress Mood and affect are appropriate Heart: Regular rate and rhythm no rubs murmurs or extra sounds Lungs: Clear to auscultation, breathing unlabored, no rales or wheezes Abdomen: Positive bowel sounds, soft nontender to palpation, nondistended Extremities: No clubbing, cyanosis, or edema Skin: No evidence of breakdown, no evidence of rash  Psych: Pleasant mood, appropriate affect. Neuro: Fairly alert/attentive, oriented x 3  Eyes without evidence of nystagmus  Tone is normal without evidence of spasticity Cerebellar exam not tested due to weakness on L  3/16: Moving all 4 limbs antigravity in bed. 3- to 3+/5 Left delt , bi , tri, 2-/5 finger flexors and extensors  3- to 3/5 Left HF, KE, ADF --stable in appearance Sensory exam absent LT in LUE and LLE, senses pinch LUE   Musculoskeletal: Normal passive range of motion right upper extremity, mild tenderness and deformity apparent on palpation.unable to elevate Right arm overhead without assist    Assessment/Plan: 1. Functional deficits which require 3+ hours per day of interdisciplinary therapy in a comprehensive inpatient rehab setting. Physiatrist is providing close team  supervision and 24 hour management of active medical problems listed below. Physiatrist and rehab team continue to assess barriers to discharge/monitor patient progress toward functional and medical goals  Care Tool:  Bathing    Body parts bathed by patient: Left arm, Right arm, Chest, Abdomen, Right upper leg, Left upper leg, Front perineal area, Face   Body parts bathed by helper: Buttocks, Right lower leg, Left lower leg     Bathing assist Assist Level: Minimal Assistance - Patient > 75%     Upper Body Dressing/Undressing Upper body dressing   What is the patient wearing?: Pull over shirt    Upper body assist Assist Level: Moderate Assistance - Patient 50 - 74%    Lower Body Dressing/Undressing Lower body dressing      What is the patient wearing?: Underwear/pull up, Pants     Lower body assist Assist for lower body dressing: Maximal Assistance - Patient 25 - 49%     Toileting Toileting Toileting Activity did not occur (Clothing management and hygiene only): N/A (no void or bm)  Toileting assist Assist for toileting: 2 Helpers     Transfers Chair/bed transfer  Transfers assist     Chair/bed transfer assist level: Moderate Assistance - Patient 50 - 74%     Locomotion Ambulation   Ambulation assist  Assist level: 2 helpers Assistive device:  (hall rail) Max distance: 30   Walk 10 feet activity   Assist     Assist level: 2 helpers Assistive device:  (hall rail)   Walk 50 feet activity   Assist Walk 50 feet with 2 turns activity did not occur: Safety/medical concerns (Patient unable to ambulate >10' at this time secondary to increased fatigue with poor endurance/activity tolerance)         Walk 150 feet activity   Assist Walk 150 feet activity did not occur: Safety/medical concerns         Walk 10 feet on uneven surface  activity   Assist Walk 10 feet on uneven surfaces activity did not occur: Safety/medical concerns          Wheelchair     Assist Is the patient using a wheelchair?: Yes Type of Wheelchair: Manual    Wheelchair assist level: Contact Guard/Touching assist Max wheelchair distance: 150'    Wheelchair 50 feet with 2 turns activity    Assist        Assist Level: Contact Guard/Touching assist   Wheelchair 150 feet activity     Assist      Assist Level: Contact Guard/Touching assist   Blood pressure 118/74, pulse 80, temperature 97.6 F (36.4 C), resp. rate 15, height 5' 11.5" (1.816 m), weight 93.6 kg, SpO2 91 %.  Medical Problem List and Plan: 1. Functional deficits secondary to right large MCA infarction due to right M2 occlusion status post IR/revascularization as well as history of CVA 2015 with left-sided residual weakness status post loop recorder             -patient may  shower             -ELOS/Goals: 3/28, PT min A, OT min to mod A, SLP supervision-SNF pending , family preference is [facility] which is reportedly covered by health insurance               -Continue work on visual scanning as he appears to have a visual field cut on the left 2.  Antithrombotics: -DVT/anticoagulation:  Pharmaceutical: Eliquis             -antiplatelet therapy: N/A 3. Pain Management: Tylenol as needed  RIght shoulder pain suspect rotator cuff strain vs aggravation of OA, Xray showing evidence of chronic RTC and Glenohumeral OA, discussed pros and cons of injection , pt poor surgical candidate for repair- given pain and poor candidate for stronger opioids. Pain and ROM improved after  Right glenohumeral joint injection with Marcaine and kenalog  - 3/18 , had pain last noc but no pain with ROM or exam this am , order Kpad at noc to R shoulder  3/19 pt does not remember using Kpad last noc   4. Mood/Behavior/Sleep: Provide emotional support             -antipsychotic agents: N/A  - sleep chart 5. Neuropsych/cognition: This patient is capable of making decisions on his own  behalf. 6. Skin/Wound Care: Routine skin checks 7. Fluids/Electrolytes/Nutrition: Routine in and outs with follow-up chemistries 8.  Dysphagia.  Diet advanced to Dysphagia 3 thin liquids.    9.  Hypertension.  Norvasc 10 mg daily, Toprol-XL 25 mg daily.  Monitor with increased mobility.  Overall well-controlled.  Goal BP less than 180/105, long-term goal normotensive Vitals:   06/05/22 1938 06/06/22 0555  BP: 100/64 118/74  Pulse: 81 80  Resp: 15 15  Temp: 98.9 F (  37.2 C) 97.6 F (36.4 C)  SpO2: 91% 91%  Controlled 3/19  10.  Atrial fibrillation.  Cardiac rate controlled.  Continue Toprol-xl 25 mg daily On Eliquis  11.  Hyperlipidemia.  Lipitor 20 mg daily 12.  Parotid mass, follow-up outpatient 13.  CKD 3a.    creat improved vs prior       Latest Ref Rng & Units 05/30/2022    6:06 AM 05/19/2022    8:48 AM 05/18/2022    4:54 AM  BMP  Glucose 70 - 99 mg/dL 93  182  127   BUN 8 - 23 mg/dL 22  23  18    Creatinine 0.61 - 1.24 mg/dL 1.26  1.14  0.98   Sodium 135 - 145 mmol/L 139  134  137   Potassium 3.5 - 5.1 mmol/L 3.8  3.7  3.8   Chloride 98 - 111 mmol/L 104  99  104   CO2 22 - 32 mmol/L 25  25  22    Calcium 8.9 - 10.3 mg/dL 8.9  9.2  9.1    14.  Insomnia- Mirtazepine and seroquel - daughter concerned about meds causing MS changes, suspect it is mainly CVA - pt feels like he slept better is more alert today , check sleep graph to document labs look normal     15.  Brief episode of nystagmus - suspect peripheral cause will cont to monitor , has remote R cerebellar infarct on MRI but MRA not showing any sig VB disease  16.  Cognitive issues after Right MCA infarct , UA  and labs neg today, sleep has been disrupted, staff has noted mild agitation/ severe anxiety in the evening prior to starting seroquel     LOS: 19 days A FACE TO [place] E Ann Bohne 06/06/2022, 8:03 AM

## 2022-06-06 NOTE — Progress Notes (Incomplete)
Physical Therapy Weekly Progress Note  Patient Details  Name: Edward Maynard MRN: [id number] Date of Birth: 02/11/1934  Beginning of progress report period: May 30, 2022 End of progress report period: June 06, 2022  {CHL IP REHAB PT TIME CALCULATION:[id number]}  Patient has met 0 of 4 short term goals.  Edward Maynard is making slower than anticipated progress towards LTG of CGA to minA. *** Family training completed however family feels that they are unable to provide adequate assist to manage patient at home - plan now is for short term SNF to continue CVA rehabilitation.   Patient continues to demonstrate the following deficits muscle weakness and muscle joint tightness, decreased cardiorespiratoy endurance, impaired timing and sequencing, abnormal tone, unbalanced muscle activation, motor apraxia, decreased coordination, and decreased motor planning, decreased visual perceptual skills, field cut, and hemianopsia, decreased attention to left, decreased motor planning, and ideational apraxia, decreased initiation, decreased attention, decreased awareness, decreased problem solving, decreased safety awareness, decreased memory, and delayed processing, and decreased sitting balance, decreased standing balance, decreased postural control, hemiplegia, and decreased balance strategies and therefore will continue to benefit from skilled PT intervention to increase functional independence with mobility.  Patient progressing toward long term goals.Marland Kitchen  LTG downgraded to modA overall and DC stair goal - see POC note for details.   PT Short Term Goals Week 2:  PT Short Term Goal 1 (Week 2): Pt will perform bed mobility with min assist PT Short Term Goal 1 - Progress (Week 2): Not met PT Short Term Goal 2 (Week 2): Pt will perform bed<>chair transfers using LRAD with min assist PT Short Term Goal 2 - Progress (Week 2): Progressing toward goal PT Short Term Goal 3 (Week 2): Pt will perform sit<>stand transfers  min assist using LRAD PT Short Term Goal 3 - Progress (Week 2): Progressing toward goal PT Short Term Goal 4 (Week 2): Pt will ambulate at least 24ft using LRAD with mod assist of 1 and +2 for safety PT Short Term Goal 4 - Progress (Week 2): Progressing toward goal Week 3:  PT Short Term Goal 1 (Week 3): STG = LTG due to ELOS  Skilled Therapeutic Interventions/Progress Updates:       ***   Therapy Documentation Precautions:  Precautions Precautions: Fall Precaution Comments: Left hemi; Left homonymous hemianopsia Restrictions Weight Bearing Restrictions: No General:   Vital Signs: Therapy Vitals Temp: 97.6 F (36.4 C) Pulse Rate: 80 Resp: 15 BP: 118/74 Patient Position (if appropriate): Lying Oxygen Therapy SpO2: 91 % O2 Device: Room Air Pain:   Vision/Perception     Mobility:   Locomotion :    Trunk/Postural Assessment :    Balance:   Exercises:   Other Treatments:     Therapy/Group: Individual Therapy  Edward Maynard PT, DPT 06/06/2022, 7:44 AM

## 2022-06-06 NOTE — Progress Notes (Signed)
Physical Therapy Session Note  Patient Details  Name: Edward Maynard MRN: [id number] Date of Birth: Jan 25, 1934  Today's Date: 06/06/2022 PT Individual Time: 0930-1000 PT Individual Time Calculation (min): 30 min   Short Term Goals: Week 3:  PT Short Term Goal 1 (Week 3): STG = LTG due to ELOS  Skilled Therapeutic Interventions/Progress Updates:  Patient greeted supine in bed without reports of pain and agreeable to PT treatment session. While supine in bed patient rolled L with supv in order for therapist to don clean brief. Therapist then threaded pants for time management and patient attempted to bridge in order for therapist to pull pants over hips, however even with therapist stabilizing L LE was unable to sustain a bridge- Patient rolled L as therapist pulled pants over hips. Therapist also donned socks and shoes with total assist for time management. Patient transitioned from supine to sitting EOB with ModA for righting trunk with multimodal cues throughout for proper sequencing. Patient was able to sit EOB with supv for safety- While sitting, patient was able to doff shirt with VC and then don clean shirt with MinA for threading L UE. Patient stood from EOB with Min/ModA and then performed stand pivot transfer to wheelchair with ModA and multimodal cues for proper sequencing. Patient wheeled to rehab gym for time management and energy conservation.   Patient stood at elevated table with a mirror in front of him for external visual cues regarding posturing- While standing patient was tasked with improved L LE TKE with therapist provided multimodal cues, as well as scanning toward his L in the mirror in order to locate therapist's hand and notify how many finger's therapist was holding up- Patient was 50% accurate with identifying number of fingers. Patient tolerated standing x2 trials with MinA to initiated standing and Min/ModA once standing upright with multimodal cues for improved postural  extension.   Patient returned to his room via rehab tech with call bell within reach and all needs met.    Therapy Documentation Precautions:  Precautions Precautions: Fall Precaution Comments: Left hemi; Left homonymous hemianopsia Restrictions Weight Bearing Restrictions: No   Therapy/Group: Individual Therapy  Shammara Jarrett 06/06/2022, 7:53 AM

## 2022-06-06 NOTE — Plan of Care (Signed)
  Problem: RH Stairs Goal: LTG Patient will ambulate up and down stairs w/assist (PT) Description: LTG: Patient will ambulate up and down # of stairs with assistance (PT) Outcome: Not Applicable - DC stair goal due to safety concerns Problem: Sit to Stand Goal: LTG:  Patient will perform sit to stand with assistance level (PT) Description: LTG:  Patient will perform sit to stand with assistance level (PT) Flowsheets (Taken 06/06/2022 0747) LTG: PT will perform sit to stand in preparation for functional mobility with assistance level: Minimal Assistance - Patient > 75%   Problem: RH Bed Mobility Goal: LTG Patient will perform bed mobility with assist (PT) Description: LTG: Patient will perform bed mobility with assistance, with/without cues (PT). Flowsheets (Taken 06/06/2022 0747) LTG: Pt will perform bed mobility with assistance level of: Moderate Assistance - Patient 50 - 74%   Problem: RH Bed to Chair Transfers Goal: LTG Patient will perform bed/chair transfers w/assist (PT) Description: LTG: Patient will perform bed to chair transfers with assistance (PT). Flowsheets (Taken 06/06/2022 0747) LTG: Pt will perform Bed to Chair Transfers with assistance level: Moderate Assistance - Patient 50 - 74%   Problem: RH Car Transfers Goal: LTG Patient will perform car transfers with assist (PT) Description: LTG: Patient will perform car transfers with assistance (PT). Flowsheets (Taken 06/06/2022 0747) LTG: Pt will perform car transfers with assist:: Moderate Assistance - Patient 50 - 74%   Problem: RH Ambulation Goal: LTG Patient will ambulate in home environment (PT) Description: LTG: Patient will ambulate in home environment, # of feet with assistance (PT). Flowsheets (Taken 06/06/2022 0747) LTG: Pt will ambulate in home environ  assist needed:: Moderate Assistance - Patient 50 - 74% LTG: Ambulation distance in home environment: 10'   Problem: RH Wheelchair Mobility Goal: LTG Patient will  propel w/c in community environment (PT) Description: LTG: Patient will propel wheelchair in community environment, # of feet with assist (PT) Flowsheets (Taken 06/06/2022 0747) LTG: Pt will propel w/c in community environ  assist needed:: Moderate Assistance - Patient 50 - 74% Distance: wheelchair distance in controlled environment: 150

## 2022-06-06 NOTE — Progress Notes (Signed)
Occupational Therapy Session Note  Patient Details  Name: Edward Maynard MRN: [id number] Date of Birth: 1933-07-21  Today's Date: 06/06/2022 OT Individual Time: [id number] OT Individual Time Calculation (min): 42 min  OT Individual Time: 1345-1430 OT Individual Time Calculation (min): 45 min   Short Term Goals: Week 3:  OT Short Term Goal 1 (Week 3): STG=LTG d/t Pt ELOS  Skilled Therapeutic Interventions/Progress Updates:     AM Session: Pt received sitting in wc leaning to L side with LUE positioned beside wc wheel. Used Pt phone to take picture of Pt with Pt permission for visual feedback of Pt positioning to increase Pt awareness. Pt able to notice L lean in picture with min questioning cues and arm placement with mod questioning cues. Pt able to self correct to bring L arm into lap and lift trunk to midline with Vb cueing. Pt continuing to be extremely tough on himself and his efforts in therapy sessions stating "I just want to be doing better" with OT providing therapeutic support, motivation, and affirmation throughout session and engaging Pt in light hearted conversation to support moral.   Pt transported total A to therapy gym for time management and energy conservation. Pt completed blocked practice of sit<>stands to work for LLE NMR to work on LLE muscle activation and control to improve standing balance for BADLs. Pt complete sit<>stand x3 trials with first trial Pt completing with min A VB and tactile cues provided for muscle control and activation with L knee blocked for stability. Mirror placed in front of Pt to provided external visual feedback with Pt able to bring trunk to midline with VB cueing maintaining stand ~20 seconds. During second trial, Pt mod A to come to standing and able to maintain standing balance ~5 seconds before reporting need to sit less uncontrolled descent this trial d/t Pt fatigue and anxiousness in standing. Pt provided extended rest break then completing third  sit<>stand similar to first trial maintaining balance with BUEs supported on table. Facilitated weight bearing through LUE during task to increase awareness and control of motor movements.   Pt completed functional reaching task for LUE NMR with emphasis on grasp/release, wrist extension, and targeted reach. Pt stacked textured cones 3 x3 trials with min-mod hand under hand A provided to increase control and facilitate full ROM. Pt continuing to present with ataxia, decreased coordination, and motor planning challenges. Pt fatigued at end of session and requesting to return to bed.  Pt transported back to room total A in wc. Wc>EOB mod A with tactile and Vb cues provided. EOB>supine mod A with increased cueing required d/t Pt fatigue. Pt left resting in bed with bed alarm on, call bell in reach, and all needs met.   PM Session:  Pt received deeply sleeping in bed easy to wake however requiring increased time to participate in session d/t fatigue. Pt transitioned to EOB mod A with HOB elevated using bed features with mod cueing provided for technique. Squat pivot EOB>wc to R side mod A with cueing provided for technique. Pt transported total A to therapy gym for time management and energy conservation. Pt completed squat pivot>EOM to R side similar as previous transfer. Pt completed series of NMR and functional activities sitting EOM to increase functional use of LUE and increase Pt attention to L side: -Modified single arm L sided push up with Pt instructed to lower onto L elbow and push trough hand/engage trunk to lift self to midline. Mirror placed in front of Pt  to increase awareness and provided external visual cue. OT facilitating weightbearing and stability through LUE during task with Pt completing at overall min A level.  -Bean bags placed to Pt L side with Pt instructed to look to L side to locate bean bags, lower onto L elbow, and utilize RUE to retrieve bean bag and place it on his R side. Pt  able to complete task with mod cueing for attention and min A to lift trunk with OT stabilizing LLE.  -Functional reaching and grasp/release activity with bean bags presented anteriorly at eye level to Pt. Pt able to retrieve bean bag with min A at elbow and tactile cueing to facilitate increase motor control. Max VB cueing provided for technique and to facilitate maintained grasp on bean bag. Pt then threw bean bag to facilitate increased wrist extension.  Pt fatigued at end of session. Provided water with straw, tolerating well without coughing. Squat pivot EOM>wc mod A with cueing similar to previous. Pt transported back to room total A in wc and left resting in wc with call bell in reach, seat belt alarm on, and all needs met.   Therapy Documentation Precautions:  Precautions Precautions: Fall Precaution Comments: Left hemi; Left homonymous hemianopsia Restrictions Weight Bearing Restrictions: No  Therapy/Group: Individual Therapy  Janey Genta 06/06/2022, 11:07 AM

## 2022-06-07 MED ORDER — ACETAMINOPHEN 325 MG PO TABS: 650.0000 mg | ORAL_TABLET | ORAL | Status: AC | PRN

## 2022-06-07 NOTE — Progress Notes (Signed)
Speech Language Pathology Daily Session Note  Patient Details  Name: HASIB CHOATE MRN: [id number] Date of Birth: [birth year]  Today's Date: 06/07/2022 SLP Individual Time: 0900-0945 SLP Individual Time Calculation (min): 45 min  Short Term Goals: Week 3: SLP Short Term Goal 1 (Week 3): Pt will trial regular textures with functional oral phase and no overt s/sx concering for airway intrusion with Mod I for implementation of safe swallowing strategies. SLP Short Term Goal 2 (Week 3): Patient will scan left of midline during functional tasks with mod A verbal cues to achieve 70% accuracy SLP Short Term Goal 3 (Week 3): Pt will complete basic, functional problem-solving tasks with 70% accuracy given Min-Mod A verbal and/or visual cues.  Skilled Therapeutic Interventions:  Pt was seen in am to address cognitive re- training. Pt was alert and seen at bedside. He endorsed fatigue following shower with OT however, he was agreeable for ST session. SLP continued to address scan of left midline during functional tasks. Pt with significantly improved awareness to left side this date c/b verbalizing instruction of anchor technique indep. Pt requiring min to mod A to identify information when looking at a calendar and therapy schedule. Pt does continue to present with deficits in immediate and short term recall c/b repeating questions multiple times within a short period of time. Given success during aforementioned tasks, SLP challenged pt in identifying objects within immediate surrounding to address scanning of left midline. Pt required min A to locate items within room. He required min to mod A to identify pertinent information on white board (I.e. nurse and tech's name). Pt was left at bedside with call button within reach. SLP to continue POC.   Pain No pain  Therapy/Group: Individual Therapy  Colin Benton 06/07/2022, 11:15 AM

## 2022-06-07 NOTE — Progress Notes (Signed)
Patient ID: Edward Maynard, male   DOB: 1933/10/10, 89 y.o.   MRN: [id number]  SW received FU from [facility]. Facility ready to take patient tomorrow. SW reached out to daughter to discuss, left message. Sw will wait for FU.

## 2022-06-07 NOTE — Progress Notes (Addendum)
Patient ID: Edward Maynard, male   DOB: [birth year], 89 y.o.   MRN: [id number]  SW left for for AD. [place] at [facility]. Sw will wait for FU.  3:11 PM: Patient has received a bed offer at [facility]  Patient will discharge to SNF tomorrow.  Auth ID updated to [id number]

## 2022-06-07 NOTE — Plan of Care (Signed)
  Problem: Consults Goal: RH STROKE PATIENT EDUCATION Description: See Patient Education module for education specifics  Outcome: Progressing   Problem: RH BOWEL ELIMINATION Goal: RH STG MANAGE BOWEL WITH ASSISTANCE Description: STG Manage Bowel with  toileting Assistance. Outcome: Progressing Goal: RH STG MANAGE BOWEL W/MEDICATION W/ASSISTANCE Description: STG Manage Bowel with Medication with mod I Assistance. Outcome: Progressing   Problem: RH BLADDER ELIMINATION Goal: RH STG MANAGE BLADDER WITH ASSISTANCE Description: STG Manage Bladder With toileting Assistance Outcome: Progressing   Problem: RH COGNITION-NURSING Goal: RH STG USES MEMORY AIDS/STRATEGIES W/ASSIST TO PROBLEM SOLVE Description: STG Uses Memory Aids/Strategies With cues  Assistance to Problem Solve. Outcome: Progressing   Problem: RH KNOWLEDGE DEFICIT Goal: RH STG INCREASE KNOWLEDGE OF DIABETES Description: [name]/SIL and caregiver will be able to manage DM/prediabetes with dietary modification using educational resources independently Outcome: Progressing Goal: RH STG INCREASE KNOWLEDGE OF HYPERTENSION Description: [name]/SIL and caregiver will be able to manage HTN with medications and dietary modification using educational resources independently Outcome: Progressing Goal: RH STG INCREASE KNOWLEDGE OF DYSPHAGIA/FLUID INTAKE Description: [name]/SIL and caregiver will be able to manage Dysphagia, medications, and dietary modification using educational resources independently Outcome: Progressing Goal: RH STG INCREASE KNOWLEGDE OF HYPERLIPIDEMIA Description: [name]/SIL and caregiver will be able to manage HLD with medications and dietary modification using educational resources independently Outcome: Progressing   Problem: RH KNOWLEDGE DEFICIT Goal: RH STG INCREASE KNOWLEDGE OF STROKE PROPHYLAXIS Description: [name]/SIL and caregiver will be able to  manage secondary risks with medications and dietary modification using educational resources independently Outcome: Progressing   Problem: Education: Goal: Understanding of CV disease, CV risk reduction, and recovery process will improve Outcome: Progressing Goal: Individualized Educational Video(s) Outcome: Progressing   Problem: Activity: Goal: Ability to return to baseline activity level will improve Outcome: Progressing   Problem: Cardiovascular: Goal: Ability to achieve and maintain adequate cardiovascular perfusion will improve Outcome: Progressing Goal: Vascular access site(s) Level 0-1 will be maintained Outcome: Progressing   Problem: Health Behavior/Discharge Planning: Goal: Ability to safely manage health-related needs after discharge will improve Outcome: Progressing   Problem: Education: Goal: Knowledge of disease or condition will improve Outcome: Progressing Goal: Knowledge of secondary prevention will improve (MUST DOCUMENT ALL) Outcome: Progressing Goal: Knowledge of patient specific risk factors will improve Elta [place] N/A or DELETE if not current risk factor) Outcome: Progressing   Problem: Ischemic Stroke/TIA Tissue Perfusion: Goal: Complications of ischemic stroke/TIA will be minimized Outcome: Progressing   Problem: Coping: Goal: Will verbalize positive feelings about self Outcome: Progressing Goal: Will identify appropriate support needs Outcome: Progressing   Problem: Health Behavior/Discharge Planning: Goal: Ability to manage health-related needs will improve Outcome: Progressing Goal: Goals will be collaboratively established with patient/family Outcome: Progressing   Problem: Self-Care: Goal: Ability to participate in self-care as condition permits will improve Outcome: Progressing Goal: Verbalization of feelings and concerns over difficulty with self-care will improve Outcome: Progressing Goal: Ability to communicate needs accurately will  improve Outcome: Progressing   Problem: Nutrition: Goal: Risk of aspiration will decrease Outcome: Progressing Goal: Dietary intake will improve Outcome: Progressing

## 2022-06-07 NOTE — Patient Care Conference (Signed)
Inpatient RehabilitationTeam Conference and Plan of Care Update Date: 06/07/2022   Time: 10:46 AM    Patient Name: Edward Maynard      Medical Record Number: [id number]  Date of Birth: [birth year] Sex: Male         Room/Bed: 4W02C/4W02C-01 Payor Info: Payor: [employer] MEDICARE / Plan: [facility] MEDICARE / Product Type: *No Product type* /    Admit Date/Time:  05/18/2022  3:01 PM  Primary Diagnosis:  Right middle cerebral artery stroke [facility])  Hospital Problems: Principal Problem:   Right middle cerebral artery stroke [facility])    Expected Discharge Date: Expected Discharge Date:  (SNF pending)  Team Members Present: Physician leading conference: Dr. Alysia Penna Social Worker Present: Erlene Quan, BSW Nurse Present: Dorien Chihuahua, RN PT Present: Page Spiro, PT OT Present: Other (comment) Lowella Fairy, OT) SLP Present: Weston Anna, SLP PPS Coordinator present : Gunnar Fusi, SLP     Current Status/Progress Goal Weekly Team Focus  Bowel/Bladder   Continent of B/B. LBM 06/05/22   Maintain B/B function.   Assist with Toileting needs    Swallow/Nutrition/ Hydration   Dys. 3 textures with thin liquids, Mod I   Mod I  May trial regular textures    ADL's   Grooming/hygiene min A with max cueing, UB dressing mod A, LB dressing max A, max A toileting, mod A UB bathing, mod/max LB bathing, squat pivot transfes to BSC or tub bench mod A with max cueing for safety; Pt attempting to use R arm more during functional activities (heat pad is assisting with pain management); Increased fucntional use of LUE with mild improvement in L side awareness; continues to present with decreased proprioception, motor planning, and sensation   Goals downgraded to min-mod A level   L NMR, L visual scanning, family education, d/c planning, activity tolerance training, pain management, fucntional mobility, BADL retraining, L-sided awareness, motor planning, midline orientation, dynamic  sitting balace, fucntional transfers    Mobility   mod assist bed mobility using bed features, mod assist squat pivot transfers, min/mod assist sit<>stands but mod assist static standing balance and +2 mod assist dynamic standing balance, +2 mod assist gait up to 145ft with mod to total assist to position/place L LE after swing phase due to impaired proprioception - pt with R shoulder injury on Sunday 05/28/22 resulting in decreased ability to utilize that UE to compensate for L hemiparesis requiring increased assist for mobility - initiated family education with change in POC to D/C SNF   downgraded to mod assist overall  pt/family education, bed mobility training, transfer training, gait training, L hemibody NMR, L attention, motor planning, midline orientation    Communication                Safety/Cognition/ Behavioral Observations  Mod A   Supervision-Min A   left visual scanning, functional problem solving, emergent awareness    Pain   Denies.   Remain pain free   Assess QS and prn.    Skin   Intact   Maintain skin Integrity  Assess QS and prn.      Discharge Planning:  Discharge to SNF   Team Discussion: Patient with decreased proprioception, cognitively distracted, hyper verbose, with left inattention and poor awareness post right MCA CVA and right shoulder pain. (RTC instability).  Patient on target to meet rehab goals: no, currently needs mod assist for upper body care and mod- max assist for lower body care with mod assist for transfer to the  TTB with max cues. Maintained on a D3 - thin liquid diet with dentures and needs min - mod assist for cognition. He will not meet goals for SLP.  *See Care Plan and progress notes for long and short-term goals.   Revisions to Treatment Plan:  Downgraded goals to min - mod assist   Teaching Needs: Safety, medications, transfers, toileting, etc.  Current Barriers to Discharge: Decreased caregiver support and Home  enviroment access/layout  Possible Resolutions to Barriers: SNF recommended Family education     Medical Summary Current Status: RIght shoulder with movement  persists, better nocturnal pain s/p injection, sensory deficits persist, unable to meet family goals  Barriers to Discharge: Medical stability   Possible Resolutions to [employer] Focus: Non pharmaceutical management of shoulder pain , looking at SNF options   Continued Need for Acute Rehabilitation Level of Care: The patient requires daily medical management by a physician with specialized training in physical medicine and rehabilitation for the following reasons: Direction of a multidisciplinary physical rehabilitation program to maximize functional independence : Yes Medical management of patient stability for increased activity during participation in an intensive rehabilitation regime.: Yes Analysis of laboratory values and/or radiology reports with any subsequent need for medication adjustment and/or medical intervention. : Yes   I attest that I was present, lead the team conference, and concur with the assessment and plan of the team.   Dorien Chihuahua B 06/07/2022, 3:26 PM

## 2022-06-07 NOTE — Discharge Summary (Signed)
Physician Discharge Summary  Patient ID: [name]: [id number] DOB/AGE: 1934/03/18 89 y.o.  Admit date: 05/18/2022 Discharge date: 06/08/2022  Discharge Diagnoses:  Principal Problem:   Right middle cerebral artery stroke [facility]) DVT prophylaxis Right shoulder pain suspect rotator cuff strain versus aggravation of osteoarthritis Dysphagia Hypertension Atrial fibrillation Hyperlipidemia Parotid mass CKD stage III Mood stabilization   Discharged Condition: Stable  Significant Diagnostic Studies: DG Shoulder Right  Result Date: 05/29/2022 CLINICAL DATA:  [id number] Acute pain of right shoulder [id number] EXAM: RIGHT SHOULDER - 2+ VIEW COMPARISON:  None Available. FINDINGS: There is no evidence of acute fracture. Alignment is normal. There is moderate glenohumeral and AC joint osteoarthritis. High-riding humeral head with diminished acromial humeral interval. IMPRESSION: No evidence of acute fracture or dislocation. Moderate glenohumeral and AC joint osteoarthritis. High-riding humeral head suggesting chronic distal rotator cuff disease. Electronically Signed   By: Maurine Simmering M.D.   On: 05/29/2022 16:22   CT HEAD WO CONTRAST (5MM)  Result Date: 05/16/2022 CLINICAL DATA:  Stroke follow-up EXAM: CT HEAD WITHOUT CONTRAST TECHNIQUE: Contiguous axial images were obtained from the base of the skull through the vertex without intravenous contrast. RADIATION DOSE REDUCTION: This exam was performed according to the departmental dose-optimization program which includes automated exposure control, adjustment of the mA and/or kV according to patient size and/or use of iterative reconstruction technique. COMPARISON:  05/10/2022 FINDINGS: Brain: Known acute infarct in the posterior division of the right MCA territory with petechial hemorrhage. No hematoma or malignant swelling. Chronic bilateral cerebellar and right thalamic infarcts. No hemorrhage, hydrocephalus, or masslike finding. Vascular: No new  abnormality Skull: Negative Sinuses/Orbits: Negative Other: Partially covered right parotid mass measuring 15 mm. IMPRESSION: 1. No new abnormality. Large right posterior division MCA territory infarct with mild petechial hemorrhage. 2. Chronic cerebellar and thalamic small vessel infarcts. 3. 15 mm right parotid mass. Electronically Signed   By: Jorje Guild M.D.   On: 05/16/2022 07:03   DG Swallowing Func-Speech Pathology  Result Date: 05/11/2022 Table formatting from the original result was not included. Modified Barium Swallow Study Patient Details Name: Edward Maynard MRN: [id number] Date of Birth: [birth year] Today's Date: 05/11/2022 HPI/PMH: HPI: 89 year old male with atrial fibrillation (on Xarelto), prior stroke, presenting with acute onset of left hemiplegia and left hemineglect after sustaining a fall to the left while at home this evening. Underwent thrombectomy Clinical Impression: Clinical Impression: Pt demonstrates moderate to severe oral dysphagia witn left CN VII weakness and sensory impairment leading to severe anterior spillage with cup sips of liquids. Lingual, buccal and palatal residue present with most trials. Thin residue spills to pharynx post swallow. There is delayed swallow initaition with all liquids. No aspiration of thin occurred until pt attempted consecutive straw sips (PAS 7); however, there is higher risk with thin and severe anterior spillage. Nectar was better controlled orally and with a straw, which reduced oral residue and spillage. Pt unable to masticate solids without dentures and lingual manipulation of bolus very poor. Recommend purees and nectar thick liquids while pt learns strategies and improves awareness. Can quickly upgrade to thin as pt demonstrates awareness and control without severe coughing Factors that may increase risk of adverse event in presence of aspiration (Zeb 2021): Factors that may increase risk of adverse event in presence of  aspiration ([place] 2021): Limited mobility; Reduced cognitive function Recommendations/Plan: Swallowing Evaluation Recommendations Swallowing Evaluation Recommendations Liquid Administration via: Straw; Cup Medication Administration: Whole meds with puree Supervision: Full  supervision/cueing for swallowing strategies Swallowing strategies  : Slow rate; Small bites/sips; Minimize environmental distractions; Check for anterior loss Postural changes: Stay upright 30-60 min after meals Treatment Plan Treatment Plan Treatment recommendations: Therapy as outlined in treatment plan below Follow-up recommendations: Acute inpatient rehab (3 hours/day) Functional status assessment: Patient has had a recent decline in their functional status and demonstrates the ability to make significant improvements in function in a reasonable and predictable amount of time. Treatment frequency: Min 2x/week Treatment duration: 2 weeks Interventions: Aspiration precaution training; Diet toleration management by SLP; Trials of upgraded texture/liquids; Patient/family education; Compensatory techniques Recommendations Recommendations for follow up therapy are one component of a multi-disciplinary discharge planning process, led by the attending physician.  Recommendations may be updated based on patient status, additional functional criteria and insurance authorization. Assessment: Orofacial Exam: Orofacial Exam Oral Cavity - Dentition: Dentures, top; Dentures, bottom Anatomy: Anatomy: WFL Thin Liquids: Thin Liquids (Level 0) Thin Liquids : Impaired Bolus delivery method: Cup Thin Liquid - Impairment: Oral Impairment; Pharyngeal impairment Lip Closure: Escape beyond mid-chin Tongue control during bolus hold: Escape to lateral buccal cavity/floor of mouth Bolus transport/lingual motion: Repetitive/disorganized tongue motion Oral residue: Residue collection on oral structures Location of oral residue : Floor of mouth; Lateral sulci;  Tongue Initiation of swallow : Pyriform sinuses Soft palate elevation: No bolus between soft palate (SP)/pharyngeal wall (PW) Laryngeal elevation: Complete superior movement of thyroid cartilage with complete approximation of arytenoids to epiglottic petiole Anterior hyoid excursion: Complete Epiglottic movement: Complete Laryngeal vestibule closure: Complete, no air/contrast in laryngeal vestibule Pharyngeal stripping wave : Present - complete Pharyngeal contraction (A/P view only): N/A Pharyngoesophageal segment opening: Complete distension and complete duration, no obstruction of flow Tongue base retraction: Trace column of contrast or air between tongue base and PPW Pharyngeal residue: Trace residue within or on pharyngeal structures Location of pharyngeal residue: Valleculae Penetration/Aspiration Scale (PAS) score: 7.  Material enters airway, passes BELOW cords and not ejected out despite cough attempt by patient; 1.  Material does not enter airway (aspiration with straw only)  Mildly Thick Liquids: Mildly thick liquids (Level 2, nectar thick) Mildly thick liquids (Level 2, nectar thick): Impaired Bolus delivery method: Cup Mildly Thick Liquid - Impairment: Oral Impairment; Pharyngeal impairment Lip Closure: Escape beyond mid-chin Tongue control during bolus hold: Escape to lateral buccal cavity/floor of mouth Bolus transport/lingual motion: Repetitive/disorganized tongue motion Oral residue: Residue collection on oral structures Location of oral residue : Lateral sulci; Floor of mouth Initiation of swallow : Pyriform sinuses Soft palate elevation: No bolus between soft palate (SP)/pharyngeal wall (PW) Laryngeal elevation: Complete superior movement of thyroid cartilage with complete approximation of arytenoids to epiglottic petiole Anterior hyoid excursion: Complete Epiglottic movement: Complete Laryngeal vestibule closure: Complete, no air/contrast in laryngeal vestibule Pharyngeal stripping wave : Present -  complete Pharyngeal contraction (A/P view only): Complete; N/A Pharyngoesophageal segment opening: Complete distension and complete duration, no obstruction of flow Tongue base retraction: Trace column of contrast or air between tongue base and PPW Pharyngeal residue: Trace residue within or on pharyngeal structures Location of pharyngeal residue: Valleculae  Moderately Thick Liquids: Moderately thick liquids (Level 3, honey thick) Moderately thick liquids (Level 3, honey thick): Impaired Bolus delivery method: Spoon Moderately Thick Liquid - Impairment: Oral Impairment Lip Closure: Escape from interlabial space or lateral juncture, no extension beyond vermillion border Tongue control during bolus hold: Escape to lateral buccal cavity/floor of mouth Bolus transport/lingual motion: Repetitive/disorganized tongue motion Oral residue: Residue collection on oral structures Location of oral  residue : Floor of mouth; Lateral sulci Initiation of swallow : Posterior laryngeal surface of the epiglottis  Puree: Puree Puree: Impaired Puree - Impairment: Oral Impairment Lip Closure: Interlabial escape, no progression to anterior lip Bolus transport/lingual motion: Repetitive/disorganized tongue motion Oral residue: Trace residue lining oral structures Location of oral residue : Tongue Initiation of swallow: Valleculae Solid: Solid Solid: Impaired Solid - Impairment: Oral Impairment Lip Closure: Escape beyond mid-chin Bolus preparation/mastication: Minimal chewing/mashing with majority of bolus unchewed Bolus transport/lingual motion: Repetitive/disorganized tongue motion Oral residue: Majority of bolus remaining Location of oral residue : Lateral sulci Pill: Pill Pill: Not Tested Compensatory Strategies: Compensatory Strategies Compensatory strategies: Yes Straw: Effective Effective Straw: Mildly thick liquid (Level 2, nectar thick) (aspiration with thin)   General Information: Caregiver present: No  Diet Prior to this Study:  NPO   No data recorded  No data recorded  Supplemental O2: None (Room air)   History of Recent Intubation: No  Behavior/Cognition: Alert; Cooperative; Pleasant mood No data recorded Baseline vocal quality/speech: Normal Volitional Cough: Able to elicit Volitional Swallow: Able to elicit No data recorded Goal Planning: Prognosis for improved oropharyngeal function: Good Barriers to Reach Goals: Cognitive deficits No data recorded No data recorded Consulted and agree with results and recommendations: Patient; Family member/caregiver Pain: Pain Assessment Pain Assessment: Faces Faces Pain Scale: 0 Pain Intervention(s): Monitored during session End of Session: Start Time:SLP Start Time (ACUTE ONLY): 1345 Stop Time: SLP Stop Time (ACUTE ONLY): 1405 Time Calculation:SLP Time Calculation (min) (ACUTE ONLY): 20 min Charges: SLP Evaluations $ SLP Speech Visit: 1 Visit SLP Evaluations $BSS Swallow: 1 Procedure $MBS Swallow: 1 Procedure $ SLP EVAL LANGUAGE/SOUND PRODUCTION: 1 Procedure $Swallowing Treatment: 1 Procedure $Speech Treatment for Individual: 1 Procedure SLP visit diagnosis: SLP Visit Diagnosis: Dysphagia, oropharyngeal phase (R13.12) Past Medical History: Past Medical History: Diagnosis Date  High cholesterol   05/2013; no defecits  Hyperlipemia   Left arm weakness   Left hand weakness   Personal history of colonic polyp - adenoma  10/23/2013  Stroke ([place]) 05/2013  MRI on 07/02/13 = Multifocal acute & subacute infarction involving right frontal MCA/ACA & left parietal MCA/PCA watershed areas Past Surgical History: Past Surgical History: Procedure Laterality Date  IR CT HEAD LTD  05/10/2022  IR PERCUTANEOUS ART THROMBECTOMY/INFUSION INTRACRANIAL INC DIAG ANGIO  05/10/2022  LOOP RECORDER IMPLANT N/A 10/31/2013  Procedure: LOOP RECORDER IMPLANT;  Surgeon: Coralyn Mark, MD;  Location: [facility] CATH LAB;  Service: Cardiovascular;  Laterality: N/A;  RADIOLOGY WITH ANESTHESIA N/A 05/10/2022  Procedure: IR WITH ANESTHESIA;  Surgeon:  Luanne Bras, MD;  Location: [facility];  Service: Radiology;  Laterality: N/A;  SHOULDER SURGERY Bilateral 2003, 1995 [place], Katherene Ponto 05/11/2022, 2:20 PM  IR PERCUTANEOUS ART THROMBECTOMY/INFUSION INTRACRANIAL INC DIAG ANGIO  Result Date: 05/11/2022 INDICATION: Worsening left-sided weakness and right gaze deviation. Occluded distal M2 segment of the inferior division of the right middle cerebral artery on CT angiogram of the head and neck. EXAM: 1. EMERGENT LARGE VESSEL OCCLUSION THROMBOLYSIS anterior CIRCULATION) COMPARISON:  CT angiogram of the head and neck of May 10, 2022. MEDICATIONS: Ancef 2 g IV antibiotic was administered within 1 hour of the procedure. ANESTHESIA/SEDATION: General anesthesia. CONTRAST:  Omnipaque 300 approximately 70 mL. FLUOROSCOPY TIME:  Fluoroscopy Time: 41 minutes 16 seconds (2313 mGy). COMPLICATIONS: None immediate. TECHNIQUE: Following a full explanation of the procedure along with the potential associated complications, an informed witnessed consent was obtained. The risks of intracranial hemorrhage of 10%, worsening neurological deficit, ventilator dependency,  death and inability to revascularize were all reviewed in detail with the patient's spouse. The patient was then put under general anesthesia by the Department of Anesthesiology at [facility]. The right groin was prepped and draped in the usual sterile fashion. Thereafter using modified Seldinger technique, transfemoral access into the right common femoral artery was obtained without difficulty. Over a 0.035 inch guidewire an 8 French 25 cm Pinnacle sheath was inserted. Through this a combination of a 125 cm 5 [place] Berenstein support catheter inside of an 087 95 cm balloon guide catheter was advanced initially proximal to the right common carotid bifurcation and then into the distal right internal carotid artery. FINDINGS: The right common carotid bifurcation demonstrates the right external  carotid artery and its major branches to be widely patent. The right internal carotid artery at the bulb demonstrates a smooth shallow plaque along the posterior wall associated with 10% stenosis by the NASCET criteria. Distal to this the right internal carotid artery demonstrates opacification to the cranial skull base. The petrous, the cavernous and the supraclinoid right ICA demonstrate wide patency. A right posterior communicating artery is seen opacifying the right posterior cerebral artery distribution. The right anterior cerebral artery opacifies into the capillary and venous phases. The right middle cerebral artery M1 segment is widely patent. There is an occlusion of a parietal branch of the inferior division of the right middle cerebral artery in the distal M3 region. Delayed arterial and capillary phase demonstrates significant retrograde opacification of the distal right parietal cortical and subcortical distribution from the anterior cerebral and the P3 leptomeningeal branches of the right posterior cerebral artery. PROCEDURE: Through the balloon guide catheter in the distal right internal carotid artery, a combination of an 045 Zoom aspiration catheter with a 160 cm Trevo microcatheter was advanced over an 014 inch soft tip micro guidewire with a moderate J configuration to the supraclinoid right ICA. The micro guidewire was then gently manipulated with a torque device and advanced without difficulty distal to the occluded branch of the inferior division into the M3 region followed by the microcatheter. A 3 mm x 20 mm Solitaire X retrieval device was then deployed following verification of safe positioning of the tip of the microcatheter. This was then followed by the advancement of the Zoom aspiration catheter which was engaged just inside the occluded portion of the vessel, the micro guidewire and the microcatheter were retrieved proximally. Following 2 minutes of contact aspiration with a Penumbra  aspiration device and proximal flow arrest, the Zoom aspiration catheter was removed. Two approximately 1 mm pearly sticky clots were retrieved. A control arteriogram demonstrated no significant change in the occluded parietal branch. A second pass was then made using the above combination. The microcatheter was advanced past the occlusion into the M3 region with the micro guidewire. The guidewire was removed. Good aspiration obtained from the hub of the microcatheter which was then connected to continuous heparinized saline infusion. The Zoom aspiration catheter was then engaged just inside the occluded segment of the vessel. A 3 mm x 20 mm Solitaire X retrieval device was then deployed in the usual manner. Thereafter, constant aspiration was applied with proximal flow arrest the hub of the Zoom aspiration catheter for a minute and a half. Following this the combination of the retrieval device, the microcatheter and the aspiration catheter were removed. A control arteriogram performed through the balloon guide catheter following reversal of flow arrest now demonstrated improved flow through the occluded branch though slow to  the M3 M4 regions. A third pass was then made using a combination using an 035 inch aspiration catheter advanced over an 014 inch soft tip micro guidewire with a moderate J configuration to the right middle cerebral artery. Access into the now reoccluded inferior division M2 branch was then made with the micro guidewire followed by the microcatheter into the distal M3 region. The guidewire was removed. The 035 inch Zoom aspiration was then engaged into the clot with no aspiration obtained. Aspiration was then applied with a Penumbra aspiration device for approximately 2 minutes with proximal flow arrest. The aspiration catheter was removed. A control arteriogram performed through the balloon guide catheter demonstrated mild improvement in revascularization of the occluded branch with the clot  noted slightly more distally. A fourth pass was then made this time again using an 045 Zoom aspiration catheter advanced with an 021 microcatheter and over 014 inch micro guidewire. This combination was then advanced into the M3 region of the occluded branch followed by the microcatheter. The guidewire was removed. Good aspiration obtained from the hub of the microcatheter which was positioned distally in the M3 segment. This was then connected to continuous heparinized saline infusion. A 4 mm x 40 mm Solitaire X retrieval stent was then deployed in the usual manner with the 045 Zoom aspiration catheter imbedded in the occluded branch. Aspiration was then for approximately 2 minutes at the hub of the aspiration catheter with proximal flow arrest. Thereafter, the retriever, the microcatheter and the Zoom aspiration catheter were removed. A control arteriogram performed following reversal of flow arrest in the right internal carotid artery, continued to demonstrate slow antegrade flow distal to the occluded segment. A control arteriogram performed through the balloon guide catheter continued to demonstrate a TICI 2C over revascularization with only mild distal flow past the occluded M2 branch of the right inferior division MCA. No further passes were made due to now steadily increased risk of vessel injury perforation. The balloon guide was retrieved in the right common carotid artery. A control arteriogram performed through this continued to demonstrate patency of right internal carotid artery proximally and distally including that of the right posterior communicating artery, the right anterior cerebral artery and the right middle cerebral artery distributions. An 8 French Angio-Seal closure device was then deployed in the right groin puncture site for hemostasis. Distal pulses remained Dopplerable in both feet unchanged from prior to the procedure. A flat panel CT of the brain demonstrated no evidence of intra  cerebral hemorrhage. The patient's general anesthesia was reversed, and patient was extubated. Initially agitated, the patient gradually settled down to where simple commands appropriately and verbalized appropriately. The patient moved his left lower extremities spontaneously and to command with good strength. Slight movement was evident in the left upper extremity. The patient was then transferred to the neuro ICU for post revascularization management. IMPRESSION: Status post endovascular revascularization of occluded distal M2 branch of the right middle cerebral artery inferior division with 2 passes with a 3 mm x 20 mm retrieval device and contact aspiration, 1 pass with a 4 mm x 40 mm Solitaire X retrieval device and contact aspiration, 1 pass with contact aspiration with minimal improvement in revascularization of the occluded right MCA inferior division M2 segment occlusion. A TICI 2C revascularization was maintained. PLAN: Follow-up as per referring MD. Electronically Signed   By: Luanne Bras M.D.   On: 05/11/2022 08:28   IR CT Head Ltd  Result Date: 05/11/2022 INDICATION: Worsening left-sided weakness  and right gaze deviation. Occluded distal M2 segment of the inferior division of the right middle cerebral artery on CT angiogram of the head and neck. EXAM: 1. EMERGENT LARGE VESSEL OCCLUSION THROMBOLYSIS anterior CIRCULATION) COMPARISON:  CT angiogram of the head and neck of May 10, 2022. MEDICATIONS: Ancef 2 g IV antibiotic was administered within 1 hour of the procedure. ANESTHESIA/SEDATION: General anesthesia. CONTRAST:  Omnipaque 300 approximately 70 mL. FLUOROSCOPY TIME:  Fluoroscopy Time: 41 minutes 16 seconds (2313 mGy). COMPLICATIONS: None immediate. TECHNIQUE: Following a full explanation of the procedure along with the potential associated complications, an informed witnessed consent was obtained. The risks of intracranial hemorrhage of 10%, worsening neurological deficit,  ventilator dependency, death and inability to revascularize were all reviewed in detail with the patient's spouse. The patient was then put under general anesthesia by the Department of Anesthesiology at [facility]. The right groin was prepped and draped in the usual sterile fashion. Thereafter using modified Seldinger technique, transfemoral access into the right common femoral artery was obtained without difficulty. Over a 0.035 inch guidewire an 8 French 25 cm Pinnacle sheath was inserted. Through this a combination of a 125 cm 5 [place] Berenstein support catheter inside of an 087 95 cm balloon guide catheter was advanced initially proximal to the right common carotid bifurcation and then into the distal right internal carotid artery. FINDINGS: The right common carotid bifurcation demonstrates the right external carotid artery and its major branches to be widely patent. The right internal carotid artery at the bulb demonstrates a smooth shallow plaque along the posterior wall associated with 10% stenosis by the NASCET criteria. Distal to this the right internal carotid artery demonstrates opacification to the cranial skull base. The petrous, the cavernous and the supraclinoid right ICA demonstrate wide patency. A right posterior communicating artery is seen opacifying the right posterior cerebral artery distribution. The right anterior cerebral artery opacifies into the capillary and venous phases. The right middle cerebral artery M1 segment is widely patent. There is an occlusion of a parietal branch of the inferior division of the right middle cerebral artery in the distal M3 region. Delayed arterial and capillary phase demonstrates significant retrograde opacification of the distal right parietal cortical and subcortical distribution from the anterior cerebral and the P3 leptomeningeal branches of the right posterior cerebral artery. PROCEDURE: Through the balloon guide catheter in the distal right  internal carotid artery, a combination of an 045 Zoom aspiration catheter with a 160 cm Trevo microcatheter was advanced over an 014 inch soft tip micro guidewire with a moderate J configuration to the supraclinoid right ICA. The micro guidewire was then gently manipulated with a torque device and advanced without difficulty distal to the occluded branch of the inferior division into the M3 region followed by the microcatheter. A 3 mm x 20 mm Solitaire X retrieval device was then deployed following verification of safe positioning of the tip of the microcatheter. This was then followed by the advancement of the Zoom aspiration catheter which was engaged just inside the occluded portion of the vessel, the micro guidewire and the microcatheter were retrieved proximally. Following 2 minutes of contact aspiration with a Penumbra aspiration device and proximal flow arrest, the Zoom aspiration catheter was removed. Two approximately 1 mm pearly sticky clots were retrieved. A control arteriogram demonstrated no significant change in the occluded parietal branch. A second pass was then made using the above combination. The microcatheter was advanced past the occlusion into the M3 region with the micro  guidewire. The guidewire was removed. Good aspiration obtained from the hub of the microcatheter which was then connected to continuous heparinized saline infusion. The Zoom aspiration catheter was then engaged just inside the occluded segment of the vessel. A 3 mm x 20 mm Solitaire X retrieval device was then deployed in the usual manner. Thereafter, constant aspiration was applied with proximal flow arrest the hub of the Zoom aspiration catheter for a minute and a half. Following this the combination of the retrieval device, the microcatheter and the aspiration catheter were removed. A control arteriogram performed through the balloon guide catheter following reversal of flow arrest now demonstrated improved flow through the  occluded branch though slow to the M3 M4 regions. A third pass was then made using a combination using an 035 inch aspiration catheter advanced over an 014 inch soft tip micro guidewire with a moderate J configuration to the right middle cerebral artery. Access into the now reoccluded inferior division M2 branch was then made with the micro guidewire followed by the microcatheter into the distal M3 region. The guidewire was removed. The 035 inch Zoom aspiration was then engaged into the clot with no aspiration obtained. Aspiration was then applied with a Penumbra aspiration device for approximately 2 minutes with proximal flow arrest. The aspiration catheter was removed. A control arteriogram performed through the balloon guide catheter demonstrated mild improvement in revascularization of the occluded branch with the clot noted slightly more distally. A fourth pass was then made this time again using an 045 Zoom aspiration catheter advanced with an 021 microcatheter and over 014 inch micro guidewire. This combination was then advanced into the M3 region of the occluded branch followed by the microcatheter. The guidewire was removed. Good aspiration obtained from the hub of the microcatheter which was positioned distally in the M3 segment. This was then connected to continuous heparinized saline infusion. A 4 mm x 40 mm Solitaire X retrieval stent was then deployed in the usual manner with the 045 Zoom aspiration catheter imbedded in the occluded branch. Aspiration was then for approximately 2 minutes at the hub of the aspiration catheter with proximal flow arrest. Thereafter, the retriever, the microcatheter and the Zoom aspiration catheter were removed. A control arteriogram performed following reversal of flow arrest in the right internal carotid artery, continued to demonstrate slow antegrade flow distal to the occluded segment. A control arteriogram performed through the balloon guide catheter continued to  demonstrate a TICI 2C over revascularization with only mild distal flow past the occluded M2 branch of the right inferior division MCA. No further passes were made due to now steadily increased risk of vessel injury perforation. The balloon guide was retrieved in the right common carotid artery. A control arteriogram performed through this continued to demonstrate patency of right internal carotid artery proximally and distally including that of the right posterior communicating artery, the right anterior cerebral artery and the right middle cerebral artery distributions. An 8 French Angio-Seal closure device was then deployed in the right groin puncture site for hemostasis. Distal pulses remained Dopplerable in both feet unchanged from prior to the procedure. A flat panel CT of the brain demonstrated no evidence of intra cerebral hemorrhage. The patient's general anesthesia was reversed, and patient was extubated. Initially agitated, the patient gradually settled down to where simple commands appropriately and verbalized appropriately. The patient moved his left lower extremities spontaneously and to command with good strength. Slight movement was evident in the left upper extremity. The patient was then  transferred to the neuro ICU for post revascularization management. IMPRESSION: Status post endovascular revascularization of occluded distal M2 branch of the right middle cerebral artery inferior division with 2 passes with a 3 mm x 20 mm retrieval device and contact aspiration, 1 pass with a 4 mm x 40 mm Solitaire X retrieval device and contact aspiration, 1 pass with contact aspiration with minimal improvement in revascularization of the occluded right MCA inferior division M2 segment occlusion. A TICI 2C revascularization was maintained. PLAN: Follow-up as per referring MD. Electronically Signed   By: Luanne Bras M.D.   On: 05/11/2022 08:28   MR BRAIN WO CONTRAST  Result Date: 05/10/2022 CLINICAL  DATA:  Stroke, follow up EXAM: MRI HEAD WITHOUT CONTRAST MRA HEAD WITHOUT CONTRAST TECHNIQUE: Multiplanar, multi-echo pulse sequences of the brain and surrounding structures were acquired without intravenous contrast. Angiographic images of the Circle of Willis were acquired using MRA technique without intravenous contrast. COMPARISON:  CTA and CT head from same day. FINDINGS: MRI HEAD FINDINGS Motion limited study. Brain: Acute posterior right MCA territory infarct. Associated edema and regional mass effect without significant midline shift. No mass occupying acute hemorrhage. No mass lesion. No hydrocephalus. Remote infarct in the right cerebellum. Vascular: Detailed below. Skull and upper cervical spine: Normal marrow signal. Sinuses/Orbits: Clear sinuses.  No acute orbital findings. MRA HEAD FINDINGS Severely motion limited study. Anterior circulation: Bilateral intracranial ICAs, proximal ACAs and proximal M1 MCAs appear grossly patent. Essentially nondiagnostic evaluation of the M2 MCAs, including the region of occlusion seen on recent CTA. Posterior circulation: The intradural vertebral arteries, basilar arteries and proximal PCAs are grossly patent. IMPRESSION: 1. Acute right posterior MCA territory infarct. Associated edema without midline shift. 2. Severely motion limited MRA with nondiagnostic evaluation of the right M2 MCA in the region of recently seen occlusion on CTA. A repeat exclude exam may be able to better assess if the patient is able in clinically warranted. Electronically Signed   By: Margaretha Sheffield M.D.   On: 05/10/2022 16:03   MR ANGIO HEAD WO CONTRAST  Result Date: 05/10/2022 CLINICAL DATA:  Stroke, follow up EXAM: MRI HEAD WITHOUT CONTRAST MRA HEAD WITHOUT CONTRAST TECHNIQUE: Multiplanar, multi-echo pulse sequences of the brain and surrounding structures were acquired without intravenous contrast. Angiographic images of the Circle of Willis were acquired using MRA technique without  intravenous contrast. COMPARISON:  CTA and CT head from same day. FINDINGS: MRI HEAD FINDINGS Motion limited study. Brain: Acute posterior right MCA territory infarct. Associated edema and regional mass effect without significant midline shift. No mass occupying acute hemorrhage. No mass lesion. No hydrocephalus. Remote infarct in the right cerebellum. Vascular: Detailed below. Skull and upper cervical spine: Normal marrow signal. Sinuses/Orbits: Clear sinuses.  No acute orbital findings. MRA HEAD FINDINGS Severely motion limited study. Anterior circulation: Bilateral intracranial ICAs, proximal ACAs and proximal M1 MCAs appear grossly patent. Essentially nondiagnostic evaluation of the M2 MCAs, including the region of occlusion seen on recent CTA. Posterior circulation: The intradural vertebral arteries, basilar arteries and proximal PCAs are grossly patent. IMPRESSION: 1. Acute right posterior MCA territory infarct. Associated edema without midline shift. 2. Severely motion limited MRA with nondiagnostic evaluation of the right M2 MCA in the region of recently seen occlusion on CTA. A repeat exclude exam may be able to better assess if the patient is able in clinically warranted. Electronically Signed   By: Margaretha Sheffield M.D.   On: 05/10/2022 16:03   ECHOCARDIOGRAM COMPLETE  Result Date: 05/10/2022  ECHOCARDIOGRAM REPORT   Patient Name:   Edward Maynard Date of Exam: 05/10/2022 Medical Rec #:  [id number]     Height:       71.5 in Accession #:    [id number]    Weight:       223.1 lb Date of Birth:  12-12-1933    BSA:          2.220 m Patient Age:    70 years      BP:           160/90 mmHg Patient Gender: M             HR:           73 bpm. Exam Location:  Inpatient Procedure: 2D Echo, Cardiac Doppler and Color Doppler Indications:    Stroke I63.9  History:        Patient has prior history of Echocardiogram examinations and                 Patient has no prior history of Echocardiogram examinations.                  Pacemaker, Stroke, Arrythmias:PVC and Tachycardia; Risk                 Factors:Dyslipidemia.  Sonographer:    Ronny Flurry Referring Phys: [phone] ERIC LINDZEN  Sonographer Comments: Image acquisition challenging due to respiratory motion. IMPRESSIONS  1. Left ventricular ejection fraction, by estimation, is 60 to 65%. The left ventricle has normal function. The left ventricle has no regional wall motion abnormalities. Left ventricular diastolic parameters are consistent with Grade II diastolic dysfunction (pseudonormalization).  2. Right ventricular systolic function is normal. The right ventricular size is normal. There is normal pulmonary artery systolic pressure.  3. The mitral valve is normal in structure. No evidence of mitral valve regurgitation. No evidence of mitral stenosis.  4. The aortic valve is calcified. Aortic valve regurgitation is trivial. Mild aortic valve stenosis. Aortic valve area, by VTI measures 1.55 cm. Aortic valve mean gradient measures 19.0 mmHg. Aortic valve Vmax measures 2.94 m/s.  5. The inferior vena cava is normal in size with greater than 50% respiratory variability, suggesting right atrial pressure of 3 mmHg. FINDINGS  Left Ventricle: Left ventricular ejection fraction, by estimation, is 60 to 65%. The left ventricle has normal function. The left ventricle has no regional wall motion abnormalities. The left ventricular internal cavity size was normal in size. There is  no left ventricular hypertrophy. Left ventricular diastolic parameters are consistent with Grade II diastolic dysfunction (pseudonormalization). Indeterminate filling pressures. Right Ventricle: The right ventricular size is normal. No increase in right ventricular wall thickness. Right ventricular systolic function is normal. There is normal pulmonary artery systolic pressure. The tricuspid regurgitant velocity is 1.45 m/s, and  with an assumed right atrial pressure of 3 mmHg, the estimated right  ventricular systolic pressure is [id number] mmHg. Left Atrium: Left atrial size was normal in size. Right Atrium: Right atrial size was normal in size. Pericardium: There is no evidence of pericardial effusion. Mitral Valve: The mitral valve is normal in structure. No evidence of mitral valve regurgitation. No evidence of mitral valve stenosis. Tricuspid Valve: The tricuspid valve is normal in structure. Tricuspid valve regurgitation is trivial. No evidence of tricuspid stenosis. Aortic Valve: The aortic valve is calcified. Aortic valve regurgitation is trivial. Mild aortic stenosis is present. Aortic valve mean gradient measures 19.0 mmHg. Aortic valve peak gradient measures 34.5 mmHg.  Aortic valve area, by VTI measures 1.55 cm. Pulmonic Valve: The pulmonic valve was normal in structure. Pulmonic valve regurgitation is not visualized. No evidence of pulmonic stenosis. Aorta: The aortic root is normal in size and structure. Venous: The inferior vena cava is normal in size with greater than 50% respiratory variability, suggesting right atrial pressure of 3 mmHg. IAS/Shunts: No atrial level shunt detected by color flow Doppler.  LEFT VENTRICLE PLAX 2D LVIDd:         4.60 cm   Diastology LVIDs:         3.70 cm   LV e' medial:    7.80 cm/s LV PW:         1.30 cm   LV E/e' medial:  10.4 LV IVS:        1.20 cm   LV e' lateral:   11.60 cm/s LVOT diam:     2.20 cm   LV E/e' lateral: 7.0 LV SV:         89 LV SV Index:   40 LVOT Area:     3.80 cm  RIGHT VENTRICLE             IVC RV S prime:     16.60 cm/s  IVC diam: 2.10 cm TAPSE (M-mode): 1.5 cm LEFT ATRIUM             Index        RIGHT ATRIUM           Index LA diam:        4.60 cm 2.07 cm/m   RA Area:     15.10 cm LA Vol (A2C):   64.7 ml 29.14 ml/m  RA Volume:   31.80 ml  14.32 ml/m LA Vol (A4C):   58.7 ml 26.44 ml/m LA Biplane Vol: 62.9 ml 28.33 ml/m  AORTIC VALVE AV Area (Vmax):    1.61 cm AV Area (Vmean):   1.68 cm AV Area (VTI):     1.55 cm AV Vmax:            293.50 cm/s AV Vmean:          189.800 cm/s AV VTI:            0.570 m AV Peak Grad:      34.5 mmHg AV Mean Grad:      19.0 mmHg LVOT Vmax:         124.00 cm/s LVOT Vmean:        83.833 cm/s LVOT VTI:          0.233 m LVOT/AV VTI ratio: 0.41  AORTA Ao Root diam: 3.30 cm MITRAL VALVE               TRICUSPID VALVE MV Area (PHT): 4.68 cm    TR Peak grad:   8.4 mmHg MV Decel Time: 162 msec    TR Vmax:        145.00 cm/s MV E velocity: 81.00 cm/s MV A velocity: 65.50 cm/s  SHUNTS MV E/A ratio:  1.24        Systemic VTI:  0.23 m                            Systemic Diam: 2.20 cm Skeet Latch MD Electronically signed by Skeet Latch MD Signature Date/Time: 05/10/2022/10:48:39 AM    Final    CT HEAD CODE STROKE WO CONTRAST  Result Date: 05/10/2022 CLINICAL DATA:  Acute neurologic deficit  EXAM: CT HEAD WITHOUT CONTRAST CT CERVICAL SPINE WITHOUT CONTRAST TECHNIQUE: Multidetector CT imaging of the head and cervical spine was performed following the standard protocol without intravenous contrast. Multiplanar CT image reconstructions of the cervical spine were also generated. RADIATION DOSE REDUCTION: This exam was performed according to the departmental dose-optimization program which includes automated exposure control, adjustment of the mA and/or kV according to patient size and/or use of iterative reconstruction technique. COMPARISON:  None Available. FINDINGS: CT HEAD FINDINGS Brain: There is no mass, hemorrhage or extra-axial collection. Generalized atrophy. Multiple old small vessel infarcts of the cerebellum. There is hypoattenuation of the periventricular white matter, most commonly indicating chronic ischemic microangiopathy. Old right frontal and parietal infarcts. Vascular: No abnormal hyperdensity of the major intracranial arteries or dural venous sinuses. No intracranial atherosclerosis. Skull: The visualized skull base, calvarium and extracranial soft tissues are normal. Sinuses/Orbits: No fluid levels  or advanced mucosal thickening of the visualized paranasal sinuses. No mastoid or middle ear effusion. The orbits are normal. ASPECTS ([place] Stroke Program Early CT Score) - Ganglionic level infarction (caudate, lentiform nuclei, internal capsule, insula, M1-M3 cortex): 7 - Supraganglionic infarction (M4-M6 cortex): 3 Total score (0-10 with 10 being normal): 10 CT CERVICAL SPINE FINDINGS Alignment: No static subluxation. Facets are aligned. Occipital condyles and the lateral masses of C1 and C2 are normally approximated. Skull base and vertebrae: No acute fracture. Soft tissues and spinal canal: No prevertebral fluid or swelling. No visible canal hematoma. Disc levels: No advanced spinal canal or neural foraminal stenosis. Upper chest: No pneumothorax, pulmonary nodule or pleural effusion. Other: Normal visualized paraspinal cervical soft tissues. IMPRESSION: 1. No acute intracranial abnormality. 2. ASPECTS is 10. 3. No acute fracture or static subluxation of the cervical spine. These results were communicated to Dr. Kerney Elbe at 12:24 am on 05/10/2022 by text page via the [facility] messaging system. Electronically Signed   By: Ulyses Jarred M.D.   On: 05/10/2022 00:47   CT C-SPINE NO CHARGE  Result Date: 05/10/2022 CLINICAL DATA:  Acute neurologic deficit EXAM: CT HEAD WITHOUT CONTRAST CT CERVICAL SPINE WITHOUT CONTRAST TECHNIQUE: Multidetector CT imaging of the head and cervical spine was performed following the standard protocol without intravenous contrast. Multiplanar CT image reconstructions of the cervical spine were also generated. RADIATION DOSE REDUCTION: This exam was performed according to the departmental dose-optimization program which includes automated exposure control, adjustment of the mA and/or kV according to patient size and/or use of iterative reconstruction technique. COMPARISON:  None Available. FINDINGS: CT HEAD FINDINGS Brain: There is no mass, hemorrhage or extra-axial collection.  Generalized atrophy. Multiple old small vessel infarcts of the cerebellum. There is hypoattenuation of the periventricular white matter, most commonly indicating chronic ischemic microangiopathy. Old right frontal and parietal infarcts. Vascular: No abnormal hyperdensity of the major intracranial arteries or dural venous sinuses. No intracranial atherosclerosis. Skull: The visualized skull base, calvarium and extracranial soft tissues are normal. Sinuses/Orbits: No fluid levels or advanced mucosal thickening of the visualized paranasal sinuses. No mastoid or middle ear effusion. The orbits are normal. ASPECTS ([place] Stroke Program Early CT Score) - Ganglionic level infarction (caudate, lentiform nuclei, internal capsule, insula, M1-M3 cortex): 7 - Supraganglionic infarction (M4-M6 cortex): 3 Total score (0-10 with 10 being normal): 10 CT CERVICAL SPINE FINDINGS Alignment: No static subluxation. Facets are aligned. Occipital condyles and the lateral masses of C1 and C2 are normally approximated. Skull base and vertebrae: No acute fracture. Soft tissues and spinal canal: No prevertebral fluid or swelling. No visible  canal hematoma. Disc levels: No advanced spinal canal or neural foraminal stenosis. Upper chest: No pneumothorax, pulmonary nodule or pleural effusion. Other: Normal visualized paraspinal cervical soft tissues. IMPRESSION: 1. No acute intracranial abnormality. 2. ASPECTS is 10. 3. No acute fracture or static subluxation of the cervical spine. These results were communicated to Dr. Kerney Elbe at 12:24 am on 05/10/2022 by text page via the [facility] messaging system. Electronically Signed   By: Ulyses Jarred M.D.   On: 05/10/2022 00:47   CT ANGIO HEAD NECK W WO CM (CODE STROKE)  Result Date: 05/10/2022 CLINICAL DATA:  Acute neurologic deficit EXAM: CT ANGIOGRAPHY HEAD AND NECK TECHNIQUE: Multidetector CT imaging of the head and neck was performed using the standard protocol during bolus administration of  intravenous contrast. Multiplanar CT image reconstructions and MIPs were obtained to evaluate the vascular anatomy. Carotid stenosis measurements (when applicable) are obtained utilizing NASCET criteria, using the distal internal carotid diameter as the denominator. RADIATION DOSE REDUCTION: This exam was performed according to the departmental dose-optimization program which includes automated exposure control, adjustment of the mA and/or kV according to patient size and/or use of iterative reconstruction technique. COMPARISON:  None Available. FINDINGS: CTA NECK FINDINGS SKELETON: There is no bony spinal canal stenosis. No lytic or blastic lesion. OTHER NECK: 12 mm right parotid nodule (series 7 image 81). UPPER CHEST: No pneumothorax or pleural effusion. No nodules or masses. AORTIC ARCH: There is calcific atherosclerosis of the aortic arch. There is no aneurysm, dissection or hemodynamically significant stenosis of the visualized portion of the aorta. Normal variant aortic arch branching pattern with the left vertebral artery arising independently from the aortic arch. The visualized proximal subclavian arteries are widely patent. RIGHT CAROTID SYSTEM: Atherosclerotic web at the bifurcation. Mixed density plaque with less than 50% stenosis. Widely patent ICA. LEFT CAROTID SYSTEM: Calcific atherosclerosis at the bifurcation without hemodynamically significant stenosis. VERTEBRAL ARTERIES: Right dominant configuration. Both origins are clearly patent. There is no dissection, occlusion or flow-limiting stenosis to the skull base (V1-V3 segments). CTA HEAD FINDINGS POSTERIOR CIRCULATION: --Vertebral arteries: Right V4 segment atherosclerosis with widely maintained patency. --Inferior cerebellar arteries: Normal. --Basilar artery: Normal. --Superior cerebellar arteries: Normal. --Posterior cerebral arteries (PCA): Normal. ANTERIOR CIRCULATION: --Intracranial internal carotid arteries: Atherosclerotic calcification of  the internal carotid arteries at the skull base without hemodynamically significant stenosis. --Anterior cerebral arteries (ACA): Normal. Both A1 segments are present. Patent anterior communicating artery (a-comm). --Middle cerebral arteries (MCA): There is occlusion of the proximal M2 segment inferior division of the right MCA (series 10, image 114). Left MCA is normal. VENOUS SINUSES: As permitted by contrast timing, patent. ANATOMIC VARIANTS: Fetal origin of the right posterior cerebral artery. Review of the MIP images confirms the above findings. IMPRESSION: 1. Occlusion of the proximal M2 segment inferior division of the right MCA. 2. 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 3. Nonspecific 12 mm right parotid nodule. 4. Critical Value/emergent results were called by telephone at the time of interpretation on 05/10/2022 at 12:38 am to provider ERIC [facility] , who verbally acknowledged these results. By Electronically Signed   By: Ulyses Jarred M.D.   On: 05/10/2022 00:39    Labs:  Basic Metabolic Panel: No results for input(s): "NA", "K", "CL", "CO2", "GLUCOSE", "BUN", "CREATININE", "CALCIUM", "MG", "PHOS" in the last 168 hours.  CBC: No results for input(s): "WBC", "NEUTROABS", "HGB", "HCT", "MCV", "PLT" in the last 168 hours.  CBG: No results for input(s): "GLUCAP" in the last  168 hours.  Family history.  Mother and father with hypertension.  Father with CVA.  Denies any colon cancer esophageal cancer or rectal cancer  Brief HPI:   Edward Maynard is a 89 y.o. 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  Per chart review lives with spouse.  Two-level home.  Reportedly independent prior to admission.  Wife with  dementia and limited assistance.  Presented 05/10/2022 with acute onset of left-sided weakness resulting in a fall.  Admission chemistries unremarkable except glucose 158 creatinine 1.33, hemoglobin A1c 5.8.  Cranial CT scan negative.  CT cervical spine negative.  CT angiogram of head and neck showed occlusion of the proximal M2 segment inferior division of the right MCA.  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  Nonspecific 15 mm right parotid nodule.  Patient did not receive tPA.  Interventional radiology consulted underwent revascularization of right M2 distal occlusion.  Echocardiogram with ejection fraction of 60 to 65% no wall motion abnormality grade 2 diastolic dysfunction.  Neurology follow-up patient transitioned from Xarelto to Eliquis for CVA prophylaxis/atrial fibrillation.  Currently on a dysphagia #2 thin liquid diet.  Therapy evaluations completed due to patient decreased functional mobility and left-sided weakness was admitted for a comprehensive rehab program.   Hospital Course: TRAMON PICKING was admitted to rehab 05/18/2022 for inpatient therapies to consist of PT, ST and OT at least three hours five days a week. Past admission physiatrist, therapy team and rehab RN have worked together to provide customized collaborative inpatient rehab.  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  Patient neurologically remained stable patient has been transition from Xarelto to Eliquis and would follow-up neurology services.  Hospital course right shoulder pain suspect right rotator cuff strain versus aggravation x-ray showing evidence of chronic RTC and glenohumeral osteoarthritis patient currently not a surgical candidate.  Patient did receive right glenohumeral joint injection with some relief.  His diet had been advanced to  mechanical soft.  Blood pressure is controlled and monitored on Norvasc as well as Toprol.  History of atrial fibrillation cardiac rate controlled maintained on Toprol XL 25 mg daily and would follow-up neurology services.  Hyperlipidemia with Lipitor as indicated.  Findings of parotid mass follow-up outpatient imaging.  CKD stage III creatinine improved 1.26.  Mood stabilization with Remeron as well as Seroquel.   Blood pressures were monitored on TID basis and controlled    Rehab course: During patient's stay in rehab weekly team conferences were held to monitor patient's progress, set goals and discuss barriers to discharge. At admission, patient required min mod assist sit to stand.  Max assist lateral scoot transfers  Physical exam.  Blood pressure 137/98 pulse 80 temperature 98.4 respirations 18 oxygen saturation 96% room air Constitutional.  No acute distress HEENT Head.  Normocephalic and atraumatic Eyes.  Pupils round and reactive to light no discharge without nystagmus Neck.  Supple nontender no JVD without thyromegaly Cardiac irregular irregular with normal rate  Abdomen.  Soft nontender positive bowel sounds without rebound Respiratory effort normal no respiratory distress without wheeze Extremities.  No clubbing cyanosis or edema Neurologic.  Alert oriented follows commands he did  display a right gaze preference tongue was midline Strength 5 out of 5 right upper and lower extremity Left upper extremity shoulder abduction 4 -/5 elbow flexion extension 4/5, finger flexion 2/5 Left lower extremity proximal 4 -/5 distal 4/5   He/She  has had improvement in activity tolerance, balance, postural control as well as ability to compensate for deficits. He/She has had improvement in functional use RUE/LUE  and RLE/LLE as well as improvement in awareness.  Patient with steady overall progress barriers of left hemibody sensory deficits and left inattention.  He is performing supine to sit  using bed features with minimal assist, sit to stand min mod assist for balance min mod assist squat pivot transfers a participating with ambulation 117 feet plus to moderate assist.  Patient completed functional reaching tasks left upper extremity with emphasis on grasp and release.  He did need assist for lower body ADLs.  Speech therapy follow-up noting significant improvement in left side awareness.  Due to limited assistance at home skilled nursing facility was needed and patient discharged to skilled nursing facility.       Disposition: Discharge to skilled nursing facility    Diet: Soft with thin liquids  Special Instructions: No driving smoking or alcohol  Medications at discharge 1.  Tylenol as needed 2.  Norvasc 10 mg p.o. daily 3.  Eliquis 5 mg p.o. twice daily 4.  Lipitor 20 mg p.o. daily 5.  Colace 100 mg p.o. daily 6.  Remeron 15 mg p.o. nightly 7.  Seroquel 12.5 mg p.o. every evening 8.  Tramadol 25 mg p.o. every 6 hours as needed severe pain  30-35 minutes were spent completing discharge summary and discharge planning  Discharge Instructions     Ambulatory referral to Neurology   Complete by: As directed    An appointment is requested in approximately: 4 weeks right MCA infarction        Contact information for follow-up providers     Kirsteins, Luanna Salk, MD Follow up.   Specialty: Physical Medicine and Rehabilitation Why: Office to call for appointment Contact information: [place] [address] [phone]         Thompson Grayer, MD Follow up.   Specialty: Cardiology Why: Call for appointment             Contact information for after-discharge care     Destination     [facility] .   Service: Skilled Nursing Contact information: [address] Test Update Address [place] [phone]                     Signed: Cathlyn Parsons 06/07/2022, 4:03  PM

## 2022-06-07 NOTE — NC FL2 (Signed)
[place] LEVEL OF CARE FORM     IDENTIFICATION  Patient Name: Edward Maynard Birthdate: [birth year] Sex: male Admission Date (Current Location): 05/18/2022  [facility] and [place] Number:  Herbalist and Address:  [facility] [place], [place] [address], [place], [place] [address]      Provider Number:    Attending Physician Name and Address:  Charlett Blake, MD  Relative Name and Phone Number:  Isaias Cowman (daughter) [phone]    Current Level of Care: Hospital Recommended Level of Care: [place] Prior Approval Number: [id number]  Date Approved/Denied: 06/05/22 PASRR Number: [id number]  Discharge Plan: SNF    Current Diagnoses: Patient Active Problem List   Diagnosis Date Noted   CKD (chronic kidney disease) 05/18/2022   Insomnia 05/18/2022   Right middle cerebral artery stroke ([place]) 05/18/2022   Essential hypertension 05/16/2022   Obesity 05/16/2022   Advanced age 64/27/2024   Dysphagia 05/16/2022   Stroke (cerebrum) ([place]) 05/10/2022   Middle cerebral artery embolism, 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   Chronic a-fib ([place]) 05/10/2022   Paroxysmal atrial fibrillation ([place]) 01/18/2015   Chronic anticoagulation 01/18/2015   PVC's (premature ventricular contractions) 12/14/2014   NSVT (nonsustained ventricular tachycardia) ([place]) 12/14/2014   Cerebral infarction due to embolism of cerebral artery ([place]) 07/10/2014   Hyperlipidemia 12/12/2013   Personal history of colonic polyp - adenoma  10/23/2013    Orientation RESPIRATION BLADDER Height & Weight     Self, Place, Situation  Normal Incontinent Weight: 206 lb 5.6 oz (93.6 kg) Height:  5' 11.5" (181.6 cm)  BEHAVIORAL SYMPTOMS/MOOD NEUROLOGICAL BOWEL NUTRITION STATUS      Incontinent Diet (d3 Thin)  AMBULATORY STATUS COMMUNICATION OF NEEDS Skin   Limited Assist Verbally Normal                       Personal Care Assistance Level of Assistance  Bathing, Feeding,  Dressing Bathing Assistance: Limited assistance Feeding assistance: Limited assistance Dressing Assistance: Limited assistance     Functional Limitations Info  Sight Sight Info: Impaired        SPECIAL CARE FACTORS FREQUENCY  PT (By licensed PT), OT (By licensed OT), Speech therapy     PT Frequency: 5x / week OT Frequency: 5x/ week     Speech Therapy Frequency: 5x/week      Contractures Contractures Info: Not present    Additional Factors Info  Code Status Code Status Info: full             Current Medications (06/07/2022):  This is the current hospital active medication list Current Facility-Administered Medications  Medication Dose Route Frequency Provider Last Rate Last Admin   acetaminophen (TYLENOL) tablet 650 mg  650 mg Oral Q4H PRN Cathlyn Parsons, PA-C   650 mg at 06/03/22 [id number]   Or   acetaminophen (TYLENOL) 160 MG/5ML solution 650 mg  650 mg Per Tube Q4H PRN Angiulli, Lavon Paganini, PA-C       Or   acetaminophen (TYLENOL) suppository 650 mg  650 mg Rectal Q4H PRN Cathlyn Parsons, PA-C   650 mg at 05/28/22 2030   amLODipine (NORVASC) tablet 10 mg  10 mg Oral Daily Cathlyn Parsons, PA-C   10 mg at 06/07/22 [id number]   apixaban (ELIQUIS) tablet 5 mg  5 mg Oral BID Cathlyn Parsons, PA-C   5 mg at 06/07/22 [id number]   atorvastatin (LIPITOR) tablet 20 mg  20 mg Oral Daily Lauraine Rinne  J, PA-C   20 mg at 06/07/22 [id number]   bisacodyl (DULCOLAX) suppository 10 mg  10 mg Rectal Daily PRN Cathlyn Parsons, PA-C   10 mg at 05/30/22 1625   camphor-menthol (SARNA) lotion   Topical PRN Cathlyn Parsons, PA-C   Given at 05/30/22 0302   capsaicin (ZOSTRIX) 0.025 % cream   Topical BID Charlett Blake, MD   Given at 06/07/22 [id number]   docusate sodium (COLACE) capsule 100 mg  100 mg Oral Daily Cathlyn Parsons, PA-C   100 mg at 06/07/22 [id number]   feeding supplement (ENSURE ENLIVE / ENSURE PLUS) liquid 237 mL  237 mL Oral TID BM Cathlyn Parsons, PA-C   237 mL at 06/06/22 2222    hydrOXYzine (ATARAX) tablet 10 mg  10 mg Oral Q6H PRN Charlett Blake, MD   10 mg at 06/06/22 2222   metoprolol succinate (TOPROL-XL) 24 hr tablet 25 mg  25 mg Oral Daily Cathlyn Parsons, PA-C   25 mg at 06/07/22 [id number]   mirtazapine (REMERON SOL-TAB) disintegrating tablet 15 mg  15 mg Oral QHS Charlett Blake, MD   15 mg at 06/06/22 2221   QUEtiapine (SEROQUEL) tablet 12.5 mg  12.5 mg Oral QPM Alger Simons T, MD   12.5 mg at 06/06/22 2315   senna-docusate (Senokot-S) tablet 1 tablet  1 tablet Oral QHS PRN Cathlyn Parsons, PA-C   1 tablet at 05/25/22 2058   traMADol (ULTRAM) tablet 25 mg  25 mg Oral Q6H PRN [address], [place], [place]   25 mg at 06/05/22 2024     Discharge Medications: Please see discharge summary for a list of discharge medications.  Relevant Imaging Results:  Relevant Lab Results:   Additional Information [id number]  Dyanne Iha

## 2022-06-07 NOTE — Plan of Care (Signed)
Patient AOX4, weaker on right leg and arm compared to left.  VSS throughout shift.  Pt voided in urinal.  All meds given on time as ordered.  Pt denied pain and SOB.  Diminished lungs, IS encouraged.  POC maintained, will continue to monitor.  Problem: Consults Goal: RH STROKE PATIENT EDUCATION Description: See Patient Education module for education specifics  Outcome: Progressing   Problem: RH BOWEL ELIMINATION Goal: RH STG MANAGE BOWEL WITH ASSISTANCE Description: STG Manage Bowel with  toileting Assistance. Outcome: Progressing Goal: RH STG MANAGE BOWEL W/MEDICATION W/ASSISTANCE Description: STG Manage Bowel with Medication with mod I Assistance. Outcome: Progressing   Problem: RH BLADDER ELIMINATION Goal: RH STG MANAGE BLADDER WITH ASSISTANCE Description: STG Manage Bladder With toileting Assistance Outcome: Progressing   Problem: RH COGNITION-NURSING Goal: RH STG USES MEMORY AIDS/STRATEGIES W/ASSIST TO PROBLEM SOLVE Description: STG Uses Memory Aids/Strategies With cues  Assistance to Problem Solve. Outcome: Progressing   Problem: RH KNOWLEDGE DEFICIT Goal: RH STG INCREASE KNOWLEDGE OF DIABETES Description: [name]/SIL and caregiver will be able to manage DM/prediabetes with dietary modification using educational resources independently Outcome: Progressing Goal: RH STG INCREASE KNOWLEDGE OF HYPERTENSION Description: [name]/SIL and caregiver will be able to manage HTN with medications and dietary modification using educational resources independently Outcome: Progressing Goal: RH STG INCREASE KNOWLEDGE OF DYSPHAGIA/FLUID INTAKE Description: [name]/SIL and caregiver will be able to manage Dysphagia, medications, and dietary modification using educational resources independently Outcome: Progressing Goal: RH STG INCREASE KNOWLEGDE OF HYPERLIPIDEMIA Description: [name]/SIL and caregiver will be able to manage HLD with medications  and dietary modification using educational resources independently Outcome: Progressing   Problem: RH KNOWLEDGE DEFICIT Goal: RH STG INCREASE KNOWLEDGE OF STROKE PROPHYLAXIS Description: [name]/SIL and caregiver will be able to manage secondary risks with medications and dietary modification using educational resources independently Outcome: Progressing   Problem: Education: Goal: Understanding of CV disease, CV risk reduction, and recovery process will improve Outcome: Progressing Goal: Individualized Educational Video(s) Outcome: Progressing   Problem: Activity: Goal: Ability to return to baseline activity level will improve Outcome: Progressing   Problem: Cardiovascular: Goal: Ability to achieve and maintain adequate cardiovascular perfusion will improve Outcome: Progressing Goal: Vascular access site(s) Level 0-1 will be maintained Outcome: Progressing   Problem: Health Behavior/Discharge Planning: Goal: Ability to safely manage health-related needs after discharge will improve Outcome: Progressing   Problem: Education: Goal: Knowledge of disease or condition will improve Outcome: Progressing Goal: Knowledge of secondary prevention will improve (MUST DOCUMENT ALL) Outcome: Progressing Goal: Knowledge of patient specific risk factors will improve [place] N/A or DELETE if not current risk factor) Outcome: Progressing   Problem: Ischemic Stroke/TIA Tissue Perfusion: Goal: Complications of ischemic stroke/TIA will be minimized Outcome: Progressing   Problem: Coping: Goal: Will verbalize positive feelings about self Outcome: Progressing Goal: Will identify appropriate support needs Outcome: Progressing   Problem: Health Behavior/Discharge Planning: Goal: Ability to manage health-related needs will improve Outcome: Progressing Goal: Goals will be collaboratively established with patient/family Outcome: Progressing   Problem: Self-Care: Goal: Ability to  participate in self-care as condition permits will improve Outcome: Progressing Goal: Verbalization of feelings and concerns over difficulty with self-care will improve Outcome: Progressing Goal: Ability to communicate needs accurately will improve Outcome: Progressing   Problem: Nutrition: Goal: Risk of aspiration will decrease Outcome: Progressing Goal: Dietary intake will improve Outcome: Progressing

## 2022-06-07 NOTE — Progress Notes (Signed)
Occupational Therapy Session Note  Patient Details  Name: FAUST DOCKSTADER MRN: [id number] Date of Birth: June 28, 1933  Today's Date: 06/07/2022 OT Individual Time: 0803-0900 OT Individual Time Calculation (min): 57 min  OT Individual Time: [id number] OT Individual Time Calculation (min): 33 min   Short Term Goals: Week 3:  OT Short Term Goal 1 (Week 3): STG=LTG d/t Pt ELOS  Skilled Therapeutic Interventions/Progress Updates:     AM Session:  Pt received resting in bed presenting to be in good spirits and reporting 0/10 pain this session. Pt receptive to taking shower this AM for BADL retraining. Pt completed squat pivot EOB >wc mod A. Transported total A to bathroom in wc for time management. Educated Pt on TTB transfer going to L weaker side with Pt able to complete at heavy mod A level with LUE supported in gait belt to prevent injury and LLE blocked by OT. Pt able to maintain dynamic sitting balance while on tub bench with CGA. Provided Pt with wash mit on LUE to facilitate increased use of LUE during bathing tasks with Pt utilizing to wash face, chest, and RUE. Pt completed remainder of UB bathing with min A +time and max cueing for attention. Facilitated Pt using LUE to wash upper legs and anterior peri area with OT washing lower portions of legs and buttocks with LB bathing completed at overall mod A level. Pt completed squat pivot back to wc mod A transported to room wc>EOB mod A. Dressing tasks completed seated EOB with max cueing provided to don shirt mod A and pants max A. Pt participating more in dressing tasks as noted by leaning forward to weave feet into pants and bring pants over knees once feet are weave. Pt requiring VB cues to point toes towards floor vs. flex feet d/t feet getting stuck in pants. Pt completed sit>stand to bring pants to waist maintaining standing balance with heavy min A with LLE blocked and RUE supported on bed rail. Pt returned to bed at end of session min A and was  left resting in bed with call bell in reach, bed alarm on, and all needs met.   PM Session:  Pt received semi reclined in bed with pants halfway down bottom with Pt reporting he had recently gone to bathroom and his pants were not fully pulled up following. Facilitated Pt completing bridges in bed x2 to assist with LB dressing with Pt able to lift hips with feet stabilized by OT. Pt transitioned to EOB with mod A using bed features with cueing required for technique. Pt completed squat pivot to R side with mod A with OT facilitating increased weight bearing through BLEs during transfer to increased L LLE strength. Pt completed FAST-UL to measure progress in Pt LUE functioning. Assessment completed as followed:   FAST-UL Outcome Measure  Hand-to-mouth (HtM) Movement Starting Position: Participant seated on a standard chair without armrests. Trunk leaning on back support of chair. Both hands placed in pronated position on the ipsilateral middle thigh. Feet placed flat on the floor. If participants have any difficulty in understanding instructions (i.e. aphasia) a visual demonstration is suggested. For each of the 5 tasks of the FAST-UL, the subject at first performs the movement with the less affected UL and then with the affected one. The movement can be repeated 3 times and the best score of the three attempts is assigned.   Instructions: Each subject is asked to move the hand towards the mouth, touch it with fingertips and return  to the thigh. Motor task occurs without moving the trunk off the back support and without moving the head toward the hand.   Scoring: Clinical score from 0 to 3 is provided by comparing affected side with less affected one as follows: 0 = no movement at all. 1 = The movement task is not completed (less of 50% of the contralateral HtM movement). 2 = The movement task is not completed (more of 50% of the contralateral HtM movement but the mouth is not reached) or the  movement task is completed with compensations. If the mouth is touched with the wrist or the palm or the movement is performed with head or trunk compensations (flexion of the head and trunk towards the hand) the score is 2.   3 = movement carried out at 100% of the contralateral HtM movement. HtM occurs with adequate shoulder flexion and abduction, elbow flexion, and forearm supination. The mouth is touched with fingertips.  Patient Score: 2   Reach to Target (RtT) Movement Starting Position: Same starting conditions of HtM movement. Instructions: Each subject is asked to move the hand toward a target (i.e. the hand of the examiner) located in front of the subject in the ipsilateral workspace at shoulder height, at a distance corresponding to 100% of the fully extended UL within arm's reach (less affected arm as reference). Participants have to reach, touch the target, and return. Motor task occurs without moving the trunk off the back support. Scoring: Clinical score from 0 to 3 is provided by comparing affected side with less affected one as follows: 0 = no movement at all. 1 = The movement task is not completed (less of 50% of the contralateral RtT movement).  2 = The movement task is not completed (more of 50% of the contralateral RtT movement but the target is not reached) or the movement task is completed with compensations (i.e. the trunk loses contact with the back support of the chair with forward displacement, shoulder flexion occurs with excessive scapular elevation, or shoulder excessive abduction). If the target is reached with trunk or shoulder compensations for inadequate elbow and finger extension the score is 2.  3 = movement performed at 100% of the contralateral RtT. The target is reached with adequate shoulder flexion, elbow, wrist and finger extension.  Patient Score: 2   Prono-supination (PS) Movement Starting Position: Same starting conditions of HtM  movement. Instructions: Motor task occurs without moving the trunk anteriorly or laterally, the medial side of the humerus is against the body, the forearm is fully pronated with the hand resting on the thigh. Scoring: Clinical score from 0 to 3 is provided by comparing paretic side with less affected one as follows: 0 = no movement at all. 1 = The movement task is not completed (less of 50% of the contralateral PS movement).  2 = The movement task is not completed (more of 50% of the contralateral PS movement but the forearm is not fully supinated) or the movement task is completed with compensations (i.e. excessive trunk inclination, shoulder abduction). If the movement is completed with compensations at elbow, shoulder or trunk level the score is 2. 3 = movement performed at 100% of the contralateral PS (complete supination of the forearm with the dorsal part of the hand in contact with the thigh).   Patient Score: 2   Grasp and Release (GaR) Movement Starting position: Participant seated on a standard chair. Hip and knees in 90 flexion, feet flat on the floor. Upper  limb (UL) resting on a table in front of the participant with approximately 90 elbow flexion, forearm pronated and fingers in a relaxed extended and adducted position.  Instructions: The subject performs a grasping movement of a cylindrical rigid glass (at least 6 cm diameter) placed proximally to an imaginary line connecting the distal joints of thumb and index finger. The subject is asked to grasp the glass, lift it at least 2 cm (elbow remains in contact with the table), and release it. Scoring:  Clinical score from 0 to 3 is provided by comparing affected side with less affected one as follows: 0 = No movement. The grasp is not possible. 1 = The movement task is not completed (less of 50% of the task). Some prehension is possible but the grasp is not sufficiently stable to lift the object; the grasp can be performed with the  use of the less affected hand only to stabilize the glass for inadequate hand/finger opening and the release is not possible. Some hand opening is required otherwise the score is 0. 2 = The movement task is not completed (more of 50% of the task). The object is grasped and lifted but it falls or the task is completed using alternative grasping strategies (i.e. multi-pulpar, palmar, digito palmar; grasping and releasing of the object is possible with abnormal orientation of the wrist and fingers toward the object and the forearm is lifted off the table). 3 = The task is completed using the expected pattern (normal orientation of fingers or wrist toward the object, the grasp occurs with thumb and fingers in opposition, forearm supination, elbow flexion; thumb abduction and finger extension to release the object).  Patient Score: 2   Pinch and Release (PaR) Movement Starting position: Same starting conditions of GaR movement The participant performs a PaR movement of a pen placed on a table in the midline of an imaginary line connecting the distal joints of thumb and index finger. The participants asked to pinch the pen with the tips of thumb and index finger, lift it at least 2 cm (elbow remains in contact with the table), and release it. Clinical score from 0 to 3 is provided by comparing affected side with non-affected one as follows: 0 = No movement. The pinch is not possible. 1 = The movement task is not completed (less of 50% of the task). Some prehension is possible but the pinch is not sufficiently stable to lift the object; the pinch occurs with the use of the less affected hand to stabilize the object for inadequate finger opening and the release is not possible. Some fingers movement is required otherwise the score is 0. 2 = The movement task is not completed (more of 50% of the task). The object is pinched and lifted but it falls or the task is completed using alternative pinching strategies  (e.g. pinching with all the fingers, tripod pinch, pinching and releasing of the object is possible with abnormal orientation of fingers and wrist toward the object and the forearm is lifted off the table). 3 = The task is completed using the expected pattern (normal orientation of fingers or wrist toward the object, the pinch occurs with opposition of pads of index finger and thumb, and wrist extension).  Patient Score: 2  Total score: 10  Pt previously scored 8 on FAST-UL during initial evaluation demonstrating improvement in LUE functioning.   Educated Pt on hemi strategy for wc mobility to increase Pt independence in functional mobility as pt is likely  d/c'ing at wc level. Pt able to propel wc ~18ft x2 trials using R U/LEs with min A to navigate around as Pt first attempt with task. Pt completed squat pivot back to bed mod A and was left resting in bed with call bell in reach, bed alarm on, and all needs met.   Therapy Documentation Precautions:  Precautions Precautions: Fall Precaution Comments: Left hemi; Left homonymous hemianopsia Restrictions Weight Bearing Restrictions: No General:   Vital Signs: Therapy Vitals Temp: 97.9 F (36.6 C) Temp Source: Oral Pulse Rate: 79 Resp: 18 BP: 112/79 Oxygen Therapy SpO2: 96 % O2 Device: Room Air   Therapy/Group: Individual Therapy  Janey Genta 06/07/2022, 7:57 AM

## 2022-06-07 NOTE — Discharge Summary (Signed)
Physical Therapy Discharge Summary  Patient Details  Name: Edward Maynard MRN: [id number] Date of Birth: [birth year]  Date of Discharge from PT service:{Time; dates multiple:304500300}  {CHL IP REHAB PT TIME CALCULATION:304800500}   Patient has met {NUMBERS 0-12:18577} of {NUMBERS 0-12:18577} long term goals due to {due ZH:2992426}.  Patient to discharge at [facility] level {LOA:3049010}.   Patient's care partner {care partner:3041650} to provide the necessary {assistance:3041652} assistance at discharge.  Reasons goals not met: ***  Recommendation:  Patient will benefit from ongoing skilled PT services in {setting:3041680} to continue to advance safe functional mobility, address ongoing impairments in ***, and minimize fall risk.  Equipment: {equipment:3041657}  Reasons for discharge: {Reason for discharge:3049018}  Patient/family agrees with progress made and goals achieved: {Pt/Family agree with progress/goals:3049020}  Skilled Therapeutic Interventions/Progress Updates:  Pt ***  PT Discharge Precautions/Restrictions Precautions Precautions: Fall;Other (comment) Precaution Comments: Left hemi; Left homonymous hemianopsia; absent/severely impaired L hemi sensation Restrictions Weight Bearing Restrictions: No Pain Pain Assessment Pain Scale: 0-10 Pain Score: 0-No pain Pain Interference Pain Interference Pain Effect on Sleep: 2. Occasionally (pt reports difficult time sleeping in general) Pain Interference with Therapy Activities: 2. Occasionally Pain Interference with Day-to-Day Activities: 2. Occasionally Vision/Perception  Vision - History Ability to See in Adequate Light: 0 Adequate Perception Perception: Impaired Inattention/Neglect: Does not attend to left visual field;Does not attend to left side of body (although improved since initial eval) Spatial Orientation: impaired midline orientation although improved since initial evaluation Praxis Praxis:  Impaired Praxis Impairment Details: Motor planning;Initiation;Ideomotor;Perseveration  Cognition Overall Cognitive Status: History of cognitive impairments - at baseline (age related changes per DTR report) Arousal/Alertness: Awake/alert Orientation Level: Oriented X4 Year: 2024 Month: March Day of Week: Incorrect (pt thought it was Thursday) Attention: Focused;Sustained Focused Attention: Appears intact Sustained Attention: Appears intact Selective Attention: Impaired Memory: Impaired Awareness: Impaired Problem Solving: Impaired Safety/Judgment: Impaired Sensation Sensation Light Touch: Impaired Detail Light Touch Impaired Details: Impaired LUE;Impaired LLE Hot/Cold: Not tested Proprioception: Impaired Detail Proprioception Impaired Details: Impaired LLE;Impaired LUE Stereognosis: Not tested Coordination Gross Motor Movements are Fluid and Coordinated: No Coordination and Movement Description: impaired due to L hemiparesis, impaired/absent L hemibody sensation, and impaired spatial orientation although improved since initial eval Motor     Mobility Bed Mobility Bed Mobility: Sit to Supine;Supine to Sit (to R EOB using bed features) Supine to Sit: Minimal Assistance - Patient > 75%;Moderate Assistance - Patient 50-74% Sit to Supine: Minimal Assistance - Patient > 75%;Moderate Assistance - Patient 50-74% Transfers Transfers: Sit to Stand;Stand to Sit;Squat Pivot Transfers Sit to Stand: Moderate Assistance - Patient 50-74%;Minimal Assistance - Patient > 75% Stand to Sit: Minimal Assistance - Patient > 75%;Moderate Assistance - Patient 50-74% Squat Pivot Transfers: Minimal Assistance - Patient > 75%;Moderate Assistance - Patient 50-74% Transfer (Assistive device): None Locomotion  Gait Ambulation: Yes Gait Assistance: 2 Helpers;Moderate Assistance - Patient 50-74% (+2 R HHA providing min assist and therapist providing mod assist on L) Gait Distance (Feet): 117  Feet Assistive device: 2 person hand held assist;Other (Comment) (requires L LE leg lifter to facilitate swing and L LE ankle ASO (soft ankle brace) to protect his ankle at risk for inversion rolling) Gait Assistance Details: Verbal cues for technique;Verbal cues for sequencing;Manual facilitation for placement;Manual facilitation for weight shifting;Tactile cues for weight shifting;Tactile cues for sequencing;Verbal cues for gait pattern Gait Gait: Yes Gait Pattern: Impaired Gait Pattern: Decreased stance time - left;Narrow base of support (L LE continues to have flexor synergy pattern during swing advancement requiring mod/max  assist to place it for safety to avoid excessive adduction, providing mod assist ~80% of the time to block/guard L knee during stance due to excessive knee flexion) Gait velocity: decreased Stairs / Additional Locomotion Stairs: No Wheelchair Mobility Wheelchair Mobility: Yes Wheelchair Assistance: Development worker, international aid: Right upper extremity;Right lower extremity Wheelchair Parts Management: Needs assistance Distance: 180ft  Trunk/Postural Assessment     Balance   Extremity Assessment      RLE Assessment RLE Assessment: Within Functional Limits Active Range of Motion (AROM) Comments: WFL/WNL General Strength Comments: Grossly 4+/5 to 5/5 LLE Assessment LLE Assessment: Exceptions to [facility] Active Range of Motion (AROM) Comments: WFL General Strength Comments: assessed in sitting - uncoordinated movement due to L sensory deficits also making it difficult to sustain contraction due to lack of the feedback LLE Strength Left Hip Flexion: 3+/5 Left Knee Flexion: 4-/5 Left Knee Extension: 4/5 Left Ankle Dorsiflexion: 4-/5 Left Ankle Plantar Flexion: 4-/5   Grier Czerwinski M Breezie Micucci , PT, DPT, NCS, CSRS 06/07/2022, 11:25 AM

## 2022-06-07 NOTE — Progress Notes (Signed)
Physical Therapy Weekly Progress Note  Patient Details  Name: Edward Maynard MRN: [id number] Date of Birth: 24-Sep-1933  Beginning of progress report period: May 30, 2022 End of progress report period: June 07, 2022  Today's Date: 06/07/2022 PT Individual Time: 1110-1205 PT Individual Time Calculation (min): 55 min   Patient has met 0 of 4 short term goals.  Mr. Volner is continuing to make steady progress with therapy having greatest barrier of severe L hemibody sensory deficits and L inattention resulting in impaired motor planning and in coordination. He is performing supine<>sit using bed features min assist, sit<>stands min/mod assist for balance due to L lean, min/mod assist squat pivot transfers, and participating in gait training up to ~177ft using +2 mod assist as described below. Pt's family initiated education with hands-on training this past Saturday 06/03/22 and they determined patient will require additional therapy to further increase pt's independence with functional mobility prior to D/Cing home with family support. Pt will benefit from continued CIR level skilled physical therapy to further increase his independence with functional mobility.  Patient continues to demonstrate the following deficits muscle weakness and muscle paralysis, decreased cardiorespiratoy endurance, impaired timing and sequencing, abnormal tone, unbalanced muscle activation, motor apraxia, decreased coordination, and decreased motor planning, decreased visual perceptual skills and decreased visual motor skills, decreased midline orientation and decreased attention to left, decreased initiation, decreased attention, decreased awareness, decreased problem solving, decreased safety awareness, decreased memory, and delayed processing, and decreased sitting balance, decreased standing balance, decreased postural control, and decreased balance strategies and therefore will continue to benefit from skilled PT  intervention to increase functional independence with mobility.  Patient not progressing toward long term goals.  See goal revision..  Plan of care revisions: Patient now planning for D/C to SNF.  PT Short Term Goals Week 2:  PT Short Term Goal 1 (Week 2): Pt will perform bed mobility with min assist PT Short Term Goal 1 - Progress (Week 2): Progressing toward goal PT Short Term Goal 2 (Week 2): Pt will perform bed<>chair transfers using LRAD with min assist PT Short Term Goal 2 - Progress (Week 2): Progressing toward goal PT Short Term Goal 3 (Week 2): Pt will perform sit<>stand transfers min assist using LRAD PT Short Term Goal 3 - Progress (Week 2): Progressing toward goal PT Short Term Goal 4 (Week 2): Pt will ambulate at least 57ft using LRAD with mod assist of 1 and +2 for safety PT Short Term Goal 4 - Progress (Week 2): Progressing toward goal Week 3:  PT Short Term Goal 1 (Week 3): STG = LTG based on ELOS  Skilled Therapeutic Interventions/Progress Updates:    Pt received supine in bed awake and eager to participate in therapy session. Supine>sitting R EOB, HOB elevated, with heavy min assist for trunk upright and max verbal/visual cuing for pt to increase attention to L LE for motor planning sequencing of the task. Sitting EOB donned shoes and TED hose total assist for time management. Sit>stand EOB>R UE support on bedrail as needed with min assist for balance while coming to stand with pt demoing improved midline orientation - therapist pulled pants up over hips dependently. L squat pivot EOB>W/C with pt demoing improved motor planning and sequencing of the task with improving hip clearance - continues to benefit from mod verbal/visual cuing for sequencing and min assist for pivoting hips into seat.  Transported to/from gym in w/c for time management and energy conservation.  Sit>stand from w/c with min/mod assist  for balance depending on pt's attention to task and fatigue - will often  have stronger L lateral lean with lack of sustained L knee extension due to impaired sensation. Stand>sit requires mod assist for balance at end of gait trials due to pt having strong L posterior hip weight shift when going to sit requiring cuing for R weight shift and bringing R hip towards R armrest.  Donned L LE ankle ASO and leg lifter (to be used for swing phase placement) for gait training.  Gait training 122ft + 32ft using R HHA from +2 and L UE over therapist with mod assist from therapist and +2 min A from R HHA. Pt demonstrating the following gait deviations with therapist providing the described cuing and facilitation for improvement:  - improved L knee extension during stance requiring mod assist to block/guard for safety ~80% of the time - improved L LE foot placement during swing although still in flexor synergy pattern with excessive adduction but improving - continues to have excessive L lateral trunk lean although improving - achieves reciprocal stepping pattern consistently with improved upright trunk posture   At end of session, pt left seated in w/c with needs in reach, seat belt alarm on, and L UE supported on 1/2 lap tray with pillows.    Therapy Documentation Precautions:  Precautions Precautions: Fall Precaution Comments: Left hemi; Left homonymous hemianopsia Restrictions Weight Bearing Restrictions: No   Pain:  Denies pain during session.   Therapy/Group: Individual Therapy  Tawana Scale , PT, DPT, NCS, CSRS 06/07/2022, 7:56 AM

## 2022-06-07 NOTE — Progress Notes (Signed)
Occupational Therapy Discharge Summary  Patient Details  Name: Edward Maynard MRN: [id number] Date of Birth: 1934/02/06  Date of Discharge from OT service:June 07, 2022  {CHL IP REHAB OT TIME CALCULATIONS:304400400}   Patient has met {NUMBERS 0-12:18577} of {NUMBERS 0-12:18577} long term goals due to {due QV:8476303.  Patient to discharge at overall {LOA:3049010} level.  Patient's care partner {care partner:3041650} to provide the necessary {assistance:3041652} assistance at discharge.    Reasons goals not met: ***  Recommendation:  Patient will benefit from ongoing skilled OT services in {setting:3041680} to continue to advance functional skills in the area of {ADL/iADL:3041649}.  Equipment: {equipment:3041657}  Reasons for discharge: {Reason for discharge:3049018}  Patient/family agrees with progress made and goals achieved: {Pt/Family agree with progress/goals:3049020}  OT Discharge Precautions/Restrictions    General   Vital Signs   Pain   ADL ADL Eating: Minimal assistance Where Assessed-Eating: Bed level Grooming: Moderate assistance Where Assessed-Grooming: Edge of bed Upper Body Bathing: Minimal assistance Where Assessed-Upper Body Bathing: Shower Lower Body Bathing: Minimal assistance, Moderate assistance Where Assessed-Lower Body Bathing: Shower Upper Body Dressing: Moderate assistance Where Assessed-Upper Body Dressing: Edge of bed Lower Body Dressing: Maximal assistance Where Assessed-Lower Body Dressing: Edge of bed Toileting: Maximal assistance Where Assessed-Toileting: Bedside Commode Toilet Transfer: Moderate assistance Toilet Transfer Method: Squat pivot Toilet Transfer Equipment: Radiographer, therapeutic: Not assessed Social research officer, government: Moderate assistance Social research officer, government Method: Education officer, environmental: Gaffer Baseline Vision/History: 0 No visual deficits Vision Assessment?:  Yes Eye Alignment: Within Functional Limits Ocular Range of Motion: Restricted on the left Alignment/Gaze Preference: Head turned;Gaze right Tracking/Visual Pursuits: Requires cues, head turns, or add eye shifts to track;Impaired - to be further tested in functional context;Left eye does not track laterally Saccades: Decreased speed of saccadic movement Convergence: Impaired (comment) Visual Fields: Left homonymous hemianopsia;Left visual field deficit Depth Perception: Overshoots;Undershoots Perception  Perception: Impaired Inattention/Neglect: Does not attend to left visual field;Does not attend to left side of body Spatial Orientation: impaired midline orientation although improved since initial evaluation Praxis Praxis: Impaired Praxis Impairment Details: Motor planning;Initiation;Ideomotor;Perseveration Cognition Cognition Overall Cognitive Status: History of cognitive impairments - at baseline (age related changes per DTR report) Arousal/Alertness: Awake/alert Orientation Level: Person;Place;Situation Person: Oriented Place: Oriented Situation: Oriented Memory: Impaired Memory Impairment: Decreased short term memory;Storage deficit Decreased Short Term Memory: Verbal basic;Functional basic Attention: Focused;Sustained Focused Attention: Appears intact Sustained Attention: Appears intact Selective Attention: Impaired Selective Attention Impairment: Functional basic Awareness: Impaired Awareness Impairment: Emergent impairment Problem Solving: Impaired Problem Solving Impairment: Verbal complex;Functional basic Executive Function: Self Monitoring Self Monitoring: Impaired Self Monitoring Impairment: Functional basic Safety/Judgment: Impaired Brief Interview for Mental Status (BIMS) Repetition of Three Words (First Attempt): 3 Temporal Orientation: Year: Correct Temporal Orientation: Month: Accurate within 5 days Temporal Orientation: Day: Correct Recall: "Sock": Yes,  no cue required Recall: "Blue": Yes, no cue required Recall: "Bed": Yes, no cue required BIMS Summary Score: 15 Sensation Sensation Light Touch: Impaired Detail Light Touch Impaired Details: Impaired LUE;Impaired LLE Hot/Cold: Not tested Proprioception: Impaired Detail Proprioception Impaired Details: Impaired LLE;Impaired LUE Stereognosis: Not tested Coordination Gross Motor Movements are Fluid and Coordinated: No Fine Motor Movements are Fluid and Coordinated: No Coordination and Movement Description: impaired due to L hemiparesis, impaired/absent L hemibody sensation, and impaired spatial orientation although improved since initial eval Finger Nose Finger Test: Improvement from baseline, decreased coordination with dysmetria and ataxia present Motor  Motor Motor: Hemiplegia;Motor apraxia;Ataxia Motor - Discharge Observations: Significant improvement from baseline, however  Pt continues to present with hemiplegia, ataxia, and motor apraxia Mobility  Bed Mobility Bed Mobility: Sit to Supine;Supine to Sit Supine to Sit: Minimal Assistance - Patient > 75%;Moderate Assistance - Patient 50-74% Sit to Supine: Minimal Assistance - Patient > 75%;Moderate Assistance - Patient 50-74% Transfers Sit to Stand: Moderate Assistance - Patient 50-74%;Minimal Assistance - Patient > 75% Stand to Sit: Minimal Assistance - Patient > 75%;Moderate Assistance - Patient 50-74%  Trunk/Postural Assessment  Cervical Assessment Cervical Assessment: Exceptions to [facility] (forward head) Thoracic Assessment Thoracic Assessment: Exceptions to [facility] (rounded shoudlers) Lumbar Assessment Lumbar Assessment: Exceptions to [facility] (posterior pelvic tilt) Postural Control Postural Control: Deficits on evaluation Righting Reactions: Delayed and inadequate Protective Responses: delayed  Balance Balance Balance Assessed: Yes Static Sitting Balance Static Sitting - Balance Support: Feet supported;Right upper extremity  supported Static Sitting - Level of Assistance: 5: Stand by assistance Dynamic Sitting Balance Dynamic Sitting - Balance Support: During functional activity;Feet unsupported Dynamic Sitting - Level of Assistance: 5: Stand by assistance;4: Min assist (CGA) Static Standing Balance Static Standing - Balance Support: Bilateral upper extremity supported Static Standing - Level of Assistance: 4: Min assist Dynamic Standing Balance Dynamic Standing - Balance Support: During functional activity;Right upper extremity supported Dynamic Standing - Level of Assistance: 3: Mod assist Extremity/Trunk Assessment RUE Assessment RUE Assessment: Within Functional Limits Passive Range of Motion (PROM) Comments: WFL Active Range of Motion (AROM) Comments: WFL General Strength Comments: 5/5 LUE Assessment LUE Assessment: Exceptions to [facility] Passive Range of Motion (PROM) Comments: WFL Active Range of Motion (AROM) Comments: mildly limited at shoulder and distally at finge extention General Strength Comments: increase strength noted praximally>distally; shoudler flexion 3+/5, bicep 3+/5, tricep 3+/5, wrist flexion/extension -3/5, finger flexion/extension -3/5   Janey Genta 06/07/2022, 4:33 PM

## 2022-06-07 NOTE — Progress Notes (Signed)
PROGRESS NOTE   Subjective/Complaints: SW working on SNF options with family , RIght shoulder pain persists but improved nocturnal pain   ROS- neg CP, SOB, N/V/D. + Right upper extremity discomfort -ok today Objective:   No results found. No results for input(s): "WBC", "HGB", "HCT", "PLT" in the last 72 hours.  No results for input(s): "NA", "K", "CL", "CO2", "GLUCOSE", "BUN", "CREATININE", "CALCIUM" in the last 72 hours.   Intake/Output Summary (Last 24 hours) at 06/07/2022 0930 Last data filed at 06/07/2022 0756 Gross per 24 hour  Intake 395 ml  Output 1595 ml  Net -1200 ml         Physical Exam: Vital Signs Blood pressure (!) 117/106, pulse 90, temperature 97.9 F (36.6 C), temperature source Oral, resp. rate 18, height 5' 11.5" (1.816 m), weight 93.6 kg, SpO2 96 %.   General: No acute distress Mood and affect are appropriate Heart: Regular rate and rhythm no rubs murmurs or extra sounds Lungs: Clear to auscultation, breathing unlabored, no rales or wheezes Abdomen: Positive bowel sounds, soft nontender to palpation, nondistended Extremities: No clubbing, cyanosis, or edema Skin: No evidence of breakdown, no evidence of rash  Psych: Pleasant mood, appropriate affect. Neuro: Fairly alert/attentive, oriented x 3  Eyes without evidence of nystagmus  Tone is normal without evidence of spasticity Cerebellar exam not tested due to weakness on L  3- to 3+/5 Left delt , bi , tri, 2-/5 finger flexors and extensors  3- to 3/5 Left HF, KE, ADF --stable in appearance Sensory exam absent LT in LUE and LLE, senses pinch LUE   Musculoskeletal: Normal passive range of motion right upper extremity, mild tenderness and deformity apparent on palpation.unable to elevate Right arm overhead without assist    Assessment/Plan: 1. Functional deficits which require 3+ hours per day of interdisciplinary therapy in a comprehensive  inpatient rehab setting. Physiatrist is providing close team supervision and 24 hour management of active medical problems listed below. Physiatrist and rehab team continue to assess barriers to discharge/monitor patient progress toward functional and medical goals  Care Tool:  Bathing    Body parts bathed by patient: Left arm, Right arm, Chest, Abdomen, Right upper leg, Left upper leg, Front perineal area, Face   Body parts bathed by helper: Buttocks, Right lower leg, Left lower leg     Bathing assist Assist Level: Minimal Assistance - Patient > 75%     Upper Body Dressing/Undressing Upper body dressing   What is the patient wearing?: Pull over shirt    Upper body assist Assist Level: Moderate Assistance - Patient 50 - 74%    Lower Body Dressing/Undressing Lower body dressing      What is the patient wearing?: Underwear/pull up, Pants     Lower body assist Assist for lower body dressing: Maximal Assistance - Patient 25 - 49%     Toileting Toileting Toileting Activity did not occur (Clothing management and hygiene only): N/A (no void or bm)  Toileting assist Assist for toileting: 2 Helpers     Transfers Chair/bed transfer  Transfers assist     Chair/bed transfer assist level: Moderate Assistance - Patient 50 - 74%     Locomotion  Ambulation   Ambulation assist      Assist level: 2 helpers Assistive device:  (hall rail) Max distance: 30   Walk 10 feet activity   Assist     Assist level: 2 helpers Assistive device:  (hall rail)   Walk 50 feet activity   Assist Walk 50 feet with 2 turns activity did not occur: Safety/medical concerns (Patient unable to ambulate >10' at this time secondary to increased fatigue with poor endurance/activity tolerance)         Walk 150 feet activity   Assist Walk 150 feet activity did not occur: Safety/medical concerns         Walk 10 feet on uneven surface  activity   Assist Walk 10 feet on uneven  surfaces activity did not occur: Safety/medical concerns         Wheelchair     Assist Is the patient using a wheelchair?: Yes Type of Wheelchair: Manual    Wheelchair assist level: Contact Guard/Touching assist Max wheelchair distance: 150'    Wheelchair 50 feet with 2 turns activity    Assist        Assist Level: Contact Guard/Touching assist   Wheelchair 150 feet activity     Assist      Assist Level: Contact Guard/Touching assist   Blood pressure (!) 117/106, pulse 90, temperature 97.9 F (36.6 C), temperature source Oral, resp. rate 18, height 5' 11.5" (1.816 m), weight 93.6 kg, SpO2 96 %.  Medical Problem List and Plan: 1. Functional deficits secondary to right large MCA infarction due to right M2 occlusion status post IR/revascularization as well as history of CVA 2015 with left-sided residual weakness status post loop recorder             -patient may  shower             -ELOS/Goals: 3/28, PT min A, OT min to mod A, SLP supervision-SNF pending , family preference is [facility] ? Bed availability  Team conference today please see physician documentation under team conference tab, met with team  to discuss problems,progress, and goals. Formulized individual treatment plan based on medical history, underlying problem and comorbidities.              -Continue work on visual scanning as he appears to have a visual field cut on the left 2.  Antithrombotics: -DVT/anticoagulation:  Pharmaceutical: Eliquis             -antiplatelet therapy: N/A 3. Pain Management: Tylenol as needed  RIght shoulder pain suspect rotator cuff strain vs aggravation of OA, Xray showing evidence of chronic RTC and Glenohumeral OA, discussed pros and cons of injection , pt poor surgical candidate for repair- given pain and poor candidate for stronger opioids. Pain and ROM improved after  Right glenohumeral joint injection with Marcaine and kenalog  - 3/18 , had pain last noc but no  pain with ROM or exam this am , order Kpad at noc to R shoulder  3/19 pt does not remember using Kpad last noc   4. Mood/Behavior/Sleep: Provide emotional support             -antipsychotic agents: N/A  - sleep chart 5. Neuropsych/cognition: This patient is capable of making decisions on his own behalf. 6. Skin/Wound Care: Routine skin checks 7. Fluids/Electrolytes/Nutrition: Routine in and outs with follow-up chemistries 8.  Dysphagia.  Diet advanced to Dysphagia 3 thin liquids.    9.  Hypertension.  Norvasc 10 mg daily, Toprol-XL 25  mg daily.  Monitor with increased mobility.  Overall well-controlled.  Goal BP less than 180/105, long-term goal normotensive Vitals:   06/07/22 0431 06/07/22 0920  BP: 112/79 (!) 117/106  Pulse: 79 90  Resp: 18   Temp: 97.9 F (36.6 C)   SpO2: 96%   Controlled 3/19  10.  Atrial fibrillation.  Cardiac rate controlled.  Continue Toprol-xl 25 mg daily On Eliquis  11.  Hyperlipidemia.  Lipitor 20 mg daily 12.  Parotid mass, follow-up outpatient 13.  CKD 3a.    creat improved vs prior       Latest Ref Rng & Units 05/30/2022    6:06 AM 05/19/2022    8:48 AM 05/18/2022    4:54 AM  BMP  Glucose 70 - 99 mg/dL 93  182  127   BUN 8 - 23 mg/dL 22  23  18    Creatinine 0.61 - 1.24 mg/dL 1.26  1.14  0.98   Sodium 135 - 145 mmol/L 139  134  137   Potassium 3.5 - 5.1 mmol/L 3.8  3.7  3.8   Chloride 98 - 111 mmol/L 104  99  104   CO2 22 - 32 mmol/L 25  25  22    Calcium 8.9 - 10.3 mg/dL 8.9  9.2  9.1    14.  Insomnia- Mirtazepine and seroquel - daughter concerned about meds causing MS changes, suspect it is mainly CVA - pt feels like he slept better is more alert today , check sleep graph to document labs look normal     15.  Brief episode of nystagmus - suspect peripheral cause will cont to monitor , has remote R cerebellar infarct on MRI but MRA not showing any sig VB disease  16.  Cognitive issues after Right MCA infarct , UA  and labs neg today, sleep has  been disrupted, staff has noted mild agitation/ severe anxiety in the evening prior to starting seroquel     LOS: 20 days A FACE TO [place] E Zeppelin Commisso 06/07/2022, 9:30 AM

## 2022-06-08 DIAGNOSIS — I480 Paroxysmal atrial fibrillation: Secondary | ICD-10-CM | POA: Diagnosis not present

## 2022-06-08 DIAGNOSIS — G459 Transient cerebral ischemic attack, unspecified: Secondary | ICD-10-CM | POA: Diagnosis not present

## 2022-06-08 DIAGNOSIS — I63511 Cerebral infarction due to unspecified occlusion or stenosis of right middle cerebral artery: Secondary | ICD-10-CM | POA: Diagnosis not present

## 2022-06-08 DIAGNOSIS — I10 Essential (primary) hypertension: Secondary | ICD-10-CM | POA: Diagnosis not present

## 2022-06-08 DIAGNOSIS — R1310 Dysphagia, unspecified: Secondary | ICD-10-CM | POA: Diagnosis not present

## 2022-06-08 DIAGNOSIS — R2689 Other abnormalities of gait and mobility: Secondary | ICD-10-CM | POA: Diagnosis not present

## 2022-06-08 DIAGNOSIS — I482 Chronic atrial fibrillation, unspecified: Secondary | ICD-10-CM | POA: Diagnosis not present

## 2022-06-08 DIAGNOSIS — Z743 Need for continuous supervision: Secondary | ICD-10-CM | POA: Diagnosis not present

## 2022-06-08 DIAGNOSIS — Z5189 Encounter for other specified aftercare: Secondary | ICD-10-CM | POA: Diagnosis not present

## 2022-06-08 DIAGNOSIS — I493 Ventricular premature depolarization: Secondary | ICD-10-CM | POA: Diagnosis not present

## 2022-06-08 DIAGNOSIS — R1312 Dysphagia, oropharyngeal phase: Secondary | ICD-10-CM | POA: Diagnosis not present

## 2022-06-08 DIAGNOSIS — R29898 Other symptoms and signs involving the musculoskeletal system: Secondary | ICD-10-CM | POA: Diagnosis not present

## 2022-06-08 DIAGNOSIS — I4729 Other ventricular tachycardia: Secondary | ICD-10-CM | POA: Diagnosis not present

## 2022-06-08 DIAGNOSIS — I6601 Occlusion and stenosis of right middle cerebral artery: Secondary | ICD-10-CM | POA: Diagnosis not present

## 2022-06-08 DIAGNOSIS — Z7401 Bed confinement status: Secondary | ICD-10-CM | POA: Diagnosis not present

## 2022-06-08 DIAGNOSIS — M25511 Pain in right shoulder: Secondary | ICD-10-CM | POA: Diagnosis not present

## 2022-06-08 DIAGNOSIS — F32A Depression, unspecified: Secondary | ICD-10-CM | POA: Diagnosis not present

## 2022-06-08 DIAGNOSIS — M6281 Muscle weakness (generalized): Secondary | ICD-10-CM | POA: Diagnosis not present

## 2022-06-08 DIAGNOSIS — N189 Chronic kidney disease, unspecified: Secondary | ICD-10-CM | POA: Diagnosis not present

## 2022-06-08 DIAGNOSIS — Z86010 Personal history of colonic polyps: Secondary | ICD-10-CM | POA: Diagnosis not present

## 2022-06-08 DIAGNOSIS — R54 Age-related physical debility: Secondary | ICD-10-CM | POA: Diagnosis not present

## 2022-06-08 DIAGNOSIS — G4700 Insomnia, unspecified: Secondary | ICD-10-CM | POA: Diagnosis not present

## 2022-06-08 MED ORDER — MIRTAZAPINE 15 MG PO TBDP: 15.0000 mg | ORAL_TABLET | Freq: Every day | ORAL | Status: DC

## 2022-06-08 MED ORDER — QUETIAPINE FUMARATE 25 MG PO TABS: 12.5000 mg | ORAL_TABLET | Freq: Every evening | ORAL | Status: DC

## 2022-06-08 MED ORDER — TRAMADOL HCL 50 MG PO TABS: 25.0000 mg | ORAL_TABLET | Freq: Four times a day (QID) | ORAL | 0 refills | Status: DC | PRN

## 2022-06-08 MED ORDER — QUETIAPINE FUMARATE 25 MG PO TABS: 12.5000 mg | ORAL_TABLET | Freq: Every evening | ORAL | 0 refills | Status: DC

## 2022-06-08 NOTE — Plan of Care (Signed)
  Problem: RH Problem Solving Goal: LTG Patient will demonstrate problem solving for (SLP) Description: LTG:  Patient will demonstrate problem solving for basic/complex daily situations with cues  (SLP) Outcome: Not Met (add Reason)   Problem: RH Swallowing Goal: LTG Patient will consume least restrictive diet using compensatory strategies with assistance (SLP) Description: LTG:  Patient will consume least restrictive diet using compensatory strategies with assistance (SLP) Outcome: Completed/Met Goal: LTG Pt will demonstrate functional change in swallow as evidenced by bedside/clinical objective assessment (SLP) Description: LTG: Patient will demonstrate functional change in swallow as evidenced by bedside/clinical objective assessment (SLP) Outcome: Completed/Met   Problem: RH Awareness Goal: LTG: Patient will demonstrate awareness during functional activites type of (SLP) Description: LTG: Patient will demonstrate awareness during functional activites type of (SLP) Outcome: Completed/Met

## 2022-06-08 NOTE — Progress Notes (Signed)
PROGRESS NOTE   Subjective/Complaints: Going to [facility] SNF today   ROS- neg CP, SOB, N/V/D. + Right upper extremity discomfort -ok today Objective:   No results found. No results for input(s): "WBC", "HGB", "HCT", "PLT" in the last 72 hours.  No results for input(s): "NA", "K", "CL", "CO2", "GLUCOSE", "BUN", "CREATININE", "CALCIUM" in the last 72 hours.   Intake/Output Summary (Last 24 hours) at 06/08/2022 0932 Last data filed at 06/08/2022 0806 Gross per 24 hour  Intake 654 ml  Output 925 ml  Net -271 ml         Physical Exam: Vital Signs Blood pressure 139/77, pulse 81, temperature 98.5 F (36.9 C), temperature source Oral, resp. rate 16, height 5' 11.5" (1.816 m), weight 93.6 kg, SpO2 94 %.   General: No acute distress Mood and affect are appropriate Heart: Regular rate and rhythm no rubs murmurs or extra sounds Lungs: Clear to auscultation, breathing unlabored, no rales or wheezes Abdomen: Positive bowel sounds, soft nontender to palpation, nondistended Extremities: No clubbing, cyanosis, or edema Skin: No evidence of breakdown, no evidence of rash  Psych: Pleasant mood, appropriate affect. Neuro: Fairly alert/attentive, oriented x 3  Eyes without evidence of nystagmus  Tone is normal without evidence of spasticity Cerebellar exam not tested due to weakness on L  3- to 3+/5 Left delt , bi , tri, 2-/5 finger flexors and extensors  3- to 3/5 Left HF, KE, ADF --stable in appearance Sensory exam absent LT in LUE and LLE, senses pinch LUE   Musculoskeletal: Normal passive range of motion right upper extremity, mild tenderness and deformity apparent on palpation.unable to elevate Right arm overhead without assist    Assessment/Plan: 1. Functional deficits due to R MCA infarct  Stable for D/C today F/u PCP in 3-4 weeks F/u PM&R 2 weeks See D/C summary See D/C instructions   Care Tool:  Bathing     Body parts bathed by patient: Left arm, Right arm, Chest, Abdomen, Right upper leg, Left upper leg, Front perineal area, Face   Body parts bathed by helper: Buttocks, Right lower leg, Left lower leg     Bathing assist Assist Level: Minimal Assistance - Patient > 75%     Upper Body Dressing/Undressing Upper body dressing   What is the patient wearing?: Pull over shirt    Upper body assist Assist Level: Moderate Assistance - Patient 50 - 74%    Lower Body Dressing/Undressing Lower body dressing      What is the patient wearing?: Underwear/pull up, Pants     Lower body assist Assist for lower body dressing: Maximal Assistance - Patient 25 - 49%     Toileting Toileting Toileting Activity did not occur (Clothing management and hygiene only): N/A (no void or bm)  Toileting assist Assist for toileting: Maximal Assistance - Patient 25 - 49%     Transfers Chair/bed transfer  Transfers assist     Chair/bed transfer assist level: Minimal Assistance - Patient > 75% (squat pivot)     Locomotion Ambulation   Ambulation assist      Assist level: 2 helpers Assistive device: Hand held assist Max distance: 112ft   Walk 10 feet activity  Assist     Assist level: 2 helpers Assistive device: Hand held assist   Walk 50 feet activity   Assist Walk 50 feet with 2 turns activity did not occur: Safety/medical concerns (Patient unable to ambulate >10' at this time secondary to increased fatigue with poor endurance/activity tolerance)  Assist level: 2 helpers Assistive device: Hand held assist    Walk 150 feet activity   Assist Walk 150 feet activity did not occur: Safety/medical concerns         Walk 10 feet on uneven surface  activity   Assist Walk 10 feet on uneven surfaces activity did not occur: Safety/medical concerns         Wheelchair     Assist Is the patient using a wheelchair?: Yes Type of Wheelchair: Manual    Wheelchair assist  level: Contact Guard/Touching assist (controlled environment) Max wheelchair distance: 150'    Wheelchair 50 feet with 2 turns activity    Assist        Assist Level: Contact Guard/Touching assist   Wheelchair 150 feet activity     Assist      Assist Level: Contact Guard/Touching assist   Blood pressure 139/77, pulse 81, temperature 98.5 F (36.9 C), temperature source Oral, resp. rate 16, height 5' 11.5" (1.816 m), weight 93.6 kg, SpO2 94 %.  Medical Problem List and Plan: 1. Functional deficits secondary to right large MCA infarction due to right M2 occlusion status post IR/revascularization as well as history of CVA 2015 with left-sided residual weakness status post loop recorder             -patient may  shower             -ELOS/Goals: 3/21 to SNF              -Continue work on visual scanning as he appears to have a visual field cut on the left 2.  Antithrombotics: -DVT/anticoagulation:  Pharmaceutical: Eliquis             -antiplatelet therapy: N/A 3. Pain Management: Tylenol as needed  RIght shoulder pain suspect rotator cuff strain vs aggravation of OA, Xray showing evidence of chronic RTC and Glenohumeral OA, discussed pros and cons of injection , pt poor surgical candidate for repair- given pain and poor candidate for stronger opioids. Pain and ROM improved after  Right glenohumeral joint injection with Marcaine and kenalog  - 3/18 , had pain last noc but no pain with ROM or exam this am , order Kpad at noc to R shoulder  3/19 pt does not remember using Kpad last noc   4. Mood/Behavior/Sleep: Provide emotional support             -antipsychotic agents: N/A  - sleep chart 5. Neuropsych/cognition: This patient is capable of making decisions on his own behalf. 6. Skin/Wound Care: Routine skin checks 7. Fluids/Electrolytes/Nutrition: Routine in and outs with follow-up chemistries 8.  Dysphagia.  Diet advanced to Dysphagia 3 thin liquids.    9.  Hypertension.   Norvasc 10 mg daily, Toprol-XL 25 mg daily.  Monitor with increased mobility.  Overall well-controlled.  Goal BP less than 180/105, long-term goal normotensive Vitals:   06/07/22 1923 06/08/22 0453  BP: 122/75 139/77  Pulse: 84 81  Resp: 17 16  Temp: 97.8 F (36.6 C) 98.5 F (36.9 C)  SpO2: 91% 94%  Controlled 3/19  10.  Atrial fibrillation.  Cardiac rate controlled.  Continue Toprol-xl 25 mg daily On Eliquis  11.  Hyperlipidemia.  Lipitor 20 mg daily 12.  Parotid mass, follow-up outpatient 13.  CKD 3a.    creat improved vs prior       Latest Ref Rng & Units 05/30/2022    6:06 AM 05/19/2022    8:48 AM 05/18/2022    4:54 AM  BMP  Glucose 70 - 99 mg/dL 93  182  127   BUN 8 - 23 mg/dL 22  23  18    Creatinine 0.61 - 1.24 mg/dL 1.26  1.14  0.98   Sodium 135 - 145 mmol/L 139  134  137   Potassium 3.5 - 5.1 mmol/L 3.8  3.7  3.8   Chloride 98 - 111 mmol/L 104  99  104   CO2 22 - 32 mmol/L 25  25  22    Calcium 8.9 - 10.3 mg/dL 8.9  9.2  9.1    14.  Insomnia- Mirtazepine and seroquel - daughter concerned about meds causing MS changes, suspect it is mainly CVA - pt feels like he slept better is more alert today , check sleep graph to document labs look normal     15.  Brief episode of nystagmus - suspect peripheral cause will cont to monitor , has remote R cerebellar infarct on MRI but MRA not showing any sig VB disease  16.  Cognitive issues after Right MCA infarct , UA  and labs neg today, sleep has been disrupted, staff has noted mild agitation/ severe anxiety in the evening prior to starting seroquel     LOS: 21 days A FACE TO [place] E Kayana Thoen 06/08/2022, 9:32 AM

## 2022-06-08 NOTE — Progress Notes (Signed)
Inpatient Rehabilitation Care Coordinator Discharge Note   Patient Details  Name: Edward [facility]nard MRN: [id number] Date of Birth: 05-13-1933   Discharge location: SNF [facility])  Length of Stay: 21 Days  Discharge activity level: Mod/Max  Home/community participation: Daughter/SNF  Patient response [id number] Literacy - How often do you need to have someone help you when you read instructions, pamphlets, or other written material from your doctor or pharmacy?: Sometimes  Patient response [id number] Isolation - How often do you feel lonely or isolated from those around you?: Never  Services provided included: MD, RD, PT, OT, SLP, CM, RN, TR, Pharmacy, Neuropsych, SW  Financial Services:  Charity fundraiser Utilized: [place] offered to/list presented to: Daughter, Judie Grieve.  Follow-up services arranged:              Patient response to transportation need: Is the patient able to respond to transportation needs?: Yes In the past 12 months, has lack of transportation kept you from medical appointments or from getting medications?: No In the past 12 months, has lack of transportation kept you from meetings, work, or from getting things needed for daily living?: No    Comments (or additional information):  Patient/Family verbalized understanding of follow-up arrangements:  Yes  Individual responsible for coordination of the follow-up plan: [name] (daughter)  Confirmed correct DME delivered: Dyanne Iha 06/08/2022    Dyanne Iha

## 2022-06-08 NOTE — Progress Notes (Signed)
Inpatient Rehabilitation Discharge Medication Review by a Pharmacist A complete drug regimen review was completed for this patient to identify any potential clinically significant medication issues.   High Risk Drug Classes Is patient taking? Indication by Medication  Antipsychotic No    Anticoagulant Yes Apixaban- Afib  Antibiotic No    Opioid yes  Tramadol-pain  Antiplatelet No    Hypoglycemics/insulin No    Vasoactive Medication Yes Norvasc, Toprol- HTN, Afib  Chemotherapy No    Other Yes Lipitor- HLD Mirtazapine, Seroquel - mood stabilization          Type of Medication Issue Identified Description of Issue Recommendation(s)  Drug Interaction(s) (clinically significant)        Duplicate Therapy        Allergy        No Medication Administration End Date        Incorrect Dose        Additional Drug Therapy Needed        Significant med changes from prior encounter (inform family/care partners about these prior to discharge). PTA meds: Zocor discontinued Xarelto discontinued  Zocor discontinued  has been replaced with Lipitor Xarelto has been replaced with apixaban Communicate at discharge   Other   Trazodone - discontinued on discharge from CIR. Communicate at discharge       Clinically significant medication issues were identified that warrant physician communication and completion of prescribed/recommended actions by midnight of the next day:  No   Time spent performing this drug regimen review (minutes):  30  Thank you for allowing pharmacy to be part of this patients care team. Nicole Cella, RPh Clinical Pharmacist 06/08/2022 10:12 AM

## 2022-06-08 NOTE — Progress Notes (Signed)
Speech Language Pathology Discharge Summary  Patient Details  Name: Edward Maynard MRN: [id number] Date of Birth: Oct 14, 1933  Date of Discharge from [place] service:June 08, 2022   Patient has met 3 of 4 long term goals.  Patient to discharge at [facility] level.   Reasons goals not met: Patient is making functional gains but but is discharging o SNF sooner than anticipated with LTGs initially being set for a longer LOS.   Clinical Impression/Discharge Summary: Patient has made functional gains and has met 3 of 4 LTGs this admission. Currently, patient is consuming Dys. 3 textures with thin liquids with minimal overt s/s of aspiration and is overall Mod I for use of swallowing compensatory strategies. Patient demonstrates improved emergent awareness of deficits but continues to require overall Mod verbal cues for functional problem solving and left visual scanning with functional tasks. Patient and family education is complete but patient's family is unable to provide the necessary level of assistance needed at this time, therefore, patient will discharge to a SNF. Patient would benefit from f/u SLP services to maximize his swallowing and cognitive functioning in order to reduce caregiver burden.   Care Partner:  Caregiver Able to Provide Assistance: No  Type of Caregiver Assistance: Physical;Cognitive  Recommendation:  Skilled Nursing facility  Rationale for SLP Follow Up: Maximize cognitive function and independence;Maximize functional communication;Maximize swallowing safety   Equipment: N/A   Reasons for discharge: Discharged from hospital   Patient/Family Agrees with Progress Made and Goals Achieved: Yes    [place], [place] 06/08/2022, 4:00 PM

## 2022-06-08 NOTE — Progress Notes (Signed)
Patient ID: Edward Maynard, male   DOB: 24-Nov-1933, 89 y.o.   MRN: [id number]  Patient d/c to [facility] today. Family plans to meet patient at [facility]. Room #502P. Phone number for report: [phone]. D/C packet at nursing station.

## 2022-06-08 NOTE — Plan of Care (Signed)
  Problem: RH Grooming Goal: LTG Patient will perform grooming w/assist,cues/equip (OT) Description: LTG: Patient will perform grooming with assist, with/without cues using equipment (OT) Outcome: Not Met (add Reason) Note: Pt requires mod A for grooming hygiene tasks d/t decreased sensation, cognitive deficits, and decreased proprioception.    Problem: RH Dressing Goal: LTG Patient will perform lower body dressing w/assist (OT) Description: LTG: Patient will perform lower body dressing with assist, with/without cues in positioning using equipment (OT) Outcome: Not Met (add Reason) Note: Pt made progress towards increased participation in LB dressing, however Pt continues to require max A d/t hemiparesis, cognitive deficits, and balance deficits 2/2 to CVA.    Problem: RH Toileting Goal: LTG Patient will perform toileting task (3/3 steps) with assistance level (OT) Description: LTG: Patient will perform toileting task (3/3 steps) with assistance level (OT)  Outcome: Not Met (add Reason) Note: Pt requires max A for toileting d/t deficits 2/2 to CVA.    Problem: RH Attention Goal: LTG Patient will demonstrate this level of attention during functional activites (OT) Description: LTG:  Patient will demonstrate this level of attention during functional activites  (OT) Outcome: Not Met (add Reason) Note: Pt continues to present with cognitive deficits impacting his selective attention. Pt benefits from completing tasks in quiet environment without distractions.    Problem: Sit to Stand Goal: LTG:  Patient will perform sit to stand in prep for activites of daily living with assistance level (OT) Description: LTG:  Patient will perform sit to stand in prep for activites of daily living with assistance level (OT) Outcome: Completed/Met   Problem: RH Eating Goal: LTG Patient will perform eating w/assist, cues/equip (OT) Description: LTG: Patient will perform eating with assist, with/without cues  using equipment (OT) Outcome: Completed/Met   Problem: RH Bathing Goal: LTG Patient will bathe all body parts with assist levels (OT) Description: LTG: Patient will bathe all body parts with assist levels (OT) Outcome: Completed/Met   Problem: RH Functional Use of Upper Extremity Goal: LTG Patient will use RT/LT upper extremity as a (OT) Description: LTG: Patient will use right/left upper extremity as a stabilizer/gross assist/diminished/nondominant/dominant level with assist, with/without cues during functional activity (OT) Outcome: Completed/Met   Problem: RH Toilet Transfers Goal: LTG Patient will perform toilet transfers w/assist (OT) Description: LTG: Patient will perform toilet transfers with assist, with/without cues using equipment (OT) Outcome: Completed/Met   Problem: RH Tub/Shower Transfers Goal: LTG Patient will perform tub/shower transfers w/assist (OT) Description: LTG: Patient will perform tub/shower transfers with assist, with/without cues using equipment (OT) Outcome: Completed/Met   Problem: RH Memory Goal: LTG Patient will demonstrate ability for day to day recall/carry over during activities of daily living with assistance level (OT) Description: LTG:  Patient will demonstrate ability for day to day recall/carry over during activities of daily living with assistance level (OT). Outcome: Completed/Met

## 2022-06-09 NOTE — Progress Notes (Addendum)
Patient ID: Edward Maynard, male   DOB: 09-26-1933, 89 y.o.   MRN: [id number]  SW received a call from [place]. Eliquis is costing the facility $1,000 to cover and is too expense. Facility requesting 30-day script to be filled at hospital for family to pick up to drop off at facility. Sw followed up with PA. Sw will will wait for FU and follow up with facility.   3:19 PM Eliquis card picked up by daughter.

## 2022-06-12 DIAGNOSIS — I480 Paroxysmal atrial fibrillation: Secondary | ICD-10-CM | POA: Diagnosis not present

## 2022-06-12 DIAGNOSIS — I4729 Other ventricular tachycardia: Secondary | ICD-10-CM | POA: Diagnosis not present

## 2022-06-12 DIAGNOSIS — I10 Essential (primary) hypertension: Secondary | ICD-10-CM | POA: Diagnosis not present

## 2022-07-07 DIAGNOSIS — Z5189 Encounter for other specified aftercare: Secondary | ICD-10-CM

## 2022-07-07 DIAGNOSIS — F32A Depression, unspecified: Secondary | ICD-10-CM

## 2022-07-10 DIAGNOSIS — G4700 Insomnia, unspecified: Secondary | ICD-10-CM | POA: Diagnosis not present

## 2022-07-11 ENCOUNTER — Encounter: Payer: Medicare Other | Attending: Physical Medicine & Rehabilitation | Admitting: Physical Medicine & Rehabilitation

## 2022-07-11 ENCOUNTER — Encounter: Payer: Self-pay | Admitting: Physical Medicine & Rehabilitation

## 2022-07-11 VITALS — BP 124/74 | HR 86

## 2022-07-11 DIAGNOSIS — I63511 Cerebral infarction due to unspecified occlusion or stenosis of right middle cerebral artery: Secondary | ICD-10-CM | POA: Insufficient documentation

## 2022-07-11 NOTE — Progress Notes (Signed)
Subjective:    Patient ID: Edward Maynard, male    DOB: September 04, 1933, 89 y.o.   MRN: [id number] Patient with steady overall progress barriers of left hemibody sensory deficits and left inattention. He is performing supine to sit using bed features with minimal assist, sit to stand min mod assist for balance min mod assist squat pivot transfers a participating with ambulation 117 feet plus to moderate assist. Patient completed functional reaching tasks left upper extremity with emphasis on grasp and release. He did need assist for lower body ADLs. Speech therapy follow-up noting significant improvement in left side awareness. Due to limited assistance at home skilled nursing facility was needed and patient discharged to skilled nursing facility [facility] .   Edward Maynard is a 89 y.o. 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  Per chart review lives with spouse.  Two-level home.  Reportedly independent prior to admission.  Wife with dementia and limited assistance.  Presented 05/10/2022 with acute onset of left-sided weakness resulting in a fall.  Admission chemistries unremarkable except glucose 158 creatinine 1.33, hemoglobin A1c 5.8.  Cranial CT scan negative.  CT cervical spine negative.  CT angiogram of head and neck showed occlusion of the proximal M2 segment inferior division of the right MCA.  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  Nonspecific 15 mm right parotid nodule.  Patient did not receive tPA.  Interventional radiology consulted underwent revascularization of right M2 distal occlusion.  Echocardiogram with ejection fraction of 60 to 65% no wall motion abnormality grade 2 diastolic dysfunction.  Neurology  follow-up patient transitioned from Xarelto to Eliquis for CVA prophylaxis/atrial fibrillation.  Currently on a dysphagia #2 thin liquid diet.  Therapy evaluations completed due to patient decreased functional mobility and left-sided weakness was admitted for a comprehensive rehab program.   Admit date: 05/18/2022 Discharge date: 06/08/2022 HPI Currently at [facility] but is planning to transfer to [facility] nursing facility.  [facility] is here with him today.  Patient does not feel like he is making much progress in therapy.  He continues to have right shoulder limitations as well.  Slipped out of chair without injury at the skilled nursing facility  Right rotator cuff degeneration with arthritis in glenohumeral and AC joint  Pain Inventory Average Pain 0 Pain Right Now 0 My pain is  no pain  LOCATION OF PAIN  no pain  BOWEL Number of stools per week: 1-2 Oral laxative use Yes  Type of laxative colace Enema or suppository use No  History of colostomy No  Incontinent No   BLADDER Pads In and out cath, frequency . Able to self cath  . Bladder incontinence No  Frequent urination No  Leakage with coughing No  Difficulty starting stream No  Incomplete bladder emptying No    Mobility how many minutes can you walk? 0 ability to climb steps?  no do you drive?  no use a wheelchair needs help with transfers  Function retired I need assistance with the following:  dressing, bathing, toileting, meal prep, household duties, and shopping  Neuro/Psych weakness trouble walking confusion depression anxiety  Prior Studies [facility] f/u  Physicians involved in your care [facility] f/u   Family History  Problem Relation Age of Onset   Hypertension  Mother    Hypertension Father    Stroke Father    COPD Brother    Stroke Brother    Colon cancer Neg Hx    Heart attack Neg Hx    Social History   Socioeconomic History   Marital status: Married     Spouse name: Edward Maynard   Number of children: 2   Years of education: college   Highest education level: Not on file  Occupational History   Occupation: retired  Tobacco Use   Smoking status: Former    Types: Cigarettes    Quit date: 03/20/1970    Years since quitting: 52.3   Smokeless tobacco: Never  Vaping Use   Vaping Use: Never used  Substance and Sexual Activity   Alcohol use: No   Drug use: No   Sexual activity: Yes  Other Topics Concern   Not on file  Social History Narrative   Patient right handed   Patient lives with his wife   Patient coffee daily   Enjoys playing golf   Social Determinants of Health   Financial Resource Strain: Not on file  Food Insecurity: Not on file  Transportation Needs: Not on file  Physical Activity: Not on file  Stress: Not on file  Social Connections: Not on file   Past Surgical History:  Procedure Laterality Date   IR CT HEAD LTD  05/10/2022   IR PERCUTANEOUS ART THROMBECTOMY/INFUSION INTRACRANIAL INC DIAG ANGIO  05/10/2022   LOOP RECORDER IMPLANT N/A 10/31/2013   Procedure: LOOP RECORDER IMPLANT;  Surgeon: Gardiner Rhyme, MD;  Location: [facility] CATH LAB;  Service: Cardiovascular;  Laterality: N/A;   RADIOLOGY WITH ANESTHESIA N/A 05/10/2022   Procedure: IR WITH ANESTHESIA;  Surgeon: Julieanne Cotton, MD;  Location: [facility] OR;  Service: Radiology;  Laterality: N/A;   SHOULDER SURGERY Bilateral 2003, 1995   Past Medical History:  Diagnosis Date   High cholesterol    05/2013; no defecits   Hyperlipemia    Left arm weakness    Left hand weakness    Personal history of colonic polyp - adenoma  10/23/2013   Stroke (HCC) 05/2013   MRI on 07/02/13 = Multifocal acute & subacute infarction involving right frontal MCA/ACA & left parietal MCA/PCA watershed areas   BP 124/74   Pulse 86   SpO2 93%   Opioid Risk Score:   Fall Risk Score:  `1  Depression screen [facility] 2/9     07/11/2022    2:21 PM 01/18/2015    8:32 AM  Depression screen PHQ 2/9   Decreased Interest 3 0  Down, Depressed, Hopeless 3 0  PHQ - 2 Score 6 0  Altered sleeping 3   Tired, decreased energy 3   Change in appetite 2   Feeling bad or failure about yourself  2   Trouble concentrating 2   Moving slowly or fidgety/restless 1   Suicidal thoughts 1   PHQ-9 Score 20   Difficult doing work/chores Not difficult at all      Review of Systems  Musculoskeletal:  Positive for gait problem.  Neurological:  Positive for weakness.  Psychiatric/Behavioral:  Positive for confusion and dysphoric mood. The patient is nervous/anxious.   All other systems reviewed and are negative.     Objective:   Physical Exam  General No acute distress Mood and affect are appropriate Speech without dysarthria or aphasia Cognition recalls my name from [facility].  He is able to follow simple commands.  He has decreased insight into his  deficits Motor strength is 3 - at the right deltoid 4 at the bicep tricep 5/5 at the grip left upper extremity 4/5 deltoid bicep tricep grip with poor motor control Right lower extremity 5/5 in the hip flexor knee extensor ankle dorsiflexion Left lower extremity 4/5 in hip flexion knee extension ankle dorsiflexor Decreased motor control left lower extremity Sensation absent to light touch and proprioception in the left upper limb and left lower limb. Tone without evidence of spasticity      Assessment & Plan:  Right MCA CVA with mild weakness but severe sensory deficits on the left side.  In addition severe right rotator cuff disease with decreased right shoulder elevation. We discussed that from a prognostic standpoint would expect some further recovery up to 6 months however that his sensory impairments do not respond specifically to muscle strengthening exercises.  Discussed with [facility] that prognosis for improvement with gait is limited.  Would like to see the patient back in approximately 4 months to reassess. Also wanted to discuss sleep  medications and family was concerned about the use of Seroquel this was started in the acute care rehab unit to help with severe anxiety episodes at night.  The patient has been off of this for several days with no worsening symptoms however he did have 1 very vivid dream which bothered him and prompted a call to his [facility]. Edward [facility]nard asked about use of trazodone, this was tried during rehab as well without any significant improvements.  Have deferred any treatment of insomnia to the primary attending at [facility].

## 2022-07-11 NOTE — Patient Instructions (Signed)
Has tried trazodone in hospital without improvement

## 2022-07-17 DIAGNOSIS — M25511 Pain in right shoulder: Secondary | ICD-10-CM | POA: Diagnosis not present

## 2022-07-17 DIAGNOSIS — M6281 Muscle weakness (generalized): Secondary | ICD-10-CM | POA: Diagnosis not present

## 2022-07-17 DIAGNOSIS — G4700 Insomnia, unspecified: Secondary | ICD-10-CM | POA: Diagnosis not present

## 2022-07-18 DIAGNOSIS — M6281 Muscle weakness (generalized): Secondary | ICD-10-CM | POA: Diagnosis not present

## 2022-07-18 DIAGNOSIS — Z7189 Other specified counseling: Secondary | ICD-10-CM | POA: Diagnosis not present

## 2022-07-18 DIAGNOSIS — Z710 Person encountering health services to consult on behalf of another person: Secondary | ICD-10-CM | POA: Diagnosis not present

## 2022-07-18 DIAGNOSIS — I63511 Cerebral infarction due to unspecified occlusion or stenosis of right middle cerebral artery: Secondary | ICD-10-CM | POA: Diagnosis not present

## 2022-07-18 DIAGNOSIS — R2689 Other abnormalities of gait and mobility: Secondary | ICD-10-CM | POA: Diagnosis not present

## 2022-07-26 DIAGNOSIS — I63511 Cerebral infarction due to unspecified occlusion or stenosis of right middle cerebral artery: Secondary | ICD-10-CM | POA: Diagnosis not present

## 2022-07-26 DIAGNOSIS — R2681 Unsteadiness on feet: Secondary | ICD-10-CM | POA: Diagnosis not present

## 2022-07-26 DIAGNOSIS — M6281 Muscle weakness (generalized): Secondary | ICD-10-CM | POA: Diagnosis not present

## 2022-07-27 DIAGNOSIS — I63511 Cerebral infarction due to unspecified occlusion or stenosis of right middle cerebral artery: Secondary | ICD-10-CM | POA: Diagnosis not present

## 2022-07-27 DIAGNOSIS — M6281 Muscle weakness (generalized): Secondary | ICD-10-CM | POA: Diagnosis not present

## 2022-07-27 DIAGNOSIS — R2681 Unsteadiness on feet: Secondary | ICD-10-CM | POA: Diagnosis not present

## 2022-07-28 DIAGNOSIS — I63511 Cerebral infarction due to unspecified occlusion or stenosis of right middle cerebral artery: Secondary | ICD-10-CM | POA: Diagnosis not present

## 2022-07-28 DIAGNOSIS — M6281 Muscle weakness (generalized): Secondary | ICD-10-CM | POA: Diagnosis not present

## 2022-07-28 DIAGNOSIS — R2681 Unsteadiness on feet: Secondary | ICD-10-CM | POA: Diagnosis not present

## 2022-07-31 DIAGNOSIS — I63511 Cerebral infarction due to unspecified occlusion or stenosis of right middle cerebral artery: Secondary | ICD-10-CM | POA: Diagnosis not present

## 2022-07-31 DIAGNOSIS — M6281 Muscle weakness (generalized): Secondary | ICD-10-CM | POA: Diagnosis not present

## 2022-07-31 DIAGNOSIS — R2681 Unsteadiness on feet: Secondary | ICD-10-CM | POA: Diagnosis not present

## 2022-08-01 ENCOUNTER — Ambulatory Visit: Payer: Medicare Other | Admitting: Cardiovascular Disease

## 2022-08-01 DIAGNOSIS — M6281 Muscle weakness (generalized): Secondary | ICD-10-CM | POA: Diagnosis not present

## 2022-08-01 DIAGNOSIS — I63511 Cerebral infarction due to unspecified occlusion or stenosis of right middle cerebral artery: Secondary | ICD-10-CM | POA: Diagnosis not present

## 2022-08-01 DIAGNOSIS — R2681 Unsteadiness on feet: Secondary | ICD-10-CM | POA: Diagnosis not present

## 2022-08-02 DIAGNOSIS — R2681 Unsteadiness on feet: Secondary | ICD-10-CM | POA: Diagnosis not present

## 2022-08-02 DIAGNOSIS — I63511 Cerebral infarction due to unspecified occlusion or stenosis of right middle cerebral artery: Secondary | ICD-10-CM | POA: Diagnosis not present

## 2022-08-02 DIAGNOSIS — M6281 Muscle weakness (generalized): Secondary | ICD-10-CM | POA: Diagnosis not present

## 2022-08-03 DIAGNOSIS — M6281 Muscle weakness (generalized): Secondary | ICD-10-CM | POA: Diagnosis not present

## 2022-08-03 DIAGNOSIS — R2681 Unsteadiness on feet: Secondary | ICD-10-CM | POA: Diagnosis not present

## 2022-08-03 DIAGNOSIS — I63511 Cerebral infarction due to unspecified occlusion or stenosis of right middle cerebral artery: Secondary | ICD-10-CM | POA: Diagnosis not present

## 2022-08-04 DIAGNOSIS — R2681 Unsteadiness on feet: Secondary | ICD-10-CM | POA: Diagnosis not present

## 2022-08-04 DIAGNOSIS — M6281 Muscle weakness (generalized): Secondary | ICD-10-CM | POA: Diagnosis not present

## 2022-08-04 DIAGNOSIS — I63511 Cerebral infarction due to unspecified occlusion or stenosis of right middle cerebral artery: Secondary | ICD-10-CM | POA: Diagnosis not present

## 2022-08-07 DIAGNOSIS — M6281 Muscle weakness (generalized): Secondary | ICD-10-CM | POA: Diagnosis not present

## 2022-08-07 DIAGNOSIS — R2681 Unsteadiness on feet: Secondary | ICD-10-CM | POA: Diagnosis not present

## 2022-08-07 DIAGNOSIS — I63511 Cerebral infarction due to unspecified occlusion or stenosis of right middle cerebral artery: Secondary | ICD-10-CM | POA: Diagnosis not present

## 2022-08-08 ENCOUNTER — Other Ambulatory Visit: Payer: Self-pay

## 2022-08-08 DIAGNOSIS — M6281 Muscle weakness (generalized): Secondary | ICD-10-CM | POA: Diagnosis not present

## 2022-08-08 DIAGNOSIS — I63511 Cerebral infarction due to unspecified occlusion or stenosis of right middle cerebral artery: Secondary | ICD-10-CM | POA: Diagnosis not present

## 2022-08-08 DIAGNOSIS — R2681 Unsteadiness on feet: Secondary | ICD-10-CM | POA: Diagnosis not present

## 2022-08-08 NOTE — Patient Outreach (Signed)
First telephone outreach attempt to obtain mRS. No answer. Left message for returned call.  Chameka Mcmullen [facility]-Care Management Assistant [phone]  

## 2022-08-10 ENCOUNTER — Non-Acute Institutional Stay (SKILLED_NURSING_FACILITY): Payer: Medicare Other | Admitting: Internal Medicine

## 2022-08-10 ENCOUNTER — Encounter: Payer: Self-pay | Admitting: Internal Medicine

## 2022-08-10 DIAGNOSIS — M7541 Impingement syndrome of right shoulder: Secondary | ICD-10-CM | POA: Diagnosis not present

## 2022-08-10 DIAGNOSIS — I6601 Occlusion and stenosis of right middle cerebral artery: Secondary | ICD-10-CM

## 2022-08-10 DIAGNOSIS — I10 Essential (primary) hypertension: Secondary | ICD-10-CM

## 2022-08-10 DIAGNOSIS — I482 Chronic atrial fibrillation, unspecified: Secondary | ICD-10-CM | POA: Diagnosis not present

## 2022-08-10 DIAGNOSIS — N1831 Chronic kidney disease, stage 3a: Secondary | ICD-10-CM | POA: Diagnosis not present

## 2022-08-10 NOTE — Assessment & Plan Note (Signed)
Current creatinine is 1.26 with a GFR of 56 indicating CKD stage IIIa.  Med list reviewed; no change indicated unless there is progression of CKD.

## 2022-08-10 NOTE — Assessment & Plan Note (Signed)
BP controlled; no change in antihypertensive medications  

## 2022-08-10 NOTE — Assessment & Plan Note (Signed)
Follow-up with Dr. Wynn Banker as scheduled.

## 2022-08-10 NOTE — Patient Instructions (Signed)
See assessment and plan under each diagnosis in the problem list and acutely for this visit 

## 2022-08-10 NOTE — Progress Notes (Signed)
NURSING HOME LOCATION:  [facility] ROOM NUMBER:  [id number]  CODE STATUS:  Full Code  PCP: Vianne Bulls MD  This is a comprehensive admission note to this SNFperformed on this date less than 30 days from date of admission. Included are preadmission medical/surgical history; reconciled medication list; family history; social history and comprehensive review of systems.  Corrections and additions to the records were documented. Comprehensive physical exam was also performed. Additionally a clinical summary was entered for each active diagnosis pertinent to this admission in the Problem List to enhance continuity of care.  HPI: He had been hospitalized 2/21 - 05/18/2022 with right middle cerebral artery stroke, presenting with acute onset of left-sided weakness resulting in fall.  This was in the context of history of atrial fibrillation for which he was on Xarelto.  Also he had a prior right frontal MCA/ACA and left parietal MCA/PCA watershed infarction with residual left-sided weakness in 2015. CT angiogram of the head and neck revealed occlusion of the proximal M2 segment of the inferior division of the right MCA.  Atherosclerotic web was present at the right carotid bifurcation with less than 50% stenosis.  tPA was not initiated.  Interventional Radiology completed revascularization of the right M2 distal occlusion. Echo revealed grade 2 diastolic dysfunction.  Neurology consulted and transitioned him from Xarelto to Eliquis for CVA prophylaxis in the context of atrial fibrillation.  Dysphagia 2 thin liquid diet was continued. Incidental finding was a parotid nodule.  Also clinically he had right rotator cuff strain with imaging suggesting evidence of chronic RTC and glenohumeral osteoarthritis.  Patient was not felt to be a surgical candidate but did receive right glenohumeral joint injection with some relief. Diet was advanced to mechanical soft. Mood stabilization was pursued  with Remeron as well as Seroquel. He was admitted for comprehensive rehab program 2/29-3/21 because of the decreased functional mobility and left-sided weakness.  He received PT, ST and OT for at least 3 hours 5 days a week. He was discharged to [facility] but subsequently transferred here to [facility].  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  Past medical and surgical history: Includes dyslipidemia, essential hypertension, and history of adenomatous colon polyps. Surgeries and procedures include bilateral shoulder surgery, colon polypectomy, and loop recorder implantation in 2015.  Social history: PTA he had lived with his wife in two-level home. Nondrinker; former smoker, < 1 ppd for approximately 10 years.  Family history: Noncontributory due to advanced age.   Review of systems: He states that he was transferred here from [facility] yesterday.  He expresses dissatisfaction with the limited rehab he got at that facility.  He states that his major problem is "cannot walk from the stroke."  He describes weakness from the left shoulder to the left lower extremity.  He states that his right shoulder pain was related to being manipulated by staff while he was hospitalized at [facility].  He does not remember having the shoulder injection.  He describes his left upper extremity as feeling "funny" but cannot qualify it or quantitate otherwise.  Constitutional: No fever, significant weight change  Eyes: No redness, discharge, pain ENT/mouth: No nasal congestion, purulent discharge, earache, change in hearing, sore throat  Cardiovascular: No chest pain, palpitations, paroxysmal nocturnal dyspnea, claudication, edema  Respiratory: No cough, sputum production, hemoptysis, DOE, significant snoring, apnea Gastrointestinal: No heartburn, dysphagia, abdominal pain, nausea /vomiting, rectal bleeding, melena, change in bowels Genitourinary: No dysuria, hematuria,  pyuria, incontinence, nocturia Dermatologic: No rash, pruritus, change in appearance of skin Neurologic: No dizziness, headache, syncope, seizures Psychiatric: No significant insomnia, anorexia Endocrine: No change in hair/skin/nails, excessive thirst, excessive hunger, excessive urination  Hematologic/lymphatic: No significant bruising, lymphadenopathy, abnormal bleeding Allergy/immunology: No itchy/watery eyes, significant sneezing, urticaria, angioedema  Physical exam:  Pertinent or positive findings: He appears his stated age.  He has pattern alopecia.  Extraocular motion is grossly intact.  Visual acuity to direct confrontation appears to be compromised in the left eye. Slight gallop suggested. Grade 1/2 systolic murmur is present.  Pedal pulses are decreased.  The left upper extremity is weaker than the right and the left lower extremity weaker than the right lower extremity.  He has interosseous wasting of the hands.  There was some subtle delay following commands; no major neurocognitive deficit was suggested however.  General appearance: Adequately nourished; no acute distress, increased work of breathing is present.   Lymphatic: No lymphadenopathy about the head, neck, axilla. Eyes: No conjunctival inflammation or lid edema is present. There is no scleral icterus. Ears:  External ear exam shows no significant lesions or deformities.   Nose:  External nasal examination shows no deformity or inflammation. Nasal mucosa are pink and moist without lesions, exudates Oral exam: Lips and gums are healthy appearing.There is no oropharyngeal erythema or exudate. Neck:  No thyromegaly, masses, tenderness noted.    Heart:  Normal rate and regular rhythm. S1 and S2 normal without gallop, click, rub.  Lungs: Chest clear to auscultation without wheezes, rhonchi, rales, rubs. Abdomen: Bowel sounds are normal.  Abdomen is soft and nontender with no organomegaly, hernias, masses. GU: Deferred   Extremities:  No cyanosis, clubbing, edema. Neurologic exam: Balance, Rhomberg, finger to nose testing could not be completed due to clinical state Skin: Warm & dry w/o tenting. No significant lesions or rash.  See clinical summary under each active problem in the Problem List with associated updated therapeutic plan

## 2022-08-10 NOTE — Assessment & Plan Note (Addendum)
Clinically there is a slight gallop and a grade 1/2 systolic murmur; but the rhythm appears regular. Most recent EKG was 05/10/2022 and revealed A-fib with rapid ventricular response with a ventricular rate of 122.  Continue Eliquis prophylaxis.

## 2022-08-10 NOTE — Assessment & Plan Note (Addendum)
Major compromise is residual left-sided weakness.  PT/OT at SNF as tolerated. Xarelto changed to Eliquis prophylaxis

## 2022-08-11 DIAGNOSIS — M6389 Disorders of muscle in diseases classified elsewhere, multiple sites: Secondary | ICD-10-CM | POA: Diagnosis not present

## 2022-08-11 DIAGNOSIS — R278 Other lack of coordination: Secondary | ICD-10-CM | POA: Diagnosis not present

## 2022-08-11 DIAGNOSIS — I69352 Hemiplegia and hemiparesis following cerebral infarction affecting left dominant side: Secondary | ICD-10-CM | POA: Diagnosis not present

## 2022-08-11 DIAGNOSIS — M62562 Muscle wasting and atrophy, not elsewhere classified, left lower leg: Secondary | ICD-10-CM | POA: Diagnosis not present

## 2022-08-11 DIAGNOSIS — I6601 Occlusion and stenosis of right middle cerebral artery: Secondary | ICD-10-CM | POA: Diagnosis not present

## 2022-08-11 DIAGNOSIS — R2689 Other abnormalities of gait and mobility: Secondary | ICD-10-CM | POA: Diagnosis not present

## 2022-08-14 DIAGNOSIS — R278 Other lack of coordination: Secondary | ICD-10-CM | POA: Diagnosis not present

## 2022-08-14 DIAGNOSIS — I6601 Occlusion and stenosis of right middle cerebral artery: Secondary | ICD-10-CM | POA: Diagnosis not present

## 2022-08-14 DIAGNOSIS — I69352 Hemiplegia and hemiparesis following cerebral infarction affecting left dominant side: Secondary | ICD-10-CM | POA: Diagnosis not present

## 2022-08-14 DIAGNOSIS — R2689 Other abnormalities of gait and mobility: Secondary | ICD-10-CM | POA: Diagnosis not present

## 2022-08-14 DIAGNOSIS — M6389 Disorders of muscle in diseases classified elsewhere, multiple sites: Secondary | ICD-10-CM | POA: Diagnosis not present

## 2022-08-14 DIAGNOSIS — M62562 Muscle wasting and atrophy, not elsewhere classified, left lower leg: Secondary | ICD-10-CM | POA: Diagnosis not present

## 2022-08-15 DIAGNOSIS — R2689 Other abnormalities of gait and mobility: Secondary | ICD-10-CM | POA: Diagnosis not present

## 2022-08-15 DIAGNOSIS — M6389 Disorders of muscle in diseases classified elsewhere, multiple sites: Secondary | ICD-10-CM | POA: Diagnosis not present

## 2022-08-15 DIAGNOSIS — M62562 Muscle wasting and atrophy, not elsewhere classified, left lower leg: Secondary | ICD-10-CM | POA: Diagnosis not present

## 2022-08-15 DIAGNOSIS — I6601 Occlusion and stenosis of right middle cerebral artery: Secondary | ICD-10-CM | POA: Diagnosis not present

## 2022-08-15 DIAGNOSIS — I69352 Hemiplegia and hemiparesis following cerebral infarction affecting left dominant side: Secondary | ICD-10-CM | POA: Diagnosis not present

## 2022-08-15 DIAGNOSIS — R278 Other lack of coordination: Secondary | ICD-10-CM | POA: Diagnosis not present

## 2022-08-16 ENCOUNTER — Inpatient Hospital Stay: Payer: Medicare Other | Admitting: Neurology

## 2022-08-16 DIAGNOSIS — M6389 Disorders of muscle in diseases classified elsewhere, multiple sites: Secondary | ICD-10-CM | POA: Diagnosis not present

## 2022-08-16 DIAGNOSIS — M62562 Muscle wasting and atrophy, not elsewhere classified, left lower leg: Secondary | ICD-10-CM | POA: Diagnosis not present

## 2022-08-16 DIAGNOSIS — I69352 Hemiplegia and hemiparesis following cerebral infarction affecting left dominant side: Secondary | ICD-10-CM | POA: Diagnosis not present

## 2022-08-16 DIAGNOSIS — R1312 Dysphagia, oropharyngeal phase: Secondary | ICD-10-CM | POA: Diagnosis not present

## 2022-08-16 DIAGNOSIS — I6601 Occlusion and stenosis of right middle cerebral artery: Secondary | ICD-10-CM | POA: Diagnosis not present

## 2022-08-16 DIAGNOSIS — R278 Other lack of coordination: Secondary | ICD-10-CM | POA: Diagnosis not present

## 2022-08-16 DIAGNOSIS — R2689 Other abnormalities of gait and mobility: Secondary | ICD-10-CM | POA: Diagnosis not present

## 2022-08-17 DIAGNOSIS — R278 Other lack of coordination: Secondary | ICD-10-CM | POA: Diagnosis not present

## 2022-08-17 DIAGNOSIS — M6389 Disorders of muscle in diseases classified elsewhere, multiple sites: Secondary | ICD-10-CM | POA: Diagnosis not present

## 2022-08-17 DIAGNOSIS — R1312 Dysphagia, oropharyngeal phase: Secondary | ICD-10-CM | POA: Diagnosis not present

## 2022-08-17 DIAGNOSIS — I6601 Occlusion and stenosis of right middle cerebral artery: Secondary | ICD-10-CM | POA: Diagnosis not present

## 2022-08-17 DIAGNOSIS — I69352 Hemiplegia and hemiparesis following cerebral infarction affecting left dominant side: Secondary | ICD-10-CM | POA: Diagnosis not present

## 2022-08-17 DIAGNOSIS — M62562 Muscle wasting and atrophy, not elsewhere classified, left lower leg: Secondary | ICD-10-CM | POA: Diagnosis not present

## 2022-08-17 DIAGNOSIS — R2689 Other abnormalities of gait and mobility: Secondary | ICD-10-CM | POA: Diagnosis not present

## 2022-08-18 DIAGNOSIS — M6389 Disorders of muscle in diseases classified elsewhere, multiple sites: Secondary | ICD-10-CM | POA: Diagnosis not present

## 2022-08-18 DIAGNOSIS — R2689 Other abnormalities of gait and mobility: Secondary | ICD-10-CM | POA: Diagnosis not present

## 2022-08-18 DIAGNOSIS — R278 Other lack of coordination: Secondary | ICD-10-CM | POA: Diagnosis not present

## 2022-08-18 DIAGNOSIS — M62562 Muscle wasting and atrophy, not elsewhere classified, left lower leg: Secondary | ICD-10-CM | POA: Diagnosis not present

## 2022-08-18 DIAGNOSIS — I69352 Hemiplegia and hemiparesis following cerebral infarction affecting left dominant side: Secondary | ICD-10-CM | POA: Diagnosis not present

## 2022-08-18 DIAGNOSIS — I6601 Occlusion and stenosis of right middle cerebral artery: Secondary | ICD-10-CM | POA: Diagnosis not present

## 2022-08-18 DIAGNOSIS — R1312 Dysphagia, oropharyngeal phase: Secondary | ICD-10-CM | POA: Diagnosis not present

## 2022-08-19 DIAGNOSIS — R1312 Dysphagia, oropharyngeal phase: Secondary | ICD-10-CM | POA: Diagnosis not present

## 2022-08-19 DIAGNOSIS — I6601 Occlusion and stenosis of right middle cerebral artery: Secondary | ICD-10-CM | POA: Diagnosis not present

## 2022-08-20 DIAGNOSIS — M62562 Muscle wasting and atrophy, not elsewhere classified, left lower leg: Secondary | ICD-10-CM | POA: Diagnosis not present

## 2022-08-20 DIAGNOSIS — I6601 Occlusion and stenosis of right middle cerebral artery: Secondary | ICD-10-CM | POA: Diagnosis not present

## 2022-08-20 DIAGNOSIS — I69352 Hemiplegia and hemiparesis following cerebral infarction affecting left dominant side: Secondary | ICD-10-CM | POA: Diagnosis not present

## 2022-08-20 DIAGNOSIS — R278 Other lack of coordination: Secondary | ICD-10-CM | POA: Diagnosis not present

## 2022-08-20 DIAGNOSIS — R2689 Other abnormalities of gait and mobility: Secondary | ICD-10-CM | POA: Diagnosis not present

## 2022-08-21 ENCOUNTER — Non-Acute Institutional Stay (SKILLED_NURSING_FACILITY): Payer: Medicare Other | Admitting: Internal Medicine

## 2022-08-21 DIAGNOSIS — M25511 Pain in right shoulder: Secondary | ICD-10-CM

## 2022-08-21 DIAGNOSIS — M6389 Disorders of muscle in diseases classified elsewhere, multiple sites: Secondary | ICD-10-CM | POA: Diagnosis not present

## 2022-08-21 DIAGNOSIS — F329 Major depressive disorder, single episode, unspecified: Secondary | ICD-10-CM

## 2022-08-21 DIAGNOSIS — I482 Chronic atrial fibrillation, unspecified: Secondary | ICD-10-CM | POA: Diagnosis not present

## 2022-08-21 DIAGNOSIS — I10 Essential (primary) hypertension: Secondary | ICD-10-CM

## 2022-08-21 DIAGNOSIS — F5101 Primary insomnia: Secondary | ICD-10-CM

## 2022-08-21 DIAGNOSIS — N1831 Chronic kidney disease, stage 3a: Secondary | ICD-10-CM | POA: Diagnosis not present

## 2022-08-21 DIAGNOSIS — I6601 Occlusion and stenosis of right middle cerebral artery: Secondary | ICD-10-CM

## 2022-08-21 DIAGNOSIS — G8929 Other chronic pain: Secondary | ICD-10-CM

## 2022-08-21 DIAGNOSIS — R1312 Dysphagia, oropharyngeal phase: Secondary | ICD-10-CM | POA: Diagnosis not present

## 2022-08-21 DIAGNOSIS — R278 Other lack of coordination: Secondary | ICD-10-CM | POA: Diagnosis not present

## 2022-08-22 ENCOUNTER — Other Ambulatory Visit: Payer: Self-pay

## 2022-08-22 DIAGNOSIS — I6601 Occlusion and stenosis of right middle cerebral artery: Secondary | ICD-10-CM | POA: Diagnosis not present

## 2022-08-22 DIAGNOSIS — M6389 Disorders of muscle in diseases classified elsewhere, multiple sites: Secondary | ICD-10-CM | POA: Diagnosis not present

## 2022-08-22 DIAGNOSIS — R2689 Other abnormalities of gait and mobility: Secondary | ICD-10-CM | POA: Diagnosis not present

## 2022-08-22 DIAGNOSIS — R1312 Dysphagia, oropharyngeal phase: Secondary | ICD-10-CM | POA: Diagnosis not present

## 2022-08-22 DIAGNOSIS — M62562 Muscle wasting and atrophy, not elsewhere classified, left lower leg: Secondary | ICD-10-CM | POA: Diagnosis not present

## 2022-08-22 DIAGNOSIS — R278 Other lack of coordination: Secondary | ICD-10-CM | POA: Diagnosis not present

## 2022-08-22 DIAGNOSIS — I69352 Hemiplegia and hemiparesis following cerebral infarction affecting left dominant side: Secondary | ICD-10-CM | POA: Diagnosis not present

## 2022-08-22 NOTE — Patient Outreach (Signed)
Second telephone outreach attempt to obtain mRS. No answer. Left message for returned call.  Dragan Tamburrino [facility]-Care Management Assistant [phone]  

## 2022-08-23 DIAGNOSIS — M62562 Muscle wasting and atrophy, not elsewhere classified, left lower leg: Secondary | ICD-10-CM | POA: Diagnosis not present

## 2022-08-23 DIAGNOSIS — M6389 Disorders of muscle in diseases classified elsewhere, multiple sites: Secondary | ICD-10-CM | POA: Diagnosis not present

## 2022-08-23 DIAGNOSIS — I6601 Occlusion and stenosis of right middle cerebral artery: Secondary | ICD-10-CM | POA: Diagnosis not present

## 2022-08-23 DIAGNOSIS — R278 Other lack of coordination: Secondary | ICD-10-CM | POA: Diagnosis not present

## 2022-08-23 DIAGNOSIS — R1312 Dysphagia, oropharyngeal phase: Secondary | ICD-10-CM | POA: Diagnosis not present

## 2022-08-23 DIAGNOSIS — R2689 Other abnormalities of gait and mobility: Secondary | ICD-10-CM | POA: Diagnosis not present

## 2022-08-23 DIAGNOSIS — I69352 Hemiplegia and hemiparesis following cerebral infarction affecting left dominant side: Secondary | ICD-10-CM | POA: Diagnosis not present

## 2022-08-24 ENCOUNTER — Other Ambulatory Visit: Payer: Self-pay

## 2022-08-24 DIAGNOSIS — R2689 Other abnormalities of gait and mobility: Secondary | ICD-10-CM | POA: Diagnosis not present

## 2022-08-24 DIAGNOSIS — I69352 Hemiplegia and hemiparesis following cerebral infarction affecting left dominant side: Secondary | ICD-10-CM | POA: Diagnosis not present

## 2022-08-24 DIAGNOSIS — M62562 Muscle wasting and atrophy, not elsewhere classified, left lower leg: Secondary | ICD-10-CM | POA: Diagnosis not present

## 2022-08-24 DIAGNOSIS — I6601 Occlusion and stenosis of right middle cerebral artery: Secondary | ICD-10-CM | POA: Diagnosis not present

## 2022-08-24 DIAGNOSIS — R278 Other lack of coordination: Secondary | ICD-10-CM | POA: Diagnosis not present

## 2022-08-24 DIAGNOSIS — R1312 Dysphagia, oropharyngeal phase: Secondary | ICD-10-CM | POA: Diagnosis not present

## 2022-08-24 DIAGNOSIS — M6389 Disorders of muscle in diseases classified elsewhere, multiple sites: Secondary | ICD-10-CM | POA: Diagnosis not present

## 2022-08-24 NOTE — Patient Outreach (Signed)
3 outreach attempts were completed to obtain mRs. mRs could not be obtained because patient never returned my calls. mRs=7    Jaryn Hocutt Care Management Assistant [phone]  

## 2022-08-24 NOTE — Patient Outreach (Signed)
Telephone outreach to patient's daughter after returned call to obtain mRS was successfully completed. MRS= 5  Vanice Sarah [facility] Care Management Assistant [phone]

## 2022-08-25 DIAGNOSIS — R278 Other lack of coordination: Secondary | ICD-10-CM | POA: Diagnosis not present

## 2022-08-25 DIAGNOSIS — R2689 Other abnormalities of gait and mobility: Secondary | ICD-10-CM | POA: Diagnosis not present

## 2022-08-25 DIAGNOSIS — R1312 Dysphagia, oropharyngeal phase: Secondary | ICD-10-CM | POA: Diagnosis not present

## 2022-08-25 DIAGNOSIS — I6601 Occlusion and stenosis of right middle cerebral artery: Secondary | ICD-10-CM | POA: Diagnosis not present

## 2022-08-25 DIAGNOSIS — I69352 Hemiplegia and hemiparesis following cerebral infarction affecting left dominant side: Secondary | ICD-10-CM | POA: Diagnosis not present

## 2022-08-25 DIAGNOSIS — M62562 Muscle wasting and atrophy, not elsewhere classified, left lower leg: Secondary | ICD-10-CM | POA: Diagnosis not present

## 2022-08-25 DIAGNOSIS — M6389 Disorders of muscle in diseases classified elsewhere, multiple sites: Secondary | ICD-10-CM | POA: Diagnosis not present

## 2022-08-27 ENCOUNTER — Encounter: Payer: Self-pay | Admitting: Internal Medicine

## 2022-08-27 DIAGNOSIS — R278 Other lack of coordination: Secondary | ICD-10-CM | POA: Diagnosis not present

## 2022-08-27 DIAGNOSIS — I6601 Occlusion and stenosis of right middle cerebral artery: Secondary | ICD-10-CM | POA: Diagnosis not present

## 2022-08-27 DIAGNOSIS — M6389 Disorders of muscle in diseases classified elsewhere, multiple sites: Secondary | ICD-10-CM | POA: Diagnosis not present

## 2022-08-27 NOTE — Progress Notes (Signed)
Location: Medical illustrator of Service:  SNF (31)  Provider:   Code Status: Full Code Goals of Care:     05/18/2022    3:00 PM  Advanced Directives  Does Patient Have a Medical Advance Directive? No     Chief Complaint  Patient presents with   Acute Visit    HPI: Patient is a 89 y.o. male seen today for an acute visit for Follow up in Rehab In [facility]  Admitted in the hospital from 02/21-02/29 for Right Middle cerebral Artery Stroke. Patient has a history of A-fib was on Xarelto before the stroke, previous history of right frontal MCA and left parietal watershed infarction with Residual  left-sided weakness in 2015 History of hypertension, hyperlipidemia  Presented to ED with left-sided weakness and fall.  Did not get tPA I CTA M2 occlusion  underwent IR vascularization MRI Showed Large Acute Right Posterior MCA infarct  Xarelto was changed to Eliquis Discharged to inpatient rehab and from there he was discharged to [facility].  Per patient he did not take good therapy there and was transferred [facility].  In the hospital patient also acute right shoulder pain.  Questionable rotator cuff strain x-ray had shown chronic RTC and clinical humeral osteoarthritis he did get glenohumeral joint injection  In [facility] he is making slow progress with therapy Doe shave Left Vision Field Defect Left arm continue to be weak and per nurses not able to  hold or use it Right Arm is also not strong due to his pain in the shoulder  Patient also is emotional and stressed due to his wife has Early Dementia   Past Medical History:  Diagnosis Date   High cholesterol    05/2013; no defecits   Hyperlipemia    Left arm weakness    Left hand weakness    Personal history of colonic polyp - adenoma  10/23/2013   Stroke (HCC) 05/2013   MRI on 07/02/13 = Multifocal acute & subacute infarction involving right frontal MCA/ACA & left parietal MCA/PCA watershed areas     Past Surgical History:  Procedure Laterality Date   IR CT HEAD LTD  05/10/2022   IR PERCUTANEOUS ART THROMBECTOMY/INFUSION INTRACRANIAL INC DIAG ANGIO  05/10/2022   LOOP RECORDER IMPLANT N/A 10/31/2013   Procedure: LOOP RECORDER IMPLANT;  Surgeon: Gardiner Rhyme, MD;  Location: [facility] CATH LAB;  Service: Cardiovascular;  Laterality: N/A;   RADIOLOGY WITH ANESTHESIA N/A 05/10/2022   Procedure: IR WITH ANESTHESIA;  Surgeon: Julieanne Cotton, MD;  Location: [facility] OR;  Service: Radiology;  Laterality: N/A;   SHOULDER SURGERY Bilateral 2003, 1995    No Known Allergies  Outpatient Encounter Medications as of 08/21/2022  Medication Sig   traMADol (ULTRAM) 50 MG tablet Take 0.5 tablets (25 mg total) by mouth every 6 (six) hours as needed for severe pain.   traZODone (DESYREL) 50 MG tablet Take 50 mg by mouth at bedtime.   acetaminophen (TYLENOL) 325 MG tablet Take 2 tablets (650 mg total) by mouth every 4 (four) hours as needed for mild pain (or temp > 37.5 C (99.5 F)).   amLODipine (NORVASC) 10 MG tablet Take 1 tablet (10 mg total) by mouth daily.   apixaban (ELIQUIS) 5 MG TABS tablet Take 1 tablet (5 mg total) by mouth 2 (two) times daily.   atorvastatin (LIPITOR) 20 MG tablet Take 1 tablet (20 mg total) by mouth daily.   docusate sodium (COLACE) 100 MG capsule Take 1 capsule (100  mg total) by mouth daily.   melatonin 3 MG TABS tablet Take 3 mg by mouth at bedtime.   mirtazapine (REMERON SOL-TAB) 15 MG disintegrating tablet Take 1 tablet (15 mg total) by mouth at bedtime. (Patient taking differently: Take 30 mg by mouth at bedtime.)   [DISCONTINUED] metoprolol succinate (TOPROL XL) 25 MG 24 hr tablet Take 1 tablet (25 mg total) by mouth daily. (Patient not taking: Reported on 07/11/2022)   No facility-administered encounter medications on file as of 08/21/2022.    Review of Systems:  Review of Systems  Constitutional:  Positive for activity change. Negative for appetite change and unexpected weight  change.  HENT: Negative.    Respiratory:  Negative for cough and shortness of breath.   Cardiovascular:  Negative for leg swelling.  Gastrointestinal:  Negative for constipation.  Genitourinary:  Negative for frequency.  Musculoskeletal:  Positive for gait problem. Negative for arthralgias and myalgias.  Skin: Negative.  Negative for rash.  Neurological:  Negative for dizziness and weakness.  Psychiatric/Behavioral:  Positive for dysphoric mood. Negative for confusion and sleep disturbance. The patient is nervous/anxious.   All other systems reviewed and are negative.   Health Maintenance  Topic Date Due   DTaP/Tdap/Td (1 - Tdap) Never done   Zoster Vaccines- Shingrix (1 of 2) Never done   Pneumonia Vaccine 67+ Years old (2 of 2 - PCV) 07/07/2014   COVID-19 Vaccine (3 - Pfizer risk series) 06/23/2019   INFLUENZA VACCINE  10/19/2022   Medicare Annual Wellness (AWV)  12/16/2022   HPV VACCINES  Aged Out    Physical Exam: Vitals:   08/21/22 0919  BP: 125/76  Pulse: 81  Resp: 16  Temp: 98.3 F (36.8 C)  Weight: 199 lb (90.3 kg)   Body mass index is 27.37 kg/m. Physical Exam Vitals reviewed.  Constitutional:      Appearance: Normal appearance.  HENT:     Head: Normocephalic.     Nose: Nose normal.     Mouth/Throat:     Mouth: Mucous membranes are moist.     Pharynx: Oropharynx is clear.  Eyes:     Pupils: Pupils are equal, round, and reactive to light.  Cardiovascular:     Rate and Rhythm: Normal rate. Rhythm irregular.     Pulses: Normal pulses.     Heart sounds: No murmur heard. Pulmonary:     Effort: Pulmonary effort is normal. No respiratory distress.     Breath sounds: Normal breath sounds. No rales.  Abdominal:     General: Abdomen is flat. Bowel sounds are normal.     Palpations: Abdomen is soft.  Musculoskeletal:        General: No swelling.     Cervical back: Neck supple.     Comments: Passively able to raise his Shoulder some not more then 90 Degree  due to pain   Skin:    General: Skin is warm.  Neurological:     Mental Status: He is alert and oriented to person, place, and time.     Comments: Has left sided UE weakness 3/5 LE is 4/5  Has Left visual field defect   Psychiatric:        Mood and Affect: Mood normal.        Thought Content: Thought content normal.     Labs reviewed: Basic Metabolic Panel: Recent Labs    05/11/22 1246 05/12/22 0631 05/18/22 0454 05/19/22 0848 05/30/22 0606  NA 135   < > 137 134* 139  K 3.9   < > 3.8 3.7 3.8  CL 103   < > 104 99 104  CO2 27   < > 22 25 25   GLUCOSE 123*   < > 127* 182* 93  BUN 19   < > 18 23 22   CREATININE 1.13   < > 0.98 1.14 1.26*  CALCIUM 8.5*   < > 9.1 9.2 8.9  PHOS 2.4*  --   --   --   --    < > = values in this interval not displayed.   Liver Function Tests: Recent Labs    05/10/22 0005 05/11/22 1246 05/19/22 0848  AST 27  --  33  ALT 20  --  48*  ALKPHOS 93  --  99  BILITOT 0.4  --  1.0  PROT 7.0  --  6.8  ALBUMIN 4.1 3.5 3.6   No results for input(s): "LIPASE", "AMYLASE" in the last 8760 hours. No results for input(s): "AMMONIA" in the last 8760 hours. CBC: Recent Labs    05/10/22 0545 05/11/22 1246 05/18/22 0454 05/19/22 0848 05/30/22 0606  WBC 11.6*   < > 8.1 8.1 7.5  NEUTROABS 9.2*  --   --  5.8 4.7  HGB 16.0   < > 15.7 16.3 15.2  HCT 46.7   < > 44.0 46.0 43.0  MCV 97.3   < > 94.4 96.2 97.1  PLT 164   < > 198 210 149*   < > = values in this interval not displayed.   Lipid Panel: Recent Labs    05/10/22 0545  CHOL 134  HDL 40*  LDLCALC 56  TRIG 161*  CHOLHDL 3.4   Lab Results  Component Value Date   HGBA1C 5.8 (H) 05/10/2022    Procedures since last visit: No results found.  Assessment/Plan 1. Middle cerebral artery embolism, right Continue PT /OT Requires Mechanical Lift  ADL I staff assist  On Eliquis now Continues to need help with his ADLS  2. Chronic right shoulder pain S/P Injected D/W therapy not sure if  another injection will help He continues to have pain and discomfort  Possible Ortho Referral Tylenol PRN for now  3. Chronic a-fib (HCC) On Eliquis  4. Essential hypertension On Norvasc  5. Stage 3a chronic kidney disease (HCC) Repeat Labs  6. Primary insomnia Trazodone  7. Reactive depression Remeron 8 HLD On Statin Lipitor now   Labs/tests ordered:  CBC,CMP, TSH Next appt:  Visit date not found Total time spent in this patient care encounter was  45_  minutes; greater than 50% of the visit spent counseling patient and staff, reviewing records , Labs and coordinating care for problems addressed at this encounter.

## 2022-08-28 ENCOUNTER — Encounter: Payer: Self-pay | Admitting: Internal Medicine

## 2022-08-28 ENCOUNTER — Non-Acute Institutional Stay (SKILLED_NURSING_FACILITY): Payer: Medicare Other | Admitting: Internal Medicine

## 2022-08-28 DIAGNOSIS — F329 Major depressive disorder, single episode, unspecified: Secondary | ICD-10-CM | POA: Diagnosis not present

## 2022-08-28 DIAGNOSIS — I482 Chronic atrial fibrillation, unspecified: Secondary | ICD-10-CM

## 2022-08-28 DIAGNOSIS — G8929 Other chronic pain: Secondary | ICD-10-CM

## 2022-08-28 DIAGNOSIS — I69352 Hemiplegia and hemiparesis following cerebral infarction affecting left dominant side: Secondary | ICD-10-CM | POA: Diagnosis not present

## 2022-08-28 DIAGNOSIS — M6389 Disorders of muscle in diseases classified elsewhere, multiple sites: Secondary | ICD-10-CM | POA: Diagnosis not present

## 2022-08-28 DIAGNOSIS — M62562 Muscle wasting and atrophy, not elsewhere classified, left lower leg: Secondary | ICD-10-CM | POA: Diagnosis not present

## 2022-08-28 DIAGNOSIS — R2689 Other abnormalities of gait and mobility: Secondary | ICD-10-CM | POA: Diagnosis not present

## 2022-08-28 DIAGNOSIS — I6601 Occlusion and stenosis of right middle cerebral artery: Secondary | ICD-10-CM | POA: Diagnosis not present

## 2022-08-28 DIAGNOSIS — M25511 Pain in right shoulder: Secondary | ICD-10-CM | POA: Diagnosis not present

## 2022-08-28 DIAGNOSIS — R1312 Dysphagia, oropharyngeal phase: Secondary | ICD-10-CM | POA: Diagnosis not present

## 2022-08-28 DIAGNOSIS — R278 Other lack of coordination: Secondary | ICD-10-CM | POA: Diagnosis not present

## 2022-08-28 NOTE — Progress Notes (Signed)
Location:  [facility] Room Number: 151A Place of Service:  SNF [phone]) Provider:  Einar Crow.MD  Blair Heys, MD  Patient Care Team: Blair Heys, MD as PCP - General (Family Medicine) Hillis Range, MD (Inactive) as PCP - Electrophysiology (Cardiology) Mealor, Roberts Gaudy, MD as PCP - Cardiology (Cardiology)  Extended Emergency Contact Information Primary Emergency Contact: [facility] Phone: [phone] Relation: Daughter Secondary Emergency Contact: Poch,[name] Address: [phone] [facility] [address]          [place] [address] [place] Mobile Phone: [phone] Relation: Spouse  Code Status:  Full Code  Goals of care: Advanced Directive information    08/28/2022    4:02 PM  Advanced Directives  Does Patient Have a Medical Advance Directive? No  Would patient like information on creating a medical advance directive? No - Patient declined     Chief Complaint  Patient presents with   Acute Visit    Patient is being seen for an acute visit    Immunizations    Patient is due for Pneumonia, Covid , Pneumonia ans shingles vaccine    HPI:  Pt is a 89 y.o. male seen today for an acute visit for Depression  He is [facility] Noticed by Nurses Spontaneous Crying Upset about his wife who has dementia He told me she is saying some things which are very hurtful He is also upset that he is not making any Progress with therapy  Previous History Admitted in the hospital from 02/21-02/29 for Right Middle cerebral Artery Stroke. Patient has a history of A-fib was on Xarelto before the stroke, previous history of right frontal MCA and left parietal watershed infarction with Residual  left-sided weakness in 2015 History of hypertension, hyperlipidemia   Presented to ED with left-sided weakness and fall.  Did not get tPA I CTA M2 occlusion  underwent IR vascularization MRI Showed Large Acute Right Posterior MCA infarct    Xarelto was changed to Eliquis Discharged to inpatient rehab and from there he was discharged to [facility].  Per patient he did not take good therapy there and was transferred to [facility].   In the hospital patient also acute right shoulder pain.  Questionable rotator cuff strain x-ray had shown chronic RTC and clinical humeral osteoarthritis  he did get glenohumeral joint injection   In [facility] he is making slow progress with therapy Does have Left Vision Field Defect Left arm continue to be weak and per nurses not able to  hold or use it  Past Medical History:  Diagnosis Date   High cholesterol    05/2013; no defecits   Hyperlipemia    Left arm weakness    Left hand weakness    Personal history of colonic polyp - adenoma  10/23/2013   Stroke (HCC) 05/2013   MRI on 07/02/13 = Multifocal acute & subacute infarction involving right frontal MCA/ACA & left parietal MCA/PCA watershed areas   Past Surgical History:  Procedure Laterality Date   IR CT HEAD LTD  05/10/2022   IR PERCUTANEOUS ART THROMBECTOMY/INFUSION INTRACRANIAL INC DIAG ANGIO  05/10/2022   LOOP RECORDER IMPLANT N/A 10/31/2013   Procedure: LOOP RECORDER IMPLANT;  Surgeon: Gardiner Rhyme, MD;  [facility]: [facility] CATH LAB;  Service: Cardiovascular;  Laterality: N/A;   RADIOLOGY WITH ANESTHESIA N/A 05/10/2022   Procedure: IR WITH ANESTHESIA;  Surgeon: Julieanne Cotton, MD;  Location: [facility] OR;  Service: Radiology;  Laterality: N/A;   SHOULDER SURGERY Bilateral 2003, 1995    No  Known Allergies  Outpatient Encounter Medications as of 08/28/2022  Medication Sig   acetaminophen (TYLENOL) 325 MG tablet Take 2 tablets (650 mg total) by mouth every 4 (four) hours as needed for mild pain (or temp > 37.5 C (99.5 F)).   amLODipine (NORVASC) 10 MG tablet Take 1 tablet (10 mg total) by mouth daily.   apixaban (ELIQUIS) 5 MG TABS tablet Take 1 tablet (5 mg total) by mouth 2 (two) times daily.   atorvastatin (LIPITOR) 20 MG tablet Take 1 tablet (20 mg  total) by mouth daily.   docusate sodium (COLACE) 100 MG capsule Take 1 capsule (100 mg total) by mouth daily.   melatonin 3 MG TABS tablet Take 3 mg by mouth at bedtime.   mirtazapine (REMERON SOL-TAB) 15 MG disintegrating tablet Take 1 tablet (15 mg total) by mouth at bedtime. (Patient taking differently: Take 30 mg by mouth at bedtime.)   traZODone (DESYREL) 50 MG tablet Take 50 mg by mouth at bedtime.   [DISCONTINUED] traMADol (ULTRAM) 50 MG tablet Take 0.5 tablets (25 mg total) by mouth every 6 (six) hours as needed for severe pain.   traMADol HCl 25 MG TABS Take 25 mg by mouth 2 (two) times daily as needed.   No facility-administered encounter medications on file as of 08/28/2022.    Review of Systems  Constitutional:  Positive for activity change. Negative for appetite change and unexpected weight change.  HENT: Negative.    Respiratory:  Negative for cough and shortness of breath.   Cardiovascular:  Negative for leg swelling.  Gastrointestinal:  Negative for constipation.  Genitourinary:  Negative for frequency.  Musculoskeletal:  Positive for gait problem. Negative for arthralgias and myalgias.  Skin: Negative.  Negative for rash.  Neurological:  Negative for dizziness and weakness.  Psychiatric/Behavioral:  Positive for dysphoric mood. Negative for confusion and sleep disturbance. The patient is nervous/anxious.   All other systems reviewed and are negative.   Immunization History  Administered Date(s) Administered   PFIZER(Purple Top)SARS-COV-2 Vaccination 05/02/2019, 05/26/2019   Pneumococcal Polysaccharide-23 07/06/2013   Pertinent  Health Maintenance Due  Topic Date Due   INFLUENZA VACCINE  10/19/2022      01/18/2015    8:32 AM 07/19/2015    8:07 AM 07/11/2022    2:20 PM  Fall Risk  Falls in the past year? No No 1  Was there an injury with Fall?   0  Fall Risk Category Calculator   1   Functional Status Survey:    Vitals:   08/28/22 1558  BP: 117/70  Pulse:  82  Resp: 18  Temp: (!) 97.2 F (36.2 C)  TempSrc: Temporal  SpO2: 93%  Weight: 199 lb 3.2 oz (90.4 kg)  Height: 5' 11.5" (1.816 m)   Body mass index is 27.4 kg/m. Physical Exam Vitals reviewed.  Constitutional:      Appearance: Normal appearance.  HENT:     Head: Normocephalic.     Nose: Nose normal.     Mouth/Throat:     Mouth: Mucous membranes are moist.     Pharynx: Oropharynx is clear.  Eyes:     Pupils: Pupils are equal, round, and reactive to light.  Cardiovascular:     Rate and Rhythm: Normal rate and regular rhythm.     Pulses: Normal pulses.     Heart sounds: No murmur heard. Pulmonary:     Effort: Pulmonary effort is normal. No respiratory distress.     Breath sounds: Normal breath sounds. No  rales.  Abdominal:     General: Abdomen is flat. Bowel sounds are normal.     Palpations: Abdomen is soft.  Musculoskeletal:        General: No swelling.     Cervical back: Neck supple.  Skin:    General: Skin is warm.  Neurological:     Mental Status: He is alert and oriented to person, place, and time.  Psychiatric:     Comments: Upset Crying      Labs reviewed: Recent Labs    05/11/22 1246 05/12/22 0631 05/18/22 0454 05/19/22 0848 05/30/22 0606  NA 135   < > 137 134* 139  K 3.9   < > 3.8 3.7 3.8  CL 103   < > 104 99 104  CO2 27   < > 22 25 25   GLUCOSE 123*   < > 127* 182* 93  BUN 19   < > 18 23 22   CREATININE 1.13   < > 0.98 1.14 1.26*  CALCIUM 8.5*   < > 9.1 9.2 8.9  PHOS 2.4*  --   --   --   --    < > = values in this interval not displayed.   Recent Labs    05/10/22 0005 05/11/22 1246 05/19/22 0848  AST 27  --  33  ALT 20  --  48*  ALKPHOS 93  --  99  BILITOT 0.4  --  1.0  PROT 7.0  --  6.8  ALBUMIN 4.1 3.5 3.6   Recent Labs    05/10/22 0545 05/11/22 1246 05/18/22 0454 05/19/22 0848 05/30/22 0606  WBC 11.6*   < > 8.1 8.1 7.5  NEUTROABS 9.2*  --   --  5.8 4.7  HGB 16.0   < > 15.7 16.3 15.2  HCT 46.7   < > 44.0 46.0 43.0  MCV  97.3   < > 94.4 96.2 97.1  PLT 164   < > 198 210 149*   < > = values in this interval not displayed.   Lab Results  Component Value Date   TSH 1.51 07/21/2014   Lab Results  Component Value Date   HGBA1C 5.8 (H) 05/10/2022   Lab Results  Component Value Date   CHOL 134 05/10/2022   HDL 40 (L) 05/10/2022   LDLCALC 56 05/10/2022   TRIG 190 (H) 05/10/2022   CHOLHDL 3.4 05/10/2022    Significant Diagnostic Results in last 30 days:  No results found.  Assessment/Plan 1. Reactive depression Will start Lexapro 5 mg QD BMP in 1 week to follow sodium Already on Remeron  2. Middle cerebral artery embolism, right Continue PT /OT Requires Mechanical Lift  ADL I staff assist  On Eliquis now Continues to need help with his ADLS  3. Chronic right shoulder pain S/P Injected D/W therapy not sure if another injection will help He continues to have pain and discomfort  Possible Ortho Referral Tylenol PRN for now  4. Chronic a-fib (HCC) Eliquis 5. Stage 3a chronic kidney disease (HCC) Repeat Labs   6. Primary insomnia Trazodone 7 HTN Norvasc 8 HLD statin  Family/ staff Communication:   Labs/tests ordered:

## 2022-08-29 DIAGNOSIS — R2689 Other abnormalities of gait and mobility: Secondary | ICD-10-CM | POA: Diagnosis not present

## 2022-08-29 DIAGNOSIS — R278 Other lack of coordination: Secondary | ICD-10-CM | POA: Diagnosis not present

## 2022-08-29 DIAGNOSIS — M6389 Disorders of muscle in diseases classified elsewhere, multiple sites: Secondary | ICD-10-CM | POA: Diagnosis not present

## 2022-08-29 DIAGNOSIS — I6601 Occlusion and stenosis of right middle cerebral artery: Secondary | ICD-10-CM | POA: Diagnosis not present

## 2022-08-29 DIAGNOSIS — I69352 Hemiplegia and hemiparesis following cerebral infarction affecting left dominant side: Secondary | ICD-10-CM | POA: Diagnosis not present

## 2022-08-29 DIAGNOSIS — M62562 Muscle wasting and atrophy, not elsewhere classified, left lower leg: Secondary | ICD-10-CM | POA: Diagnosis not present

## 2022-08-30 ENCOUNTER — Encounter: Payer: Self-pay | Admitting: Neurology

## 2022-08-30 ENCOUNTER — Ambulatory Visit: Payer: Medicare Other | Admitting: Neurology

## 2022-08-30 VITALS — BP 127/79 | HR 79 | Ht 72.0 in | Wt 199.8 lb

## 2022-08-30 DIAGNOSIS — R2689 Other abnormalities of gait and mobility: Secondary | ICD-10-CM | POA: Diagnosis not present

## 2022-08-30 DIAGNOSIS — G8114 Spastic hemiplegia affecting left nondominant side: Secondary | ICD-10-CM | POA: Diagnosis not present

## 2022-08-30 DIAGNOSIS — R278 Other lack of coordination: Secondary | ICD-10-CM | POA: Diagnosis not present

## 2022-08-30 DIAGNOSIS — I482 Chronic atrial fibrillation, unspecified: Secondary | ICD-10-CM

## 2022-08-30 DIAGNOSIS — H53462 Homonymous bilateral field defects, left side: Secondary | ICD-10-CM

## 2022-08-30 DIAGNOSIS — I63411 Cerebral infarction due to embolism of right middle cerebral artery: Secondary | ICD-10-CM

## 2022-08-30 DIAGNOSIS — I69352 Hemiplegia and hemiparesis following cerebral infarction affecting left dominant side: Secondary | ICD-10-CM | POA: Diagnosis not present

## 2022-08-30 DIAGNOSIS — M6389 Disorders of muscle in diseases classified elsewhere, multiple sites: Secondary | ICD-10-CM | POA: Diagnosis not present

## 2022-08-30 DIAGNOSIS — M62562 Muscle wasting and atrophy, not elsewhere classified, left lower leg: Secondary | ICD-10-CM | POA: Diagnosis not present

## 2022-08-30 DIAGNOSIS — I6601 Occlusion and stenosis of right middle cerebral artery: Secondary | ICD-10-CM | POA: Diagnosis not present

## 2022-08-30 NOTE — Patient Instructions (Signed)
I had a long d/w patient and his daughter about his recent embolic stroke, atrial fibrillation, spastic left hemiplegia risk for recurrent stroke/TIAs, personally independently reviewed imaging studies and stroke evaluation results and answered questions.Continue Eliquis (apixaban) daily  for secondary stroke prevention and maintain strict control of hypertension with blood pressure goal below 130/90, diabetes with hemoglobin A1c goal below 6.5% and lipids with LDL cholesterol goal below 70 mg/dL. I also advised the patient to eat a healthy diet with plenty of whole grains, cereals, fruits and vegetables, exercise regularly and maintain ideal body weight .continue ongoing physical occupational and speech therapy.  Followup in the future with me only as needed.

## 2022-08-30 NOTE — Progress Notes (Signed)
[facility] [address] [place]. [place] [address] [phone]       OFFICE FOLLOW-UP NOTE  [name] Date of Birth:  [birth year] Medical Record Number:  [id number]   HPI: [name] is a pleasant 89 year old Caucasian male seen today for initial office follow-up visit following hospital admission for stroke in February 2024.  He is accompanied by his daughter.  History is obtained from them and review of electronic medical records.  I personally reviewed pertinent available imaging films in PACS.  He has a past medical history of chronic atrial fibrillation, hyperlipidemia, prior right frontal MCA/ACA and left parietal embolic infarct 2130 with residual left arm and hand weakness.  Hyperlipidemia.  He presented on 05/10/2022 after falling at home the evening.Family noticing left-sided weakness and inability to get up.  He is also on Xarelto at home for his atrial fibrillation and has been compliant with it.  CT head on admission showed no acute abnormality.  CT angiogram of the head showed right M2 proximal occlusion.  Patient was outside the window for TNK and was already on Xarelto.  He was taken for mechanical thrombectomy and underwent TICI 2c revascularization.  MRI scan however showed a large right MCA infarct and MRI showed reocclusion of the right M2.  2D echo showed ejection fraction of 60 to 65%.  LDL cholesterol 56 mg percent.  Hemoglobin A1c was 5.8.  Patient Xarelto was switched to Eliquis.  Patient was seen by physical occupational speech therapy and transferred to inpatient rehab.  Patient did not make significant progress and has been transferred to skilled nursing facility.  He is still getting physical occupational and speech therapy.  Still has left-sided weakness but is able to lift his left arm and leg of gravity.  He still however is not able to walk.He has poor coordination and balance with limits his ability to walk even with assistance of therapist.  He  is tolerating Eliquis well with minor bruising but no bleeding.  Patient's wife also has dementia.  Is unlikely that he is going to be able to go back home ROS:   14 system review of systems is positive for weakness, coordination difficulty, walking difficulty decreased hearing, bruising all other systems negative  PMH:  Past Medical History:  Diagnosis Date   High cholesterol    05/2013; no defecits   Hyperlipemia    Left arm weakness    Left hand weakness    Personal history of colonic polyp - adenoma  10/23/2013   Stroke (HCC) 05/2013   MRI on 07/02/13 = Multifocal acute & subacute infarction involving right frontal MCA/ACA & left parietal MCA/PCA watershed areas    Social History:  Social History   Socioeconomic History   Marital status: Married    Spouse name: lea   Number of children: 2   Years of education: college   Highest education level: Not on file  Occupational History   Occupation: retired  Tobacco Use   Smoking status: Former    Types: Cigarettes    Quit date: 03/20/1970    Years since quitting: 52.4   Smokeless tobacco: Never  Vaping Use   Vaping Use: Never used  Substance and Sexual Activity   Alcohol use: No   Drug use: No   Sexual activity: Yes  Other Topics Concern   Not on file  Social History Narrative   Patient right handed   Patient lives with his wife   Patient coffee daily  Enjoys playing golf   Social Determinants of Corporate investment banker Strain: Not on file  Food Insecurity: Not on file  Transportation Needs: Not on file  Physical Activity: Not on file  Stress: Not on file  Social Connections: Not on file  Intimate Partner Violence: Not on file    Medications:   Current Outpatient Medications on File Prior to Visit  Medication Sig Dispense Refill   acetaminophen (TYLENOL) 325 MG tablet Take 2 tablets (650 mg total) by mouth every 4 (four) hours as needed for mild pain (or temp > 37.5 C (99.5 F)).     amLODipine (NORVASC) 10 MG  tablet Take 1 tablet (10 mg total) by mouth daily. 30 tablet 0   apixaban (ELIQUIS) 5 MG TABS tablet Take 1 tablet (5 mg total) by mouth 2 (two) times daily. 60 tablet 0   atorvastatin (LIPITOR) 20 MG tablet Take 1 tablet (20 mg total) by mouth daily. 30 tablet 0   docusate sodium (COLACE) 100 MG capsule Take 1 capsule (100 mg total) by mouth daily. 10 capsule 0   escitalopram (LEXAPRO) 5 MG tablet Take 5 mg by mouth daily.     melatonin 3 MG TABS tablet Take 3 mg by mouth at bedtime.     mirtazapine (REMERON SOL-TAB) 15 MG disintegrating tablet Take 1 tablet (15 mg total) by mouth at bedtime. (Patient taking differently: Take 30 mg by mouth at bedtime.)     simvastatin (ZOCOR) 40 MG tablet Take 40 mg by mouth at bedtime.     traZODone (DESYREL) 50 MG tablet Take 50 mg by mouth at bedtime.     No current facility-administered medications on file prior to visit.    Allergies:  No Known Allergies  Physical Exam General: well developed, well nourished pleasant elderly Caucasian male, seated, in no evident distress Head: head normocephalic and atraumatic.  Neck: supple with no carotid or supraclavicular bruits Cardiovascular: regular rate and rhythm, no murmurs Musculoskeletal: no deformity Skin:  no rash/petichiae Vascular:  Normal pulses all extremities Vitals:   08/30/22 0847  BP: 127/79  Pulse: 79   Neurologic Exam Mental Status: Awake and fully alert. Oriented to place and time. Recent and remote memory intact. Attention span, concentration and fund of knowledge appropriate. Mood and affect appropriate.  Cranial Nerves: Fundoscopic exam reveals sharp disc margins. Pupils equal, briskly reactive to light. Extraocular movements full without nystagmus. Visual fields show partial left homonymous hemianopsia to confrontation. Hearing diminished bilaterally. Facial sensation intact. Face, tongue, palate moves normally and symmetrically.  Motor: Spastic left hemiparesis with 3/5 strength in  the left upper extremity with significant weakness of right grip and intrinsic hand muscles and fingers.  4/5 strength in the left lower extremity proximally.  Mild left upper and lower extremity drift Left foot drop with ankle dorsiflexor weakness.  Tone is increased on the left compared to the right.   Sensory.:  Diminished touch ,pinprick .position and vibratory sensation on left lower face and arm and leg Coordination: Rapid alternating movements normal in all extremities. Finger-to-nose and heel-to-shin performed accurately bilaterally. Gait and Station: Deferred as patient is a wheelchair and not able to walk  reflexes: 2+ and asymmetric and brisker on the left. Toes downgoing.   NIHSS  4 Modified Rankin  4   ASSESSMENT: 89 year old Caucasian male with embolic right MCA infarct in February 2024 due to right M2 occlusion status post mechanical thrombectomy with reocclusion from atrial fibrillation despite anticoagulation.  Vascular risk factors of  chronic atrial fibrillation, hypertension and hyperlipidemia.     PLAN:I had a long d/w patient and his daughter about his recent embolic stroke, atrial fibrillation, spastic left hemiplegia risk for recurrent stroke/TIAs, personally independently reviewed imaging studies and stroke evaluation results and answered questions.Continue Eliquis (apixaban) daily  for secondary stroke prevention and maintain strict control of hypertension with blood pressure goal below 130/90, diabetes with hemoglobin A1c goal below 6.5% and lipids with LDL cholesterol goal below 70 mg/dL. I also advised the patient to eat a healthy diet with plenty of whole grains, cereals, fruits and vegetables, exercise regularly and maintain ideal body weight .continue ongoing physical occupational and speech therapy.  Followup in the future with me only as needed. Greater than 50% of time during this 35 minute visit was spent on counseling,explanation of diagnosis of stroke, atrial  fibrillation, planning of further management, discussion with patient and family and coordination of care Delia Heady, MD Note: This document was prepared with digital dictation and possible smart phrase technology. Any transcriptional errors that result from this process are unintentional

## 2022-08-31 DIAGNOSIS — I6601 Occlusion and stenosis of right middle cerebral artery: Secondary | ICD-10-CM | POA: Diagnosis not present

## 2022-08-31 DIAGNOSIS — M62562 Muscle wasting and atrophy, not elsewhere classified, left lower leg: Secondary | ICD-10-CM | POA: Diagnosis not present

## 2022-08-31 DIAGNOSIS — R278 Other lack of coordination: Secondary | ICD-10-CM | POA: Diagnosis not present

## 2022-08-31 DIAGNOSIS — R1312 Dysphagia, oropharyngeal phase: Secondary | ICD-10-CM | POA: Diagnosis not present

## 2022-08-31 DIAGNOSIS — R2689 Other abnormalities of gait and mobility: Secondary | ICD-10-CM | POA: Diagnosis not present

## 2022-08-31 DIAGNOSIS — I69352 Hemiplegia and hemiparesis following cerebral infarction affecting left dominant side: Secondary | ICD-10-CM | POA: Diagnosis not present

## 2022-09-01 DIAGNOSIS — R2689 Other abnormalities of gait and mobility: Secondary | ICD-10-CM | POA: Diagnosis not present

## 2022-09-01 DIAGNOSIS — I4891 Unspecified atrial fibrillation: Secondary | ICD-10-CM | POA: Diagnosis not present

## 2022-09-01 DIAGNOSIS — R278 Other lack of coordination: Secondary | ICD-10-CM | POA: Diagnosis not present

## 2022-09-01 DIAGNOSIS — I69352 Hemiplegia and hemiparesis following cerebral infarction affecting left dominant side: Secondary | ICD-10-CM | POA: Diagnosis not present

## 2022-09-01 DIAGNOSIS — M62562 Muscle wasting and atrophy, not elsewhere classified, left lower leg: Secondary | ICD-10-CM | POA: Diagnosis not present

## 2022-09-01 DIAGNOSIS — M6389 Disorders of muscle in diseases classified elsewhere, multiple sites: Secondary | ICD-10-CM | POA: Diagnosis not present

## 2022-09-01 DIAGNOSIS — R1312 Dysphagia, oropharyngeal phase: Secondary | ICD-10-CM | POA: Diagnosis not present

## 2022-09-01 DIAGNOSIS — I6601 Occlusion and stenosis of right middle cerebral artery: Secondary | ICD-10-CM | POA: Diagnosis not present

## 2022-09-01 LAB — CBC: RBC: 4.2 (ref 3.87–5.11)

## 2022-09-01 LAB — HEPATIC FUNCTION PANEL
ALT: 23 U/L (ref 10–40)
AST: 21 (ref 14–40)
Alkaline Phosphatase: 128 — AB (ref 25–125)
Bilirubin, Total: 0.3

## 2022-09-01 LAB — COMPREHENSIVE METABOLIC PANEL
Albumin: 3.6 (ref 3.5–5.0)
Calcium: 8.7 (ref 8.7–10.7)
Globulin: 2
eGFR: 60

## 2022-09-01 LAB — BASIC METABOLIC PANEL
BUN: 20 (ref 4–21)
CO2: 22 (ref 13–22)
Chloride: 106 (ref 99–108)
Creatinine: 1.2 (ref 0.6–1.3)
Glucose: 106
Potassium: 3.8 mEq/L (ref 3.5–5.1)
Sodium: 141 (ref 137–147)

## 2022-09-01 LAB — CBC AND DIFFERENTIAL
HCT: 41 (ref 41–53)
Hemoglobin: 14 (ref 13.5–17.5)
Platelets: 150 10*3/uL (ref 150–400)
WBC: 6

## 2022-09-01 LAB — TSH: TSH: 2.23 (ref 0.41–5.90)

## 2022-09-04 DIAGNOSIS — R278 Other lack of coordination: Secondary | ICD-10-CM | POA: Diagnosis not present

## 2022-09-04 DIAGNOSIS — M6389 Disorders of muscle in diseases classified elsewhere, multiple sites: Secondary | ICD-10-CM | POA: Diagnosis not present

## 2022-09-04 DIAGNOSIS — I69352 Hemiplegia and hemiparesis following cerebral infarction affecting left dominant side: Secondary | ICD-10-CM | POA: Diagnosis not present

## 2022-09-04 DIAGNOSIS — R2689 Other abnormalities of gait and mobility: Secondary | ICD-10-CM | POA: Diagnosis not present

## 2022-09-04 DIAGNOSIS — M62562 Muscle wasting and atrophy, not elsewhere classified, left lower leg: Secondary | ICD-10-CM | POA: Diagnosis not present

## 2022-09-04 DIAGNOSIS — I6601 Occlusion and stenosis of right middle cerebral artery: Secondary | ICD-10-CM | POA: Diagnosis not present

## 2022-09-04 DIAGNOSIS — R1312 Dysphagia, oropharyngeal phase: Secondary | ICD-10-CM | POA: Diagnosis not present

## 2022-09-05 DIAGNOSIS — R278 Other lack of coordination: Secondary | ICD-10-CM | POA: Diagnosis not present

## 2022-09-05 DIAGNOSIS — R2689 Other abnormalities of gait and mobility: Secondary | ICD-10-CM | POA: Diagnosis not present

## 2022-09-05 DIAGNOSIS — M6389 Disorders of muscle in diseases classified elsewhere, multiple sites: Secondary | ICD-10-CM | POA: Diagnosis not present

## 2022-09-05 DIAGNOSIS — M62562 Muscle wasting and atrophy, not elsewhere classified, left lower leg: Secondary | ICD-10-CM | POA: Diagnosis not present

## 2022-09-05 DIAGNOSIS — I6601 Occlusion and stenosis of right middle cerebral artery: Secondary | ICD-10-CM | POA: Diagnosis not present

## 2022-09-05 DIAGNOSIS — I69352 Hemiplegia and hemiparesis following cerebral infarction affecting left dominant side: Secondary | ICD-10-CM | POA: Diagnosis not present

## 2022-09-06 DIAGNOSIS — M6389 Disorders of muscle in diseases classified elsewhere, multiple sites: Secondary | ICD-10-CM | POA: Diagnosis not present

## 2022-09-06 DIAGNOSIS — I69352 Hemiplegia and hemiparesis following cerebral infarction affecting left dominant side: Secondary | ICD-10-CM | POA: Diagnosis not present

## 2022-09-06 DIAGNOSIS — R2689 Other abnormalities of gait and mobility: Secondary | ICD-10-CM | POA: Diagnosis not present

## 2022-09-06 DIAGNOSIS — R1312 Dysphagia, oropharyngeal phase: Secondary | ICD-10-CM | POA: Diagnosis not present

## 2022-09-06 DIAGNOSIS — R278 Other lack of coordination: Secondary | ICD-10-CM | POA: Diagnosis not present

## 2022-09-06 DIAGNOSIS — I6601 Occlusion and stenosis of right middle cerebral artery: Secondary | ICD-10-CM | POA: Diagnosis not present

## 2022-09-06 DIAGNOSIS — M62562 Muscle wasting and atrophy, not elsewhere classified, left lower leg: Secondary | ICD-10-CM | POA: Diagnosis not present

## 2022-09-07 DIAGNOSIS — R278 Other lack of coordination: Secondary | ICD-10-CM | POA: Diagnosis not present

## 2022-09-07 DIAGNOSIS — I6601 Occlusion and stenosis of right middle cerebral artery: Secondary | ICD-10-CM | POA: Diagnosis not present

## 2022-09-07 DIAGNOSIS — M62562 Muscle wasting and atrophy, not elsewhere classified, left lower leg: Secondary | ICD-10-CM | POA: Diagnosis not present

## 2022-09-07 DIAGNOSIS — R2689 Other abnormalities of gait and mobility: Secondary | ICD-10-CM | POA: Diagnosis not present

## 2022-09-07 DIAGNOSIS — I69352 Hemiplegia and hemiparesis following cerebral infarction affecting left dominant side: Secondary | ICD-10-CM | POA: Diagnosis not present

## 2022-09-07 DIAGNOSIS — M6389 Disorders of muscle in diseases classified elsewhere, multiple sites: Secondary | ICD-10-CM | POA: Diagnosis not present

## 2022-09-08 DIAGNOSIS — I69352 Hemiplegia and hemiparesis following cerebral infarction affecting left dominant side: Secondary | ICD-10-CM | POA: Diagnosis not present

## 2022-09-08 DIAGNOSIS — R1312 Dysphagia, oropharyngeal phase: Secondary | ICD-10-CM | POA: Diagnosis not present

## 2022-09-08 DIAGNOSIS — M6389 Disorders of muscle in diseases classified elsewhere, multiple sites: Secondary | ICD-10-CM | POA: Diagnosis not present

## 2022-09-08 DIAGNOSIS — R278 Other lack of coordination: Secondary | ICD-10-CM | POA: Diagnosis not present

## 2022-09-08 DIAGNOSIS — I6601 Occlusion and stenosis of right middle cerebral artery: Secondary | ICD-10-CM | POA: Diagnosis not present

## 2022-09-08 DIAGNOSIS — M62562 Muscle wasting and atrophy, not elsewhere classified, left lower leg: Secondary | ICD-10-CM | POA: Diagnosis not present

## 2022-09-08 DIAGNOSIS — R2689 Other abnormalities of gait and mobility: Secondary | ICD-10-CM | POA: Diagnosis not present

## 2022-09-11 DIAGNOSIS — M62562 Muscle wasting and atrophy, not elsewhere classified, left lower leg: Secondary | ICD-10-CM | POA: Diagnosis not present

## 2022-09-11 DIAGNOSIS — I6601 Occlusion and stenosis of right middle cerebral artery: Secondary | ICD-10-CM | POA: Diagnosis not present

## 2022-09-11 DIAGNOSIS — M6389 Disorders of muscle in diseases classified elsewhere, multiple sites: Secondary | ICD-10-CM | POA: Diagnosis not present

## 2022-09-11 DIAGNOSIS — I69352 Hemiplegia and hemiparesis following cerebral infarction affecting left dominant side: Secondary | ICD-10-CM | POA: Diagnosis not present

## 2022-09-11 DIAGNOSIS — R1312 Dysphagia, oropharyngeal phase: Secondary | ICD-10-CM | POA: Diagnosis not present

## 2022-09-11 DIAGNOSIS — R2689 Other abnormalities of gait and mobility: Secondary | ICD-10-CM | POA: Diagnosis not present

## 2022-09-11 DIAGNOSIS — R278 Other lack of coordination: Secondary | ICD-10-CM | POA: Diagnosis not present

## 2022-09-12 DIAGNOSIS — M6389 Disorders of muscle in diseases classified elsewhere, multiple sites: Secondary | ICD-10-CM | POA: Diagnosis not present

## 2022-09-12 DIAGNOSIS — R2689 Other abnormalities of gait and mobility: Secondary | ICD-10-CM | POA: Diagnosis not present

## 2022-09-12 DIAGNOSIS — R278 Other lack of coordination: Secondary | ICD-10-CM | POA: Diagnosis not present

## 2022-09-12 DIAGNOSIS — I69352 Hemiplegia and hemiparesis following cerebral infarction affecting left dominant side: Secondary | ICD-10-CM | POA: Diagnosis not present

## 2022-09-12 DIAGNOSIS — M62562 Muscle wasting and atrophy, not elsewhere classified, left lower leg: Secondary | ICD-10-CM | POA: Diagnosis not present

## 2022-09-12 DIAGNOSIS — I6601 Occlusion and stenosis of right middle cerebral artery: Secondary | ICD-10-CM | POA: Diagnosis not present

## 2022-09-13 DIAGNOSIS — M6389 Disorders of muscle in diseases classified elsewhere, multiple sites: Secondary | ICD-10-CM | POA: Diagnosis not present

## 2022-09-13 DIAGNOSIS — R1312 Dysphagia, oropharyngeal phase: Secondary | ICD-10-CM | POA: Diagnosis not present

## 2022-09-13 DIAGNOSIS — R2689 Other abnormalities of gait and mobility: Secondary | ICD-10-CM | POA: Diagnosis not present

## 2022-09-13 DIAGNOSIS — I69352 Hemiplegia and hemiparesis following cerebral infarction affecting left dominant side: Secondary | ICD-10-CM | POA: Diagnosis not present

## 2022-09-13 DIAGNOSIS — M62562 Muscle wasting and atrophy, not elsewhere classified, left lower leg: Secondary | ICD-10-CM | POA: Diagnosis not present

## 2022-09-13 DIAGNOSIS — R278 Other lack of coordination: Secondary | ICD-10-CM | POA: Diagnosis not present

## 2022-09-13 DIAGNOSIS — I6601 Occlusion and stenosis of right middle cerebral artery: Secondary | ICD-10-CM | POA: Diagnosis not present

## 2022-09-14 ENCOUNTER — Telehealth: Payer: Self-pay | Admitting: Adult Health

## 2022-09-14 DIAGNOSIS — R278 Other lack of coordination: Secondary | ICD-10-CM | POA: Diagnosis not present

## 2022-09-14 DIAGNOSIS — M6389 Disorders of muscle in diseases classified elsewhere, multiple sites: Secondary | ICD-10-CM | POA: Diagnosis not present

## 2022-09-14 DIAGNOSIS — I6601 Occlusion and stenosis of right middle cerebral artery: Secondary | ICD-10-CM | POA: Diagnosis not present

## 2022-09-14 DIAGNOSIS — R1312 Dysphagia, oropharyngeal phase: Secondary | ICD-10-CM | POA: Diagnosis not present

## 2022-09-14 NOTE — Telephone Encounter (Signed)
Nurse from [facility] contacted me to let me know Edward Maynard continues to have depression. This is affecting his ability to progress. He was started on Lexapro 5 mg two weeks ago and has not noticed a different. I think overall he will need more time but I am ok to go up to 10 mg and recheck his BMP in two weeks.

## 2022-09-15 DIAGNOSIS — R2689 Other abnormalities of gait and mobility: Secondary | ICD-10-CM | POA: Diagnosis not present

## 2022-09-15 DIAGNOSIS — M62562 Muscle wasting and atrophy, not elsewhere classified, left lower leg: Secondary | ICD-10-CM | POA: Diagnosis not present

## 2022-09-15 DIAGNOSIS — R278 Other lack of coordination: Secondary | ICD-10-CM | POA: Diagnosis not present

## 2022-09-15 DIAGNOSIS — I6601 Occlusion and stenosis of right middle cerebral artery: Secondary | ICD-10-CM | POA: Diagnosis not present

## 2022-09-15 DIAGNOSIS — I69352 Hemiplegia and hemiparesis following cerebral infarction affecting left dominant side: Secondary | ICD-10-CM | POA: Diagnosis not present

## 2022-09-15 DIAGNOSIS — M6389 Disorders of muscle in diseases classified elsewhere, multiple sites: Secondary | ICD-10-CM | POA: Diagnosis not present

## 2022-09-18 ENCOUNTER — Non-Acute Institutional Stay (SKILLED_NURSING_FACILITY): Payer: Medicare Other | Admitting: Adult Health

## 2022-09-18 ENCOUNTER — Encounter: Payer: Self-pay | Admitting: Adult Health

## 2022-09-18 DIAGNOSIS — R278 Other lack of coordination: Secondary | ICD-10-CM | POA: Diagnosis not present

## 2022-09-18 DIAGNOSIS — M6389 Disorders of muscle in diseases classified elsewhere, multiple sites: Secondary | ICD-10-CM | POA: Diagnosis not present

## 2022-09-18 DIAGNOSIS — R6889 Other general symptoms and signs: Secondary | ICD-10-CM | POA: Diagnosis not present

## 2022-09-18 DIAGNOSIS — I69352 Hemiplegia and hemiparesis following cerebral infarction affecting left dominant side: Secondary | ICD-10-CM | POA: Diagnosis not present

## 2022-09-18 DIAGNOSIS — R1312 Dysphagia, oropharyngeal phase: Secondary | ICD-10-CM | POA: Diagnosis not present

## 2022-09-18 DIAGNOSIS — I6601 Occlusion and stenosis of right middle cerebral artery: Secondary | ICD-10-CM | POA: Diagnosis not present

## 2022-09-18 DIAGNOSIS — M25511 Pain in right shoulder: Secondary | ICD-10-CM

## 2022-09-18 DIAGNOSIS — R2689 Other abnormalities of gait and mobility: Secondary | ICD-10-CM | POA: Diagnosis not present

## 2022-09-18 DIAGNOSIS — F329 Major depressive disorder, single episode, unspecified: Secondary | ICD-10-CM | POA: Diagnosis not present

## 2022-09-18 DIAGNOSIS — M62562 Muscle wasting and atrophy, not elsewhere classified, left lower leg: Secondary | ICD-10-CM | POA: Diagnosis not present

## 2022-09-18 DIAGNOSIS — G8929 Other chronic pain: Secondary | ICD-10-CM

## 2022-09-18 NOTE — Progress Notes (Unsigned)
Location:  [facility] Room Number: 151A Place of Service:  SNF ([phone]) Provider:  Lynda Rainwater, MD  Patient Care Team: Irven Coe, MD as PCP - General (Family Medicine) Hillis Range, MD (Inactive) as PCP - Electrophysiology (Cardiology) Mealor, Roberts Gaudy, MD as PCP - Cardiology (Cardiology)  Extended Emergency Contact Information Primary Emergency Contact: [facility] Phone: [phone] Relation: Daughter Secondary Emergency Contact: Klink,[name] Address: [address]          [place] [address] [place] Mobile Phone: [phone] Relation: Spouse  Code Status:  Full code Goals of care: Advanced Directive information    09/18/2022   10:20 AM  Advanced Directives  Does Patient Have a Medical Advance Directive? No  Would patient like information on creating a medical advance directive? No - Patient declined     Chief Complaint  Patient presents with  . Acute Visit    Patient is being seen for  vivid dreams    HPI:  Pt is a 89 y.o. male seen today for an acute visit for vivid dream/fall   He was started on Lexapro for depression on 6/10.  Dose increased to 10 mg 6/27.     Past Medical History:  Diagnosis Date  . High cholesterol    05/2013; no defecits  . Hyperlipemia   . Left arm weakness   . Left hand weakness   . Personal history of colonic polyp - adenoma  10/23/2013  . Stroke [facility]) 05/2013   MRI on 07/02/13 = Multifocal acute & subacute infarction involving right frontal MCA/ACA & left parietal MCA/PCA watershed areas   Past Surgical History:  Procedure Laterality Date  . IR CT HEAD LTD  05/10/2022  . IR PERCUTANEOUS ART THROMBECTOMY/INFUSION INTRACRANIAL INC DIAG ANGIO  05/10/2022  . LOOP RECORDER IMPLANT N/A 10/31/2013   Procedure: LOOP RECORDER IMPLANT;  Surgeon: Gardiner Rhyme, MD;  [facility]: [facility] CATH LAB;  Service: Cardiovascular;  Laterality: N/A;  . RADIOLOGY WITH  ANESTHESIA N/A 05/10/2022   Procedure: IR WITH ANESTHESIA;  Surgeon: Julieanne Cotton, MD;  Location: [facility] OR;  Service: Radiology;  Laterality: N/A;  . SHOULDER SURGERY Bilateral 2003, 1995    No Known Allergies  Outpatient Encounter Medications as of 09/18/2022  Medication Sig  . acetaminophen (TYLENOL) 325 MG tablet Take 2 tablets (650 mg total) by mouth every 4 (four) hours as needed for mild pain (or temp > 37.5 C (99.5 F)).  Marland Kitchen amLODipine (NORVASC) 10 MG tablet Take 1 tablet (10 mg total) by mouth daily.  Marland Kitchen apixaban (ELIQUIS) 5 MG TABS tablet Take 1 tablet (5 mg total) by mouth 2 (two) times daily.  Marland Kitchen atorvastatin (LIPITOR) 20 MG tablet Take 1 tablet (20 mg total) by mouth daily.  Marland Kitchen docusate sodium (COLACE) 100 MG capsule Take 1 capsule (100 mg total) by mouth daily.  Marland Kitchen escitalopram (LEXAPRO) 5 MG tablet Take 10 mg by mouth daily.  . mirtazapine (REMERON SOL-TAB) 15 MG disintegrating tablet Take 1 tablet (15 mg total) by mouth at bedtime. (Patient taking differently: Take 30 mg by mouth at bedtime.)  . traZODone (DESYREL) 50 MG tablet Take 50 mg by mouth at bedtime.  . melatonin 3 MG TABS tablet Take 3 mg by mouth at bedtime.  . simvastatin (ZOCOR) 40 MG tablet Take 40 mg by mouth at bedtime.   No facility-administered encounter medications on file as of 09/18/2022.    Review of Systems  Immunization History  Administered Date(s) Administered  .  PFIZER(Purple Top)SARS-COV-2 Vaccination 05/02/2019, 05/26/2019  . Pneumococcal Polysaccharide-23 07/06/2013   Pertinent  Health Maintenance Due  Topic Date Due  . INFLUENZA VACCINE  10/19/2022      01/18/2015    8:32 AM 07/19/2015    8:07 AM 07/11/2022    2:20 PM  Fall Risk  Falls in the past year? No No 1  Was there an injury with Fall?   0  Fall Risk Category Calculator   1   Functional Status Survey:    Vitals:   09/18/22 1016  BP: 128/66  Pulse: 87  Resp: 14  Temp: (!) 97.3 F (36.3 C)  TempSrc: Temporal  SpO2: 94%   Weight: 203 lb (92.1 kg)  Height: 6' (1.829 m)   Body mass index is 27.53 kg/m. Physical Exam  Labs reviewed: Recent Labs    05/11/22 1246 05/12/22 0631 05/18/22 0454 05/19/22 0848 05/30/22 0606  NA 135   < > 137 134* 139  K 3.9   < > 3.8 3.7 3.8  CL 103   < > 104 99 104  CO2 27   < > 22 25 25   GLUCOSE 123*   < > 127* 182* 93  BUN 19   < > 18 23 22   CREATININE 1.13   < > 0.98 1.14 1.26*  CALCIUM 8.5*   < > 9.1 9.2 8.9  PHOS 2.4*  --   --   --   --    < > = values in this interval not displayed.   Recent Labs    05/10/22 0005 05/11/22 1246 05/19/22 0848  AST 27  --  33  ALT 20  --  48*  ALKPHOS 93  --  99  BILITOT 0.4  --  1.0  PROT 7.0  --  6.8  ALBUMIN 4.1 3.5 3.6   Recent Labs    05/10/22 0545 05/11/22 1246 05/18/22 0454 05/19/22 0848 05/30/22 0606  WBC 11.6*   < > 8.1 8.1 7.5  NEUTROABS 9.2*  --   --  5.8 4.7  HGB 16.0   < > 15.7 16.3 15.2  HCT 46.7   < > 44.0 46.0 43.0  MCV 97.3   < > 94.4 96.2 97.1  PLT 164   < > 198 210 149*   < > = values in this interval not displayed.   Lab Results  Component Value Date   TSH 1.51 07/21/2014   Lab Results  Component Value Date   HGBA1C 5.8 (H) 05/10/2022   Lab Results  Component Value Date   CHOL 134 05/10/2022   HDL 40 (L) 05/10/2022   LDLCALC 56 05/10/2022   TRIG 190 (H) 05/10/2022   CHOLHDL 3.4 05/10/2022    Significant Diagnostic Results in last 30 days:  No results found.  Assessment/Plan There are no diagnoses linked to this encounter.   Family/ staff Communication: ***  Labs/tests ordered:  ***

## 2022-09-18 NOTE — Progress Notes (Signed)
abstraction

## 2022-09-19 DIAGNOSIS — M6389 Disorders of muscle in diseases classified elsewhere, multiple sites: Secondary | ICD-10-CM | POA: Diagnosis not present

## 2022-09-19 DIAGNOSIS — I69352 Hemiplegia and hemiparesis following cerebral infarction affecting left dominant side: Secondary | ICD-10-CM | POA: Diagnosis not present

## 2022-09-19 DIAGNOSIS — I6601 Occlusion and stenosis of right middle cerebral artery: Secondary | ICD-10-CM | POA: Diagnosis not present

## 2022-09-19 DIAGNOSIS — R2689 Other abnormalities of gait and mobility: Secondary | ICD-10-CM | POA: Diagnosis not present

## 2022-09-19 DIAGNOSIS — M62562 Muscle wasting and atrophy, not elsewhere classified, left lower leg: Secondary | ICD-10-CM | POA: Diagnosis not present

## 2022-09-19 DIAGNOSIS — R278 Other lack of coordination: Secondary | ICD-10-CM | POA: Diagnosis not present

## 2022-09-20 ENCOUNTER — Encounter: Payer: Self-pay | Admitting: Adult Health

## 2022-09-20 DIAGNOSIS — I69352 Hemiplegia and hemiparesis following cerebral infarction affecting left dominant side: Secondary | ICD-10-CM | POA: Diagnosis not present

## 2022-09-20 DIAGNOSIS — R1312 Dysphagia, oropharyngeal phase: Secondary | ICD-10-CM | POA: Diagnosis not present

## 2022-09-20 DIAGNOSIS — R278 Other lack of coordination: Secondary | ICD-10-CM | POA: Diagnosis not present

## 2022-09-20 DIAGNOSIS — I6601 Occlusion and stenosis of right middle cerebral artery: Secondary | ICD-10-CM | POA: Diagnosis not present

## 2022-09-20 DIAGNOSIS — M6389 Disorders of muscle in diseases classified elsewhere, multiple sites: Secondary | ICD-10-CM | POA: Diagnosis not present

## 2022-09-20 DIAGNOSIS — M62562 Muscle wasting and atrophy, not elsewhere classified, left lower leg: Secondary | ICD-10-CM | POA: Diagnosis not present

## 2022-09-20 DIAGNOSIS — R2689 Other abnormalities of gait and mobility: Secondary | ICD-10-CM | POA: Diagnosis not present

## 2022-09-20 DIAGNOSIS — M25511 Pain in right shoulder: Secondary | ICD-10-CM | POA: Diagnosis not present

## 2022-09-20 MED ORDER — MIRTAZAPINE 15 MG PO TBDP: 15.0000 mg | ORAL_TABLET | Freq: Every day | ORAL | Status: DC

## 2022-09-21 DIAGNOSIS — M62562 Muscle wasting and atrophy, not elsewhere classified, left lower leg: Secondary | ICD-10-CM | POA: Diagnosis not present

## 2022-09-21 DIAGNOSIS — I69352 Hemiplegia and hemiparesis following cerebral infarction affecting left dominant side: Secondary | ICD-10-CM | POA: Diagnosis not present

## 2022-09-21 DIAGNOSIS — R2689 Other abnormalities of gait and mobility: Secondary | ICD-10-CM | POA: Diagnosis not present

## 2022-09-21 DIAGNOSIS — I6601 Occlusion and stenosis of right middle cerebral artery: Secondary | ICD-10-CM | POA: Diagnosis not present

## 2022-09-21 DIAGNOSIS — R278 Other lack of coordination: Secondary | ICD-10-CM | POA: Diagnosis not present

## 2022-09-21 DIAGNOSIS — M6389 Disorders of muscle in diseases classified elsewhere, multiple sites: Secondary | ICD-10-CM | POA: Diagnosis not present

## 2022-09-22 DIAGNOSIS — M6389 Disorders of muscle in diseases classified elsewhere, multiple sites: Secondary | ICD-10-CM | POA: Diagnosis not present

## 2022-09-22 DIAGNOSIS — I6601 Occlusion and stenosis of right middle cerebral artery: Secondary | ICD-10-CM | POA: Diagnosis not present

## 2022-09-22 DIAGNOSIS — M62562 Muscle wasting and atrophy, not elsewhere classified, left lower leg: Secondary | ICD-10-CM | POA: Diagnosis not present

## 2022-09-22 DIAGNOSIS — I69352 Hemiplegia and hemiparesis following cerebral infarction affecting left dominant side: Secondary | ICD-10-CM | POA: Diagnosis not present

## 2022-09-22 DIAGNOSIS — R2689 Other abnormalities of gait and mobility: Secondary | ICD-10-CM | POA: Diagnosis not present

## 2022-09-22 DIAGNOSIS — R1312 Dysphagia, oropharyngeal phase: Secondary | ICD-10-CM | POA: Diagnosis not present

## 2022-09-22 DIAGNOSIS — R278 Other lack of coordination: Secondary | ICD-10-CM | POA: Diagnosis not present

## 2022-09-25 DIAGNOSIS — M62562 Muscle wasting and atrophy, not elsewhere classified, left lower leg: Secondary | ICD-10-CM | POA: Diagnosis not present

## 2022-09-25 DIAGNOSIS — I69352 Hemiplegia and hemiparesis following cerebral infarction affecting left dominant side: Secondary | ICD-10-CM | POA: Diagnosis not present

## 2022-09-25 DIAGNOSIS — M6389 Disorders of muscle in diseases classified elsewhere, multiple sites: Secondary | ICD-10-CM | POA: Diagnosis not present

## 2022-09-25 DIAGNOSIS — I6601 Occlusion and stenosis of right middle cerebral artery: Secondary | ICD-10-CM | POA: Diagnosis not present

## 2022-09-25 DIAGNOSIS — R278 Other lack of coordination: Secondary | ICD-10-CM | POA: Diagnosis not present

## 2022-09-25 DIAGNOSIS — R2689 Other abnormalities of gait and mobility: Secondary | ICD-10-CM | POA: Diagnosis not present

## 2022-09-25 DIAGNOSIS — R1312 Dysphagia, oropharyngeal phase: Secondary | ICD-10-CM | POA: Diagnosis not present

## 2022-09-26 DIAGNOSIS — M6389 Disorders of muscle in diseases classified elsewhere, multiple sites: Secondary | ICD-10-CM | POA: Diagnosis not present

## 2022-09-26 DIAGNOSIS — R2689 Other abnormalities of gait and mobility: Secondary | ICD-10-CM | POA: Diagnosis not present

## 2022-09-26 DIAGNOSIS — I6601 Occlusion and stenosis of right middle cerebral artery: Secondary | ICD-10-CM | POA: Diagnosis not present

## 2022-09-26 DIAGNOSIS — M62562 Muscle wasting and atrophy, not elsewhere classified, left lower leg: Secondary | ICD-10-CM | POA: Diagnosis not present

## 2022-09-26 DIAGNOSIS — R278 Other lack of coordination: Secondary | ICD-10-CM | POA: Diagnosis not present

## 2022-09-26 DIAGNOSIS — I69352 Hemiplegia and hemiparesis following cerebral infarction affecting left dominant side: Secondary | ICD-10-CM | POA: Diagnosis not present

## 2022-09-27 ENCOUNTER — Ambulatory Visit: Payer: Medicare Other | Admitting: Cardiovascular Disease

## 2022-09-27 DIAGNOSIS — M6389 Disorders of muscle in diseases classified elsewhere, multiple sites: Secondary | ICD-10-CM | POA: Diagnosis not present

## 2022-09-27 DIAGNOSIS — R278 Other lack of coordination: Secondary | ICD-10-CM | POA: Diagnosis not present

## 2022-09-27 DIAGNOSIS — I69352 Hemiplegia and hemiparesis following cerebral infarction affecting left dominant side: Secondary | ICD-10-CM | POA: Diagnosis not present

## 2022-09-27 DIAGNOSIS — M62562 Muscle wasting and atrophy, not elsewhere classified, left lower leg: Secondary | ICD-10-CM | POA: Diagnosis not present

## 2022-09-27 DIAGNOSIS — R2689 Other abnormalities of gait and mobility: Secondary | ICD-10-CM | POA: Diagnosis not present

## 2022-09-27 DIAGNOSIS — I6601 Occlusion and stenosis of right middle cerebral artery: Secondary | ICD-10-CM | POA: Diagnosis not present

## 2022-09-27 DIAGNOSIS — R1312 Dysphagia, oropharyngeal phase: Secondary | ICD-10-CM | POA: Diagnosis not present

## 2022-09-28 DIAGNOSIS — I69352 Hemiplegia and hemiparesis following cerebral infarction affecting left dominant side: Secondary | ICD-10-CM | POA: Diagnosis not present

## 2022-09-28 DIAGNOSIS — R278 Other lack of coordination: Secondary | ICD-10-CM | POA: Diagnosis not present

## 2022-09-28 DIAGNOSIS — M6389 Disorders of muscle in diseases classified elsewhere, multiple sites: Secondary | ICD-10-CM | POA: Diagnosis not present

## 2022-09-28 DIAGNOSIS — R2689 Other abnormalities of gait and mobility: Secondary | ICD-10-CM | POA: Diagnosis not present

## 2022-09-28 DIAGNOSIS — R1312 Dysphagia, oropharyngeal phase: Secondary | ICD-10-CM | POA: Diagnosis not present

## 2022-09-28 DIAGNOSIS — I6601 Occlusion and stenosis of right middle cerebral artery: Secondary | ICD-10-CM | POA: Diagnosis not present

## 2022-09-28 DIAGNOSIS — M62562 Muscle wasting and atrophy, not elsewhere classified, left lower leg: Secondary | ICD-10-CM | POA: Diagnosis not present

## 2022-09-28 LAB — BASIC METABOLIC PANEL
BUN: 19 (ref 4–21)
CO2: 25 — AB (ref 13–22)
Chloride: 106 (ref 99–108)
Creatinine: 1.1 (ref 0.6–1.3)
Glucose: 101
Potassium: 3.9 mEq/L (ref 3.5–5.1)
Sodium: 142 (ref 137–147)

## 2022-09-28 LAB — COMPREHENSIVE METABOLIC PANEL
Calcium: 8.8 (ref 8.7–10.7)
eGFR: 69

## 2022-09-29 DIAGNOSIS — M6389 Disorders of muscle in diseases classified elsewhere, multiple sites: Secondary | ICD-10-CM | POA: Diagnosis not present

## 2022-09-29 DIAGNOSIS — I6601 Occlusion and stenosis of right middle cerebral artery: Secondary | ICD-10-CM | POA: Diagnosis not present

## 2022-09-29 DIAGNOSIS — R278 Other lack of coordination: Secondary | ICD-10-CM | POA: Diagnosis not present

## 2022-09-29 DIAGNOSIS — M62562 Muscle wasting and atrophy, not elsewhere classified, left lower leg: Secondary | ICD-10-CM | POA: Diagnosis not present

## 2022-09-29 DIAGNOSIS — I69352 Hemiplegia and hemiparesis following cerebral infarction affecting left dominant side: Secondary | ICD-10-CM | POA: Diagnosis not present

## 2022-09-29 DIAGNOSIS — R2689 Other abnormalities of gait and mobility: Secondary | ICD-10-CM | POA: Diagnosis not present

## 2022-10-02 DIAGNOSIS — R1312 Dysphagia, oropharyngeal phase: Secondary | ICD-10-CM | POA: Diagnosis not present

## 2022-10-02 DIAGNOSIS — M62562 Muscle wasting and atrophy, not elsewhere classified, left lower leg: Secondary | ICD-10-CM | POA: Diagnosis not present

## 2022-10-02 DIAGNOSIS — R278 Other lack of coordination: Secondary | ICD-10-CM | POA: Diagnosis not present

## 2022-10-02 DIAGNOSIS — I6601 Occlusion and stenosis of right middle cerebral artery: Secondary | ICD-10-CM | POA: Diagnosis not present

## 2022-10-02 DIAGNOSIS — I69352 Hemiplegia and hemiparesis following cerebral infarction affecting left dominant side: Secondary | ICD-10-CM | POA: Diagnosis not present

## 2022-10-02 DIAGNOSIS — M6389 Disorders of muscle in diseases classified elsewhere, multiple sites: Secondary | ICD-10-CM | POA: Diagnosis not present

## 2022-10-02 DIAGNOSIS — R2689 Other abnormalities of gait and mobility: Secondary | ICD-10-CM | POA: Diagnosis not present

## 2022-10-03 DIAGNOSIS — I69352 Hemiplegia and hemiparesis following cerebral infarction affecting left dominant side: Secondary | ICD-10-CM | POA: Diagnosis not present

## 2022-10-03 DIAGNOSIS — M62562 Muscle wasting and atrophy, not elsewhere classified, left lower leg: Secondary | ICD-10-CM | POA: Diagnosis not present

## 2022-10-03 DIAGNOSIS — R2689 Other abnormalities of gait and mobility: Secondary | ICD-10-CM | POA: Diagnosis not present

## 2022-10-03 DIAGNOSIS — M6389 Disorders of muscle in diseases classified elsewhere, multiple sites: Secondary | ICD-10-CM | POA: Diagnosis not present

## 2022-10-03 DIAGNOSIS — I6601 Occlusion and stenosis of right middle cerebral artery: Secondary | ICD-10-CM | POA: Diagnosis not present

## 2022-10-03 DIAGNOSIS — R278 Other lack of coordination: Secondary | ICD-10-CM | POA: Diagnosis not present

## 2022-10-04 DIAGNOSIS — I6601 Occlusion and stenosis of right middle cerebral artery: Secondary | ICD-10-CM | POA: Diagnosis not present

## 2022-10-04 DIAGNOSIS — M6389 Disorders of muscle in diseases classified elsewhere, multiple sites: Secondary | ICD-10-CM | POA: Diagnosis not present

## 2022-10-04 DIAGNOSIS — M62562 Muscle wasting and atrophy, not elsewhere classified, left lower leg: Secondary | ICD-10-CM | POA: Diagnosis not present

## 2022-10-04 DIAGNOSIS — R2689 Other abnormalities of gait and mobility: Secondary | ICD-10-CM | POA: Diagnosis not present

## 2022-10-04 DIAGNOSIS — I69352 Hemiplegia and hemiparesis following cerebral infarction affecting left dominant side: Secondary | ICD-10-CM | POA: Diagnosis not present

## 2022-10-04 DIAGNOSIS — R278 Other lack of coordination: Secondary | ICD-10-CM | POA: Diagnosis not present

## 2022-10-05 DIAGNOSIS — I6601 Occlusion and stenosis of right middle cerebral artery: Secondary | ICD-10-CM | POA: Diagnosis not present

## 2022-10-05 DIAGNOSIS — M6389 Disorders of muscle in diseases classified elsewhere, multiple sites: Secondary | ICD-10-CM | POA: Diagnosis not present

## 2022-10-05 DIAGNOSIS — I69352 Hemiplegia and hemiparesis following cerebral infarction affecting left dominant side: Secondary | ICD-10-CM | POA: Diagnosis not present

## 2022-10-05 DIAGNOSIS — M62562 Muscle wasting and atrophy, not elsewhere classified, left lower leg: Secondary | ICD-10-CM | POA: Diagnosis not present

## 2022-10-05 DIAGNOSIS — R2689 Other abnormalities of gait and mobility: Secondary | ICD-10-CM | POA: Diagnosis not present

## 2022-10-05 DIAGNOSIS — R278 Other lack of coordination: Secondary | ICD-10-CM | POA: Diagnosis not present

## 2022-10-06 DIAGNOSIS — I69352 Hemiplegia and hemiparesis following cerebral infarction affecting left dominant side: Secondary | ICD-10-CM | POA: Diagnosis not present

## 2022-10-06 DIAGNOSIS — M62562 Muscle wasting and atrophy, not elsewhere classified, left lower leg: Secondary | ICD-10-CM | POA: Diagnosis not present

## 2022-10-06 DIAGNOSIS — M6389 Disorders of muscle in diseases classified elsewhere, multiple sites: Secondary | ICD-10-CM | POA: Diagnosis not present

## 2022-10-06 DIAGNOSIS — R2689 Other abnormalities of gait and mobility: Secondary | ICD-10-CM | POA: Diagnosis not present

## 2022-10-06 DIAGNOSIS — I6601 Occlusion and stenosis of right middle cerebral artery: Secondary | ICD-10-CM | POA: Diagnosis not present

## 2022-10-06 DIAGNOSIS — R278 Other lack of coordination: Secondary | ICD-10-CM | POA: Diagnosis not present

## 2022-10-09 DIAGNOSIS — R2689 Other abnormalities of gait and mobility: Secondary | ICD-10-CM | POA: Diagnosis not present

## 2022-10-09 DIAGNOSIS — M6389 Disorders of muscle in diseases classified elsewhere, multiple sites: Secondary | ICD-10-CM | POA: Diagnosis not present

## 2022-10-09 DIAGNOSIS — R278 Other lack of coordination: Secondary | ICD-10-CM | POA: Diagnosis not present

## 2022-10-09 DIAGNOSIS — I6601 Occlusion and stenosis of right middle cerebral artery: Secondary | ICD-10-CM | POA: Diagnosis not present

## 2022-10-09 DIAGNOSIS — I69352 Hemiplegia and hemiparesis following cerebral infarction affecting left dominant side: Secondary | ICD-10-CM | POA: Diagnosis not present

## 2022-10-09 DIAGNOSIS — M62562 Muscle wasting and atrophy, not elsewhere classified, left lower leg: Secondary | ICD-10-CM | POA: Diagnosis not present

## 2022-10-10 DIAGNOSIS — I69352 Hemiplegia and hemiparesis following cerebral infarction affecting left dominant side: Secondary | ICD-10-CM | POA: Diagnosis not present

## 2022-10-10 DIAGNOSIS — I6601 Occlusion and stenosis of right middle cerebral artery: Secondary | ICD-10-CM | POA: Diagnosis not present

## 2022-10-10 DIAGNOSIS — R278 Other lack of coordination: Secondary | ICD-10-CM | POA: Diagnosis not present

## 2022-10-10 DIAGNOSIS — M62562 Muscle wasting and atrophy, not elsewhere classified, left lower leg: Secondary | ICD-10-CM | POA: Diagnosis not present

## 2022-10-10 DIAGNOSIS — R2689 Other abnormalities of gait and mobility: Secondary | ICD-10-CM | POA: Diagnosis not present

## 2022-10-10 DIAGNOSIS — M6389 Disorders of muscle in diseases classified elsewhere, multiple sites: Secondary | ICD-10-CM | POA: Diagnosis not present

## 2022-10-11 DIAGNOSIS — R278 Other lack of coordination: Secondary | ICD-10-CM | POA: Diagnosis not present

## 2022-10-11 DIAGNOSIS — M62562 Muscle wasting and atrophy, not elsewhere classified, left lower leg: Secondary | ICD-10-CM | POA: Diagnosis not present

## 2022-10-11 DIAGNOSIS — M6389 Disorders of muscle in diseases classified elsewhere, multiple sites: Secondary | ICD-10-CM | POA: Diagnosis not present

## 2022-10-11 DIAGNOSIS — I6601 Occlusion and stenosis of right middle cerebral artery: Secondary | ICD-10-CM | POA: Diagnosis not present

## 2022-10-11 DIAGNOSIS — I69352 Hemiplegia and hemiparesis following cerebral infarction affecting left dominant side: Secondary | ICD-10-CM | POA: Diagnosis not present

## 2022-10-11 DIAGNOSIS — R2689 Other abnormalities of gait and mobility: Secondary | ICD-10-CM | POA: Diagnosis not present

## 2022-10-12 ENCOUNTER — Non-Acute Institutional Stay (SKILLED_NURSING_FACILITY): Payer: Self-pay | Admitting: Adult Health

## 2022-10-12 ENCOUNTER — Encounter: Payer: Self-pay | Admitting: Adult Health

## 2022-10-12 DIAGNOSIS — R278 Other lack of coordination: Secondary | ICD-10-CM | POA: Diagnosis not present

## 2022-10-12 DIAGNOSIS — I10 Essential (primary) hypertension: Secondary | ICD-10-CM

## 2022-10-12 DIAGNOSIS — M12811 Other specific arthropathies, not elsewhere classified, right shoulder: Secondary | ICD-10-CM | POA: Diagnosis not present

## 2022-10-12 DIAGNOSIS — I63511 Cerebral infarction due to unspecified occlusion or stenosis of right middle cerebral artery: Secondary | ICD-10-CM | POA: Diagnosis not present

## 2022-10-12 DIAGNOSIS — I480 Paroxysmal atrial fibrillation: Secondary | ICD-10-CM | POA: Diagnosis not present

## 2022-10-12 DIAGNOSIS — I6601 Occlusion and stenosis of right middle cerebral artery: Secondary | ICD-10-CM | POA: Diagnosis not present

## 2022-10-12 DIAGNOSIS — R2689 Other abnormalities of gait and mobility: Secondary | ICD-10-CM | POA: Diagnosis not present

## 2022-10-12 DIAGNOSIS — F5101 Primary insomnia: Secondary | ICD-10-CM | POA: Diagnosis not present

## 2022-10-12 DIAGNOSIS — I69352 Hemiplegia and hemiparesis following cerebral infarction affecting left dominant side: Secondary | ICD-10-CM | POA: Diagnosis not present

## 2022-10-12 DIAGNOSIS — I69354 Hemiplegia and hemiparesis following cerebral infarction affecting left non-dominant side: Secondary | ICD-10-CM

## 2022-10-12 DIAGNOSIS — M62562 Muscle wasting and atrophy, not elsewhere classified, left lower leg: Secondary | ICD-10-CM | POA: Diagnosis not present

## 2022-10-12 DIAGNOSIS — E782 Mixed hyperlipidemia: Secondary | ICD-10-CM

## 2022-10-12 DIAGNOSIS — M6389 Disorders of muscle in diseases classified elsewhere, multiple sites: Secondary | ICD-10-CM | POA: Diagnosis not present

## 2022-10-12 NOTE — Progress Notes (Signed)
Location:  [facility] Room Number: 151A Place of Service:  SNF [phone]) Provider:  Fletcher Anon, NP  PCP: Irven Coe, MD  Patient Care Team: Irven Coe, MD as PCP - General (Family Medicine) Hillis Range, MD (Inactive) as PCP - Electrophysiology (Cardiology) Mealor, Roberts Gaudy, MD as PCP - Cardiology (Cardiology)  Extended Emergency Contact Information Primary Emergency Contact: Strobel,Wendy Mobile Phone: [phone] Relation: Daughter Secondary Emergency Contact: Reiger,[name] Address: [address]          [place] [address] [place] Mobile Phone: [phone] Relation: Spouse  Code Status:  Full Code Goals of care: Advanced Directive information    10/12/2022   12:55 PM  Advanced Directives  Does Patient Have a Medical Advance Directive? No  Would patient like information on creating a medical advance directive? No - Patient declined     Chief Complaint  Patient presents with   Medical Management of Chronic Issues    Medical Management of Chronic Issues.     HPI:  Pt is a 89 y.o. male seen today for medical management of chronic diseases.   He currently resides in skilled rehab receiving therapy due to a a right middle cerebral artery CVA in Feb of 2024 which led to left sided weakness. Also PMH HLD, HTN, depression.   Seen on 7/1 for a fall and vivid dream. Remeron dose reduce. NO further issues. He has some feeling of depression related to his illness and his wife's dementia but he is feeling lexapro and remeron are helping. His plan is to  move to the [facility] [place]. His goal would be to return to live independently but he still requires assistance with transfers and is not ambulatory.   No issues with swallowing or vision issues after his CVA.   Right shoulder: rotator cuff pathology. Saw ortho no further orders. Denies pain. Reduced ROM has led to low progress as well in therapy.   Past Medical  History:  Diagnosis Date   High cholesterol    05/2013; no defecits   Hyperlipemia    Left arm weakness    Left hand weakness    Personal history of colonic polyp - adenoma  10/23/2013   Stroke (HCC) 05/2013   MRI on 07/02/13 = Multifocal acute & subacute infarction involving right frontal MCA/ACA & left parietal MCA/PCA watershed areas   Past Surgical History:  Procedure Laterality Date   IR CT HEAD LTD  05/10/2022   IR PERCUTANEOUS ART THROMBECTOMY/INFUSION INTRACRANIAL INC DIAG ANGIO  05/10/2022   LOOP RECORDER IMPLANT N/A 10/31/2013   Procedure: LOOP RECORDER IMPLANT;  Surgeon: Gardiner Rhyme, MD;  Location: [facility] CATH LAB;  Service: Cardiovascular;  Laterality: N/A;   RADIOLOGY WITH ANESTHESIA N/A 05/10/2022   Procedure: IR WITH ANESTHESIA;  Surgeon: Julieanne Cotton, MD;  Location: [facility] OR;  Service: Radiology;  Laterality: N/A;   SHOULDER SURGERY Bilateral 2003, 1995    No Known Allergies  Outpatient Encounter Medications as of 10/12/2022  Medication Sig   acetaminophen (TYLENOL) 325 MG tablet Take 2 tablets (650 mg total) by mouth every 4 (four) hours as needed for mild pain (or temp > 37.5 C (99.5 F)).   amLODipine (NORVASC) 10 MG tablet Take 1 tablet (10 mg total) by mouth daily.   apixaban (ELIQUIS) 5 MG TABS tablet Take 1 tablet (5 mg total) by mouth 2 (two) times daily.   atorvastatin (LIPITOR) 20 MG tablet Take 1 tablet (20 mg total) by mouth daily.  docusate sodium (COLACE) 100 MG capsule Take 1 capsule (100 mg total) by mouth daily.   escitalopram (LEXAPRO) 5 MG tablet Take 10 mg by mouth daily.   magnesium hydroxide (MILK OF MAGNESIA) 400 MG/5ML suspension Take 5 mLs by mouth daily as needed for mild constipation.   mirtazapine (REMERON SOL-TAB) 15 MG disintegrating tablet Take 1 tablet (15 mg total) by mouth at bedtime.   traZODone (DESYREL) 50 MG tablet Take 50 mg by mouth at bedtime.   [DISCONTINUED] melatonin 3 MG TABS tablet Take 3 mg by mouth at bedtime.    [DISCONTINUED] simvastatin (ZOCOR) 40 MG tablet Take 40 mg by mouth at bedtime.   No facility-administered encounter medications on file as of 10/12/2022.    Review of Systems  Constitutional:  Positive for activity change. Negative for appetite change, chills, diaphoresis, fatigue, fever and unexpected weight change.  Respiratory:  Negative for cough, shortness of breath, wheezing and stridor.   Cardiovascular:  Negative for chest pain, palpitations and leg swelling.  Gastrointestinal:  Negative for abdominal distention, abdominal pain, constipation and diarrhea.  Genitourinary:  Negative for difficulty urinating and dysuria.  Musculoskeletal:  Positive for gait problem. Negative for arthralgias, back pain, joint swelling and myalgias.  Neurological:  Positive for weakness. Negative for dizziness, seizures, syncope, facial asymmetry, speech difficulty and headaches.  Hematological:  Negative for adenopathy. Does not bruise/bleed easily.  Psychiatric/Behavioral:  Positive for dysphoric mood. Negative for agitation, behavioral problems and confusion.     Immunization History  Administered Date(s) Administered   PFIZER(Purple Top)SARS-COV-2 Vaccination 05/02/2019, 05/26/2019   Pneumococcal Polysaccharide-23 07/06/2013   Pertinent  Health Maintenance Due  Topic Date Due   INFLUENZA VACCINE  10/19/2022      01/18/2015    8:32 AM 07/19/2015    8:07 AM 07/11/2022    2:20 PM  Fall Risk  Falls in the past year? No No 1  Was there an injury with Fall?   0  Fall Risk Category Calculator   1   Functional Status Survey:    Vitals:   10/12/22 1249  BP: 121/67  Pulse: 88  Resp: 16  Temp: 99 F (37.2 C)  SpO2: 92%  Weight: 205 lb 9.6 oz (93.3 kg)  Height: 6' (1.829 m)   Body mass index is 27.88 kg/m. Physical Exam Vitals and nursing note reviewed.  Constitutional:      General: He is not in acute distress.    Appearance: He is not diaphoretic.  HENT:     Head: Normocephalic and  atraumatic.  Neck:     Thyroid: No thyromegaly.     Vascular: No JVD.     Trachea: No tracheal deviation.  Cardiovascular:     Rate and Rhythm: Normal rate and regular rhythm.     Heart sounds: No murmur heard. Pulmonary:     Effort: Pulmonary effort is normal. No respiratory distress.     Breath sounds: Normal breath sounds. No wheezing.  Abdominal:     General: Bowel sounds are normal. There is no distension.     Palpations: Abdomen is soft.     Tenderness: There is no abdominal tenderness.  Musculoskeletal:     Comments: Right shoulder with reduced ROM. NO tenderness on palpation  Lymphadenopathy:     Cervical: No cervical adenopathy.  Skin:    General: Skin is warm and dry.  Neurological:     Mental Status: He is alert and oriented to person, place, and time.     Comments: Left  sided weakness.      Labs reviewed: Recent Labs    05/11/22 1246 05/12/22 0631 05/18/22 0454 05/19/22 0848 05/30/22 0606 09/01/22 0000 09/28/22 0000  NA 135   < > 137 134* 139 141 142  K 3.9   < > 3.8 3.7 3.8 3.8 3.9  CL 103   < > 104 99 104 106 106  CO2 27   < > 22 25 25 22  25*  GLUCOSE 123*   < > 127* 182* 93  --   --   BUN 19   < > 18 23 22 20 19   CREATININE 1.13   < > 0.98 1.14 1.26* 1.2 1.1  CALCIUM 8.5*   < > 9.1 9.2 8.9 8.7 8.8  PHOS 2.4*  --   --   --   --   --   --    < > = values in this interval not displayed.   Recent Labs    05/10/22 0005 05/11/22 1246 05/19/22 0848 09/01/22 0000  AST 27  --  33 21  ALT 20  --  48* 23  ALKPHOS 93  --  99 128*  BILITOT 0.4  --  1.0  --   PROT 7.0  --  6.8  --   ALBUMIN 4.1 3.5 3.6 3.6   Recent Labs    05/10/22 0545 05/11/22 1246 05/18/22 0454 05/19/22 0848 05/30/22 0606 09/01/22 0000  WBC 11.6*   < > 8.1 8.1 7.5 6.0  NEUTROABS 9.2*  --   --  5.8 4.7  --   HGB 16.0   < > 15.7 16.3 15.2 14.0  HCT 46.7   < > 44.0 46.0 43.0 41  MCV 97.3   < > 94.4 96.2 97.1  --   PLT 164   < > 198 210 149* 150   < > = values in this  interval not displayed.   Lab Results  Component Value Date   TSH 2.23 09/01/2022   Lab Results  Component Value Date   HGBA1C 5.8 (H) 05/10/2022   Lab Results  Component Value Date   CHOL 134 05/10/2022   HDL 40 (L) 05/10/2022   LDLCALC 56 05/10/2022   TRIG 190 (H) 05/10/2022   CHOLHDL 3.4 05/10/2022    Significant Diagnostic Results in last 30 days:  No results found.  Assessment/Plan  1. Right middle cerebral artery stroke [facility]) Feb of 2024 Led to #2 Followed by neurology   2. Hemiparesis of left nondominant side as late effect of cerebral infarction (HCC) Continues to work with therapy at [facility], beginning to plateau  3. Paroxysmal atrial fibrillation (HCC) Continue Eliquis for CVA risk reduction   4. Essential hypertension Controlled  Continue Norvasc   5. Primary insomnia Continue Remeron for mood and sleep   6. Mixed hyperlipidemia Continue Lipitor   7. Rotator cuff arthropathy of right shoulder Continue to work with therapy  No other intervention per ortho  8. Depression Improved, continue remeron and lexapro   Family/ staff Communication: resident and nurse, wife at bedside  Labs/tests ordered:  NA

## 2022-10-13 DIAGNOSIS — M6389 Disorders of muscle in diseases classified elsewhere, multiple sites: Secondary | ICD-10-CM | POA: Diagnosis not present

## 2022-10-13 DIAGNOSIS — R278 Other lack of coordination: Secondary | ICD-10-CM | POA: Diagnosis not present

## 2022-10-13 DIAGNOSIS — I6601 Occlusion and stenosis of right middle cerebral artery: Secondary | ICD-10-CM | POA: Diagnosis not present

## 2022-10-14 DIAGNOSIS — I6601 Occlusion and stenosis of right middle cerebral artery: Secondary | ICD-10-CM | POA: Diagnosis not present

## 2022-10-14 DIAGNOSIS — R2689 Other abnormalities of gait and mobility: Secondary | ICD-10-CM | POA: Diagnosis not present

## 2022-10-14 DIAGNOSIS — M62562 Muscle wasting and atrophy, not elsewhere classified, left lower leg: Secondary | ICD-10-CM | POA: Diagnosis not present

## 2022-10-14 DIAGNOSIS — R278 Other lack of coordination: Secondary | ICD-10-CM | POA: Diagnosis not present

## 2022-10-14 DIAGNOSIS — I69352 Hemiplegia and hemiparesis following cerebral infarction affecting left dominant side: Secondary | ICD-10-CM | POA: Diagnosis not present

## 2022-10-16 DIAGNOSIS — M62562 Muscle wasting and atrophy, not elsewhere classified, left lower leg: Secondary | ICD-10-CM | POA: Diagnosis not present

## 2022-10-16 DIAGNOSIS — R278 Other lack of coordination: Secondary | ICD-10-CM | POA: Diagnosis not present

## 2022-10-16 DIAGNOSIS — I69352 Hemiplegia and hemiparesis following cerebral infarction affecting left dominant side: Secondary | ICD-10-CM | POA: Diagnosis not present

## 2022-10-16 DIAGNOSIS — R2689 Other abnormalities of gait and mobility: Secondary | ICD-10-CM | POA: Diagnosis not present

## 2022-10-16 DIAGNOSIS — I6601 Occlusion and stenosis of right middle cerebral artery: Secondary | ICD-10-CM | POA: Diagnosis not present

## 2022-10-16 DIAGNOSIS — M6389 Disorders of muscle in diseases classified elsewhere, multiple sites: Secondary | ICD-10-CM | POA: Diagnosis not present

## 2022-10-17 DIAGNOSIS — R278 Other lack of coordination: Secondary | ICD-10-CM | POA: Diagnosis not present

## 2022-10-17 DIAGNOSIS — R2689 Other abnormalities of gait and mobility: Secondary | ICD-10-CM | POA: Diagnosis not present

## 2022-10-17 DIAGNOSIS — M6389 Disorders of muscle in diseases classified elsewhere, multiple sites: Secondary | ICD-10-CM | POA: Diagnosis not present

## 2022-10-17 DIAGNOSIS — M62562 Muscle wasting and atrophy, not elsewhere classified, left lower leg: Secondary | ICD-10-CM | POA: Diagnosis not present

## 2022-10-17 DIAGNOSIS — I69352 Hemiplegia and hemiparesis following cerebral infarction affecting left dominant side: Secondary | ICD-10-CM | POA: Diagnosis not present

## 2022-10-17 DIAGNOSIS — I6601 Occlusion and stenosis of right middle cerebral artery: Secondary | ICD-10-CM | POA: Diagnosis not present

## 2022-10-18 DIAGNOSIS — R2689 Other abnormalities of gait and mobility: Secondary | ICD-10-CM | POA: Diagnosis not present

## 2022-10-18 DIAGNOSIS — M6389 Disorders of muscle in diseases classified elsewhere, multiple sites: Secondary | ICD-10-CM | POA: Diagnosis not present

## 2022-10-18 DIAGNOSIS — M62562 Muscle wasting and atrophy, not elsewhere classified, left lower leg: Secondary | ICD-10-CM | POA: Diagnosis not present

## 2022-10-18 DIAGNOSIS — I69352 Hemiplegia and hemiparesis following cerebral infarction affecting left dominant side: Secondary | ICD-10-CM | POA: Diagnosis not present

## 2022-10-18 DIAGNOSIS — I6601 Occlusion and stenosis of right middle cerebral artery: Secondary | ICD-10-CM | POA: Diagnosis not present

## 2022-10-18 DIAGNOSIS — R278 Other lack of coordination: Secondary | ICD-10-CM | POA: Diagnosis not present

## 2022-10-19 DIAGNOSIS — I69352 Hemiplegia and hemiparesis following cerebral infarction affecting left dominant side: Secondary | ICD-10-CM | POA: Diagnosis not present

## 2022-10-19 DIAGNOSIS — I6601 Occlusion and stenosis of right middle cerebral artery: Secondary | ICD-10-CM | POA: Diagnosis not present

## 2022-10-19 DIAGNOSIS — M62562 Muscle wasting and atrophy, not elsewhere classified, left lower leg: Secondary | ICD-10-CM | POA: Diagnosis not present

## 2022-10-19 DIAGNOSIS — R278 Other lack of coordination: Secondary | ICD-10-CM | POA: Diagnosis not present

## 2022-10-19 DIAGNOSIS — M6389 Disorders of muscle in diseases classified elsewhere, multiple sites: Secondary | ICD-10-CM | POA: Diagnosis not present

## 2022-10-19 DIAGNOSIS — R2689 Other abnormalities of gait and mobility: Secondary | ICD-10-CM | POA: Diagnosis not present

## 2022-10-20 DIAGNOSIS — M62562 Muscle wasting and atrophy, not elsewhere classified, left lower leg: Secondary | ICD-10-CM | POA: Diagnosis not present

## 2022-10-20 DIAGNOSIS — I69352 Hemiplegia and hemiparesis following cerebral infarction affecting left dominant side: Secondary | ICD-10-CM | POA: Diagnosis not present

## 2022-10-20 DIAGNOSIS — R278 Other lack of coordination: Secondary | ICD-10-CM | POA: Diagnosis not present

## 2022-10-20 DIAGNOSIS — R2689 Other abnormalities of gait and mobility: Secondary | ICD-10-CM | POA: Diagnosis not present

## 2022-10-20 DIAGNOSIS — M6389 Disorders of muscle in diseases classified elsewhere, multiple sites: Secondary | ICD-10-CM | POA: Diagnosis not present

## 2022-10-20 DIAGNOSIS — I6601 Occlusion and stenosis of right middle cerebral artery: Secondary | ICD-10-CM | POA: Diagnosis not present

## 2022-10-22 DIAGNOSIS — I6601 Occlusion and stenosis of right middle cerebral artery: Secondary | ICD-10-CM | POA: Diagnosis not present

## 2022-10-22 DIAGNOSIS — R278 Other lack of coordination: Secondary | ICD-10-CM | POA: Diagnosis not present

## 2022-10-22 DIAGNOSIS — M62562 Muscle wasting and atrophy, not elsewhere classified, left lower leg: Secondary | ICD-10-CM | POA: Diagnosis not present

## 2022-10-22 DIAGNOSIS — I69352 Hemiplegia and hemiparesis following cerebral infarction affecting left dominant side: Secondary | ICD-10-CM | POA: Diagnosis not present

## 2022-10-22 DIAGNOSIS — R2689 Other abnormalities of gait and mobility: Secondary | ICD-10-CM | POA: Diagnosis not present

## 2022-10-23 DIAGNOSIS — I6601 Occlusion and stenosis of right middle cerebral artery: Secondary | ICD-10-CM | POA: Diagnosis not present

## 2022-10-23 DIAGNOSIS — R278 Other lack of coordination: Secondary | ICD-10-CM | POA: Diagnosis not present

## 2022-10-23 DIAGNOSIS — M6389 Disorders of muscle in diseases classified elsewhere, multiple sites: Secondary | ICD-10-CM | POA: Diagnosis not present

## 2022-10-24 ENCOUNTER — Ambulatory Visit: Payer: Medicare Other | Attending: Cardiovascular Disease | Admitting: Cardiovascular Disease

## 2022-10-24 ENCOUNTER — Encounter: Payer: Self-pay | Admitting: Cardiovascular Disease

## 2022-10-24 VITALS — BP 126/64 | HR 75 | Ht 72.0 in | Wt 207.8 lb

## 2022-10-24 DIAGNOSIS — R278 Other lack of coordination: Secondary | ICD-10-CM | POA: Diagnosis not present

## 2022-10-24 DIAGNOSIS — R2689 Other abnormalities of gait and mobility: Secondary | ICD-10-CM | POA: Diagnosis not present

## 2022-10-24 DIAGNOSIS — M62562 Muscle wasting and atrophy, not elsewhere classified, left lower leg: Secondary | ICD-10-CM | POA: Diagnosis not present

## 2022-10-24 DIAGNOSIS — I480 Paroxysmal atrial fibrillation: Secondary | ICD-10-CM

## 2022-10-24 DIAGNOSIS — I69352 Hemiplegia and hemiparesis following cerebral infarction affecting left dominant side: Secondary | ICD-10-CM | POA: Diagnosis not present

## 2022-10-24 DIAGNOSIS — M6389 Disorders of muscle in diseases classified elsewhere, multiple sites: Secondary | ICD-10-CM | POA: Diagnosis not present

## 2022-10-24 DIAGNOSIS — I6601 Occlusion and stenosis of right middle cerebral artery: Secondary | ICD-10-CM | POA: Diagnosis not present

## 2022-10-24 NOTE — Patient Instructions (Signed)
Medication Instructions:  Your physician recommends that you continue on your current medications as directed. Please refer to the Current Medication list given to you today. *If you need a refill on your cardiac medications before your next appointment, please call your pharmacy*   Follow-Up: At [facility], you and your health needs are our priority.  As part of our continuing mission to provide you with exceptional heart care, we have created designated Provider Care Teams.  These Care Teams include your primary Cardiologist (physician) and Advanced Practice Providers (APPs -  Physician Assistants and Nurse Practitioners) who all work together to provide you with the care you need, when you need it.  We recommend signing up for the patient portal called "MyChart".  Sign up information is provided on this After Visit Summary.  MyChart is used to connect with patients for Virtual Visits (Telemedicine).  Patients are able to view lab/test results, encounter notes, upcoming appointments, etc.  Non-urgent messages can be sent to your provider as well.   To learn more about what you can do with MyChart, go to [email or web address].    Your next appointment:   1 year(s)  Provider:   Augustus Mealor, MD  

## 2022-10-24 NOTE — Progress Notes (Signed)
Electrophysiology Office Note:    Date:  10/24/2022   ID:  Edward Maynard, DOB [birth year], MRN [id number]  PCP:  Irven Coe, MD   [facility] Providers Cardiologist:  Maurice Small, MD Electrophysiologist:  Hillis Range, MD (Inactive)     Referring MD: Blair Heys, MD   Chief complaint: follow-up  History of Present Illness:    Edward Maynard is a 89 y.o. male with a hx of PAF, PVCs and NSVT here for EP follow-up.  He is seen as an expedited visit today due to a recent fall after playing golf.  He reports that he has been sedentary for the prior 3 days while watching the [employer].  He played golf on a day when the temperature was in the 70s.  He does note that he was sweating somewhat, but he did not feel very well, so he played only 9 holes.  He felt dehydrated since he had not been consuming much water around that time.  He drove home, and then most immediately after getting out of his car, he felt woozy and then woke up on the ground.  A neighbor witnessed his fall and reported that he got up very soon after he fell.  He did not injure himself, did not hit his head.  Since his last visit, he was admitted for stroke in Feb 2024 despite being on xarelto. He was switched to Eliquis.   He has not had any palpitations, chest pain, shortness of breath.  He does note fatigue, and tasks require little more exertion than they used to.   EKGs/Labs/Other Studies Reviewed:     ZioPatch 14 day 12/2021 Patient had a min HR of 53 bpm, max HR of 231 bpm, and avg HR of 74 bpm. Predominant underlying rhythm was Sinus Rhythm. First Degree AV Block was present. 3 Ventricular Tachycardia runs occurred, the run with the fastest interval lasting 4 beats with a  max rate of 231 bpm, the longest lasting 11 beats with an avg rate of 170 bpm. 138 Supraventricular Tachycardia runs occurred, the run with the fastest interval lasting 32.4 secs with a max rate of 194 bpm, the longest lasting 4  mins 37 secs with an avg  rate of 165 bpm. Some episodes of Supraventricular Tachycardia may be possible Atrial Tachycardia with variable block. Isolated SVEs were occasional (2.6%, [id number]), SVE Couplets were rare (<1.0%, 1464), and SVE Triplets were rare (<1.0%, 394). Isolated  VEs were occasional (1.9%, [id number]), VE Couplets were rare (<1.0%, 672), and VE Triplets were rare (<1.0%, 17). Ventricular Bigeminy and Trigeminy were present.  EKG:  Last EKG results: today - Sinus rhythm   Recent Labs: 09/01/2022: ALT 23; Hemoglobin 14.0; Platelets 150; TSH 2.23 09/28/2022: BUN 19; Creatinine 1.1; Potassium 3.9; Sodium 142    Physical Exam:    VS:  BP 126/64   Pulse 75   Ht 6' (1.829 m)   Wt 207 lb 12.8 oz (94.3 kg)   SpO2 96%   BMI 28.18 kg/m     Wt Readings from Last 3 Encounters:  10/24/22 207 lb 12.8 oz (94.3 kg)  10/12/22 205 lb 9.6 oz (93.3 kg)  09/18/22 203 lb (92.1 kg)     GEN: Well nourished, well developed in no acute distress CARDIAC: RRR, no murmurs, rubs, gallops RESPIRATORY:  Normal work of breathing MUSCULOSKELETAL: no edema    ASSESSMENT & PLAN:    Syncope:   highly suspicious for orthostatic etiology.  doing much better  since increasing water intake.   I cannot exclude arrhythmia as a contributing factor and suggested placement of another loop monitor for reassurance, but he is not very keen on this. I am not going to make any changes today.  Atrial fibrillation:  no AF events on last monitor.  Continue apixaban for secondary hypercoagulable state.  Stroke: Embolic right MCA infarct 04/2022 on xarelto Continue eliquis  SVT: likely AT; episodes are brief. Appears asymptomatic  PVCs: burden < 2% on monitor         Medication Adjustments/Labs and Tests Ordered: Current medicines are reviewed at length with the patient today.  Concerns regarding medicines are outlined above.  Orders Placed This Encounter  Procedures   EKG 12-Lead   No orders of the  defined types were placed in this encounter.    Signed, Maurice Small, MD  10/24/2022 4:37 PM    [facility]

## 2022-10-25 DIAGNOSIS — M6389 Disorders of muscle in diseases classified elsewhere, multiple sites: Secondary | ICD-10-CM | POA: Diagnosis not present

## 2022-10-25 DIAGNOSIS — I6601 Occlusion and stenosis of right middle cerebral artery: Secondary | ICD-10-CM | POA: Diagnosis not present

## 2022-10-25 DIAGNOSIS — M62562 Muscle wasting and atrophy, not elsewhere classified, left lower leg: Secondary | ICD-10-CM | POA: Diagnosis not present

## 2022-10-25 DIAGNOSIS — I69352 Hemiplegia and hemiparesis following cerebral infarction affecting left dominant side: Secondary | ICD-10-CM | POA: Diagnosis not present

## 2022-10-25 DIAGNOSIS — R2689 Other abnormalities of gait and mobility: Secondary | ICD-10-CM | POA: Diagnosis not present

## 2022-10-25 DIAGNOSIS — R278 Other lack of coordination: Secondary | ICD-10-CM | POA: Diagnosis not present

## 2022-10-26 DIAGNOSIS — R278 Other lack of coordination: Secondary | ICD-10-CM | POA: Diagnosis not present

## 2022-10-26 DIAGNOSIS — I69352 Hemiplegia and hemiparesis following cerebral infarction affecting left dominant side: Secondary | ICD-10-CM | POA: Diagnosis not present

## 2022-10-26 DIAGNOSIS — I6601 Occlusion and stenosis of right middle cerebral artery: Secondary | ICD-10-CM | POA: Diagnosis not present

## 2022-10-26 DIAGNOSIS — R2689 Other abnormalities of gait and mobility: Secondary | ICD-10-CM | POA: Diagnosis not present

## 2022-10-26 DIAGNOSIS — M6389 Disorders of muscle in diseases classified elsewhere, multiple sites: Secondary | ICD-10-CM | POA: Diagnosis not present

## 2022-10-26 DIAGNOSIS — M62562 Muscle wasting and atrophy, not elsewhere classified, left lower leg: Secondary | ICD-10-CM | POA: Diagnosis not present

## 2022-10-27 DIAGNOSIS — M6389 Disorders of muscle in diseases classified elsewhere, multiple sites: Secondary | ICD-10-CM | POA: Diagnosis not present

## 2022-10-27 DIAGNOSIS — R278 Other lack of coordination: Secondary | ICD-10-CM | POA: Diagnosis not present

## 2022-10-27 DIAGNOSIS — I6601 Occlusion and stenosis of right middle cerebral artery: Secondary | ICD-10-CM | POA: Diagnosis not present

## 2022-10-29 DIAGNOSIS — I6601 Occlusion and stenosis of right middle cerebral artery: Secondary | ICD-10-CM | POA: Diagnosis not present

## 2022-10-29 DIAGNOSIS — R278 Other lack of coordination: Secondary | ICD-10-CM | POA: Diagnosis not present

## 2022-10-29 DIAGNOSIS — R2689 Other abnormalities of gait and mobility: Secondary | ICD-10-CM | POA: Diagnosis not present

## 2022-10-29 DIAGNOSIS — I69352 Hemiplegia and hemiparesis following cerebral infarction affecting left dominant side: Secondary | ICD-10-CM | POA: Diagnosis not present

## 2022-10-29 DIAGNOSIS — M62562 Muscle wasting and atrophy, not elsewhere classified, left lower leg: Secondary | ICD-10-CM | POA: Diagnosis not present

## 2022-10-30 DIAGNOSIS — R059 Cough, unspecified: Secondary | ICD-10-CM | POA: Diagnosis not present

## 2022-11-01 DIAGNOSIS — M6389 Disorders of muscle in diseases classified elsewhere, multiple sites: Secondary | ICD-10-CM | POA: Diagnosis not present

## 2022-11-01 DIAGNOSIS — I6601 Occlusion and stenosis of right middle cerebral artery: Secondary | ICD-10-CM | POA: Diagnosis not present

## 2022-11-01 DIAGNOSIS — R278 Other lack of coordination: Secondary | ICD-10-CM | POA: Diagnosis not present

## 2022-11-02 DIAGNOSIS — M62562 Muscle wasting and atrophy, not elsewhere classified, left lower leg: Secondary | ICD-10-CM | POA: Diagnosis not present

## 2022-11-02 DIAGNOSIS — M6389 Disorders of muscle in diseases classified elsewhere, multiple sites: Secondary | ICD-10-CM | POA: Diagnosis not present

## 2022-11-02 DIAGNOSIS — R278 Other lack of coordination: Secondary | ICD-10-CM | POA: Diagnosis not present

## 2022-11-02 DIAGNOSIS — I69352 Hemiplegia and hemiparesis following cerebral infarction affecting left dominant side: Secondary | ICD-10-CM | POA: Diagnosis not present

## 2022-11-02 DIAGNOSIS — R2689 Other abnormalities of gait and mobility: Secondary | ICD-10-CM | POA: Diagnosis not present

## 2022-11-02 DIAGNOSIS — I6601 Occlusion and stenosis of right middle cerebral artery: Secondary | ICD-10-CM | POA: Diagnosis not present

## 2022-11-03 ENCOUNTER — Encounter: Payer: Self-pay | Admitting: Adult Health

## 2022-11-03 ENCOUNTER — Non-Acute Institutional Stay (SKILLED_NURSING_FACILITY): Payer: Medicare Other | Admitting: Adult Health

## 2022-11-03 DIAGNOSIS — I63511 Cerebral infarction due to unspecified occlusion or stenosis of right middle cerebral artery: Secondary | ICD-10-CM | POA: Diagnosis not present

## 2022-11-03 DIAGNOSIS — I480 Paroxysmal atrial fibrillation: Secondary | ICD-10-CM

## 2022-11-03 DIAGNOSIS — R2689 Other abnormalities of gait and mobility: Secondary | ICD-10-CM | POA: Diagnosis not present

## 2022-11-03 DIAGNOSIS — I10 Essential (primary) hypertension: Secondary | ICD-10-CM

## 2022-11-03 DIAGNOSIS — F329 Major depressive disorder, single episode, unspecified: Secondary | ICD-10-CM

## 2022-11-03 DIAGNOSIS — I69352 Hemiplegia and hemiparesis following cerebral infarction affecting left dominant side: Secondary | ICD-10-CM | POA: Diagnosis not present

## 2022-11-03 DIAGNOSIS — I482 Chronic atrial fibrillation, unspecified: Secondary | ICD-10-CM | POA: Diagnosis not present

## 2022-11-03 DIAGNOSIS — F5101 Primary insomnia: Secondary | ICD-10-CM

## 2022-11-03 DIAGNOSIS — I6601 Occlusion and stenosis of right middle cerebral artery: Secondary | ICD-10-CM | POA: Diagnosis not present

## 2022-11-03 DIAGNOSIS — M62562 Muscle wasting and atrophy, not elsewhere classified, left lower leg: Secondary | ICD-10-CM | POA: Diagnosis not present

## 2022-11-03 DIAGNOSIS — R278 Other lack of coordination: Secondary | ICD-10-CM | POA: Diagnosis not present

## 2022-11-03 DIAGNOSIS — M6389 Disorders of muscle in diseases classified elsewhere, multiple sites: Secondary | ICD-10-CM | POA: Diagnosis not present

## 2022-11-03 DIAGNOSIS — E782 Mixed hyperlipidemia: Secondary | ICD-10-CM | POA: Diagnosis not present

## 2022-11-03 MED ORDER — DOCUSATE SODIUM 100 MG PO CAPS: 100.0000 mg | ORAL_CAPSULE | Freq: Every day | ORAL | Status: AC | PRN

## 2022-11-03 NOTE — Progress Notes (Signed)
Location:  Medical illustrator of Service:  SNF (31) Provider:  Fletcher Anon, NP  PCP: Irven Coe, MD  Patient Care Team: Irven Coe, MD as PCP - General (Family Medicine) Hillis Range, MD (Inactive) as PCP - Electrophysiology (Cardiology) Mealor, Roberts Gaudy, MD as PCP - Cardiology (Cardiology)  Extended Emergency Contact Information Primary Emergency Contact: Strobel,Wendy Mobile Phone: [phone] Relation: Daughter Secondary Emergency Contact: Levi,[name] Address: [address]          [place] [address] [place] Mobile Phone: [phone] Relation: Spouse  Code Status:  Full Code Goals of care: Advanced Directive information    10/12/2022   12:55 PM  Advanced Directives  Does Patient Have a Medical Advance Directive? No  Would patient like information on creating a medical advance directive? No - Patient declined     Chief Complaint  Patient presents with   Medical Management of Chronic Issues    HPI:  Pt is a 89 y.o. male seen today for discharge from [facility] rehab to a skilled facility with the [place].   He currently resides in skilled rehab receiving therapy due to a a right middle cerebral artery CVA in Feb of 2024 which led to left sided weakness. Also PMH HLD, HTN, depression.   He is able to stand pivot but not ambulatory Needs help with all ADLs No issues with swallowing  issues after his CVA.   Right shoulder: rotator cuff pathology. Saw ortho no further orders. Denies pain. Reduced ROM has led to low progress as well in therapy.   Depression: he has some feelings of depression related to losing his home and his wife now lives by herself with dementia. He is on lexapro and remeron. He reports he is sleeping well with no further bad dreams. Feels that his depression is not better or worse. Remains situational. Denies lack of appetite, lack of motivation. Sleeps well.   Afib: saw cardiology 10/24/22 no new  orders.   Past Medical History:  Diagnosis Date   High cholesterol    05/2013; no defecits   Hyperlipemia    Left arm weakness    Left hand weakness    Personal history of colonic polyp - adenoma  10/23/2013   Stroke (HCC) 05/2013   MRI on 07/02/13 = Multifocal acute & subacute infarction involving right frontal MCA/ACA & left parietal MCA/PCA watershed areas   Past Surgical History:  Procedure Laterality Date   IR CT HEAD LTD  05/10/2022   IR PERCUTANEOUS ART THROMBECTOMY/INFUSION INTRACRANIAL INC DIAG ANGIO  05/10/2022   LOOP RECORDER IMPLANT N/A 10/31/2013   Procedure: LOOP RECORDER IMPLANT;  Surgeon: Gardiner Rhyme, MD;  Location: [facility] CATH [facility];  Service: Cardiovascular;  Laterality: N/A;   RADIOLOGY WITH ANESTHESIA N/A 05/10/2022   Procedure: IR WITH ANESTHESIA;  Surgeon: Julieanne Cotton, MD;  Location: [facility] OR;  Service: Radiology;  Laterality: N/A;   SHOULDER SURGERY Bilateral 2003, 1995    No Known Allergies  Outpatient Encounter Medications as of 11/03/2022  Medication Sig   acetaminophen (TYLENOL) 325 MG tablet Take 2 tablets (650 mg total) by mouth every 4 (four) hours as needed for mild pain (or temp > 37.5 C (99.5 F)).   amLODipine (NORVASC) 10 MG tablet Take 1 tablet (10 mg total) by mouth daily.   apixaban (ELIQUIS) 5 MG TABS tablet Take 1 tablet (5 mg total) by mouth 2 (two) times daily.   atorvastatin (LIPITOR) 20 MG tablet Take 1 tablet (20 mg  total) by mouth daily.   docusate sodium (COLACE) 100 MG capsule Take 1 capsule (100 mg total) by mouth daily as needed for mild constipation.   escitalopram (LEXAPRO) 5 MG tablet Take 10 mg by mouth daily.   mirtazapine (REMERON SOL-TAB) 15 MG disintegrating tablet Take 1 tablet (15 mg total) by mouth at bedtime.   traZODone (DESYREL) 50 MG tablet Take 50 mg by mouth at bedtime.   [DISCONTINUED] docusate sodium (COLACE) 100 MG capsule Take 1 capsule (100 mg total) by mouth daily.   [DISCONTINUED] magnesium hydroxide (MILK OF  MAGNESIA) 400 MG/5ML suspension Take 5 mLs by mouth daily as needed for mild constipation.   No facility-administered encounter medications on file as of 11/03/2022.    Review of Systems  Constitutional:  Negative for activity change, appetite change, chills, diaphoresis, fatigue, fever and unexpected weight change.  Respiratory:  Negative for cough, shortness of breath, wheezing and stridor.   Cardiovascular:  Negative for chest pain, palpitations and leg swelling.  Gastrointestinal:  Negative for abdominal distention, abdominal pain, constipation and diarrhea.  Genitourinary:  Negative for difficulty urinating and dysuria.  Musculoskeletal:  Positive for gait problem. Negative for arthralgias, back pain, joint swelling and myalgias.  Neurological:  Positive for weakness. Negative for dizziness, seizures, syncope, facial asymmetry, speech difficulty and headaches.  Hematological:  Negative for adenopathy. Does not bruise/bleed easily.  Psychiatric/Behavioral:  Positive for dysphoric mood. Negative for agitation, behavioral problems and confusion.     Immunization History  Administered Date(s) Administered   PFIZER(Purple Top)SARS-COV-2 Vaccination 05/02/2019, 05/26/2019   Pneumococcal Polysaccharide-23 07/06/2013   Pertinent  Health Maintenance Due  Topic Date Due   INFLUENZA VACCINE  10/19/2022      01/18/2015    8:32 AM 07/19/2015    8:07 AM 07/11/2022    2:20 PM  Fall Risk  Falls in the past year? No No 1  Was there an injury with Fall?   0  Fall Risk Category Calculator   1   Functional Status Survey:    Vitals:   11/03/22 1124  BP: (!) 149/76  Pulse: 75  Resp: 16  Temp: 98.2 F (36.8 C)  SpO2: 93%  Weight: 207 lb 3.2 oz (94 kg)   Body mass index is 28.1 kg/m. Physical Exam Vitals and nursing note reviewed.  Constitutional:      General: He is not in acute distress.    Appearance: He is not diaphoretic.  HENT:     Head: Normocephalic and atraumatic.      Mouth/Throat:     Mouth: Mucous membranes are moist.     Pharynx: Oropharynx is clear.  Neck:     Thyroid: No thyromegaly.     Vascular: No JVD.     Trachea: No tracheal deviation.  Cardiovascular:     Rate and Rhythm: Normal rate and regular rhythm.     Heart sounds: No murmur heard. Pulmonary:     Effort: Pulmonary effort is normal. No respiratory distress.     Breath sounds: Normal breath sounds. No wheezing.  Abdominal:     General: Bowel sounds are normal. There is no distension.     Palpations: Abdomen is soft.     Tenderness: There is no abdominal tenderness.  Musculoskeletal:     Comments: Right shoulder with reduced ROM. NO tenderness on palpation  Lymphadenopathy:     Cervical: No cervical adenopathy.  Skin:    General: Skin is warm and dry.  Neurological:     Mental Status:  He is alert and oriented to person, place, and time.     Comments: Left sided weakness.      Labs reviewed: Recent Labs    05/11/22 1246 05/12/22 0631 05/18/22 0454 05/19/22 0848 05/30/22 0606 09/01/22 0000 09/28/22 0000  NA 135   < > 137 134* 139 141 142  K 3.9   < > 3.8 3.7 3.8 3.8 3.9  CL 103   < > 104 99 104 106 106  CO2 27   < > 22 25 25 22  25*  GLUCOSE 123*   < > 127* 182* 93  --   --   BUN 19   < > 18 23 22 20 19   CREATININE 1.13   < > 0.98 1.14 1.26* 1.2 1.1  CALCIUM 8.5*   < > 9.1 9.2 8.9 8.7 8.8  PHOS 2.4*  --   --   --   --   --   --    < > = values in this interval not displayed.   Recent Labs    05/10/22 0005 05/11/22 1246 05/19/22 0848 09/01/22 0000  AST 27  --  33 21  ALT 20  --  48* 23  ALKPHOS 93  --  99 128*  BILITOT 0.4  --  1.0  --   PROT 7.0  --  6.8  --   ALBUMIN 4.1 3.5 3.6 3.6   Recent Labs    05/10/22 0545 05/11/22 1246 05/18/22 0454 05/19/22 0848 05/30/22 0606 09/01/22 0000  WBC 11.6*   < > 8.1 8.1 7.5 6.0  NEUTROABS 9.2*  --   --  5.8 4.7  --   HGB 16.0   < > 15.7 16.3 15.2 14.0  HCT 46.7   < > 44.0 46.0 43.0 41  MCV 97.3   < > 94.4  96.2 97.1  --   PLT 164   < > 198 210 149* 150   < > = values in this interval not displayed.   Lab Results  Component Value Date   TSH 2.23 09/01/2022   Lab Results  Component Value Date   HGBA1C 5.8 (H) 05/10/2022   Lab Results  Component Value Date   CHOL 134 05/10/2022   HDL 40 (L) 05/10/2022   LDLCALC 56 05/10/2022   TRIG 190 (H) 05/10/2022   CHOLHDL 3.4 05/10/2022    Significant Diagnostic Results in last 30 days:  No results found.  Assessment/Plan  1. Right middle cerebral artery stroke [facility]) Feb of 2024 Led to #2 Followed by neurology   2. Hemiparesis of left nondominant side as late effect of cerebral infarction (HCC) Continues to work with therapy at [facility] Will be transitioning to skilled care at the [place], ok for discharge when bed available.   3. Paroxysmal atrial fibrillation (HCC) Continue Eliquis for CVA risk reduction  4. Essential hypertension Controlled  Continue Norvasc   5. Primary insomnia Continue Remeron for mood and sleep   6. Mixed hyperlipidemia Continue Lipitor   7. Rotator cuff arthropathy of right shoulder Continue to work with therapy  No other intervention per ortho  8. Depression Improved, continue remeron and lexapro   Family/ staff Communication: resident and nurse, wife at bedside  Labs/tests ordered:  NA   Discharge review, assessment, plan, and coordination took >30 min

## 2022-11-07 DIAGNOSIS — G8929 Other chronic pain: Secondary | ICD-10-CM | POA: Diagnosis not present

## 2022-11-07 DIAGNOSIS — I63511 Cerebral infarction due to unspecified occlusion or stenosis of right middle cerebral artery: Secondary | ICD-10-CM | POA: Diagnosis not present

## 2022-11-07 DIAGNOSIS — I69354 Hemiplegia and hemiparesis following cerebral infarction affecting left non-dominant side: Secondary | ICD-10-CM | POA: Diagnosis not present

## 2022-11-07 DIAGNOSIS — E785 Hyperlipidemia, unspecified: Secondary | ICD-10-CM | POA: Diagnosis not present

## 2022-11-07 DIAGNOSIS — I482 Chronic atrial fibrillation, unspecified: Secondary | ICD-10-CM | POA: Diagnosis not present

## 2022-11-10 ENCOUNTER — Encounter: Payer: Medicare Other | Attending: Physical Medicine & Rehabilitation | Admitting: Physical Medicine & Rehabilitation

## 2022-11-10 ENCOUNTER — Encounter: Payer: Self-pay | Admitting: Physical Medicine & Rehabilitation

## 2022-11-10 VITALS — BP 135/78 | HR 69 | Ht 72.0 in

## 2022-11-10 DIAGNOSIS — I63511 Cerebral infarction due to unspecified occlusion or stenosis of right middle cerebral artery: Secondary | ICD-10-CM | POA: Insufficient documentation

## 2022-11-10 NOTE — Progress Notes (Signed)
Subjective:    Patient ID: Edward Maynard, male    DOB: 1933-04-13, 89 y.o.   MRN: [id number] 89 y.o. 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  Per chart review lives with spouse.  Two-level home.  Reportedly independent prior to admission.  Wife with dementia and limited assistance.  Presented 05/10/2022 with acute onset of left-sided weakness resulting in a fall.  Admission chemistries unremarkable except glucose 158 creatinine 1.33, hemoglobin A1c 5.8.  Cranial CT scan negative.  CT cervical spine negative.  CT angiogram of head and neck showed occlusion of the proximal M2 segment inferior division of the right MCA.  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  Nonspecific 15 mm right parotid nodule.  Patient did not receive tPA.  Interventional radiology consulted underwent revascularization of right M2 distal occlusion.  Echocardiogram with ejection fraction of 60 to 65% no wall motion abnormality grade 2 diastolic dysfunction.  Neurology follow-up patient transitioned from Xarelto to Eliquis for CVA prophylaxis/atrial fibrillation.  Currently on a dysphagia #2 thin liquid diet.  Therapy evaluations completed due to patient decreased functional mobility and left-sided weakness was admitted for a comprehensive rehab program.   Admit date: 05/18/2022 Discharge date: 06/08/2022 HPI   Pt is now in a [place] [facility] in [place] Right shoulder OA glenohumeral and AC jt  Walked in therapy 50' x 3 with hand rail on right side , followed with WC, pt does not remember if someone was holding him.   Unable to maintain grasp with left hand due to poor sensation  Pain Inventory Average Pain 0 Pain Right Now 0 My pain is  no  pain  LOCATION OF PAIN  no pain  BOWEL Number of stools per week:  3 -4 Oral laxative use Yes  Type of laxative docusate Enema or suppository use  no History of colostomy No  Incontinent No   BLADDER Normal    Mobility ability to climb steps?  no do you drive?  no use a wheelchair needs help with transfers Do you have any goals in this area?  yes  Function retired I need assistance with the following:  dressing, bathing, toileting, meal prep, household duties, and shopping Do you have any goals in this area?  yes  Neuro/Psych weakness trouble walking anxiety  Prior Studies Any changes since last visit?  no  Physicians involved in your care Any changes since last visit?  no   Family History  Problem Relation Age of Onset   Hypertension Mother    Hypertension Father    Stroke Father    COPD Brother    Stroke Brother    Colon cancer Neg Hx    Heart attack Neg Hx    Social History   Socioeconomic History   Marital status: Married    Spouse name: Edward Maynard   Number of children: 2   Years of education: college   Highest education level: Not on file  Occupational History   Occupation: retired  Tobacco Use   Smoking status: Former    Current packs/day: 0.00    Types: Cigarettes    Quit date: 03/20/1970    Years since quitting: 52.6   Smokeless tobacco: Never  Vaping Use   Vaping status: Never Used  Substance and Sexual Activity  Alcohol use: No   Drug use: No   Sexual activity: Yes  Other Topics Concern   Not on file  Social History Narrative   Patient right handed   Patient lives with his wife   Patient coffee daily   Enjoys playing golf   Social Determinants of Health   Financial Resource Strain: Not on file  Food Insecurity: Not on file  Transportation Needs: Not on file  Physical Activity: Not on file  Stress: Not on file  Social Connections: Not on file   Past Surgical History:  Procedure Laterality Date   IR CT HEAD LTD  05/10/2022    IR PERCUTANEOUS ART THROMBECTOMY/INFUSION INTRACRANIAL INC DIAG ANGIO  05/10/2022   LOOP RECORDER IMPLANT N/A 10/31/2013   Procedure: LOOP RECORDER IMPLANT;  Surgeon: Gardiner Rhyme, MD;  Location: [facility]ATH [facility];  Service: Cardiovascular;  Laterality: N/A;   RADIOLOGY WITH ANESTHESIA N/A 05/10/2022   Procedure: IR WITH ANESTHESIA;  Surgeon: Julieanne Cotton, MD;  Location: [facility] OR;  Service: Radiology;  Laterality: N/A;   SHOULDER SURGERY Bilateral 2003, 1995   Past Medical History:  Diagnosis Date   High cholesterol    05/2013; no defecits   Hyperlipemia    Left arm weakness    Left hand weakness    Personal history of colonic polyp - adenoma  10/23/2013   Stroke (HCC) 05/2013   MRI on 07/02/13 = Multifocal acute & subacute infarction involving right frontal MCA/ACA & left parietal MCA/PCA watershed areas   BP 135/78   Pulse 69   Ht 6' (1.829 m)   BMI 28.10 kg/m   Opioid Risk Score:   Fall Risk Score:  `1  Depression screen [facility] 2/9     07/11/2022    2:21 PM 01/18/2015    8:32 AM  Depression screen PHQ 2/9  Decreased Interest 3 0  Down, Depressed, Hopeless 3 0  PHQ - 2 Score 6 0  Altered sleeping 3   Tired, decreased energy 3   Change in appetite 2   Feeling bad or failure about yourself  2   Trouble concentrating 2   Moving slowly or fidgety/restless 1   Suicidal thoughts 1   PHQ-9 Score 20   Difficult doing work/chores Not difficult at all     Review of Systems  Musculoskeletal:  Positive for gait problem.  Neurological:  Positive for weakness.  Psychiatric/Behavioral:         Anxiety  All other systems reviewed and are negative.      Objective:   Physical Exam Vitals and nursing note reviewed.  Constitutional:      Appearance: Normal appearance.  HENT:     Head: Normocephalic and atraumatic.  Eyes:     Extraocular Movements: Extraocular movements intact.     Conjunctiva/sclera: Conjunctivae normal.     Pupils: Pupils are equal, round, and reactive to light.   Musculoskeletal:     Right lower leg: No edema.     Left lower leg: No edema.  Skin:    General: Skin is warm and dry.  Neurological:     General: No focal deficit present.     Mental Status: He is alert and oriented to person, place, and time.  Psychiatric:        Mood and Affect: Mood normal.        Behavior: Behavior normal.    Motor strength is 5/5 in the right finger flexors extensors as well as elbow flexion extension.  Shoulder abduction 3 -,  no pain Negative impingement testing on the right side has good external rotation. Right lower extremity strength is normal Left upper extremity strength 4 -/5 in the deltoid bicep tricep finger flexors and extensors.  He does have Dupuytren's contracture left hand. Left lower extremity has 4/5 strength hip flexor knee extensor ankle dorsiflexor Patient has dysmetria with sensory ataxia left upper extremity No sensation to light touch in the left upper extremity.       Assessment & Plan:  #1.  Right MCA infarct with chronic left hemiparesis and severe sensory deficits.  In addition he has right glenohumeral, acromioclavicular OA as well as rotator cuff degeneration.  We discussed that his sensory symptoms are unlikely to improve over time.  He is making some improvements from a mobility standpoint, recommend continued PT OT He is now at a [place] facility, [facility].  I will see him back on a as needed basis.  Discussed with patient and his daughter agree with plan.

## 2022-11-15 DIAGNOSIS — I480 Paroxysmal atrial fibrillation: Secondary | ICD-10-CM | POA: Diagnosis not present

## 2022-11-15 DIAGNOSIS — I69354 Hemiplegia and hemiparesis following cerebral infarction affecting left non-dominant side: Secondary | ICD-10-CM | POA: Diagnosis not present

## 2022-11-15 DIAGNOSIS — E785 Hyperlipidemia, unspecified: Secondary | ICD-10-CM | POA: Diagnosis not present

## 2022-11-15 DIAGNOSIS — M75101 Unspecified rotator cuff tear or rupture of right shoulder, not specified as traumatic: Secondary | ICD-10-CM | POA: Diagnosis not present

## 2022-11-15 DIAGNOSIS — I10 Essential (primary) hypertension: Secondary | ICD-10-CM | POA: Diagnosis not present

## 2022-11-15 DIAGNOSIS — G4700 Insomnia, unspecified: Secondary | ICD-10-CM | POA: Diagnosis not present

## 2022-11-30 DIAGNOSIS — G8194 Hemiplegia, unspecified affecting left nondominant side: Secondary | ICD-10-CM | POA: Diagnosis not present

## 2022-11-30 DIAGNOSIS — I63511 Cerebral infarction due to unspecified occlusion or stenosis of right middle cerebral artery: Secondary | ICD-10-CM | POA: Diagnosis not present

## 2022-12-03 DIAGNOSIS — M6281 Muscle weakness (generalized): Secondary | ICD-10-CM | POA: Diagnosis not present

## 2022-12-03 DIAGNOSIS — R279 Unspecified lack of coordination: Secondary | ICD-10-CM | POA: Diagnosis not present

## 2022-12-03 DIAGNOSIS — R2681 Unsteadiness on feet: Secondary | ICD-10-CM | POA: Diagnosis not present

## 2022-12-14 DIAGNOSIS — R279 Unspecified lack of coordination: Secondary | ICD-10-CM | POA: Diagnosis not present

## 2022-12-14 DIAGNOSIS — M6281 Muscle weakness (generalized): Secondary | ICD-10-CM | POA: Diagnosis not present

## 2022-12-14 DIAGNOSIS — R2681 Unsteadiness on feet: Secondary | ICD-10-CM | POA: Diagnosis not present

## 2022-12-16 DIAGNOSIS — G8194 Hemiplegia, unspecified affecting left nondominant side: Secondary | ICD-10-CM | POA: Diagnosis not present

## 2022-12-16 DIAGNOSIS — I63511 Cerebral infarction due to unspecified occlusion or stenosis of right middle cerebral artery: Secondary | ICD-10-CM | POA: Diagnosis not present

## 2022-12-19 DIAGNOSIS — I63511 Cerebral infarction due to unspecified occlusion or stenosis of right middle cerebral artery: Secondary | ICD-10-CM | POA: Diagnosis not present

## 2022-12-19 DIAGNOSIS — G8194 Hemiplegia, unspecified affecting left nondominant side: Secondary | ICD-10-CM | POA: Diagnosis not present

## 2022-12-21 DIAGNOSIS — R279 Unspecified lack of coordination: Secondary | ICD-10-CM | POA: Diagnosis not present

## 2022-12-21 DIAGNOSIS — M6281 Muscle weakness (generalized): Secondary | ICD-10-CM | POA: Diagnosis not present

## 2022-12-21 DIAGNOSIS — R2681 Unsteadiness on feet: Secondary | ICD-10-CM | POA: Diagnosis not present

## 2022-12-23 DIAGNOSIS — I63511 Cerebral infarction due to unspecified occlusion or stenosis of right middle cerebral artery: Secondary | ICD-10-CM | POA: Diagnosis not present

## 2022-12-23 DIAGNOSIS — G8194 Hemiplegia, unspecified affecting left nondominant side: Secondary | ICD-10-CM | POA: Diagnosis not present

## 2022-12-26 DIAGNOSIS — G8194 Hemiplegia, unspecified affecting left nondominant side: Secondary | ICD-10-CM | POA: Diagnosis not present

## 2022-12-26 DIAGNOSIS — M25511 Pain in right shoulder: Secondary | ICD-10-CM | POA: Diagnosis not present

## 2022-12-26 DIAGNOSIS — K5909 Other constipation: Secondary | ICD-10-CM | POA: Diagnosis not present

## 2022-12-26 DIAGNOSIS — I10 Essential (primary) hypertension: Secondary | ICD-10-CM | POA: Diagnosis not present

## 2022-12-26 DIAGNOSIS — E785 Hyperlipidemia, unspecified: Secondary | ICD-10-CM | POA: Diagnosis not present

## 2022-12-26 DIAGNOSIS — G4700 Insomnia, unspecified: Secondary | ICD-10-CM | POA: Diagnosis not present

## 2022-12-26 DIAGNOSIS — R490 Dysphonia: Secondary | ICD-10-CM | POA: Diagnosis not present

## 2022-12-26 DIAGNOSIS — I63511 Cerebral infarction due to unspecified occlusion or stenosis of right middle cerebral artery: Secondary | ICD-10-CM | POA: Diagnosis not present

## 2022-12-26 DIAGNOSIS — I480 Paroxysmal atrial fibrillation: Secondary | ICD-10-CM | POA: Diagnosis not present

## 2022-12-27 DIAGNOSIS — M6281 Muscle weakness (generalized): Secondary | ICD-10-CM | POA: Diagnosis not present

## 2022-12-27 DIAGNOSIS — R2681 Unsteadiness on feet: Secondary | ICD-10-CM | POA: Diagnosis not present

## 2022-12-27 DIAGNOSIS — R279 Unspecified lack of coordination: Secondary | ICD-10-CM | POA: Diagnosis not present

## 2022-12-31 DIAGNOSIS — I63511 Cerebral infarction due to unspecified occlusion or stenosis of right middle cerebral artery: Secondary | ICD-10-CM | POA: Diagnosis not present

## 2022-12-31 DIAGNOSIS — G8194 Hemiplegia, unspecified affecting left nondominant side: Secondary | ICD-10-CM | POA: Diagnosis not present

## 2023-01-01 DIAGNOSIS — M6281 Muscle weakness (generalized): Secondary | ICD-10-CM | POA: Diagnosis not present

## 2023-01-01 DIAGNOSIS — R2681 Unsteadiness on feet: Secondary | ICD-10-CM | POA: Diagnosis not present

## 2023-01-01 DIAGNOSIS — R279 Unspecified lack of coordination: Secondary | ICD-10-CM | POA: Diagnosis not present

## 2023-01-04 DIAGNOSIS — I63511 Cerebral infarction due to unspecified occlusion or stenosis of right middle cerebral artery: Secondary | ICD-10-CM | POA: Diagnosis not present

## 2023-01-04 DIAGNOSIS — G8194 Hemiplegia, unspecified affecting left nondominant side: Secondary | ICD-10-CM | POA: Diagnosis not present

## 2023-01-09 DIAGNOSIS — I63511 Cerebral infarction due to unspecified occlusion or stenosis of right middle cerebral artery: Secondary | ICD-10-CM | POA: Diagnosis not present

## 2023-01-09 DIAGNOSIS — M6281 Muscle weakness (generalized): Secondary | ICD-10-CM | POA: Diagnosis not present

## 2023-01-09 DIAGNOSIS — W19XXXA Unspecified fall, initial encounter: Secondary | ICD-10-CM | POA: Diagnosis not present

## 2023-01-09 DIAGNOSIS — G8194 Hemiplegia, unspecified affecting left nondominant side: Secondary | ICD-10-CM | POA: Diagnosis not present

## 2023-01-09 DIAGNOSIS — I69354 Hemiplegia and hemiparesis following cerebral infarction affecting left non-dominant side: Secondary | ICD-10-CM | POA: Diagnosis not present

## 2023-01-10 DIAGNOSIS — Z9181 History of falling: Secondary | ICD-10-CM | POA: Diagnosis not present

## 2023-01-10 DIAGNOSIS — I69354 Hemiplegia and hemiparesis following cerebral infarction affecting left non-dominant side: Secondary | ICD-10-CM | POA: Diagnosis not present

## 2023-01-10 DIAGNOSIS — M6281 Muscle weakness (generalized): Secondary | ICD-10-CM | POA: Diagnosis not present

## 2023-01-10 DIAGNOSIS — R279 Unspecified lack of coordination: Secondary | ICD-10-CM | POA: Diagnosis not present

## 2023-01-10 DIAGNOSIS — R2681 Unsteadiness on feet: Secondary | ICD-10-CM | POA: Diagnosis not present

## 2023-01-10 DIAGNOSIS — Z7901 Long term (current) use of anticoagulants: Secondary | ICD-10-CM | POA: Diagnosis not present

## 2023-01-14 DIAGNOSIS — I63511 Cerebral infarction due to unspecified occlusion or stenosis of right middle cerebral artery: Secondary | ICD-10-CM | POA: Diagnosis not present

## 2023-01-14 DIAGNOSIS — G8194 Hemiplegia, unspecified affecting left nondominant side: Secondary | ICD-10-CM | POA: Diagnosis not present

## 2023-01-15 DIAGNOSIS — R279 Unspecified lack of coordination: Secondary | ICD-10-CM | POA: Diagnosis not present

## 2023-01-15 DIAGNOSIS — M6281 Muscle weakness (generalized): Secondary | ICD-10-CM | POA: Diagnosis not present

## 2023-01-15 DIAGNOSIS — R2681 Unsteadiness on feet: Secondary | ICD-10-CM | POA: Diagnosis not present

## 2023-01-17 DIAGNOSIS — R279 Unspecified lack of coordination: Secondary | ICD-10-CM | POA: Diagnosis not present

## 2023-01-17 DIAGNOSIS — R2681 Unsteadiness on feet: Secondary | ICD-10-CM | POA: Diagnosis not present

## 2023-01-17 DIAGNOSIS — M6281 Muscle weakness (generalized): Secondary | ICD-10-CM | POA: Diagnosis not present

## 2023-01-18 DIAGNOSIS — I63511 Cerebral infarction due to unspecified occlusion or stenosis of right middle cerebral artery: Secondary | ICD-10-CM | POA: Diagnosis not present

## 2023-01-18 DIAGNOSIS — G8194 Hemiplegia, unspecified affecting left nondominant side: Secondary | ICD-10-CM | POA: Diagnosis not present

## 2023-01-22 DIAGNOSIS — R279 Unspecified lack of coordination: Secondary | ICD-10-CM | POA: Diagnosis not present

## 2023-01-22 DIAGNOSIS — R2681 Unsteadiness on feet: Secondary | ICD-10-CM | POA: Diagnosis not present

## 2023-01-22 DIAGNOSIS — M6281 Muscle weakness (generalized): Secondary | ICD-10-CM | POA: Diagnosis not present

## 2023-01-23 DIAGNOSIS — M2041 Other hammer toe(s) (acquired), right foot: Secondary | ICD-10-CM | POA: Diagnosis not present

## 2023-01-23 DIAGNOSIS — I10 Essential (primary) hypertension: Secondary | ICD-10-CM | POA: Diagnosis not present

## 2023-01-23 DIAGNOSIS — I480 Paroxysmal atrial fibrillation: Secondary | ICD-10-CM | POA: Diagnosis not present

## 2023-01-23 DIAGNOSIS — B351 Tinea unguium: Secondary | ICD-10-CM | POA: Diagnosis not present

## 2023-01-23 DIAGNOSIS — E785 Hyperlipidemia, unspecified: Secondary | ICD-10-CM | POA: Diagnosis not present

## 2023-01-23 DIAGNOSIS — K5909 Other constipation: Secondary | ICD-10-CM | POA: Diagnosis not present

## 2023-01-25 DIAGNOSIS — E119 Type 2 diabetes mellitus without complications: Secondary | ICD-10-CM | POA: Diagnosis not present

## 2023-01-25 DIAGNOSIS — E789 Disorder of lipoprotein metabolism, unspecified: Secondary | ICD-10-CM | POA: Diagnosis not present

## 2023-01-25 DIAGNOSIS — G8194 Hemiplegia, unspecified affecting left nondominant side: Secondary | ICD-10-CM | POA: Diagnosis not present

## 2023-01-25 DIAGNOSIS — I63511 Cerebral infarction due to unspecified occlusion or stenosis of right middle cerebral artery: Secondary | ICD-10-CM | POA: Diagnosis not present

## 2023-01-25 DIAGNOSIS — E559 Vitamin D deficiency, unspecified: Secondary | ICD-10-CM | POA: Diagnosis not present

## 2023-01-25 DIAGNOSIS — I10 Essential (primary) hypertension: Secondary | ICD-10-CM | POA: Diagnosis not present

## 2023-01-25 DIAGNOSIS — E039 Hypothyroidism, unspecified: Secondary | ICD-10-CM | POA: Diagnosis not present

## 2023-01-26 DIAGNOSIS — R2681 Unsteadiness on feet: Secondary | ICD-10-CM | POA: Diagnosis not present

## 2023-01-26 DIAGNOSIS — R279 Unspecified lack of coordination: Secondary | ICD-10-CM | POA: Diagnosis not present

## 2023-01-26 DIAGNOSIS — M6281 Muscle weakness (generalized): Secondary | ICD-10-CM | POA: Diagnosis not present

## 2023-01-28 DIAGNOSIS — I63511 Cerebral infarction due to unspecified occlusion or stenosis of right middle cerebral artery: Secondary | ICD-10-CM | POA: Diagnosis not present

## 2023-01-28 DIAGNOSIS — G8194 Hemiplegia, unspecified affecting left nondominant side: Secondary | ICD-10-CM | POA: Diagnosis not present

## 2023-01-29 DIAGNOSIS — R279 Unspecified lack of coordination: Secondary | ICD-10-CM | POA: Diagnosis not present

## 2023-01-29 DIAGNOSIS — R2681 Unsteadiness on feet: Secondary | ICD-10-CM | POA: Diagnosis not present

## 2023-01-29 DIAGNOSIS — M6281 Muscle weakness (generalized): Secondary | ICD-10-CM | POA: Diagnosis not present

## 2023-01-30 DIAGNOSIS — I63511 Cerebral infarction due to unspecified occlusion or stenosis of right middle cerebral artery: Secondary | ICD-10-CM | POA: Diagnosis not present

## 2023-01-30 DIAGNOSIS — G8194 Hemiplegia, unspecified affecting left nondominant side: Secondary | ICD-10-CM | POA: Diagnosis not present

## 2023-01-31 DIAGNOSIS — R279 Unspecified lack of coordination: Secondary | ICD-10-CM | POA: Diagnosis not present

## 2023-01-31 DIAGNOSIS — M6281 Muscle weakness (generalized): Secondary | ICD-10-CM | POA: Diagnosis not present

## 2023-01-31 DIAGNOSIS — R2681 Unsteadiness on feet: Secondary | ICD-10-CM | POA: Diagnosis not present

## 2023-02-01 DIAGNOSIS — G8194 Hemiplegia, unspecified affecting left nondominant side: Secondary | ICD-10-CM | POA: Diagnosis not present

## 2023-02-01 DIAGNOSIS — I63511 Cerebral infarction due to unspecified occlusion or stenosis of right middle cerebral artery: Secondary | ICD-10-CM | POA: Diagnosis not present

## 2023-02-04 DIAGNOSIS — R279 Unspecified lack of coordination: Secondary | ICD-10-CM | POA: Diagnosis not present

## 2023-02-04 DIAGNOSIS — M6281 Muscle weakness (generalized): Secondary | ICD-10-CM | POA: Diagnosis not present

## 2023-02-04 DIAGNOSIS — R2681 Unsteadiness on feet: Secondary | ICD-10-CM | POA: Diagnosis not present

## 2023-02-06 DIAGNOSIS — G8194 Hemiplegia, unspecified affecting left nondominant side: Secondary | ICD-10-CM | POA: Diagnosis not present

## 2023-02-06 DIAGNOSIS — I63511 Cerebral infarction due to unspecified occlusion or stenosis of right middle cerebral artery: Secondary | ICD-10-CM | POA: Diagnosis not present

## 2023-02-12 DIAGNOSIS — R2681 Unsteadiness on feet: Secondary | ICD-10-CM | POA: Diagnosis not present

## 2023-02-12 DIAGNOSIS — M6281 Muscle weakness (generalized): Secondary | ICD-10-CM | POA: Diagnosis not present

## 2023-02-12 DIAGNOSIS — R279 Unspecified lack of coordination: Secondary | ICD-10-CM | POA: Diagnosis not present

## 2023-02-13 DIAGNOSIS — I63511 Cerebral infarction due to unspecified occlusion or stenosis of right middle cerebral artery: Secondary | ICD-10-CM | POA: Diagnosis not present

## 2023-02-13 DIAGNOSIS — G8194 Hemiplegia, unspecified affecting left nondominant side: Secondary | ICD-10-CM | POA: Diagnosis not present

## 2023-02-14 DIAGNOSIS — R2681 Unsteadiness on feet: Secondary | ICD-10-CM | POA: Diagnosis not present

## 2023-02-14 DIAGNOSIS — M6281 Muscle weakness (generalized): Secondary | ICD-10-CM | POA: Diagnosis not present

## 2023-02-14 DIAGNOSIS — R279 Unspecified lack of coordination: Secondary | ICD-10-CM | POA: Diagnosis not present

## 2023-02-17 DIAGNOSIS — G8194 Hemiplegia, unspecified affecting left nondominant side: Secondary | ICD-10-CM | POA: Diagnosis not present

## 2023-02-17 DIAGNOSIS — I63511 Cerebral infarction due to unspecified occlusion or stenosis of right middle cerebral artery: Secondary | ICD-10-CM | POA: Diagnosis not present

## 2023-02-19 DIAGNOSIS — R2681 Unsteadiness on feet: Secondary | ICD-10-CM | POA: Diagnosis not present

## 2023-02-19 DIAGNOSIS — M6281 Muscle weakness (generalized): Secondary | ICD-10-CM | POA: Diagnosis not present

## 2023-02-19 DIAGNOSIS — R279 Unspecified lack of coordination: Secondary | ICD-10-CM | POA: Diagnosis not present

## 2023-02-20 DIAGNOSIS — G4700 Insomnia, unspecified: Secondary | ICD-10-CM | POA: Diagnosis not present

## 2023-02-20 DIAGNOSIS — I69354 Hemiplegia and hemiparesis following cerebral infarction affecting left non-dominant side: Secondary | ICD-10-CM | POA: Diagnosis not present

## 2023-02-20 DIAGNOSIS — M6281 Muscle weakness (generalized): Secondary | ICD-10-CM | POA: Diagnosis not present

## 2023-02-21 DIAGNOSIS — R2681 Unsteadiness on feet: Secondary | ICD-10-CM | POA: Diagnosis not present

## 2023-02-21 DIAGNOSIS — R279 Unspecified lack of coordination: Secondary | ICD-10-CM | POA: Diagnosis not present

## 2023-02-21 DIAGNOSIS — M6281 Muscle weakness (generalized): Secondary | ICD-10-CM | POA: Diagnosis not present

## 2023-02-25 DIAGNOSIS — I63511 Cerebral infarction due to unspecified occlusion or stenosis of right middle cerebral artery: Secondary | ICD-10-CM | POA: Diagnosis not present

## 2023-02-25 DIAGNOSIS — G8194 Hemiplegia, unspecified affecting left nondominant side: Secondary | ICD-10-CM | POA: Diagnosis not present

## 2023-02-26 DIAGNOSIS — M6281 Muscle weakness (generalized): Secondary | ICD-10-CM | POA: Diagnosis not present

## 2023-02-26 DIAGNOSIS — R279 Unspecified lack of coordination: Secondary | ICD-10-CM | POA: Diagnosis not present

## 2023-02-26 DIAGNOSIS — R2681 Unsteadiness on feet: Secondary | ICD-10-CM | POA: Diagnosis not present

## 2023-02-27 DIAGNOSIS — H6123 Impacted cerumen, bilateral: Secondary | ICD-10-CM | POA: Diagnosis not present

## 2023-02-27 DIAGNOSIS — L240 Irritant contact dermatitis due to detergents: Secondary | ICD-10-CM | POA: Diagnosis not present

## 2023-02-28 DIAGNOSIS — R279 Unspecified lack of coordination: Secondary | ICD-10-CM | POA: Diagnosis not present

## 2023-02-28 DIAGNOSIS — M6281 Muscle weakness (generalized): Secondary | ICD-10-CM | POA: Diagnosis not present

## 2023-02-28 DIAGNOSIS — R2681 Unsteadiness on feet: Secondary | ICD-10-CM | POA: Diagnosis not present

## 2023-03-04 DIAGNOSIS — R279 Unspecified lack of coordination: Secondary | ICD-10-CM | POA: Diagnosis not present

## 2023-03-04 DIAGNOSIS — M6281 Muscle weakness (generalized): Secondary | ICD-10-CM | POA: Diagnosis not present

## 2023-03-04 DIAGNOSIS — R2681 Unsteadiness on feet: Secondary | ICD-10-CM | POA: Diagnosis not present

## 2023-03-06 DIAGNOSIS — G8194 Hemiplegia, unspecified affecting left nondominant side: Secondary | ICD-10-CM | POA: Diagnosis not present

## 2023-03-06 DIAGNOSIS — I63511 Cerebral infarction due to unspecified occlusion or stenosis of right middle cerebral artery: Secondary | ICD-10-CM | POA: Diagnosis not present

## 2023-03-08 DIAGNOSIS — R2681 Unsteadiness on feet: Secondary | ICD-10-CM | POA: Diagnosis not present

## 2023-03-08 DIAGNOSIS — R279 Unspecified lack of coordination: Secondary | ICD-10-CM | POA: Diagnosis not present

## 2023-03-08 DIAGNOSIS — M6281 Muscle weakness (generalized): Secondary | ICD-10-CM | POA: Diagnosis not present

## 2023-03-11 DIAGNOSIS — G8194 Hemiplegia, unspecified affecting left nondominant side: Secondary | ICD-10-CM | POA: Diagnosis not present

## 2023-03-11 DIAGNOSIS — I63511 Cerebral infarction due to unspecified occlusion or stenosis of right middle cerebral artery: Secondary | ICD-10-CM | POA: Diagnosis not present

## 2023-03-12 DIAGNOSIS — R279 Unspecified lack of coordination: Secondary | ICD-10-CM | POA: Diagnosis not present

## 2023-03-12 DIAGNOSIS — M6281 Muscle weakness (generalized): Secondary | ICD-10-CM | POA: Diagnosis not present

## 2023-03-12 DIAGNOSIS — R2681 Unsteadiness on feet: Secondary | ICD-10-CM | POA: Diagnosis not present

## 2023-03-16 DIAGNOSIS — M6281 Muscle weakness (generalized): Secondary | ICD-10-CM | POA: Diagnosis not present

## 2023-03-16 DIAGNOSIS — R279 Unspecified lack of coordination: Secondary | ICD-10-CM | POA: Diagnosis not present

## 2023-03-16 DIAGNOSIS — I69354 Hemiplegia and hemiparesis following cerebral infarction affecting left non-dominant side: Secondary | ICD-10-CM | POA: Diagnosis not present

## 2023-03-16 DIAGNOSIS — I10 Essential (primary) hypertension: Secondary | ICD-10-CM | POA: Diagnosis not present

## 2023-03-16 DIAGNOSIS — R2681 Unsteadiness on feet: Secondary | ICD-10-CM | POA: Diagnosis not present

## 2023-03-17 DIAGNOSIS — I63511 Cerebral infarction due to unspecified occlusion or stenosis of right middle cerebral artery: Secondary | ICD-10-CM | POA: Diagnosis not present

## 2023-03-17 DIAGNOSIS — G8194 Hemiplegia, unspecified affecting left nondominant side: Secondary | ICD-10-CM | POA: Diagnosis not present

## 2023-03-20 DIAGNOSIS — I480 Paroxysmal atrial fibrillation: Secondary | ICD-10-CM | POA: Diagnosis not present

## 2023-03-20 DIAGNOSIS — K5909 Other constipation: Secondary | ICD-10-CM | POA: Diagnosis not present

## 2023-03-20 DIAGNOSIS — Z7901 Long term (current) use of anticoagulants: Secondary | ICD-10-CM | POA: Diagnosis not present

## 2023-03-20 DIAGNOSIS — E538 Deficiency of other specified B group vitamins: Secondary | ICD-10-CM | POA: Diagnosis not present

## 2023-03-20 DIAGNOSIS — M75101 Unspecified rotator cuff tear or rupture of right shoulder, not specified as traumatic: Secondary | ICD-10-CM | POA: Diagnosis not present

## 2023-03-20 DIAGNOSIS — M25511 Pain in right shoulder: Secondary | ICD-10-CM | POA: Diagnosis not present

## 2023-07-17 DIAGNOSIS — I10 Essential (primary) hypertension: Secondary | ICD-10-CM | POA: Diagnosis not present

## 2023-07-17 DIAGNOSIS — I4891 Unspecified atrial fibrillation: Secondary | ICD-10-CM | POA: Diagnosis not present

## 2023-07-17 DIAGNOSIS — I69354 Hemiplegia and hemiparesis following cerebral infarction affecting left non-dominant side: Secondary | ICD-10-CM | POA: Diagnosis not present

## 2023-07-18 DIAGNOSIS — I69359 Hemiplegia and hemiparesis following cerebral infarction affecting unspecified side: Secondary | ICD-10-CM | POA: Diagnosis not present

## 2023-07-18 DIAGNOSIS — I639 Cerebral infarction, unspecified: Secondary | ICD-10-CM | POA: Diagnosis not present

## 2023-07-27 DIAGNOSIS — G4700 Insomnia, unspecified: Secondary | ICD-10-CM | POA: Diagnosis not present

## 2023-07-31 DIAGNOSIS — M79675 Pain in left toe(s): Secondary | ICD-10-CM | POA: Diagnosis not present

## 2023-07-31 DIAGNOSIS — B351 Tinea unguium: Secondary | ICD-10-CM | POA: Diagnosis not present

## 2023-08-08 DIAGNOSIS — Z8673 Personal history of transient ischemic attack (TIA), and cerebral infarction without residual deficits: Secondary | ICD-10-CM | POA: Diagnosis not present

## 2023-08-08 DIAGNOSIS — I69352 Hemiplegia and hemiparesis following cerebral infarction affecting left dominant side: Secondary | ICD-10-CM | POA: Diagnosis not present

## 2023-08-08 DIAGNOSIS — I10 Essential (primary) hypertension: Secondary | ICD-10-CM | POA: Diagnosis not present

## 2023-08-08 DIAGNOSIS — Z7901 Long term (current) use of anticoagulants: Secondary | ICD-10-CM | POA: Diagnosis not present

## 2023-08-08 DIAGNOSIS — I4891 Unspecified atrial fibrillation: Secondary | ICD-10-CM | POA: Diagnosis not present

## 2023-08-09 DIAGNOSIS — I69390 Apraxia following cerebral infarction: Secondary | ICD-10-CM | POA: Diagnosis not present

## 2023-08-09 DIAGNOSIS — M24542 Contracture, left hand: Secondary | ICD-10-CM | POA: Diagnosis not present

## 2023-08-09 DIAGNOSIS — M6259 Muscle wasting and atrophy, not elsewhere classified, multiple sites: Secondary | ICD-10-CM | POA: Diagnosis not present

## 2023-08-09 DIAGNOSIS — M75101 Unspecified rotator cuff tear or rupture of right shoulder, not specified as traumatic: Secondary | ICD-10-CM | POA: Diagnosis not present

## 2023-08-09 DIAGNOSIS — M24532 Contracture, left wrist: Secondary | ICD-10-CM | POA: Diagnosis not present

## 2023-08-09 DIAGNOSIS — H5452A2 Low vision left eye category 2, normal vision right eye: Secondary | ICD-10-CM | POA: Diagnosis not present

## 2023-08-14 DIAGNOSIS — G4700 Insomnia, unspecified: Secondary | ICD-10-CM | POA: Diagnosis not present

## 2023-08-14 DIAGNOSIS — E559 Vitamin D deficiency, unspecified: Secondary | ICD-10-CM | POA: Diagnosis not present

## 2023-08-14 DIAGNOSIS — K5900 Constipation, unspecified: Secondary | ICD-10-CM | POA: Diagnosis not present

## 2023-08-14 DIAGNOSIS — E782 Mixed hyperlipidemia: Secondary | ICD-10-CM | POA: Diagnosis not present

## 2023-08-15 DIAGNOSIS — I69352 Hemiplegia and hemiparesis following cerebral infarction affecting left dominant side: Secondary | ICD-10-CM | POA: Diagnosis not present

## 2023-08-15 DIAGNOSIS — I639 Cerebral infarction, unspecified: Secondary | ICD-10-CM | POA: Diagnosis not present

## 2023-08-16 DIAGNOSIS — H5452A2 Low vision left eye category 2, normal vision right eye: Secondary | ICD-10-CM | POA: Diagnosis not present

## 2023-08-16 DIAGNOSIS — M24532 Contracture, left wrist: Secondary | ICD-10-CM | POA: Diagnosis not present

## 2023-08-16 DIAGNOSIS — M75101 Unspecified rotator cuff tear or rupture of right shoulder, not specified as traumatic: Secondary | ICD-10-CM | POA: Diagnosis not present

## 2023-08-16 DIAGNOSIS — I69390 Apraxia following cerebral infarction: Secondary | ICD-10-CM | POA: Diagnosis not present

## 2023-08-16 DIAGNOSIS — M6259 Muscle wasting and atrophy, not elsewhere classified, multiple sites: Secondary | ICD-10-CM | POA: Diagnosis not present

## 2023-08-16 DIAGNOSIS — M24542 Contracture, left hand: Secondary | ICD-10-CM | POA: Diagnosis not present

## 2023-08-17 DIAGNOSIS — R2689 Other abnormalities of gait and mobility: Secondary | ICD-10-CM | POA: Diagnosis not present

## 2023-08-17 DIAGNOSIS — R278 Other lack of coordination: Secondary | ICD-10-CM | POA: Diagnosis not present

## 2023-08-17 DIAGNOSIS — M6259 Muscle wasting and atrophy, not elsewhere classified, multiple sites: Secondary | ICD-10-CM | POA: Diagnosis not present

## 2023-08-17 DIAGNOSIS — R2681 Unsteadiness on feet: Secondary | ICD-10-CM | POA: Diagnosis not present

## 2023-08-21 DIAGNOSIS — M75101 Unspecified rotator cuff tear or rupture of right shoulder, not specified as traumatic: Secondary | ICD-10-CM | POA: Diagnosis not present

## 2023-08-21 DIAGNOSIS — H5452A2 Low vision left eye category 2, normal vision right eye: Secondary | ICD-10-CM | POA: Diagnosis not present

## 2023-08-21 DIAGNOSIS — M24532 Contracture, left wrist: Secondary | ICD-10-CM | POA: Diagnosis not present

## 2023-08-21 DIAGNOSIS — M24542 Contracture, left hand: Secondary | ICD-10-CM | POA: Diagnosis not present

## 2023-08-21 DIAGNOSIS — I69390 Apraxia following cerebral infarction: Secondary | ICD-10-CM | POA: Diagnosis not present

## 2023-08-21 DIAGNOSIS — M6259 Muscle wasting and atrophy, not elsewhere classified, multiple sites: Secondary | ICD-10-CM | POA: Diagnosis not present

## 2023-08-22 DIAGNOSIS — H5452A2 Low vision left eye category 2, normal vision right eye: Secondary | ICD-10-CM | POA: Diagnosis not present

## 2023-08-22 DIAGNOSIS — M75101 Unspecified rotator cuff tear or rupture of right shoulder, not specified as traumatic: Secondary | ICD-10-CM | POA: Diagnosis not present

## 2023-08-22 DIAGNOSIS — M24542 Contracture, left hand: Secondary | ICD-10-CM | POA: Diagnosis not present

## 2023-08-22 DIAGNOSIS — I69390 Apraxia following cerebral infarction: Secondary | ICD-10-CM | POA: Diagnosis not present

## 2023-08-22 DIAGNOSIS — M24532 Contracture, left wrist: Secondary | ICD-10-CM | POA: Diagnosis not present

## 2023-08-22 DIAGNOSIS — M6259 Muscle wasting and atrophy, not elsewhere classified, multiple sites: Secondary | ICD-10-CM | POA: Diagnosis not present

## 2023-08-24 DIAGNOSIS — I69390 Apraxia following cerebral infarction: Secondary | ICD-10-CM | POA: Diagnosis not present

## 2023-08-24 DIAGNOSIS — M24532 Contracture, left wrist: Secondary | ICD-10-CM | POA: Diagnosis not present

## 2023-08-24 DIAGNOSIS — M6259 Muscle wasting and atrophy, not elsewhere classified, multiple sites: Secondary | ICD-10-CM | POA: Diagnosis not present

## 2023-08-24 DIAGNOSIS — H5452A2 Low vision left eye category 2, normal vision right eye: Secondary | ICD-10-CM | POA: Diagnosis not present

## 2023-08-24 DIAGNOSIS — M75101 Unspecified rotator cuff tear or rupture of right shoulder, not specified as traumatic: Secondary | ICD-10-CM | POA: Diagnosis not present

## 2023-08-24 DIAGNOSIS — M24542 Contracture, left hand: Secondary | ICD-10-CM | POA: Diagnosis not present

## 2023-08-24 DIAGNOSIS — G3184 Mild cognitive impairment, so stated: Secondary | ICD-10-CM | POA: Diagnosis not present

## 2023-08-27 DIAGNOSIS — M6259 Muscle wasting and atrophy, not elsewhere classified, multiple sites: Secondary | ICD-10-CM | POA: Diagnosis not present

## 2023-08-27 DIAGNOSIS — I69390 Apraxia following cerebral infarction: Secondary | ICD-10-CM | POA: Diagnosis not present

## 2023-08-27 DIAGNOSIS — H5452A2 Low vision left eye category 2, normal vision right eye: Secondary | ICD-10-CM | POA: Diagnosis not present

## 2023-08-27 DIAGNOSIS — M24542 Contracture, left hand: Secondary | ICD-10-CM | POA: Diagnosis not present

## 2023-08-27 DIAGNOSIS — M24532 Contracture, left wrist: Secondary | ICD-10-CM | POA: Diagnosis not present

## 2023-08-28 DIAGNOSIS — H5452A2 Low vision left eye category 2, normal vision right eye: Secondary | ICD-10-CM | POA: Diagnosis not present

## 2023-08-28 DIAGNOSIS — M24532 Contracture, left wrist: Secondary | ICD-10-CM | POA: Diagnosis not present

## 2023-08-28 DIAGNOSIS — R2681 Unsteadiness on feet: Secondary | ICD-10-CM | POA: Diagnosis not present

## 2023-08-28 DIAGNOSIS — I69390 Apraxia following cerebral infarction: Secondary | ICD-10-CM | POA: Diagnosis not present

## 2023-08-28 DIAGNOSIS — M75101 Unspecified rotator cuff tear or rupture of right shoulder, not specified as traumatic: Secondary | ICD-10-CM | POA: Diagnosis not present

## 2023-08-28 DIAGNOSIS — R278 Other lack of coordination: Secondary | ICD-10-CM | POA: Diagnosis not present

## 2023-08-28 DIAGNOSIS — M24542 Contracture, left hand: Secondary | ICD-10-CM | POA: Diagnosis not present

## 2023-08-28 DIAGNOSIS — M6259 Muscle wasting and atrophy, not elsewhere classified, multiple sites: Secondary | ICD-10-CM | POA: Diagnosis not present

## 2023-08-28 DIAGNOSIS — R2689 Other abnormalities of gait and mobility: Secondary | ICD-10-CM | POA: Diagnosis not present

## 2023-08-29 DIAGNOSIS — R278 Other lack of coordination: Secondary | ICD-10-CM | POA: Diagnosis not present

## 2023-08-29 DIAGNOSIS — I69390 Apraxia following cerebral infarction: Secondary | ICD-10-CM | POA: Diagnosis not present

## 2023-08-29 DIAGNOSIS — M24542 Contracture, left hand: Secondary | ICD-10-CM | POA: Diagnosis not present

## 2023-08-29 DIAGNOSIS — M75101 Unspecified rotator cuff tear or rupture of right shoulder, not specified as traumatic: Secondary | ICD-10-CM | POA: Diagnosis not present

## 2023-08-29 DIAGNOSIS — M24532 Contracture, left wrist: Secondary | ICD-10-CM | POA: Diagnosis not present

## 2023-08-29 DIAGNOSIS — M25572 Pain in left ankle and joints of left foot: Secondary | ICD-10-CM | POA: Diagnosis not present

## 2023-08-29 DIAGNOSIS — M6259 Muscle wasting and atrophy, not elsewhere classified, multiple sites: Secondary | ICD-10-CM | POA: Diagnosis not present

## 2023-08-29 DIAGNOSIS — R2689 Other abnormalities of gait and mobility: Secondary | ICD-10-CM | POA: Diagnosis not present

## 2023-08-29 DIAGNOSIS — H5452A2 Low vision left eye category 2, normal vision right eye: Secondary | ICD-10-CM | POA: Diagnosis not present

## 2023-08-29 DIAGNOSIS — R2681 Unsteadiness on feet: Secondary | ICD-10-CM | POA: Diagnosis not present

## 2023-08-30 DIAGNOSIS — R2681 Unsteadiness on feet: Secondary | ICD-10-CM | POA: Diagnosis not present

## 2023-08-30 DIAGNOSIS — R278 Other lack of coordination: Secondary | ICD-10-CM | POA: Diagnosis not present

## 2023-08-30 DIAGNOSIS — R2689 Other abnormalities of gait and mobility: Secondary | ICD-10-CM | POA: Diagnosis not present

## 2023-08-30 DIAGNOSIS — M6259 Muscle wasting and atrophy, not elsewhere classified, multiple sites: Secondary | ICD-10-CM | POA: Diagnosis not present

## 2023-09-03 DIAGNOSIS — M24542 Contracture, left hand: Secondary | ICD-10-CM | POA: Diagnosis not present

## 2023-09-03 DIAGNOSIS — M6259 Muscle wasting and atrophy, not elsewhere classified, multiple sites: Secondary | ICD-10-CM | POA: Diagnosis not present

## 2023-09-03 DIAGNOSIS — I69390 Apraxia following cerebral infarction: Secondary | ICD-10-CM | POA: Diagnosis not present

## 2023-09-03 DIAGNOSIS — H5452A2 Low vision left eye category 2, normal vision right eye: Secondary | ICD-10-CM | POA: Diagnosis not present

## 2023-09-03 DIAGNOSIS — M24532 Contracture, left wrist: Secondary | ICD-10-CM | POA: Diagnosis not present

## 2023-09-04 DIAGNOSIS — M6259 Muscle wasting and atrophy, not elsewhere classified, multiple sites: Secondary | ICD-10-CM | POA: Diagnosis not present

## 2023-09-04 DIAGNOSIS — R2689 Other abnormalities of gait and mobility: Secondary | ICD-10-CM | POA: Diagnosis not present

## 2023-09-04 DIAGNOSIS — R278 Other lack of coordination: Secondary | ICD-10-CM | POA: Diagnosis not present

## 2023-09-04 DIAGNOSIS — R2681 Unsteadiness on feet: Secondary | ICD-10-CM | POA: Diagnosis not present

## 2023-09-05 DIAGNOSIS — M75101 Unspecified rotator cuff tear or rupture of right shoulder, not specified as traumatic: Secondary | ICD-10-CM | POA: Diagnosis not present

## 2023-09-05 DIAGNOSIS — I69390 Apraxia following cerebral infarction: Secondary | ICD-10-CM | POA: Diagnosis not present

## 2023-09-05 DIAGNOSIS — M24542 Contracture, left hand: Secondary | ICD-10-CM | POA: Diagnosis not present

## 2023-09-05 DIAGNOSIS — M24532 Contracture, left wrist: Secondary | ICD-10-CM | POA: Diagnosis not present

## 2023-09-05 DIAGNOSIS — H5452A2 Low vision left eye category 2, normal vision right eye: Secondary | ICD-10-CM | POA: Diagnosis not present

## 2023-09-05 DIAGNOSIS — M6259 Muscle wasting and atrophy, not elsewhere classified, multiple sites: Secondary | ICD-10-CM | POA: Diagnosis not present

## 2023-09-06 DIAGNOSIS — M24542 Contracture, left hand: Secondary | ICD-10-CM | POA: Diagnosis not present

## 2023-09-06 DIAGNOSIS — H5452A2 Low vision left eye category 2, normal vision right eye: Secondary | ICD-10-CM | POA: Diagnosis not present

## 2023-09-06 DIAGNOSIS — M75101 Unspecified rotator cuff tear or rupture of right shoulder, not specified as traumatic: Secondary | ICD-10-CM | POA: Diagnosis not present

## 2023-09-06 DIAGNOSIS — M24532 Contracture, left wrist: Secondary | ICD-10-CM | POA: Diagnosis not present

## 2023-09-06 DIAGNOSIS — R278 Other lack of coordination: Secondary | ICD-10-CM | POA: Diagnosis not present

## 2023-09-06 DIAGNOSIS — R2681 Unsteadiness on feet: Secondary | ICD-10-CM | POA: Diagnosis not present

## 2023-09-06 DIAGNOSIS — M6259 Muscle wasting and atrophy, not elsewhere classified, multiple sites: Secondary | ICD-10-CM | POA: Diagnosis not present

## 2023-09-06 DIAGNOSIS — I69390 Apraxia following cerebral infarction: Secondary | ICD-10-CM | POA: Diagnosis not present

## 2023-09-06 DIAGNOSIS — R2689 Other abnormalities of gait and mobility: Secondary | ICD-10-CM | POA: Diagnosis not present

## 2023-09-11 DIAGNOSIS — I130 Hypertensive heart and chronic kidney disease with heart failure and stage 1 through stage 4 chronic kidney disease, or unspecified chronic kidney disease: Secondary | ICD-10-CM | POA: Diagnosis not present

## 2023-09-11 DIAGNOSIS — Z7901 Long term (current) use of anticoagulants: Secondary | ICD-10-CM | POA: Diagnosis not present

## 2023-09-11 DIAGNOSIS — N183 Chronic kidney disease, stage 3 unspecified: Secondary | ICD-10-CM | POA: Diagnosis not present

## 2023-09-11 DIAGNOSIS — R2681 Unsteadiness on feet: Secondary | ICD-10-CM | POA: Diagnosis not present

## 2023-09-11 DIAGNOSIS — M6259 Muscle wasting and atrophy, not elsewhere classified, multiple sites: Secondary | ICD-10-CM | POA: Diagnosis not present

## 2023-09-11 DIAGNOSIS — I4891 Unspecified atrial fibrillation: Secondary | ICD-10-CM | POA: Diagnosis not present

## 2023-09-11 DIAGNOSIS — R278 Other lack of coordination: Secondary | ICD-10-CM | POA: Diagnosis not present

## 2023-09-11 DIAGNOSIS — R2689 Other abnormalities of gait and mobility: Secondary | ICD-10-CM | POA: Diagnosis not present

## 2023-09-11 DIAGNOSIS — I69352 Hemiplegia and hemiparesis following cerebral infarction affecting left dominant side: Secondary | ICD-10-CM | POA: Diagnosis not present

## 2023-09-12 DIAGNOSIS — M6259 Muscle wasting and atrophy, not elsewhere classified, multiple sites: Secondary | ICD-10-CM | POA: Diagnosis not present

## 2023-09-12 DIAGNOSIS — R278 Other lack of coordination: Secondary | ICD-10-CM | POA: Diagnosis not present

## 2023-09-12 DIAGNOSIS — R2689 Other abnormalities of gait and mobility: Secondary | ICD-10-CM | POA: Diagnosis not present

## 2023-09-12 DIAGNOSIS — R2681 Unsteadiness on feet: Secondary | ICD-10-CM | POA: Diagnosis not present

## 2023-09-13 DIAGNOSIS — M6259 Muscle wasting and atrophy, not elsewhere classified, multiple sites: Secondary | ICD-10-CM | POA: Diagnosis not present

## 2023-09-13 DIAGNOSIS — R2689 Other abnormalities of gait and mobility: Secondary | ICD-10-CM | POA: Diagnosis not present

## 2023-09-13 DIAGNOSIS — M24532 Contracture, left wrist: Secondary | ICD-10-CM | POA: Diagnosis not present

## 2023-09-13 DIAGNOSIS — R2681 Unsteadiness on feet: Secondary | ICD-10-CM | POA: Diagnosis not present

## 2023-09-13 DIAGNOSIS — I69390 Apraxia following cerebral infarction: Secondary | ICD-10-CM | POA: Diagnosis not present

## 2023-09-13 DIAGNOSIS — M24542 Contracture, left hand: Secondary | ICD-10-CM | POA: Diagnosis not present

## 2023-09-13 DIAGNOSIS — M75101 Unspecified rotator cuff tear or rupture of right shoulder, not specified as traumatic: Secondary | ICD-10-CM | POA: Diagnosis not present

## 2023-09-13 DIAGNOSIS — R278 Other lack of coordination: Secondary | ICD-10-CM | POA: Diagnosis not present

## 2023-09-13 DIAGNOSIS — H5452A2 Low vision left eye category 2, normal vision right eye: Secondary | ICD-10-CM | POA: Diagnosis not present

## 2023-09-17 DIAGNOSIS — R278 Other lack of coordination: Secondary | ICD-10-CM | POA: Diagnosis not present

## 2023-09-17 DIAGNOSIS — R2681 Unsteadiness on feet: Secondary | ICD-10-CM | POA: Diagnosis not present

## 2023-09-17 DIAGNOSIS — I639 Cerebral infarction, unspecified: Secondary | ICD-10-CM | POA: Diagnosis not present

## 2023-09-17 DIAGNOSIS — R2689 Other abnormalities of gait and mobility: Secondary | ICD-10-CM | POA: Diagnosis not present

## 2023-09-17 DIAGNOSIS — N183 Chronic kidney disease, stage 3 unspecified: Secondary | ICD-10-CM | POA: Diagnosis not present

## 2023-09-17 DIAGNOSIS — M6259 Muscle wasting and atrophy, not elsewhere classified, multiple sites: Secondary | ICD-10-CM | POA: Diagnosis not present

## 2023-09-18 DIAGNOSIS — R278 Other lack of coordination: Secondary | ICD-10-CM | POA: Diagnosis not present

## 2023-09-18 DIAGNOSIS — R2689 Other abnormalities of gait and mobility: Secondary | ICD-10-CM | POA: Diagnosis not present

## 2023-09-18 DIAGNOSIS — R2681 Unsteadiness on feet: Secondary | ICD-10-CM | POA: Diagnosis not present

## 2023-09-18 DIAGNOSIS — M6259 Muscle wasting and atrophy, not elsewhere classified, multiple sites: Secondary | ICD-10-CM | POA: Diagnosis not present

## 2023-09-24 DIAGNOSIS — R2689 Other abnormalities of gait and mobility: Secondary | ICD-10-CM | POA: Diagnosis not present

## 2023-09-24 DIAGNOSIS — M75101 Unspecified rotator cuff tear or rupture of right shoulder, not specified as traumatic: Secondary | ICD-10-CM | POA: Diagnosis not present

## 2023-09-24 DIAGNOSIS — M6259 Muscle wasting and atrophy, not elsewhere classified, multiple sites: Secondary | ICD-10-CM | POA: Diagnosis not present

## 2023-09-24 DIAGNOSIS — M24532 Contracture, left wrist: Secondary | ICD-10-CM | POA: Diagnosis not present

## 2023-09-24 DIAGNOSIS — H5452A2 Low vision left eye category 2, normal vision right eye: Secondary | ICD-10-CM | POA: Diagnosis not present

## 2023-09-24 DIAGNOSIS — I69390 Apraxia following cerebral infarction: Secondary | ICD-10-CM | POA: Diagnosis not present

## 2023-09-24 DIAGNOSIS — R2681 Unsteadiness on feet: Secondary | ICD-10-CM | POA: Diagnosis not present

## 2023-09-24 DIAGNOSIS — M24542 Contracture, left hand: Secondary | ICD-10-CM | POA: Diagnosis not present

## 2023-09-24 DIAGNOSIS — R278 Other lack of coordination: Secondary | ICD-10-CM | POA: Diagnosis not present

## 2023-09-30 DIAGNOSIS — M6259 Muscle wasting and atrophy, not elsewhere classified, multiple sites: Secondary | ICD-10-CM | POA: Diagnosis not present

## 2023-09-30 DIAGNOSIS — R2689 Other abnormalities of gait and mobility: Secondary | ICD-10-CM | POA: Diagnosis not present

## 2023-09-30 DIAGNOSIS — R278 Other lack of coordination: Secondary | ICD-10-CM | POA: Diagnosis not present

## 2023-09-30 DIAGNOSIS — R2681 Unsteadiness on feet: Secondary | ICD-10-CM | POA: Diagnosis not present

## 2023-10-01 DIAGNOSIS — M6259 Muscle wasting and atrophy, not elsewhere classified, multiple sites: Secondary | ICD-10-CM | POA: Diagnosis not present

## 2023-10-01 DIAGNOSIS — R2689 Other abnormalities of gait and mobility: Secondary | ICD-10-CM | POA: Diagnosis not present

## 2023-10-01 DIAGNOSIS — R2681 Unsteadiness on feet: Secondary | ICD-10-CM | POA: Diagnosis not present

## 2023-10-01 DIAGNOSIS — R278 Other lack of coordination: Secondary | ICD-10-CM | POA: Diagnosis not present

## 2023-10-02 DIAGNOSIS — R278 Other lack of coordination: Secondary | ICD-10-CM | POA: Diagnosis not present

## 2023-10-02 DIAGNOSIS — M62542 Muscle wasting and atrophy, not elsewhere classified, left hand: Secondary | ICD-10-CM | POA: Diagnosis not present

## 2023-10-02 DIAGNOSIS — M62512 Muscle wasting and atrophy, not elsewhere classified, left shoulder: Secondary | ICD-10-CM | POA: Diagnosis not present

## 2023-10-03 DIAGNOSIS — R278 Other lack of coordination: Secondary | ICD-10-CM | POA: Diagnosis not present

## 2023-10-03 DIAGNOSIS — M62542 Muscle wasting and atrophy, not elsewhere classified, left hand: Secondary | ICD-10-CM | POA: Diagnosis not present

## 2023-10-03 DIAGNOSIS — M62512 Muscle wasting and atrophy, not elsewhere classified, left shoulder: Secondary | ICD-10-CM | POA: Diagnosis not present

## 2023-10-05 DIAGNOSIS — G4700 Insomnia, unspecified: Secondary | ICD-10-CM | POA: Diagnosis not present

## 2023-10-05 DIAGNOSIS — G3184 Mild cognitive impairment, so stated: Secondary | ICD-10-CM | POA: Diagnosis not present

## 2023-10-08 DIAGNOSIS — I10 Essential (primary) hypertension: Secondary | ICD-10-CM | POA: Diagnosis not present

## 2023-10-08 DIAGNOSIS — N183 Chronic kidney disease, stage 3 unspecified: Secondary | ICD-10-CM | POA: Diagnosis not present

## 2023-10-09 DIAGNOSIS — M62542 Muscle wasting and atrophy, not elsewhere classified, left hand: Secondary | ICD-10-CM | POA: Diagnosis not present

## 2023-10-09 DIAGNOSIS — I69352 Hemiplegia and hemiparesis following cerebral infarction affecting left dominant side: Secondary | ICD-10-CM | POA: Diagnosis not present

## 2023-10-09 DIAGNOSIS — K5900 Constipation, unspecified: Secondary | ICD-10-CM | POA: Diagnosis not present

## 2023-10-09 DIAGNOSIS — M62552 Muscle wasting and atrophy, not elsewhere classified, left thigh: Secondary | ICD-10-CM | POA: Diagnosis not present

## 2023-10-09 DIAGNOSIS — Z7901 Long term (current) use of anticoagulants: Secondary | ICD-10-CM | POA: Diagnosis not present

## 2023-10-09 DIAGNOSIS — G4700 Insomnia, unspecified: Secondary | ICD-10-CM | POA: Diagnosis not present

## 2023-10-09 DIAGNOSIS — E538 Deficiency of other specified B group vitamins: Secondary | ICD-10-CM | POA: Diagnosis not present

## 2023-10-09 DIAGNOSIS — M62512 Muscle wasting and atrophy, not elsewhere classified, left shoulder: Secondary | ICD-10-CM | POA: Diagnosis not present

## 2023-10-09 DIAGNOSIS — R2681 Unsteadiness on feet: Secondary | ICD-10-CM | POA: Diagnosis not present

## 2023-10-09 DIAGNOSIS — R278 Other lack of coordination: Secondary | ICD-10-CM | POA: Diagnosis not present

## 2023-10-10 DIAGNOSIS — R278 Other lack of coordination: Secondary | ICD-10-CM | POA: Diagnosis not present

## 2023-10-10 DIAGNOSIS — M62512 Muscle wasting and atrophy, not elsewhere classified, left shoulder: Secondary | ICD-10-CM | POA: Diagnosis not present

## 2023-10-10 DIAGNOSIS — M62542 Muscle wasting and atrophy, not elsewhere classified, left hand: Secondary | ICD-10-CM | POA: Diagnosis not present

## 2023-10-15 DIAGNOSIS — M62542 Muscle wasting and atrophy, not elsewhere classified, left hand: Secondary | ICD-10-CM | POA: Diagnosis not present

## 2023-10-15 DIAGNOSIS — R278 Other lack of coordination: Secondary | ICD-10-CM | POA: Diagnosis not present

## 2023-10-15 DIAGNOSIS — M62512 Muscle wasting and atrophy, not elsewhere classified, left shoulder: Secondary | ICD-10-CM | POA: Diagnosis not present

## 2023-10-16 DIAGNOSIS — M62512 Muscle wasting and atrophy, not elsewhere classified, left shoulder: Secondary | ICD-10-CM | POA: Diagnosis not present

## 2023-10-16 DIAGNOSIS — M62542 Muscle wasting and atrophy, not elsewhere classified, left hand: Secondary | ICD-10-CM | POA: Diagnosis not present

## 2023-10-16 DIAGNOSIS — R278 Other lack of coordination: Secondary | ICD-10-CM | POA: Diagnosis not present

## 2023-10-17 DIAGNOSIS — M62512 Muscle wasting and atrophy, not elsewhere classified, left shoulder: Secondary | ICD-10-CM | POA: Diagnosis not present

## 2023-10-17 DIAGNOSIS — R278 Other lack of coordination: Secondary | ICD-10-CM | POA: Diagnosis not present

## 2023-10-17 DIAGNOSIS — M62542 Muscle wasting and atrophy, not elsewhere classified, left hand: Secondary | ICD-10-CM | POA: Diagnosis not present

## 2023-10-19 DIAGNOSIS — M62552 Muscle wasting and atrophy, not elsewhere classified, left thigh: Secondary | ICD-10-CM | POA: Diagnosis not present

## 2023-10-19 DIAGNOSIS — R278 Other lack of coordination: Secondary | ICD-10-CM | POA: Diagnosis not present

## 2023-10-19 DIAGNOSIS — R2681 Unsteadiness on feet: Secondary | ICD-10-CM | POA: Diagnosis not present

## 2023-10-22 DIAGNOSIS — M62512 Muscle wasting and atrophy, not elsewhere classified, left shoulder: Secondary | ICD-10-CM | POA: Diagnosis not present

## 2023-10-22 DIAGNOSIS — M62542 Muscle wasting and atrophy, not elsewhere classified, left hand: Secondary | ICD-10-CM | POA: Diagnosis not present

## 2023-10-23 DIAGNOSIS — R278 Other lack of coordination: Secondary | ICD-10-CM | POA: Diagnosis not present

## 2023-10-23 DIAGNOSIS — M62542 Muscle wasting and atrophy, not elsewhere classified, left hand: Secondary | ICD-10-CM | POA: Diagnosis not present

## 2023-10-23 DIAGNOSIS — R2681 Unsteadiness on feet: Secondary | ICD-10-CM | POA: Diagnosis not present

## 2023-10-23 DIAGNOSIS — M62552 Muscle wasting and atrophy, not elsewhere classified, left thigh: Secondary | ICD-10-CM | POA: Diagnosis not present

## 2023-10-23 DIAGNOSIS — M62512 Muscle wasting and atrophy, not elsewhere classified, left shoulder: Secondary | ICD-10-CM | POA: Diagnosis not present

## 2023-10-26 DIAGNOSIS — R2681 Unsteadiness on feet: Secondary | ICD-10-CM | POA: Diagnosis not present

## 2023-10-26 DIAGNOSIS — R278 Other lack of coordination: Secondary | ICD-10-CM | POA: Diagnosis not present

## 2023-10-26 DIAGNOSIS — M62552 Muscle wasting and atrophy, not elsewhere classified, left thigh: Secondary | ICD-10-CM | POA: Diagnosis not present

## 2023-10-27 DIAGNOSIS — M62552 Muscle wasting and atrophy, not elsewhere classified, left thigh: Secondary | ICD-10-CM | POA: Diagnosis not present

## 2023-10-27 DIAGNOSIS — R278 Other lack of coordination: Secondary | ICD-10-CM | POA: Diagnosis not present

## 2023-10-27 DIAGNOSIS — R2681 Unsteadiness on feet: Secondary | ICD-10-CM | POA: Diagnosis not present

## 2023-10-29 DIAGNOSIS — M62552 Muscle wasting and atrophy, not elsewhere classified, left thigh: Secondary | ICD-10-CM | POA: Diagnosis not present

## 2023-10-29 DIAGNOSIS — R278 Other lack of coordination: Secondary | ICD-10-CM | POA: Diagnosis not present

## 2023-10-29 DIAGNOSIS — R2681 Unsteadiness on feet: Secondary | ICD-10-CM | POA: Diagnosis not present

## 2023-10-30 DIAGNOSIS — M62512 Muscle wasting and atrophy, not elsewhere classified, left shoulder: Secondary | ICD-10-CM | POA: Diagnosis not present

## 2023-10-30 DIAGNOSIS — R278 Other lack of coordination: Secondary | ICD-10-CM | POA: Diagnosis not present

## 2023-10-30 DIAGNOSIS — M62542 Muscle wasting and atrophy, not elsewhere classified, left hand: Secondary | ICD-10-CM | POA: Diagnosis not present

## 2023-10-31 DIAGNOSIS — R278 Other lack of coordination: Secondary | ICD-10-CM | POA: Diagnosis not present

## 2023-10-31 DIAGNOSIS — M62512 Muscle wasting and atrophy, not elsewhere classified, left shoulder: Secondary | ICD-10-CM | POA: Diagnosis not present

## 2023-10-31 DIAGNOSIS — M62542 Muscle wasting and atrophy, not elsewhere classified, left hand: Secondary | ICD-10-CM | POA: Diagnosis not present

## 2023-11-02 DIAGNOSIS — G3184 Mild cognitive impairment, so stated: Secondary | ICD-10-CM | POA: Diagnosis not present

## 2023-11-02 DIAGNOSIS — M62542 Muscle wasting and atrophy, not elsewhere classified, left hand: Secondary | ICD-10-CM | POA: Diagnosis not present

## 2023-11-02 DIAGNOSIS — R278 Other lack of coordination: Secondary | ICD-10-CM | POA: Diagnosis not present

## 2023-11-02 DIAGNOSIS — M62512 Muscle wasting and atrophy, not elsewhere classified, left shoulder: Secondary | ICD-10-CM | POA: Diagnosis not present

## 2023-11-06 DIAGNOSIS — I1310 Hypertensive heart and chronic kidney disease without heart failure, with stage 1 through stage 4 chronic kidney disease, or unspecified chronic kidney disease: Secondary | ICD-10-CM | POA: Diagnosis not present

## 2023-11-06 DIAGNOSIS — N183 Chronic kidney disease, stage 3 unspecified: Secondary | ICD-10-CM | POA: Diagnosis not present

## 2023-11-06 DIAGNOSIS — I4891 Unspecified atrial fibrillation: Secondary | ICD-10-CM | POA: Diagnosis not present

## 2023-11-06 DIAGNOSIS — M62552 Muscle wasting and atrophy, not elsewhere classified, left thigh: Secondary | ICD-10-CM | POA: Diagnosis not present

## 2023-11-06 DIAGNOSIS — M62512 Muscle wasting and atrophy, not elsewhere classified, left shoulder: Secondary | ICD-10-CM | POA: Diagnosis not present

## 2023-11-06 DIAGNOSIS — E782 Mixed hyperlipidemia: Secondary | ICD-10-CM | POA: Diagnosis not present

## 2023-11-06 DIAGNOSIS — R2681 Unsteadiness on feet: Secondary | ICD-10-CM | POA: Diagnosis not present

## 2023-11-06 DIAGNOSIS — R278 Other lack of coordination: Secondary | ICD-10-CM | POA: Diagnosis not present

## 2023-11-06 DIAGNOSIS — M62542 Muscle wasting and atrophy, not elsewhere classified, left hand: Secondary | ICD-10-CM | POA: Diagnosis not present

## 2023-11-07 DIAGNOSIS — M62512 Muscle wasting and atrophy, not elsewhere classified, left shoulder: Secondary | ICD-10-CM | POA: Diagnosis not present

## 2023-11-08 DIAGNOSIS — M62552 Muscle wasting and atrophy, not elsewhere classified, left thigh: Secondary | ICD-10-CM | POA: Diagnosis not present

## 2023-11-08 DIAGNOSIS — R278 Other lack of coordination: Secondary | ICD-10-CM | POA: Diagnosis not present

## 2023-11-08 DIAGNOSIS — R2681 Unsteadiness on feet: Secondary | ICD-10-CM | POA: Diagnosis not present

## 2023-11-08 DIAGNOSIS — M62542 Muscle wasting and atrophy, not elsewhere classified, left hand: Secondary | ICD-10-CM | POA: Diagnosis not present

## 2023-11-08 DIAGNOSIS — M62512 Muscle wasting and atrophy, not elsewhere classified, left shoulder: Secondary | ICD-10-CM | POA: Diagnosis not present

## 2023-11-09 DIAGNOSIS — R278 Other lack of coordination: Secondary | ICD-10-CM | POA: Diagnosis not present

## 2023-11-09 DIAGNOSIS — M62552 Muscle wasting and atrophy, not elsewhere classified, left thigh: Secondary | ICD-10-CM | POA: Diagnosis not present

## 2023-11-09 DIAGNOSIS — R2681 Unsteadiness on feet: Secondary | ICD-10-CM | POA: Diagnosis not present

## 2023-11-11 DIAGNOSIS — M62552 Muscle wasting and atrophy, not elsewhere classified, left thigh: Secondary | ICD-10-CM | POA: Diagnosis not present

## 2023-11-11 DIAGNOSIS — R2681 Unsteadiness on feet: Secondary | ICD-10-CM | POA: Diagnosis not present

## 2023-11-11 DIAGNOSIS — R278 Other lack of coordination: Secondary | ICD-10-CM | POA: Diagnosis not present

## 2023-11-12 DIAGNOSIS — R278 Other lack of coordination: Secondary | ICD-10-CM | POA: Diagnosis not present

## 2023-11-12 DIAGNOSIS — M62552 Muscle wasting and atrophy, not elsewhere classified, left thigh: Secondary | ICD-10-CM | POA: Diagnosis not present

## 2023-11-12 DIAGNOSIS — R2681 Unsteadiness on feet: Secondary | ICD-10-CM | POA: Diagnosis not present

## 2023-11-14 DIAGNOSIS — M62542 Muscle wasting and atrophy, not elsewhere classified, left hand: Secondary | ICD-10-CM | POA: Diagnosis not present

## 2023-11-14 DIAGNOSIS — R278 Other lack of coordination: Secondary | ICD-10-CM | POA: Diagnosis not present

## 2023-11-14 DIAGNOSIS — M62512 Muscle wasting and atrophy, not elsewhere classified, left shoulder: Secondary | ICD-10-CM | POA: Diagnosis not present

## 2023-11-14 DIAGNOSIS — M62552 Muscle wasting and atrophy, not elsewhere classified, left thigh: Secondary | ICD-10-CM | POA: Diagnosis not present

## 2023-11-15 DIAGNOSIS — I1310 Hypertensive heart and chronic kidney disease without heart failure, with stage 1 through stage 4 chronic kidney disease, or unspecified chronic kidney disease: Secondary | ICD-10-CM | POA: Diagnosis not present

## 2023-11-16 DIAGNOSIS — I10 Essential (primary) hypertension: Secondary | ICD-10-CM | POA: Diagnosis not present

## 2023-11-16 DIAGNOSIS — E785 Hyperlipidemia, unspecified: Secondary | ICD-10-CM | POA: Diagnosis not present

## 2023-11-19 DIAGNOSIS — M62542 Muscle wasting and atrophy, not elsewhere classified, left hand: Secondary | ICD-10-CM | POA: Diagnosis not present

## 2023-11-19 DIAGNOSIS — M62512 Muscle wasting and atrophy, not elsewhere classified, left shoulder: Secondary | ICD-10-CM | POA: Diagnosis not present

## 2023-11-19 DIAGNOSIS — R278 Other lack of coordination: Secondary | ICD-10-CM | POA: Diagnosis not present

## 2023-11-20 DIAGNOSIS — M62512 Muscle wasting and atrophy, not elsewhere classified, left shoulder: Secondary | ICD-10-CM | POA: Diagnosis not present

## 2023-11-20 DIAGNOSIS — R278 Other lack of coordination: Secondary | ICD-10-CM | POA: Diagnosis not present

## 2023-11-20 DIAGNOSIS — M62542 Muscle wasting and atrophy, not elsewhere classified, left hand: Secondary | ICD-10-CM | POA: Diagnosis not present

## 2023-11-21 DIAGNOSIS — I69391 Dysphagia following cerebral infarction: Secondary | ICD-10-CM | POA: Diagnosis not present

## 2023-11-21 DIAGNOSIS — R1312 Dysphagia, oropharyngeal phase: Secondary | ICD-10-CM | POA: Diagnosis not present

## 2023-11-23 DIAGNOSIS — M62552 Muscle wasting and atrophy, not elsewhere classified, left thigh: Secondary | ICD-10-CM | POA: Diagnosis not present

## 2023-11-23 DIAGNOSIS — R278 Other lack of coordination: Secondary | ICD-10-CM | POA: Diagnosis not present

## 2023-11-23 DIAGNOSIS — R2681 Unsteadiness on feet: Secondary | ICD-10-CM | POA: Diagnosis not present

## 2023-11-26 DIAGNOSIS — M62552 Muscle wasting and atrophy, not elsewhere classified, left thigh: Secondary | ICD-10-CM | POA: Diagnosis not present

## 2023-11-26 DIAGNOSIS — R2681 Unsteadiness on feet: Secondary | ICD-10-CM | POA: Diagnosis not present

## 2023-11-28 DIAGNOSIS — R278 Other lack of coordination: Secondary | ICD-10-CM | POA: Diagnosis not present

## 2023-11-28 DIAGNOSIS — M62512 Muscle wasting and atrophy, not elsewhere classified, left shoulder: Secondary | ICD-10-CM | POA: Diagnosis not present

## 2023-11-28 DIAGNOSIS — M62542 Muscle wasting and atrophy, not elsewhere classified, left hand: Secondary | ICD-10-CM | POA: Diagnosis not present

## 2023-11-30 DIAGNOSIS — G3184 Mild cognitive impairment, so stated: Secondary | ICD-10-CM | POA: Diagnosis not present

## 2023-11-30 DIAGNOSIS — R278 Other lack of coordination: Secondary | ICD-10-CM | POA: Diagnosis not present

## 2023-11-30 DIAGNOSIS — G4700 Insomnia, unspecified: Secondary | ICD-10-CM | POA: Diagnosis not present

## 2023-11-30 DIAGNOSIS — M62552 Muscle wasting and atrophy, not elsewhere classified, left thigh: Secondary | ICD-10-CM | POA: Diagnosis not present

## 2023-11-30 DIAGNOSIS — R2681 Unsteadiness on feet: Secondary | ICD-10-CM | POA: Diagnosis not present

## 2023-11-30 DIAGNOSIS — L299 Pruritus, unspecified: Secondary | ICD-10-CM | POA: Diagnosis not present

## 2023-12-04 DIAGNOSIS — R1312 Dysphagia, oropharyngeal phase: Secondary | ICD-10-CM | POA: Diagnosis not present

## 2023-12-04 DIAGNOSIS — I69391 Dysphagia following cerebral infarction: Secondary | ICD-10-CM | POA: Diagnosis not present

## 2023-12-11 DIAGNOSIS — Z7901 Long term (current) use of anticoagulants: Secondary | ICD-10-CM | POA: Diagnosis not present

## 2023-12-11 DIAGNOSIS — I69352 Hemiplegia and hemiparesis following cerebral infarction affecting left dominant side: Secondary | ICD-10-CM | POA: Diagnosis not present

## 2023-12-11 DIAGNOSIS — G3184 Mild cognitive impairment, so stated: Secondary | ICD-10-CM | POA: Diagnosis not present

## 2023-12-11 DIAGNOSIS — L853 Xerosis cutis: Secondary | ICD-10-CM | POA: Diagnosis not present

## 2023-12-11 DIAGNOSIS — G4700 Insomnia, unspecified: Secondary | ICD-10-CM | POA: Diagnosis not present

## 2023-12-11 DIAGNOSIS — I6930 Unspecified sequelae of cerebral infarction: Secondary | ICD-10-CM | POA: Diagnosis not present

## 2023-12-13 DIAGNOSIS — E785 Hyperlipidemia, unspecified: Secondary | ICD-10-CM | POA: Diagnosis not present

## 2023-12-13 DIAGNOSIS — N183 Chronic kidney disease, stage 3 unspecified: Secondary | ICD-10-CM | POA: Diagnosis not present

## 2023-12-22 ENCOUNTER — Inpatient Hospital Stay (HOSPITAL_COMMUNITY)
Admission: EM | Admit: 2023-12-22 | Discharge: 2023-12-25 | DRG: 064 | Disposition: A | Discharge status: Nursing Home (Includes ICF) | Source: Skilled Nursing Facility | Attending: Internal Medicine | Admitting: Internal Medicine

## 2023-12-22 ENCOUNTER — Emergency Department (HOSPITAL_COMMUNITY)

## 2023-12-22 ENCOUNTER — Observation Stay (HOSPITAL_COMMUNITY)

## 2023-12-22 ENCOUNTER — Encounter (HOSPITAL_COMMUNITY): Payer: Self-pay

## 2023-12-22 ENCOUNTER — Other Ambulatory Visit: Payer: Self-pay

## 2023-12-22 DIAGNOSIS — F419 Anxiety disorder, unspecified: Secondary | ICD-10-CM | POA: Diagnosis present

## 2023-12-22 DIAGNOSIS — I63541 Cerebral infarction due to unspecified occlusion or stenosis of right cerebellar artery: Principal | ICD-10-CM | POA: Diagnosis present

## 2023-12-22 DIAGNOSIS — I480 Paroxysmal atrial fibrillation: Secondary | ICD-10-CM | POA: Diagnosis not present

## 2023-12-22 DIAGNOSIS — I350 Nonrheumatic aortic (valve) stenosis: Secondary | ICD-10-CM | POA: Diagnosis present

## 2023-12-22 DIAGNOSIS — R29710 NIHSS score 10: Secondary | ICD-10-CM | POA: Diagnosis not present

## 2023-12-22 DIAGNOSIS — R7303 Prediabetes: Secondary | ICD-10-CM | POA: Diagnosis not present

## 2023-12-22 DIAGNOSIS — Z66 Do not resuscitate: Secondary | ICD-10-CM | POA: Diagnosis not present

## 2023-12-22 DIAGNOSIS — J209 Acute bronchitis, unspecified: Secondary | ICD-10-CM | POA: Diagnosis not present

## 2023-12-22 DIAGNOSIS — I517 Cardiomegaly: Secondary | ICD-10-CM | POA: Diagnosis not present

## 2023-12-22 DIAGNOSIS — J929 Pleural plaque without asbestos: Secondary | ICD-10-CM | POA: Diagnosis not present

## 2023-12-22 DIAGNOSIS — I7090 Unspecified atherosclerosis: Secondary | ICD-10-CM

## 2023-12-22 DIAGNOSIS — Z87891 Personal history of nicotine dependence: Secondary | ICD-10-CM | POA: Diagnosis not present

## 2023-12-22 DIAGNOSIS — I129 Hypertensive chronic kidney disease with stage 1 through stage 4 chronic kidney disease, or unspecified chronic kidney disease: Secondary | ICD-10-CM | POA: Diagnosis not present

## 2023-12-22 DIAGNOSIS — E7800 Pure hypercholesterolemia, unspecified: Secondary | ICD-10-CM | POA: Diagnosis not present

## 2023-12-22 DIAGNOSIS — I6523 Occlusion and stenosis of bilateral carotid arteries: Secondary | ICD-10-CM | POA: Diagnosis not present

## 2023-12-22 DIAGNOSIS — R29818 Other symptoms and signs involving the nervous system: Secondary | ICD-10-CM | POA: Diagnosis not present

## 2023-12-22 DIAGNOSIS — N1831 Chronic kidney disease, stage 3a: Secondary | ICD-10-CM | POA: Diagnosis present

## 2023-12-22 DIAGNOSIS — I482 Chronic atrial fibrillation, unspecified: Secondary | ICD-10-CM | POA: Diagnosis not present

## 2023-12-22 DIAGNOSIS — K118 Other diseases of salivary glands: Secondary | ICD-10-CM | POA: Diagnosis present

## 2023-12-22 DIAGNOSIS — Z79899 Other long term (current) drug therapy: Secondary | ICD-10-CM

## 2023-12-22 DIAGNOSIS — R9082 White matter disease, unspecified: Secondary | ICD-10-CM | POA: Diagnosis present

## 2023-12-22 DIAGNOSIS — R2990 Unspecified symptoms and signs involving the nervous system: Principal | ICD-10-CM

## 2023-12-22 DIAGNOSIS — R270 Ataxia, unspecified: Secondary | ICD-10-CM | POA: Diagnosis present

## 2023-12-22 DIAGNOSIS — I63512 Cerebral infarction due to unspecified occlusion or stenosis of left middle cerebral artery: Secondary | ICD-10-CM

## 2023-12-22 DIAGNOSIS — I6782 Cerebral ischemia: Secondary | ICD-10-CM | POA: Diagnosis not present

## 2023-12-22 DIAGNOSIS — Z7901 Long term (current) use of anticoagulants: Secondary | ICD-10-CM | POA: Diagnosis not present

## 2023-12-22 DIAGNOSIS — E782 Mixed hyperlipidemia: Secondary | ICD-10-CM

## 2023-12-22 DIAGNOSIS — I77810 Thoracic aortic ectasia: Secondary | ICD-10-CM | POA: Diagnosis present

## 2023-12-22 DIAGNOSIS — E785 Hyperlipidemia, unspecified: Secondary | ICD-10-CM | POA: Diagnosis present

## 2023-12-22 DIAGNOSIS — I69354 Hemiplegia and hemiparesis following cerebral infarction affecting left non-dominant side: Secondary | ICD-10-CM

## 2023-12-22 DIAGNOSIS — N189 Chronic kidney disease, unspecified: Secondary | ICD-10-CM | POA: Diagnosis present

## 2023-12-22 DIAGNOSIS — R4781 Slurred speech: Secondary | ICD-10-CM | POA: Diagnosis not present

## 2023-12-22 DIAGNOSIS — I639 Cerebral infarction, unspecified: Secondary | ICD-10-CM | POA: Diagnosis present

## 2023-12-22 DIAGNOSIS — R1310 Dysphagia, unspecified: Secondary | ICD-10-CM | POA: Diagnosis not present

## 2023-12-22 DIAGNOSIS — I6501 Occlusion and stenosis of right vertebral artery: Secondary | ICD-10-CM | POA: Diagnosis not present

## 2023-12-22 DIAGNOSIS — I6381 Other cerebral infarction due to occlusion or stenosis of small artery: Secondary | ICD-10-CM | POA: Diagnosis present

## 2023-12-22 DIAGNOSIS — F32A Depression, unspecified: Secondary | ICD-10-CM | POA: Diagnosis present

## 2023-12-22 DIAGNOSIS — R471 Dysarthria and anarthria: Secondary | ICD-10-CM | POA: Diagnosis present

## 2023-12-22 DIAGNOSIS — R4702 Dysphasia: Secondary | ICD-10-CM | POA: Diagnosis present

## 2023-12-22 DIAGNOSIS — I63511 Cerebral infarction due to unspecified occlusion or stenosis of right middle cerebral artery: Secondary | ICD-10-CM | POA: Diagnosis not present

## 2023-12-22 DIAGNOSIS — R29810 Facial weakness: Secondary | ICD-10-CM | POA: Diagnosis not present

## 2023-12-22 DIAGNOSIS — F5105 Insomnia due to other mental disorder: Secondary | ICD-10-CM | POA: Diagnosis present

## 2023-12-22 DIAGNOSIS — R9389 Abnormal findings on diagnostic imaging of other specified body structures: Secondary | ICD-10-CM | POA: Diagnosis not present

## 2023-12-22 DIAGNOSIS — Z8249 Family history of ischemic heart disease and other diseases of the circulatory system: Secondary | ICD-10-CM

## 2023-12-22 DIAGNOSIS — Z1152 Encounter for screening for COVID-19: Secondary | ICD-10-CM

## 2023-12-22 DIAGNOSIS — Z823 Family history of stroke: Secondary | ICD-10-CM

## 2023-12-22 DIAGNOSIS — J189 Pneumonia, unspecified organism: Secondary | ICD-10-CM | POA: Diagnosis present

## 2023-12-22 DIAGNOSIS — I6930 Unspecified sequelae of cerebral infarction: Secondary | ICD-10-CM

## 2023-12-22 DIAGNOSIS — J984 Other disorders of lung: Secondary | ICD-10-CM | POA: Diagnosis present

## 2023-12-22 DIAGNOSIS — R0602 Shortness of breath: Secondary | ICD-10-CM | POA: Diagnosis not present

## 2023-12-22 DIAGNOSIS — I672 Cerebral atherosclerosis: Secondary | ICD-10-CM | POA: Diagnosis not present

## 2023-12-22 DIAGNOSIS — I2510 Atherosclerotic heart disease of native coronary artery without angina pectoris: Secondary | ICD-10-CM | POA: Diagnosis not present

## 2023-12-22 DIAGNOSIS — Z743 Need for continuous supervision: Secondary | ICD-10-CM | POA: Diagnosis not present

## 2023-12-22 DIAGNOSIS — I499 Cardiac arrhythmia, unspecified: Secondary | ICD-10-CM | POA: Diagnosis not present

## 2023-12-22 DIAGNOSIS — J439 Emphysema, unspecified: Secondary | ICD-10-CM | POA: Diagnosis not present

## 2023-12-22 DIAGNOSIS — R918 Other nonspecific abnormal finding of lung field: Secondary | ICD-10-CM | POA: Diagnosis not present

## 2023-12-22 DIAGNOSIS — Z860100 Personal history of colon polyps, unspecified: Secondary | ICD-10-CM

## 2023-12-22 LAB — RESP PANEL BY RT-PCR (RSV, FLU A&B, COVID)  RVPGX2
Influenza A by PCR: NEGATIVE
Influenza B by PCR: NEGATIVE
Resp Syncytial Virus by PCR: NEGATIVE
SARS Coronavirus 2 by RT PCR: NEGATIVE

## 2023-12-22 LAB — CBC
HCT: 43.6 % (ref 39.0–52.0)
Hemoglobin: 14.8 g/dL (ref 13.0–17.0)
MCH: 32 pg (ref 26.0–34.0)
MCHC: 33.9 g/dL (ref 30.0–36.0)
MCV: 94.2 fL (ref 80.0–100.0)
Platelets: 176 K/uL (ref 150–400)
RBC: 4.63 MIL/uL (ref 4.22–5.81)
RDW: 13 % (ref 11.5–15.5)
WBC: 7.4 K/uL (ref 4.0–10.5)
nRBC: 0 % (ref 0.0–0.2)

## 2023-12-22 LAB — I-STAT CHEM 8, ED
BUN: 21 mg/dL (ref 8–23)
Calcium, Ion: 1.07 mmol/L — ABNORMAL LOW (ref 1.15–1.40)
Chloride: 104 mmol/L (ref 98–111)
Creatinine, Ser: 1.3 mg/dL — ABNORMAL HIGH (ref 0.61–1.24)
Glucose, Bld: 197 mg/dL — ABNORMAL HIGH (ref 70–99)
HCT: 44 % (ref 39.0–52.0)
Hemoglobin: 15 g/dL (ref 13.0–17.0)
Potassium: 3.6 mmol/L (ref 3.5–5.1)
Sodium: 139 mmol/L (ref 135–145)
TCO2: 23 mmol/L (ref 22–32)

## 2023-12-22 LAB — I-STAT VENOUS BLOOD GAS, ED
Acid-Base Excess: 0 mmol/L (ref 0.0–2.0)
Bicarbonate: 24.8 mmol/L (ref 20.0–28.0)
Calcium, Ion: 1.15 mmol/L (ref 1.15–1.40)
HCT: 39 % (ref 39.0–52.0)
Hemoglobin: 13.3 g/dL (ref 13.0–17.0)
O2 Saturation: 83 %
Potassium: 3.4 mmol/L — ABNORMAL LOW (ref 3.5–5.1)
Sodium: 139 mmol/L (ref 135–145)
TCO2: 26 mmol/L (ref 22–32)
pCO2, Ven: 40 mmHg — ABNORMAL LOW (ref 44–60)
pH, Ven: 7.399 (ref 7.25–7.43)
pO2, Ven: 47 mmHg — ABNORMAL HIGH (ref 32–45)

## 2023-12-22 LAB — DIFFERENTIAL
Abs Immature Granulocytes: 0.06 K/uL (ref 0.00–0.07)
Basophils Absolute: 0.1 K/uL (ref 0.0–0.1)
Basophils Relative: 1 %
Eosinophils Absolute: 0.3 K/uL (ref 0.0–0.5)
Eosinophils Relative: 4 %
Immature Granulocytes: 1 %
Lymphocytes Relative: 31 %
Lymphs Abs: 2.3 K/uL (ref 0.7–4.0)
Monocytes Absolute: 0.9 K/uL (ref 0.1–1.0)
Monocytes Relative: 11 %
Neutro Abs: 3.9 K/uL (ref 1.7–7.7)
Neutrophils Relative %: 52 %

## 2023-12-22 LAB — PROTIME-INR
INR: 1.2 (ref 0.8–1.2)
Prothrombin Time: 15.9 s — ABNORMAL HIGH (ref 11.4–15.2)

## 2023-12-22 LAB — RAPID URINE DRUG SCREEN, HOSP PERFORMED
Amphetamines: NOT DETECTED
Barbiturates: NOT DETECTED
Benzodiazepines: NOT DETECTED
Cocaine: NOT DETECTED
Opiates: NOT DETECTED
Tetrahydrocannabinol: NOT DETECTED

## 2023-12-22 LAB — COMPREHENSIVE METABOLIC PANEL WITH GFR
ALT: 17 U/L (ref 0–44)
AST: 20 U/L (ref 15–41)
Albumin: 3.7 g/dL (ref 3.5–5.0)
Alkaline Phosphatase: 145 U/L — ABNORMAL HIGH (ref 38–126)
Anion gap: 11 (ref 5–15)
BUN: 19 mg/dL (ref 8–23)
CO2: 23 mmol/L (ref 22–32)
Calcium: 8.8 mg/dL — ABNORMAL LOW (ref 8.9–10.3)
Chloride: 102 mmol/L (ref 98–111)
Creatinine, Ser: 1.27 mg/dL — ABNORMAL HIGH (ref 0.61–1.24)
GFR, Estimated: 55 mL/min — ABNORMAL LOW (ref >60)
Glucose, Bld: 187 mg/dL — ABNORMAL HIGH (ref 70–99)
Potassium: 3.4 mmol/L — ABNORMAL LOW (ref 3.5–5.1)
Sodium: 136 mmol/L (ref 135–145)
Total Bilirubin: 0.6 mg/dL (ref 0.0–1.2)
Total Protein: 7 g/dL (ref 6.5–8.1)

## 2023-12-22 LAB — BRAIN NATRIURETIC PEPTIDE: B Natriuretic Peptide: 178.5 pg/mL — ABNORMAL HIGH (ref 0.0–100.0)

## 2023-12-22 LAB — CBG MONITORING, ED: Glucose-Capillary: 197 mg/dL — ABNORMAL HIGH (ref 70–99)

## 2023-12-22 LAB — ETHANOL: Alcohol, Ethyl (B): <15 mg/dL (ref <15)

## 2023-12-22 LAB — APTT: aPTT: 36 s (ref 24–36)

## 2023-12-22 MED ORDER — FOLIC ACID 1 MG PO TABS
0.5000 mg | ORAL_TABLET | Freq: Every day | ORAL | Status: DC
  Administered 2023-12-23 – 2023-12-25 (×3): 0.5 mg via ORAL
  Filled 2023-12-22 (×4): qty 1

## 2023-12-22 MED ORDER — SENNA 8.6 MG PO TABS: 1.0000 | ORAL_TABLET | Freq: Every day | ORAL | Status: DC | PRN

## 2023-12-22 MED ORDER — FLUOXETINE HCL 10 MG PO CAPS
10.0000 mg | ORAL_CAPSULE | Freq: Every day | ORAL | Status: DC
Start: 2023-12-23 — End: 2023-12-25
  Administered 2023-12-23 – 2023-12-25 (×3): 10 mg via ORAL
  Filled 2023-12-22 (×5): qty 1

## 2023-12-22 MED ORDER — IOHEXOL 350 MG/ML SOLN
75.0000 mL | Freq: Once | INTRAVENOUS | Status: AC | PRN
  Administered 2023-12-22: 75 mL via INTRAVENOUS

## 2023-12-22 MED ORDER — MIRTAZAPINE 15 MG PO TABS
7.5000 mg | ORAL_TABLET | Freq: Every day | ORAL | Status: DC
  Administered 2023-12-22 – 2023-12-24 (×3): 7.5 mg via ORAL
  Filled 2023-12-22 (×3): qty 1

## 2023-12-22 MED ORDER — SODIUM CHLORIDE 0.9 % IV SOLN: INTRAVENOUS | Status: DC

## 2023-12-22 MED ORDER — ACETAMINOPHEN 650 MG RE SUPP: 650.0000 mg | RECTAL | Status: DC | PRN

## 2023-12-22 MED ORDER — METHYLPREDNISOLONE SODIUM SUCC 125 MG IJ SOLR
125.0000 mg | INTRAMUSCULAR | Status: AC
  Administered 2023-12-22: 125 mg via INTRAVENOUS
  Filled 2023-12-22: qty 2

## 2023-12-22 MED ORDER — ACETAMINOPHEN 160 MG/5ML PO SOLN: 650.0000 mg | ORAL | Status: DC | PRN

## 2023-12-22 MED ORDER — ATORVASTATIN CALCIUM 10 MG PO TABS
20.0000 mg | ORAL_TABLET | Freq: Every day | ORAL | Status: DC
  Administered 2023-12-23: 20 mg via ORAL
  Filled 2023-12-22: qty 2

## 2023-12-22 MED ORDER — GUAIFENESIN 100 MG/5ML PO LIQD
5.0000 mL | ORAL | Status: DC | PRN
  Administered 2023-12-24 (×2): 5 mL via ORAL
  Filled 2023-12-22 (×2): qty 5

## 2023-12-22 MED ORDER — POTASSIUM CHLORIDE CRYS ER 20 MEQ PO TBCR
20.0000 meq | EXTENDED_RELEASE_TABLET | Freq: Once | ORAL | Status: AC
  Administered 2023-12-22: 20 meq via ORAL
  Filled 2023-12-22: qty 1

## 2023-12-22 MED ORDER — HYDROXYZINE HCL 25 MG PO TABS
25.0000 mg | ORAL_TABLET | Freq: Four times a day (QID) | ORAL | Status: DC | PRN
  Administered 2023-12-24 (×2): 25 mg via ORAL
  Filled 2023-12-22 (×2): qty 1

## 2023-12-22 MED ORDER — IPRATROPIUM-ALBUTEROL 0.5-2.5 (3) MG/3ML IN SOLN
3.0000 mL | Freq: Four times a day (QID) | RESPIRATORY_TRACT | Status: DC | PRN
  Administered 2023-12-23 – 2023-12-25 (×5): 3 mL via RESPIRATORY_TRACT
  Filled 2023-12-22 (×5): qty 3

## 2023-12-22 MED ORDER — IPRATROPIUM-ALBUTEROL 0.5-2.5 (3) MG/3ML IN SOLN
3.0000 mL | Freq: Once | RESPIRATORY_TRACT | Status: AC
  Administered 2023-12-22: 3 mL via RESPIRATORY_TRACT
  Filled 2023-12-22: qty 3

## 2023-12-22 MED ORDER — APIXABAN 5 MG PO TABS
5.0000 mg | ORAL_TABLET | Freq: Two times a day (BID) | ORAL | Status: DC
  Administered 2023-12-22 – 2023-12-24 (×4): 5 mg via ORAL
  Filled 2023-12-22 (×4): qty 1

## 2023-12-22 MED ORDER — PROCHLORPERAZINE EDISYLATE 10 MG/2ML IJ SOLN: 5.0000 mg | Freq: Four times a day (QID) | INTRAMUSCULAR | Status: DC | PRN

## 2023-12-22 MED ORDER — STROKE: EARLY STAGES OF RECOVERY BOOK
Freq: Once | Status: DC
  Filled 2023-12-22: qty 1

## 2023-12-22 MED ORDER — TRAZODONE HCL 50 MG PO TABS
50.0000 mg | ORAL_TABLET | Freq: Every day | ORAL | Status: DC
  Administered 2023-12-22 – 2023-12-24 (×3): 50 mg via ORAL
  Filled 2023-12-22 (×3): qty 1

## 2023-12-22 MED ORDER — ACETAMINOPHEN 325 MG PO TABS
650.0000 mg | ORAL_TABLET | ORAL | Status: DC | PRN
  Administered 2023-12-24: 650 mg via ORAL
  Filled 2023-12-22: qty 2

## 2023-12-22 NOTE — ED Provider Notes (Signed)
 [place] EMERGENCY DEPARTMENT AT [facility] Provider Note   CSN: [id number] Arrival date & time: 12/22/23  1740  An emergency department physician performed an initial assessment on this suspected stroke patient at 1740.  Patient presents with: Code Stroke   Edward Maynard is a 89 y.o. male.   89 male with a history of tobacco use, right MCA infarct status post thrombectomy with reocclusion and residual left-sided weakness, atrial fibrillation on Eliquis , and hyperlipidemia who presents emergency department difficulty speaking.  History obtained per EMS.  At 4:50 PM he was doing well and then was noticed by staff at his facility ([name]) to have a change in his speech and right-sided facial droop.  No headache.  Has had some mild shortness of breath recently.       Prior to Admission medications   Medication Sig Start Date End Date Taking? Authorizing Provider  acetaminophen  (TYLENOL ) 325 MG tablet Take 2 tablets (650 mg total) by mouth every 4 (four) hours as needed for mild pain (or temp > 37.5 C (99.5 F)). 06/07/22  Yes Angiulli, Toribio PARAS, PA-C  amLODipine  (NORVASC ) 10 MG tablet Take 1 tablet (10 mg total) by mouth daily. 05/19/22  Yes Waddell Karna LABOR, NP  apixaban  (ELIQUIS ) 5 MG TABS tablet Take 1 tablet (5 mg total) by mouth 2 (two) times daily. 05/18/22  Yes Waddell Karna LABOR, NP  atorvastatin  (LIPITOR) 20 MG tablet Take 1 tablet (20 mg total) by mouth daily. 05/19/22  Yes Waddell Karna LABOR, NP  Cholecalciferol 25 MCG (1000 UT) capsule Take 1,000 Units by mouth daily.   Yes [provider]  docusate sodium  (COLACE) 100 MG capsule Take 1 capsule (100 mg total) by mouth daily as needed for mild constipation. 11/03/22  Yes Wert, Tawni, NP  Emollient (AQUAPHOR OINTMENT BODY EX) Apply 1 application  topically daily. To bilateral extremities   Yes [provider]  FLUoxetine (PROZAC) 10 MG capsule Take 10 mg by mouth daily.   Yes [provider]   folic acid  (FOLVITE ) 400 MCG tablet Take 400 mcg by mouth daily.   Yes [provider]  hydrocortisone 1 % ointment Apply 1 Application topically 3 (three) times daily as needed for itching.   Yes [provider]  hydrOXYzine  (ATARAX ) 25 MG tablet Take 25 mg by mouth every 6 (six) hours as needed for anxiety or itching.   Yes [provider]  mirtazapine  (REMERON ) 7.5 MG tablet Take 7.5 mg by mouth at bedtime.   Yes [provider]  traZODone  (DESYREL ) 50 MG tablet Take 50 mg by mouth at bedtime.   Yes [provider]  triamcinolone  cream (KENALOG ) 0.1 % Apply 1 Application topically 2 (two) times daily as needed (rash). Affected areas of leg   Yes [provider]  mirtazapine  (REMERON  SOL-TAB) 15 MG disintegrating tablet Take 1 tablet (15 mg total) by mouth at bedtime. Patient not taking: Reported on 12/22/2023 09/20/22   Darlean Tawni, NP    Allergies: Patient has no known allergies.    Review of Systems  Updated Vital Signs BP 139/70   Pulse 72   Temp 97.6 F (36.4 C) (Oral)   Resp 14   Ht 6' (1.829 m)   Wt 104.4 kg   SpO2 99%   BMI 31.22 kg/m   Physical Exam Vitals and nursing note reviewed.  Constitutional:      General: He is not in acute distress.    Appearance: He is well-developed.  HENT:     Head: Normocephalic and atraumatic.     Right Ear: External ear normal.     Left Ear: External ear normal.     Nose: Nose normal.  Eyes:     Extraocular Movements: Extraocular movements intact.     Conjunctiva/sclera: Conjunctivae normal.     Pupils: Pupils are equal, round, and reactive to light.  Cardiovascular:     Rate and Rhythm: Normal rate. Rhythm irregular.     Heart sounds: Normal heart sounds.  Pulmonary:     Effort: Pulmonary effort is normal. No respiratory distress.     Breath sounds: Wheezing present.     Comments: On 2 L nasal cannula Musculoskeletal:     Cervical back: Normal range of motion and neck  supple.     Right lower leg: Edema (1+) present.     Left lower leg: Edema (1+) present.  Skin:    General: Skin is warm and dry.  Neurological:     Mental Status: He is alert.     Comments: NIHSS Exam  Level of Consciousness: Alert  LOC Questions: Answers Age Correctly but not month was September LOC Commands: Opens and Closes Eyes and Hands on command  Best Gaze: Horizontal ocular movements intact  Visual Fields: Left hemianopia Facial Palsy: Mild right nasolabial fold flattening L Upper Extremity Motor: Drift and hit stretcher R Upper Extremity Motor: No drift after 10 seconds  L Lower extremity Motor: No drift after 5 seconds  R Lower extremity Motor: No drift after 5 seconds  Sensory: Diminished sensation to light touch in left lower extremity Best Language: Difficulty with repetition Dysarthria: Dysarthria present Neglect: No visual or sensory neglect    Psychiatric:        Mood and Affect: Mood normal.        Behavior: Behavior normal.     (all labs ordered are listed, but only abnormal results are displayed) Labs Reviewed  PROTIME-INR - Abnormal; Notable for the following components:      Result Value   Prothrombin Time 15.9 (*)    All other components within normal limits  COMPREHENSIVE METABOLIC PANEL WITH GFR - Abnormal; Notable for the following components:   Potassium 3.4 (*)    Glucose, Bld 187 (*)    Creatinine, Ser 1.27 (*)    Calcium  8.8 (*)    Alkaline Phosphatase 145 (*)    GFR, Estimated 55 (*)    All other components within normal limits  BRAIN NATRIURETIC PEPTIDE - Abnormal; Notable for the following components:   B Natriuretic Peptide 178.5 (*)    All other components within normal limits  CBG MONITORING, ED - Abnormal; Notable for the following components:   Glucose-Capillary 197 (*)    All other components within normal limits  I-STAT CHEM 8, ED - Abnormal; Notable for the following components:   Creatinine, Ser 1.30 (*)    Glucose, Bld 197  (*)    Calcium , Ion 1.07 (*)    All other components within normal limits  I-STAT VENOUS BLOOD GAS, ED - Abnormal; Notable for the following components:   pCO2, Ven 40.0 (*)    pO2, Ven 47 (*)    Potassium 3.4 (*)    All other components within normal limits  RESP PANEL BY RT-PCR (RSV, FLU A&B, COVID)  RVPGX2  ETHANOL  APTT  CBC  DIFFERENTIAL  RAPID URINE DRUG SCREEN, HOSP PERFORMED    EKG: EKG Interpretation Date/Time:  Saturday December 22 2023 18:04:46 EDT  Ventricular Rate:  82 PR Interval:  172 QRS Duration:  98 QT Interval:  402 QTC Calculation: 470 R Axis:   12  Text Interpretation: Sinus rhythm Abnormal inferior Q waves Nonspecific repol abnormality, lateral leads Confirmed by Yolande Charleston [phone]) on 12/22/2023 6:38:32 PM  Radiology: CT ANGIO HEAD NECK W WO CM (CODE STROKE) Addendum Date: 12/22/2023 ADDENDUM REPORT: 12/22/2023 19:11 ADDENDUM: CTA head impression #1 called by telephone at the time of interpretation on 12/22/2023 at 7:08 pm to provider Dr. Jakie, who verbally acknowledged these results. The progressive focus of ulcerated plaque at the distal right common carotid artery was also discussed at this time. Electronically Signed   By: Rockey Childs D.O.   On: 12/22/2023 19:11   Result Date: 12/22/2023 CLINICAL DATA:  Provided history: Neuro deficit, acute, stroke suspected. EXAM: CT ANGIOGRAPHY HEAD AND NECK WITH AND WITHOUT CONTRAST TECHNIQUE: Multidetector CT imaging of the head and neck was performed using the standard protocol during bolus administration of intravenous contrast. Multiplanar CT image reconstructions and MIPs were obtained to evaluate the vascular anatomy. Carotid stenosis measurements (when applicable) are obtained utilizing NASCET criteria, using the distal internal carotid diameter as the denominator. RADIATION DOSE REDUCTION: This exam was performed according to the departmental dose-optimization program which includes automated exposure  control, adjustment of the mA and/or kV according to patient size and/or use of iterative reconstruction technique. CONTRAST:  75mL OMNIPAQUE  IOHEXOL  350 MG/ML SOLN COMPARISON:  Noncontrast head CT performed earlier today 12/22/2023. MRA head 05/10/2022. CT angiogram head/neck 05/10/2022. FINDINGS: CTA NECK FINDINGS Aortic arch: Standard aortic branching. Atherosclerotic plaque within the visualized aortic arch and proximal major branch vessels of the neck. Streak/beam hardening artifact arising from a dense contrast bolus partially obscures the left subclavian artery. Within this limitation, there is no appreciable hemodynamically significant innominate or proximal subclavian artery stenosis. Right carotid system: CCA and ICA patent within the neck. Atherosclerotic plaque. Muscle the, there is an sclerotic plaque at the carotid bifurcation and within the proximal ICA resulting in less than 50% stenosis. Progressive focus of ulcerated plaque anteriorly at the distal common carotid artery, now measuring 4 mm (series 5, image 230). Partially retropharyngeal course of the cervical ICA. Left carotid system: CCA and ICA patent within the neck. Atherosclerotic plaque. Most notably, progressive (mildly calcified) plaque within the proximal ICA results in an estimated 60-70% stenosis which has progressed (series 5, images 214 and 215). Vertebral arteries: Patent within the neck. Severe stenosis at the right vertebral artery origin, unchanged. Atherosclerotic plaque within the right vertebral artery at the V3/V4 junction with up to moderate stenosis. The left vertebral artery is non dominant and developmentally diminutive, but patent throughout the neck. Atherosclerotic plaque at the left vertebral artery origin. Skeleton: The patient is edentulous. Spondylosis of the cervical and visible thoracic levels. No acute fracture or aggressive osseous lesion. Other neck: 12 mm ovoid nodule again demonstrated within the superficial  lobe of the right parotid gland. This is suspicious for a primary parotid neoplasm. Upper chest: Incompletely imaged cystic or cavitary lesion within the right lower lobe (measuring at least 4.6 cm (series 5, image 344). Mild dependent atelectasis within the bilateral upper lobes and right lower lobe at the imaged levels. Review of the MIP images confirms the above findings CTA HEAD FINDINGS Anterior circulation: The intracranial internal carotid arteries are patent. The right MCA proximal M2 branch which was occluded on the prior CTA of 05/10/2022 is occluded more proximally on today's study. Additionally, there is a new  severe stenosis within this vessel immediately upstream. Additional atherosclerotic irregularity of more distal M2 right MCA vessels. The left middle cerebral artery M1 segment is patent. No left M2 proximal branch occlusion or high-grade proximal stenosis is identified. The anterior cerebral arteries are patent. No intracranial aneurysm is identified. Posterior circulation: The intracranial vertebral arteries are patent. Progressive atherosclerotic plaque within the right vertebral artery at the V3/V4 junction now with up to moderate stenosis. The basilar artery is patent. The posterior cerebral arteries are patent. Atherosclerotic irregularity of both vessels without high-grade proximal stenosis. Fetal origin right PCA. The left posterior communicating artery is diminutive or absent. Venous sinuses: Limited assessment for dural venous sinus thrombosis due to contrast timing. Anatomic variants: As described. Review of the MIP images confirms the above findings Attempts are being made to reach the ordering provider at this time. IMPRESSION: CTA neck: 1. The common carotid and internal carotid arteries are patent within the neck. Atherosclerotic plaque bilaterally, as described. Progressive atherosclerotic plaque within the proximal left internal carotid artery now with an estimated 60-70% stenosis  at this site. Progressive focus of ulcerated plaque anteriorly at the distal right common carotid artery. 2. The right vertebral artery is dominant and patent within the neck. Unchanged severe atherosclerotic narrowing at the right vertebral artery origin. Progressive atherosclerotic plaque within the right vertebral artery at the V3/V4 junction resulting in up to moderate stenosis. 3. The left vertebral artery is non-dominant and developmentally diminutive, but patent throughout the neck. 4. Aortic Atherosclerosis (ICD10-I70.0). 5. Partially imaged cystic or cavitary pulmonary lesion within the right lower lobe. A dedicated chest CT is recommended for further characterization. 6. Unchanged 12 mm right parotid gland nodule suspicious for a primary parotid neoplasm. ENT follow-up recommended. CTA head: 1. The right middle cerebral artery proximal M2 branch which was occluded on the prior CTA of 05/10/2022 is occluded more proximally on today's study. Additionally, there is a new severe stenosis within this vessel immediately upstream. 2. No proximal intracranial large vessel occlusion identified elsewhere. 3. Background intracranial atherosclerotic disease as described. Electronically Signed: By: Rockey Childs D.O. On: 12/22/2023 19:06   DG Chest Portable 1 View Result Date: 12/22/2023 CLINICAL DATA:  Shortness of breath EXAM: PORTABLE CHEST - 1 VIEW COMPARISON:  July 04, 2013 FINDINGS: Low lung volumes with bronchovascular crowding. Elevation of the right hemidiaphragm with streaky right basilar atelectasis. Biapical pleural thickening. No pneumothorax or pleural effusion. Mild cardiomegaly.Cardiac loop recorder device.No acute fracture or destructive lesion. Multilevel thoracic osteophytosis. IMPRESSION: No acute cardiopulmonary abnormality. Electronically Signed   By: Rogelia Myers M.D.   On: 12/22/2023 18:49   CT HEAD CODE STROKE WO CONTRAST Result Date: 12/22/2023 CLINICAL DATA:  Code stroke. Neuro  deficit, acute, stroke suspected. Dysarthria. Right facial droop. EXAM: CT HEAD WITHOUT CONTRAST TECHNIQUE: Contiguous axial images were obtained from the base of the skull through the vertex without intravenous contrast. RADIATION DOSE REDUCTION: This exam was performed according to the departmental dose-optimization program which includes automated exposure control, adjustment of the mA and/or kV according to patient size and/or use of iterative reconstruction technique. COMPARISON:  Head CT 05/16/2022. FINDINGS: Brain: Generalized cerebral atrophy. Large chronic cortical/subcortical right MCA/PCA territory infarct, increased in extent as compared to the prior head CT of 05/16/2022. Moderate-sized cortical/subcortical infarct within the mid left frontal lobe (MCA territory), new from the prior CT but chronic in appearance. Redemonstrated chronic cortical/subcortical infarct within the left parietal lobe (MCA territory). Unchanged chronic lacunar infarct within/about the right basal ganglia. Unchanged  chronic lacunar infarct within the left thalamus. An infarct within the right thalamus is new from the prior head CT, however, there is associated volume loss at this site and this appears chronic. Background patchy and ill-defined hypoattenuation within the cerebral white matter, nonspecific but compatible with chronic small vessel ischemic disease. Chronic infarcts within the bilateral cerebellar hemispheres, not appreciably changed. There is no acute intracranial hemorrhage. No acute demarcated cortical infarct is identified. No extra-axial fluid collection. No evidence of an intracranial mass. No midline shift. Vascular: No hyperdense vessel.  Atherosclerotic calcifications. Skull: No calvarial fracture or aggressive osseous lesion. Sinuses/Orbits: No mass or acute finding within the imaged orbits. No significant paranasal sinus disease at the imaged levels. Other: Small left mastoid effusion. Associated chronic  sclerotic changes within left mastoid air cells. ASPECTS [facility] Stroke Program Early CT Score) - Ganglionic level infarction (caudate, lentiform nuclei, internal capsule, insula, M1-M3 cortex): 7 - Supraganglionic infarction (M4-M6 cortex): 3 Total score (0-10 with 10 being normal): 10 No evidence of an acute intracranial abnormality. These results were communicated to Dr. Merrianne at 6:26 pmon 10/4/2025by text page via the [facility] messaging system. IMPRESSION: 1. No evidence of an acute intracranial abnormality. 2. Parenchymal atrophy, chronic small vessel ischemic disease and chronic infarcts, as described. 3. Small left mastoid effusion. Associated chronic sclerotic changes within left mastoid air cells. Electronically Signed   By: Rockey Childs D.O.   On: 12/22/2023 18:27     Procedures   Medications Ordered in the ED  ipratropium-albuterol (DUONEB) 0.5-2.5 (3) MG/3ML nebulizer solution 3 mL (3 mLs Nebulization Given 12/22/23 1822)  iohexol  (OMNIPAQUE ) 350 MG/ML injection 75 mL (75 mLs Intravenous Contrast Given 12/22/23 1759)  methylPREDNISolone  sodium succinate (SOLU-MEDROL ) 125 mg/2 mL injection 125 mg (125 mg Intravenous Given 12/22/23 1822)    Clinical Course as of 12/22/23 2001  Sat Dec 22, 2023  1907 Dw Dr Geoffrey from radiology states that the pt appears to have R M2 stroke appears to have occluded proximally with upstream stenosis of the MCA. [RP]  1913 Discussed CTA findings with Dr Lindzen from neuro.  [RP]  1933 Dr Charlton from hospitalist to admit [RP]    Clinical Course User Index [RP] Yolande Lamar BROCKS, MD                                 Medical Decision Making Amount and/or Complexity of Data Reviewed Labs: ordered. Radiology: ordered.  Risk Prescription drug management. Decision regarding hospitalization.   89 year old male history of tobacco use, right MCA infarct with residual left-sided weakness, atrial fibrillation on Eliquis , and hyperlipidemia presents to the  emergency department with dysarthria  Initial Ddx:  Stroke, ICH, hypoglycemia, electrolyte abnormality  MDM/Course:  Patient presents to the emergency department as a code stroke activation.  Has noticed some right sided weakness as well as dysarthria.  Does have old deficits on the left side from his prior stroke.  Since his symptoms did occur an hour prior to arrival code stroke was activated.  Not a TNK candidate because of his Eliquis  use.  With his exam did obtain a CTA which does not show evidence of a target for thrombectomy.  This was discussed with neurology who felt that he should be admitted to the hospital for MRI and continued stroke workup.  Of note his imaging did incidentally show pulmonary lesion and possible parotid neoplasm will need to have follow-up.  Upon re-evaluation patient without  any new stroke symptoms.  Patient also having significant wheezing.  Was treated for COPD exacerbation.  Does not appear to be in respiratory distress at this time.  Admitted to hospitalist for further management.    This patient presents to the ED for concern of complaints listed in HPI, this involves an extensive number of treatment options, and is a complaint that carries with it a high risk of complications and morbidity. Disposition including potential need for admission considered.   Dispo: Admit to Floor  Additional history obtained from spouse Records reviewed Outpatient Clinic Notes The following labs were independently interpreted: Chemistry and show CKD I independently reviewed the following imaging with scope of interpretation limited to determining acute life threatening conditions related to emergency care: Chest x-ray and agree with the radiologist interpretation with the following exceptions: none I personally reviewed and interpreted cardiac monitoring: normal sinus rhythm  I personally reviewed and interpreted the pt's EKG: see above for interpretation  I have reviewed the  patients home medications and made adjustments as needed Consults: Hospitalist and Neurology Social Determinants of health:  Geriatric  CRITICAL CARE Performed by: Lamar JAYSON Shan   Total critical care time: 30 minutes  Critical care time was exclusive of separately billable procedures and treating other patients.  Critical care was necessary to treat or prevent imminent or life-threatening deterioration.  Critical care was time spent personally by me on the following activities: development of treatment plan with patient and/or surrogate as well as nursing, discussions with consultants, evaluation of patient's response to treatment, examination of patient, obtaining history from patient or surrogate, ordering and performing treatments and interventions, ordering and review of laboratory studies, ordering and review of radiographic studies, pulse oximetry and re-evaluation of patient's condition.   Portions of this note were generated with Scientist, clinical (histocompatibility and immunogenetics). Dictation errors may occur despite best attempts at proofreading.     Final diagnoses:  Stroke-like symptoms  Dysarthria  Acute bronchitis, unspecified organism    ED Discharge Orders     None          Shan Lamar JAYSON, MD 12/22/23 2001

## 2023-12-22 NOTE — ED Triage Notes (Signed)
 BIB EMS from [name] facility r/t Dysarthria and Right Facial Droop that started today. LKW [id number] 12/22/2023. Hx Stroke with left side deficit. Noted wheezing. On 2L [place]. On eliquis .

## 2023-12-22 NOTE — Consult Note (Signed)
 NEUROLOGY CONSULT NOTE   Date of service: December 22, 2023 Patient Name: Edward Maynard MRN:  [id number] DOB:  1934-01-10 Chief Complaint: Dysarthria with dysphasia Requesting Provider: Yolande Lamar BROCKS, MD  History of Present Illness  Edward Maynard is a 89 y.o. male with a PMHx of atrial fibrillation on Eliquis , HLD, right MCA stroke s/p thrombectomy with reocclusion and residual left-sided weakness, who presents to the ED with new onset of right facial droop, dysarthria with difficulty speaking. At 4:50 PM he was doing well and then was noticed by staff at his facility ([name]) to have a change in his speech and right-sided facial droop. No headache. Has had some mild shortness of breath recently.   LKW: 1700 Modified rankin score: 3-Moderate disability-requires help but walks WITHOUT assistance IV Thrombolysis: No: On Eliquis  EVT: No: CTA negative for LVO   NIHSS components Score: Comment  1a Level of Conscious 0[x]  1[]  2[]  3[]      1b LOC Questions 0[x]  1[]  2[]       1c LOC Commands 0[x]  1[]  2[]       2 Best Gaze 0[x]  1[]  2[]       3 Visual 0[]  1[]  2[x]  3[]     Chronic left hemianopsia  4 Facial Palsy 0[]  1[x]  2[]  3[]     Right  5a Motor Arm - left 0[]  1[]  2[x]  3[]  4[]  UN[]   Chronic  5b Motor Arm - Right 0[x]  1[]  2[]  3[]  4[]  UN[]    6a Motor Leg - Left 0[]  1[x]  2[]  3[]  4[]  UN[]    6b Motor Leg - Right 0[x]  1[]  2[]  3[]  4[]  UN[]    7 Limb Ataxia 0[]  1[x]  2[]  UN[]     Left, may be proportionate to his weakness  8 Sensory 0[]  1[x]  2[]  UN[]     Left  9 Best Language 0[x]  1[]  2[]  3[]      10 Dysarthria 0[]  1[x]  2[]  UN[]      11 Extinct. and Inattention 0[]  1[x]  2[]      LLE, to DSS  TOTAL:   10      ROS  Has some SOB. Comprehensive ROS performed with other pertinent positives as documented in HPI    Past History   Past Medical History:  Diagnosis Date   High cholesterol    05/2013; no defecits   Hyperlipemia    Left arm weakness    Left hand weakness    Personal history of  colonic polyp - adenoma  10/23/2013   Stroke (HCC) 05/2013   MRI on 07/02/13 = Multifocal acute & subacute infarction involving right frontal MCA/ACA & left parietal MCA/PCA watershed areas    Past Surgical History:  Procedure Laterality Date   IR CT HEAD LTD  05/10/2022   IR PERCUTANEOUS ART THROMBECTOMY/INFUSION INTRACRANIAL INC DIAG ANGIO  05/10/2022   LOOP RECORDER IMPLANT N/A 10/31/2013   Procedure: LOOP RECORDER IMPLANT;  Surgeon: Lynwood JONETTA Rakers, MD;  Location: [facility] CATH [facility];  Service: Cardiovascular;  Laterality: N/A;   RADIOLOGY WITH ANESTHESIA N/A 05/10/2022   Procedure: IR WITH ANESTHESIA;  Surgeon: Dolphus Carrion, MD;  Location: [facility] OR;  Service: Radiology;  Laterality: N/A;   SHOULDER SURGERY Bilateral 2003, 1995    Family History: Family History  Problem Relation Age of Onset   Hypertension Mother    Hypertension Father    Stroke Father    COPD Brother    Stroke Brother    Colon cancer Neg Hx    Heart attack Neg Hx     Social History  reports that he  quit smoking about 53 years ago. His smoking use included cigarettes. He has never used smokeless tobacco. He reports that he does not drink alcohol and does not use drugs.  No Known Allergies  Medications   Current Facility-Administered Medications:    ipratropium-albuterol (DUONEB) 0.5-2.5 (3) MG/3ML nebulizer solution 3 mL, 3 mL, Nebulization, Once, Yolande Lamar BROCKS, MD  Current Outpatient Medications:    acetaminophen  (TYLENOL ) 325 MG tablet, Take 2 tablets (650 mg total) by mouth every 4 (four) hours as needed for mild pain (or temp > 37.5 C (99.5 F))., Disp: , Rfl:    amLODipine  (NORVASC ) 10 MG tablet, Take 1 tablet (10 mg total) by mouth daily., Disp: 30 tablet, Rfl: 0   apixaban  (ELIQUIS ) 5 MG TABS tablet, Take 1 tablet (5 mg total) by mouth 2 (two) times daily., Disp: 60 tablet, Rfl: 0   atorvastatin  (LIPITOR) 20 MG tablet, Take 1 tablet (20 mg total) by mouth daily., Disp: 30 tablet, Rfl: 0   docusate sodium   (COLACE) 100 MG capsule, Take 1 capsule (100 mg total) by mouth daily as needed for mild constipation., Disp: , Rfl:    escitalopram (LEXAPRO) 5 MG tablet, Take 10 mg by mouth daily., Disp: , Rfl:    mirtazapine  (REMERON  SOL-TAB) 15 MG disintegrating tablet, Take 1 tablet (15 mg total) by mouth at bedtime., Disp: , Rfl:    traZODone  (DESYREL ) 50 MG tablet, Take 50 mg by mouth at bedtime., Disp: , Rfl:   Vitals   Vitals:   Jan 18, 2024 1700  Weight: 104.4 kg    Body mass index is 31.22 kg/m. BP 134/80   Pulse 74   Temp 97.6 F (36.4 C) (Oral)   Resp 16   Ht 6' (1.829 m)   Wt 104.4 kg   SpO2 97%   BMI 31.22 kg/m    Physical Exam   Constitutional: Appears well-developed and well-nourished.  Psych: Affect appropriate to situation.  Eyes: No scleral injection.  HENT: No OP obstruction.  Head: Normocephalic.  Respiratory: Grossly audible wheezing is noted.   Neurologic Examination   See NIHSS  Labs/Imaging/Neurodiagnostic studies   CBC:  Recent Labs  Lab 01/18/2024 1745  HGB 15.0  HCT 44.0   Basic Metabolic Panel:  Lab Results  Component Value Date   NA 139 2024/01/18   K 3.6 Jan 18, 2024   CO2 25 (A) 09/28/2022   GLUCOSE 197 (H) 2024/01/18   BUN 21 01-18-2024   CREATININE 1.30 (H) January 18, 2024   CALCIUM  8.8 09/28/2022   GFRNONAA 56 (L) 05/30/2022   GFRAA 65 04/04/2018   Lipid Panel:  Lab Results  Component Value Date   LDLCALC 56 05/10/2022   HgbA1c:  Lab Results  Component Value Date   HGBA1C 5.8 (H) 05/10/2022   Urine Drug Screen: No results found for: LABOPIA, COCAINSCRNUR, LABBENZ, AMPHETMU, THCU, LABBARB  Alcohol Level     Component Value Date/Time   ETH <10 05/10/2022 0005   INR  Lab Results  Component Value Date   INR 2.2 (H) 05/10/2022   APTT  Lab Results  Component Value Date   APTT 39 (H) 05/10/2022     ASSESSMENT  89 y.o. male with a PMHx of atrial fibrillation on Eliquis , HLD, right MCA stroke s/p thrombectomy with  reocclusion and residual left-sided weakness, who presents to the ED with new onset of right facial droop, dysarthria with difficulty speaking. - Exam reveals chronic left sided deficits from his prior stroke, in conjunction with new onset of right facial droop  and dysarthria. NIHSS 10.  - Imaging: - CT head: Large chronic cortical/subcortical right MCA/PCA territory infarct, increased in extent as compared to the prior head CT of 05/16/2022. Moderate-sized cortical/subcortical infarct within the mid left frontal lobe (MCA territory), new from the prior CT but chronic in appearance. Redemonstrated chronic cortical/subcortical infarct within the left parietal lobe (MCA territory). Unchanged chronic lacunar infarct within/about the right basal ganglia. Unchanged chronic lacunar infarct within the left thalamus. An infarct within the right thalamus is new from the prior head CT, however, there is associated volume loss at this site and this appears chronic. Background patchy and ill-defined hypoattenuation within the cerebral white matter, nonspecific but compatible with chronic small vessel ischemic disease. Chronic infarcts within the bilateral cerebellar hemispheres, not appreciably changed. There is no acute intracranial hemorrhage. No acute demarcated cortical infarct is identified. - CTA of neck: The common carotid and internal carotid arteries are patent within the neck. Atherosclerotic plaque bilaterally, as described. Progressive atherosclerotic plaque within the proximal left internal carotid artery now with an estimated 60-70% stenosis at this site. Progressive focus of ulcerated plaque anteriorly at the distal right common carotid artery. The right vertebral artery is dominant and patent within the neck. Unchanged severe atherosclerotic narrowing at the right vertebral artery origin. Progressive atherosclerotic plaque within the right vertebral artery at the V3/V4 junction resulting in up to moderate  stenosis. The left vertebral artery is non-dominant and developmentally diminutive, but patent throughout the neck.  Aortic atherosclerosis. Partially imaged cystic or cavitary pulmonary lesion within the right lower lobe. A dedicated chest CT is recommended for further characterization. Unchanged 12 mm right parotid gland nodule suspicious for a primary parotid neoplasm. ENT follow-up recommended. - CTA of head: The right middle cerebral artery proximal M2 branch which was occluded on the prior CTA of 05/10/2022 is occluded more proximally on today's study. Additionally, there is a new severe stenosis within this vessel immediately upstream. No proximal intracranial large vessel occlusion identified elsewhere. Background intracranial atherosclerotic disease as described.  - Impression:  - Acute onset of right facial droop, dysarthria and dysphasia. Dysphasia now resolved. Most likely etiology is a small acute left MCA stroke.  - Not an IV thrombolysis candidate due to anticoagulation.  - Not a thrombectomy candidate due to no acute LVO on CTA  RECOMMENDATIONS  - Continue his home Eliquis  and atorvastatin  - Modified permissive HTN protocol given advanced age. Treat if SBP > 180 - Gentle IVF - Diagnosis and management of his shortness of breath with wheezing, per primary team.  - HgbA1c, fasting lipid panel - MRI of the brain without contrast - PT consult, OT consult, Speech consult - Echocardiogram - Telemetry monitoring - Frequent neuro checks - NPO until passes stroke swallow screen - Stroke Team to follow in the morning  ______________________________________________________________________    Bonney SHARK, Kedron Uno, MD [facility]

## 2023-12-22 NOTE — Code Documentation (Signed)
 Stroke Response Nurse Documentation Code Documentation  DEVONN GIAMPIETRO is a 89 y.o. male arriving to [facility]  via [facility] on 12/22/23 with past medical hx of CVA, AF, HTN. On Eliquis  (apixaban ) daily. Code stroke was activated by EMS.   [facility] he [facility] LKW at 1650 and now complaining of new onset of dysarthria and right facial droop.  He has a prior CVA with Left side deficits.  Stroke team at the bedside on patient arrival. Labs drawn and patient cleared for CT by Dr. Yolande. Patient to CT with team. NIHSS 10, see documentation for details and code stroke times. Patient with left hemianopia, right facial droop, left arm weakness, left leg weakness, left limb ataxia, left decreased sensation, dysarthria , and left neglect on exam. The following imaging was completed:  CT Head and CTA. Patient is not a candidate for IV Thrombolytic due to taking Eliquis . Patient is not a candidate for IR due to no LVO on CTA.   Care Plan: VS and NIHSS q 2 hours x 12 hours then q 4 hours.    Bedside handoff with ED RN Arthea.    Elvin Portland  Stroke Response RN

## 2023-12-22 NOTE — ED Notes (Signed)
 CCMD called.

## 2023-12-22 NOTE — H&P (Addendum)
 History and Physical    Edward Maynard FMW:[id number] DOB: Oct 24, 1933 DOA: 12/22/2023  PCP: Sherre Rea, FNP   Patient coming from: ALF   Chief Complaint: Difficulty speaking, right facial droop   HPI: Edward Maynard is a 89 y.o. male with medical history significant for hypertension, hyperlipidemia, history of CVA with residual left-sided deficits, CKD 2, atrial fibrillation on Eliquis , depression, and anxiety, now presenting with speech difficulty and right facial droop.  Patient was said to be in his usual state at 4:50 PM but then developed dysarthria and was noted to have a right facial droop.  Patient reports that he feels well aside from his difficulty speaking.  He denies any recent chest pain or palpitations.  He has not noticed any new focal numbness or weakness.  Denies any recent fevers or chills.  He has had intermittent cough and shortness of breath over the past couple weeks.  He denies any history of COPD or asthma.  ED Course: Upon arrival to the ED, patient is found to be afebrile and saturating well on room air with normal HR and stable BP.  Labs are most notable for potassium 3.4, creatinine 1.27, normal CBC, BNP 179, and negative respiratory virus panel.  Head CT is negative for acute intracranial abnormality.  CTA head and neck demonstrates progressive atherosclerotic plaque, more proximal occlusion of the right MCA M2 branch than was previously seen, partially visualized lesion within the right lower lobe, and right parotid gland lesion.  Patient was evaluated by neurology in the emergency department and treated with 125 mg IV Solu-Medrol  and a DuoNeb.  Review of Systems:  All other systems reviewed and apart from HPI, are negative.  Past Medical History:  Diagnosis Date   High cholesterol    05/2013; no defecits   Hyperlipemia    Left arm weakness    Left hand weakness    Personal history of colonic polyp - adenoma  10/23/2013   Stroke (HCC) 05/2013   MRI on 07/02/13 =  Multifocal acute & subacute infarction involving right frontal MCA/ACA & left parietal MCA/PCA watershed areas    Past Surgical History:  Procedure Laterality Date   IR CT HEAD LTD  05/10/2022   IR PERCUTANEOUS ART THROMBECTOMY/INFUSION INTRACRANIAL INC DIAG ANGIO  05/10/2022   LOOP RECORDER IMPLANT N/A 10/31/2013   Procedure: LOOP RECORDER IMPLANT;  Surgeon: Lynwood JONETTA Rakers, MD;  Location: [facility] CATH [facility];  Service: Cardiovascular;  Laterality: N/A;   RADIOLOGY WITH ANESTHESIA N/A 05/10/2022   Procedure: IR WITH ANESTHESIA;  Surgeon: Dolphus Carrion, MD;  Location: [facility] OR;  Service: Radiology;  Laterality: N/A;   SHOULDER SURGERY Bilateral 2003, 1995    Social History:   reports that he quit smoking about 53 years ago. His smoking use included cigarettes. He has never used smokeless tobacco. He reports that he does not drink alcohol and does not use drugs.  No Known Allergies  Family History  Problem Relation Age of Onset   Hypertension Mother    Hypertension Father    Stroke Father    COPD Brother    Stroke Brother    Colon cancer Neg Hx    Heart attack Neg Hx      Prior to Admission medications   Medication Sig Start Date End Date Taking? Authorizing Provider  acetaminophen  (TYLENOL ) 325 MG tablet Take 2 tablets (650 mg total) by mouth every 4 (four) hours as needed for mild pain (or temp > 37.5 C (99.5 F)). 06/07/22  Yes  Angiulli, Toribio PARAS, PA-C  amLODipine  (NORVASC ) 10 MG tablet Take 1 tablet (10 mg total) by mouth daily. 05/19/22  Yes Waddell Karna LABOR, NP  apixaban  (ELIQUIS ) 5 MG TABS tablet Take 1 tablet (5 mg total) by mouth 2 (two) times daily. 05/18/22  Yes Waddell Karna LABOR, NP  atorvastatin  (LIPITOR) 20 MG tablet Take 1 tablet (20 mg total) by mouth daily. 05/19/22  Yes Waddell Karna LABOR, NP  Cholecalciferol 25 MCG (1000 UT) capsule Take 1,000 Units by mouth daily.   Yes [provider]  docusate sodium  (COLACE) 100 MG capsule Take 1 capsule (100 mg total) by mouth daily as  needed for mild constipation. 11/03/22  Yes Wert, Tawni, NP  Emollient (AQUAPHOR OINTMENT BODY EX) Apply 1 application  topically daily. To bilateral extremities   Yes [provider]  FLUoxetine (PROZAC) 10 MG capsule Take 10 mg by mouth daily.   Yes [provider]  folic acid  (FOLVITE ) 400 MCG tablet Take 400 mcg by mouth daily.   Yes [provider]  hydrocortisone 1 % ointment Apply 1 Application topically 3 (three) times daily as needed for itching.   Yes [provider]  hydrOXYzine  (ATARAX ) 25 MG tablet Take 25 mg by mouth every 6 (six) hours as needed for anxiety or itching.   Yes [provider]  mirtazapine  (REMERON ) 7.5 MG tablet Take 7.5 mg by mouth at bedtime.   Yes [provider]  traZODone  (DESYREL ) 50 MG tablet Take 50 mg by mouth at bedtime.   Yes [provider]  triamcinolone  cream (KENALOG ) 0.1 % Apply 1 Application topically 2 (two) times daily as needed (rash). Affected areas of leg   Yes [provider]    Physical Exam: Vitals:   12/22/23 1810 12/22/23 1815 12/22/23 1830 12/22/23 2100  BP:  (!) 143/69 139/70 134/80  Pulse:  79 72 74  Resp:  14 14 16   Temp:      TempSrc:      SpO2:  94% 99% 97%  Weight: 104.4 kg     Height: 6' (1.829 m)       Constitutional: NAD, calm  Eyes: PERTLA, lids and conjunctivae normal ENMT: Mucous membranes are moist. Posterior pharynx clear of any exudate or lesions.   Neck: supple, no masses  Respiratory: Speaking in full sentences. No wheezing.  Cardiovascular: S1 & S2 heard, regular rate and rhythm. Pretibial pitting edema.  Abdomen: No tenderness, soft. Bowel sounds active.  Musculoskeletal: no clubbing / cyanosis. No joint deformity upper and lower extremities.   Skin: no significant rashes, lesions, ulcers. Warm, dry, well-perfused. Neurologic: Dysarthria. Subtle lower right facial weakness. Left-sided weakness. Alert and oriented to person, place, and  situation.  Psychiatric: Pleasant. Cooperative.    Labs and Imaging on Admission: I have personally reviewed following labs and imaging studies  CBC: Recent Labs  Lab 12/22/23 1745 12/22/23 1752 12/22/23 1826  WBC  --  7.4  --   NEUTROABS  --  3.9  --   HGB 15.0 14.8 13.3  HCT 44.0 43.6 39.0  MCV  --  94.2  --   PLT  --  176  --    Basic Metabolic Panel: Recent Labs  Lab 12/22/23 1745 12/22/23 1752 12/22/23 1826  NA 139 136 139  K 3.6 3.4* 3.4*  CL 104 102  --   CO2  --  23  --   GLUCOSE 197* 187*  --   BUN 21 19  --  CREATININE 1.30* 1.27*  --   CALCIUM   --  8.8*  --    GFR: Estimated Creatinine Clearance: 51.2 mL/min (A) (by C-G formula based on SCr of 1.27 mg/dL (H)). Liver Function Tests: Recent Labs  Lab 12/22/23 1752  AST 20  ALT 17  ALKPHOS 145*  BILITOT 0.6  PROT 7.0  ALBUMIN 3.7   No results for input(s): LIPASE, AMYLASE in the last 168 hours. No results for input(s): AMMONIA in the last 168 hours. Coagulation Profile: Recent Labs  Lab 12/22/23 1752  INR 1.2   Cardiac Enzymes: No results for input(s): CKTOTAL, CKMB, CKMBINDEX, TROPONINI in the last 168 hours. BNP (last 3 results) No results for input(s): PROBNP in the last 8760 hours. HbA1C: No results for input(s): HGBA1C in the last 72 hours. CBG: Recent Labs  Lab 12/22/23 1742  GLUCAP 197*   Lipid Profile: No results for input(s): CHOL, HDL, LDLCALC, TRIG, CHOLHDL, LDLDIRECT in the last 72 hours. Thyroid  Function Tests: No results for input(s): TSH, T4TOTAL, FREET4, T3FREE, THYROIDAB in the last 72 hours. Anemia Panel: No results for input(s): VITAMINB12, FOLATE, FERRITIN, TIBC, IRON, RETICCTPCT in the last 72 hours. Urine analysis:    Component Value Date/Time   COLORURINE YELLOW 05/29/2022 1926   APPEARANCEUR CLEAR 05/29/2022 1926   LABSPEC 1.017 05/29/2022 1926   PHURINE 5.0 05/29/2022 1926   GLUCOSEU NEGATIVE 05/29/2022  1926   HGBUR NEGATIVE 05/29/2022 1926   BILIRUBINUR NEGATIVE 05/29/2022 1926   KETONESUR NEGATIVE 05/29/2022 1926   PROTEINUR NEGATIVE 05/29/2022 1926   NITRITE NEGATIVE 05/29/2022 1926   LEUKOCYTESUR NEGATIVE 05/29/2022 1926   Sepsis Labs: @LABRCNTIP (procalcitonin:4,lacticidven:4) ) Recent Results (from the past 240 hours)  Resp panel by RT-PCR (RSV, Flu A&B, Covid) Anterior Nasal Swab     Status: None   Collection Time: 12/22/23  5:55 PM   Specimen: Anterior Nasal Swab  Result Value Ref Range Status   SARS Coronavirus 2 by RT PCR NEGATIVE NEGATIVE Final   Influenza A by PCR NEGATIVE NEGATIVE Final   Influenza B by PCR NEGATIVE NEGATIVE Final    Comment: (NOTE) The Xpert Xpress SARS-CoV-2/FLU/RSV plus assay is intended as an aid in the diagnosis of influenza from Nasopharyngeal swab specimens and should not be used as a sole basis for treatment. Nasal washings and aspirates are unacceptable for Xpert Xpress SARS-CoV-2/FLU/RSV testing.  Fact Sheet for Patients: BloggerCourse.com  Fact Sheet for Healthcare Providers: SeriousBroker.it  This test is not yet approved or cleared by the [place]  FDA and has been authorized for detection and/or diagnosis of SARS-CoV-2 by FDA under an Emergency Use Authorization (EUA). This EUA will remain in effect (meaning this test can be used) for the duration of the COVID-19 declaration under Section 564(b)(1) of the Act, 21 U.S.C. section 360bbb-3(b)(1), unless the authorization is terminated or revoked.     Resp Syncytial Virus by PCR NEGATIVE NEGATIVE Final    Comment: (NOTE) Fact Sheet for Patients: BloggerCourse.com  Fact Sheet for Healthcare Providers: SeriousBroker.it  This test is not yet approved or cleared by the [place]  FDA and has been authorized for detection and/or diagnosis of SARS-CoV-2 by FDA under an Emergency  Use Authorization (EUA). This EUA will remain in effect (meaning this test can be used) for the duration of the COVID-19 declaration under Section 564(b)(1) of the Act, 21 U.S.C. section 360bbb-3(b)(1), unless the authorization is terminated or revoked.  Performed at [facility], [address] [place], [place] [address]  Radiological Exams on Admission: CT ANGIO HEAD NECK W WO CM (CODE STROKE) Addendum Date: 12/22/2023 ADDENDUM REPORT: 12/22/2023 19:11 ADDENDUM: CTA head impression #1 called by telephone at the time of interpretation on 12/22/2023 at 7:08 pm to provider Dr. Jakie, who verbally acknowledged these results. The progressive focus of ulcerated plaque at the distal right common carotid artery was also discussed at this time. Electronically Signed   By: Rockey Childs D.O.   On: 12/22/2023 19:11   Result Date: 12/22/2023 CLINICAL DATA:  Provided history: Neuro deficit, acute, stroke suspected. EXAM: CT ANGIOGRAPHY HEAD AND NECK WITH AND WITHOUT CONTRAST TECHNIQUE: Multidetector CT imaging of the head and neck was performed using the standard protocol during bolus administration of intravenous contrast. Multiplanar CT image reconstructions and MIPs were obtained to evaluate the vascular anatomy. Carotid stenosis measurements (when applicable) are obtained utilizing NASCET criteria, using the distal internal carotid diameter as the denominator. RADIATION DOSE REDUCTION: This exam was performed according to the departmental dose-optimization program which includes automated exposure control, adjustment of the mA and/or kV according to patient size and/or use of iterative reconstruction technique. CONTRAST:  75mL OMNIPAQUE  IOHEXOL  350 MG/ML SOLN COMPARISON:  Noncontrast head CT performed earlier today 12/22/2023. MRA head 05/10/2022. CT angiogram head/neck 05/10/2022. FINDINGS: CTA NECK FINDINGS Aortic arch: Standard aortic branching. Atherosclerotic plaque within the visualized  aortic arch and proximal major branch vessels of the neck. Streak/beam hardening artifact arising from a dense contrast bolus partially obscures the left subclavian artery. Within this limitation, there is no appreciable hemodynamically significant innominate or proximal subclavian artery stenosis. Right carotid system: CCA and ICA patent within the neck. Atherosclerotic plaque. Muscle the, there is an sclerotic plaque at the carotid bifurcation and within the proximal ICA resulting in less than 50% stenosis. Progressive focus of ulcerated plaque anteriorly at the distal common carotid artery, now measuring 4 mm (series 5, image 230). Partially retropharyngeal course of the cervical ICA. Left carotid system: CCA and ICA patent within the neck. Atherosclerotic plaque. Most notably, progressive (mildly calcified) plaque within the proximal ICA results in an estimated 60-70% stenosis which has progressed (series 5, images 214 and 215). Vertebral arteries: Patent within the neck. Severe stenosis at the right vertebral artery origin, unchanged. Atherosclerotic plaque within the right vertebral artery at the V3/V4 junction with up to moderate stenosis. The left vertebral artery is non dominant and developmentally diminutive, but patent throughout the neck. Atherosclerotic plaque at the left vertebral artery origin. Skeleton: The patient is edentulous. Spondylosis of the cervical and visible thoracic levels. No acute fracture or aggressive osseous lesion. Other neck: 12 mm ovoid nodule again demonstrated within the superficial lobe of the right parotid gland. This is suspicious for a primary parotid neoplasm. Upper chest: Incompletely imaged cystic or cavitary lesion within the right lower lobe (measuring at least 4.6 cm (series 5, image 344). Mild dependent atelectasis within the bilateral upper lobes and right lower lobe at the imaged levels. Review of the MIP images confirms the above findings CTA HEAD FINDINGS Anterior  circulation: The intracranial internal carotid arteries are patent. The right MCA proximal M2 branch which was occluded on the prior CTA of 05/10/2022 is occluded more proximally on today's study. Additionally, there is a new severe stenosis within this vessel immediately upstream. Additional atherosclerotic irregularity of more distal M2 right MCA vessels. The left middle cerebral artery M1 segment is patent. No left M2 proximal branch occlusion or high-grade proximal stenosis is identified. The anterior cerebral arteries are patent. No  intracranial aneurysm is identified. Posterior circulation: The intracranial vertebral arteries are patent. Progressive atherosclerotic plaque within the right vertebral artery at the V3/V4 junction now with up to moderate stenosis. The basilar artery is patent. The posterior cerebral arteries are patent. Atherosclerotic irregularity of both vessels without high-grade proximal stenosis. Fetal origin right PCA. The left posterior communicating artery is diminutive or absent. Venous sinuses: Limited assessment for dural venous sinus thrombosis due to contrast timing. Anatomic variants: As described. Review of the MIP images confirms the above findings Attempts are being made to reach the ordering provider at this time. IMPRESSION: CTA neck: 1. The common carotid and internal carotid arteries are patent within the neck. Atherosclerotic plaque bilaterally, as described. Progressive atherosclerotic plaque within the proximal left internal carotid artery now with an estimated 60-70% stenosis at this site. Progressive focus of ulcerated plaque anteriorly at the distal right common carotid artery. 2. The right vertebral artery is dominant and patent within the neck. Unchanged severe atherosclerotic narrowing at the right vertebral artery origin. Progressive atherosclerotic plaque within the right vertebral artery at the V3/V4 junction resulting in up to moderate stenosis. 3. The left  vertebral artery is non-dominant and developmentally diminutive, but patent throughout the neck. 4. Aortic Atherosclerosis (ICD10-I70.0). 5. Partially imaged cystic or cavitary pulmonary lesion within the right lower lobe. A dedicated chest CT is recommended for further characterization. 6. Unchanged 12 mm right parotid gland nodule suspicious for a primary parotid neoplasm. ENT follow-up recommended. CTA head: 1. The right middle cerebral artery proximal M2 branch which was occluded on the prior CTA of 05/10/2022 is occluded more proximally on today's study. Additionally, there is a new severe stenosis within this vessel immediately upstream. 2. No proximal intracranial large vessel occlusion identified elsewhere. 3. Background intracranial atherosclerotic disease as described. Electronically Signed: By: Rockey Childs D.O. On: 12/22/2023 19:06   DG Chest Portable 1 View Result Date: 12/22/2023 CLINICAL DATA:  Shortness of breath EXAM: PORTABLE CHEST - 1 VIEW COMPARISON:  July 04, 2013 FINDINGS: Low lung volumes with bronchovascular crowding. Elevation of the right hemidiaphragm with streaky right basilar atelectasis. Biapical pleural thickening. No pneumothorax or pleural effusion. Mild cardiomegaly.Cardiac loop recorder device.No acute fracture or destructive lesion. Multilevel thoracic osteophytosis. IMPRESSION: No acute cardiopulmonary abnormality. Electronically Signed   By: Rogelia Myers M.D.   On: 12/22/2023 18:49   CT HEAD CODE STROKE WO CONTRAST Result Date: 12/22/2023 CLINICAL DATA:  Code stroke. Neuro deficit, acute, stroke suspected. Dysarthria. Right facial droop. EXAM: CT HEAD WITHOUT CONTRAST TECHNIQUE: Contiguous axial images were obtained from the base of the skull through the vertex without intravenous contrast. RADIATION DOSE REDUCTION: This exam was performed according to the departmental dose-optimization program which includes automated exposure control, adjustment of the mA and/or kV  according to patient size and/or use of iterative reconstruction technique. COMPARISON:  Head CT 05/16/2022. FINDINGS: Brain: Generalized cerebral atrophy. Large chronic cortical/subcortical right MCA/PCA territory infarct, increased in extent as compared to the prior head CT of 05/16/2022. Moderate-sized cortical/subcortical infarct within the mid left frontal lobe (MCA territory), new from the prior CT but chronic in appearance. Redemonstrated chronic cortical/subcortical infarct within the left parietal lobe (MCA territory). Unchanged chronic lacunar infarct within/about the right basal ganglia. Unchanged chronic lacunar infarct within the left thalamus. An infarct within the right thalamus is new from the prior head CT, however, there is associated volume loss at this site and this appears chronic. Background patchy and ill-defined hypoattenuation within the cerebral white matter, nonspecific but  compatible with chronic small vessel ischemic disease. Chronic infarcts within the bilateral cerebellar hemispheres, not appreciably changed. There is no acute intracranial hemorrhage. No acute demarcated cortical infarct is identified. No extra-axial fluid collection. No evidence of an intracranial mass. No midline shift. Vascular: No hyperdense vessel.  Atherosclerotic calcifications. Skull: No calvarial fracture or aggressive osseous lesion. Sinuses/Orbits: No mass or acute finding within the imaged orbits. No significant paranasal sinus disease at the imaged levels. Other: Small left mastoid effusion. Associated chronic sclerotic changes within left mastoid air cells. ASPECTS [facility] Stroke Program Early CT Score) - Ganglionic level infarction (caudate, lentiform nuclei, internal capsule, insula, M1-M3 cortex): 7 - Supraganglionic infarction (M4-M6 cortex): 3 Total score (0-10 with 10 being normal): 10 No evidence of an acute intracranial abnormality. These results were communicated to Dr. Merrianne at 6:26 pmon  10/4/2025by text page via the [facility] messaging system. IMPRESSION: 1. No evidence of an acute intracranial abnormality. 2. Parenchymal atrophy, chronic small vessel ischemic disease and chronic infarcts, as described. 3. Small left mastoid effusion. Associated chronic sclerotic changes within left mastoid air cells. Electronically Signed   By: Rockey Childs D.O.   On: 12/22/2023 18:27    EKG: Independently reviewed. Sinus rhythm.   Assessment/Plan   1. Acute dysarthria and right facial droop; hx of right MCA stroke with residual deficits  - Passed swallow screen in ED; per ED physician, neurology recommends continuing Eliquis     - Continue cardiac monitoring and frequent neuro checks, check MRI brain, echocardiogram, A1c, and lipids, consult PT/OT/SLP, continue Eliquis  and Lipitor   2. RLL lesion  - Partially imaged RLL lesion noted on CTA neck in ED  - He has has intermittent respiratory symptoms for ~2 weeks; afebrile with normal RR and O2 sat in ED - Check chest CT   3. Atrial fibrillation  - Continue Eliquis     4. Hyperlipidemia  - Continue Lipitor   5. Depression; anxiety; insomnia  - Continue Prozac, Remeron , trazodone , and as-needed hydroxyzine     6. Right parotid lesion  - Noted on CT in ED  - Discussed this finding and recommendation for outpatient follow-up with patient and his daughter in the ED    DVT prophylaxis: Eliquis   Code Status: DNR/DNI  Level of Care: Level of care: Telemetry Medical Family Communication: Daughter at bedside  Disposition Plan:  Patient is from: ALF  Anticipated d/c is to: TBD Anticipated d/c date is: 10/5 or 12/24/23  Patient currently: Pending CVA workup, therapy assessments, CT chest Consults called: Neurology  Admission status: Observation     Evalene GORMAN Sprinkles, MD [facility]  12/22/2023, 9:11 PM

## 2023-12-22 NOTE — ED Notes (Signed)
 Assuming pt care, pt bib ems coming from ALF for slurred speech, and left side facial droop since 5pm today. Pt aaox4, speech slurred, weakness to left side noted per family left side weakness deficit from previous stroke, skin warm/dry. Pt denies pain/discomfort. Med hx. Htn, stroke, takes thinners. Family at bedside call bell within reach.

## 2023-12-23 ENCOUNTER — Observation Stay (HOSPITAL_COMMUNITY)

## 2023-12-23 DIAGNOSIS — R29703 NIHSS score 3: Secondary | ICD-10-CM | POA: Diagnosis not present

## 2023-12-23 DIAGNOSIS — Z5181 Encounter for therapeutic drug level monitoring: Secondary | ICD-10-CM | POA: Diagnosis not present

## 2023-12-23 DIAGNOSIS — I69391 Dysphagia following cerebral infarction: Secondary | ICD-10-CM | POA: Diagnosis not present

## 2023-12-23 DIAGNOSIS — I63541 Cerebral infarction due to unspecified occlusion or stenosis of right cerebellar artery: Secondary | ICD-10-CM | POA: Diagnosis present

## 2023-12-23 DIAGNOSIS — I129 Hypertensive chronic kidney disease with stage 1 through stage 4 chronic kidney disease, or unspecified chronic kidney disease: Secondary | ICD-10-CM | POA: Diagnosis present

## 2023-12-23 DIAGNOSIS — R471 Dysarthria and anarthria: Secondary | ICD-10-CM | POA: Diagnosis present

## 2023-12-23 DIAGNOSIS — Z743 Need for continuous supervision: Secondary | ICD-10-CM | POA: Diagnosis not present

## 2023-12-23 DIAGNOSIS — G9389 Other specified disorders of brain: Secondary | ICD-10-CM | POA: Diagnosis not present

## 2023-12-23 DIAGNOSIS — I6389 Other cerebral infarction: Secondary | ICD-10-CM | POA: Diagnosis not present

## 2023-12-23 DIAGNOSIS — E7800 Pure hypercholesterolemia, unspecified: Secondary | ICD-10-CM | POA: Diagnosis present

## 2023-12-23 DIAGNOSIS — I77810 Thoracic aortic ectasia: Secondary | ICD-10-CM | POA: Diagnosis present

## 2023-12-23 DIAGNOSIS — R29818 Other symptoms and signs involving the nervous system: Secondary | ICD-10-CM | POA: Diagnosis not present

## 2023-12-23 DIAGNOSIS — I4891 Unspecified atrial fibrillation: Secondary | ICD-10-CM

## 2023-12-23 DIAGNOSIS — F32A Depression, unspecified: Secondary | ICD-10-CM | POA: Diagnosis present

## 2023-12-23 DIAGNOSIS — K118 Other diseases of salivary glands: Secondary | ICD-10-CM | POA: Diagnosis present

## 2023-12-23 DIAGNOSIS — Z87891 Personal history of nicotine dependence: Secondary | ICD-10-CM | POA: Diagnosis not present

## 2023-12-23 DIAGNOSIS — R29810 Facial weakness: Secondary | ICD-10-CM | POA: Diagnosis present

## 2023-12-23 DIAGNOSIS — J189 Pneumonia, unspecified organism: Secondary | ICD-10-CM | POA: Diagnosis present

## 2023-12-23 DIAGNOSIS — Z8249 Family history of ischemic heart disease and other diseases of the circulatory system: Secondary | ICD-10-CM | POA: Diagnosis not present

## 2023-12-23 DIAGNOSIS — I482 Chronic atrial fibrillation, unspecified: Secondary | ICD-10-CM | POA: Diagnosis present

## 2023-12-23 DIAGNOSIS — I350 Nonrheumatic aortic (valve) stenosis: Secondary | ICD-10-CM | POA: Diagnosis present

## 2023-12-23 DIAGNOSIS — E785 Hyperlipidemia, unspecified: Secondary | ICD-10-CM

## 2023-12-23 DIAGNOSIS — R29710 NIHSS score 10: Secondary | ICD-10-CM | POA: Diagnosis present

## 2023-12-23 DIAGNOSIS — Z7901 Long term (current) use of anticoagulants: Secondary | ICD-10-CM | POA: Diagnosis not present

## 2023-12-23 DIAGNOSIS — I739 Peripheral vascular disease, unspecified: Secondary | ICD-10-CM | POA: Diagnosis not present

## 2023-12-23 DIAGNOSIS — Z66 Do not resuscitate: Secondary | ICD-10-CM | POA: Diagnosis present

## 2023-12-23 DIAGNOSIS — R1310 Dysphagia, unspecified: Secondary | ICD-10-CM | POA: Diagnosis present

## 2023-12-23 DIAGNOSIS — Z7401 Bed confinement status: Secondary | ICD-10-CM | POA: Diagnosis not present

## 2023-12-23 DIAGNOSIS — R9082 White matter disease, unspecified: Secondary | ICD-10-CM | POA: Diagnosis not present

## 2023-12-23 DIAGNOSIS — I6381 Other cerebral infarction due to occlusion or stenosis of small artery: Secondary | ICD-10-CM

## 2023-12-23 DIAGNOSIS — I69354 Hemiplegia and hemiparesis following cerebral infarction affecting left non-dominant side: Secondary | ICD-10-CM | POA: Diagnosis not present

## 2023-12-23 DIAGNOSIS — I480 Paroxysmal atrial fibrillation: Secondary | ICD-10-CM | POA: Diagnosis not present

## 2023-12-23 DIAGNOSIS — J984 Other disorders of lung: Secondary | ICD-10-CM | POA: Diagnosis present

## 2023-12-23 DIAGNOSIS — R7303 Prediabetes: Secondary | ICD-10-CM | POA: Diagnosis present

## 2023-12-23 DIAGNOSIS — R531 Weakness: Secondary | ICD-10-CM | POA: Diagnosis not present

## 2023-12-23 DIAGNOSIS — Z1152 Encounter for screening for COVID-19: Secondary | ICD-10-CM | POA: Diagnosis not present

## 2023-12-23 DIAGNOSIS — N1831 Chronic kidney disease, stage 3a: Secondary | ICD-10-CM | POA: Diagnosis present

## 2023-12-23 DIAGNOSIS — I6340 Cerebral infarction due to embolism of unspecified cerebral artery: Secondary | ICD-10-CM | POA: Diagnosis not present

## 2023-12-23 LAB — CBC
HCT: 42.2 % (ref 39.0–52.0)
Hemoglobin: 14.3 g/dL (ref 13.0–17.0)
MCH: 31.5 pg (ref 26.0–34.0)
MCHC: 33.9 g/dL (ref 30.0–36.0)
MCV: 93 fL (ref 80.0–100.0)
Platelets: 163 K/uL (ref 150–400)
RBC: 4.54 MIL/uL (ref 4.22–5.81)
RDW: 12.9 % (ref 11.5–15.5)
WBC: 5.9 K/uL (ref 4.0–10.5)
nRBC: 0 % (ref 0.0–0.2)

## 2023-12-23 LAB — ECHOCARDIOGRAM COMPLETE
AR max vel: 1.09 cm2
AV Area VTI: 1.08 cm2
AV Area mean vel: 1.03 cm2
AV Mean grad: 24 mmHg
AV Peak grad: 40.2 mmHg
Ao pk vel: 3.17 m/s
Height: 72 in
MV M vel: 6.01 m/s
MV Peak grad: 144.5 mmHg
P 1/2 time: 278 ms
S' Lateral: 3.8 cm
Weight: 3682.56 [oz_av]

## 2023-12-23 LAB — LIPID PANEL
Cholesterol: 127 mg/dL (ref 0–200)
HDL: 43 mg/dL (ref >40)
LDL Cholesterol: 78 mg/dL (ref 0–99)
Total CHOL/HDL Ratio: 3 ratio
Triglycerides: 29 mg/dL (ref <150)
VLDL: 6 mg/dL (ref 0–40)

## 2023-12-23 LAB — BASIC METABOLIC PANEL WITH GFR
Anion gap: 14 (ref 5–15)
BUN: 18 mg/dL (ref 8–23)
CO2: 20 mmol/L — ABNORMAL LOW (ref 22–32)
Calcium: 9 mg/dL (ref 8.9–10.3)
Chloride: 104 mmol/L (ref 98–111)
Creatinine, Ser: 1.32 mg/dL — ABNORMAL HIGH (ref 0.61–1.24)
GFR, Estimated: 52 mL/min — ABNORMAL LOW (ref >60)
Glucose, Bld: 206 mg/dL — ABNORMAL HIGH (ref 70–99)
Potassium: 4.1 mmol/L (ref 3.5–5.1)
Sodium: 138 mmol/L (ref 135–145)

## 2023-12-23 LAB — HEMOGLOBIN A1C
Hgb A1c MFr Bld: 5.6 % (ref 4.8–5.6)
Mean Plasma Glucose: 114.02 mg/dL

## 2023-12-23 MED ORDER — ATORVASTATIN CALCIUM 80 MG PO TABS
80.0000 mg | ORAL_TABLET | Freq: Every day | ORAL | Status: DC
  Administered 2023-12-24 – 2023-12-25 (×2): 80 mg via ORAL
  Filled 2023-12-23 (×2): qty 1

## 2023-12-23 MED ORDER — DILTIAZEM HCL 30 MG PO TABS
30.0000 mg | ORAL_TABLET | Freq: Three times a day (TID) | ORAL | Status: DC
  Administered 2023-12-23: 30 mg via ORAL
  Filled 2023-12-23 (×2): qty 1

## 2023-12-23 MED ORDER — DILTIAZEM HCL-DEXTROSE 125-5 MG/125ML-% IV SOLN (PREMIX)
5.0000 mg/h | INTRAVENOUS | Status: DC
  Administered 2023-12-23: 15 mg/h via INTRAVENOUS
  Administered 2023-12-23: 5 mg/h via INTRAVENOUS
  Filled 2023-12-23: qty 125

## 2023-12-23 MED ORDER — SODIUM CHLORIDE 0.9 % IV SOLN
2.0000 g | INTRAVENOUS | Status: DC
  Administered 2023-12-23 – 2023-12-25 (×3): 2 g via INTRAVENOUS
  Filled 2023-12-23 (×3): qty 20

## 2023-12-23 MED ORDER — DILTIAZEM HCL ER COATED BEADS 120 MG PO CP24
120.0000 mg | ORAL_CAPSULE | Freq: Every day | ORAL | Status: DC
  Administered 2023-12-23 – 2023-12-25 (×3): 120 mg via ORAL
  Filled 2023-12-23 (×3): qty 1

## 2023-12-23 NOTE — ED Notes (Signed)
Patient transported to ECHO.

## 2023-12-23 NOTE — ED Notes (Signed)
 PT assisted with urinal. 540 mL urine output.

## 2023-12-23 NOTE — Progress Notes (Addendum)
 STROKE TEAM PROGRESS NOTE    SIGNIFICANT HOSPITAL EVENTS 10/4 presented from his assisted living facility for right-sided facial droop and dysarthria  INTERIM HISTORY/SUBJECTIVE Patient seen at the bedside in echo suite in no apparent distress MRI brain with acute infarct in left precentral gyrus and acute lacunar infarct in right cerebellar hemisphere He is on Eliquis  at home and states he does not believe he misses any doses because the med tech administers it to him.  Will consider changing to Pradaxa if he can afford it  CBC    Component Value Date/Time   WBC 5.9 12/23/2023 0413   RBC 4.54 12/23/2023 0413   HGB 14.3 12/23/2023 0413   HGB 15.4 09/12/2021 1539   HCT 42.2 12/23/2023 0413   HCT 45.0 09/12/2021 1539   PLT 163 12/23/2023 0413   PLT 160 09/12/2021 1539   MCV 93.0 12/23/2023 0413   MCV 97 09/12/2021 1539   MCH 31.5 12/23/2023 0413   MCHC 33.9 12/23/2023 0413   RDW 12.9 12/23/2023 0413   RDW 12.8 09/12/2021 1539   LYMPHSABS 2.3 12/22/2023 1752   MONOABS 0.9 12/22/2023 1752   EOSABS 0.3 12/22/2023 1752   BASOSABS 0.1 12/22/2023 1752    BMET    Component Value Date/Time   NA 138 12/23/2023 0413   NA 142 09/28/2022 0000   K 4.1 12/23/2023 0413   CL 104 12/23/2023 0413   CO2 20 (L) 12/23/2023 0413   GLUCOSE 206 (H) 12/23/2023 0413   BUN 18 12/23/2023 0413   BUN 19 09/28/2022 0000   CREATININE 1.32 (H) 12/23/2023 0413   CALCIUM  9.0 12/23/2023 0413   EGFR 69 09/28/2022 0000   EGFR 63 09/12/2021 1539   GFRNONAA 52 (L) 12/23/2023 0413    IMAGING past 24 hours CT CHEST W CONTRAST Result Date: 12/22/2023 CLINICAL DATA:  Provided history: Respiratory illness, nondiagnostic xray EXAM: CT CHEST WITH CONTRAST TECHNIQUE: Multidetector CT imaging of the chest was performed during intravenous contrast administration. RADIATION DOSE REDUCTION: This exam was performed according to the departmental dose-optimization program which includes automated exposure control,  adjustment of the mA and/or kV according to patient size and/or use of iterative reconstruction technique. CONTRAST:  75mL OMNIPAQUE  IOHEXOL  350 MG/ML SOLN COMPARISON:  Radiograph earlier today FINDINGS: Cardiovascular: The heart is normal in size. No pericardial effusion. Coronary artery calcifications. Irregular plaque throughout the thoracic aorta. The ascending aorta is dilated at 4.5 cm. No dissection or acute aortic finding. No central pulmonary embolus on this non dedicated exam. Mediastinum/Nodes: Shotty mediastinal and hilar lymph nodes, all subcentimeter short axis. Decompressed esophagus. No thyroid  nodule. Lungs/Pleura: Elevated right hemidiaphragm. Adjacent airspace disease in the right lower and middle lobe likely compressive atelectasis. Moderate emphysema. Dependent ground-glass and consolidative opacity in the right upper lobe abutting the fissure and peripherally. Nodular areas of airspace disease in the left lower lobe, series 4, image 121. For example elongated nodular consolidation measures 4 x 1.3 cm, series 4, image 121. No pleural effusion. Upper Abdomen: Elevated right hemidiaphragm. Bilateral renal cysts need no further imaging follow-up. Excreted IV contrast in the renal collecting systems from prior head and neck CTA. Musculoskeletal: Diffuse thoracic spondylosis. There are no acute or suspicious osseous abnormalities. IMPRESSION: 1. Dependent ground-glass and consolidative opacity in the right upper lobe abutting the fissure and peripherally, suspicious for pneumonia. 2. Nodular areas of airspace disease in the left lower lobe, largest measuring 4 x 1.3 cm also suspicious for infectio; however neoplasm is not excluded. Recommend follow-up CT in  4-6 weeks after course of treatment. 3. Elevated right hemidiaphragm with adjacent airspace disease in the right lower and middle lobes, likely compressive atelectasis. 4. Ascending aortic dilatation at 4.5 cm. Recommend semi-annual imaging  followup by CTA or MRA and referral to cardiothoracic surgery if not already obtained. This recommendation follows 2010 ACCF/AHA/AATS/ACR/ASA/SCA/SCAI/SIR/STS/SVM Guidelines for the Diagnosis and Management of Patients With Thoracic Aortic Disease. Circulation. 2010; 121: [id number]. Aortic aneurysm NOS (ICD10-I71.9) Aortic Atherosclerosis (ICD10-I70.0) and Emphysema (ICD10-J43.9). Electronically Signed   By: Andrea Gasman M.D.   On: 12/22/2023 22:03   CT ANGIO HEAD NECK W WO CM (CODE STROKE) Addendum Date: 12/22/2023 ADDENDUM REPORT: 12/22/2023 19:11 ADDENDUM: CTA head impression #1 called by telephone at the time of interpretation on 12/22/2023 at 7:08 pm to provider Dr. Jakie, who verbally acknowledged these results. The progressive focus of ulcerated plaque at the distal right common carotid artery was also discussed at this time. Electronically Signed   By: Rockey Childs D.O.   On: 12/22/2023 19:11   Result Date: 12/22/2023 CLINICAL DATA:  Provided history: Neuro deficit, acute, stroke suspected. EXAM: CT ANGIOGRAPHY HEAD AND NECK WITH AND WITHOUT CONTRAST TECHNIQUE: Multidetector CT imaging of the head and neck was performed using the standard protocol during bolus administration of intravenous contrast. Multiplanar CT image reconstructions and MIPs were obtained to evaluate the vascular anatomy. Carotid stenosis measurements (when applicable) are obtained utilizing NASCET criteria, using the distal internal carotid diameter as the denominator. RADIATION DOSE REDUCTION: This exam was performed according to the departmental dose-optimization program which includes automated exposure control, adjustment of the mA and/or kV according to patient size and/or use of iterative reconstruction technique. CONTRAST:  75mL OMNIPAQUE  IOHEXOL  350 MG/ML SOLN COMPARISON:  Noncontrast head CT performed earlier today 12/22/2023. MRA head 05/10/2022. CT angiogram head/neck 05/10/2022. FINDINGS: CTA NECK FINDINGS Aortic  arch: Standard aortic branching. Atherosclerotic plaque within the visualized aortic arch and proximal major branch vessels of the neck. Streak/beam hardening artifact arising from a dense contrast bolus partially obscures the left subclavian artery. Within this limitation, there is no appreciable hemodynamically significant innominate or proximal subclavian artery stenosis. Right carotid system: CCA and ICA patent within the neck. Atherosclerotic plaque. Muscle the, there is an sclerotic plaque at the carotid bifurcation and within the proximal ICA resulting in less than 50% stenosis. Progressive focus of ulcerated plaque anteriorly at the distal common carotid artery, now measuring 4 mm (series 5, image 230). Partially retropharyngeal course of the cervical ICA. Left carotid system: CCA and ICA patent within the neck. Atherosclerotic plaque. Most notably, progressive (mildly calcified) plaque within the proximal ICA results in an estimated 60-70% stenosis which has progressed (series 5, images 214 and 215). Vertebral arteries: Patent within the neck. Severe stenosis at the right vertebral artery origin, unchanged. Atherosclerotic plaque within the right vertebral artery at the V3/V4 junction with up to moderate stenosis. The left vertebral artery is non dominant and developmentally diminutive, but patent throughout the neck. Atherosclerotic plaque at the left vertebral artery origin. Skeleton: The patient is edentulous. Spondylosis of the cervical and visible thoracic levels. No acute fracture or aggressive osseous lesion. Other neck: 12 mm ovoid nodule again demonstrated within the superficial lobe of the right parotid gland. This is suspicious for a primary parotid neoplasm. Upper chest: Incompletely imaged cystic or cavitary lesion within the right lower lobe (measuring at least 4.6 cm (series 5, image 344). Mild dependent atelectasis within the bilateral upper lobes and right lower lobe at the imaged levels.  Review  of the MIP images confirms the above findings CTA HEAD FINDINGS Anterior circulation: The intracranial internal carotid arteries are patent. The right MCA proximal M2 branch which was occluded on the prior CTA of 05/10/2022 is occluded more proximally on today's study. Additionally, there is a new severe stenosis within this vessel immediately upstream. Additional atherosclerotic irregularity of more distal M2 right MCA vessels. The left middle cerebral artery M1 segment is patent. No left M2 proximal branch occlusion or high-grade proximal stenosis is identified. The anterior cerebral arteries are patent. No intracranial aneurysm is identified. Posterior circulation: The intracranial vertebral arteries are patent. Progressive atherosclerotic plaque within the right vertebral artery at the V3/V4 junction now with up to moderate stenosis. The basilar artery is patent. The posterior cerebral arteries are patent. Atherosclerotic irregularity of both vessels without high-grade proximal stenosis. Fetal origin right PCA. The left posterior communicating artery is diminutive or absent. Venous sinuses: Limited assessment for dural venous sinus thrombosis due to contrast timing. Anatomic variants: As described. Review of the MIP images confirms the above findings Attempts are being made to reach the ordering provider at this time. IMPRESSION: CTA neck: 1. The common carotid and internal carotid arteries are patent within the neck. Atherosclerotic plaque bilaterally, as described. Progressive atherosclerotic plaque within the proximal left internal carotid artery now with an estimated 60-70% stenosis at this site. Progressive focus of ulcerated plaque anteriorly at the distal right common carotid artery. 2. The right vertebral artery is dominant and patent within the neck. Unchanged severe atherosclerotic narrowing at the right vertebral artery origin. Progressive atherosclerotic plaque within the right vertebral  artery at the V3/V4 junction resulting in up to moderate stenosis. 3. The left vertebral artery is non-dominant and developmentally diminutive, but patent throughout the neck. 4. Aortic Atherosclerosis (ICD10-I70.0). 5. Partially imaged cystic or cavitary pulmonary lesion within the right lower lobe. A dedicated chest CT is recommended for further characterization. 6. Unchanged 12 mm right parotid gland nodule suspicious for a primary parotid neoplasm. ENT follow-up recommended. CTA head: 1. The right middle cerebral artery proximal M2 branch which was occluded on the prior CTA of 05/10/2022 is occluded more proximally on today's study. Additionally, there is a new severe stenosis within this vessel immediately upstream. 2. No proximal intracranial large vessel occlusion identified elsewhere. 3. Background intracranial atherosclerotic disease as described. Electronically Signed: By: Rockey Childs D.O. On: 12/22/2023 19:06   DG Chest Portable 1 View Result Date: 12/22/2023 CLINICAL DATA:  Shortness of breath EXAM: PORTABLE CHEST - 1 VIEW COMPARISON:  July 04, 2013 FINDINGS: Low lung volumes with bronchovascular crowding. Elevation of the right hemidiaphragm with streaky right basilar atelectasis. Biapical pleural thickening. No pneumothorax or pleural effusion. Mild cardiomegaly.Cardiac loop recorder device.No acute fracture or destructive lesion. Multilevel thoracic osteophytosis. IMPRESSION: No acute cardiopulmonary abnormality. Electronically Signed   By: Rogelia Myers M.D.   On: 12/22/2023 18:49   CT HEAD CODE STROKE WO CONTRAST Result Date: 12/22/2023 CLINICAL DATA:  Code stroke. Neuro deficit, acute, stroke suspected. Dysarthria. Right facial droop. EXAM: CT HEAD WITHOUT CONTRAST TECHNIQUE: Contiguous axial images were obtained from the base of the skull through the vertex without intravenous contrast. RADIATION DOSE REDUCTION: This exam was performed according to the departmental dose-optimization  program which includes automated exposure control, adjustment of the mA and/or kV according to patient size and/or use of iterative reconstruction technique. COMPARISON:  Head CT 05/16/2022. FINDINGS: Brain: Generalized cerebral atrophy. Large chronic cortical/subcortical right MCA/PCA territory infarct, increased in extent as compared to  the prior head CT of 05/16/2022. Moderate-sized cortical/subcortical infarct within the mid left frontal lobe (MCA territory), new from the prior CT but chronic in appearance. Redemonstrated chronic cortical/subcortical infarct within the left parietal lobe (MCA territory). Unchanged chronic lacunar infarct within/about the right basal ganglia. Unchanged chronic lacunar infarct within the left thalamus. An infarct within the right thalamus is new from the prior head CT, however, there is associated volume loss at this site and this appears chronic. Background patchy and ill-defined hypoattenuation within the cerebral white matter, nonspecific but compatible with chronic small vessel ischemic disease. Chronic infarcts within the bilateral cerebellar hemispheres, not appreciably changed. There is no acute intracranial hemorrhage. No acute demarcated cortical infarct is identified. No extra-axial fluid collection. No evidence of an intracranial mass. No midline shift. Vascular: No hyperdense vessel.  Atherosclerotic calcifications. Skull: No calvarial fracture or aggressive osseous lesion. Sinuses/Orbits: No mass or acute finding within the imaged orbits. No significant paranasal sinus disease at the imaged levels. Other: Small left mastoid effusion. Associated chronic sclerotic changes within left mastoid air cells. ASPECTS [facility] Stroke Program Early CT Score) - Ganglionic level infarction (caudate, lentiform nuclei, internal capsule, insula, M1-M3 cortex): 7 - Supraganglionic infarction (M4-M6 cortex): 3 Total score (0-10 with 10 being normal): 10 No evidence of an acute  intracranial abnormality. These results were communicated to Dr. Merrianne at 6:26 pmon 10/4/2025by text page via the [facility] messaging system. IMPRESSION: 1. No evidence of an acute intracranial abnormality. 2. Parenchymal atrophy, chronic small vessel ischemic disease and chronic infarcts, as described. 3. Small left mastoid effusion. Associated chronic sclerotic changes within left mastoid air cells. Electronically Signed   By: Rockey Childs D.O.   On: 12/22/2023 18:27    Vitals:   12/23/23 0150 12/23/23 0415 12/23/23 0500 12/23/23 0620  BP: (!) 145/76 133/73 130/64 (!) 141/82  Pulse: 78 83 91 88  Resp: 13  20 (!) 28  Temp: (!) 96.9 F (36.1 C) 97.8 F (36.6 C)    TempSrc: Axillary Oral    SpO2: 93% 94% 97% 94%  Weight:      Height:         PHYSICAL EXAM General:  Alert, well-nourished, well-developed patient in no acute distress Psych:  Mood and affect appropriate for situation CV: Regular rate and rhythm on monitor Respiratory:  Regular, unlabored respirations on room air GI: Abdomen soft and nontender   NEURO:  Mental Status: AA&Ox3, patient is able to give clear and coherent history Speech/Language: speech is without  aphasia.  Dysarthric speech, some hesitancy with speech naming, repetition, fluency, and comprehension intact.  Cranial Nerves:  II: PERRL. Visual fields full.  III, IV, VI: EOMI. Eyelids elevate symmetrically.  V: Sensation is intact to light touch and symmetrical to face.  VII: Right facial droop VIII: hearing intact to voice. IX, X: Palate elevates symmetrically. Phonation is normal.  KP:Dynloizm shrug 5/5. XII: tongue is midline without fasciculations. Motor: 5/5 strength to all muscle groups tested.  Tone: is normal and bulk is normal Sensation- Intact to light touch bilaterally. Extinction absent to light touch to DSS.   Coordination: FTN intact bilaterally, HKS: no ataxia in BLE.No drift.  Gait- deferred  Most Recent NIH  3   ASSESSMENT/PLAN  Edward Maynard is a 89 y.o. male with history of atrial fibrillation on Eliquis , HLD, right MCA stroke s/p thrombectomy with reocclusion and residual left-sided weakness, who presents to the ED with new onset of right facial droop, dysarthria with difficulty speaking. At  4:50 PM he was doing well and then was noticed by staff at his facility ([name]) to have a change in his speech and right-sided facial droop. No headache. Has had some mild shortness of breath recently.  NIH on Admission 10  Acute Ischemic Infarct:  bilateral left precentral gyrus and right cerebellar  Etiology: Cardioembolic from A-fib despite anticoagulation with Eliquis  Code Stroke  CT head No acute abnormality.  Small vessel disease. Atrophy.  ASPECTS 10.    chronic infarcts  CTA head & neck right middle cerebral artery proximal M2 branch which was occluded new severe stenosis within this vessel immediately upstream. MRI acute left precentral gyrus and lacunar infarct in right cerebellar hemisphere generalized volume loss and advanced periventricular and deep cerebral white matter disease 2D Echo EF 60 to 65%. LDL 78 HgbA1c 5.6 VTE prophylaxis -Eliquis  Eliquis  (apixaban ) daily prior to admission, now on Eliquis  (apixaban ) daily could consider switching to Pradaxa if he could afford it Therapy recommendations:  Pending Disposition: Pending  Hx of Stroke/TIA Prior right MCA stroke with residual left-sided weakness in 2015 History of stroke in February 24 at that time he was on Xarelto  and was switched to Eliquis   Atrial fibrillation Home Meds: Eliquis  Continue telemetry monitoring Continue anticoagulation with Eliquis   Hypertension Home meds: Norvasc  10 mg, Stable Blood Pressure Goal: SBP less than 160   Hyperlipidemia Home meds: Atorvastatin  20 mg,  resumed in hospital LDL 78, goal < 70 Increased to 80 mg Continue statin at discharge  Dysphagia Patient has post-stroke  dysphagia, SLP consulted    Diet   Diet Heart Room service appropriate? Yes; Fluid consistency: Thin   Advance diet as tolerated  Other Stroke Risk Factors Obesity, Body mass index is 31.22 kg/m., BMI >/= 30 associated with increased stroke risk, recommend weight loss, diet and exercise as appropriate    Other Active Problems Multifocal pneumonia-management per primary team Depression and anxiety CKD  Hospital day # 0  Karna Geralds DNP, ACNPC-AG  [facility]  I have personally obtained history,examined this patient, reviewed notes, independently viewed imaging studies, participated in medical decision making and plan of care.ROS completed by me personally and pertinent positives fully documented  I have made any additions or clarifications directly to the above note. Agree with note above.  He presented with sudden onset of dysarthria and facial droop due to embolic left precentral gyrus infarct likely from A-fib despite anticoagulation with Eliquis .  Discussed lack of definitive data suggesting switching Eliquis  to alternative NOAC is necessarily superior.  We will discuss his co-pay for Pradaxa and if he is willing to consider switching it.  Continue ongoing stroke workup.  Mobilize out of bed.  Therapy consults.  Long discussion with patient and answered questions.   I personally spent a total of 50 minutes in the care of the patient today including getting/reviewing separately obtained history, performing a medically appropriate exam/evaluation, counseling and educating, placing orders, referring and communicating with other health care professionals, documenting clinical information in the EHR, independently interpreting results, and coordinating care.        Eather Popp, MD Medical Director [facility] Pager: [phone] 12/23/2023 4:39 PM  To contact Stroke Continuity provider, please refer to [email or web address]. After hours, contact General Neurology

## 2023-12-23 NOTE — Evaluation (Signed)
 Occupational Therapy Evaluation Patient Details Name: Edward Maynard MRN: [id number] DOB: 10-24-1933 Today's Date: 12/23/2023   History of Present Illness   Pt is an 89 y.o. male who presented 12/22/23 with R facial droop, and dysarthria with difficulty speaking. MRI wtih acute cortical infarct in L precentral gyrus and acute lacunar infarcts in R cerebellar hemisphere. PMH: A-fib on Xarelto , HLD, right MCA stroke s/p thrombectomy with reocclusion and residual left-sided weakness     Clinical Impressions Pt has assist at baseline from [name] staff for ADLs, uses stedy for transfers and in shower for bathing, pt uses feet to shuffle to propel manual w/c. Pt currently needs up to max A for ADLs, max A for bed mobility. Pt with heavy posterior lean, unable to sit EOB, with audible SOB and tachycardic to mid 140s with a-fib rhythm. Pt with LUE deficits from prior CVA, and RUE RTC injury limiting shoulder ROM to perform ADLs. Pt presenting with impairments listed below, will follow acutely. Recommend return to ALF at d/c with [facility] services.     If plan is discharge home, recommend the following:   Two people to help with walking and/or transfers;A lot of help with bathing/dressing/bathroom;Assistance with cooking/housework;Direct supervision/assist for medications management;Direct supervision/assist for financial management;Assist for transportation;Help with stairs or ramp for entrance     Functional Status Assessment   Patient has had a recent decline in their functional status and demonstrates the ability to make significant improvements in function in a reasonable and predictable amount of time.     Equipment Recommendations   Other (comment) (defer)     Recommendations for Other Services   PT consult     Precautions/Restrictions   Precautions Precautions: Fall Precaution/Restrictions Comments: L hemi baseline Restrictions Weight Bearing Restrictions Per  Provider Order: No     Mobility Bed Mobility Overal bed mobility: Needs Assistance Bed Mobility: Supine to Sit, Sit to Supine     Supine to sit: Max assist Sit to supine: Max assist   General bed mobility comments: unable to sustain balance at EOB and unable to get feet to floor as pt kept bringing LLE back into bed    Transfers                   General transfer comment: deferred      Balance Overall balance assessment: Needs assistance   Sitting balance-Leahy Scale: Poor   Postural control: Posterior lean                                 ADL either performed or assessed with clinical judgement   ADL Overall ADL's : Needs assistance/impaired Eating/Feeding: Minimal assistance   Grooming: Minimal assistance   Upper Body Bathing: Moderate assistance   Lower Body Bathing: Maximal assistance   Upper Body Dressing : Moderate assistance   Lower Body Dressing: Maximal assistance   Toilet Transfer: Maximal assistance           Functional mobility during ADLs: Maximal assistance       Vision   Additional Comments: will further assess, noted keeping R eye closed at times, incr time to read time on clock     Perception Perception: Not tested       Praxis Praxis: Not tested       Pertinent Vitals/Pain Pain Assessment Pain Assessment: No/denies pain     Extremity/Trunk Assessment Upper Extremity Assessment Upper Extremity Assessment: Right hand  dominant;LUE deficits/detail;RUE deficits/detail RUE Deficits / Details: limited active shoulder ROM to 90* due to RTC tear RUE Coordination: decreased gross motor LUE Deficits / Details: deficits from prior CVA, AROM to 90* on shoulder, decr coordination but can flex/ext elbow, wrist WNL but keeps in resting flexion, limited digit extension, pt has splint for intermittent daytime use but is at [name] LUE Coordination: decreased fine motor;decreased gross motor   Lower Extremity  Assessment Lower Extremity Assessment: Defer to PT evaluation   Cervical / Trunk Assessment Cervical / Trunk Assessment: Kyphotic   Communication Communication Communication: Impaired Factors Affecting Communication: Reduced clarity of speech;Difficulty expressing self;Hearing impaired   Cognition Arousal: Alert Behavior During Therapy: WFL for tasks assessed/performed Cognition: Cognition impaired   Orientation impairments: Situation (incr time to identify place)                           Following commands: Impaired Following commands impaired: Follows multi-step commands with increased time     Cueing  General Comments   Cueing Techniques: Verbal cues;Tactile cues  A fib rhythm with HR up to mid 140s   Exercises     Shoulder Instructions      Home Living Family/patient expects to be discharged to:: Assisted living ([facility])                             Home Equipment: Wheelchair - Manufacturing systems engineer;Other (comment) (stedy)   Additional Comments: staff can provide 24/7 care      Prior Functioning/Environment Prior Level of Function : Needs assist             Mobility Comments: Needs assistance using stedy to transfer to/from w/c. Uses legs to push himself around in w/c short distances. Intermittently stands with RW with assist. Has not walked in > 1 year ADLs Comments: Can feed himself, but needs help to prepare the food by cutting it up as needed. Needs assistance for bathing, dressing, and toileting. Usually wash in stedy    OT Problem List: Decreased strength;Decreased range of motion;Decreased activity tolerance;Impaired balance (sitting and/or standing);Impaired vision/perception;Decreased safety awareness;Impaired UE functional use   OT Treatment/Interventions: Self-care/ADL training;Therapeutic exercise;Energy conservation;DME and/or AE instruction;Therapeutic activities;Patient/family education;Balance training       OT Goals(Current goals can be found in the care plan section)   Acute Rehab OT Goals Patient Stated Goal: did not state OT Goal Formulation: With patient Time For Goal Achievement: 01/06/24 Potential to Achieve Goals: Good ADL Goals Pt Will Perform Upper Body Dressing: sitting;with min assist Pt Will Perform Lower Body Dressing: with mod assist;sitting/lateral leans;sit to/from stand Pt Will Transfer to Toilet: stand pivot transfer;bedside commode;with mod assist Pt/caregiver will Perform Home Exercise Program: Increased strength;Increased ROM;Both right and left upper extremity;With written HEP provided;With minimal assist Additional ADL Goal #1: pt will demonstrate good sitting balance in prep for ADLs   OT Frequency:  Min 2X/week    Co-evaluation              AM-PAC OT 6 Clicks Daily Activity     Outcome Measure Help from another person eating meals?: A Little Help from another person taking care of personal grooming?: A Little Help from another person toileting, which includes using toliet, bedpan, or urinal?: A Lot Help from another person bathing (including washing, rinsing, drying)?: A Lot Help from another person to put on and taking off regular upper body clothing?:  A Lot Help from another person to put on and taking off regular lower body clothing?: A Lot 6 Click Score: 14   End of Session Nurse Communication: Mobility status  Activity Tolerance: Patient tolerated treatment well Patient left: in bed;with call bell/phone within reach;with family/visitor present  OT Visit Diagnosis: Unsteadiness on feet (R26.81);Other abnormalities of gait and mobility (R26.89);Muscle weakness (generalized) (M62.81)                Time: 8584-8555 OT Time Calculation (min): 29 min Charges:  OT General Charges $OT Visit: 1 Visit OT Evaluation $OT Eval Moderate Complexity: 1 Mod OT Treatments $Therapeutic Activity: 8-22 mins  Jeanmarie Mccowen K, OTD, OTR/L SecureChat  Preferred Acute Rehab [phone]   Laneta POUR Koonce 12/23/2023, 3:35 PM

## 2023-12-23 NOTE — Progress Notes (Addendum)
 PROGRESS NOTE    MANNY VITOLO  FMW:[id number] DOB: 12/19/1933 DOA: 12/22/2023 PCP: Sherre Rea, FNP     Brief Narrative:  JARED WHORLEY is a 89 y.o. male with medical history significant for hypertension, hyperlipidemia, history of CVA with residual left-sided deficits, CKD 2, atrial fibrillation on Eliquis , depression, and anxiety, now presenting with speech difficulty and right facial droop.   He has history of stroke from February 2024.  At that time, he was diagnosed with right middle cerebral artery stroke.  Xarelto  was switched to Eliquis  at that time.  He was discharged to [facility] after hospital stay.  He currently resides in an ALF.  Patient was in his usual state of health until yesterday evening when he developed dysarthria, right facial droop.  He was transferred to the hospital for stroke workup.  New events last 24 hours / Subjective: Patient seen in the emergency department, family at bedside.  He denies missing his Eliquis  dosing.  Admits to dysarthric speech.  Was unaware about his right-sided facial droop.  Has some chronic left-sided deficits from stroke in 2024.  Also notes some respiratory symptoms that have been ongoing for couple of weeks.  Mainly dry cough and intermittent shortness of breath.  He has a former smoker, smoked for about 10 years in his 83s.  Assessment & Plan:   Principal Problem:   Acute focal neurological deficit Active Problems:   Hyperlipidemia   Paroxysmal atrial fibrillation (HCC)   History of stroke with current residual effects   Chronic a-fib (HCC)   CKD (chronic kidney disease)   CVA (cerebral vascular accident) (HCC)   Mass of right parotid gland   Pulmonary lesion, right   Depression   Acute left precentral gyrus and acute lacunar infarct right cerebral hemisphere  - History of right MCA CVA with residual left sided deficits - Echocardiogram EF 60 to 65% - Stroke team following - PT recommending home health on discharge -  Eliquis   Multifocal pneumonia - CT chest showed right upper lobe and left lower lobe consolidation.  Started Rocephin - Recommended outpatient repeat imaging in 4-6 weeks to ensure resolution of consolidation  A-fib RVR - Has history of paroxysmal A-fib.  On Eliquis  at baseline - Developed A-fib RVR in the emergency department, rate 130s.  Started Cardizem  gtt  Hyperlipidemia - Lipitor, increase dose  Depression/anxiety - Prozac, Remeron , trazodone   CKD stage IIIa - Monitor  Right parotid lesion - Follow-up outpatient  Ascending aortic dilatation at 4.5 cm - Recommend semi-annual imaging followup by CTA or MRA    DVT prophylaxis:  apixaban  (ELIQUIS ) tablet 5 mg  Code Status: DNR Family Communication: At bedside Disposition Plan: ALF with home health Status is: Observation The patient will require care spanning > 2 midnights and should be moved to inpatient because: Stroke workup, A-fib RVR    Antimicrobials:  Anti-infectives (From admission, onward)    Start     Dose/Rate Route Frequency Ordered Stop   12/23/23 0830  cefTRIAXone (ROCEPHIN) 2 g in sodium chloride  0.9 % 100 mL IVPB        2 g 200 mL/hr over 30 Minutes Intravenous Every 24 hours 12/23/23 0822          Objective: Vitals:   12/23/23 1130 12/23/23 1145 12/23/23 1200 12/23/23 1215  BP: 106/73     Pulse: (!) 135 (!) 133 (!) 131 (!) 132  Resp: 19 20 16 19   Temp: 98.2 F (36.8 C)     TempSrc: Oral  SpO2: 93% 94% 94% 94%  Weight:      Height:        Intake/Output Summary (Last 24 hours) at 12/23/2023 1318 Last data filed at 12/23/2023 0950 Gross per 24 hour  Intake 442.22 ml  Output --  Net 442.22 ml   Filed Weights   12/22/23 1700 12/22/23 1810  Weight: 104.4 kg 104.4 kg    Examination:  General exam: Appears calm and comfortable  Respiratory system: Clear to auscultation. Respiratory effort normal. No respiratory distress. No conversational dyspnea.  On room air Cardiovascular  system: S1 & S2 heard, tachycardic rate 130 Gastrointestinal system: Abdomen is nondistended, soft and nontender. Normal bowel sounds heard. Central nervous system: Alert and oriented. + Dysarthric speech, right lower facial droop.  Strength 5/5 all extremities, but diminished on left upper extremity which is chronic in nature Extremities: Symmetric in appearance  Psychiatry: Judgement and insight appear normal. Mood & affect appropriate.   Data Reviewed: I have personally reviewed following labs and imaging studies  CBC: Recent Labs  Lab 12/22/23 1745 12/22/23 1752 12/22/23 1826 12/23/23 0413  WBC  --  7.4  --  5.9  NEUTROABS  --  3.9  --   --   HGB 15.0 14.8 13.3 14.3  HCT 44.0 43.6 39.0 42.2  MCV  --  94.2  --  93.0  PLT  --  176  --  163   Basic Metabolic Panel: Recent Labs  Lab 12/22/23 1745 12/22/23 1752 12/22/23 1826 12/23/23 0413  NA 139 136 139 138  K 3.6 3.4* 3.4* 4.1  CL 104 102  --  104  CO2  --  23  --  20*  GLUCOSE 197* 187*  --  206*  BUN 21 19  --  18  CREATININE 1.30* 1.27*  --  1.32*  CALCIUM   --  8.8*  --  9.0   GFR: Estimated Creatinine Clearance: 49.2 mL/min (A) (by C-G formula based on SCr of 1.32 mg/dL (H)). Liver Function Tests: Recent Labs  Lab 12/22/23 1752  AST 20  ALT 17  ALKPHOS 145*  BILITOT 0.6  PROT 7.0  ALBUMIN 3.7   No results for input(s): LIPASE, AMYLASE in the last 168 hours. No results for input(s): AMMONIA in the last 168 hours. Coagulation Profile: Recent Labs  Lab 12/22/23 1752  INR 1.2   Cardiac Enzymes: No results for input(s): CKTOTAL, CKMB, CKMBINDEX, TROPONINI in the last 168 hours. BNP (last 3 results) No results for input(s): PROBNP in the last 8760 hours. HbA1C: Recent Labs    12/23/23 0413  HGBA1C 5.6   CBG: Recent Labs  Lab 12/22/23 1742  GLUCAP 197*   Lipid Profile: Recent Labs    12/23/23 0413  CHOL 127  HDL 43  LDLCALC 78  TRIG 29  CHOLHDL 3.0   Thyroid  Function  Tests: No results for input(s): TSH, T4TOTAL, FREET4, T3FREE, THYROIDAB in the last 72 hours. Anemia Panel: No results for input(s): VITAMINB12, FOLATE, FERRITIN, TIBC, IRON, RETICCTPCT in the last 72 hours. Sepsis Labs: No results for input(s): PROCALCITON, LATICACIDVEN in the last 168 hours.  Recent Results (from the past 240 hours)  Resp panel by RT-PCR (RSV, Flu A&B, Covid) Anterior Nasal Swab     Status: None   Collection Time: 12/22/23  5:55 PM   Specimen: Anterior Nasal Swab  Result Value Ref Range Status   SARS Coronavirus 2 by RT PCR NEGATIVE NEGATIVE Final   Influenza A by PCR NEGATIVE NEGATIVE Final  Influenza B by PCR NEGATIVE NEGATIVE Final    Comment: (NOTE) The Xpert Xpress SARS-CoV-2/FLU/RSV plus assay is intended as an aid in the diagnosis of influenza from Nasopharyngeal swab specimens and should not be used as a sole basis for treatment. Nasal washings and aspirates are unacceptable for Xpert Xpress SARS-CoV-2/FLU/RSV testing.  Fact Sheet for Patients: BloggerCourse.com  Fact Sheet for Healthcare Providers: SeriousBroker.it  This test is not yet approved or cleared by the [place]  FDA and has been authorized for detection and/or diagnosis of SARS-CoV-2 by FDA under an Emergency Use Authorization (EUA). This EUA will remain in effect (meaning this test can be used) for the duration of the COVID-19 declaration under Section 564(b)(1) of the Act, 21 U.S.C. section 360bbb-3(b)(1), unless the authorization is terminated or revoked.     Resp Syncytial Virus by PCR NEGATIVE NEGATIVE Final    Comment: (NOTE) Fact Sheet for Patients: BloggerCourse.com  Fact Sheet for Healthcare Providers: SeriousBroker.it  This test is not yet approved or cleared by the [place]  FDA and has been authorized for detection and/or diagnosis of  SARS-CoV-2 by FDA under an Emergency Use Authorization (EUA). This EUA will remain in effect (meaning this test can be used) for the duration of the COVID-19 declaration under Section 564(b)(1) of the Act, 21 U.S.C. section 360bbb-3(b)(1), unless the authorization is terminated or revoked.  Performed at [facility], [address], [place], [place] [address]       Radiology Studies: ECHOCARDIOGRAM COMPLETE Result Date: 12/23/2023    ECHOCARDIOGRAM REPORT   Patient Name:   KAYSIN BROCK Date of Exam: 12/23/2023 Medical Rec #:  [id number]     Height:       72.0 in Accession #:    [id number]    Weight:       230.2 lb Date of Birth:  03-Sep-1933    BSA:          2.262 m Patient Age:    89 years      BP:           138/72 mmHg Patient Gender: M             HR:           82 bpm. Exam Location:  Inpatient Procedure: 2D Echo (Both Spectral and Color Flow Doppler were utilized during            procedure). Indications:    stroke  History:        Patient has prior history of Echocardiogram examinations, most                 recent 05/10/2022. Chronic kidney disease, Arrythmias:Atrial                 Fibrillation and PVC; Risk Factors:Dyslipidemia.  Sonographer:    Tinnie Barefoot RDCS Referring Phys: [id number] TIMOTHY S OPYD IMPRESSIONS  1. Left ventricular ejection fraction, by estimation, is 60 to 65%. The left ventricle has normal function. The left ventricle has no regional wall motion abnormalities. Left ventricular diastolic parameters were normal.  2. Right ventricular systolic function is normal. The right ventricular size is normal.  3. The mitral valve is normal in structure. Trivial mitral valve regurgitation. No evidence of mitral stenosis.  4. DVI 0.31. The aortic valve is normal in structure. Aortic valve regurgitation is mild. Moderate aortic valve stenosis. Aortic valve area, by VTI measures 1.08 cm. Aortic valve mean gradient measures 24.0 mmHg. Aortic valve Vmax measures 3.17  m/s.  5. The  inferior vena cava not well visualized. FINDINGS  Left Ventricle: Left ventricular ejection fraction, by estimation, is 60 to 65%. The left ventricle has normal function. The left ventricle has no regional wall motion abnormalities. The left ventricular internal cavity size was normal in size. There is  no left ventricular hypertrophy. Left ventricular diastolic parameters were normal. Right Ventricle: The right ventricular size is normal. No increase in right ventricular wall thickness. Right ventricular systolic function is normal. Left Atrium: Left atrial size was normal in size. Right Atrium: Right atrial size was normal in size. Pericardium: There is no evidence of pericardial effusion. Mitral Valve: The mitral valve is normal in structure. Trivial mitral valve regurgitation. No evidence of mitral valve stenosis. Tricuspid Valve: The tricuspid valve is normal in structure. Tricuspid valve regurgitation is not demonstrated. No evidence of tricuspid stenosis. Aortic Valve: DVI 0.31. The aortic valve is normal in structure. Aortic valve regurgitation is mild. Aortic regurgitation PHT measures 278 msec. Moderate aortic stenosis is present. Aortic valve mean gradient measures 24.0 mmHg. Aortic valve peak gradient measures 40.2 mmHg. Aortic valve area, by VTI measures 1.08 cm. Pulmonic Valve: The pulmonic valve was not well visualized. Pulmonic valve regurgitation is not visualized. No evidence of pulmonic stenosis. Aorta: Aortic root could not be assessed. Venous: The inferior vena cava not well visualized. IAS/Shunts: No atrial level shunt detected by color flow Doppler.  LEFT VENTRICLE PLAX 2D LVIDd:         5.80 cm   Diastology LVIDs:         3.80 cm   LV e' medial:    5.55 cm/s LV PW:         1.20 cm   LV E/e' medial:  14.4 LV IVS:        1.40 cm   LV e' lateral:   11.50 cm/s LVOT diam:     2.10 cm   LV E/e' lateral: 6.9 LV SV:         70 LV SV Index:   31 LVOT Area:     3.46 cm  RIGHT VENTRICLE RV Basal diam:   3.20 cm RV S prime:     13.30 cm/s TAPSE (M-mode): 2.4 cm LEFT ATRIUM             Index        RIGHT ATRIUM           Index LA diam:        5.40 cm 2.39 cm/m   RA Area:     14.00 cm LA Vol (A2C):   65.4 ml 28.92 ml/m  RA Volume:   35.10 ml  15.52 ml/m LA Vol (A4C):   60.4 ml 26.71 ml/m LA Biplane Vol: 63.1 ml 27.90 ml/m  AORTIC VALVE AV Area (Vmax):    1.09 cm AV Area (Vmean):   1.03 cm AV Area (VTI):     1.08 cm AV Vmax:           317.00 cm/s AV Vmean:          228.000 cm/s AV VTI:            0.654 m AV Peak Grad:      40.2 mmHg AV Mean Grad:      24.0 mmHg LVOT Vmax:         100.00 cm/s LVOT Vmean:        68.000 cm/s LVOT VTI:  0.203 m LVOT/AV VTI ratio: 0.31 AI PHT:            278 msec  AORTA Ao Root diam: 3.20 cm MR Peak grad: 144.5 mmHg MR Mean grad: 87.0 mmHg    SHUNTS MR Vmax:      601.00 cm/s  Systemic VTI:  0.20 m MR Vmean:     432.0 cm/s   Systemic Diam: 2.10 cm MV E velocity: 79.80 cm/s MV A velocity: 81.90 cm/s MV E/A ratio:  0.97 Franck Azobou Tonleu Electronically signed by Joelle Cedars Tonleu Signature Date/Time: 12/23/2023/12:59:02 PM    Final    MR BRAIN WO CONTRAST Result Date: 12/23/2023 EXAM: MRI BRAIN WITHOUT CONTRAST 12/23/2023 05:36:44 AM TECHNIQUE: Multiplanar multisequence MRI of the head/brain was performed without the administration of intravenous contrast. COMPARISON: MRI of the head dated 05/10/2022. CLINICAL HISTORY: Neuro deficit, acute, stroke suspected. FINDINGS: BRAIN AND VENTRICLES: Acute cortical infarct present within the cortex of the left precentral gyrus seen on image 86 of series 5. Acute lacunar infarct within the right cerebellar hemisphere seen on image 62. There are chronic encephalomalacia changes within the right parietal, temporal, and occipital lobes secondary to the infarct noted on the previous study. There are also chronic encephalomalacia changes within the frontal lobes bilaterally. There is moderate generalized cerebral volume loss and  advanced periventricular and deep cerebral white matter disease. No intracranial hemorrhage. No mass. No midline shift. No hydrocephalus. The sella is unremarkable. Normal flow voids. ORBITS: No acute abnormality. SINUSES AND MASTOIDS: No acute abnormality. BONES AND SOFT TISSUES: Normal marrow signal. No acute soft tissue abnormality. IMPRESSION: 1. Acute cortical infarct in the left precentral gyrus and acute lacunar infarct in the right cerebellar hemisphere. 2. Chronic encephalomalacia in the right parietal, temporal, and occipital lobes, and in the frontal lobes bilaterally. 3. Moderate generalized cerebral volume loss and advanced periventricular and deep cerebral white matter disease. Electronically signed by: Evalene Coho MD 12/23/2023 10:25 AM EDT RP Workstation: [id number]   CT CHEST W CONTRAST Result Date: 12/22/2023 CLINICAL DATA:  Provided history: Respiratory illness, nondiagnostic xray EXAM: CT CHEST WITH CONTRAST TECHNIQUE: Multidetector CT imaging of the chest was performed during intravenous contrast administration. RADIATION DOSE REDUCTION: This exam was performed according to the departmental dose-optimization program which includes automated exposure control, adjustment of the mA and/or kV according to patient size and/or use of iterative reconstruction technique. CONTRAST:  75mL OMNIPAQUE  IOHEXOL  350 MG/ML SOLN COMPARISON:  Radiograph earlier today FINDINGS: Cardiovascular: The heart is normal in size. No pericardial effusion. Coronary artery calcifications. Irregular plaque throughout the thoracic aorta. The ascending aorta is dilated at 4.5 cm. No dissection or acute aortic finding. No central pulmonary embolus on this non dedicated exam. Mediastinum/Nodes: Shotty mediastinal and hilar lymph nodes, all subcentimeter short axis. Decompressed esophagus. No thyroid  nodule. Lungs/Pleura: Elevated right hemidiaphragm. Adjacent airspace disease in the right lower and middle lobe likely  compressive atelectasis. Moderate emphysema. Dependent ground-glass and consolidative opacity in the right upper lobe abutting the fissure and peripherally. Nodular areas of airspace disease in the left lower lobe, series 4, image 121. For example elongated nodular consolidation measures 4 x 1.3 cm, series 4, image 121. No pleural effusion. Upper Abdomen: Elevated right hemidiaphragm. Bilateral renal cysts need no further imaging follow-up. Excreted IV contrast in the renal collecting systems from prior head and neck CTA. Musculoskeletal: Diffuse thoracic spondylosis. There are no acute or suspicious osseous abnormalities. IMPRESSION: 1. Dependent ground-glass and consolidative opacity in the right upper lobe abutting the fissure  and peripherally, suspicious for pneumonia. 2. Nodular areas of airspace disease in the left lower lobe, largest measuring 4 x 1.3 cm also suspicious for infectio; however neoplasm is not excluded. Recommend follow-up CT in 4-6 weeks after course of treatment. 3. Elevated right hemidiaphragm with adjacent airspace disease in the right lower and middle lobes, likely compressive atelectasis. 4. Ascending aortic dilatation at 4.5 cm. Recommend semi-annual imaging followup by CTA or MRA and referral to cardiothoracic surgery if not already obtained. This recommendation follows 2010 ACCF/AHA/AATS/ACR/ASA/SCA/SCAI/SIR/STS/SVM Guidelines for the Diagnosis and Management of Patients With Thoracic Aortic Disease. Circulation. 2010; 121: Z733-z630. Aortic aneurysm NOS (ICD10-I71.9) Aortic Atherosclerosis (ICD10-I70.0) and Emphysema (ICD10-J43.9). Electronically Signed   By: Andrea Gasman M.D.   On: 12/22/2023 22:03   CT ANGIO HEAD NECK W WO CM (CODE STROKE) Addendum Date: 12/22/2023 ADDENDUM REPORT: 12/22/2023 19:11 ADDENDUM: CTA head impression #1 called by telephone at the time of interpretation on 12/22/2023 at 7:08 pm to provider Dr. Jakie, who verbally acknowledged these results. The  progressive focus of ulcerated plaque at the distal right common carotid artery was also discussed at this time. Electronically Signed   By: Rockey Childs D.O.   On: 12/22/2023 19:11   Result Date: 12/22/2023 CLINICAL DATA:  Provided history: Neuro deficit, acute, stroke suspected. EXAM: CT ANGIOGRAPHY HEAD AND NECK WITH AND WITHOUT CONTRAST TECHNIQUE: Multidetector CT imaging of the head and neck was performed using the standard protocol during bolus administration of intravenous contrast. Multiplanar CT image reconstructions and MIPs were obtained to evaluate the vascular anatomy. Carotid stenosis measurements (when applicable) are obtained utilizing NASCET criteria, using the distal internal carotid diameter as the denominator. RADIATION DOSE REDUCTION: This exam was performed according to the departmental dose-optimization program which includes automated exposure control, adjustment of the mA and/or kV according to patient size and/or use of iterative reconstruction technique. CONTRAST:  75mL OMNIPAQUE  IOHEXOL  350 MG/ML SOLN COMPARISON:  Noncontrast head CT performed earlier today 12/22/2023. MRA head 05/10/2022. CT angiogram head/neck 05/10/2022. FINDINGS: CTA NECK FINDINGS Aortic arch: Standard aortic branching. Atherosclerotic plaque within the visualized aortic arch and proximal major branch vessels of the neck. Streak/beam hardening artifact arising from a dense contrast bolus partially obscures the left subclavian artery. Within this limitation, there is no appreciable hemodynamically significant innominate or proximal subclavian artery stenosis. Right carotid system: CCA and ICA patent within the neck. Atherosclerotic plaque. Muscle the, there is an sclerotic plaque at the carotid bifurcation and within the proximal ICA resulting in less than 50% stenosis. Progressive focus of ulcerated plaque anteriorly at the distal common carotid artery, now measuring 4 mm (series 5, image 230). Partially  retropharyngeal course of the cervical ICA. Left carotid system: CCA and ICA patent within the neck. Atherosclerotic plaque. Most notably, progressive (mildly calcified) plaque within the proximal ICA results in an estimated 60-70% stenosis which has progressed (series 5, images 214 and 215). Vertebral arteries: Patent within the neck. Severe stenosis at the right vertebral artery origin, unchanged. Atherosclerotic plaque within the right vertebral artery at the V3/V4 junction with up to moderate stenosis. The left vertebral artery is non dominant and developmentally diminutive, but patent throughout the neck. Atherosclerotic plaque at the left vertebral artery origin. Skeleton: The patient is edentulous. Spondylosis of the cervical and visible thoracic levels. No acute fracture or aggressive osseous lesion. Other neck: 12 mm ovoid nodule again demonstrated within the superficial lobe of the right parotid gland. This is suspicious for a primary parotid neoplasm. Upper chest: Incompletely imaged cystic  or cavitary lesion within the right lower lobe (measuring at least 4.6 cm (series 5, image 344). Mild dependent atelectasis within the bilateral upper lobes and right lower lobe at the imaged levels. Review of the MIP images confirms the above findings CTA HEAD FINDINGS Anterior circulation: The intracranial internal carotid arteries are patent. The right MCA proximal M2 branch which was occluded on the prior CTA of 05/10/2022 is occluded more proximally on today's study. Additionally, there is a new severe stenosis within this vessel immediately upstream. Additional atherosclerotic irregularity of more distal M2 right MCA vessels. The left middle cerebral artery M1 segment is patent. No left M2 proximal branch occlusion or high-grade proximal stenosis is identified. The anterior cerebral arteries are patent. No intracranial aneurysm is identified. Posterior circulation: The intracranial vertebral arteries are patent.  Progressive atherosclerotic plaque within the right vertebral artery at the V3/V4 junction now with up to moderate stenosis. The basilar artery is patent. The posterior cerebral arteries are patent. Atherosclerotic irregularity of both vessels without high-grade proximal stenosis. Fetal origin right PCA. The left posterior communicating artery is diminutive or absent. Venous sinuses: Limited assessment for dural venous sinus thrombosis due to contrast timing. Anatomic variants: As described. Review of the MIP images confirms the above findings Attempts are being made to reach the ordering provider at this time. IMPRESSION: CTA neck: 1. The common carotid and internal carotid arteries are patent within the neck. Atherosclerotic plaque bilaterally, as described. Progressive atherosclerotic plaque within the proximal left internal carotid artery now with an estimated 60-70% stenosis at this site. Progressive focus of ulcerated plaque anteriorly at the distal right common carotid artery. 2. The right vertebral artery is dominant and patent within the neck. Unchanged severe atherosclerotic narrowing at the right vertebral artery origin. Progressive atherosclerotic plaque within the right vertebral artery at the V3/V4 junction resulting in up to moderate stenosis. 3. The left vertebral artery is non-dominant and developmentally diminutive, but patent throughout the neck. 4. Aortic Atherosclerosis (ICD10-I70.0). 5. Partially imaged cystic or cavitary pulmonary lesion within the right lower lobe. A dedicated chest CT is recommended for further characterization. 6. Unchanged 12 mm right parotid gland nodule suspicious for a primary parotid neoplasm. ENT follow-up recommended. CTA head: 1. The right middle cerebral artery proximal M2 branch which was occluded on the prior CTA of 05/10/2022 is occluded more proximally on today's study. Additionally, there is a new severe stenosis within this vessel immediately upstream. 2. No  proximal intracranial large vessel occlusion identified elsewhere. 3. Background intracranial atherosclerotic disease as described. Electronically Signed: By: Rockey Childs D.O. On: 12/22/2023 19:06   DG Chest Portable 1 View Result Date: 12/22/2023 CLINICAL DATA:  Shortness of breath EXAM: PORTABLE CHEST - 1 VIEW COMPARISON:  July 04, 2013 FINDINGS: Low lung volumes with bronchovascular crowding. Elevation of the right hemidiaphragm with streaky right basilar atelectasis. Biapical pleural thickening. No pneumothorax or pleural effusion. Mild cardiomegaly.Cardiac loop recorder device.No acute fracture or destructive lesion. Multilevel thoracic osteophytosis. IMPRESSION: No acute cardiopulmonary abnormality. Electronically Signed   By: Rogelia Myers M.D.   On: 12/22/2023 18:49   CT HEAD CODE STROKE WO CONTRAST Result Date: 12/22/2023 CLINICAL DATA:  Code stroke. Neuro deficit, acute, stroke suspected. Dysarthria. Right facial droop. EXAM: CT HEAD WITHOUT CONTRAST TECHNIQUE: Contiguous axial images were obtained from the base of the skull through the vertex without intravenous contrast. RADIATION DOSE REDUCTION: This exam was performed according to the departmental dose-optimization program which includes automated exposure control, adjustment of the mA and/or  kV according to patient size and/or use of iterative reconstruction technique. COMPARISON:  Head CT 05/16/2022. FINDINGS: Brain: Generalized cerebral atrophy. Large chronic cortical/subcortical right MCA/PCA territory infarct, increased in extent as compared to the prior head CT of 05/16/2022. Moderate-sized cortical/subcortical infarct within the mid left frontal lobe (MCA territory), new from the prior CT but chronic in appearance. Redemonstrated chronic cortical/subcortical infarct within the left parietal lobe (MCA territory). Unchanged chronic lacunar infarct within/about the right basal ganglia. Unchanged chronic lacunar infarct within the left  thalamus. An infarct within the right thalamus is new from the prior head CT, however, there is associated volume loss at this site and this appears chronic. Background patchy and ill-defined hypoattenuation within the cerebral white matter, nonspecific but compatible with chronic small vessel ischemic disease. Chronic infarcts within the bilateral cerebellar hemispheres, not appreciably changed. There is no acute intracranial hemorrhage. No acute demarcated cortical infarct is identified. No extra-axial fluid collection. No evidence of an intracranial mass. No midline shift. Vascular: No hyperdense vessel.  Atherosclerotic calcifications. Skull: No calvarial fracture or aggressive osseous lesion. Sinuses/Orbits: No mass or acute finding within the imaged orbits. No significant paranasal sinus disease at the imaged levels. Other: Small left mastoid effusion. Associated chronic sclerotic changes within left mastoid air cells. ASPECTS [facility] Stroke Program Early CT Score) - Ganglionic level infarction (caudate, lentiform nuclei, internal capsule, insula, M1-M3 cortex): 7 - Supraganglionic infarction (M4-M6 cortex): 3 Total score (0-10 with 10 being normal): 10 No evidence of an acute intracranial abnormality. These results were communicated to Dr. Merrianne at 6:26 pmon 10/4/2025by text page via the [facility] messaging system. IMPRESSION: 1. No evidence of an acute intracranial abnormality. 2. Parenchymal atrophy, chronic small vessel ischemic disease and chronic infarcts, as described. 3. Small left mastoid effusion. Associated chronic sclerotic changes within left mastoid air cells. Electronically Signed   By: Rockey Childs D.O.   On: 12/22/2023 18:27      Scheduled Meds:   stroke: early stages of recovery book   Does not apply Once   apixaban   5 mg Oral BID   atorvastatin   20 mg Oral Daily   diltiazem   30 mg Oral Q8H   FLUoxetine  10 mg Oral Daily   folic acid   0.5 mg Oral Daily   mirtazapine   7.5 mg Oral  QHS   traZODone   50 mg Oral QHS   Continuous Infusions:  cefTRIAXone (ROCEPHIN)  IV Stopped (12/23/23 0950)     LOS: 0 days   Time spent: 40 minutes   Delon Hoe, DO [facility] 12/23/2023, 1:18 PM   Available via Epic secure chat 7am-7pm After these hours, please refer to coverage provider listed on [email or web address]

## 2023-12-23 NOTE — ED Notes (Signed)
 Report given to [facility]

## 2023-12-23 NOTE — Progress Notes (Signed)
  Echocardiogram 2D Echocardiogram has been performed.  [name] 12/23/2023, 11:32 AM

## 2023-12-23 NOTE — Evaluation (Signed)
 Physical Therapy Evaluation Patient Details Name: Edward Maynard MRN: [id number] DOB: 13-Jun-1933 Today's Date: 12/23/2023  History of Present Illness  Pt is an 89 y.o. male who presented 12/22/23 with R facial droop, and dysarthria with difficulty speaking. Awaiting brain MRI. PMH: A-fib on Xarelto , HLD, right MCA stroke s/p thrombectomy with reocclusion and residual left-sided weakness   Clinical Impression  Pt presents with condition above and deficits mentioned below, see PT Problem List. PTA, he was living at [facility], needing assistance to transfer to/from w/c using the stedy. He could propel himself some in his w/c using his feet, but report his current w/c is heavy and difficult to propel due to this along with his residual L UE weakness and L hand ROM deficits. Thus, recommending DME below. Currently, the pt is displaying residual L-sided weakness and sensory deficits along with gross weakness, balance deficits, and endurance deficits. He is also displaying deficits in speech and cognition. He is at risk for falls, needing modA for bed mobility and CGA-minA to transfer to stand using a stedy and march while standing within the stedy. As the pt has the assistance needed at his ALF, recommend pt return there with HHPT follow-up. Will continue to follow acutely.        If plan is discharge home, recommend the following: A little help with bathing/dressing/bathroom;A little help with walking and/or transfers;Assistance with cooking/housework;Direct supervision/assist for financial management;Direct supervision/assist for medications management;Assist for transportation;Help with stairs or ramp for entrance   Can travel by private vehicle        Equipment Recommendations Wheelchair (measurements PT);Wheelchair cushion (measurements PT) (lightweight R UE one-arm drive w/c)  Recommendations for Other Services  OT consult;Speech consult    Functional Status Assessment Patient has had a  recent decline in their functional status and demonstrates the ability to make significant improvements in function in a reasonable and predictable amount of time.     Precautions / Restrictions Precautions Precautions: Fall Precaution/Restrictions Comments: L hemi baseline Restrictions Weight Bearing Restrictions Per Provider Order: No      Mobility  Bed Mobility Overal bed mobility: Needs Assistance Bed Mobility: Supine to Sit, Sit to Supine     Supine to sit: Mod assist, HOB elevated Sit to supine: Mod assist, HOB elevated   General bed mobility comments: Pt required modA to manage legs and trunk supine <> sit R edge of stretcher with pt initiating movements.    Transfers Overall transfer level: Needs assistance Equipment used: Ambulation equipment used Transfers: Sit to/from Stand Sit to Stand: Via lift equipment           General transfer comment: Pt able to pull up to stand from edge of stretcher 2x using stedy with CGA-minA for balance, needing cues to ensure L foot was placed on stedy platform flat before trying to stand. Transfer via Lift Equipment: Stedy  Ambulation/Gait Ambulation/Gait assistance: Min assist             General Gait Details: Pt marching in place in stedy with minA for balance, > 10 marches bil.  Stairs            Wheelchair Mobility     Tilt Bed    Modified Rankin (Stroke Patients Only) Modified Rankin (Stroke Patients Only) Pre-Morbid Rankin Score: Severe disability Modified Rankin: Severe disability     Balance Overall balance assessment: Needs assistance Sitting-balance support: No upper extremity supported, Feet supported Sitting balance-Leahy Scale: Fair Sitting balance - Comments: static sitting EOB  with supervision for safety   Standing balance support: Bilateral upper extremity supported, During functional activity Standing balance-Leahy Scale: Poor Standing balance comment: reliant on UE support and  intermittent minA                             Pertinent Vitals/Pain Pain Assessment Pain Assessment: Faces Faces Pain Scale: No hurt Pain Intervention(s): Monitored during session    Home Living Family/patient expects to be discharged to:: Assisted living ([name])                 Home Equipment: Wheelchair - Manufacturing systems engineer;Other (comment) (stedy) Additional Comments: staff can provide 24/7 care    Prior Function Prior Level of Function : Needs assist             Mobility Comments: Needs assistance using stedy to transfer to/from w/c. Uses legs to push himself around in w/c short distances. Intermittently stands with RW with assist. Has not walked in > 1 year ADLs Comments: Can feed himself, but needs help to prepare the food by cutting it up as needed. Needs assistance for bathing, dressing, and toileting. Usually wash in stedy     Extremity/Trunk Assessment   Upper Extremity Assessment Upper Extremity Assessment: Defer to OT evaluation;Right hand dominant    Lower Extremity Assessment Lower Extremity Assessment: LLE deficits/detail (R leg grossly >/= 4/5 throughout with pt denying any acute weakness or sensory changes) LLE Deficits / Details: hx of L leg weakness and tingling throughout; MMT scores of 4 hip flexion, 4 knee extension, 3+ ankle dorsiflexion LLE Sensation: decreased light touch    Cervical / Trunk Assessment Cervical / Trunk Assessment: Kyphotic  Communication   Communication Communication: Impaired Factors Affecting Communication: Reduced clarity of speech;Difficulty expressing self;Hearing impaired    Cognition Arousal: Alert Behavior During Therapy: WFL for tasks assessed/performed   PT - Cognitive impairments: Memory, Attention                       PT - Cognition Comments: Pt seemed to report some things not fully accurately and would perseverate on some aspects of his living situation, which daughter would  intermittently correct pt. Unsure if this is baseline or not Following commands: Impaired Following commands impaired: Follows multi-step commands with increased time     Cueing Cueing Techniques: Verbal cues, Tactile cues     General Comments General comments (skin integrity, edema, etc.): Recommended hiring someone to perform exercises and assist him with gait if cannot continue HHPT    Exercises     Assessment/Plan    PT Assessment Patient needs continued PT services  PT Problem List Decreased strength;Decreased activity tolerance;Decreased balance;Decreased mobility;Decreased cognition;Impaired sensation       PT Treatment Interventions DME instruction;Gait training;Functional mobility training;Therapeutic activities;Therapeutic exercise;Balance training;Neuromuscular re-education;Cognitive remediation;Wheelchair mobility training;Patient/family education    PT Goals (Current goals can be found in the Care Plan section)  Acute Rehab PT Goals Patient Stated Goal: to improve and walk PT Goal Formulation: With patient/family Time For Goal Achievement: 01/06/24 Potential to Achieve Goals: Good    Frequency Min 1X/week     Co-evaluation               AM-PAC PT 6 Clicks Mobility  Outcome Measure Help needed turning from your back to your side while in a flat bed without using bedrails?: A Lot Help needed moving from lying on your back to sitting on  the side of a flat bed without using bedrails?: A Lot Help needed moving to and from a bed to a chair (including a wheelchair)?: A Lot Help needed standing up from a chair using your arms (e.g., wheelchair or bedside chair)?: A Little Help needed to walk in hospital room?: Total Help needed climbing 3-5 steps with a railing? : Total 6 Click Score: 11    End of Session   Activity Tolerance: Patient tolerated treatment well Patient left: in bed;with call bell/phone within reach;with family/visitor present   PT Visit  Diagnosis: Unsteadiness on feet (R26.81);Other abnormalities of gait and mobility (R26.89);Muscle weakness (generalized) (M62.81);Difficulty in walking, not elsewhere classified (R26.2)    Time: 9182-9145 PT Time Calculation (min) (ACUTE ONLY): 37 min   Charges:   PT Evaluation $PT Eval Moderate Complexity: 1 Mod PT Treatments $Therapeutic Activity: 8-22 mins PT General Charges $$ ACUTE PT VISIT: 1 Visit         Theo Ferretti, PT, DPT Acute Rehabilitation Services  Office: [phone]   Theo CHRISTELLA Ferretti 12/23/2023, 10:17 AM

## 2023-12-24 ENCOUNTER — Other Ambulatory Visit (HOSPITAL_COMMUNITY): Payer: Self-pay

## 2023-12-24 DIAGNOSIS — I6381 Other cerebral infarction due to occlusion or stenosis of small artery: Secondary | ICD-10-CM | POA: Diagnosis not present

## 2023-12-24 DIAGNOSIS — I6340 Cerebral infarction due to embolism of unspecified cerebral artery: Secondary | ICD-10-CM | POA: Diagnosis not present

## 2023-12-24 DIAGNOSIS — R29818 Other symptoms and signs involving the nervous system: Secondary | ICD-10-CM | POA: Diagnosis not present

## 2023-12-24 DIAGNOSIS — I4891 Unspecified atrial fibrillation: Secondary | ICD-10-CM | POA: Diagnosis not present

## 2023-12-24 DIAGNOSIS — R29703 NIHSS score 3: Secondary | ICD-10-CM | POA: Diagnosis not present

## 2023-12-24 LAB — BASIC METABOLIC PANEL WITH GFR
Anion gap: 12 (ref 5–15)
BUN: 27 mg/dL — ABNORMAL HIGH (ref 8–23)
CO2: 22 mmol/L (ref 22–32)
Calcium: 9 mg/dL (ref 8.9–10.3)
Chloride: 105 mmol/L (ref 98–111)
Creatinine, Ser: 1.53 mg/dL — ABNORMAL HIGH (ref 0.61–1.24)
GFR, Estimated: 44 mL/min — ABNORMAL LOW (ref >60)
Glucose, Bld: 140 mg/dL — ABNORMAL HIGH (ref 70–99)
Potassium: 4.1 mmol/L (ref 3.5–5.1)
Sodium: 139 mmol/L (ref 135–145)

## 2023-12-24 LAB — MRSA NEXT GEN BY PCR, NASAL: MRSA by PCR Next Gen: NOT DETECTED

## 2023-12-24 MED ORDER — PREDNISONE 20 MG PO TABS
50.0000 mg | ORAL_TABLET | Freq: Every day | ORAL | Status: DC
  Administered 2023-12-24 – 2023-12-25 (×2): 50 mg via ORAL
  Filled 2023-12-24 (×2): qty 2

## 2023-12-24 MED ORDER — APIXABAN 5 MG PO TABS
5.0000 mg | ORAL_TABLET | Freq: Two times a day (BID) | ORAL | Status: DC
  Administered 2023-12-24 – 2023-12-25 (×2): 5 mg via ORAL
  Filled 2023-12-24 (×2): qty 1

## 2023-12-24 NOTE — Progress Notes (Signed)
 STROKE TEAM PROGRESS NOTE    SIGNIFICANT HOSPITAL EVENTS 10/4 presented from his assisted living facility for right-sided facial droop and dysarthria  INTERIM HISTORY/SUBJECTIVE Patient seen at the bedside in echo suite in no apparent distress Neurological exam is unchanged.  No new symptoms.  Patient cannot afford Pradaxa as co-pay is too high.  CBC    Component Value Date/Time   WBC 5.9 12/23/2023 0413   RBC 4.54 12/23/2023 0413   HGB 14.3 12/23/2023 0413   HGB 15.4 09/12/2021 1539   HCT 42.2 12/23/2023 0413   HCT 45.0 09/12/2021 1539   PLT 163 12/23/2023 0413   PLT 160 09/12/2021 1539   MCV 93.0 12/23/2023 0413   MCV 97 09/12/2021 1539   MCH 31.5 12/23/2023 0413   MCHC 33.9 12/23/2023 0413   RDW 12.9 12/23/2023 0413   RDW 12.8 09/12/2021 1539   LYMPHSABS 2.3 12/22/2023 1752   MONOABS 0.9 12/22/2023 1752   EOSABS 0.3 12/22/2023 1752   BASOSABS 0.1 12/22/2023 1752    BMET    Component Value Date/Time   NA 139 12/24/2023 0518   NA 142 09/28/2022 0000   K 4.1 12/24/2023 0518   CL 105 12/24/2023 0518   CO2 22 12/24/2023 0518   GLUCOSE 140 (H) 12/24/2023 0518   BUN 27 (H) 12/24/2023 0518   BUN 19 09/28/2022 0000   CREATININE 1.53 (H) 12/24/2023 0518   CALCIUM  9.0 12/24/2023 0518   EGFR 69 09/28/2022 0000   EGFR 63 09/12/2021 1539   GFRNONAA 44 (L) 12/24/2023 0518    IMAGING past 24 hours No results found.   Vitals:   12/24/23 0222 12/24/23 0322 12/24/23 0722 12/24/23 1122  BP: 109/66 108/61 122/64 130/68  Pulse:  66 63 75  Resp: 18 20 19 20   Temp:  98 F (36.7 C) 97.6 F (36.4 C) 97.6 F (36.4 C)  TempSrc:  Oral  Oral  SpO2:  93% 94% 95%  Weight:      Height:         PHYSICAL EXAM General:  Alert, well-nourished, well-developed patient in no acute distress Psych:  Mood and affect appropriate for situation CV: Regular rate and rhythm on monitor Respiratory:  Regular, unlabored respirations on room air GI: Abdomen soft and nontender   NEURO:   Mental Status: AA&Ox3, patient is able to give clear and coherent history Speech/Language: speech is without  aphasia.  Dysarthric speech, some hesitancy with speech naming, repetition, fluency, and comprehension intact.  Cranial Nerves:  II: PERRL. Visual fields full.  III, IV, VI: EOMI. Eyelids elevate symmetrically.  V: Sensation is intact to light touch and symmetrical to face.  VII: Right facial droop VIII: hearing intact to voice. IX, X: Palate elevates symmetrically. Phonation is normal.  KP:Dynloizm shrug 5/5. XII: tongue is midline without fasciculations. Motor: 5/5 strength to all muscle groups tested.  Tone: is normal and bulk is normal Sensation- Intact to light touch bilaterally. Extinction absent to light touch to DSS.   Coordination: FTN intact bilaterally, HKS: no ataxia in BLE.No drift.  Gait- deferred  Most Recent NIH 3   ASSESSMENT/PLAN  Edward Maynard is a 89 y.o. male with history of atrial fibrillation on Eliquis , HLD, right MCA stroke s/p thrombectomy with reocclusion and residual left-sided weakness, who presents to the ED with new onset of right facial droop, dysarthria with difficulty speaking. At 4:50 PM he was doing well and then was noticed by staff at his facility ([name]) to have a change  in his speech and right-sided facial droop. No headache. Has had some mild shortness of breath recently.  NIH on Admission 10  Acute Ischemic Infarct:  bilateral left precentral gyrus and right cerebellar  Etiology: Cardioembolic from A-fib despite anticoagulation with Eliquis  Code Stroke  CT head No acute abnormality.  Small vessel disease. Atrophy.  ASPECTS 10.    chronic infarcts  CTA head & neck right middle cerebral artery proximal M2 branch which was occluded new severe stenosis within this vessel immediately upstream. MRI acute left precentral gyrus and lacunar infarct in right cerebellar hemisphere generalized volume loss and advanced periventricular and  deep cerebral white matter disease 2D Echo EF 60 to 65%. LDL 78 HgbA1c 5.6 VTE prophylaxis -Eliquis  Eliquis  (apixaban ) daily prior to admission, now on Eliquis  (apixaban ) daily could consider switching to Pradaxa if he could afford it Therapy recommendations:  Pending Disposition: Pending  Hx of Stroke/TIA Prior right MCA stroke with residual left-sided weakness in 2015 History of stroke in February 24 at that time he was on Xarelto  and was switched to Eliquis   Atrial fibrillation Home Meds: Eliquis  Continue telemetry monitoring Continue anticoagulation with Eliquis   Hypertension Home meds: Norvasc  10 mg, Stable Blood Pressure Goal: SBP less than 160   Hyperlipidemia Home meds: Atorvastatin  20 mg,  resumed in hospital LDL 78, goal < 70 Increased to 80 mg Continue statin at discharge  Dysphagia Patient has post-stroke dysphagia, SLP consulted    Diet   Diet Heart Room service appropriate? Yes; Fluid consistency: Thin   Advance diet as tolerated  Other Stroke Risk Factors Obesity, Body mass index is 31.22 kg/m., BMI >/= 30 associated with increased stroke risk, recommend weight loss, diet and exercise as appropriate    Other Active Problems Multifocal pneumonia-management per primary team Depression and anxiety CKD  Hospital day # 1     He presented with sudden onset of dysarthria and facial droop due to embolic left precentral gyrus infarct likely from A-fib despite anticoagulation with Eliquis .  Discussed lack of definitive data suggesting switching Eliquis  to alternative NOAC is necessarily superior.   Is co-pay for Pradaxa is much higher and he cannot afford it hence recommend continue Eliquis  for now.SABRA Media out of bed.  Therapy consults.  Long discussion with patient and answered questions..  Follow-up as an outpatient in the stroke clinic with nurse practitioner in 2 months.  Stroke team will sign off.  Kindly call for questions.   I personally spent a  total of 35 minutes in the care of the patient today including getting/reviewing separately obtained history, performing a medically appropriate exam/evaluation, counseling and educating, placing orders, referring and communicating with other health care professionals, documenting clinical information in the EHR, independently interpreting results, and coordinating care.        Eather Popp, MD Medical Director [facility] Stroke Center Pager: [phone] 12/24/2023 2:47 PM  To contact Stroke Continuity provider, please refer to [email or web address]. After hours, contact General Neurology

## 2023-12-24 NOTE — TOC Initial Note (Signed)
 Transition of Care [facility]) - Initial/Assessment Note    Patient Details  Name: Edward Maynard MRN: [id number] Date of Birth: [birth year]  [facility]) CM/SW Contact:    Almarie CHRISTELLA Goodie, LCSW Phone Number: 12/24/2023, 1:14 PM  Clinical Narrative:     CSW spoke with patient's daughter, [name]. Patient is a resident at [facility]. Daughter in agreement with HH upon return to ALF, and would be interested in seeing if the one arm wheelchair would be covered under patient's insurance. CSW attempted to reach [name] to discuss [facility] and DME, left a voicemail asking for return call. CSW to follow.              Expected Discharge Plan: Assisted Living Barriers to Discharge: Continued Medical Work up   Patient Goals and CMS Choice Patient states their goals for this hospitalization and ongoing recovery are:: patient unable to participate in goal setting, not fully oriented CMS Medicare.gov Compare Post Acute Care list provided to:: Patient Represenative (must comment) Choice offered to / list presented to : Adult Children [place] ownership interest in [facility].provided to:: Adult Children    Expected Discharge Plan and Services     Post Acute Care Choice: Home Health, Durable Medical Equipment Living arrangements for the past 2 months: Assisted Living Facility                                      Prior Living Arrangements/Services Living arrangements for the past 2 months: Assisted Living Facility Lives with:: Facility Resident Patient language and need for interpreter reviewed:: No Do you feel safe going back to the place where you live?: Yes      Need for Family Participation in Patient Care: Yes (Comment) Care giver support system in place?: Yes (comment) Current home services: DME Criminal Activity/Legal Involvement Pertinent to Current Situation/Hospitalization: No - Comment as needed  Activities of Daily Living   ADL Screening (condition  at time of admission) Independently performs ADLs?: No Does the patient have a NEW difficulty with bathing/dressing/toileting/self-feeding that is expected to last >3 days?: No Does the patient have a NEW difficulty with getting in/out of bed, walking, or climbing stairs that is expected to last >3 days?: No Does the patient have a NEW difficulty with communication that is expected to last >3 days?: Yes (Initiates electronic notice to provider for possible SLP consult) Is the patient deaf or have difficulty hearing?: No Does the patient have difficulty seeing, even when wearing glasses/contacts?: No Does the patient have difficulty concentrating, remembering, or making decisions?: No  Permission Sought/Granted Permission sought to share information with : Facility Medical sales representative, Family Supports Permission granted to share information with : Yes, Verbal Permission Granted  Share Information with NAME: [name]  Permission granted to share info w AGENCY: [name]  Permission granted to share info w Relationship: Daughter     Emotional Assessment   Attitude/Demeanor/Rapport: Unable to Assess Affect (typically observed): Unable to Assess Orientation: : Oriented to Self, Oriented to Place, Oriented to Situation Alcohol / Substance Use: Not Applicable Psych Involvement: No (comment)  Admission diagnosis:  CVA (cerebral vascular accident) (HCC) [I63.9] Dysarthria [R47.1] Stroke-like symptoms [R29.90] Acute focal neurological deficit [R29.818] Acute bronchitis, unspecified organism [J20.9] Patient Active Problem List   Diagnosis Date Noted   Acute focal neurological deficit 12/22/2023   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   Pulmonary lesion,  right 12/22/2023   Depression 12/22/2023   Shoulder impingement syndrome, right 08/10/2022   CKD (chronic kidney disease) 05/18/2022   Insomnia 05/18/2022   CVA (cerebral vascular accident) (HCC) 05/18/2022   Essential hypertension  05/16/2022   Obesity 05/16/2022   Advanced age 89/27/2024   Dysphagia 05/16/2022   History of stroke with current residual effects 05/10/2022   Middle cerebral artery embolism, 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   Chronic a-fib (HCC) 05/10/2022   Paroxysmal atrial fibrillation (HCC) 01/18/2015   Chronic anticoagulation 01/18/2015   PVC's (premature ventricular contractions) 12/14/2014   NSVT (nonsustained ventricular tachycardia) (HCC) 12/14/2014   Cerebral infarction due to embolism of cerebral artery (HCC) 07/10/2014   Hyperlipidemia 12/12/2013   History of colonic polyps 10/23/2013   PCP:  Sherre [place], FNP Pharmacy:   [id number] - [place], [place] - [address] [place] [address] Phone: [phone] Fax: [phone]  [facility]) - [place], [place] - [address] [facility] [address] [place] [address] [place] [address] Phone: [phone] Fax: [phone]  [facility] - [place], [place] - [address] [place] [address] Phone: [phone] Fax: [phone]  [facility] [address] [place] [address] Phone: [phone] Fax: [phone]     Social Drivers of Health (SDOH) Social History: SDOH Screenings   Food Insecurity: No Food Insecurity (12/22/2023)  Housing: Low Risk  (12/22/2023)  Transportation Needs: No Transportation Needs (12/22/2023)  Utilities: Not At Risk (12/22/2023)  Depression (PHQ2-9): Low Risk  (11/10/2022)  Social Connections: Moderately Integrated (12/22/2023)  Tobacco Use: Medium Risk (12/22/2023)   SDOH Interventions:     Readmission Risk Interventions     No data to display

## 2023-12-24 NOTE — Plan of Care (Signed)
 Problem: Education: Goal: Knowledge of disease or condition will improve 12/24/2023 1711 by Jenel Bobetta SAILOR, RN Outcome: Progressing 12/24/2023 1021 by Jenel Bobetta SAILOR, RN Outcome: Progressing Goal: Knowledge of secondary prevention will improve (MUST DOCUMENT ALL) 12/24/2023 1711 by Jenel Bobetta SAILOR, RN Outcome: Progressing 12/24/2023 1021 by Jenel Bobetta SAILOR, RN Outcome: Progressing Goal: Knowledge of patient specific risk factors will improve (DELETE if not current risk factor) 12/24/2023 1711 by Jenel Bobetta SAILOR, RN Outcome: Progressing 12/24/2023 1021 by Jenel Bobetta SAILOR, RN Outcome: Progressing   Problem: Ischemic Stroke/TIA Tissue Perfusion: Goal: Complications of ischemic stroke/TIA will be minimized 12/24/2023 1711 by Jenel Bobetta SAILOR, RN Outcome: Progressing 12/24/2023 1021 by Jenel Bobetta SAILOR, RN Outcome: Progressing   Problem: Coping: Goal: Will verbalize positive feelings about self 12/24/2023 1711 by Jenel Bobetta SAILOR, RN Outcome: Progressing 12/24/2023 1021 by Jenel Bobetta SAILOR, RN Outcome: Progressing Goal: Will identify appropriate support needs 12/24/2023 1711 by Jenel Bobetta SAILOR, RN Outcome: Progressing 12/24/2023 1021 by Jenel Bobetta SAILOR, RN Outcome: Progressing   Problem: Health Behavior/Discharge Planning: Goal: Ability to manage health-related needs will improve 12/24/2023 1711 by Jenel Bobetta SAILOR, RN Outcome: Progressing 12/24/2023 1021 by Jenel Bobetta SAILOR, RN Outcome: Progressing Goal: Goals will be collaboratively established with patient/family 12/24/2023 1711 by Jenel Bobetta SAILOR, RN Outcome: Progressing 12/24/2023 1021 by Jenel Bobetta SAILOR, RN Outcome: Progressing   Problem: Self-Care: Goal: Ability to participate in self-care as condition permits will improve 12/24/2023 1711 by Jenel Bobetta SAILOR, RN Outcome: Progressing 12/24/2023 1021 by Jenel Bobetta SAILOR, RN Outcome: Progressing Goal: Verbalization of feelings and concerns over difficulty with self-care will  improve 12/24/2023 1711 by Jenel Bobetta SAILOR, RN Outcome: Progressing 12/24/2023 1021 by Jenel Bobetta SAILOR, RN Outcome: Progressing Goal: Ability to communicate needs accurately will improve 12/24/2023 1711 by Jenel Bobetta SAILOR, RN Outcome: Progressing 12/24/2023 1021 by Jenel Bobetta SAILOR, RN Outcome: Progressing   Problem: Nutrition: Goal: Risk of aspiration will decrease 12/24/2023 1711 by Jenel Bobetta SAILOR, RN Outcome: Progressing 12/24/2023 1021 by Jenel Bobetta SAILOR, RN Outcome: Progressing Goal: Dietary intake will improve 12/24/2023 1711 by Jenel Bobetta SAILOR, RN Outcome: Progressing 12/24/2023 1021 by Jenel Bobetta SAILOR, RN Outcome: Progressing   Problem: Education: Goal: Knowledge of General Education information will improve Description: Including pain rating scale, medication(s)/side effects and non-pharmacologic comfort measures 12/24/2023 1711 by Jenel Bobetta SAILOR, RN Outcome: Progressing 12/24/2023 1021 by Jenel Bobetta SAILOR, RN Outcome: Progressing   Problem: Health Behavior/Discharge Planning: Goal: Ability to manage health-related needs will improve 12/24/2023 1711 by Jenel Bobetta SAILOR, RN Outcome: Progressing 12/24/2023 1021 by Jenel Bobetta SAILOR, RN Outcome: Progressing   Problem: Clinical Measurements: Goal: Ability to maintain clinical measurements within normal limits will improve 12/24/2023 1711 by Jenel Bobetta SAILOR, RN Outcome: Progressing 12/24/2023 1021 by Jenel Bobetta SAILOR, RN Outcome: Progressing Goal: Will remain free from infection 12/24/2023 1711 by Jenel Bobetta SAILOR, RN Outcome: Progressing 12/24/2023 1021 by Jenel Bobetta SAILOR, RN Outcome: Progressing Goal: Diagnostic test results will improve 12/24/2023 1711 by Jenel Bobetta SAILOR, RN Outcome: Progressing 12/24/2023 1021 by Jenel Bobetta SAILOR, RN Outcome: Progressing Goal: Respiratory complications will improve 12/24/2023 1711 by Jenel Bobetta SAILOR, RN Outcome: Progressing 12/24/2023 1021 by Jenel Bobetta SAILOR, RN Outcome: Progressing Goal:  Cardiovascular complication will be avoided 12/24/2023 1711 by Jenel Bobetta SAILOR, RN Outcome: Progressing 12/24/2023 1021 by Jenel Bobetta SAILOR, RN Outcome: Progressing   Problem: Activity: Goal: Risk for activity intolerance will decrease 12/24/2023 1711 by Jenel Bobetta SAILOR, RN Outcome: Progressing 12/24/2023 1021 by  Jenel Bobetta SAILOR, RN Outcome: Progressing   Problem: Nutrition: Goal: Adequate nutrition will be maintained 12/24/2023 1711 by Jenel Bobetta SAILOR, RN Outcome: Progressing 12/24/2023 1021 by Jenel Bobetta SAILOR, RN Outcome: Progressing   Problem: Coping: Goal: Level of anxiety will decrease 12/24/2023 1711 by Jenel Bobetta SAILOR, RN Outcome: Progressing 12/24/2023 1021 by Jenel Bobetta SAILOR, RN Outcome: Progressing   Problem: Elimination: Goal: Will not experience complications related to bowel motility 12/24/2023 1711 by Jenel Bobetta SAILOR, RN Outcome: Progressing 12/24/2023 1021 by Jenel Bobetta SAILOR, RN Outcome: Progressing Goal: Will not experience complications related to urinary retention 12/24/2023 1711 by Jenel Bobetta SAILOR, RN Outcome: Progressing 12/24/2023 1021 by Jenel Bobetta SAILOR, RN Outcome: Progressing   Problem: Pain Managment: Goal: General experience of comfort will improve and/or be controlled 12/24/2023 1711 by Jenel Bobetta SAILOR, RN Outcome: Progressing 12/24/2023 1021 by Jenel Bobetta SAILOR, RN Outcome: Progressing   Problem: Safety: Goal: Ability to remain free from injury will improve 12/24/2023 1711 by Jenel Bobetta SAILOR, RN Outcome: Progressing 12/24/2023 1021 by Jenel Bobetta SAILOR, RN Outcome: Progressing   Problem: Skin Integrity: Goal: Risk for impaired skin integrity will decrease 12/24/2023 1711 by Jenel Bobetta SAILOR, RN Outcome: Progressing 12/24/2023 1021 by Jenel Bobetta SAILOR, RN Outcome: Progressing

## 2023-12-24 NOTE — Evaluation (Signed)
 Speech Language Pathology Evaluation Patient Details Name: Edward Maynard MRN: [id number] DOB: 17-Apr-1933 Today's Date: 12/24/2023 Time: 8468-8453 SLP Time Calculation (min) (ACUTE ONLY): 15 min  Problem List:  Patient Active Problem List   Diagnosis Date Noted   Acute focal neurological deficit 12/22/2023   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   Pulmonary lesion, right 12/22/2023   Depression 12/22/2023   Shoulder impingement syndrome, right 08/10/2022   CKD (chronic kidney disease) 05/18/2022   Insomnia 05/18/2022   CVA (cerebral vascular accident) (HCC) 05/18/2022   Essential hypertension 05/16/2022   Obesity 05/16/2022   Advanced age 30/27/2024   Dysphagia 05/16/2022   History of stroke with current residual effects 05/10/2022   Middle cerebral artery embolism, 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   Chronic a-fib (HCC) 05/10/2022   Paroxysmal atrial fibrillation (HCC) 01/18/2015   Chronic anticoagulation 01/18/2015   PVC's (premature ventricular contractions) 12/14/2014   NSVT (nonsustained ventricular tachycardia) (HCC) 12/14/2014   Cerebral infarction due to embolism of cerebral artery (HCC) 07/10/2014   Hyperlipidemia 12/12/2013   History of colonic polyps 10/23/2013   Past Medical History:  Past Medical History:  Diagnosis Date   High cholesterol    05/2013; no defecits   Hyperlipemia    Left arm weakness    Left hand weakness    Personal history of colonic polyp - adenoma  10/23/2013   Stroke (HCC) 05/2013   MRI on 07/02/13 = Multifocal acute & subacute infarction involving right frontal MCA/ACA & left parietal MCA/PCA watershed areas   Past Surgical History:  Past Surgical History:  Procedure Laterality Date   IR CT HEAD LTD  05/10/2022   IR PERCUTANEOUS ART THROMBECTOMY/INFUSION INTRACRANIAL INC DIAG ANGIO  05/10/2022   LOOP RECORDER IMPLANT N/A 10/31/2013   Procedure: LOOP RECORDER IMPLANT;  Surgeon: Lynwood JONETTA Rakers, MD;  Location: [facility] CATH LAB;  Service: Cardiovascular;   Laterality: N/A;   RADIOLOGY WITH ANESTHESIA N/A 05/10/2022   Procedure: IR WITH ANESTHESIA;  Surgeon: Dolphus Carrion, MD;  Location: [facility] OR;  Service: Radiology;  Laterality: N/A;   SHOULDER SURGERY Bilateral 2003, 1995   HPI:  Edward Maynard is an 89 y.o. male who presented from ALF on 12/22/23 with R facial droop and dysarthria. MRI acute cortical infarct in the left precentral gyrus and acute lacunar infarct in the right cerebellar hemisphere. Chronic encephalomalacia in the right parietal, temporal, and occipital  lobes, and in the frontal lobes bilaterally. PMH: A-fib on Xarelto , HLD, right MCA stroke s/p thrombectomy with reocclusion and residual left-sided weakness   Assessment / Plan / Recommendation Clinical Impression  Pt presents with a moderate dysarthria of speech as a result of acute left CVA.  There are focal CN deficits - right VII and XII. Speech is fluent but lacks clarity of consonants; resonance is hypernasal; voice has pitch breaks and is hoarse/harsh. Content is appropriate with regard to expressive language. Pt able to tell stories with proper sequencing and pragmatics.  Long-term recall is quite good. Recommend f/u at next level of care to address dysarthria. Pt/family (at bedside) agree with plan.    SLP Assessment  SLP Recommendation/Assessment: All further Speech Language Pathology needs can be addressed in the next venue of care SLP Visit Diagnosis: Dysarthria and anarthria (R47.1)     Assistance Recommended at Discharge  Frequent or constant Supervision/Assistance                 SLP Evaluation Cognition  Overall Cognitive Status: History of cognitive impairments - at baseline Orientation Level:  Disoriented to time       Comprehension  Auditory Comprehension Overall Auditory Comprehension: Appears within functional limits for tasks assessed Yes/No Questions: Within Functional Limits Commands: Within Functional Limits (for 1 and 2 step) Reading  Comprehension Reading Status: Not tested    Expression Expression Primary Mode of Expression: Verbal Verbal Expression Overall Verbal Expression: Appears within functional limits for tasks assessed Initiation: No impairment Automatic Speech: Social Response;Counting Level of Generative/Spontaneous Verbalization: Conversation Repetition: No impairment Naming: No impairment Pragmatics: No impairment Written Expression Written Expression: Not tested   Oral / Motor  Oral Motor/Sensory Function Overall Oral Motor/Sensory Function: Mild impairment Facial ROM: Reduced right;Suspected CN VII (facial) dysfunction Facial Symmetry: Abnormal symmetry right;Suspected CN VII (facial) dysfunction Facial Strength: Reduced right;Suspected CN VII (facial) dysfunction Lingual Symmetry: Abnormal symmetry right;Suspected CN XII (hypoglossal) dysfunction Motor Speech Overall Motor Speech: Impaired Respiration: Impaired Level of Impairment: Sentence Phonation: Other (comment) (harsh) Resonance: Hypernasality Intelligibility: Intelligibility reduced Sentence: 75-100% accurate Conversation: 75-100% accurate            Vona Palma Laurice 12/24/2023, 3:59 PM Shamona Wirtz L. Vona, MA CCC/SLP Clinical Specialist - Acute Care SLP Acute Rehabilitation Services Office number [phone]

## 2023-12-24 NOTE — Progress Notes (Signed)
 Consulted to transition apixaban  to pradaxa but his insurance doesn't cover pradaxa or edoxaban. Ok with continuing with apixaban  per Dr. Rosemarie.  Sergio Batch, PharmD, BCIDP, AAHIVP, CPP Infectious Disease Pharmacist 12/24/2023 1:29 PM

## 2023-12-24 NOTE — Consult Note (Signed)
 Cardiology Consultation   Patient ID: Edward Maynard MRN: [id number]; DOB: 07/19/1933  Admit date: 12/22/2023 Date of Consult: 12/24/2023  PCP:  Edward Rea, FNP   [facility] Providers Cardiologist:  Edward FORBES Furbish, MD      Patient Profile: Edward Maynard is a 89 y.o. male with a hx of paroxysmal atrial fibrillation, PVCs, NSVT, prior CVA, and hyperlipidemia who is being seen 12/24/2023 for the evaluation of atrial fibrillation with RVR at the request of Edward Maynard.  History of Present Illness: Edward Maynard is an 89 year old male with prior cardiac history listed below  On 06/2013 the patient was hospitalized for a cryptogenic stroke.  A loop recorder was implanted on 10/2013 the loop recorder found atrial fibrillation.  Because of this the patient was started on Xarelto .  On 08/2021 the patient complained of fatigue and tiredness in the afternoons.  Because of this the metoprolol  was decreased to 12.5 mg daily.  This did not improve the patient's symptoms so went back on 25 mg daily.  At follow-up visit on 12/2021 patient reported syncope/presyncope and dizziness while standing.  Because of this a Zio patch was ordered and showed.  This showed sinus rhythm with heart rates 53-122.  The patient events correlated to PACs and sinus rhythm.  The patient also had a run of NSVT and a run of SVT.  On 04/2022 the patient was admitted for a CVA while on Xarelto .  Because of this was switched to Eliquis .  Echocardiogram at this time showed normal LVEF of 60 to 65%, G2 DD, normal RV function, and grossly normal valve function.  Patient was hospitalized on 12/22/2023 for difficulty speaking and right-sided facial droop.  Imaging showed an acute ischemic infarct that was suspected to be cardioembolic despite the patient being on Eliquis .  Labs showed potassium of 4.1, creatinine of 1.53, BUN of 27, calcium  of 9.0, and normal hemoglobin of 14.3.  EKG showed atrial fibrillation with a rate  of 130. Now converted to NSR    Echo showed a normal LVEF of 60 to 65%, normal RV function, moderate aortic stenosis.  Past Medical History:  Diagnosis Date   High cholesterol    05/2013; no defecits   Hyperlipemia    Left arm weakness    Left hand weakness    Personal history of colonic polyp - adenoma  10/23/2013   Stroke (HCC) 05/2013   MRI on 07/02/13 = Multifocal acute & subacute infarction involving right frontal MCA/ACA & left parietal MCA/PCA watershed areas    Past Surgical History:  Procedure Laterality Date   IR CT HEAD LTD  05/10/2022   IR PERCUTANEOUS ART THROMBECTOMY/INFUSION INTRACRANIAL INC DIAG ANGIO  05/10/2022   LOOP RECORDER IMPLANT N/A 10/31/2013   Procedure: LOOP RECORDER IMPLANT;  Surgeon: Edward JONETTA Rakers, MD;  Location: [facility] CATH LAB;  Service: Cardiovascular;  Laterality: N/A;   RADIOLOGY WITH ANESTHESIA N/A 05/10/2022   Procedure: IR WITH ANESTHESIA;  Surgeon: Edward Carrion, MD;  Location: [facility] OR;  Service: Radiology;  Laterality: N/A;   SHOULDER SURGERY Bilateral 2003, 1995     Home Medications:  Prior to Admission medications   Medication Sig Start Date End Date Taking? Authorizing Provider  acetaminophen  (TYLENOL ) 325 MG tablet Take 2 tablets (650 mg total) by mouth every 4 (four) hours as needed for mild pain (or temp > 37.5 C (99.5 F)). 06/07/22  Yes Angiulli, Toribio PARAS, PA-C  amLODipine  (NORVASC ) 10 MG tablet Take 1 tablet (10 mg total) by  mouth daily. 05/19/22  Yes Waddell Karna LABOR, NP  apixaban  (ELIQUIS ) 5 MG TABS tablet Take 1 tablet (5 mg total) by mouth 2 (two) times daily. 05/18/22  Yes Waddell Karna LABOR, NP  atorvastatin  (LIPITOR) 20 MG tablet Take 1 tablet (20 mg total) by mouth daily. 05/19/22  Yes Waddell Karna LABOR, NP  Cholecalciferol 25 MCG (1000 UT) capsule Take 1,000 Units by mouth daily.   Yes [provider]  docusate sodium  (COLACE) 100 MG capsule Take 1 capsule (100 mg total) by mouth daily as needed for mild constipation. 11/03/22  Yes Wert,  Tawni, NP  Emollient (AQUAPHOR OINTMENT BODY EX) Apply 1 application  topically daily. To bilateral extremities   Yes [provider]  FLUoxetine (PROZAC) 10 MG capsule Take 10 mg by mouth daily.   Yes [provider]  folic acid  (FOLVITE ) 400 MCG tablet Take 400 mcg by mouth daily.   Yes [provider]  hydrocortisone 1 % ointment Apply 1 Application topically 3 (three) times daily as needed for itching.   Yes [provider]  hydrOXYzine  (ATARAX ) 25 MG tablet Take 25 mg by mouth every 6 (six) hours as needed for anxiety or itching.   Yes [provider]  mirtazapine  (REMERON ) 7.5 MG tablet Take 7.5 mg by mouth at bedtime.   Yes [provider]  traZODone  (DESYREL ) 50 MG tablet Take 50 mg by mouth at bedtime.   Yes [provider]  triamcinolone  cream (KENALOG ) 0.1 % Apply 1 Application topically 2 (two) times daily as needed (rash). Affected areas of leg   Yes [provider]    Scheduled Meds:   stroke: early stages of recovery book   Does not apply Once   apixaban   5 mg Oral BID   atorvastatin   80 mg Oral Daily   diltiazem   120 mg Oral Daily   FLUoxetine  10 mg Oral Daily   folic acid   0.5 mg Oral Daily   mirtazapine   7.5 mg Oral QHS   predniSONE  50 mg Oral Q breakfast   traZODone   50 mg Oral QHS   Continuous Infusions:  cefTRIAXone (ROCEPHIN)  IV 2 g (12/24/23 1010)   PRN Meds: acetaminophen  **OR** acetaminophen  (TYLENOL ) oral liquid 160 mg/5 mL **OR** acetaminophen , guaiFENesin, hydrOXYzine , ipratropium-albuterol, prochlorperazine, senna  Allergies:   No Known Allergies  Social History:   Social History   Socioeconomic History   Marital status: Married    Spouse name: lea   Number of children: 2   Years of education: college   Highest education level: Not on file  Occupational History   Occupation: retired  Tobacco Use   Smoking status: Former    Current packs/day: 0.00    Types: Cigarettes     Quit date: 03/20/1970    Years since quitting: 53.8   Smokeless tobacco: Never  Vaping Use   Vaping status: Never Used  Substance and Sexual Activity   Alcohol use: No   Drug use: No   Sexual activity: Yes  Other Topics Concern   Not on file  Social History Narrative   Patient right handed   Patient lives with his wife   Patient coffee daily   Enjoys playing golf   Social Drivers of Health   Financial Resource Strain: Not on file  Food Insecurity: No Food Insecurity (12/22/2023)   Hunger Vital Sign    Worried About Running Out of Food in the Last Year: Never true    Ran Out of  Food in the Last Year: Never true  Transportation Needs: No Transportation Needs (12/22/2023)   PRAPARE - Administrator, Civil Service (Medical): No    Lack of Transportation (Non-Medical): No  Physical Activity: Not on file  Stress: Not on file  Social Connections: Moderately Integrated (12/22/2023)   Social Connection and Isolation Panel    Frequency of Communication with Friends and Family: Three times a week    Frequency of Social Gatherings with Friends and Family: Three times a week    Attends Religious Services: 1 to 4 times per year    Active Member of Clubs or Organizations: No    Attends Banker Meetings: Never    Marital Status: Married  Catering manager Violence: Not At Risk (12/22/2023)   Humiliation, Afraid, Rape, and Kick questionnaire    Fear of Current or Ex-Partner: No    Emotionally Abused: No    Physically Abused: No    Sexually Abused: No    Family History:    Family History  Problem Relation Age of Onset   Hypertension Mother    Hypertension Father    Stroke Father    COPD Brother    Stroke Brother    Colon cancer Neg Hx    Heart attack Neg Hx      ROS:  Please see the history of present illness.   All other ROS reviewed and negative.     Physical Exam/Data: Vitals:   12/24/23 0122 12/24/23 0222 12/24/23 0322 12/24/23 0722  BP: 108/61  109/66 108/61 122/64  Pulse: 63  66 63  Resp: 16 18 20 19   Temp:   98 F (36.7 C) 97.6 F (36.4 C)  TempSrc:   Oral   SpO2: 92%  93% 94%  Weight:      Height:        Intake/Output Summary (Last 24 hours) at 12/24/2023 1033 Last data filed at 12/24/2023 0730 Gross per 24 hour  Intake 240 ml  Output 700 ml  Net -460 ml      12/22/2023    6:10 PM 12/22/2023    5:00 PM 11/10/2022   10:09 AM  Last 3 Weights  Weight (lbs) 230 lb 2.6 oz 230 lb 2.6 oz --  Weight (kg) 104.4 kg 104.4 kg --     Body mass index is 31.22 kg/m.   Elderly male Left facial droop with new dysarthria Lungs clear SEM of AS Abdomen benign LUE weak and contracted at wrist LLE weakness  Trace edema  Relevant CV Studies:  Echo cardiogram 12/23/23   IMPRESSIONS     1. Left ventricular ejection fraction, by estimation, is 60 to 65%. The  left ventricle has normal function. The left ventricle has no regional  wall motion abnormalities. Left ventricular diastolic parameters were  normal.   2. Right ventricular systolic function is normal. The right ventricular  size is normal.   3. The mitral valve is normal in structure. Trivial mitral valve  regurgitation. No evidence of mitral stenosis.   4. DVI 0.31. The aortic valve is normal in structure. Aortic valve  regurgitation is mild. Moderate aortic valve stenosis. Aortic valve area,  by VTI measures 1.08 cm. Aortic valve mean gradient measures 24.0 mmHg.  Aortic valve Vmax measures 3.17 m/s.   5. The inferior vena cava not well visualized.   Laboratory Data: High Sensitivity Troponin:  No results for input(s): TROPONINIHS in the last 720 hours.   Chemistry Recent Labs  Lab 12/22/23  1752 12/22/23 1826 12/23/23 0413 12/24/23 0518  NA 136 139 138 139  K 3.4* 3.4* 4.1 4.1  CL 102  --  104 105  CO2 23  --  20* 22  GLUCOSE 187*  --  206* 140*  BUN 19  --  18 27*  CREATININE 1.27*  --  1.32* 1.53*  CALCIUM  8.8*  --  9.0 9.0  GFRNONAA 55*  --   52* 44*  ANIONGAP 11  --  14 12    Recent Labs  Lab 12/22/23 1752  PROT 7.0  ALBUMIN 3.7  AST 20  ALT 17  ALKPHOS 145*  BILITOT 0.6   Lipids  Recent Labs  Lab 12/23/23 0413  CHOL 127  TRIG 29  HDL 43  LDLCALC 78  CHOLHDL 3.0    Hematology Recent Labs  Lab 12/22/23 1752 12/22/23 1826 12/23/23 0413  WBC 7.4  --  5.9  RBC 4.63  --  4.54  HGB 14.8 13.3 14.3  HCT 43.6 39.0 42.2  MCV 94.2  --  93.0  MCH 32.0  --  31.5  MCHC 33.9  --  33.9  RDW 13.0  --  12.9  PLT 176  --  163   Thyroid  No results for input(s): TSH, FREET4 in the last 168 hours.  BNP Recent Labs  Lab 12/22/23 1752  BNP 178.5*    DDimer No results for input(s): DDIMER in the last 168 hours.  Radiology/Studies:  ECHOCARDIOGRAM COMPLETE Result Date: 12/23/2023    ECHOCARDIOGRAM REPORT   Patient Name:   JERRITT CARDOZA Date of Exam: 12/23/2023 Medical Rec #:  [id number]     Height:       72.0 in Accession #:    [id number]    Weight:       230.2 lb Date of Birth:  1933-07-11    BSA:          2.262 m Patient Age:    89 years      BP:           138/72 mmHg Patient Gender: M             HR:           82 bpm. Exam Location:  Inpatient Procedure: 2D Echo (Both Spectral and Color Flow Doppler were utilized during            procedure). Indications:    stroke  History:        Patient has prior history of Echocardiogram examinations, most                 recent 05/10/2022. Chronic kidney disease, Arrythmias:Atrial                 Fibrillation and PVC; Risk Factors:Dyslipidemia.  Sonographer:    Tinnie Barefoot RDCS Referring Phys: [id number] TIMOTHY S OPYD IMPRESSIONS  1. Left ventricular ejection fraction, by estimation, is 60 to 65%. The left ventricle has normal function. The left ventricle has no regional wall motion abnormalities. Left ventricular diastolic parameters were normal.  2. Right ventricular systolic function is normal. The right ventricular size is normal.  3. The mitral valve is normal in structure.  Trivial mitral valve regurgitation. No evidence of mitral stenosis.  4. DVI 0.31. The aortic valve is normal in structure. Aortic valve regurgitation is mild. Moderate aortic valve stenosis. Aortic valve area, by VTI measures 1.08 cm. Aortic valve mean gradient measures 24.0 mmHg. Aortic valve Vmax measures 3.17 m/s.  5.  The inferior vena cava not well visualized. FINDINGS  Left Ventricle: Left ventricular ejection fraction, by estimation, is 60 to 65%. The left ventricle has normal function. The left ventricle has no regional wall motion abnormalities. The left ventricular internal cavity size was normal in size. There is  no left ventricular hypertrophy. Left ventricular diastolic parameters were normal. Right Ventricle: The right ventricular size is normal. No increase in right ventricular wall thickness. Right ventricular systolic function is normal. Left Atrium: Left atrial size was normal in size. Right Atrium: Right atrial size was normal in size. Pericardium: There is no evidence of pericardial effusion. Mitral Valve: The mitral valve is normal in structure. Trivial mitral valve regurgitation. No evidence of mitral valve stenosis. Tricuspid Valve: The tricuspid valve is normal in structure. Tricuspid valve regurgitation is not demonstrated. No evidence of tricuspid stenosis. Aortic Valve: DVI 0.31. The aortic valve is normal in structure. Aortic valve regurgitation is mild. Aortic regurgitation PHT measures 278 msec. Moderate aortic stenosis is present. Aortic valve mean gradient measures 24.0 mmHg. Aortic valve peak gradient measures 40.2 mmHg. Aortic valve area, by VTI measures 1.08 cm. Pulmonic Valve: The pulmonic valve was not well visualized. Pulmonic valve regurgitation is not visualized. No evidence of pulmonic stenosis. Aorta: Aortic root could not be assessed. Venous: The inferior vena cava not well visualized. IAS/Shunts: No atrial level shunt detected by color flow Doppler.  LEFT VENTRICLE PLAX  2D LVIDd:         5.80 cm   Diastology LVIDs:         3.80 cm   LV e' medial:    5.55 cm/s LV PW:         1.20 cm   LV E/e' medial:  14.4 LV IVS:        1.40 cm   LV e' lateral:   11.50 cm/s LVOT diam:     2.10 cm   LV E/e' lateral: 6.9 LV SV:         70 LV SV Index:   31 LVOT Area:     3.46 cm  RIGHT VENTRICLE RV Basal diam:  3.20 cm RV S prime:     13.30 cm/s TAPSE (M-mode): 2.4 cm LEFT ATRIUM             Index        RIGHT ATRIUM           Index LA diam:        5.40 cm 2.39 cm/m   RA Area:     14.00 cm LA Vol (A2C):   65.4 ml 28.92 ml/m  RA Volume:   35.10 ml  15.52 ml/m LA Vol (A4C):   60.4 ml 26.71 ml/m LA Biplane Vol: 63.1 ml 27.90 ml/m  AORTIC VALVE AV Area (Vmax):    1.09 cm AV Area (Vmean):   1.03 cm AV Area (VTI):     1.08 cm AV Vmax:           317.00 cm/s AV Vmean:          228.000 cm/s AV VTI:            0.654 m AV Peak Grad:      40.2 mmHg AV Mean Grad:      24.0 mmHg LVOT Vmax:         100.00 cm/s LVOT Vmean:        68.000 cm/s LVOT VTI:          0.203 m LVOT/AV VTI  ratio: 0.31 AI PHT:            278 msec  AORTA Ao Root diam: 3.20 cm MR Peak grad: 144.5 mmHg MR Mean grad: 87.0 mmHg    SHUNTS MR Vmax:      601.00 cm/s  Systemic VTI:  0.20 m MR Vmean:     432.0 cm/s   Systemic Diam: 2.10 cm MV E velocity: 79.80 cm/s MV A velocity: 81.90 cm/s MV E/A ratio:  0.97 Franck Azobou Tonleu Electronically signed by Joelle Cedars Tonleu Signature Date/Time: 12/23/2023/12:59:02 PM    Final    MR BRAIN WO CONTRAST Result Date: 12/23/2023 EXAM: MRI BRAIN WITHOUT CONTRAST 12/23/2023 05:36:44 AM TECHNIQUE: Multiplanar multisequence MRI of the head/brain was performed without the administration of intravenous contrast. COMPARISON: MRI of the head dated 05/10/2022. CLINICAL HISTORY: Neuro deficit, acute, stroke suspected. FINDINGS: BRAIN AND VENTRICLES: Acute cortical infarct present within the cortex of the left precentral gyrus seen on image 86 of series 5. Acute lacunar infarct within the right cerebellar  hemisphere seen on image 62. There are chronic encephalomalacia changes within the right parietal, temporal, and occipital lobes secondary to the infarct noted on the previous study. There are also chronic encephalomalacia changes within the frontal lobes bilaterally. There is moderate generalized cerebral volume loss and advanced periventricular and deep cerebral white matter disease. No intracranial hemorrhage. No mass. No midline shift. No hydrocephalus. The sella is unremarkable. Normal flow voids. ORBITS: No acute abnormality. SINUSES AND MASTOIDS: No acute abnormality. BONES AND SOFT TISSUES: Normal marrow signal. No acute soft tissue abnormality. IMPRESSION: 1. Acute cortical infarct in the left precentral gyrus and acute lacunar infarct in the right cerebellar hemisphere. 2. Chronic encephalomalacia in the right parietal, temporal, and occipital lobes, and in the frontal lobes bilaterally. 3. Moderate generalized cerebral volume loss and advanced periventricular and deep cerebral white matter disease. Electronically signed by: Evalene Coho MD 12/23/2023 10:25 AM EDT RP Workstation: [id number]   CT CHEST W CONTRAST Result Date: 12/22/2023 CLINICAL DATA:  Provided history: Respiratory illness, nondiagnostic xray EXAM: CT CHEST WITH CONTRAST TECHNIQUE: Multidetector CT imaging of the chest was performed during intravenous contrast administration. RADIATION DOSE REDUCTION: This exam was performed according to the departmental dose-optimization program which includes automated exposure control, adjustment of the mA and/or kV according to patient size and/or use of iterative reconstruction technique. CONTRAST:  75mL OMNIPAQUE  IOHEXOL  350 MG/ML SOLN COMPARISON:  Radiograph earlier today FINDINGS: Cardiovascular: The heart is normal in size. No pericardial effusion. Coronary artery calcifications. Irregular plaque throughout the thoracic aorta. The ascending aorta is dilated at 4.5 cm. No dissection or acute  aortic finding. No central pulmonary embolus on this non dedicated exam. Mediastinum/Nodes: Shotty mediastinal and hilar lymph nodes, all subcentimeter short axis. Decompressed esophagus. No thyroid  nodule. Lungs/Pleura: Elevated right hemidiaphragm. Adjacent airspace disease in the right lower and middle lobe likely compressive atelectasis. Moderate emphysema. Dependent ground-glass and consolidative opacity in the right upper lobe abutting the fissure and peripherally. Nodular areas of airspace disease in the left lower lobe, series 4, image 121. For example elongated nodular consolidation measures 4 x 1.3 cm, series 4, image 121. No pleural effusion. Upper Abdomen: Elevated right hemidiaphragm. Bilateral renal cysts need no further imaging follow-up. Excreted IV contrast in the renal collecting systems from prior head and neck CTA. Musculoskeletal: Diffuse thoracic spondylosis. There are no acute or suspicious osseous abnormalities. IMPRESSION: 1. Dependent ground-glass and consolidative opacity in the right upper lobe abutting the fissure and peripherally, suspicious for  pneumonia. 2. Nodular areas of airspace disease in the left lower lobe, largest measuring 4 x 1.3 cm also suspicious for infectio; however neoplasm is not excluded. Recommend follow-up CT in 4-6 weeks after course of treatment. 3. Elevated right hemidiaphragm with adjacent airspace disease in the right lower and middle lobes, likely compressive atelectasis. 4. Ascending aortic dilatation at 4.5 cm. Recommend semi-annual imaging followup by CTA or MRA and referral to cardiothoracic surgery if not already obtained. This recommendation follows 2010 ACCF/AHA/AATS/ACR/ASA/SCA/SCAI/SIR/STS/SVM Guidelines for the Diagnosis and Management of Patients With Thoracic Aortic Disease. Circulation. 2010; 121: [id number]. Aortic aneurysm NOS (ICD10-I71.9) Aortic Atherosclerosis (ICD10-I70.0) and Emphysema (ICD10-J43.9). Electronically Signed   By: Andrea Gasman M.D.   On: 12/22/2023 22:03   CT ANGIO HEAD NECK W WO CM (CODE STROKE) Addendum Date: 12/22/2023 ADDENDUM REPORT: 12/22/2023 19:11 ADDENDUM: CTA head impression #1 called by telephone at the time of interpretation on 12/22/2023 at 7:08 pm to provider Dr. Jakie, who verbally acknowledged these results. The progressive focus of ulcerated plaque at the distal right common carotid artery was also discussed at this time. Electronically Signed   By: Rockey Childs D.O.   On: 12/22/2023 19:11   Result Date: 12/22/2023 CLINICAL DATA:  Provided history: Neuro deficit, acute, stroke suspected. EXAM: CT ANGIOGRAPHY HEAD AND NECK WITH AND WITHOUT CONTRAST TECHNIQUE: Multidetector CT imaging of the head and neck was performed using the standard protocol during bolus administration of intravenous contrast. Multiplanar CT image reconstructions and MIPs were obtained to evaluate the vascular anatomy. Carotid stenosis measurements (when applicable) are obtained utilizing NASCET criteria, using the distal internal carotid diameter as the denominator. RADIATION DOSE REDUCTION: This exam was performed according to the departmental dose-optimization program which includes automated exposure control, adjustment of the mA and/or kV according to patient size and/or use of iterative reconstruction technique. CONTRAST:  75mL OMNIPAQUE  IOHEXOL  350 MG/ML SOLN COMPARISON:  Noncontrast head CT performed earlier today 12/22/2023. MRA head 05/10/2022. CT angiogram head/neck 05/10/2022. FINDINGS: CTA NECK FINDINGS Aortic arch: Standard aortic branching. Atherosclerotic plaque within the visualized aortic arch and proximal major branch vessels of the neck. Streak/beam hardening artifact arising from a dense contrast bolus partially obscures the left subclavian artery. Within this limitation, there is no appreciable hemodynamically significant innominate or proximal subclavian artery stenosis. Right carotid system: CCA and ICA patent  within the neck. Atherosclerotic plaque. Muscle the, there is an sclerotic plaque at the carotid bifurcation and within the proximal ICA resulting in less than 50% stenosis. Progressive focus of ulcerated plaque anteriorly at the distal common carotid artery, now measuring 4 mm (series 5, image 230). Partially retropharyngeal course of the cervical ICA. Left carotid system: CCA and ICA patent within the neck. Atherosclerotic plaque. Most notably, progressive (mildly calcified) plaque within the proximal ICA results in an estimated 60-70% stenosis which has progressed (series 5, images 214 and 215). Vertebral arteries: Patent within the neck. Severe stenosis at the right vertebral artery origin, unchanged. Atherosclerotic plaque within the right vertebral artery at the V3/V4 junction with up to moderate stenosis. The left vertebral artery is non dominant and developmentally diminutive, but patent throughout the neck. Atherosclerotic plaque at the left vertebral artery origin. Skeleton: The patient is edentulous. Spondylosis of the cervical and visible thoracic levels. No acute fracture or aggressive osseous lesion. Other neck: 12 mm ovoid nodule again demonstrated within the superficial lobe of the right parotid gland. This is suspicious for a primary parotid neoplasm. Upper chest: Incompletely imaged cystic or cavitary lesion within  the right lower lobe (measuring at least 4.6 cm (series 5, image 344). Mild dependent atelectasis within the bilateral upper lobes and right lower lobe at the imaged levels. Review of the MIP images confirms the above findings CTA HEAD FINDINGS Anterior circulation: The intracranial internal carotid arteries are patent. The right MCA proximal M2 branch which was occluded on the prior CTA of 05/10/2022 is occluded more proximally on today's study. Additionally, there is a new severe stenosis within this vessel immediately upstream. Additional atherosclerotic irregularity of more distal M2  right MCA vessels. The left middle cerebral artery M1 segment is patent. No left M2 proximal branch occlusion or high-grade proximal stenosis is identified. The anterior cerebral arteries are patent. No intracranial aneurysm is identified. Posterior circulation: The intracranial vertebral arteries are patent. Progressive atherosclerotic plaque within the right vertebral artery at the V3/V4 junction now with up to moderate stenosis. The basilar artery is patent. The posterior cerebral arteries are patent. Atherosclerotic irregularity of both vessels without high-grade proximal stenosis. Fetal origin right PCA. The left posterior communicating artery is diminutive or absent. Venous sinuses: Limited assessment for dural venous sinus thrombosis due to contrast timing. Anatomic variants: As described. Review of the MIP images confirms the above findings Attempts are being made to reach the ordering provider at this time. IMPRESSION: CTA neck: 1. The common carotid and internal carotid arteries are patent within the neck. Atherosclerotic plaque bilaterally, as described. Progressive atherosclerotic plaque within the proximal left internal carotid artery now with an estimated 60-70% stenosis at this site. Progressive focus of ulcerated plaque anteriorly at the distal right common carotid artery. 2. The right vertebral artery is dominant and patent within the neck. Unchanged severe atherosclerotic narrowing at the right vertebral artery origin. Progressive atherosclerotic plaque within the right vertebral artery at the V3/V4 junction resulting in up to moderate stenosis. 3. The left vertebral artery is non-dominant and developmentally diminutive, but patent throughout the neck. 4. Aortic Atherosclerosis (ICD10-I70.0). 5. Partially imaged cystic or cavitary pulmonary lesion within the right lower lobe. A dedicated chest CT is recommended for further characterization. 6. Unchanged 12 mm right parotid gland nodule suspicious  for a primary parotid neoplasm. ENT follow-up recommended. CTA head: 1. The right middle cerebral artery proximal M2 branch which was occluded on the prior CTA of 05/10/2022 is occluded more proximally on today's study. Additionally, there is a new severe stenosis within this vessel immediately upstream. 2. No proximal intracranial large vessel occlusion identified elsewhere. 3. Background intracranial atherosclerotic disease as described. Electronically Signed: By: Rockey Childs D.O. On: 12/22/2023 19:06   DG Chest Portable 1 View Result Date: 12/22/2023 CLINICAL DATA:  Shortness of breath EXAM: PORTABLE CHEST - 1 VIEW COMPARISON:  July 04, 2013 FINDINGS: Low lung volumes with bronchovascular crowding. Elevation of the right hemidiaphragm with streaky right basilar atelectasis. Biapical pleural thickening. No pneumothorax or pleural effusion. Mild cardiomegaly.Cardiac loop recorder device.No acute fracture or destructive lesion. Multilevel thoracic osteophytosis. IMPRESSION: No acute cardiopulmonary abnormality. Electronically Signed   By: Rogelia Myers M.D.   On: 12/22/2023 18:49   CT HEAD CODE STROKE WO CONTRAST Result Date: 12/22/2023 CLINICAL DATA:  Code stroke. Neuro deficit, acute, stroke suspected. Dysarthria. Right facial droop. EXAM: CT HEAD WITHOUT CONTRAST TECHNIQUE: Contiguous axial images were obtained from the base of the skull through the vertex without intravenous contrast. RADIATION DOSE REDUCTION: This exam was performed according to the departmental dose-optimization program which includes automated exposure control, adjustment of the mA and/or kV according to patient  size and/or use of iterative reconstruction technique. COMPARISON:  Head CT 05/16/2022. FINDINGS: Brain: Generalized cerebral atrophy. Large chronic cortical/subcortical right MCA/PCA territory infarct, increased in extent as compared to the prior head CT of 05/16/2022. Moderate-sized cortical/subcortical infarct within the  mid left frontal lobe (MCA territory), new from the prior CT but chronic in appearance. Redemonstrated chronic cortical/subcortical infarct within the left parietal lobe (MCA territory). Unchanged chronic lacunar infarct within/about the right basal ganglia. Unchanged chronic lacunar infarct within the left thalamus. An infarct within the right thalamus is new from the prior head CT, however, there is associated volume loss at this site and this appears chronic. Background patchy and ill-defined hypoattenuation within the cerebral white matter, nonspecific but compatible with chronic small vessel ischemic disease. Chronic infarcts within the bilateral cerebellar hemispheres, not appreciably changed. There is no acute intracranial hemorrhage. No acute demarcated cortical infarct is identified. No extra-axial fluid collection. No evidence of an intracranial mass. No midline shift. Vascular: No hyperdense vessel.  Atherosclerotic calcifications. Skull: No calvarial fracture or aggressive osseous lesion. Sinuses/Orbits: No mass or acute finding within the imaged orbits. No significant paranasal sinus disease at the imaged levels. Other: Small left mastoid effusion. Associated chronic sclerotic changes within left mastoid air cells. ASPECTS [facility] Stroke Program Early CT Score) - Ganglionic level infarction (caudate, lentiform nuclei, internal capsule, insula, M1-M3 cortex): 7 - Supraganglionic infarction (M4-M6 cortex): 3 Total score (0-10 with 10 being normal): 10 No evidence of an acute intracranial abnormality. These results were communicated to Dr. Merrianne at 6:26 pmon 10/4/2025by text page via the [facility] messaging system. IMPRESSION: 1. No evidence of an acute intracranial abnormality. 2. Parenchymal atrophy, chronic small vessel ischemic disease and chronic infarcts, as described. 3. Small left mastoid effusion. Associated chronic sclerotic changes within left mastoid air cells. Electronically Signed   By: Rockey Childs D.O.   On: 12/22/2023 18:27     Assessment and Plan: PAF:  ? Failed anticoagulation with xarelto  and now eliquis . He lives in assisted living and his meds are brought to him  I take what they bring me   Xrays suggest some lacunar infarct that would not be embolic. He is in NSR now. I think it would be best to change DOAC to Pradaxa as a direct thrombin inhibitor. Continue cardizem . Consider outpatient monitor and f/u with Dr Nancey from EP [facility]:  moderate by echo given age and multiple strokes not likely even a TAVR candidate

## 2023-12-24 NOTE — Plan of Care (Signed)

## 2023-12-24 NOTE — Progress Notes (Signed)
 PROGRESS NOTE    LOYDE ORTH  FMW:[id number] DOB: 1933/07/28 DOA: 12/22/2023 PCP: Sherre Rea, FNP     Brief Narrative:  Edward Maynard is a 89 y.o. male with medical history significant for hypertension, hyperlipidemia, history of CVA with residual left-sided deficits, CKD 2, atrial fibrillation on Eliquis , depression, and anxiety, now presenting with speech difficulty and right facial droop.   He has history of stroke from February 2024.  At that time, he was diagnosed with right middle cerebral artery stroke.  Xarelto  was switched to Eliquis  at that time.  He was discharged to [facility] after hospital stay.  He currently resides in an ALF.  Patient was in his usual state of health until yesterday evening when he developed dysarthria, right facial droop.  He was transferred to the hospital for stroke workup.  In the ED, he went into A Fib RVR and was started on IV cardizem  gtt.   New events last 24 hours / Subjective: Overnight, had episodes of SOB. He received neb treatments and placed on [place] O2. This morning, feeling better. He converted to NSR and now off Cardizem  gtt.   Assessment & Plan:   Principal Problem:   Acute focal neurological deficit Active Problems:   Hyperlipidemia   Paroxysmal atrial fibrillation (HCC)   History of stroke with current residual effects   Chronic a-fib (HCC)   CKD (chronic kidney disease)   CVA (cerebral vascular accident) (HCC)   Mass of right parotid gland   Pulmonary lesion, right   Depression   Acute left precentral gyrus and acute lacunar infarct right cerebral hemisphere  - History of right MCA CVA with residual left sided deficits - Echocardiogram EF 60 to 65% - Stroke team following - PT recommending home health on discharge - Eliquis , ?considering changing to Pradaxa   Multifocal pneumonia - CT chest showed right upper lobe and left lower lobe consolidation.  Started Rocephin - Recommended outpatient repeat imaging in 4-6 weeks to  ensure resolution of consolidation - Will add prednisone and nebs   A-fib RVR - Has history of paroxysmal A-fib.  On Eliquis  at baseline - Developed A-fib RVR in the emergency department, rate 130s.  Started Cardizem  gtt --> Now NSR and transitioned to PO Cardizem .  - Consult cardiology today for discharge medication recommendation and follow up, was previously seen by EP team outpatient   Hyperlipidemia - Lipitor, increase dose  Depression/anxiety - Prozac, Remeron , trazodone   CKD stage IIIa - Monitor, baseline Cr 1.2    Prediabetes - Ha1c 5.6 (previously 5.8)   Right parotid lesion - Follow-up outpatient  Ascending aortic dilatation at 4.5 cm - Recommend semi-annual imaging followup by CTA or MRA    DVT prophylaxis:  apixaban  (ELIQUIS ) tablet 5 mg  Code Status: DNR Family Communication: At bedside 10/5  Disposition Plan: ALF with home health Status is: Inpatient Remains inpatient appropriate because: IV rocephin       Antimicrobials:  Anti-infectives (From admission, onward)    Start     Dose/Rate Route Frequency Ordered Stop   12/23/23 0830  cefTRIAXone (ROCEPHIN) 2 g in sodium chloride  0.9 % 100 mL IVPB        2 g 200 mL/hr over 30 Minutes Intravenous Every 24 hours 12/23/23 0822          Objective: Vitals:   12/24/23 0122 12/24/23 0222 12/24/23 0322 12/24/23 0722  BP: 108/61 109/66 108/61   Pulse: 63  66   Resp: 16 18 20    Temp:  98 F (36.7 C) 97.6 F (36.4 C)  TempSrc:   Oral   SpO2: 92%  93%   Weight:      Height:        Intake/Output Summary (Last 24 hours) at 12/24/2023 0933 Last data filed at 12/23/2023 1826 Gross per 24 hour  Intake 340 ml  Output --  Net 340 ml   Filed Weights   12/22/23 1700 12/22/23 1810  Weight: 104.4 kg 104.4 kg    Examination:  General exam: Appears calm and comfortable  Respiratory system: Clear to auscultation. Respiratory effort normal. No respiratory distress. No conversational dyspnea.  On [place] O2   Cardiovascular system: S1 & S2 heard, RRR  Gastrointestinal system: Abdomen is nondistended, soft and nontender. Normal bowel sounds heard. Central nervous system: Alert and oriented. + Dysarthric speech, right lower facial droop.  Strength 5/5 all extremities, but diminished on left upper extremity which is chronic in nature Extremities: Symmetric in appearance  Psychiatry: Judgement and insight appear normal. Mood & affect appropriate.   Data Reviewed: I have personally reviewed following labs and imaging studies  CBC: Recent Labs  Lab 12/22/23 1745 12/22/23 1752 12/22/23 1826 12/23/23 0413  WBC  --  7.4  --  5.9  NEUTROABS  --  3.9  --   --   HGB 15.0 14.8 13.3 14.3  HCT 44.0 43.6 39.0 42.2  MCV  --  94.2  --  93.0  PLT  --  176  --  163   Basic Metabolic Panel: Recent Labs  Lab 12/22/23 1745 12/22/23 1752 12/22/23 1826 12/23/23 0413 12/24/23 0518  NA 139 136 139 138 139  K 3.6 3.4* 3.4* 4.1 4.1  CL 104 102  --  104 105  CO2  --  23  --  20* 22  GLUCOSE 197* 187*  --  206* 140*  BUN 21 19  --  18 27*  CREATININE 1.30* 1.27*  --  1.32* 1.53*  CALCIUM   --  8.8*  --  9.0 9.0   GFR: Estimated Creatinine Clearance: 42.5 mL/min (A) (by C-G formula based on SCr of 1.53 mg/dL (H)). Liver Function Tests: Recent Labs  Lab 12/22/23 1752  AST 20  ALT 17  ALKPHOS 145*  BILITOT 0.6  PROT 7.0  ALBUMIN 3.7   No results for input(s): LIPASE, AMYLASE in the last 168 hours. No results for input(s): AMMONIA in the last 168 hours. Coagulation Profile: Recent Labs  Lab 12/22/23 1752  INR 1.2   Cardiac Enzymes: No results for input(s): CKTOTAL, CKMB, CKMBINDEX, TROPONINI in the last 168 hours. BNP (last 3 results) No results for input(s): PROBNP in the last 8760 hours. HbA1C: Recent Labs    12/23/23 0413  HGBA1C 5.6   CBG: Recent Labs  Lab 12/22/23 1742  GLUCAP 197*   Lipid Profile: Recent Labs    12/23/23 0413  CHOL 127  HDL 43  LDLCALC  78  TRIG 29  CHOLHDL 3.0   Thyroid  Function Tests: No results for input(s): TSH, T4TOTAL, FREET4, T3FREE, THYROIDAB in the last 72 hours. Anemia Panel: No results for input(s): VITAMINB12, FOLATE, FERRITIN, TIBC, IRON, RETICCTPCT in the last 72 hours. Sepsis Labs: No results for input(s): PROCALCITON, LATICACIDVEN in the last 168 hours.  Recent Results (from the past 240 hours)  Resp panel by RT-PCR (RSV, Flu A&B, Covid) Anterior Nasal Swab     Status: None   Collection Time: 12/22/23  5:55 PM   Specimen: Anterior Nasal Swab  Result  Value Ref Range Status   SARS Coronavirus 2 by RT PCR NEGATIVE NEGATIVE Final   Influenza A by PCR NEGATIVE NEGATIVE Final   Influenza B by PCR NEGATIVE NEGATIVE Final    Comment: (NOTE) The Xpert Xpress SARS-CoV-2/FLU/RSV plus assay is intended as an aid in the diagnosis of influenza from Nasopharyngeal swab specimens and should not be used as a sole basis for treatment. Nasal washings and aspirates are unacceptable for Xpert Xpress SARS-CoV-2/FLU/RSV testing.  Fact Sheet for Patients: BloggerCourse.com  Fact Sheet for Healthcare Providers: SeriousBroker.it  This test is not yet approved or cleared by the [place]  FDA and has been authorized for detection and/or diagnosis of SARS-CoV-2 by FDA under an Emergency Use Authorization (EUA). This EUA will remain in effect (meaning this test can be used) for the duration of the COVID-19 declaration under Section 564(b)(1) of the Act, 21 U.S.C. section 360bbb-3(b)(1), unless the authorization is terminated or revoked.     Resp Syncytial Virus by PCR NEGATIVE NEGATIVE Final    Comment: (NOTE) Fact Sheet for Patients: BloggerCourse.com  Fact Sheet for Healthcare Providers: SeriousBroker.it  This test is not yet approved or cleared by the [place]  FDA and has been  authorized for detection and/or diagnosis of SARS-CoV-2 by FDA under an Emergency Use Authorization (EUA). This EUA will remain in effect (meaning this test can be used) for the duration of the COVID-19 declaration under Section 564(b)(1) of the Act, 21 U.S.C. section 360bbb-3(b)(1), unless the authorization is terminated or revoked.  Performed at [facility], [address] [place], [place] [address]       Radiology Studies: ECHOCARDIOGRAM COMPLETE Result Date: 12/23/2023    ECHOCARDIOGRAM REPORT   Patient Name:   Edward Maynard Date of Exam: 12/23/2023 Medical Rec #:  [id number]     Height:       72.0 in Accession #:    [id number]    Weight:       230.2 lb Date of Birth:  07-25-1933    BSA:          2.262 m Patient Age:    89 years      BP:           138/72 mmHg Patient Gender: M             HR:           82 bpm. Exam Location:  Inpatient Procedure: 2D Echo (Both Spectral and Color Flow Doppler were utilized during            procedure). Indications:    stroke  History:        Patient has prior history of Echocardiogram examinations, most                 recent 05/10/2022. Chronic kidney disease, Arrythmias:Atrial                 Fibrillation and PVC; Risk Factors:Dyslipidemia.  Sonographer:    Tinnie Barefoot RDCS Referring Phys: [id number] TIMOTHY S OPYD IMPRESSIONS  1. Left ventricular ejection fraction, by estimation, is 60 to 65%. The left ventricle has normal function. The left ventricle has no regional wall motion abnormalities. Left ventricular diastolic parameters were normal.  2. Right ventricular systolic function is normal. The right ventricular size is normal.  3. The mitral valve is normal in structure. Trivial mitral valve regurgitation. No evidence of mitral stenosis.  4. DVI 0.31. The aortic valve is normal in structure. Aortic valve regurgitation  is mild. Moderate aortic valve stenosis. Aortic valve area, by VTI measures 1.08 cm. Aortic valve mean gradient measures 24.0 mmHg.  Aortic valve Vmax measures 3.17 m/s.  5. The inferior vena cava not well visualized. FINDINGS  Left Ventricle: Left ventricular ejection fraction, by estimation, is 60 to 65%. The left ventricle has normal function. The left ventricle has no regional wall motion abnormalities. The left ventricular internal cavity size was normal in size. There is  no left ventricular hypertrophy. Left ventricular diastolic parameters were normal. Right Ventricle: The right ventricular size is normal. No increase in right ventricular wall thickness. Right ventricular systolic function is normal. Left Atrium: Left atrial size was normal in size. Right Atrium: Right atrial size was normal in size. Pericardium: There is no evidence of pericardial effusion. Mitral Valve: The mitral valve is normal in structure. Trivial mitral valve regurgitation. No evidence of mitral valve stenosis. Tricuspid Valve: The tricuspid valve is normal in structure. Tricuspid valve regurgitation is not demonstrated. No evidence of tricuspid stenosis. Aortic Valve: DVI 0.31. The aortic valve is normal in structure. Aortic valve regurgitation is mild. Aortic regurgitation PHT measures 278 msec. Moderate aortic stenosis is present. Aortic valve mean gradient measures 24.0 mmHg. Aortic valve peak gradient measures 40.2 mmHg. Aortic valve area, by VTI measures 1.08 cm. Pulmonic Valve: The pulmonic valve was not well visualized. Pulmonic valve regurgitation is not visualized. No evidence of pulmonic stenosis. Aorta: Aortic root could not be assessed. Venous: The inferior vena cava not well visualized. IAS/Shunts: No atrial level shunt detected by color flow Doppler.  LEFT VENTRICLE PLAX 2D LVIDd:         5.80 cm   Diastology LVIDs:         3.80 cm   LV e' medial:    5.55 cm/s LV PW:         1.20 cm   LV E/e' medial:  14.4 LV IVS:        1.40 cm   LV e' lateral:   11.50 cm/s LVOT diam:     2.10 cm   LV E/e' lateral: 6.9 LV SV:         70 LV SV Index:   31 LVOT Area:      3.46 cm  RIGHT VENTRICLE RV Basal diam:  3.20 cm RV S prime:     13.30 cm/s TAPSE (M-mode): 2.4 cm LEFT ATRIUM             Index        RIGHT ATRIUM           Index LA diam:        5.40 cm 2.39 cm/m   RA Area:     14.00 cm LA Vol (A2C):   65.4 ml 28.92 ml/m  RA Volume:   35.10 ml  15.52 ml/m LA Vol (A4C):   60.4 ml 26.71 ml/m LA Biplane Vol: 63.1 ml 27.90 ml/m  AORTIC VALVE AV Area (Vmax):    1.09 cm AV Area (Vmean):   1.03 cm AV Area (VTI):     1.08 cm AV Vmax:           317.00 cm/s AV Vmean:          228.000 cm/s AV VTI:            0.654 m AV Peak Grad:      40.2 mmHg AV Mean Grad:      24.0 mmHg LVOT Vmax:  100.00 cm/s LVOT Vmean:        68.000 cm/s LVOT VTI:          0.203 m LVOT/AV VTI ratio: 0.31 AI PHT:            278 msec  AORTA Ao Root diam: 3.20 cm MR Peak grad: 144.5 mmHg MR Mean grad: 87.0 mmHg    SHUNTS MR Vmax:      601.00 cm/s  Systemic VTI:  0.20 m MR Vmean:     432.0 cm/s   Systemic Diam: 2.10 cm MV E velocity: 79.80 cm/s MV A velocity: 81.90 cm/s MV E/A ratio:  0.97 Franck Azobou Tonleu Electronically signed by Joelle Cedars Tonleu Signature Date/Time: 12/23/2023/12:59:02 PM    Final    MR BRAIN WO CONTRAST Result Date: 12/23/2023 EXAM: MRI BRAIN WITHOUT CONTRAST 12/23/2023 05:36:44 AM TECHNIQUE: Multiplanar multisequence MRI of the head/brain was performed without the administration of intravenous contrast. COMPARISON: MRI of the head dated 05/10/2022. CLINICAL HISTORY: Neuro deficit, acute, stroke suspected. FINDINGS: BRAIN AND VENTRICLES: Acute cortical infarct present within the cortex of the left precentral gyrus seen on image 86 of series 5. Acute lacunar infarct within the right cerebellar hemisphere seen on image 62. There are chronic encephalomalacia changes within the right parietal, temporal, and occipital lobes secondary to the infarct noted on the previous study. There are also chronic encephalomalacia changes within the frontal lobes bilaterally. There is  moderate generalized cerebral volume loss and advanced periventricular and deep cerebral white matter disease. No intracranial hemorrhage. No mass. No midline shift. No hydrocephalus. The sella is unremarkable. Normal flow voids. ORBITS: No acute abnormality. SINUSES AND MASTOIDS: No acute abnormality. BONES AND SOFT TISSUES: Normal marrow signal. No acute soft tissue abnormality. IMPRESSION: 1. Acute cortical infarct in the left precentral gyrus and acute lacunar infarct in the right cerebellar hemisphere. 2. Chronic encephalomalacia in the right parietal, temporal, and occipital lobes, and in the frontal lobes bilaterally. 3. Moderate generalized cerebral volume loss and advanced periventricular and deep cerebral white matter disease. Electronically signed by: Evalene Coho MD 12/23/2023 10:25 AM EDT RP Workstation: [id number]   CT CHEST W CONTRAST Result Date: 12/22/2023 CLINICAL DATA:  Provided history: Respiratory illness, nondiagnostic xray EXAM: CT CHEST WITH CONTRAST TECHNIQUE: Multidetector CT imaging of the chest was performed during intravenous contrast administration. RADIATION DOSE REDUCTION: This exam was performed according to the departmental dose-optimization program which includes automated exposure control, adjustment of the mA and/or kV according to patient size and/or use of iterative reconstruction technique. CONTRAST:  75mL OMNIPAQUE  IOHEXOL  350 MG/ML SOLN COMPARISON:  Radiograph earlier today FINDINGS: Cardiovascular: The heart is normal in size. No pericardial effusion. Coronary artery calcifications. Irregular plaque throughout the thoracic aorta. The ascending aorta is dilated at 4.5 cm. No dissection or acute aortic finding. No central pulmonary embolus on this non dedicated exam. Mediastinum/Nodes: Shotty mediastinal and hilar lymph nodes, all subcentimeter short axis. Decompressed esophagus. No thyroid  nodule. Lungs/Pleura: Elevated right hemidiaphragm. Adjacent airspace disease  in the right lower and middle lobe likely compressive atelectasis. Moderate emphysema. Dependent ground-glass and consolidative opacity in the right upper lobe abutting the fissure and peripherally. Nodular areas of airspace disease in the left lower lobe, series 4, image 121. For example elongated nodular consolidation measures 4 x 1.3 cm, series 4, image 121. No pleural effusion. Upper Abdomen: Elevated right hemidiaphragm. Bilateral renal cysts need no further imaging follow-up. Excreted IV contrast in the renal collecting systems from prior head and neck CTA. Musculoskeletal: Diffuse thoracic  spondylosis. There are no acute or suspicious osseous abnormalities. IMPRESSION: 1. Dependent ground-glass and consolidative opacity in the right upper lobe abutting the fissure and peripherally, suspicious for pneumonia. 2. Nodular areas of airspace disease in the left lower lobe, largest measuring 4 x 1.3 cm also suspicious for infectio; however neoplasm is not excluded. Recommend follow-up CT in 4-6 weeks after course of treatment. 3. Elevated right hemidiaphragm with adjacent airspace disease in the right lower and middle lobes, likely compressive atelectasis. 4. Ascending aortic dilatation at 4.5 cm. Recommend semi-annual imaging followup by CTA or MRA and referral to cardiothoracic surgery if not already obtained. This recommendation follows 2010 ACCF/AHA/AATS/ACR/ASA/SCA/SCAI/SIR/STS/SVM Guidelines for the Diagnosis and Management of Patients With Thoracic Aortic Disease. Circulation. 2010; 121: Z733-z630. Aortic aneurysm NOS (ICD10-I71.9) Aortic Atherosclerosis (ICD10-I70.0) and Emphysema (ICD10-J43.9). Electronically Signed   By: Andrea Gasman M.D.   On: 12/22/2023 22:03   CT ANGIO HEAD NECK W WO CM (CODE STROKE) Addendum Date: 12/22/2023 ADDENDUM REPORT: 12/22/2023 19:11 ADDENDUM: CTA head impression #1 called by telephone at the time of interpretation on 12/22/2023 at 7:08 pm to provider Dr. Jakie, who  verbally acknowledged these results. The progressive focus of ulcerated plaque at the distal right common carotid artery was also discussed at this time. Electronically Signed   By: Rockey Childs D.O.   On: 12/22/2023 19:11   Result Date: 12/22/2023 CLINICAL DATA:  Provided history: Neuro deficit, acute, stroke suspected. EXAM: CT ANGIOGRAPHY HEAD AND NECK WITH AND WITHOUT CONTRAST TECHNIQUE: Multidetector CT imaging of the head and neck was performed using the standard protocol during bolus administration of intravenous contrast. Multiplanar CT image reconstructions and MIPs were obtained to evaluate the vascular anatomy. Carotid stenosis measurements (when applicable) are obtained utilizing NASCET criteria, using the distal internal carotid diameter as the denominator. RADIATION DOSE REDUCTION: This exam was performed according to the departmental dose-optimization program which includes automated exposure control, adjustment of the mA and/or kV according to patient size and/or use of iterative reconstruction technique. CONTRAST:  75mL OMNIPAQUE  IOHEXOL  350 MG/ML SOLN COMPARISON:  Noncontrast head CT performed earlier today 12/22/2023. MRA head 05/10/2022. CT angiogram head/neck 05/10/2022. FINDINGS: CTA NECK FINDINGS Aortic arch: Standard aortic branching. Atherosclerotic plaque within the visualized aortic arch and proximal major branch vessels of the neck. Streak/beam hardening artifact arising from a dense contrast bolus partially obscures the left subclavian artery. Within this limitation, there is no appreciable hemodynamically significant innominate or proximal subclavian artery stenosis. Right carotid system: CCA and ICA patent within the neck. Atherosclerotic plaque. Muscle the, there is an sclerotic plaque at the carotid bifurcation and within the proximal ICA resulting in less than 50% stenosis. Progressive focus of ulcerated plaque anteriorly at the distal common carotid artery, now measuring 4 mm  (series 5, image 230). Partially retropharyngeal course of the cervical ICA. Left carotid system: CCA and ICA patent within the neck. Atherosclerotic plaque. Most notably, progressive (mildly calcified) plaque within the proximal ICA results in an estimated 60-70% stenosis which has progressed (series 5, images 214 and 215). Vertebral arteries: Patent within the neck. Severe stenosis at the right vertebral artery origin, unchanged. Atherosclerotic plaque within the right vertebral artery at the V3/V4 junction with up to moderate stenosis. The left vertebral artery is non dominant and developmentally diminutive, but patent throughout the neck. Atherosclerotic plaque at the left vertebral artery origin. Skeleton: The patient is edentulous. Spondylosis of the cervical and visible thoracic levels. No acute fracture or aggressive osseous lesion. Other neck: 12 mm ovoid nodule  again demonstrated within the superficial lobe of the right parotid gland. This is suspicious for a primary parotid neoplasm. Upper chest: Incompletely imaged cystic or cavitary lesion within the right lower lobe (measuring at least 4.6 cm (series 5, image 344). Mild dependent atelectasis within the bilateral upper lobes and right lower lobe at the imaged levels. Review of the MIP images confirms the above findings CTA HEAD FINDINGS Anterior circulation: The intracranial internal carotid arteries are patent. The right MCA proximal M2 branch which was occluded on the prior CTA of 05/10/2022 is occluded more proximally on today's study. Additionally, there is a new severe stenosis within this vessel immediately upstream. Additional atherosclerotic irregularity of more distal M2 right MCA vessels. The left middle cerebral artery M1 segment is patent. No left M2 proximal branch occlusion or high-grade proximal stenosis is identified. The anterior cerebral arteries are patent. No intracranial aneurysm is identified. Posterior circulation: The  intracranial vertebral arteries are patent. Progressive atherosclerotic plaque within the right vertebral artery at the V3/V4 junction now with up to moderate stenosis. The basilar artery is patent. The posterior cerebral arteries are patent. Atherosclerotic irregularity of both vessels without high-grade proximal stenosis. Fetal origin right PCA. The left posterior communicating artery is diminutive or absent. Venous sinuses: Limited assessment for dural venous sinus thrombosis due to contrast timing. Anatomic variants: As described. Review of the MIP images confirms the above findings Attempts are being made to reach the ordering provider at this time. IMPRESSION: CTA neck: 1. The common carotid and internal carotid arteries are patent within the neck. Atherosclerotic plaque bilaterally, as described. Progressive atherosclerotic plaque within the proximal left internal carotid artery now with an estimated 60-70% stenosis at this site. Progressive focus of ulcerated plaque anteriorly at the distal right common carotid artery. 2. The right vertebral artery is dominant and patent within the neck. Unchanged severe atherosclerotic narrowing at the right vertebral artery origin. Progressive atherosclerotic plaque within the right vertebral artery at the V3/V4 junction resulting in up to moderate stenosis. 3. The left vertebral artery is non-dominant and developmentally diminutive, but patent throughout the neck. 4. Aortic Atherosclerosis (ICD10-I70.0). 5. Partially imaged cystic or cavitary pulmonary lesion within the right lower lobe. A dedicated chest CT is recommended for further characterization. 6. Unchanged 12 mm right parotid gland nodule suspicious for a primary parotid neoplasm. ENT follow-up recommended. CTA head: 1. The right middle cerebral artery proximal M2 branch which was occluded on the prior CTA of 05/10/2022 is occluded more proximally on today's study. Additionally, there is a new severe stenosis  within this vessel immediately upstream. 2. No proximal intracranial large vessel occlusion identified elsewhere. 3. Background intracranial atherosclerotic disease as described. Electronically Signed: By: Rockey Childs D.O. On: 12/22/2023 19:06   DG Chest Portable 1 View Result Date: 12/22/2023 CLINICAL DATA:  Shortness of breath EXAM: PORTABLE CHEST - 1 VIEW COMPARISON:  July 04, 2013 FINDINGS: Low lung volumes with bronchovascular crowding. Elevation of the right hemidiaphragm with streaky right basilar atelectasis. Biapical pleural thickening. No pneumothorax or pleural effusion. Mild cardiomegaly.Cardiac loop recorder device.No acute fracture or destructive lesion. Multilevel thoracic osteophytosis. IMPRESSION: No acute cardiopulmonary abnormality. Electronically Signed   By: Rogelia Myers M.D.   On: 12/22/2023 18:49   CT HEAD CODE STROKE WO CONTRAST Result Date: 12/22/2023 CLINICAL DATA:  Code stroke. Neuro deficit, acute, stroke suspected. Dysarthria. Right facial droop. EXAM: CT HEAD WITHOUT CONTRAST TECHNIQUE: Contiguous axial images were obtained from the base of the skull through the vertex without intravenous  contrast. RADIATION DOSE REDUCTION: This exam was performed according to the departmental dose-optimization program which includes automated exposure control, adjustment of the mA and/or kV according to patient size and/or use of iterative reconstruction technique. COMPARISON:  Head CT 05/16/2022. FINDINGS: Brain: Generalized cerebral atrophy. Large chronic cortical/subcortical right MCA/PCA territory infarct, increased in extent as compared to the prior head CT of 05/16/2022. Moderate-sized cortical/subcortical infarct within the mid left frontal lobe (MCA territory), new from the prior CT but chronic in appearance. Redemonstrated chronic cortical/subcortical infarct within the left parietal lobe (MCA territory). Unchanged chronic lacunar infarct within/about the right basal ganglia.  Unchanged chronic lacunar infarct within the left thalamus. An infarct within the right thalamus is new from the prior head CT, however, there is associated volume loss at this site and this appears chronic. Background patchy and ill-defined hypoattenuation within the cerebral white matter, nonspecific but compatible with chronic small vessel ischemic disease. Chronic infarcts within the bilateral cerebellar hemispheres, not appreciably changed. There is no acute intracranial hemorrhage. No acute demarcated cortical infarct is identified. No extra-axial fluid collection. No evidence of an intracranial mass. No midline shift. Vascular: No hyperdense vessel.  Atherosclerotic calcifications. Skull: No calvarial fracture or aggressive osseous lesion. Sinuses/Orbits: No mass or acute finding within the imaged orbits. No significant paranasal sinus disease at the imaged levels. Other: Small left mastoid effusion. Associated chronic sclerotic changes within left mastoid air cells. ASPECTS [facility] Stroke Program Early CT Score) - Ganglionic level infarction (caudate, lentiform nuclei, internal capsule, insula, M1-M3 cortex): 7 - Supraganglionic infarction (M4-M6 cortex): 3 Total score (0-10 with 10 being normal): 10 No evidence of an acute intracranial abnormality. These results were communicated to Dr. Merrianne at 6:26 pmon 10/4/2025by text page via the [facility] messaging system. IMPRESSION: 1. No evidence of an acute intracranial abnormality. 2. Parenchymal atrophy, chronic small vessel ischemic disease and chronic infarcts, as described. 3. Small left mastoid effusion. Associated chronic sclerotic changes within left mastoid air cells. Electronically Signed   By: Rockey Childs D.O.   On: 12/22/2023 18:27      Scheduled Meds:   stroke: early stages of recovery book   Does not apply Once   apixaban   5 mg Oral BID   atorvastatin   80 mg Oral Daily   diltiazem   120 mg Oral Daily   FLUoxetine  10 mg Oral Daily   folic  acid  0.5 mg Oral Daily   mirtazapine   7.5 mg Oral QHS   traZODone   50 mg Oral QHS   Continuous Infusions:  cefTRIAXone (ROCEPHIN)  IV Stopped (12/23/23 0950)     LOS: 1 day   Time spent: 25 minutes   Delon Hoe, DO [facility] 12/24/2023, 9:33 AM   Available via Epic secure chat 7am-7pm After these hours, please refer to coverage provider listed on [email or web address]

## 2023-12-24 NOTE — Progress Notes (Signed)
 Patient c/o SOB during my shift.  Resp called and patient was given 2 neb treatments.  Patient was also given an IS and instructed on how to use it.  He is currently on 2L [place] with an SPO2 of 93 and above.  Patient was anxious about not being able to cough up his phlegm.  He was given Atarax  to calm down and Musinex to help break up the phlegm.   Patient is currently resting comfortably in NAD.

## 2023-12-25 DIAGNOSIS — I480 Paroxysmal atrial fibrillation: Secondary | ICD-10-CM | POA: Diagnosis not present

## 2023-12-25 DIAGNOSIS — Z5181 Encounter for therapeutic drug level monitoring: Secondary | ICD-10-CM

## 2023-12-25 DIAGNOSIS — R29818 Other symptoms and signs involving the nervous system: Secondary | ICD-10-CM | POA: Diagnosis not present

## 2023-12-25 DIAGNOSIS — Z7901 Long term (current) use of anticoagulants: Secondary | ICD-10-CM | POA: Diagnosis not present

## 2023-12-25 LAB — BASIC METABOLIC PANEL WITH GFR
Anion gap: 10 (ref 5–15)
BUN: 32 mg/dL — ABNORMAL HIGH (ref 8–23)
CO2: 23 mmol/L (ref 22–32)
Calcium: 8.9 mg/dL (ref 8.9–10.3)
Chloride: 105 mmol/L (ref 98–111)
Creatinine, Ser: 1.34 mg/dL — ABNORMAL HIGH (ref 0.61–1.24)
GFR, Estimated: 51 mL/min — ABNORMAL LOW (ref >60)
Glucose, Bld: 152 mg/dL — ABNORMAL HIGH (ref 70–99)
Potassium: 4 mmol/L (ref 3.5–5.1)
Sodium: 138 mmol/L (ref 135–145)

## 2023-12-25 MED ORDER — ATORVASTATIN CALCIUM 80 MG PO TABS: 80.0000 mg | ORAL_TABLET | Freq: Every day | ORAL | 0 refills | Status: AC

## 2023-12-25 MED ORDER — PREDNISONE 50 MG PO TABS: 50.0000 mg | ORAL_TABLET | Freq: Every day | ORAL | 0 refills | Status: AC

## 2023-12-25 MED ORDER — DILTIAZEM HCL ER COATED BEADS 120 MG PO CP24: 120.0000 mg | ORAL_CAPSULE | Freq: Every day | ORAL | 0 refills | Status: AC

## 2023-12-25 MED ORDER — CEFDINIR 300 MG PO CAPS: 300.0000 mg | ORAL_CAPSULE | Freq: Two times a day (BID) | ORAL | 0 refills | Status: AC

## 2023-12-25 MED ORDER — AMIODARONE HCL 100 MG PO TABS: 100.0000 mg | ORAL_TABLET | Freq: Every day | ORAL | 0 refills | Status: DC

## 2023-12-25 MED ORDER — AMIODARONE HCL 200 MG PO TABS
100.0000 mg | ORAL_TABLET | Freq: Every day | ORAL | Status: DC
  Administered 2023-12-25: 100 mg via ORAL
  Filled 2023-12-25: qty 1

## 2023-12-25 NOTE — Discharge Instructions (Signed)

## 2023-12-25 NOTE — NC FL2 (Signed)
   MEDICAID [id number] LEVEL OF CARE FORM     IDENTIFICATION  Patient Name: Edward Maynard Birthdate: 11-22-1933 Sex: male Admission Date (Current Location): 12/22/2023  [facility] and [place] Number:  Producer, television/film/video and Address:  [facility] [place], 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   Pulmonary lesion, right 12/22/2023   Depression 12/22/2023   Shoulder impingement syndrome, right 08/10/2022   CKD (chronic kidney disease) 05/18/2022   Insomnia 05/18/2022   CVA (cerebral vascular accident) (HCC) 05/18/2022   Essential hypertension 05/16/2022   Obesity 05/16/2022   Advanced age 53/27/2024   Dysphagia 05/16/2022   History of stroke with current residual effects 05/10/2022   Middle cerebral artery embolism, 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   Chronic a-fib (HCC) 05/10/2022   Paroxysmal atrial fibrillation (HCC) 01/18/2015   Chronic anticoagulation 01/18/2015   PVC's (premature ventricular contractions) 12/14/2014   NSVT (nonsustained ventricular tachycardia) (HCC) 12/14/2014   Cerebral infarction due to embolism of cerebral artery (HCC) 07/10/2014   Hyperlipidemia 12/12/2013   History of colonic polyps 10/23/2013    Orientation RESPIRATION BLADDER Height & Weight     Self, Place  Normal Incontinent Weight: 230 lb 2.6 oz (104.4 kg) Height:  6' (182.9 cm)  BEHAVIORAL SYMPTOMS/MOOD NEUROLOGICAL BOWEL NUTRITION STATUS      Incontinent Diet (see DC summary)  AMBULATORY STATUS  COMMUNICATION OF NEEDS Skin   Extensive Assist Verbally Normal                       Personal Care Assistance Level of Assistance  Bathing, Feeding, Dressing Bathing Assistance: Maximum assistance Feeding assistance: Limited assistance Dressing Assistance: Maximum assistance     Functional Limitations Info  Speech     Speech Info: Impaired (dysarthria, expressive aphasia)    SPECIAL CARE FACTORS FREQUENCY  PT (By licensed PT), OT (By licensed OT), Speech therapy     PT Frequency: 3x/wk with HH OT Frequency: 3x/wk with HH     Speech Therapy Frequency: 3x/wk with HH      Contractures Contractures Info: Not present    Additional Factors Info  Code Status, Allergies Code Status Info: DNR Allergies Info: NKA             Discharge Medications: STOP taking these medications     amLODipine  10 MG tablet Commonly known as: NORVASC            TAKE these medications     acetaminophen  325 MG tablet Commonly known as: TYLENOL  Take 2 tablets (650 mg total) by mouth every 4 (four) hours as needed for mild pain (or temp > 37.5 C (99.5 F)).    amiodarone 100 MG tablet Commonly known as: PACERONE Take 1 tablet (100 mg total) by mouth daily. Start taking on: December 26, 2023    apixaban  5 MG Tabs tablet Commonly known as: ELIQUIS  Take 1 tablet (5 mg total) by mouth 2 (two) times daily.    AQUAPHOR OINTMENT  BODY EX Apply 1 application  topically daily. To bilateral extremities    atorvastatin  80 MG tablet Commonly known as: LIPITOR Take 1 tablet (80 mg total) by mouth daily. Start taking on: December 26, 2023 What changed:  medication strength how much to take    cefdinir 300 MG capsule Commonly known as: OMNICEF Take 1 capsule (300 mg total) by mouth 2 (two) times daily for 2 days. Start taking on: December 26, 2023    Cholecalciferol 25 MCG (1000 UT) capsule Take 1,000 Units by mouth daily.    diltiazem  120 MG 24 hr capsule Commonly known as: CARDIZEM   CD Take 1 capsule (120 mg total) by mouth daily. Start taking on: December 26, 2023    docusate sodium  100 MG capsule Commonly known as: COLACE Take 1 capsule (100 mg total) by mouth daily as needed for mild constipation.    FLUoxetine 10 MG capsule Commonly known as: PROZAC Take 10 mg by mouth daily.    folic acid  400 MCG tablet Commonly known as: FOLVITE  Take 400 mcg by mouth daily.    hydrocortisone 1 % ointment Apply 1 Application topically 3 (three) times daily as needed for itching.    hydrOXYzine  25 MG tablet Commonly known as: ATARAX  Take 25 mg by mouth every 6 (six) hours as needed for anxiety or itching.    mirtazapine  7.5 MG tablet Commonly known as: REMERON  Take 7.5 mg by mouth at bedtime.    predniSONE 50 MG tablet Commonly known as: DELTASONE Take 1 tablet (50 mg total) by mouth daily with breakfast for 3 days. Start taking on: December 26, 2023    traZODone  50 MG tablet Commonly known as: DESYREL  Take 50 mg by mouth at bedtime.    triamcinolone  cream 0.1 % Commonly known as: KENALOG  Apply 1 Application topically 2 (two) times daily as needed (rash). Affected areas of leg    Relevant Imaging Results:  Relevant Lab Results:   Additional Information SS#: [id number]  Almarie CHRISTELLA Goodie, LCSW

## 2023-12-25 NOTE — TOC Transition Note (Signed)
 Transition of Care [facility]) - Discharge Note   Patient Details  Name: Edward Maynard MRN: [id number] Date of Birth: 1934-03-19  [facility]) CM/SW Contact:  Almarie CHRISTELLA Goodie, LCSW Phone Number: 12/25/2023, 12:46 PM   Clinical Narrative:   CSW updated by MD that patient stable to return to ALF. CSW completed paperwork, sent to [name], confirmed receipt. [name] utilizes [employer] for [employer], referral given for PT, OT, and SLP. CSW discussed with [name] about recommendation for wheelchair attachment, they can arrange the DME if order is sent. CSW sent order for attachment to [name]. CSW spoke with daughter, [name], to discuss, she is in agreement but asked to speak with MD. Transport arranged with [facility] after MD spoke with daughter about discharge.  Nurse to call report to [phone].    Final next level of care: Assisted Living Barriers to Discharge: Barriers Resolved   Patient Goals and CMS Choice Patient states their goals for this hospitalization and ongoing recovery are:: patient unable to participate in goal setting, not fully oriented CMS Medicare.gov Compare Post Acute Care list provided to:: Patient Represenative (must comment) Choice offered to / list presented to : Adult Children [place] ownership interest in [facility].provided to:: Adult Children    Discharge Placement                Patient to be transferred to facility by: [facility] Name of family member notified: [name] Patient and family notified of of transfer: 12/25/23  Discharge Plan and Services Additional resources added to the After Visit Summary for       Post Acute Care Choice: Home Health, Durable Medical Equipment          DME Arranged: Other see comment (wheelchair attachment) DME Agency: Other - Comment     Representative spoke with at DME Agency: [name] to arrange [facility] Arranged: PT, OT, Speech Therapy HH Agency: [facility] Date [facility] Agency  Contacted: 12/25/23   Representative spoke with at [facility] Agency: [facility]  Social Drivers of Health (SDOH) Interventions SDOH Screenings   Food Insecurity: No Food Insecurity (12/22/2023)  Housing: Low Risk  (12/22/2023)  Transportation Needs: No Transportation Needs (12/22/2023)  Utilities: Not At Risk (12/22/2023)  Depression (PHQ2-9): Low Risk  (11/10/2022)  Social Connections: Moderately Integrated (12/22/2023)  Tobacco Use: Medium Risk (12/22/2023)     Readmission Risk Interventions     No data to display

## 2023-12-25 NOTE — Discharge Summary (Signed)
 Physician Discharge Summary  GERAN HAITHCOCK FMW:[id number] DOB: [birth year] DOA: 12/22/2023  PCP: Sherre Rea, FNP  Admit date: 12/22/2023 Discharge date: 12/25/2023  Admitted From: ALF Disposition:  ALF   Recommendations for Outpatient Follow-up:  Follow up with PCP -please see below for outpatient imaging follow-up including repeat chest CT and right parotid lesion and ascending aorta dilatation Follow-up with cardiology for A-fib Follow-up with neurology in 2 months  Discharge Condition: Stable CODE STATUS: DNR Diet recommendation: Heart healthy  Brief/Interim Summary: MORDECAI TINDOL is a 89 y.o. male with medical history significant for hypertension, hyperlipidemia, history of CVA with residual left-sided deficits, CKD 2, atrial fibrillation on Eliquis , depression, and anxiety, now presenting with speech difficulty and right facial droop.    He has history of stroke from February 2024.  At that time, he was diagnosed with right middle cerebral artery stroke.  Xarelto  was switched to Eliquis  at that time.  He was discharged to [facility] after hospital stay.  He currently resides in an ALF.   Patient was in his usual state of health until yesterday evening when he developed dysarthria, right facial droop.  He was transferred to the hospital for stroke workup.  Neurology consulted.  Patient recommended to continue Eliquis  as insurance did not cover Pradaxa.   In the ED, he went into A Fib RVR and was started on IV cardizem  gtt. cardiology consulted.  A-fib RVR resolved and patient converted to normal sinus rhythm.  Cardizem  was switched to p.o.  During his hospitalization, he was also diagnosed with multifocal pneumonia and was started on steroids and antibiotics.  He was weaned off oxygen and remained stable on room air prior to discharge home.    Discharge Diagnoses:  Principal Problem:   Acute focal neurological deficit Active Problems:   Hyperlipidemia   Paroxysmal atrial fibrillation  (HCC)   History of stroke with current residual effects   Chronic a-fib (HCC)   CKD (chronic kidney disease)   CVA (cerebral vascular accident) (HCC)   Mass of right parotid gland   Pulmonary lesion, right   Depression    Acute left precentral gyrus and acute lacunar infarct right cerebral hemisphere  - History of right MCA CVA with residual left sided deficits - Echocardiogram EF 60 to 65% - Stroke team following - PT recommending home health on discharge - Eliquis , insurance did not cover Pradaxa   Multifocal pneumonia - CT chest showed right upper lobe and left lower lobe consolidation.  Started Rocephin --> Cefdinir to complete total 5 day course  - Recommended outpatient repeat imaging in 4-6 weeks to ensure resolution of consolidation - Prednisone burst x 5 days    A-fib RVR - Has history of paroxysmal A-fib.  On Eliquis  at baseline - Developed A-fib RVR in the emergency department, rate 130s.  Started Cardizem  gtt --> Now NSR and transitioned to PO Cardizem .  Amiodarone added. - Cardiology consulted.  Follow-up outpatient   Hyperlipidemia - Lipitor   Depression/anxiety - Prozac, Remeron , trazodone    CKD stage IIIa - Monitor, baseline Cr 1.2     Prediabetes - Ha1c 5.6 (previously 5.8)    Right parotid lesion - Follow-up outpatient   Ascending aortic dilatation at 4.5 cm - Recommend semi-annual imaging followup by CTA or MRA   Discharge Instructions  Discharge Instructions     Call MD for:  difficulty breathing, headache or visual disturbances   Complete by: As directed    Call MD for:  extreme fatigue   Complete  by: As directed    Call MD for:  persistant dizziness or light-headedness   Complete by: As directed    Call MD for:  persistant nausea and vomiting   Complete by: As directed    Call MD for:  severe uncontrolled pain   Complete by: As directed    Call MD for:  temperature >100.4   Complete by: As directed    Diet - low sodium heart healthy    Complete by: As directed    Discharge instructions   Complete by: As directed    You were cared for by a hospitalist during your hospital stay. If you have any questions about your discharge medications or the care you received while you were in the hospital after you are discharged, you can call the unit and ask to speak with the hospitalist on call if the hospitalist that took care of you is not available. Once you are discharged, your primary care physician will handle any further medical issues. Please note that NO REFILLS for any discharge medications will be authorized once you are discharged, as it is imperative that you return to your primary care physician (or establish a relationship with a primary care physician if you do not have one) for your aftercare needs so that they can reassess your need for medications and monitor your lab values.   Increase activity slowly   Complete by: As directed       Allergies as of 12/25/2023   No Known Allergies      Medication List     STOP taking these medications    amLODipine  10 MG tablet Commonly known as: NORVASC        TAKE these medications    acetaminophen  325 MG tablet Commonly known as: TYLENOL  Take 2 tablets (650 mg total) by mouth every 4 (four) hours as needed for mild pain (or temp > 37.5 C (99.5 F)).   amiodarone 100 MG tablet Commonly known as: PACERONE Take 1 tablet (100 mg total) by mouth daily. Start taking on: December 26, 2023   apixaban  5 MG Tabs tablet Commonly known as: ELIQUIS  Take 1 tablet (5 mg total) by mouth 2 (two) times daily.   AQUAPHOR OINTMENT BODY EX Apply 1 application  topically daily. To bilateral extremities   atorvastatin  80 MG tablet Commonly known as: LIPITOR Take 1 tablet (80 mg total) by mouth daily. Start taking on: December 26, 2023 What changed:  medication strength how much to take   cefdinir 300 MG capsule Commonly known as: OMNICEF Take 1 capsule (300 mg total) by mouth 2  (two) times daily for 2 days. Start taking on: December 26, 2023   Cholecalciferol 25 MCG (1000 UT) capsule Take 1,000 Units by mouth daily.   diltiazem  120 MG 24 hr capsule Commonly known as: CARDIZEM  CD Take 1 capsule (120 mg total) by mouth daily. Start taking on: December 26, 2023   docusate sodium  100 MG capsule Commonly known as: COLACE Take 1 capsule (100 mg total) by mouth daily as needed for mild constipation.   FLUoxetine 10 MG capsule Commonly known as: PROZAC Take 10 mg by mouth daily.   folic acid  400 MCG tablet Commonly known as: FOLVITE  Take 400 mcg by mouth daily.   hydrocortisone 1 % ointment Apply 1 Application topically 3 (three) times daily as needed for itching.   hydrOXYzine  25 MG tablet Commonly known as: ATARAX  Take 25 mg by mouth every 6 (six) hours as needed for anxiety or  itching.   mirtazapine  7.5 MG tablet Commonly known as: REMERON  Take 7.5 mg by mouth at bedtime.   predniSONE 50 MG tablet Commonly known as: DELTASONE Take 1 tablet (50 mg total) by mouth daily with breakfast for 3 days. Start taking on: December 26, 2023   traZODone  50 MG tablet Commonly known as: DESYREL  Take 50 mg by mouth at bedtime.   triamcinolone  cream 0.1 % Commonly known as: KENALOG  Apply 1 Application topically 2 (two) times daily as needed (rash). Affected areas of leg        Follow-up Information     Cox, Gust, FNP Follow up.   Specialty: Family Medicine Contact information: [address] [place] [address] [phone]         Mealor, Eulas BRAVO, MD Follow up.   Specialty: Cardiology Contact information: [address] [place] [address] [phone]         [facility] Follow up in 2 month(s).   Contact information: [address]     [address] [place]  [address] [phone]               No Known Allergies  Consultations: Neurology Cardiology     Procedures/Studies: ECHOCARDIOGRAM COMPLETE Result Date: 12/23/2023    ECHOCARDIOGRAM REPORT   Patient Name:   RED MANDT Date of Exam: 12/23/2023 Medical Rec #:  [id number]     Height:       72.0 in Accession #:    [id number]    Weight:       230.2 lb Date of Birth:  Jun 15, 1933    BSA:          2.262 m Patient Age:    89 years      BP:           138/72 mmHg Patient Gender: M             HR:           82 bpm. Exam Location:  Inpatient Procedure: 2D Echo (Both Spectral and Color Flow Doppler were utilized during            procedure). Indications:    stroke  History:        Patient has prior history of Echocardiogram examinations, most                 recent 05/10/2022. Chronic kidney disease, Arrythmias:Atrial                 Fibrillation and PVC; Risk Factors:Dyslipidemia.  Sonographer:    Tinnie Barefoot RDCS Referring Phys: [id number] TIMOTHY S OPYD IMPRESSIONS  1. Left ventricular ejection fraction, by estimation, is 60 to 65%. The left ventricle has normal function. The left ventricle has no regional wall motion abnormalities. Left ventricular diastolic parameters were normal.  2. Right ventricular systolic function is normal. The right ventricular size is normal.  3. The mitral valve is normal in structure. Trivial mitral valve regurgitation. No evidence of mitral stenosis.  4. DVI 0.31. The aortic valve is normal in structure. Aortic valve regurgitation is mild. Moderate aortic valve stenosis. Aortic valve area, by VTI measures 1.08 cm. Aortic valve mean gradient measures 24.0 mmHg. Aortic valve Vmax measures 3.17 m/s.  5. The inferior vena cava not well visualized. FINDINGS  Left Ventricle: Left ventricular ejection fraction, by estimation, is 60 to 65%. The left ventricle has normal function. The left ventricle has no regional wall motion abnormalities. The left ventricular internal cavity size was  normal in size. There is  no left ventricular hypertrophy. Left ventricular diastolic parameters were  normal. Right Ventricle: The right ventricular size is normal. No increase in right ventricular wall thickness. Right ventricular systolic function is normal. Left Atrium: Left atrial size was normal in size. Right Atrium: Right atrial size was normal in size. Pericardium: There is no evidence of pericardial effusion. Mitral Valve: The mitral valve is normal in structure. Trivial mitral valve regurgitation. No evidence of mitral valve stenosis. Tricuspid Valve: The tricuspid valve is normal in structure. Tricuspid valve regurgitation is not demonstrated. No evidence of tricuspid stenosis. Aortic Valve: DVI 0.31. The aortic valve is normal in structure. Aortic valve regurgitation is mild. Aortic regurgitation PHT measures 278 msec. Moderate aortic stenosis is present. Aortic valve mean gradient measures 24.0 mmHg. Aortic valve peak gradient measures 40.2 mmHg. Aortic valve area, by VTI measures 1.08 cm. Pulmonic Valve: The pulmonic valve was not well visualized. Pulmonic valve regurgitation is not visualized. No evidence of pulmonic stenosis. Aorta: Aortic root could not be assessed. Venous: The inferior vena cava not well visualized. IAS/Shunts: No atrial level shunt detected by color flow Doppler.  LEFT VENTRICLE PLAX 2D LVIDd:         5.80 cm   Diastology LVIDs:         3.80 cm   LV e' medial:    5.55 cm/s LV PW:         1.20 cm   LV E/e' medial:  14.4 LV IVS:        1.40 cm   LV e' lateral:   11.50 cm/s LVOT diam:     2.10 cm   LV E/e' lateral: 6.9 LV SV:         70 LV SV Index:   31 LVOT Area:     3.46 cm  RIGHT VENTRICLE RV Basal diam:  3.20 cm RV S prime:     13.30 cm/s TAPSE (M-mode): 2.4 cm LEFT ATRIUM             Index        RIGHT ATRIUM           Index LA diam:        5.40 cm 2.39 cm/m   RA Area:     14.00 cm LA Vol (A2C):   65.4 ml 28.92 ml/m  RA Volume:   35.10 ml  15.52 ml/m LA Vol (A4C):   60.4 ml 26.71 ml/m LA Biplane Vol: 63.1 ml 27.90 ml/m  AORTIC VALVE AV Area (Vmax):    1.09 cm AV Area  (Vmean):   1.03 cm AV Area (VTI):     1.08 cm AV Vmax:           317.00 cm/s AV Vmean:          228.000 cm/s AV VTI:            0.654 m AV Peak Grad:      40.2 mmHg AV Mean Grad:      24.0 mmHg LVOT Vmax:         100.00 cm/s LVOT Vmean:        68.000 cm/s LVOT VTI:          0.203 m LVOT/AV VTI ratio: 0.31 AI PHT:            278 msec  AORTA Ao Root diam: 3.20 cm MR Peak grad: 144.5 mmHg MR Mean grad: 87.0 mmHg    SHUNTS MR Vmax:  601.00 cm/s  Systemic VTI:  0.20 m MR Vmean:     432.0 cm/s   Systemic Diam: 2.10 cm MV E velocity: 79.80 cm/s MV A velocity: 81.90 cm/s MV E/A ratio:  0.97 Franck Azobou Tonleu Electronically signed by Joelle Cedars Tonleu Signature Date/Time: 12/23/2023/12:59:02 PM    Final    MR BRAIN WO CONTRAST Result Date: 12/23/2023 EXAM: MRI BRAIN WITHOUT CONTRAST 12/23/2023 05:36:44 AM TECHNIQUE: Multiplanar multisequence MRI of the head/brain was performed without the administration of intravenous contrast. COMPARISON: MRI of the head dated 05/10/2022. CLINICAL HISTORY: Neuro deficit, acute, stroke suspected. FINDINGS: BRAIN AND VENTRICLES: Acute cortical infarct present within the cortex of the left precentral gyrus seen on image 86 of series 5. Acute lacunar infarct within the right cerebellar hemisphere seen on image 62. There are chronic encephalomalacia changes within the right parietal, temporal, and occipital lobes secondary to the infarct noted on the previous study. There are also chronic encephalomalacia changes within the frontal lobes bilaterally. There is moderate generalized cerebral volume loss and advanced periventricular and deep cerebral white matter disease. No intracranial hemorrhage. No mass. No midline shift. No hydrocephalus. The sella is unremarkable. Normal flow voids. ORBITS: No acute abnormality. SINUSES AND MASTOIDS: No acute abnormality. BONES AND SOFT TISSUES: Normal marrow signal. No acute soft tissue abnormality. IMPRESSION: 1. Acute cortical infarct in the  left precentral gyrus and acute lacunar infarct in the right cerebellar hemisphere. 2. Chronic encephalomalacia in the right parietal, temporal, and occipital lobes, and in the frontal lobes bilaterally. 3. Moderate generalized cerebral volume loss and advanced periventricular and deep cerebral white matter disease. Electronically signed by: Evalene Coho MD 12/23/2023 10:25 AM EDT RP Workstation: [id number]   CT CHEST W CONTRAST Result Date: 12/22/2023 CLINICAL DATA:  Provided history: Respiratory illness, nondiagnostic xray EXAM: CT CHEST WITH CONTRAST TECHNIQUE: Multidetector CT imaging of the chest was performed during intravenous contrast administration. RADIATION DOSE REDUCTION: This exam was performed according to the departmental dose-optimization program which includes automated exposure control, adjustment of the mA and/or kV according to patient size and/or use of iterative reconstruction technique. CONTRAST:  75mL OMNIPAQUE  IOHEXOL  350 MG/ML SOLN COMPARISON:  Radiograph earlier today FINDINGS: Cardiovascular: The heart is normal in size. No pericardial effusion. Coronary artery calcifications. Irregular plaque throughout the thoracic aorta. The ascending aorta is dilated at 4.5 cm. No dissection or acute aortic finding. No central pulmonary embolus on this non dedicated exam. Mediastinum/Nodes: Shotty mediastinal and hilar lymph nodes, all subcentimeter short axis. Decompressed esophagus. No thyroid  nodule. Lungs/Pleura: Elevated right hemidiaphragm. Adjacent airspace disease in the right lower and middle lobe likely compressive atelectasis. Moderate emphysema. Dependent ground-glass and consolidative opacity in the right upper lobe abutting the fissure and peripherally. Nodular areas of airspace disease in the left lower lobe, series 4, image 121. For example elongated nodular consolidation measures 4 x 1.3 cm, series 4, image 121. No pleural effusion. Upper Abdomen: Elevated right hemidiaphragm.  Bilateral renal cysts need no further imaging follow-up. Excreted IV contrast in the renal collecting systems from prior head and neck CTA. Musculoskeletal: Diffuse thoracic spondylosis. There are no acute or suspicious osseous abnormalities. IMPRESSION: 1. Dependent ground-glass and consolidative opacity in the right upper lobe abutting the fissure and peripherally, suspicious for pneumonia. 2. Nodular areas of airspace disease in the left lower lobe, largest measuring 4 x 1.3 cm also suspicious for infectio; however neoplasm is not excluded. Recommend follow-up CT in 4-6 weeks after course of treatment. 3. Elevated right hemidiaphragm with adjacent airspace disease  in the right lower and middle lobes, likely compressive atelectasis. 4. Ascending aortic dilatation at 4.5 cm. Recommend semi-annual imaging followup by CTA or MRA and referral to cardiothoracic surgery if not already obtained. This recommendation follows 2010 ACCF/AHA/AATS/ACR/ASA/SCA/SCAI/SIR/STS/SVM Guidelines for the Diagnosis and Management of Patients With Thoracic Aortic Disease. Circulation. 2010; 121: Z733-z630. Aortic aneurysm NOS (ICD10-I71.9) Aortic Atherosclerosis (ICD10-I70.0) and Emphysema (ICD10-J43.9). Electronically Signed   By: Andrea Gasman M.D.   On: 12/22/2023 22:03   CT ANGIO HEAD NECK W WO CM (CODE STROKE) Addendum Date: 12/22/2023 ADDENDUM REPORT: 12/22/2023 19:11 ADDENDUM: CTA head impression #1 called by telephone at the time of interpretation on 12/22/2023 at 7:08 pm to provider Dr. Jakie, who verbally acknowledged these results. The progressive focus of ulcerated plaque at the distal right common carotid artery was also discussed at this time. Electronically Signed   By: Rockey Childs D.O.   On: 12/22/2023 19:11   Result Date: 12/22/2023 CLINICAL DATA:  Provided history: Neuro deficit, acute, stroke suspected. EXAM: CT ANGIOGRAPHY HEAD AND NECK WITH AND WITHOUT CONTRAST TECHNIQUE: Multidetector CT imaging of the  head and neck was performed using the standard protocol during bolus administration of intravenous contrast. Multiplanar CT image reconstructions and MIPs were obtained to evaluate the vascular anatomy. Carotid stenosis measurements (when applicable) are obtained utilizing NASCET criteria, using the distal internal carotid diameter as the denominator. RADIATION DOSE REDUCTION: This exam was performed according to the departmental dose-optimization program which includes automated exposure control, adjustment of the mA and/or kV according to patient size and/or use of iterative reconstruction technique. CONTRAST:  75mL OMNIPAQUE  IOHEXOL  350 MG/ML SOLN COMPARISON:  Noncontrast head CT performed earlier today 12/22/2023. MRA head 05/10/2022. CT angiogram head/neck 05/10/2022. FINDINGS: CTA NECK FINDINGS Aortic arch: Standard aortic branching. Atherosclerotic plaque within the visualized aortic arch and proximal major branch vessels of the neck. Streak/beam hardening artifact arising from a dense contrast bolus partially obscures the left subclavian artery. Within this limitation, there is no appreciable hemodynamically significant innominate or proximal subclavian artery stenosis. Right carotid system: CCA and ICA patent within the neck. Atherosclerotic plaque. Muscle the, there is an sclerotic plaque at the carotid bifurcation and within the proximal ICA resulting in less than 50% stenosis. Progressive focus of ulcerated plaque anteriorly at the distal common carotid artery, now measuring 4 mm (series 5, image 230). Partially retropharyngeal course of the cervical ICA. Left carotid system: CCA and ICA patent within the neck. Atherosclerotic plaque. Most notably, progressive (mildly calcified) plaque within the proximal ICA results in an estimated 60-70% stenosis which has progressed (series 5, images 214 and 215). Vertebral arteries: Patent within the neck. Severe stenosis at the right vertebral artery origin,  unchanged. Atherosclerotic plaque within the right vertebral artery at the V3/V4 junction with up to moderate stenosis. The left vertebral artery is non dominant and developmentally diminutive, but patent throughout the neck. Atherosclerotic plaque at the left vertebral artery origin. Skeleton: The patient is edentulous. Spondylosis of the cervical and visible thoracic levels. No acute fracture or aggressive osseous lesion. Other neck: 12 mm ovoid nodule again demonstrated within the superficial lobe of the right parotid gland. This is suspicious for a primary parotid neoplasm. Upper chest: Incompletely imaged cystic or cavitary lesion within the right lower lobe (measuring at least 4.6 cm (series 5, image 344). Mild dependent atelectasis within the bilateral upper lobes and right lower lobe at the imaged levels. Review of the MIP images confirms the above findings CTA HEAD FINDINGS Anterior circulation: The intracranial  internal carotid arteries are patent. The right MCA proximal M2 branch which was occluded on the prior CTA of 05/10/2022 is occluded more proximally on today's study. Additionally, there is a new severe stenosis within this vessel immediately upstream. Additional atherosclerotic irregularity of more distal M2 right MCA vessels. The left middle cerebral artery M1 segment is patent. No left M2 proximal branch occlusion or high-grade proximal stenosis is identified. The anterior cerebral arteries are patent. No intracranial aneurysm is identified. Posterior circulation: The intracranial vertebral arteries are patent. Progressive atherosclerotic plaque within the right vertebral artery at the V3/V4 junction now with up to moderate stenosis. The basilar artery is patent. The posterior cerebral arteries are patent. Atherosclerotic irregularity of both vessels without high-grade proximal stenosis. Fetal origin right PCA. The left posterior communicating artery is diminutive or absent. Venous sinuses:  Limited assessment for dural venous sinus thrombosis due to contrast timing. Anatomic variants: As described. Review of the MIP images confirms the above findings Attempts are being made to reach the ordering provider at this time. IMPRESSION: CTA neck: 1. The common carotid and internal carotid arteries are patent within the neck. Atherosclerotic plaque bilaterally, as described. Progressive atherosclerotic plaque within the proximal left internal carotid artery now with an estimated 60-70% stenosis at this site. Progressive focus of ulcerated plaque anteriorly at the distal right common carotid artery. 2. The right vertebral artery is dominant and patent within the neck. Unchanged severe atherosclerotic narrowing at the right vertebral artery origin. Progressive atherosclerotic plaque within the right vertebral artery at the V3/V4 junction resulting in up to moderate stenosis. 3. The left vertebral artery is non-dominant and developmentally diminutive, but patent throughout the neck. 4. Aortic Atherosclerosis (ICD10-I70.0). 5. Partially imaged cystic or cavitary pulmonary lesion within the right lower lobe. A dedicated chest CT is recommended for further characterization. 6. Unchanged 12 mm right parotid gland nodule suspicious for a primary parotid neoplasm. ENT follow-up recommended. CTA head: 1. The right middle cerebral artery proximal M2 branch which was occluded on the prior CTA of 05/10/2022 is occluded more proximally on today's study. Additionally, there is a new severe stenosis within this vessel immediately upstream. 2. No proximal intracranial large vessel occlusion identified elsewhere. 3. Background intracranial atherosclerotic disease as described. Electronically Signed: By: Rockey Childs D.O. On: 12/22/2023 19:06   DG Chest Portable 1 View Result Date: 12/22/2023 CLINICAL DATA:  Shortness of breath EXAM: PORTABLE CHEST - 1 VIEW COMPARISON:  July 04, 2013 FINDINGS: Low lung volumes with  bronchovascular crowding. Elevation of the right hemidiaphragm with streaky right basilar atelectasis. Biapical pleural thickening. No pneumothorax or pleural effusion. Mild cardiomegaly.Cardiac loop recorder device.No acute fracture or destructive lesion. Multilevel thoracic osteophytosis. IMPRESSION: No acute cardiopulmonary abnormality. Electronically Signed   By: Rogelia Myers M.D.   On: 12/22/2023 18:49   CT HEAD CODE STROKE WO CONTRAST Result Date: 12/22/2023 CLINICAL DATA:  Code stroke. Neuro deficit, acute, stroke suspected. Dysarthria. Right facial droop. EXAM: CT HEAD WITHOUT CONTRAST TECHNIQUE: Contiguous axial images were obtained from the base of the skull through the vertex without intravenous contrast. RADIATION DOSE REDUCTION: This exam was performed according to the departmental dose-optimization program which includes automated exposure control, adjustment of the mA and/or kV according to patient size and/or use of iterative reconstruction technique. COMPARISON:  Head CT 05/16/2022. FINDINGS: Brain: Generalized cerebral atrophy. Large chronic cortical/subcortical right MCA/PCA territory infarct, increased in extent as compared to the prior head CT of 05/16/2022. Moderate-sized cortical/subcortical infarct within the mid left frontal lobe (  MCA territory), new from the prior CT but chronic in appearance. Redemonstrated chronic cortical/subcortical infarct within the left parietal lobe (MCA territory). Unchanged chronic lacunar infarct within/about the right basal ganglia. Unchanged chronic lacunar infarct within the left thalamus. An infarct within the right thalamus is new from the prior head CT, however, there is associated volume loss at this site and this appears chronic. Background patchy and ill-defined hypoattenuation within the cerebral white matter, nonspecific but compatible with chronic small vessel ischemic disease. Chronic infarcts within the bilateral cerebellar hemispheres, not  appreciably changed. There is no acute intracranial hemorrhage. No acute demarcated cortical infarct is identified. No extra-axial fluid collection. No evidence of an intracranial mass. No midline shift. Vascular: No hyperdense vessel.  Atherosclerotic calcifications. Skull: No calvarial fracture or aggressive osseous lesion. Sinuses/Orbits: No mass or acute finding within the imaged orbits. No significant paranasal sinus disease at the imaged levels. Other: Small left mastoid effusion. Associated chronic sclerotic changes within left mastoid air cells. ASPECTS [facility] Stroke Program Early CT Score) - Ganglionic level infarction (caudate, lentiform nuclei, internal capsule, insula, M1-M3 cortex): 7 - Supraganglionic infarction (M4-M6 cortex): 3 Total score (0-10 with 10 being normal): 10 No evidence of an acute intracranial abnormality. These results were communicated to Dr. Merrianne at 6:26 pmon 10/4/2025by text page via the [facility] messaging system. IMPRESSION: 1. No evidence of an acute intracranial abnormality. 2. Parenchymal atrophy, chronic small vessel ischemic disease and chronic infarcts, as described. 3. Small left mastoid effusion. Associated chronic sclerotic changes within left mastoid air cells. Electronically Signed   By: Rockey Childs D.O.   On: 12/22/2023 18:27       Discharge Exam: Vitals:   12/25/23 0747 12/25/23 1000  BP: 134/67   Pulse: 79   Resp: 19   Temp: 98 F (36.7 C)   SpO2: 97% 92%    General: Pt is alert, awake, not in acute distress Cardiovascular: RRR, S1/S2 +, no edema Respiratory: CTA bilaterally, mild expiratory wheeze right lower lung field, no respiratory distress, no conversational dyspnea  Abdominal: Soft, NT, ND, bowel sounds + Extremities: no edema, no cyanosis Neuro: Alert and oriented, mild dysarthric speech Psych: Normal mood and affect, stable judgement and insight     The results of significant diagnostics from this hospitalization (including  imaging, microbiology, ancillary and laboratory) are listed below for reference.     Microbiology: Recent Results (from the past 240 hours)  Resp panel by RT-PCR (RSV, Flu A&B, Covid) Anterior Nasal Swab     Status: None   Collection Time: 12/22/23  5:55 PM   Specimen: Anterior Nasal Swab  Result Value Ref Range Status   SARS Coronavirus 2 by RT PCR NEGATIVE NEGATIVE Final   Influenza A by PCR NEGATIVE NEGATIVE Final   Influenza B by PCR NEGATIVE NEGATIVE Final    Comment: (NOTE) The Xpert Xpress SARS-CoV-2/FLU/RSV plus assay is intended as an aid in the diagnosis of influenza from Nasopharyngeal swab specimens and should not be used as a sole basis for treatment. Nasal washings and aspirates are unacceptable for Xpert Xpress SARS-CoV-2/FLU/RSV testing.  Fact Sheet for Patients: BloggerCourse.com  Fact Sheet for Healthcare Providers: SeriousBroker.it  This test is not yet approved or cleared by the [place]  FDA and has been authorized for detection and/or diagnosis of SARS-CoV-2 by FDA under an Emergency Use Authorization (EUA). This EUA will remain in effect (meaning this test can be used) for the duration of the COVID-19 declaration under Section 564(b)(1) of the Act, 21  U.S.C. section 360bbb-3(b)(1), unless the authorization is terminated or revoked.     Resp Syncytial Virus by PCR NEGATIVE NEGATIVE Final    Comment: (NOTE) Fact Sheet for Patients: BloggerCourse.com  Fact Sheet for Healthcare Providers: SeriousBroker.it  This test is not yet approved or cleared by the [place]  FDA and has been authorized for detection and/or diagnosis of SARS-CoV-2 by FDA under an Emergency Use Authorization (EUA). This EUA will remain in effect (meaning this test can be used) for the duration of the COVID-19 declaration under Section 564(b)(1) of the Act, 21 U.S.C. section  360bbb-3(b)(1), unless the authorization is terminated or revoked.  Performed at [facility], [address], [place], [place] [address]   MRSA Next Gen by PCR, Nasal     Status: None   Collection Time: 12/24/23  1:10 PM   Specimen: Nasal Mucosa; Nasal Swab  Result Value Ref Range Status   MRSA by PCR Next Gen NOT DETECTED NOT DETECTED Final    Comment: (NOTE) The GeneXpert MRSA Assay (FDA approved for NASAL specimens only), is one component of a comprehensive MRSA colonization surveillance program. It is not intended to diagnose MRSA infection nor to guide or monitor treatment for MRSA infections. Test performance is not FDA approved in patients less than 33 years old. Performed at [facility], [address] [place], [place] [address]      Labs: BNP (last 3 results) Recent Labs    12/22/23 1752  BNP 178.5*   Basic Metabolic Panel: Recent Labs  Lab 12/22/23 1745 12/22/23 1752 12/22/23 1826 12/23/23 0413 12/24/23 0518 12/25/23 0517  NA 139 136 139 138 139 138  K 3.6 3.4* 3.4* 4.1 4.1 4.0  CL 104 102  --  104 105 105  CO2  --  23  --  20* 22 23  GLUCOSE 197* 187*  --  206* 140* 152*  BUN 21 19  --  18 27* 32*  CREATININE 1.30* 1.27*  --  1.32* 1.53* 1.34*  CALCIUM   --  8.8*  --  9.0 9.0 8.9   Liver Function Tests: Recent Labs  Lab 12/22/23 1752  AST 20  ALT 17  ALKPHOS 145*  BILITOT 0.6  PROT 7.0  ALBUMIN 3.7   No results for input(s): LIPASE, AMYLASE in the last 168 hours. No results for input(s): AMMONIA in the last 168 hours. CBC: Recent Labs  Lab 12/22/23 1745 12/22/23 1752 12/22/23 1826 12/23/23 0413  WBC  --  7.4  --  5.9  NEUTROABS  --  3.9  --   --   HGB 15.0 14.8 13.3 14.3  HCT 44.0 43.6 39.0 42.2  MCV  --  94.2  --  93.0  PLT  --  176  --  163   Cardiac Enzymes: No results for input(s): CKTOTAL, CKMB, CKMBINDEX, TROPONINI in the last 168 hours. BNP: Invalid input(s): POCBNP CBG: Recent Labs  Lab  12/22/23 1742  GLUCAP 197*   D-Dimer No results for input(s): DDIMER in the last 72 hours. Hgb A1c Recent Labs    12/23/23 0413  HGBA1C 5.6   Lipid Profile Recent Labs    12/23/23 0413  CHOL 127  HDL 43  LDLCALC 78  TRIG 29  CHOLHDL 3.0   Thyroid  function studies No results for input(s): TSH, T4TOTAL, T3FREE, THYROIDAB in the last 72 hours.  Invalid input(s): FREET3 Anemia work up No results for input(s): VITAMINB12, FOLATE, FERRITIN, TIBC, IRON, RETICCTPCT in the last 72 hours.  Urinalysis    Component Value Date/Time   COLORURINE YELLOW 05/29/2022 1926   APPEARANCEUR CLEAR 05/29/2022 1926   LABSPEC 1.017 05/29/2022 1926   PHURINE 5.0 05/29/2022 1926   GLUCOSEU NEGATIVE 05/29/2022 1926   HGBUR NEGATIVE 05/29/2022 1926   BILIRUBINUR NEGATIVE 05/29/2022 1926   KETONESUR NEGATIVE 05/29/2022 1926   PROTEINUR NEGATIVE 05/29/2022 1926   NITRITE NEGATIVE 05/29/2022 1926   LEUKOCYTESUR NEGATIVE 05/29/2022 1926   Sepsis Labs Recent Labs  Lab 12/22/23 1752 12/23/23 0413  WBC 7.4 5.9   Microbiology Recent Results (from the past 240 hours)  Resp panel by RT-PCR (RSV, Flu A&B, Covid) Anterior Nasal Swab     Status: None   Collection Time: 12/22/23  5:55 PM   Specimen: Anterior Nasal Swab  Result Value Ref Range Status   SARS Coronavirus 2 by RT PCR NEGATIVE NEGATIVE Final   Influenza A by PCR NEGATIVE NEGATIVE Final   Influenza B by PCR NEGATIVE NEGATIVE Final    Comment: (NOTE) The Xpert Xpress SARS-CoV-2/FLU/RSV plus assay is intended as an aid in the diagnosis of influenza from Nasopharyngeal swab specimens and should not be used as a sole basis for treatment. Nasal washings and aspirates are unacceptable for Xpert Xpress SARS-CoV-2/FLU/RSV testing.  Fact Sheet for Patients: BloggerCourse.com  Fact Sheet for Healthcare Providers: SeriousBroker.it  This test is not yet approved or  cleared by the [place]  FDA and has been authorized for detection and/or diagnosis of SARS-CoV-2 by FDA under an Emergency Use Authorization (EUA). This EUA will remain in effect (meaning this test can be used) for the duration of the COVID-19 declaration under Section 564(b)(1) of the Act, 21 U.S.C. section 360bbb-3(b)(1), unless the authorization is terminated or revoked.     Resp Syncytial Virus by PCR NEGATIVE NEGATIVE Final    Comment: (NOTE) Fact Sheet for Patients: BloggerCourse.com  Fact Sheet for Healthcare Providers: SeriousBroker.it  This test is not yet approved or cleared by the [place]  FDA and has been authorized for detection and/or diagnosis of SARS-CoV-2 by FDA under an Emergency Use Authorization (EUA). This EUA will remain in effect (meaning this test can be used) for the duration of the COVID-19 declaration under Section 564(b)(1) of the Act, 21 U.S.C. section 360bbb-3(b)(1), unless the authorization is terminated or revoked.  Performed at [facility], [address] [place], [place] [address]   MRSA Next Gen by PCR, Nasal     Status: None   Collection Time: 12/24/23  1:10 PM   Specimen: Nasal Mucosa; Nasal Swab  Result Value Ref Range Status   MRSA by PCR Next Gen NOT DETECTED NOT DETECTED Final    Comment: (NOTE) The GeneXpert MRSA Assay (FDA approved for NASAL specimens only), is one component of a comprehensive MRSA colonization surveillance program. It is not intended to diagnose MRSA infection nor to guide or monitor treatment for MRSA infections. Test performance is not FDA approved in patients less than 54 years old. Performed at [facility], [address], [place], [place] [address]      Patient was seen and examined on the day of discharge and was found to be in stable condition. Time coordinating discharge: 35 minutes including assessment and coordination of care, as  well as examination of the patient.   SIGNED:  Delon Hoe, DO [facility] 12/25/2023, 11:13 AM

## 2023-12-25 NOTE — Progress Notes (Signed)
 Cardiology:  Mealor  Subjective:  Denies SSCP, palpitations or Dyspnea Pharm D indicated Sayvasa and Pradaxa not affortable   Objective:  Vitals:   12/24/23 1922 12/24/23 2000 12/25/23 0200 12/25/23 0747  BP: (!) 141/70  128/69 134/67  Pulse: 80  72 79  Resp: (!) 21  16 19   Temp: 98.9 F (37.2 C)  98.4 F (36.9 C) 98 F (36.7 C)  TempSrc: Oral  Oral Oral  SpO2:  95%  97%  Weight:      Height:        Intake/Output from previous day:  Intake/Output Summary (Last 24 hours) at 12/25/2023 0759 Last data filed at 12/25/2023 0338 Gross per 24 hour  Intake --  Output 1300 ml  Net -1300 ml    Physical Exam: Elderly male Left facial droop with new dysarthria Lungs clear SEM of AS Abdomen benign LUE weak and contracted at wrist LLE weakness  Trace edema  Lab Results: Basic Metabolic Panel: Recent Labs    12/24/23 0518 12/25/23 0517  NA 139 138  K 4.1 4.0  CL 105 105  CO2 22 23  GLUCOSE 140* 152*  BUN 27* 32*  CREATININE 1.53* 1.34*  CALCIUM  9.0 8.9   Liver Function Tests: Recent Labs    12/22/23 1752  AST 20  ALT 17  ALKPHOS 145*  BILITOT 0.6  PROT 7.0  ALBUMIN 3.7    CBC: Recent Labs    12/22/23 1752 12/22/23 1826 12/23/23 0413  WBC 7.4  --  5.9  NEUTROABS 3.9  --   --   HGB 14.8 13.3 14.3  HCT 43.6 39.0 42.2  MCV 94.2  --  93.0  PLT 176  --  163    Hemoglobin A1C: Recent Labs    12/23/23 0413  HGBA1C 5.6   Fasting Lipid Panel: Recent Labs    12/23/23 0413  CHOL 127  HDL 43  LDLCALC 78  TRIG 29  CHOLHDL 3.0     Imaging: ECHOCARDIOGRAM COMPLETE Result Date: 12/23/2023    ECHOCARDIOGRAM REPORT   Patient Name:   Edward Maynard Date of Exam: 12/23/2023 Medical Rec #:  [id number]     Height:       72.0 in Accession #:    [id number]    Weight:       230.2 lb Date of Birth:  [birth year]    BSA:          2.262 m Patient Age:    89 years      BP:           138/72 mmHg Patient Gender: M             HR:           82 bpm. Exam Location:   Inpatient Procedure: 2D Echo (Both Spectral and Color Flow Doppler were utilized during            procedure). Indications:    stroke  History:        Patient has prior history of Echocardiogram examinations, most                 recent 05/10/2022. Chronic kidney disease, Arrythmias:Atrial                 Fibrillation and PVC; Risk Factors:Dyslipidemia.  Sonographer:    Tinnie Barefoot RDCS Referring Phys: [id number] TIMOTHY S OPYD IMPRESSIONS  1. Left ventricular ejection fraction, by estimation, is 60 to 65%. The left ventricle has normal function. The  left ventricle has no regional wall motion abnormalities. Left ventricular diastolic parameters were normal.  2. Right ventricular systolic function is normal. The right ventricular size is normal.  3. The mitral valve is normal in structure. Trivial mitral valve regurgitation. No evidence of mitral stenosis.  4. DVI 0.31. The aortic valve is normal in structure. Aortic valve regurgitation is mild. Moderate aortic valve stenosis. Aortic valve area, by VTI measures 1.08 cm. Aortic valve mean gradient measures 24.0 mmHg. Aortic valve Vmax measures 3.17 m/s.  5. The inferior vena cava not well visualized. FINDINGS  Left Ventricle: Left ventricular ejection fraction, by estimation, is 60 to 65%. The left ventricle has normal function. The left ventricle has no regional wall motion abnormalities. The left ventricular internal cavity size was normal in size. There is  no left ventricular hypertrophy. Left ventricular diastolic parameters were normal. Right Ventricle: The right ventricular size is normal. No increase in right ventricular wall thickness. Right ventricular systolic function is normal. Left Atrium: Left atrial size was normal in size. Right Atrium: Right atrial size was normal in size. Pericardium: There is no evidence of pericardial effusion. Mitral Valve: The mitral valve is normal in structure. Trivial mitral valve regurgitation. No evidence of mitral valve  stenosis. Tricuspid Valve: The tricuspid valve is normal in structure. Tricuspid valve regurgitation is not demonstrated. No evidence of tricuspid stenosis. Aortic Valve: DVI 0.31. The aortic valve is normal in structure. Aortic valve regurgitation is mild. Aortic regurgitation PHT measures 278 msec. Moderate aortic stenosis is present. Aortic valve mean gradient measures 24.0 mmHg. Aortic valve peak gradient measures 40.2 mmHg. Aortic valve area, by VTI measures 1.08 cm. Pulmonic Valve: The pulmonic valve was not well visualized. Pulmonic valve regurgitation is not visualized. No evidence of pulmonic stenosis. Aorta: Aortic root could not be assessed. Venous: The inferior vena cava not well visualized. IAS/Shunts: No atrial level shunt detected by color flow Doppler.  LEFT VENTRICLE PLAX 2D LVIDd:         5.80 cm   Diastology LVIDs:         3.80 cm   LV e' medial:    5.55 cm/s LV PW:         1.20 cm   LV E/e' medial:  14.4 LV IVS:        1.40 cm   LV e' lateral:   11.50 cm/s LVOT diam:     2.10 cm   LV E/e' lateral: 6.9 LV SV:         70 LV SV Index:   31 LVOT Area:     3.46 cm  RIGHT VENTRICLE RV Basal diam:  3.20 cm RV S prime:     13.30 cm/s TAPSE (M-mode): 2.4 cm LEFT ATRIUM             Index        RIGHT ATRIUM           Index LA diam:        5.40 cm 2.39 cm/m   RA Area:     14.00 cm LA Vol (A2C):   65.4 ml 28.92 ml/m  RA Volume:   35.10 ml  15.52 ml/m LA Vol (A4C):   60.4 ml 26.71 ml/m LA Biplane Vol: 63.1 ml 27.90 ml/m  AORTIC VALVE AV Area (Vmax):    1.09 cm AV Area (Vmean):   1.03 cm AV Area (VTI):     1.08 cm AV Vmax:  317.00 cm/s AV Vmean:          228.000 cm/s AV VTI:            0.654 m AV Peak Grad:      40.2 mmHg AV Mean Grad:      24.0 mmHg LVOT Vmax:         100.00 cm/s LVOT Vmean:        68.000 cm/s LVOT VTI:          0.203 m LVOT/AV VTI ratio: 0.31 AI PHT:            278 msec  AORTA Ao Root diam: 3.20 cm MR Peak grad: 144.5 mmHg MR Mean grad: 87.0 mmHg    SHUNTS MR Vmax:       601.00 cm/s  Systemic VTI:  0.20 m MR Vmean:     432.0 cm/s   Systemic Diam: 2.10 cm MV E velocity: 79.80 cm/s MV A velocity: 81.90 cm/s MV E/A ratio:  0.97 Franck Azobou Tonleu Electronically signed by Joelle Cedars Tonleu Signature Date/Time: 12/23/2023/12:59:02 PM    Final     Cardiac Studies:  ECG: afib rate 131    Telemetry:  NSR  Echo: EF 60-65% moderate AS mild AR 10/5  Medications:     stroke: early stages of recovery book   Does not apply Once   apixaban   5 mg Oral BID   atorvastatin   80 mg Oral Daily   diltiazem   120 mg Oral Daily   FLUoxetine  10 mg Oral Daily   folic acid   0.5 mg Oral Daily   mirtazapine   7.5 mg Oral QHS   predniSONE  50 mg Oral Q breakfast   traZODone   50 mg Oral QHS      cefTRIAXone (ROCEPHIN)  IV 2 g (12/24/23 1010)    Assessment/Plan:  PAF:  ? Failed anticoagulation with xarelto  and now eliquis . He lives in assisted living and his meds are brought to him  I take what they bring me   Xrays suggest some lacunar infarct that would not be embolic. He is in NSR now.Unable to afford Sayvasa or Pradaxa. Given age would not add plavix TTE with no SOE. Per Dr Rosemarie continue eliquis  Add low dose amiodarone to try and prevent PAF  AS:  moderate by echo given age and multiple strokes not likely even a TAVR candidate   Cardiology will sign off   Edward Maynard 12/25/2023, 7:59 AM

## 2023-12-27 DIAGNOSIS — I6381 Other cerebral infarction due to occlusion or stenosis of small artery: Secondary | ICD-10-CM | POA: Diagnosis not present

## 2023-12-27 DIAGNOSIS — I77810 Thoracic aortic ectasia: Secondary | ICD-10-CM | POA: Diagnosis not present

## 2023-12-27 DIAGNOSIS — R7303 Prediabetes: Secondary | ICD-10-CM | POA: Diagnosis not present

## 2023-12-27 DIAGNOSIS — I69352 Hemiplegia and hemiparesis following cerebral infarction affecting left dominant side: Secondary | ICD-10-CM | POA: Diagnosis not present

## 2023-12-27 DIAGNOSIS — Z7901 Long term (current) use of anticoagulants: Secondary | ICD-10-CM | POA: Diagnosis not present

## 2023-12-27 DIAGNOSIS — E785 Hyperlipidemia, unspecified: Secondary | ICD-10-CM | POA: Diagnosis not present

## 2023-12-27 DIAGNOSIS — N183 Chronic kidney disease, stage 3 unspecified: Secondary | ICD-10-CM | POA: Diagnosis not present

## 2023-12-27 DIAGNOSIS — I4891 Unspecified atrial fibrillation: Secondary | ICD-10-CM | POA: Diagnosis not present

## 2023-12-30 ENCOUNTER — Inpatient Hospital Stay (HOSPITAL_COMMUNITY)

## 2023-12-30 ENCOUNTER — Encounter (HOSPITAL_COMMUNITY): Payer: Self-pay | Admitting: Emergency Medicine

## 2023-12-30 ENCOUNTER — Emergency Department (HOSPITAL_COMMUNITY)

## 2023-12-30 ENCOUNTER — Observation Stay (HOSPITAL_COMMUNITY)
Admission: EM | Admit: 2023-12-30 | Discharge: 2023-12-31 | Disposition: A | Discharge status: Skilled Nursing Facility | Attending: Internal Medicine | Admitting: Internal Medicine

## 2023-12-30 ENCOUNTER — Other Ambulatory Visit: Payer: Self-pay

## 2023-12-30 DIAGNOSIS — Z8701 Personal history of pneumonia (recurrent): Secondary | ICD-10-CM | POA: Insufficient documentation

## 2023-12-30 DIAGNOSIS — E785 Hyperlipidemia, unspecified: Secondary | ICD-10-CM | POA: Diagnosis present

## 2023-12-30 DIAGNOSIS — F32A Depression, unspecified: Secondary | ICD-10-CM | POA: Diagnosis present

## 2023-12-30 DIAGNOSIS — N1831 Chronic kidney disease, stage 3a: Secondary | ICD-10-CM | POA: Diagnosis not present

## 2023-12-30 DIAGNOSIS — E782 Mixed hyperlipidemia: Secondary | ICD-10-CM

## 2023-12-30 DIAGNOSIS — Z7901 Long term (current) use of anticoagulants: Secondary | ICD-10-CM | POA: Diagnosis not present

## 2023-12-30 DIAGNOSIS — I714 Abdominal aortic aneurysm, without rupture, unspecified: Secondary | ICD-10-CM | POA: Diagnosis not present

## 2023-12-30 DIAGNOSIS — R0602 Shortness of breath: Secondary | ICD-10-CM | POA: Diagnosis not present

## 2023-12-30 DIAGNOSIS — K5790 Diverticulosis of intestine, part unspecified, without perforation or abscess without bleeding: Secondary | ICD-10-CM | POA: Diagnosis not present

## 2023-12-30 DIAGNOSIS — K119 Disease of salivary gland, unspecified: Secondary | ICD-10-CM | POA: Diagnosis not present

## 2023-12-30 DIAGNOSIS — I491 Atrial premature depolarization: Secondary | ICD-10-CM | POA: Diagnosis not present

## 2023-12-30 DIAGNOSIS — I6350 Cerebral infarction due to unspecified occlusion or stenosis of unspecified cerebral artery: Secondary | ICD-10-CM | POA: Diagnosis not present

## 2023-12-30 DIAGNOSIS — F419 Anxiety disorder, unspecified: Secondary | ICD-10-CM | POA: Insufficient documentation

## 2023-12-30 DIAGNOSIS — N189 Chronic kidney disease, unspecified: Secondary | ICD-10-CM | POA: Diagnosis present

## 2023-12-30 DIAGNOSIS — R918 Other nonspecific abnormal finding of lung field: Secondary | ICD-10-CM | POA: Diagnosis not present

## 2023-12-30 DIAGNOSIS — I69354 Hemiplegia and hemiparesis following cerebral infarction affecting left non-dominant side: Secondary | ICD-10-CM | POA: Insufficient documentation

## 2023-12-30 DIAGNOSIS — I63521 Cerebral infarction due to unspecified occlusion or stenosis of right anterior cerebral artery: Secondary | ICD-10-CM | POA: Diagnosis not present

## 2023-12-30 DIAGNOSIS — Z79899 Other long term (current) drug therapy: Secondary | ICD-10-CM | POA: Insufficient documentation

## 2023-12-30 DIAGNOSIS — G9340 Encephalopathy, unspecified: Secondary | ICD-10-CM | POA: Diagnosis not present

## 2023-12-30 DIAGNOSIS — I6389 Other cerebral infarction: Principal | ICD-10-CM | POA: Insufficient documentation

## 2023-12-30 DIAGNOSIS — E876 Hypokalemia: Secondary | ICD-10-CM | POA: Diagnosis not present

## 2023-12-30 DIAGNOSIS — K118 Other diseases of salivary glands: Secondary | ICD-10-CM | POA: Diagnosis not present

## 2023-12-30 DIAGNOSIS — I6930 Unspecified sequelae of cerebral infarction: Secondary | ICD-10-CM

## 2023-12-30 DIAGNOSIS — R531 Weakness: Secondary | ICD-10-CM | POA: Diagnosis not present

## 2023-12-30 DIAGNOSIS — I480 Paroxysmal atrial fibrillation: Secondary | ICD-10-CM | POA: Diagnosis not present

## 2023-12-30 DIAGNOSIS — I63511 Cerebral infarction due to unspecified occlusion or stenosis of right middle cerebral artery: Secondary | ICD-10-CM | POA: Diagnosis not present

## 2023-12-30 DIAGNOSIS — R29706 NIHSS score 6: Secondary | ICD-10-CM

## 2023-12-30 DIAGNOSIS — R4182 Altered mental status, unspecified: Principal | ICD-10-CM

## 2023-12-30 DIAGNOSIS — I63512 Cerebral infarction due to unspecified occlusion or stenosis of left middle cerebral artery: Secondary | ICD-10-CM | POA: Diagnosis not present

## 2023-12-30 DIAGNOSIS — R7303 Prediabetes: Secondary | ICD-10-CM | POA: Diagnosis not present

## 2023-12-30 DIAGNOSIS — I639 Cerebral infarction, unspecified: Secondary | ICD-10-CM

## 2023-12-30 DIAGNOSIS — I6601 Occlusion and stenosis of right middle cerebral artery: Secondary | ICD-10-CM | POA: Diagnosis not present

## 2023-12-30 DIAGNOSIS — I129 Hypertensive chronic kidney disease with stage 1 through stage 4 chronic kidney disease, or unspecified chronic kidney disease: Secondary | ICD-10-CM | POA: Diagnosis not present

## 2023-12-30 DIAGNOSIS — R9389 Abnormal findings on diagnostic imaging of other specified body structures: Secondary | ICD-10-CM | POA: Diagnosis not present

## 2023-12-30 DIAGNOSIS — I499 Cardiac arrhythmia, unspecified: Secondary | ICD-10-CM | POA: Diagnosis not present

## 2023-12-30 DIAGNOSIS — Z7982 Long term (current) use of aspirin: Secondary | ICD-10-CM | POA: Insufficient documentation

## 2023-12-30 DIAGNOSIS — I77810 Thoracic aortic ectasia: Secondary | ICD-10-CM | POA: Diagnosis not present

## 2023-12-30 DIAGNOSIS — I10 Essential (primary) hypertension: Secondary | ICD-10-CM | POA: Diagnosis not present

## 2023-12-30 DIAGNOSIS — G9389 Other specified disorders of brain: Secondary | ICD-10-CM | POA: Diagnosis not present

## 2023-12-30 DIAGNOSIS — I6523 Occlusion and stenosis of bilateral carotid arteries: Secondary | ICD-10-CM | POA: Diagnosis not present

## 2023-12-30 DIAGNOSIS — J189 Pneumonia, unspecified organism: Secondary | ICD-10-CM | POA: Insufficient documentation

## 2023-12-30 DIAGNOSIS — K760 Fatty (change of) liver, not elsewhere classified: Secondary | ICD-10-CM | POA: Diagnosis not present

## 2023-12-30 DIAGNOSIS — G459 Transient cerebral ischemic attack, unspecified: Secondary | ICD-10-CM | POA: Diagnosis not present

## 2023-12-30 DIAGNOSIS — I6501 Occlusion and stenosis of right vertebral artery: Secondary | ICD-10-CM | POA: Diagnosis not present

## 2023-12-30 LAB — CBC WITH DIFFERENTIAL/PLATELET
Abs Immature Granulocytes: 0.37 K/uL — ABNORMAL HIGH (ref 0.00–0.07)
Basophils Absolute: 0.1 K/uL (ref 0.0–0.1)
Basophils Relative: 0 %
Eosinophils Absolute: 0.3 K/uL (ref 0.0–0.5)
Eosinophils Relative: 2 %
HCT: 45.9 % (ref 39.0–52.0)
Hemoglobin: 15.6 g/dL (ref 13.0–17.0)
Immature Granulocytes: 3 %
Lymphocytes Relative: 13 %
Lymphs Abs: 1.9 K/uL (ref 0.7–4.0)
MCH: 31.5 pg (ref 26.0–34.0)
MCHC: 34 g/dL (ref 30.0–36.0)
MCV: 92.7 fL (ref 80.0–100.0)
Monocytes Absolute: 1.5 K/uL — ABNORMAL HIGH (ref 0.1–1.0)
Monocytes Relative: 10 %
Neutro Abs: 11 K/uL — ABNORMAL HIGH (ref 1.7–7.7)
Neutrophils Relative %: 72 %
Platelets: 200 K/uL (ref 150–400)
RBC: 4.95 MIL/uL (ref 4.22–5.81)
RDW: 13.8 % (ref 11.5–15.5)
WBC: 15 K/uL — ABNORMAL HIGH (ref 4.0–10.5)
nRBC: 0 % (ref 0.0–0.2)

## 2023-12-30 LAB — I-STAT VENOUS BLOOD GAS, ED
Acid-Base Excess: 5 mmol/L — ABNORMAL HIGH (ref 0.0–2.0)
Bicarbonate: 29.6 mmol/L — ABNORMAL HIGH (ref 20.0–28.0)
Calcium, Ion: 1.1 mmol/L — ABNORMAL LOW (ref 1.15–1.40)
HCT: 43 % (ref 39.0–52.0)
Hemoglobin: 14.6 g/dL (ref 13.0–17.0)
O2 Saturation: 88 %
Potassium: 3.5 mmol/L (ref 3.5–5.1)
Sodium: 140 mmol/L (ref 135–145)
TCO2: 31 mmol/L (ref 22–32)
pCO2, Ven: 43.2 mmHg — ABNORMAL LOW (ref 44–60)
pH, Ven: 7.444 — ABNORMAL HIGH (ref 7.25–7.43)
pO2, Ven: 53 mmHg — ABNORMAL HIGH (ref 32–45)

## 2023-12-30 LAB — COMPREHENSIVE METABOLIC PANEL WITH GFR
ALT: 67 U/L — ABNORMAL HIGH (ref 0–44)
AST: 30 U/L (ref 15–41)
Albumin: 3.3 g/dL — ABNORMAL LOW (ref 3.5–5.0)
Alkaline Phosphatase: 136 U/L — ABNORMAL HIGH (ref 38–126)
Anion gap: 14 (ref 5–15)
BUN: 19 mg/dL (ref 8–23)
CO2: 27 mmol/L (ref 22–32)
Calcium: 8.5 mg/dL — ABNORMAL LOW (ref 8.9–10.3)
Chloride: 101 mmol/L (ref 98–111)
Creatinine, Ser: 1.29 mg/dL — ABNORMAL HIGH (ref 0.61–1.24)
GFR, Estimated: 54 mL/min — ABNORMAL LOW (ref >60)
Glucose, Bld: 105 mg/dL — ABNORMAL HIGH (ref 70–99)
Potassium: 3.4 mmol/L — ABNORMAL LOW (ref 3.5–5.1)
Sodium: 142 mmol/L (ref 135–145)
Total Bilirubin: 0.9 mg/dL (ref 0.0–1.2)
Total Protein: 6.7 g/dL (ref 6.5–8.1)

## 2023-12-30 LAB — URINALYSIS, ROUTINE W REFLEX MICROSCOPIC
Bilirubin Urine: NEGATIVE
Glucose, UA: NEGATIVE mg/dL
Hgb urine dipstick: NEGATIVE
Ketones, ur: NEGATIVE mg/dL
Leukocytes,Ua: NEGATIVE
Nitrite: NEGATIVE
Protein, ur: NEGATIVE mg/dL
Specific Gravity, Urine: 1.009 (ref 1.005–1.030)
pH: 7 (ref 5.0–8.0)

## 2023-12-30 LAB — I-STAT CG4 LACTIC ACID, ED: Lactic Acid, Venous: 1.2 mmol/L (ref 0.5–1.9)

## 2023-12-30 LAB — RESP PANEL BY RT-PCR (RSV, FLU A&B, COVID)  RVPGX2
Influenza A by PCR: NEGATIVE
Influenza B by PCR: NEGATIVE
Resp Syncytial Virus by PCR: NEGATIVE
SARS Coronavirus 2 by RT PCR: NEGATIVE

## 2023-12-30 LAB — PROCALCITONIN: Procalcitonin: <0.1 ng/mL

## 2023-12-30 LAB — STREP PNEUMONIAE URINARY ANTIGEN: Strep Pneumo Urinary Antigen: NEGATIVE

## 2023-12-30 MED ORDER — AQUAPHOR OINTMENT BODY EX AERO: INHALATION_SPRAY | Freq: Every day | CUTANEOUS | Status: DC

## 2023-12-30 MED ORDER — ONDANSETRON HCL 4 MG PO TABS: 4.0000 mg | ORAL_TABLET | Freq: Four times a day (QID) | ORAL | Status: DC | PRN

## 2023-12-30 MED ORDER — GUAIFENESIN ER 600 MG PO TB12
600.0000 mg | ORAL_TABLET | Freq: Two times a day (BID) | ORAL | Status: DC
  Administered 2023-12-30 – 2023-12-31 (×3): 600 mg via ORAL
  Filled 2023-12-30 (×3): qty 1

## 2023-12-30 MED ORDER — AZITHROMYCIN 250 MG PO TABS: 500.0000 mg | ORAL_TABLET | Freq: Every day | ORAL | Status: DC

## 2023-12-30 MED ORDER — APIXABAN 5 MG PO TABS
5.0000 mg | ORAL_TABLET | Freq: Two times a day (BID) | ORAL | Status: DC
  Administered 2023-12-30 – 2023-12-31 (×3): 5 mg via ORAL
  Filled 2023-12-30 (×3): qty 1

## 2023-12-30 MED ORDER — HYDROCORTISONE 1 % EX OINT
1.0000 | TOPICAL_OINTMENT | Freq: Three times a day (TID) | CUTANEOUS | Status: DC | PRN
  Filled 2023-12-30: qty 28

## 2023-12-30 MED ORDER — SODIUM CHLORIDE 0.9 % IV SOLN
500.0000 mg | Freq: Once | INTRAVENOUS | Status: AC
  Administered 2023-12-30: 500 mg via INTRAVENOUS
  Filled 2023-12-30: qty 5

## 2023-12-30 MED ORDER — ALBUTEROL SULFATE (2.5 MG/3ML) 0.083% IN NEBU: 2.5000 mg | INHALATION_SOLUTION | RESPIRATORY_TRACT | Status: DC | PRN

## 2023-12-30 MED ORDER — POTASSIUM CHLORIDE 10 MEQ/100ML IV SOLN
10.0000 meq | Freq: Once | INTRAVENOUS | Status: AC
  Administered 2023-12-30: 10 meq via INTRAVENOUS
  Filled 2023-12-30: qty 100

## 2023-12-30 MED ORDER — FLUOXETINE HCL 10 MG PO CAPS
10.0000 mg | ORAL_CAPSULE | Freq: Every day | ORAL | Status: DC
  Administered 2023-12-30 – 2023-12-31 (×2): 10 mg via ORAL
  Filled 2023-12-30 (×3): qty 1

## 2023-12-30 MED ORDER — POTASSIUM CHLORIDE 10 MEQ/100ML IV SOLN
10.0000 meq | INTRAVENOUS | Status: AC
  Administered 2023-12-30: 10 meq via INTRAVENOUS
  Filled 2023-12-30: qty 100

## 2023-12-30 MED ORDER — ACETAMINOPHEN 650 MG RE SUPP: 650.0000 mg | Freq: Four times a day (QID) | RECTAL | Status: DC | PRN

## 2023-12-30 MED ORDER — IOHEXOL 350 MG/ML SOLN
75.0000 mL | Freq: Once | INTRAVENOUS | Status: AC | PRN
  Administered 2023-12-30: 75 mL via INTRAVENOUS

## 2023-12-30 MED ORDER — ATORVASTATIN CALCIUM 80 MG PO TABS
80.0000 mg | ORAL_TABLET | Freq: Every day | ORAL | Status: DC
  Administered 2023-12-30 – 2023-12-31 (×2): 80 mg via ORAL
  Filled 2023-12-30: qty 2
  Filled 2023-12-30: qty 1

## 2023-12-30 MED ORDER — SODIUM CHLORIDE 0.9 % IV SOLN: 2.0000 g | INTRAVENOUS | Status: DC

## 2023-12-30 MED ORDER — AMIODARONE HCL 200 MG PO TABS
100.0000 mg | ORAL_TABLET | Freq: Every day | ORAL | Status: DC
  Administered 2023-12-30 – 2023-12-31 (×2): 100 mg via ORAL
  Filled 2023-12-30 (×2): qty 1

## 2023-12-30 MED ORDER — FOLIC ACID 1 MG PO TABS
500.0000 ug | ORAL_TABLET | Freq: Every day | ORAL | Status: DC
  Administered 2023-12-30 – 2023-12-31 (×2): 0.5 mg via ORAL
  Filled 2023-12-30 (×2): qty 1

## 2023-12-30 MED ORDER — DILTIAZEM HCL ER COATED BEADS 120 MG PO CP24
120.0000 mg | ORAL_CAPSULE | Freq: Every day | ORAL | Status: DC
  Administered 2023-12-30 – 2023-12-31 (×2): 120 mg via ORAL
  Filled 2023-12-30 (×2): qty 1

## 2023-12-30 MED ORDER — ACETAMINOPHEN 325 MG PO TABS: 650.0000 mg | ORAL_TABLET | Freq: Four times a day (QID) | ORAL | Status: DC | PRN

## 2023-12-30 MED ORDER — SODIUM CHLORIDE 0.9 % IV SOLN
1.0000 g | Freq: Once | INTRAVENOUS | Status: AC
  Administered 2023-12-30: 1 g via INTRAVENOUS
  Filled 2023-12-30: qty 10

## 2023-12-30 MED ORDER — SODIUM CHLORIDE 0.9% FLUSH
3.0000 mL | Freq: Two times a day (BID) | INTRAVENOUS | Status: DC
  Administered 2023-12-30 – 2023-12-31 (×2): 3 mL via INTRAVENOUS

## 2023-12-30 MED ORDER — DOCUSATE SODIUM 100 MG PO CAPS: 100.0000 mg | ORAL_CAPSULE | Freq: Every day | ORAL | Status: DC | PRN

## 2023-12-30 MED ORDER — AQUAPHOR EX OINT
TOPICAL_OINTMENT | Freq: Every day | CUTANEOUS | Status: DC
  Filled 2023-12-30 (×2): qty 50

## 2023-12-30 MED ORDER — TRAZODONE HCL 50 MG PO TABS
50.0000 mg | ORAL_TABLET | Freq: Every day | ORAL | Status: DC
  Administered 2023-12-30 – 2023-12-31 (×2): 50 mg via ORAL
  Filled 2023-12-30 (×2): qty 1

## 2023-12-30 MED ORDER — MIRTAZAPINE 15 MG PO TABS
7.5000 mg | ORAL_TABLET | Freq: Every day | ORAL | Status: DC
  Administered 2023-12-30 – 2023-12-31 (×2): 7.5 mg via ORAL
  Filled 2023-12-30 (×2): qty 1

## 2023-12-30 MED ORDER — LACTATED RINGERS IV BOLUS
1000.0000 mL | Freq: Once | INTRAVENOUS | Status: AC
  Administered 2023-12-30: 1000 mL via INTRAVENOUS

## 2023-12-30 MED ORDER — IOHEXOL 350 MG/ML SOLN
75.0000 mL | Freq: Once | INTRAVENOUS | Status: AC | PRN
Start: 2023-12-30 — End: 2023-12-30
  Administered 2023-12-30: 75 mL via INTRAVENOUS

## 2023-12-30 MED ORDER — ONDANSETRON HCL 4 MG/2ML IJ SOLN: 4.0000 mg | Freq: Four times a day (QID) | INTRAMUSCULAR | Status: DC | PRN

## 2023-12-30 NOTE — Progress Notes (Signed)
 MRI revealed concerns for a new stroke stroke.  Family updated and neurology was formally consulted.

## 2023-12-30 NOTE — Consult Note (Signed)
 NEUROLOGY CONSULT NOTE   Date of service: December 30, 2023 Patient Name: Edward Maynard MRN:  [id number] DOB:  Apr 07, 1933 Chief Complaint: Slurred speech, new strokes Requesting Provider: Claudene Maximino LABOR, MD  History of Present Illness  Edward Maynard is a 89 y.o. male with hx of hyperlipidemia, chronic left-sided weakness in setting of prior stroke, atrial fibrillation on Eliquis , who presents today with worsening slurring speech along with increased confusion from his baseline.  He was just hospitalized between 10/4 and 10/7 after dysarthric speech, right-sided facial droop.  He was evaluated by neurology and was attempted to switch over to Pradaxa from Eliquis  but was unaffordable with his insurance.  This a.m., was found to be more confused at his facility and his chest x-ray in the ED is concerning for increased right basilar opacity concerning for atelectasis, aspiration, infection.  He had an MRI of the brain as part of his noted worsening slurring speech today and was notable for new right frontal lobe infarct along with evolving left posterior frontal lobe infarct.  Neurology consulted for noted new acute strokes.  Patient reports that his speech may be slightly slurred compared to his baseline and he noticed this around 5:30 PM on Saturday, 12/29/2023.  He reports compliance with Eliquis  and has not missed any doses at all.  LKW:   Modified rankin score: 3-Moderate disability-requires help but walks WITHOUT assistance IV Thrombolysis: Not offered, outside of a known patient on Eliquis .   EVT: Not offered, low suspicion for an LVO.  And is also too mild to treat.    NIHSS components Score: Comment  1a Level of Conscious 0[x]  1[]  2[]  3[]      1b LOC Questions 0[x]  1[]  2[]       1c LOC Commands 0[x]  1[]  2[]       2 Best Gaze 0[x]  1[]  2[]       3 Visual 0[]  1[x]  2[]  3[]      4 Facial Palsy 0[x]  1[]  2[]  3[]      5a Motor Arm - left 0[]  1[x]  2[]  3[]  4[]  UN[]    5b Motor Arm - Right  0[x]  1[]  2[]  3[]  4[]  UN[]    6a Motor Leg - Left 0[]  1[x]  2[]  3[]  4[]  UN[]    6b Motor Leg - Right 0[x]  1[]  2[]  3[]  4[]  UN[]    7 Limb Ataxia 0[]  1[x]  2[]  UN[]      8 Sensory 0[]  1[x]  2[]  UN[]      9 Best Language 0[x]  1[]  2[]  3[]      10 Dysarthria 0[]  1[x]  2[]  UN[]      11 Extinct. and Inattention 0[x]  1[]  2[]       TOTAL:       ROS  Comprehensive ROS performed and pertinent positives documented in HPI   Past History   Past Medical History:  Diagnosis Date   High cholesterol    05/2013; no defecits   Hyperlipemia    Left arm weakness    Left hand weakness    Personal history of colonic polyp - adenoma  10/23/2013   Stroke (HCC) 05/2013   MRI on 07/02/13 = Multifocal acute & subacute infarction involving right frontal MCA/ACA & left parietal MCA/PCA watershed areas    Past Surgical History:  Procedure Laterality Date   IR CT HEAD LTD  05/10/2022   IR PERCUTANEOUS ART THROMBECTOMY/INFUSION INTRACRANIAL INC DIAG ANGIO  05/10/2022   LOOP RECORDER IMPLANT N/A 10/31/2013   Procedure: LOOP RECORDER IMPLANT;  Surgeon: Lynwood JONETTA Rakers, MD;  Location: [facility] CATH [facility];  Service: Cardiovascular;  Laterality: N/A;   RADIOLOGY WITH ANESTHESIA N/A 05/10/2022   Procedure: IR WITH ANESTHESIA;  Surgeon: Dolphus Carrion, MD;  Location: [facility] OR;  Service: Radiology;  Laterality: N/A;   SHOULDER SURGERY Bilateral 2003, 1995    Family History: Family History  Problem Relation Age of Onset   Hypertension Mother    Hypertension Father    Stroke Father    COPD Brother    Stroke Brother    Colon cancer Neg Hx    Heart attack Neg Hx     Social History  reports that he quit smoking about 53 years ago. His smoking use included cigarettes. He has never used smokeless tobacco. He reports that he does not drink alcohol and does not use drugs.  No Known Allergies  Medications   Current Facility-Administered Medications:    acetaminophen  (TYLENOL ) tablet 650 mg, 650 mg, Oral, Q6H PRN **OR** acetaminophen   (TYLENOL ) suppository 650 mg, 650 mg, Rectal, Q6H PRN, Claudene, Rondell A, MD   albuterol (PROVENTIL) (2.5 MG/3ML) 0.083% nebulizer solution 2.5 mg, 2.5 mg, Nebulization, Q4H PRN, Claudene, Rondell A, MD   amiodarone (PACERONE) tablet 100 mg, 100 mg, Oral, Daily, Claudene, Rondell A, MD, 100 mg at 12/30/23 1642   apixaban  (ELIQUIS ) tablet 5 mg, 5 mg, Oral, BID, Smith, Rondell A, MD, 5 mg at 12/30/23 1642   atorvastatin  (LIPITOR) tablet 80 mg, 80 mg, Oral, Daily, Claudene, Rondell A, MD, 80 mg at 12/30/23 1644   [START ON 12/31/2023] azithromycin (ZITHROMAX) tablet 500 mg, 500 mg, Oral, Daily, Claudene, Rondell A, MD   [START ON 12/31/2023] cefTRIAXone (ROCEPHIN) 2 g in sodium chloride  0.9 % 100 mL IVPB, 2 g, Intravenous, Q24H, Smith, Rondell A, MD   diltiazem  (CARDIZEM  CD) 24 hr capsule 120 mg, 120 mg, Oral, Daily, Smith, Rondell A, MD, 120 mg at 12/30/23 1641   docusate sodium  (COLACE) capsule 100 mg, 100 mg, Oral, Daily PRN, Claudene, Rondell A, MD   FLUoxetine (PROZAC) capsule 10 mg, 10 mg, Oral, Daily, Claudene, Rondell A, MD, 10 mg at 12/30/23 1642   folic acid  (FOLVITE ) tablet 0.5 mg, 500 mcg, Oral, Daily, Claudene, Rondell A, MD, 0.5 mg at 12/30/23 1644   guaiFENesin (MUCINEX) 12 hr tablet 600 mg, 600 mg, Oral, BID, Smith, Rondell A, MD, 600 mg at 12/30/23 2126   hydrocortisone 1 % ointment 1 Application, 1 Application, Topical, TID PRN, Smith, Rondell A, MD   mineral oil-hydrophilic petrolatum (AQUAPHOR) ointment, , Topical, Daily, Smith, Rondell A, MD   mirtazapine  (REMERON ) tablet 7.5 mg, 7.5 mg, Oral, QHS, Smith, Rondell A, MD, 7.5 mg at 12/30/23 2127   ondansetron  (ZOFRAN ) tablet 4 mg, 4 mg, Oral, Q6H PRN **OR** ondansetron  (ZOFRAN ) injection 4 mg, 4 mg, Intravenous, Q6H PRN, Smith, Rondell A, MD   sodium chloride  flush (NS) 0.9 % injection 3 mL, 3 mL, Intravenous, Q12H, Smith, Rondell A, MD, 3 mL at 12/30/23 2127   traZODone  (DESYREL ) tablet 50 mg, 50 mg, Oral, QHS, Smith, Rondell A, MD, 50 mg at 12/30/23  2126  Current Outpatient Medications:    acetaminophen  (TYLENOL ) 325 MG tablet, Take 2 tablets (650 mg total) by mouth every 4 (four) hours as needed for mild pain (or temp > 37.5 C (99.5 F))., Disp: , Rfl:    amiodarone (PACERONE) 200 MG tablet, Take 100 mg by mouth daily., Disp: , Rfl:    apixaban  (ELIQUIS ) 5 MG TABS tablet, Take 1 tablet (5 mg total) by mouth 2 (two) times daily., Disp: 60 tablet, Rfl: 0  atorvastatin  (LIPITOR) 80 MG tablet, Take 1 tablet (80 mg total) by mouth daily., Disp: 30 tablet, Rfl: 0   Cholecalciferol 25 MCG (1000 UT) capsule, Take 1,000 Units by mouth daily., Disp: , Rfl:    diltiazem  (CARDIZEM  CD) 120 MG 24 hr capsule, Take 1 capsule (120 mg total) by mouth daily., Disp: 30 capsule, Rfl: 0   docusate sodium  (COLACE) 100 MG capsule, Take 1 capsule (100 mg total) by mouth daily as needed for mild constipation., Disp: , Rfl:    Emollient (AQUAPHOR OINTMENT BODY EX), Apply 1 application  topically daily. To bilateral extremities, Disp: , Rfl:    FLUoxetine (PROZAC) 10 MG capsule, Take 10 mg by mouth daily., Disp: , Rfl:    folic acid  (FOLVITE ) 400 MCG tablet, Take 400 mcg by mouth daily., Disp: , Rfl:    hydrocortisone 1 % ointment, Apply 1 Application topically 3 (three) times daily as needed for itching., Disp: , Rfl:    hydrOXYzine  (ATARAX ) 25 MG tablet, Take 25 mg by mouth every 6 (six) hours as needed for anxiety or itching., Disp: , Rfl:    mirtazapine  (REMERON ) 7.5 MG tablet, Take 7.5 mg by mouth at bedtime., Disp: , Rfl:    traZODone  (DESYREL ) 50 MG tablet, Take 50 mg by mouth at bedtime., Disp: , Rfl:    triamcinolone  cream (KENALOG ) 0.1 %, Apply 1 Application topically 2 (two) times daily as needed (rash). Affected areas of leg, Disp: , Rfl:   Vitals   Vitals:   12/30/23 1700 12/30/23 1732 12/30/23 2032 12/30/23 2125  BP: 122/69 136/71 120/79   Pulse: 78 78 66   Resp: 17 15 19    Temp:  98 F (36.7 C)  98.2 F (36.8 C)  TempSrc:  Oral  Oral  SpO2:  94% 96% 95%   Weight:      Height:        Body mass index is 31.22 kg/m.   Physical Exam   General: Laying comfortably in bed; in no acute distress.  HENT: Normal oropharynx and mucosa. Normal external appearance of ears and nose.  Neck: Supple, no pain or tenderness  CV: No JVD. No peripheral edema.  Pulmonary: Symmetric Chest rise. Normal respiratory effort.  Abdomen: Soft to touch, non-tender.  Ext: No cyanosis, edema, or deformity  Skin: No rash. Normal palpation of skin.   Musculoskeletal: Normal digits and nails by inspection. No clubbing.   Neurologic Examination  Mental status/Cognition: Alert, oriented to self, place, month and year, good attention.  Speech/language: Dysarthric speech, fluent, comprehension intact, object naming intact, repetition intact.  Cranial nerves:   CN II Pupils equal and reactive to light, inconsistent responses to visual field testing in left Hemi visual field.  Specifically worse in inferior quadrant.   CN III,IV,VI EOM intact, no gaze preference or deviation, no nystagmus    CN V normal sensation in V1, V2, and V3 segments bilaterally    CN VII no asymmetry, no nasolabial fold flattening    CN VIII normal hearing to speech    CN IX & X normal palatal elevation, no uvular deviation    CN XI 5/5 head turn and 5/5 shoulder shrug bilaterally    CN XII midline tongue protrusion    Motor:  Muscle bulk: Normal, tone increase in left upper extremity. Mvmt Root Nerve  Muscle Right Left Comments  SA C5/6 Ax Deltoid 5 3   EF C5/6 Mc Biceps 5 4   EE C6/7/8 Rad Triceps 5 4  WF C6/7 Med FCR     WE C7/8 PIN ECU     F Ab C8/T1 U ADM/FDI 5 4+   HF L1/2/3 Fem Illopsoas 5 4   KE L2/3/4 Fem Quad     DF L4/5 D Peron Tib Ant 5 5   PF S1/2 Tibial Grc/Sol 5 5    Sensation:  Light touch Slightly decreased in left upper extremity.   Pin prick    Temperature    Vibration   Proprioception    Coordination/Complex Motor:  - Finger to Nose with ataxia  left upper extremity. - Heel to shin intact bilaterally - Rapid alternating movement are slowed on the left. - Gait: Deferred for patient safety. Labs/Imaging/Neurodiagnostic studies   CBC:  Recent Labs  Lab Jan 03, 2024 0748 2024-01-03 1228  WBC 15.0*  --   NEUTROABS 11.0*  --   HGB 15.6 14.6  HCT 45.9 43.0  MCV 92.7  --   PLT 200  --    Basic Metabolic Panel:  Lab Results  Component Value Date   NA 140 01-03-24   K 3.5 Jan 03, 2024   CO2 27 01-03-2024   GLUCOSE 105 (H) 01-03-24   BUN 19 2024/01/03   CREATININE 1.29 (H) Jan 03, 2024   CALCIUM  8.5 (L) 01-03-2024   GFRNONAA 54 (L) 01/03/24   GFRAA 65 04/04/2018   Lipid Panel:  Lab Results  Component Value Date   LDLCALC 78 12/23/2023   HgbA1c:  Lab Results  Component Value Date   HGBA1C 5.6 12/23/2023   Urine Drug Screen:     Component Value Date/Time   LABOPIA NONE DETECTED 12/22/2023 2040   COCAINSCRNUR NONE DETECTED 12/22/2023 2040   LABBENZ NONE DETECTED 12/22/2023 2040   AMPHETMU NONE DETECTED 12/22/2023 2040   THCU NONE DETECTED 12/22/2023 2040   LABBARB NONE DETECTED 12/22/2023 2040    Alcohol Level     Component Value Date/Time   ETH <15 12/22/2023 1752   INR  Lab Results  Component Value Date   INR 1.2 12/22/2023   APTT  Lab Results  Component Value Date   APTT 36 12/22/2023   AED levels: No results found for: PHENYTOIN, ZONISAMIDE, LAMOTRIGINE, LEVETIRACETA  CT Head without contrast(Personally reviewed): CTH was negative for a large hypodensity concerning for a large territory infarct or hyperdensity concerning for an ICH  CT angio Head and Neck with contrast(Personally reviewed): Pending  MRI Brain(Personally reviewed): 1. New acute infarcts in the right frontal lobe. 2. Evolving subacute infarct in the left posterior frontal lobe. 3. Remote right MCA territory and cerebellar infarcts.  ASSESSMENT   Edward Maynard is a 89 y.o. male ith hx of hyperlipidemia, chronic  left-sided weakness in setting of prior stroke, atrial fibrillation on Eliquis , who presents today with worsening slurring speech along with increased confusion from his baseline.  He is admitted for pneumonia.  MRI of the brain obtained due to worsening slurred speech and demonstrates new acute infarcts in the right frontal lobe in addition to the noted subacute infarct in the left posterior frontal lobe.  Etiology of the infarcts remain unclear, he is on Eliquis  and is compliant and has not missed any doses.  In addition to the standard stroke workup, I will also get a CT of the chest abdomen pelvis to rule out a malignancy.  RECOMMENDATIONS  - Frequent Neuro checks per stroke unit protocol - Recommend Vascular imaging with CT angio of the head and neck -No need to obtain TTE as he already has known  history of atrial fibrillation and is already on Eliquis . - No need for repeat lipid panel as it was just done 7 days ago with LDL of 78. - No need for HbA1c as this was already done 7 days ago with his HbA1c of 5.6. - Continue anticoagulation with Eliquis  5 mg twice daily. - SBP goal -aim for gradual normotension. - Recommend Telemetry monitoring for arrythmia - Recommend bedside swallow screen prior to PO intake. - Stroke education booklet - Recommend PT/OT/SLP consult - CT chest abdomen pelvis with contrast to rule out malignancy.  ______________________________________________________________________  Plan discussed with patient at the bedside and with Dr. Segars with the overnight hospitalist team.  Signed, Daksh Coates, MD [facility]

## 2023-12-30 NOTE — ED Provider Notes (Signed)
 [place] Provider Note   CSN: [id number] Arrival date & time: 12/30/23  9297     Patient presents with: Altered Mental Status (Per facility pt was acting differently, no Know Last Well per facility, pt c/o weakness pt has previous TIA from 8 days ago )   Edward Maynard is a 89 y.o. male.   This is an 89 year old male in emergency department for reportedly altered mental status per EMS report.  Found by staff this morning being more confused.  On my examination patient is alert and oriented x 3.  He is seemingly slow to answer questions however. Denies headache, vision loss, chest pain, notes mild shortness of breath, no abdominal pain or pain anywhere for that matter.  He has no specific complaints.   Altered Mental Status      Prior to Admission medications   Medication Sig Start Date End Date Taking? Authorizing Provider  acetaminophen  (TYLENOL ) 325 MG tablet Take 2 tablets (650 mg total) by mouth every 4 (four) hours as needed for mild pain (or temp > 37.5 C (99.5 F)). 06/07/22  Yes Angiulli, Toribio PARAS, PA-C  amiodarone (PACERONE) 200 MG tablet Take 100 mg by mouth daily. 12/25/23  Yes [provider]  apixaban  (ELIQUIS ) 5 MG TABS tablet Take 1 tablet (5 mg total) by mouth 2 (two) times daily. 05/18/22  Yes Waddell Karna LABOR, NP  atorvastatin  (LIPITOR) 80 MG tablet Take 1 tablet (80 mg total) by mouth daily. 12/26/23  Yes Rojelio Nest, DO  Cholecalciferol 25 MCG (1000 UT) capsule Take 1,000 Units by mouth daily.   Yes [provider]  diltiazem  (CARDIZEM  CD) 120 MG 24 hr capsule Take 1 capsule (120 mg total) by mouth daily. 12/26/23  Yes Rojelio Nest, DO  docusate sodium  (COLACE) 100 MG capsule Take 1 capsule (100 mg total) by mouth daily as needed for mild constipation. 11/03/22  Yes Wert, Tawni, NP  Emollient (AQUAPHOR OINTMENT BODY EX) Apply 1 application  topically daily. To bilateral extremities   Yes [provider]  FLUoxetine (PROZAC) 10 MG capsule Take 10 mg by mouth daily.   Yes [provider]  folic acid  (FOLVITE ) 400 MCG tablet Take 400 mcg by mouth daily.   Yes [provider]  hydrocortisone 1 % ointment Apply 1 Application topically 3 (three) times daily as needed for itching.   Yes [provider]  hydrOXYzine  (ATARAX ) 25 MG tablet Take 25 mg by mouth every 6 (six) hours as needed for anxiety or itching.   Yes [provider]  mirtazapine  (REMERON ) 7.5 MG tablet Take 7.5 mg by mouth at bedtime.   Yes [provider]  traZODone  (DESYREL ) 50 MG tablet Take 50 mg by mouth at bedtime.   Yes [provider]  triamcinolone  cream (KENALOG ) 0.1 % Apply 1 Application topically 2 (two) times daily as needed (rash). Affected areas of leg   Yes [provider]    Allergies: Patient has no known allergies.    Review of Systems  Updated Vital Signs BP (!) 142/71   Pulse 78   Temp 98.1 F (36.7 C)   Resp 16   Ht 6' (1.829 m)   Wt 104.4 kg   SpO2 96%   BMI 31.22 kg/m   Physical Exam Vitals and nursing note reviewed.  Constitutional:      General: He is not in acute distress.    Appearance: He is not toxic-appearing.  HENT:  Head: Normocephalic and atraumatic.     Right Ear: Tympanic membrane normal.     Left Ear: Tympanic membrane normal.     Nose: Nose normal.     Mouth/Throat:     Mouth: Mucous membranes are moist.  Eyes:     Conjunctiva/sclera: Conjunctivae normal.  Cardiovascular:     Rate and Rhythm: Normal rate and regular rhythm.  Pulmonary:     Effort: Pulmonary effort is normal.     Breath sounds: Normal breath sounds.  Abdominal:     General: Abdomen is flat. There is no distension.     Palpations: Abdomen is soft.     Tenderness: There is no abdominal tenderness. There is no guarding or rebound.  Musculoskeletal:     Right lower leg: No edema.     Left lower leg: No edema.  Skin:     General: Skin is warm and dry.     Capillary Refill: Capillary refill takes less than 2 seconds.  Neurological:     Mental Status: He is alert and oriented to person, place, and time.     Comments: Patient has some residual left sided deficits from prior stroke which she reports is at baseline.  He is slightly dysarthric, but was in the hospital last week for TIA/stroke with primary symptom of dysarthria he reports that it is essentially at his baseline since leaving the hospital.  Psychiatric:        Mood and Affect: Mood normal.        Behavior: Behavior normal.     (all labs ordered are listed, but only abnormal results are displayed) Labs Reviewed  COMPREHENSIVE METABOLIC PANEL WITH GFR - Abnormal; Notable for the following components:      Result Value   Potassium 3.4 (*)    Glucose, Bld 105 (*)    Creatinine, Ser 1.29 (*)    Calcium  8.5 (*)    Albumin 3.3 (*)    ALT 67 (*)    Alkaline Phosphatase 136 (*)    GFR, Estimated 54 (*)    All other components within normal limits  CBC WITH DIFFERENTIAL/PLATELET - Abnormal; Notable for the following components:   WBC 15.0 (*)    Neutro Abs 11.0 (*)    Monocytes Absolute 1.5 (*)    Abs Immature Granulocytes 0.37 (*)    All other components within normal limits  URINALYSIS, ROUTINE W REFLEX MICROSCOPIC - Abnormal; Notable for the following components:   Color, Urine STRAW (*)    All other components within normal limits  I-STAT VENOUS BLOOD GAS, ED - Abnormal; Notable for the following components:   pH, Ven 7.444 (*)    pCO2, Ven 43.2 (*)    pO2, Ven 53 (*)    Bicarbonate 29.6 (*)    Acid-Base Excess 5.0 (*)    Calcium , Ion 1.10 (*)    All other components within normal limits  RESP PANEL BY RT-PCR (RSV, FLU A&B, COVID)  RVPGX2  MRSA NEXT GEN BY PCR, NASAL  EXPECTORATED SPUTUM ASSESSMENT W GRAM STAIN, RFLX TO RESP C  STREP PNEUMONIAE URINARY ANTIGEN  PROCALCITONIN  LEGIONELLA PNEUMOPHILA SEROGP 1 UR AG  I-STAT CG4 LACTIC  ACID, ED  CBG MONITORING, ED    EKG: EKG Interpretation Date/Time:  Sunday December 30 2023 07:11:07 EDT Ventricular Rate:  77 PR Interval:  200 QRS Duration:  98 QT Interval:  419 QTC Calculation: 475 R Axis:   -30  Text Interpretation: Sinus rhythm Atrial premature complex Left axis  deviation Nonspecific T abnormalities, lateral leads Confirmed by Neysa Clap [phone]) on 12/30/2023 7:58:01 AM  Radiology: MR BRAIN WO CONTRAST Result Date: 12/30/2023 EXAM: MR Brain without Intravenous Contrast. CLINICAL HISTORY: Stroke, follow up. TECHNIQUE: Magnetic resonance images of the brain without intravenous contrast in multiple planes. CONTRAST: Without. COMPARISON: None provided. FINDINGS: BRAIN: Remtoe right MCA territory infarct. Bilateral cerebellar infarcts. Scattered T2/FLAIR hyperintensities in the white matter, compatible with chronic microvascular ischemic disease. New acute infarcts in the right frontal lobe. Evolving subacute infarct in the left posterior frontal lobe. Cerebral atrophy. No intracranial mass or hemorrhage. No midline shift or extra-axial fluid collection. The central arterial and venous flow voids are patent. VENTRICLES: No hydrocephalus. ORBITS: The orbits are normal. SINUSES AND MASTOIDS: The sinuses and mastoid air cells are clear. BONES: No focal osseous lesion. Other: Chronic 1.3 cm right parotid lesion. IMPRESSION: 1. New acute infarcts in the right frontal lobe. 2. Evolving subacute infarct in the left posterior frontal lobe. 3. Remote right MCA territory and cerebellar infarcts. Electronically signed by: Gilmore Molt MD 12/30/2023 01:26 PM EDT RP Workstation: [id number]   DG Chest 2 View Result Date: 12/30/2023 CLINICAL DATA:  ams EXAM: CHEST - 2 VIEW COMPARISON:  December 22, 2023 FINDINGS: Evaluation is limited by rotation. The cardiomediastinal silhouette is unchanged in contour.Elevation of the RIGHT hemidiaphragm. Possible trace RIGHT pleural effusion. No  pneumothorax. RIGHT lower lobe heterogeneous opacities. Similar RIGHT middle lobe volume loss and opacity. Increased basilar lateral airspace opacity within the RIGHT lung. Visualized abdomen is unremarkable. Multilevel degenerative changes of the thoracic spine. IMPRESSION: 1. Increased RIGHT basilar airspace opacity. Differential considerations include atelectasis, aspiration or infection. Electronically Signed   By: Corean Salter M.D.   On: 12/30/2023 08:06   CT Head Wo Contrast Result Date: 12/30/2023 EXAM: CT HEAD WITHOUT CONTRAST 12/30/2023 07:30:05 AM TECHNIQUE: CT of the head was performed without the administration of intravenous contrast. Automated exposure control, iterative reconstruction, and/or weight based adjustment of the mA/kV was utilized to reduce the radiation dose to as low as reasonably achievable. COMPARISON: Brain MRI 12/23/2023 and Head CT 12/22/2023. CLINICAL HISTORY: 89 year old male. Mental status change, unknown cause. AMS. Small acute infarcts in the posterior left frontal lobe and the right cerebellum on MRI this month. FINDINGS: BRAIN AND VENTRICLES: No acute intracranial hemorrhage. No new cortically based infarct identified. Pronounced chronic encephalomalacia throughout the posterior right MCA territory is stable. Small infarcts in the posterior left frontal lobe and mid right cerebellum on MRI this month are recalled by CT. Chronic infarcts and encephalomalacia in those areas unchanged by CT. Chronic thalamic lacunar infarcts, chronic cerebral white matter hypodensity, and chronic right anterior external capsule lacunar infarcts are stable. Stable cerebral volume. No intracranial mass effect. No ventriculomegaly. No extra-axial collection. No midline shift. No suspicious intracranial vascular hyperdensity. Advanced calcified atherosclerosis. ORBITS: No acute orbital abnormality. SINUSES: Stable paranasal sinus, middle ear and mastoid aeration. SOFT TISSUES AND SKULL: No  acute soft tissue abnormality. No acute scalp soft tissue finding. No skull fracture. IMPRESSION: 1. No acute intracranial abnormality. 2. Recent small acute infarcts on MRI remain occult by CT. Stable other advanced chronic ischemic disease. Electronically signed by: Helayne Hurst MD 12/30/2023 07:55 AM EDT RP Workstation: [id number]     Procedures   Medications Ordered in the ED  cefTRIAXone (ROCEPHIN) 2 g in sodium chloride  0.9 % 100 mL IVPB (has no administration in time range)  sodium chloride  flush (NS) 0.9 % injection 3 mL (3 mLs Intravenous Not  Given 12/30/23 1035)  acetaminophen  (TYLENOL ) tablet 650 mg (has no administration in time range)    Or  acetaminophen  (TYLENOL ) suppository 650 mg (has no administration in time range)  guaiFENesin (MUCINEX) 12 hr tablet 600 mg (0 mg Oral Hold 12/30/23 1055)  albuterol (PROVENTIL) (2.5 MG/3ML) 0.083% nebulizer solution 2.5 mg (has no administration in time range)  ondansetron  (ZOFRAN ) tablet 4 mg (has no administration in time range)    Or  ondansetron  (ZOFRAN ) injection 4 mg (has no administration in time range)  azithromycin (ZITHROMAX) tablet 500 mg (has no administration in time range)  potassium chloride  10 mEq in 100 mL IVPB (0 mEq Intravenous Stopped 12/30/23 1554)  atorvastatin  (LIPITOR) tablet 80 mg (has no administration in time range)  FLUoxetine (PROZAC) capsule 10 mg (has no administration in time range)  mirtazapine  (REMERON ) tablet 7.5 mg (has no administration in time range)  traZODone  (DESYREL ) tablet 50 mg (has no administration in time range)  apixaban  (ELIQUIS ) tablet 5 mg (has no administration in time range)  amiodarone (PACERONE) tablet 100 mg (has no administration in time range)  diltiazem  (CARDIZEM  CD) 24 hr capsule 120 mg (has no administration in time range)  docusate sodium  (COLACE) capsule 100 mg (has no administration in time range)  folic acid  (FOLVITE ) tablet 0.5 mg (has no administration in time range)   Aquaphor Ointment Body AERO (has no administration in time range)  hydrocortisone 1 % ointment 1 Application (has no administration in time range)  lactated ringers bolus 1,000 mL (0 mLs Intravenous Stopped 12/30/23 0943)  cefTRIAXone (ROCEPHIN) 1 g in sodium chloride  0.9 % 100 mL IVPB (0 g Intravenous Stopped 12/30/23 1016)  azithromycin (ZITHROMAX) 500 mg in sodium chloride  0.9 % 250 mL IVPB (0 mg Intravenous Stopped 12/30/23 1127)  cefTRIAXone (ROCEPHIN) 1 g in sodium chloride  0.9 % 100 mL IVPB (0 g Intravenous Stopped 12/30/23 1514)    Clinical Course as of 12/30/23 1559  Sun Dec 30, 2023  0758 CT Head Wo Contrast IMPRESSION: 1. No acute intracranial abnormality. 2. Recent small acute infarcts on MRI remain occult by CT. Stable other advanced chronic ischemic disease.   [TY]  0801 DG Chest 2 View Appears similar to prior chest x-ray on October 4 per my independent review, maybe slightly worse.  [TY]  0809 DG Chest 2 View IMPRESSION: 1. Increased RIGHT basilar airspace opacity. Differential considerations include atelectasis, aspiration or infection.   Electronically Signed   By: Corean Salter M.D.   On: 12/30/2023 08:06   [TY]  0825 WBC(!): 15.0 Leukocytosis noted, does not appear he had significant leukocytosis when discharged from the hospital.  Worsening chest x-ray and reported worsening confusion.   [TY]  [id number] Lactic Acid, Venous: 1.2 Lactate not elevated. [TY]  0940 Given patient's advanced age, reported confusion with chest x-ray showing worsening pneumonia with uptrending white count will admit for IV antibiotics as he has been on oral antibiotics since discharge.  He is not in significant distress currently, maintaining oxygen saturation on room air. [TY]  [id number] Spoke to hospitalist for admission.  He is requesting a VBG to make sure patient is not retaining CO2.  Ordered.  But agrees to see and admit patient. [TY]    Clinical Course User Index [TY] Neysa Caron PARAS, DO                                 Medical Decision Making This is  an 89 year old male who with complex past medical history to include stroke with residual left arm weakness, hypertension, obesity,A-fib, CKD.  He is hypertensive 160/90, but nontachycardic afebrile not tachypneic maintaining oxygen saturation on room air.  EMS reports stable vitals and rounds.  On my exam he is alert and oriented x 3 and has no specific complaints.  He is dysarthric, but he reports that he is essentially at his baseline since leaving the hospital several days.  He has some residual left upper extremity weakness, but no other motor deficits.  Normal sensorium.  He has no specific complaints currently.  Will get repeat CT head as he is on Eliquis  and did recently have stroke per my chart review.  Will also get broad screening labs.  Clinically he does appear slightly dry will give some IV fluids.  Amount and/or Complexity of Data Reviewed Labs: ordered. Decision-making details documented in ED Course. Radiology: ordered and independent interpretation performed. Decision-making details documented in ED Course. ECG/medicine tests: independent interpretation performed.    Details: Appears to be normal sinus rhythm at a rate of 77 bpm.  Normal intervals.  No QTc prolongation.  No overt ischemic changes.  Risk Decision regarding hospitalization. Diagnosis or treatment significantly limited by social determinants of health. Risk Details: Lives at facility       Final diagnoses:  None    ED Discharge Orders     None          Neysa Caron PARAS, DO 12/30/23 1559

## 2023-12-30 NOTE — ED Notes (Signed)
 Pt linens, brief, and gown changed. Pt provided pericare after having BM

## 2023-12-30 NOTE — ED Notes (Signed)
 Patient transported to MRI

## 2023-12-30 NOTE — ED Triage Notes (Signed)
 Pt from [name], per facility pt placed call to call bell and RN said pt was altered, pt had TIA 8 days ago, and is on elliquis for blood thinner, pt has Left side weakness from previous stroke

## 2023-12-30 NOTE — ED Notes (Signed)
 Patient transported to CT

## 2023-12-30 NOTE — ED Notes (Signed)
 Courtesy call given to IP

## 2023-12-30 NOTE — H&P (Addendum)
 History and Physical    Patient: Edward Maynard FMW:[id number] DOB: Jun 18, 1933 DOA: 12/30/2023 DOS: the patient was seen and examined on 12/30/2023 PCP: Sherre Rea, FNP  Patient coming from: SNF via EMS  Chief Complaint:  Chief Complaint  Patient presents with   Altered Mental Status    Per facility pt was acting differently, no Know Last Well per facility, pt c/o weakness pt has previous TIA from 8 days ago    HPI: [name] is a 89 y.o. male with medical history significant of hypertension, hyperlipidemia, history of CVA with residual deficits, CKD 2, atrial fibrillation on Eliquis , depression, and anxiety presented  Patient with recent hospitalization from 10/4-10/7 after presenting with difficulty speaking and right-sided facial droop found to have acute left precentral gyrus and acute lacunar infarct of the right cerebral hemisphere.  Previously had been switched from Xarelto  to Eliquis  after having stroke back in February 2024.  Neurology consulted and recommended switching to Pradaxa but not covered by insurance for which patient was continued on Eliquis .  He was also treated for multifocal pneumonia with treated with antibiotics and course of steroids which were completed yesterday after being discharged to a rehab facility.  Patient expresses feeling tired since being discharged from the hospital, but denies having any significant cough or fever.  He was not on a modified diet to his knowledge and has not had any significant coughing or choking events with the eating.  Although he does report still having a mild cough.   Per review of EMS run sheet at the facility staff reported that his speech  maybemore slurred and felt off while getting him up to the chair.    His daughter  makes note that he seemed to be talking better yesterday and had drank a milkshake when they visited him.  She is not sure if that is because he does not have his dentures currently in place.  In the  ED patient was noted to be afebrile with blood pressures elevated up to 160/90, and all other vital signs maintained.  Labs significant for WBC 15, thanks potassium 3.4, BUN 19, creatinine 1.27, glucose 187, and alkaline phosphatase 145.  Chest x-ray noted increased right basilar airspace opacity concerning for atelectasis, aspiration, versus infection.  Patient had been ordered 1 L of lactated Ringer's, Rocephin, and azithromycin.  Review of Systems: As mentioned in the history of present illness. All other systems reviewed and are negative. Past Medical History:  Diagnosis Date   High cholesterol    05/2013; no defecits   Hyperlipemia    Left arm weakness    Left hand weakness    Personal history of colonic polyp - adenoma  10/23/2013   Stroke (HCC) 05/2013   MRI on 07/02/13 = Multifocal acute & subacute infarction involving right frontal MCA/ACA & left parietal MCA/PCA watershed areas   Past Surgical History:  Procedure Laterality Date   IR CT HEAD LTD  05/10/2022   IR PERCUTANEOUS ART THROMBECTOMY/INFUSION INTRACRANIAL INC DIAG ANGIO  05/10/2022   LOOP RECORDER IMPLANT N/A 10/31/2013   Procedure: LOOP RECORDER IMPLANT;  Surgeon: Lynwood JONETTA Rakers, MD;  Location: [facility];  Service: Cardiovascular;  Laterality: N/A;   RADIOLOGY WITH ANESTHESIA N/A 05/10/2022   Procedure: IR WITH ANESTHESIA;  Surgeon: Dolphus Carrion, MD;  Location: [facility] OR;  Service: Radiology;  Laterality: N/A;   SHOULDER SURGERY Bilateral 2003, 1995   Social History:  reports that he quit smoking about 53 years ago. His smoking  use included cigarettes. He has never used smokeless tobacco. He reports that he does not drink alcohol and does not use drugs.  No Known Allergies  Family History  Problem Relation Age of Onset   Hypertension Mother    Hypertension Father    Stroke Father    COPD Brother    Stroke Brother    Colon cancer Neg Hx    Heart attack Neg Hx     Prior to Admission medications   Medication Sig  Start Date End Date Taking? Authorizing Provider  acetaminophen  (TYLENOL ) 325 MG tablet Take 2 tablets (650 mg total) by mouth every 4 (four) hours as needed for mild pain (or temp > 37.5 C (99.5 F)). 06/07/22   Angiulli, Toribio PARAS, PA-C  amiodarone (PACERONE) 100 MG tablet Take 1 tablet (100 mg total) by mouth daily. 12/26/23 01/25/24  Rojelio Nest, DO  apixaban  (ELIQUIS ) 5 MG TABS tablet Take 1 tablet (5 mg total) by mouth 2 (two) times daily. 05/18/22   Waddell Karna LABOR, NP  atorvastatin  (LIPITOR) 80 MG tablet Take 1 tablet (80 mg total) by mouth daily. 12/26/23   Rojelio Nest, DO  Cholecalciferol 25 MCG (1000 UT) capsule Take 1,000 Units by mouth daily.    [provider]  diltiazem  (CARDIZEM  CD) 120 MG 24 hr capsule Take 1 capsule (120 mg total) by mouth daily. 12/26/23   Rojelio Nest, DO  docusate sodium  (COLACE) 100 MG capsule Take 1 capsule (100 mg total) by mouth daily as needed for mild constipation. 11/03/22   Darlean Maus, NP  Emollient (AQUAPHOR OINTMENT BODY EX) Apply 1 application  topically daily. To bilateral extremities    [provider]  FLUoxetine (PROZAC) 10 MG capsule Take 10 mg by mouth daily.    [provider]  folic acid  (FOLVITE ) 400 MCG tablet Take 400 mcg by mouth daily.    [provider]  hydrocortisone 1 % ointment Apply 1 Application topically 3 (three) times daily as needed for itching.    [provider]  hydrOXYzine  (ATARAX ) 25 MG tablet Take 25 mg by mouth every 6 (six) hours as needed for anxiety or itching.    [provider]  mirtazapine  (REMERON ) 7.5 MG tablet Take 7.5 mg by mouth at bedtime.    [provider]  traZODone  (DESYREL ) 50 MG tablet Take 50 mg by mouth at bedtime.    [provider]  triamcinolone  cream (KENALOG ) 0.1 % Apply 1 Application topically 2 (two) times daily as needed (rash). Affected areas of leg    [provider]    Physical Exam: Vitals:   12/30/23  0705 12/30/23 0706 12/30/23 0748 12/30/23 0900  BP: (!) 160/90  (!) 153/88 (!) 158/96  Pulse: 75  75 73  Resp: 16  18 12   Temp: 97.9 F (36.6 C)     TempSrc: Oral     SpO2:   95% 93%  Weight:  104.4 kg    Height:  6' (1.829 m)     Constitutional: Elderly male currently in NAD, calm, comfortable Eyes: PERRL, lids and conjunctivae normal ENMT: Mucous membranes are moist.  Right-sided facial droop.  Edentulous. Neck: normal, supple,  Respiratory: clear to auscultation bilaterally, no wheezing, no crackles. Normal respiratory effort.  Cardiovascular: Regular rate and rhythm, no murmurs / rubs / gallops.  1+ pitting edema noted of the lower extremities. Abdomen: no tenderness, no masses palpated. Bowel sounds positive.  Musculoskeletal: no clubbing / cyanosis. No joint deformity upper and lower  extremities.  Skin: no rashes, lesions, ulcers. No induration Neurologic: CN 2-12 grossly intact.  Left-sided weakness present with slurred speech. Psychiatric: Normal judgment and insight. Alert and oriented x person place and time.  Normal mood  Data Reviewed:  EKG reveals sinus rhythm at 77 bpm with premature atrial complex.  Reviewed labs, imaging, and pertinent records as documented.  Assessment and Plan:  Acute Encephalopathy Patient was reported to be altered this morning by staff, but not totally clear if he was significantly off from his baseline as there was no one there to confirm.  At this time patient seems to be alert and oriented x 3 although has some mild dysarthria unclear if this is related to him not having dentures.  CT scan of the head did not reveal any acute abnormality.  Question if this is related to patient not having his dentures versus possibility of recrudescence of recent stroke related to infection versus possibility of a new stroke as the cause for differences. - Admit to a telemetry bed - Neurochecks - Check venous blood gas  - Check MRI of the brain  History of  recent pneumonia Possible aspiration Patient had just completed antibiotics and steroid taper for multifocal pneumonia during his most recent hospitalization yesterday.  Repeat chest x-ray noted increased right basilar airspace disease given concern for aspiration versus atelectasis versus infection. - Aspiration precautions with elevation head of bed - Check MRSA screen and respiratory virus panel - Check procalcitonin - Continue empiric antibiotics of Rocephin and azithromycin - Speech therapy consulted for evaluation  History of CVA with residual deficit Patient with prior history of stroke back in 2024 for which led to significant left-sided weakness which he has basically been bedbound.  At that time switch from Xarelto  to Eliquis .  Subsequently, had a new stroke 10/4 with right sided facial droop and dysarthria.  Discussed switching to Pradaxa but insurance would not cover and therefore continued on Eliquis . - PT/OT/speech to evaluate and treat  Hypokalemia Acute.  Initial potassium noted to be 3.4. - Give potassium chloride  20 mEq IV - Continue to monitor and replace as needed  Paroxysmal atrial fibrillation on chronic anticoagulation Patient appears to be in a sinus rhythm at this time.   - Continue amiodarone, Cardizem , and Eliquis   Hyperlipidemia - Continue atorvastatin    CKD stage IIIa Stable.  On admission creatinine noted to be 1.27 with BUN 19.  Baseline Cr 1.1-1.2   - Continue to monitor  Prediabetes On admission glucose elevated up to 197.  Last Hemoglobin A1c 5.6.  Depression/anxiety - Continue Prozac, Remeron , trazodone    Right parotid lesion Previously recommended outpatient follow-up during last hospitalization.   Ascending aortic dilatation at 4.5 cm - Recommend semi-annual imaging followup by CTA or MRA    DVT prophylaxis: Eliquis  Advance Care Planning:   Code Status: Limited: Do not attempt resuscitation (DNR) -DNR-LIMITED -Do Not Intubate/DNI     Consults: None  Family Communication: [name] and grandson updated   Severity of Illness: The appropriate patient status for this patient is INPATIENT. Inpatient status is judged to be reasonable and necessary in order to provide the required intensity of service to ensure the patient's safety. The patient's presenting symptoms, physical exam findings, and initial radiographic and laboratory data in the context of their chronic comorbidities is felt to place them at high risk for further clinical deterioration. Furthermore, it is not anticipated that the patient will be medically stable for discharge from the hospital within 2 midnights of  admission.   * I certify that at the point of admission it is my clinical judgment that the patient will require inpatient hospital care spanning beyond 2 midnights from the point of admission due to high intensity of service, high risk for further deterioration and high frequency of surveillance required.*  Author: Maximino DELENA Sharps, MD 12/30/2023 9:42 AM  For on call review [email or web address].

## 2023-12-30 NOTE — ED Notes (Addendum)
 IP RN informed ED RN pt will be placed on neuro tele.  ED CN informed

## 2023-12-30 NOTE — Evaluation (Signed)
 Clinical/Bedside Swallow Evaluation Patient Details  Name: Edward Maynard MRN: [id number] Date of Birth: 1934-03-16  Today's Date: 12/30/2023 Time: SLP Start Time (ACUTE ONLY): 1405 SLP Stop Time (ACUTE ONLY): 1420 SLP Time Calculation (min) (ACUTE ONLY): 15 min  Past Medical History:  Past Medical History:  Diagnosis Date   High cholesterol    05/2013; no defecits   Hyperlipemia    Left arm weakness    Left hand weakness    Personal history of colonic polyp - adenoma  10/23/2013   Stroke (HCC) 05/2013   MRI on 07/02/13 = Multifocal acute & subacute infarction involving right frontal MCA/ACA & left parietal MCA/PCA watershed areas   Past Surgical History:  Past Surgical History:  Procedure Laterality Date   IR CT HEAD LTD  05/10/2022   IR PERCUTANEOUS ART THROMBECTOMY/INFUSION INTRACRANIAL INC DIAG ANGIO  05/10/2022   LOOP RECORDER IMPLANT N/A 10/31/2013   Procedure: LOOP RECORDER IMPLANT;  Surgeon: Lynwood JONETTA Rakers, MD;  Location: [facility];  Service: Cardiovascular;  Laterality: N/A;   RADIOLOGY WITH ANESTHESIA N/A 05/10/2022   Procedure: IR WITH ANESTHESIA;  Surgeon: Dolphus Carrion, MD;  Location: [facility] OR;  Service: Radiology;  Laterality: N/A;   SHOULDER SURGERY Bilateral 2003, 1995   HPI:  Edward Maynard is a 89 y.o. male with medical history significant of hypertension, hyperlipidemia, history of CVA with residual deficits, CKD 2, atrial fibrillation on Eliquis , depression, and anxiety.  Pt was seen by ST in [facility] in 04/2022 and was seen most recently for a cognitive-linguistic evaluation on 12/24/23 following a CVA.  MRI remarkable for acute infarcts in the R frontal lobe and evolving subacute infarcts in the L posterior frontal lobe.    Assessment / Plan / Recommendation  Clinical Impression  Pt was seen for a clinical swallow evaluation and presents with mild oral dysphagia with suspected functional pharyngeal phase of the swallow.  Pt exhibited a delayed cough with the first trial  of an ice chip, but no other overt s/sx of aspiration were noted with additional ice chip trials or with thin liquid, puree, or regular solids.  Mastication of regular solids was mildly prolonged, but it was effective.  Recommend initiation of Dysphagia 3 (soft) solids and thin liquids with medication administered whole in puree.  Pt and family were agreeable with recommendations.  SLP will briefly f/u to monitor diet tolerance.  SLP Visit Diagnosis: Dysphagia, oral phase (R13.11)    Aspiration Risk  Mild aspiration risk    Diet Recommendation Dysphagia 3 (Mech soft);Thin liquid    Liquid Administration via: Cup;Straw Medication Administration: Whole meds with puree Supervision: Intermittent supervision to cue for compensatory strategies;Patient able to self feed Compensations: Minimize environmental distractions;Slow rate;Small sips/bites    Other  Recommendations Oral Care Recommendations: Oral care BID     Assistance Recommended at Discharge    Functional Status Assessment Patient has had a recent decline in their functional status and demonstrates the ability to make significant improvements in function in a reasonable and predictable amount of time.  Frequency and Duration min 2x/week  2 weeks       Prognosis Prognosis for improved oropharyngeal function: Fair      Swallow Study   General Date of Onset: 12/30/23 HPI: Edward Maynard is a 89 y.o. male with medical history significant of hypertension, hyperlipidemia, history of CVA with residual deficits, CKD 2, atrial fibrillation on Eliquis , depression, and anxiety.  Pt was seen by ST in [facility] in 04/2022 and was  seen most recently for a cognitive-linguistic evaluation on 12/24/23 following a CVA.  MRI remarkable for acute infarcts in the R frontal lobe and evolving subacute infarcts in the L posterior frontal lobe. Type of Study: Bedside Swallow Evaluation Previous Swallow Assessment: See HPI Diet Prior to this Study:  NPO Temperature Spikes Noted: No Respiratory Status: Room air History of Recent Intubation: No Behavior/Cognition: Alert;Cooperative;Pleasant mood Oral Cavity Assessment: Within Functional Limits Oral Care Completed by SLP: No Oral Cavity - Dentition: Dentures, top;Dentures, bottom Vision: Functional for self-feeding Self-Feeding Abilities: Able to feed self Patient Positioning: Upright in bed Baseline Vocal Quality: Normal Volitional Cough: Strong Volitional Swallow: Able to elicit    Oral/Motor/Sensory Function Overall Oral Motor/Sensory Function: Mild impairment Lingual Strength: Reduced   Ice Chips Ice chips: Impaired Presentation: Spoon Pharyngeal Phase Impairments: Cough - Delayed   Thin Liquid Thin Liquid: Within functional limits Presentation: Spoon;Straw    Nectar Thick Nectar Thick Liquid: Within functional limits   Honey Thick Honey Thick Liquid: Not tested   Puree Puree: Within functional limits Presentation: Spoon   Solid     Solid: Impaired Presentation: Self Fed Oral Phase Functional Implications: Prolonged oral transit     Earnie Cable, M.S., CCC-SLP Acute Rehabilitation Services Office: [phone]  Earnie SQUIBB Nikitas Davtyan 12/30/2023,4:22 PM

## 2023-12-30 NOTE — ED Notes (Signed)
 RN and NT assisted pt on steady to go to restroom. Pt is two assist.

## 2023-12-30 NOTE — ED Notes (Signed)
 Patient returned from CT

## 2023-12-30 NOTE — ED Notes (Signed)
 Pt provided warm blanket.

## 2023-12-31 DIAGNOSIS — G9340 Encephalopathy, unspecified: Secondary | ICD-10-CM | POA: Diagnosis present

## 2023-12-31 DIAGNOSIS — I480 Paroxysmal atrial fibrillation: Secondary | ICD-10-CM

## 2023-12-31 DIAGNOSIS — Z7901 Long term (current) use of anticoagulants: Secondary | ICD-10-CM | POA: Diagnosis not present

## 2023-12-31 DIAGNOSIS — I6340 Cerebral infarction due to embolism of unspecified cerebral artery: Secondary | ICD-10-CM | POA: Diagnosis not present

## 2023-12-31 DIAGNOSIS — I69391 Dysphagia following cerebral infarction: Secondary | ICD-10-CM | POA: Diagnosis not present

## 2023-12-31 DIAGNOSIS — I639 Cerebral infarction, unspecified: Secondary | ICD-10-CM | POA: Diagnosis not present

## 2023-12-31 DIAGNOSIS — R29706 NIHSS score 6: Secondary | ICD-10-CM | POA: Diagnosis not present

## 2023-12-31 LAB — COMPREHENSIVE METABOLIC PANEL WITH GFR
ALT: 48 U/L — ABNORMAL HIGH (ref 0–44)
AST: 20 U/L (ref 15–41)
Albumin: 3.1 g/dL — ABNORMAL LOW (ref 3.5–5.0)
Alkaline Phosphatase: 132 U/L — ABNORMAL HIGH (ref 38–126)
Anion gap: 10 (ref 5–15)
BUN: 16 mg/dL (ref 8–23)
CO2: 25 mmol/L (ref 22–32)
Calcium: 8.4 mg/dL — ABNORMAL LOW (ref 8.9–10.3)
Chloride: 101 mmol/L (ref 98–111)
Creatinine, Ser: 1.27 mg/dL — ABNORMAL HIGH (ref 0.61–1.24)
GFR, Estimated: 55 mL/min — ABNORMAL LOW (ref >60)
Glucose, Bld: 113 mg/dL — ABNORMAL HIGH (ref 70–99)
Potassium: 3.6 mmol/L (ref 3.5–5.1)
Sodium: 136 mmol/L (ref 135–145)
Total Bilirubin: 1 mg/dL (ref 0.0–1.2)
Total Protein: 6.4 g/dL — ABNORMAL LOW (ref 6.5–8.1)

## 2023-12-31 LAB — CBC
HCT: 46.2 % (ref 39.0–52.0)
Hemoglobin: 15.4 g/dL (ref 13.0–17.0)
MCH: 31.8 pg (ref 26.0–34.0)
MCHC: 33.3 g/dL (ref 30.0–36.0)
MCV: 95.5 fL (ref 80.0–100.0)
Platelets: 171 K/uL (ref 150–400)
RBC: 4.84 MIL/uL (ref 4.22–5.81)
RDW: 13.7 % (ref 11.5–15.5)
WBC: 11.6 K/uL — ABNORMAL HIGH (ref 4.0–10.5)
nRBC: 0 % (ref 0.0–0.2)

## 2023-12-31 LAB — PROCALCITONIN: Procalcitonin: <0.1 ng/mL

## 2023-12-31 MED ORDER — STROKE: EARLY STAGES OF RECOVERY BOOK
Freq: Once | Status: DC
Start: 2023-12-31 — End: 2024-01-01

## 2023-12-31 MED ORDER — ASPIRIN 81 MG PO TBEC: 81.0000 mg | DELAYED_RELEASE_TABLET | Freq: Every day | ORAL | Status: DC

## 2023-12-31 MED ORDER — ASPIRIN 81 MG PO TBEC: 81.0000 mg | DELAYED_RELEASE_TABLET | Freq: Every day | ORAL | Status: AC

## 2023-12-31 NOTE — Care Management Obs Status (Signed)
 MEDICARE OBSERVATION STATUS NOTIFICATION   Patient Details  Name: Edward Maynard MRN: [id number] Date of Birth: [birth year]   Medicare Observation Status Notification Given:  Yes    Andrez JULIANNA George, RN 12/31/2023, 4:07 PM

## 2023-12-31 NOTE — Progress Notes (Addendum)
 STROKE TEAM PROGRESS NOTE    SIGNIFICANT HOSPITAL EVENTS: 46/29 - 89 year old male who presented from assisted living facility with concern for altered mental status and increased confusion.   CT without acute abnormalities. MRI showed new acute infarcts in the right frontal lobe, evolving subacute infarct in the left posterior frontal lobe, and remote right MCA territory and cerebellar infarcts.  INTERIM HISTORY/SUBJECTIVE: Patient seen at bedside accompanied by son. Patient is A&Ox4. Patient has history of prior CVA's, and was most recently hospitalized earlier this month for TIA. He has some residual left upper extremity weakness from prior stroke and is dysarthric at baseline, but dysarthria appeared to be worsening recently. Discussed with patient and son option to continue Eliquis  and add on baby aspirin  daily, to which they were agreeable.   OBJECTIVE:  CBC    Component Value Date/Time   WBC 11.6 (H) 12/31/2023 0440   RBC 4.84 12/31/2023 0440   HGB 15.4 12/31/2023 0440   HGB 15.4 09/12/2021 1539   HCT 46.2 12/31/2023 0440   HCT 45.0 09/12/2021 1539   PLT 171 12/31/2023 0440   PLT 160 09/12/2021 1539   MCV 95.5 12/31/2023 0440   MCV 97 09/12/2021 1539   MCH 31.8 12/31/2023 0440   MCHC 33.3 12/31/2023 0440   RDW 13.7 12/31/2023 0440   RDW 12.8 09/12/2021 1539   LYMPHSABS 1.9 12/30/2023 0748   MONOABS 1.5 (H) 12/30/2023 0748   EOSABS 0.3 12/30/2023 0748   BASOSABS 0.1 12/30/2023 0748    BMET    Component Value Date/Time   NA 136 12/31/2023 0440   NA 142 09/28/2022 0000   K 3.6 12/31/2023 0440   CL 101 12/31/2023 0440   CO2 25 12/31/2023 0440   GLUCOSE 113 (H) 12/31/2023 0440   BUN 16 12/31/2023 0440   BUN 19 09/28/2022 0000   CREATININE 1.27 (H) 12/31/2023 0440   CALCIUM  8.4 (L) 12/31/2023 0440   EGFR 69 09/28/2022 0000   EGFR 63 09/12/2021 1539   GFRNONAA 55 (L) 12/31/2023 0440    IMAGING past 24 hours CT ANGIO HEAD NECK W WO CM Result Date:  12/31/2023 EXAM: CTA HEAD AND NECK WITH AND WITHOUT 12/30/2023 11:25:00 PM TECHNIQUE: CTA of the head and neck was performed with and without the administration of intravenous contrast. Multiplanar 2D and/or 3D reformatted images are provided for review. Automated exposure control, iterative reconstruction, and/or weight based adjustment of the mA/kV was utilized to reduce the radiation dose to as low as reasonably achievable. Stenosis of the internal carotid arteries measured using NASCET criteria. COMPARISON: Oc 4, 2025 CTA head/neck ple CLINICAL HISTORY: Neuro deficit, acute, stroke suspected. Chief complaints; Altered Mental Status; CT ANGIO HEAD NECK W WO CM; Neuro deficit, Acute, Stroke suspected; 75 [id number] GIVEN IN 18G IV LAC; Without Incident. FINDINGS: AORTIC ARCH AND ARCH VESSELS: Atherosclerosis. Great vessel origins are patent. CERVICAL CAROTID ARTERIES: Atherosclerosis and bilateral carotid bifurcations with 60 to 70% stenosis the left proximal ICA. No greater than 50% stenosis of the right ICA. Similar right ICA ulcerated atherosclerosis. CERVICAL VERTEBRAL ARTERIES: Similar sok i evere stenosis of the right vertebral origin. LUNGS AND MEDIASTINUM: Consolidative opacities in the right greater than left upper lobes. SOFT TISSUES: REDEMONSTRATED RIGHT PAROTID 12 MM LESION BONES: No acute abnormality. ANTERIOR CIRCULATION: No significant stenosis of the internal carotid arteries. No significant stenosis of the anterior cerebral arteries. Similar proximal right M2 MCA branch occlusion with distal reconstitution. No aneurysm. POSTERIOR CIRCULATION: No significant stenosis of the posterior cerebral arteries. No  significant stenosis of the basilar artery. No significant stenosis of the vertebral arteries. No aneurysm. OTHER: No dural venous sinus thrombosis on this non-dedicated study. IMPRESSION: 1. Similar proximal right M2 MCA branch occlusion with distal reconstitution. 2. Similar severe right vertebral  artery stenosis. 3. Similar 60-70% proximal left ICA stenosis. 4. Consolidative opacities in the right greater than left upper lobes, suspicious for pneumonia . 5. Unchanged 12 mm right parotid lesion suspicous for primary parotid neoplasm. Electronically signed by: Gilmore Molt MD 12/31/2023 12:21 AM EDT RP Workstation: [id number]   CT CHEST ABDOMEN PELVIS W CONTRAST Result Date: 12/30/2023 CLINICAL DATA:  Evaluate for possible metastatic disease EXAM: CT CHEST, ABDOMEN, AND PELVIS WITH CONTRAST TECHNIQUE: Multidetector CT imaging of the chest, abdomen and pelvis was performed following the standard protocol during bolus administration of intravenous contrast. RADIATION DOSE REDUCTION: This exam was performed according to the departmental dose-optimization program which includes automated exposure control, adjustment of the mA and/or kV according to patient size and/or use of iterative reconstruction technique. CONTRAST:  75mL OMNIPAQUE  IOHEXOL  350 MG/ML SOLN COMPARISON:  CT from 12/22/2023 FINDINGS: CT CHEST FINDINGS Cardiovascular: Atherosclerotic calcifications of the thoracic aorta are noted. Dilatation of the ascending aorta to 4.6 cm is again noted. Normal tapering in the aortic arch is noted. Descending aorta is within normal limits. No cardiac enlargement is seen. Mild coronary calcifications are noted. The pulmonary artery shows no large central pulmonary embolus although not timed for embolus evaluation. Mediastinum/Nodes: Thoracic inlet is within normal limits. Scattered small mediastinal lymph nodes are seen. No sizable adenopathy is noted. The esophagus as visualized is within normal limits. Lungs/Pleura: Elevation of the right hemidiaphragm is seen. Stable compressive atelectasis in the right lower lobe is noted. Small left-sided pleural effusion is noted. Some nodular appearing airspace opacity is again seen in the left base stable from the prior exam. Follow-up as previously described.  Musculoskeletal: Degenerative changes of the thoracic spine are noted. No acute rib abnormality is seen. CT ABDOMEN PELVIS FINDINGS Hepatobiliary: Fatty infiltration of the liver is noted. The gallbladder is within normal limits. Pancreas: Unremarkable. No pancreatic ductal dilatation or surrounding inflammatory changes. Spleen: Normal in size without focal abnormality. Adrenals/Urinary Tract: Adrenal glands are unremarkable. Kidneys demonstrate renal cystic change bilaterally stable from previous exams. No follow-up is recommended. No renal calculi or obstructive changes are seen. The bladder is well distended. Stomach/Bowel: No obstructive or inflammatory changes of the colon are seen. Minimal diverticular change is noted. The appendix is air-filled and within normal limits. Small bowel and stomach are within normal limits. Vascular/Lymphatic: Aortic atherosclerosis. No enlarged abdominal or pelvic lymph nodes. Reproductive: Prostate is unremarkable. Other: No abdominal wall hernia or abnormality. No abdominopelvic ascites. Musculoskeletal: Degenerative changes of lumbar spine are noted. IMPRESSION: CT of the chest: Dilatation of the ascending aorta to 4.6 cm. Patchy somewhat nodular airspace opacity in the left base stable from the prior exam of 8 days previous. Follow-up as previously described. Right basilar consolidation stable from the prior exam. CT of the abdomen and pelvis: Fatty liver. No acute abnormality noted. Electronically Signed   By: Oneil Devonshire M.D.   On: 12/30/2023 23:59   MR BRAIN WO CONTRAST Result Date: 12/30/2023 EXAM: MR Brain without Intravenous Contrast. CLINICAL HISTORY: Stroke, follow up. TECHNIQUE: Magnetic resonance images of the brain without intravenous contrast in multiple planes. CONTRAST: Without. COMPARISON: None provided. FINDINGS: BRAIN: Remtoe right MCA territory infarct. Bilateral cerebellar infarcts. Scattered T2/FLAIR hyperintensities in the white matter, compatible  with chronic microvascular  ischemic disease. New acute infarcts in the right frontal lobe. Evolving subacute infarct in the left posterior frontal lobe. Cerebral atrophy. No intracranial mass or hemorrhage. No midline shift or extra-axial fluid collection. The central arterial and venous flow voids are patent. VENTRICLES: No hydrocephalus. ORBITS: The orbits are normal. SINUSES AND MASTOIDS: The sinuses and mastoid air cells are clear. BONES: No focal osseous lesion. Other: Chronic 1.3 cm right parotid lesion. IMPRESSION: 1. New acute infarcts in the right frontal lobe. 2. Evolving subacute infarct in the left posterior frontal lobe. 3. Remote right MCA territory and cerebellar infarcts. Electronically signed by: Gilmore Molt MD 12/30/2023 01:26 PM EDT RP Workstation: [id number]    Vitals:   12/31/23 0500 12/31/23 0600 12/31/23 0813 12/31/23 0903  BP: (!) 148/70 (!) 148/81  (!) 144/70  Pulse: 76 74  (!) 129  Resp: 10 18  (!) 24  Temp:   98.5 F (36.9 C)   TempSrc:   Oral   SpO2: 97% 90%  95%  Weight:      Height:         PHYSICAL EXAM: General:  Alert, well-nourished, well-developed patient in no acute distress Psych:  Mood and affect appropriate for situation CV: Regular rate and rhythm on monitor Respiratory: Regular, unlabored respirations on room air   NEURO:  Mental Status: AA&Ox3, patient answers questions appropriately Speech/Language: mildly dysarthric speech. No aphasia. Fluency, naming, repetition and comprehension intact.  Cranial Nerves:  II: PERRL. Left lower quadrantanopia III, IV, VI: EOMI. Eyelids elevate symmetrically.  V: Sensation is intact to light touch and symmetrical to face.  VII: New R facial droop. L facial droop from prior stroke. VIII: hearing intact to voice. IX, X: Palate elevates symmetrically. Phonation is normal.  XI: Shoulder shrug 5/5. XII: tongue is midline without fasciculations. Motor:  LUE 3+/5 proximally but hand DF/PF and hand grip  3-/5. RUE proximal 3/5 due to chronic rotator cuff injury, but hand grip 5/5. Bilaterally LEs 4/5 proximal and distal, no drift Tone: is normal and bulk is normal Sensation- Sensation decreased on the left on light touch. Extinction absent to light touch to DSS.   Coordination: FTN intact bilaterally, HKS: no ataxia in BLE.No drift.  Gait- deferred  Most Recent NIH: 6   ASSESSMENT/PLAN:  [name] is a 89 y.o. male with history of HTN, HLD, Afib on Eliquis , CKD, hx of CVA with residual deficits admitted for altered mental status, increased confusion, and worsening dysarthria. NIH on Admission 6.  On assessment today, patient is alert and oriented to person, place, and time. Per son, patient with worsened dysarthria compared to baseline. Patient recently admitted at [facility] from 10/4-10/7 for TIA, and attempted to switch from Pradaxa to Eliquis , but was unaffordable with his insurance. Patient reporting compliance with prescribed Eliquis . Unfortunately, patient found to have new acute infarcts of R frontal lobe on MRI this admission. Given limited anticoagulant options, recommend continuing Eliquis  5 mg BID and adding on daily aspirin  81 mg indefinitely.  Stroke: right frontal lobe and evolving L posterior frontal lobe infarcts Etiology:  cardioembolic even on eliquis  for PAF Code Stroke CT head: No acute abnormality. Recent small acute infarcts on MRI remain occult by CT.  CTA head & neck: Similar proximal right M2 MCA branch occlusion with distal reconstitution. Similar severe right vertebral artery stenosis. Similar 60-70% proximal left ICA stenosis.  MRI: New acute infarcts in the right frontal lobe. Evolving subacute infarct in the left posterior frontal lobe. Remote right  MCA territory and cerebellar infarcts. 2D Echo EF 60-65%, normal left atrial size LDL 78 HgbA1c 5.6 VTE prophylaxis - Eliquis  Patient taking Eliquis  (apixaban ) daily prior to admission. Recommend continuing  Eliquis  5 mg BID and adding on daily aspirin  81 mg indefinitely Therapy recommendations: HH PT Disposition:  Pending  Hx of Stroke/TIA 3-06/2013 bilateral infarcts on MRI.  MRA, TTE, CUS all negative.  Discharged on aspirin  and Zocor .  30-day CardioNet monitoring negative for A-fib.  Loop recorder placed in 10/2013. 06/2014 found to have A-fib on loop recorder.  Started Xarelto  by cardiology 04/2022 admitted for right large MCA infarct with right M2 occlusion, status post IR with TICI2c.  However, post IR MRI still showing persistent M2 occlusion.  EF 60 to 65%, LDL 56, A1c 5.8.  Xarelto  and was switched to Eliquis , and continue Lipitor 20. Patient with recent hospitalization from 12/22/23-12/25/23 after presenting with difficulty speaking and right-sided facial droop found to have acute left precentral gyrus and acute lacunar infarct of the right cerebral hemisphere.  CTA head and neck right M2 more proximal occlusion with severe stenosis.  EF 60 to 65%.  LDL 78, A1c 5.6.  Pradaxa not covered by insurance, continue Eliquis  on discharge.  Pneumonia  CTA neck - Consolidative opacities in the right greater than left upper lobes, suspicious for pneumonia.  On azithromycin and Rocephin Management per primary team  Parotid lesion CTA head and neck showed Unchanged 12 mm right parotid lesion suspicous for primary parotid neoplasm. IR consulted, undergoing fine-needle biopsy  PAF Home Meds: Eliquis  Continue telemetry monitoring Continue anticoagulation with Eliquis   Amiodarone and cardizem    Hypertension Home meds: Norvasc  10 mg Stable Long term BP goal normotensive  Hyperlipidemia Home meds:  Atorvastatin  80 mg, resumed in hospital LDL 78, goal < 70 Continue statin at discharge  Dysphagia Patient has post-stroke dysphagia, SLP consulted Dysphagia 3 and thin liquid Advance diet as tolerated  Other Stroke Risk Factors Obesity, Body mass index is 31.22 kg/m., BMI >/= 30 associated with  increased stroke risk, recommend weight loss, diet and exercise as appropriate   Other Active Problems  Hypokalemia, supplement CKD Stage IIIa Cre 1.27 Depression/Anxiety  Hospital day # 1  ATTENDING NOTE: I reviewed above note and agree with the assessment and plan. Pt was seen and examined.   Son is at the bedside. Per son, pt left sided weakness seems at the baseline but dysarthria worsening from a couple days ago. Some intermittent confusion. On exam, pt lying in bed, awake, alert, eyes open, orientated to age, place, time and people. No aphasia, paucity of speech, answer questions appropriately but with mild dysarthria, following all simple commands. Able to name and repeat. No gaze palsy, tracking bilaterally, visual field exam showed left lower quadrantanopia. New R facial droop from recent stroke, still has old L facial droop. Tongue midline. LUE 3+/5 proximally but hand DF/PF and hand grip 3-/5. RUE proximal 3/5 due to chronic rotator cuff injury, but hand grip 5/5. Bilaterally LEs 4/5 proximal and distal, no drift. Sensation decreased on the left on light touch, b/l FTN intact but slow bilaterally, gait not tested.  Patient re-admitted for recurrent small embolic stroke even on Eliquis .  There is no optimal options for recurrent embolic stroke failed Eliquis  for afib.  Switching DOAC proven to be not beneficial.  Not indicated for Watchman device per guideline at this time.  Therefore, will add aspirin  81 on top of Eliquis  as further stroke prevention option at this time.  Discussed with  son and he is in agreement.  Continue statin.  PT and OT recommend home health.  For detailed assessment and plan, please refer to above as I have made changes wherever appropriate.   Neurology will sign off. Please call with questions. Pt will follow up with stroke clinic Dr. Rosemarie at [facility] in about 4 weeks. Thanks for the consult.   Ary Cummins, MD PhD Stroke Neurology 12/31/2023 2:21 PM    To  contact Stroke Continuity provider, please refer to [email or web address]. After hours, contact General Neurology

## 2023-12-31 NOTE — Discharge Summary (Addendum)
 Physician Discharge Summary   Edward Maynard:[id number] DOB: August 22, 1933 DOA: 12/30/2023  PCP: Sherre Rea, FNP  Admit date: 12/30/2023 Discharge date: 12/31/2023  Admitted From: [name] Disposition:  [name] Discharging physician: Alm Apo, MD Barriers to discharge: none  Recommendations at discharge: 1) Outpatient parotid gland biopsy ordered. Radiology department will be in contact to schedule  Discharge Condition: stable CODE STATUS: DNR Diet recommendation:  Diet Orders (From admission, onward)     Start     Ordered   12/30/23 1524  DIET DYS 3 Room service appropriate? Yes with Assist; Fluid consistency: Thin  Diet effective now       Question Answer Comment  Room service appropriate? Yes with Assist   Fluid consistency: Thin      12/30/23 1523            Hospital Course: Edward Maynard is a 89 y.o. male with medical history significant of hypertension, hyperlipidemia, history of CVA with LUE weakness residual deficits, CKD 2, atrial fibrillation on Eliquis , depression, and anxiety presented   Patient with recent hospitalization from 10/4-10/7 after presenting with difficulty speaking and right-sided facial droop found to have acute left precentral gyrus and acute lacunar infarct of the right cerebral hemisphere.  Previously had been switched from Xarelto  to Eliquis  after having stroke back in February 2024.  Neurology consulted and recommended switching to Pradaxa but not covered by insurance for which patient was continued on Eliquis .  He was also treated for multifocal pneumonia with treated with antibiotics and course of steroids which were completed prior to readmission  Patient expresses feeling tired since being discharged from the hospital, but denies having any significant cough or fever.  He was not on a modified diet to his knowledge and has not had any significant coughing or choking events with the eating.  Although he does report still  having a mild cough.    Per review of EMS run sheet at the facility staff reported that his speech  maybemore slurred and felt off while getting him up to the chair.     In the ED patient was noted to be afebrile with blood pressures elevated up to 160/90, and all other vital signs maintained.  Labs significant for WBC 15, thanks potassium 3.4, BUN 19, creatinine 1.27, glucose 187, and alkaline phosphatase 145.  Chest x-ray noted increased right basilar airspace opacity concerning for atelectasis, aspiration, versus infection.  Patient had been ordered 1 L of lactated Ringer's, Rocephin, and azithromycin.   Assessment and Plan:   Acute CVA - recurrent CVA; previous MRI brain on 12/23/2023 showed  Acute cortical infarct in the left precentral gyrus and acute lacunar infarct in the right cerebellar hemisphere. - Now, repeat MRI brain on 12/30/2023 shows new infarcts involving right frontal lobe and evolving subacute infarct of the left posterior frontal lobe.  Remote infarcts noted in the right MCA territory and cerebellum as previously mentioned - Unable to be switched from Eliquis  due to insurance issues - Neurology recommending to continue on Eliquis  and adding baby aspirin  indefinitely - Follow-up PT/OT evals.  Patient resides at SNF and mostly bedbound/wheelchair-bound - Continue Lipitor  Acute Encephalopathy - resolved  - Patient was exhibiting some altered mentation along with potential dysarthria - Admitted for stroke workup - As of 10/13 back to normal baseline  History of recent pneumonia Possible aspiration Patient had just completed antibiotics and steroid taper for multifocal pneumonia during his most recent hospitalization.  Repeat chest x-ray noted increased  right basilar airspace disease given concern for aspiration versus atelectasis versus infection. -Nontoxic-appearing and afebrile -Some leukocytosis on admission which may also be reactive.  Procalcitonin  negative -Discontinue antibiotics and monitor off -Evaluated by SLP.  Some delayed cough but no overt signs or symptoms of aspiration.  Continue dysphagia 3 diet   History of CVA with residual deficit Patient with prior history of stroke back in 2024 for which led to significant left-sided weakness which he has basically been bedbound.  At that time switch from Xarelto  to Eliquis .  Subsequently, had a new stroke 10/4 with right sided facial droop and dysarthria.  Discussed switching to Pradaxa but insurance would not cover and therefore continued on Eliquis . - Residual left-sided weakness.  Right parotid lesion Previously recommended outpatient follow-up during last hospitalization. - Attempted to accomplish during hospitalization however radiology schedule full after trying to incorporate patient -Outpatient biopsy will be planned and radiology department aware to contact patient for scheduling biopsy   Hypokalemia -Replete as needed   PAF Patient appears to be in a sinus rhythm at this time.   - Continue amiodarone, Cardizem , and Eliquis    Hyperlipidemia - Continue atorvastatin     CKD stage IIIa Stable.  On admission creatinine noted to be 1.27 with BUN 19.  Baseline Cr 1.1-1.2   - Continue to monitor   Prediabetes On admission glucose elevated up to 197.  Last Hemoglobin A1c 5.6.  Depression/anxiety - Continue Prozac, Remeron , trazodone    Ascending aortic dilatation at 4.5 cm - Recommend semi-annual imaging followup by CTA or MRA    Principal Diagnosis: Acute CVA (cerebrovascular accident) [facility])  Discharge Diagnoses: Active Hospital Problems   Diagnosis Date Noted   Acute CVA (cerebrovascular accident) (HCC) 12/31/2023    Priority: 1.   History of stroke with current residual effects 05/10/2022    Priority: 3.   Hypokalemia 12/30/2023    Priority: 4.   Paroxysmal atrial fibrillation (HCC) 01/18/2015    Priority: 5.   Chronic anticoagulation 01/18/2015     Priority: 5.   Hyperlipidemia 12/12/2013    Priority: 6.   CKD (chronic kidney disease) 05/18/2022    Priority: 7.   Prediabetes 12/30/2023    Priority: 8.   Anxiety and depression 12/22/2023    Priority: 9.   Lesion of parotid gland 12/30/2023    Priority: 10.   AAA (abdominal aortic aneurysm) 12/30/2023    Priority: 11.   Acute encephalopathy 12/31/2023    Resolved Hospital Problems   Diagnosis Date Noted Date Resolved   Acute encephalopathy 12/30/2023 12/31/2023    Priority: 2.   History of recent pneumonia 12/30/2023 12/31/2023    Priority: 2.     Discharge Instructions     Ambulatory referral to Neurology   Complete by: As directed    Follow up with Dr. Rosemarie at [facility] in 4-6 weeks. Too complicated for NP to follow. Thanks.   Increase activity slowly   Complete by: As directed       Allergies as of 12/31/2023   No Known Allergies      Medication List     TAKE these medications    acetaminophen  325 MG tablet Commonly known as: TYLENOL  Take 2 tablets (650 mg total) by mouth every 4 (four) hours as needed for mild pain (or temp > 37.5 C (99.5 F)).   amiodarone 200 MG tablet Commonly known as: PACERONE Take 100 mg by mouth daily.   apixaban  5 MG Tabs tablet Commonly known as: ELIQUIS  Take 1 tablet (  5 mg total) by mouth 2 (two) times daily.   AQUAPHOR OINTMENT BODY EX Apply 1 application  topically daily. To bilateral extremities   aspirin  EC 81 MG tablet Take 1 tablet (81 mg total) by mouth daily. Swallow whole.   atorvastatin  80 MG tablet Commonly known as: LIPITOR Take 1 tablet (80 mg total) by mouth daily.   Cholecalciferol 25 MCG (1000 UT) capsule Take 1,000 Units by mouth daily.   diltiazem  120 MG 24 hr capsule Commonly known as: CARDIZEM  CD Take 1 capsule (120 mg total) by mouth daily.   docusate sodium  100 MG capsule Commonly known as: COLACE Take 1 capsule (100 mg total) by mouth daily as needed for mild constipation.   FLUoxetine 10  MG capsule Commonly known as: PROZAC Take 10 mg by mouth daily.   folic acid  400 MCG tablet Commonly known as: FOLVITE  Take 400 mcg by mouth daily.   hydrocortisone 1 % ointment Apply 1 Application topically 3 (three) times daily as needed for itching.   hydrOXYzine  25 MG tablet Commonly known as: ATARAX  Take 25 mg by mouth every 6 (six) hours as needed for anxiety or itching.   mirtazapine  7.5 MG tablet Commonly known as: REMERON  Take 7.5 mg by mouth at bedtime.   traZODone  50 MG tablet Commonly known as: DESYREL  Take 50 mg by mouth at bedtime.   triamcinolone  cream 0.1 % Commonly known as: KENALOG  Apply 1 Application topically 2 (two) times daily as needed (rash). Affected areas of leg        Follow-up Information     Rosemarie Eather RAMAN, MD. Schedule an appointment as soon as possible for a visit in 1 month(s).   Specialties: Neurology, Radiology Why: stroke clinic Contact information: [address] [place] [address] [phone]                No Known Allergies  Consultations: Neurology  Procedures:   Discharge Exam: BP 130/80 (BP Location: Right Arm)   Pulse (!) 51   Temp 98.5 F (36.9 C) (Oral)   Resp 18   Ht 6' (1.829 m)   Wt 104.4 kg   SpO2 93%   BMI 31.22 kg/m  Physical Exam Constitutional:      General: He is not in acute distress.    Appearance: Normal appearance.  HENT:     Head: Normocephalic and atraumatic.     Mouth/Throat:     Mouth: Mucous membranes are moist.  Eyes:     Extraocular Movements: Extraocular movements intact.  Cardiovascular:     Rate and Rhythm: Normal rate and regular rhythm.  Pulmonary:     Effort: Pulmonary effort is normal. No respiratory distress.     Breath sounds: Normal breath sounds. No wheezing.  Abdominal:     General: Bowel sounds are normal. There is no distension.     Palpations: Abdomen is soft.     Tenderness: There is no abdominal tenderness.  Musculoskeletal:         General: Normal range of motion.     Cervical back: Normal range of motion and neck supple.  Skin:    General: Skin is warm and dry.  Neurological:     Mental Status: He is alert.     Comments: Chronic left upper extremity and lower extremity residual weakness.  Mild left hand contracture.  No further dysarthria appreciated.  Right sided strength appears normal and intact 5/5  Psychiatric:        Mood and  Affect: Mood normal.        Behavior: Behavior normal.      The results of significant diagnostics from this hospitalization (including imaging, microbiology, ancillary and laboratory) are listed below for reference.   Microbiology: Recent Results (from the past 240 hours)  Resp panel by RT-PCR (RSV, Flu A&B, Covid) Anterior Nasal Swab     Status: None   Collection Time: 12/22/23  5:55 PM   Specimen: Anterior Nasal Swab  Result Value Ref Range Status   SARS Coronavirus 2 by RT PCR NEGATIVE NEGATIVE Final   Influenza A by PCR NEGATIVE NEGATIVE Final   Influenza B by PCR NEGATIVE NEGATIVE Final    Comment: (NOTE) The Xpert Xpress SARS-CoV-2/FLU/RSV plus assay is intended as an aid in the diagnosis of influenza from Nasopharyngeal swab specimens and should not be used as a sole basis for treatment. Nasal washings and aspirates are unacceptable for Xpert Xpress SARS-CoV-2/FLU/RSV testing.  Fact Sheet for Patients: BloggerCourse.com  Fact Sheet for Healthcare Providers: SeriousBroker.it  This test is not yet approved or cleared by the [place]  FDA and has been authorized for detection and/or diagnosis of SARS-CoV-2 by FDA under an Emergency Use Authorization (EUA). This EUA will remain in effect (meaning this test can be used) for the duration of the COVID-19 declaration under Section 564(b)(1) of the Act, 21 U.S.C. section 360bbb-3(b)(1), unless the authorization is terminated or revoked.     Resp Syncytial Virus by PCR  NEGATIVE NEGATIVE Final    Comment: (NOTE) Fact Sheet for Patients: BloggerCourse.com  Fact Sheet for Healthcare Providers: SeriousBroker.it  This test is not yet approved or cleared by the [place]  FDA and has been authorized for detection and/or diagnosis of SARS-CoV-2 by FDA under an Emergency Use Authorization (EUA). This EUA will remain in effect (meaning this test can be used) for the duration of the COVID-19 declaration under Section 564(b)(1) of the Act, 21 U.S.C. section 360bbb-3(b)(1), unless the authorization is terminated or revoked.  Performed at [facility], [address] [place], [place] [address]   MRSA Next Gen by PCR, Nasal     Status: None   Collection Time: 12/24/23  1:10 PM   Specimen: Nasal Mucosa; Nasal Swab  Result Value Ref Range Status   MRSA by PCR Next Gen NOT DETECTED NOT DETECTED Final    Comment: (NOTE) The GeneXpert MRSA Assay (FDA approved for NASAL specimens only), is one component of a comprehensive MRSA colonization surveillance program. It is not intended to diagnose MRSA infection nor to guide or monitor treatment for MRSA infections. Test performance is not FDA approved in patients less than 32 years old. Performed at [facility], [address], [place], [place] [address]   Resp panel by RT-PCR (RSV, Flu A&B, Covid) Anterior Nasal Swab     Status: None   Collection Time: 12/30/23 10:09 AM   Specimen: Anterior Nasal Swab  Result Value Ref Range Status   SARS Coronavirus 2 by RT PCR NEGATIVE NEGATIVE Final   Influenza A by PCR NEGATIVE NEGATIVE Final   Influenza B by PCR NEGATIVE NEGATIVE Final    Comment: (NOTE) The Xpert Xpress SARS-CoV-2/FLU/RSV plus assay is intended as an aid in the diagnosis of influenza from Nasopharyngeal swab specimens and should not be used as a sole basis for treatment. Nasal washings and aspirates are unacceptable for Xpert Xpress  SARS-CoV-2/FLU/RSV testing.  Fact Sheet for Patients: BloggerCourse.com  Fact Sheet for Healthcare Providers: SeriousBroker.it  This test  is not yet approved or cleared by the [place]  FDA and has been authorized for detection and/or diagnosis of SARS-CoV-2 by FDA under an Emergency Use Authorization (EUA). This EUA will remain in effect (meaning this test can be used) for the duration of the COVID-19 declaration under Section 564(b)(1) of the Act, 21 U.S.C. section 360bbb-3(b)(1), unless the authorization is terminated or revoked.     Resp Syncytial Virus by PCR NEGATIVE NEGATIVE Final    Comment: (NOTE) Fact Sheet for Patients: BloggerCourse.com  Fact Sheet for Healthcare Providers: SeriousBroker.it  This test is not yet approved or cleared by the [place]  FDA and has been authorized for detection and/or diagnosis of SARS-CoV-2 by FDA under an Emergency Use Authorization (EUA). This EUA will remain in effect (meaning this test can be used) for the duration of the COVID-19 declaration under Section 564(b)(1) of the Act, 21 U.S.C. section 360bbb-3(b)(1), unless the authorization is terminated or revoked.  Performed at [facility], [address] [place], [place] [address]      Labs: BNP (last 3 results) Recent Labs    12/22/23 1752  BNP 178.5*   Basic Metabolic Panel: Recent Labs  Lab 12/25/23 0517 12/30/23 0748 12/30/23 1228 12/31/23 0440  NA 138 142 140 136  K 4.0 3.4* 3.5 3.6  CL 105 101  --  101  CO2 23 27  --  25  GLUCOSE 152* 105*  --  113*  BUN 32* 19  --  16  CREATININE 1.34* 1.29*  --  1.27*  CALCIUM  8.9 8.5*  --  8.4*   Liver Function Tests: Recent Labs  Lab 12/30/23 0748 12/31/23 0440  AST 30 20  ALT 67* 48*  ALKPHOS 136* 132*  BILITOT 0.9 1.0  PROT 6.7 6.4*  ALBUMIN 3.3* 3.1*   No results for input(s): LIPASE,  AMYLASE in the last 168 hours. No results for input(s): AMMONIA in the last 168 hours. CBC: Recent Labs  Lab 12/30/23 0748 12/30/23 1228 12/31/23 0440  WBC 15.0*  --  11.6*  NEUTROABS 11.0*  --   --   HGB 15.6 14.6 15.4  HCT 45.9 43.0 46.2  MCV 92.7  --  95.5  PLT 200  --  171   Cardiac Enzymes: No results for input(s): CKTOTAL, CKMB, CKMBINDEX, TROPONINI in the last 168 hours. BNP: Invalid input(s): POCBNP CBG: No results for input(s): GLUCAP in the last 168 hours. D-Dimer No results for input(s): DDIMER in the last 72 hours. Hgb A1c No results for input(s): HGBA1C in the last 72 hours. Lipid Profile No results for input(s): CHOL, HDL, LDLCALC, TRIG, CHOLHDL, LDLDIRECT in the last 72 hours. Thyroid  function studies No results for input(s): TSH, T4TOTAL, T3FREE, THYROIDAB in the last 72 hours.  Invalid input(s): FREET3 Anemia work up No results for input(s): VITAMINB12, FOLATE, FERRITIN, TIBC, IRON, RETICCTPCT in the last 72 hours. Urinalysis    Component Value Date/Time   COLORURINE STRAW (A) 12/30/2023 1055   APPEARANCEUR CLEAR 12/30/2023 1055   LABSPEC 1.009 12/30/2023 1055   PHURINE 7.0 12/30/2023 1055   GLUCOSEU NEGATIVE 12/30/2023 1055   HGBUR NEGATIVE 12/30/2023 1055   BILIRUBINUR NEGATIVE 12/30/2023 1055   KETONESUR NEGATIVE 12/30/2023 1055   PROTEINUR NEGATIVE 12/30/2023 1055   NITRITE NEGATIVE 12/30/2023 1055   LEUKOCYTESUR NEGATIVE 12/30/2023 1055   Sepsis Labs Recent Labs  Lab 12/30/23 0748 12/31/23 0440  WBC 15.0* 11.6*   Microbiology Recent Results (from the past 240 hours)  Resp panel by RT-PCR (  RSV, Flu A&B, Covid) Anterior Nasal Swab     Status: None   Collection Time: 12/22/23  5:55 PM   Specimen: Anterior Nasal Swab  Result Value Ref Range Status   SARS Coronavirus 2 by RT PCR NEGATIVE NEGATIVE Final   Influenza A by PCR NEGATIVE NEGATIVE Final   Influenza B by PCR NEGATIVE NEGATIVE  Final    Comment: (NOTE) The Xpert Xpress SARS-CoV-2/FLU/RSV plus assay is intended as an aid in the diagnosis of influenza from Nasopharyngeal swab specimens and should not be used as a sole basis for treatment. Nasal washings and aspirates are unacceptable for Xpert Xpress SARS-CoV-2/FLU/RSV testing.  Fact Sheet for Patients: BloggerCourse.com  Fact Sheet for Healthcare Providers: SeriousBroker.it  This test is not yet approved or cleared by the [place]  FDA and has been authorized for detection and/or diagnosis of SARS-CoV-2 by FDA under an Emergency Use Authorization (EUA). This EUA will remain in effect (meaning this test can be used) for the duration of the COVID-19 declaration under Section 564(b)(1) of the Act, 21 U.S.C. section 360bbb-3(b)(1), unless the authorization is terminated or revoked.     Resp Syncytial Virus by PCR NEGATIVE NEGATIVE Final    Comment: (NOTE) Fact Sheet for Patients: BloggerCourse.com  Fact Sheet for Healthcare Providers: SeriousBroker.it  This test is not yet approved or cleared by the [place]  FDA and has been authorized for detection and/or diagnosis of SARS-CoV-2 by FDA under an Emergency Use Authorization (EUA). This EUA will remain in effect (meaning this test can be used) for the duration of the COVID-19 declaration under Section 564(b)(1) of the Act, 21 U.S.C. section 360bbb-3(b)(1), unless the authorization is terminated or revoked.  Performed at [facility], [address], [place], [place] [address]   MRSA Next Gen by PCR, Nasal     Status: None   Collection Time: 12/24/23  1:10 PM   Specimen: Nasal Mucosa; Nasal Swab  Result Value Ref Range Status   MRSA by PCR Next Gen NOT DETECTED NOT DETECTED Final    Comment: (NOTE) The GeneXpert MRSA Assay (FDA approved for NASAL specimens only), is one component of a  comprehensive MRSA colonization surveillance program. It is not intended to diagnose MRSA infection nor to guide or monitor treatment for MRSA infections. Test performance is not FDA approved in patients less than 10 years old. Performed at [facility], [address], [place], [place] [address]   Resp panel by RT-PCR (RSV, Flu A&B, Covid) Anterior Nasal Swab     Status: None   Collection Time: 12/30/23 10:09 AM   Specimen: Anterior Nasal Swab  Result Value Ref Range Status   SARS Coronavirus 2 by RT PCR NEGATIVE NEGATIVE Final   Influenza A by PCR NEGATIVE NEGATIVE Final   Influenza B by PCR NEGATIVE NEGATIVE Final    Comment: (NOTE) The Xpert Xpress SARS-CoV-2/FLU/RSV plus assay is intended as an aid in the diagnosis of influenza from Nasopharyngeal swab specimens and should not be used as a sole basis for treatment. Nasal washings and aspirates are unacceptable for Xpert Xpress SARS-CoV-2/FLU/RSV testing.  Fact Sheet for Patients: BloggerCourse.com  Fact Sheet for Healthcare Providers: SeriousBroker.it  This test is not yet approved or cleared by the [place]  FDA and has been authorized for detection and/or diagnosis of SARS-CoV-2 by FDA under an Emergency Use Authorization (EUA). This EUA will remain in effect (meaning this test can be used) for the duration of the COVID-19 declaration under Section 564(b)(1) of  the Act, 21 U.S.C. section 360bbb-3(b)(1), unless the authorization is terminated or revoked.     Resp Syncytial Virus by PCR NEGATIVE NEGATIVE Final    Comment: (NOTE) Fact Sheet for Patients: BloggerCourse.com  Fact Sheet for Healthcare Providers: SeriousBroker.it  This test is not yet approved or cleared by the [place]  FDA and has been authorized for detection and/or diagnosis of SARS-CoV-2 by FDA under an Emergency Use Authorization (EUA).  This EUA will remain in effect (meaning this test can be used) for the duration of the COVID-19 declaration under Section 564(b)(1) of the Act, 21 U.S.C. section 360bbb-3(b)(1), unless the authorization is terminated or revoked.  Performed at [facility], [address] [place], [place] [address]     Procedures/Studies: CT ANGIO HEAD NECK W WO CM Result Date: 12/31/2023 EXAM: CTA HEAD AND NECK WITH AND WITHOUT 12/30/2023 11:25:00 PM TECHNIQUE: CTA of the head and neck was performed with and without the administration of intravenous contrast. Multiplanar 2D and/or 3D reformatted images are provided for review. Automated exposure control, iterative reconstruction, and/or weight based adjustment of the mA/kV was utilized to reduce the radiation dose to as low as reasonably achievable. Stenosis of the internal carotid arteries measured using NASCET criteria. COMPARISON: Oc 4, 2025 CTA head/neck ple CLINICAL HISTORY: Neuro deficit, acute, stroke suspected. Chief complaints; Altered Mental Status; CT ANGIO HEAD NECK W WO CM; Neuro deficit, Acute, Stroke suspected; 75 [id number] GIVEN IN 18G IV LAC; Without Incident. FINDINGS: AORTIC ARCH AND ARCH VESSELS: Atherosclerosis. Great vessel origins are patent. CERVICAL CAROTID ARTERIES: Atherosclerosis and bilateral carotid bifurcations with 60 to 70% stenosis the left proximal ICA. No greater than 50% stenosis of the right ICA. Similar right ICA ulcerated atherosclerosis. CERVICAL VERTEBRAL ARTERIES: Similar sok i evere stenosis of the right vertebral origin. LUNGS AND MEDIASTINUM: Consolidative opacities in the right greater than left upper lobes. SOFT TISSUES: REDEMONSTRATED RIGHT PAROTID 12 MM LESION BONES: No acute abnormality. ANTERIOR CIRCULATION: No significant stenosis of the internal carotid arteries. No significant stenosis of the anterior cerebral arteries. Similar proximal right M2 MCA branch occlusion with distal reconstitution. No aneurysm.  POSTERIOR CIRCULATION: No significant stenosis of the posterior cerebral arteries. No significant stenosis of the basilar artery. No significant stenosis of the vertebral arteries. No aneurysm. OTHER: No dural venous sinus thrombosis on this non-dedicated study. IMPRESSION: 1. Similar proximal right M2 MCA branch occlusion with distal reconstitution. 2. Similar severe right vertebral artery stenosis. 3. Similar 60-70% proximal left ICA stenosis. 4. Consolidative opacities in the right greater than left upper lobes, suspicious for pneumonia . 5. Unchanged 12 mm right parotid lesion suspicous for primary parotid neoplasm. Electronically signed by: Gilmore Molt MD 12/31/2023 12:21 AM EDT RP Workstation: [id number]   CT CHEST ABDOMEN PELVIS W CONTRAST Result Date: 12/30/2023 CLINICAL DATA:  Evaluate for possible metastatic disease EXAM: CT CHEST, ABDOMEN, AND PELVIS WITH CONTRAST TECHNIQUE: Multidetector CT imaging of the chest, abdomen and pelvis was performed following the standard protocol during bolus administration of intravenous contrast. RADIATION DOSE REDUCTION: This exam was performed according to the departmental dose-optimization program which includes automated exposure control, adjustment of the mA and/or kV according to patient size and/or use of iterative reconstruction technique. CONTRAST:  75mL OMNIPAQUE  IOHEXOL  350 MG/ML SOLN COMPARISON:  CT from 12/22/2023 FINDINGS: CT CHEST FINDINGS Cardiovascular: Atherosclerotic calcifications of the thoracic aorta are noted. Dilatation of the ascending aorta to 4.6 cm is again noted. Normal tapering in the aortic arch is noted. Descending aorta is within normal  limits. No cardiac enlargement is seen. Mild coronary calcifications are noted. The pulmonary artery shows no large central pulmonary embolus although not timed for embolus evaluation. Mediastinum/Nodes: Thoracic inlet is within normal limits. Scattered small mediastinal lymph nodes are seen. No  sizable adenopathy is noted. The esophagus as visualized is within normal limits. Lungs/Pleura: Elevation of the right hemidiaphragm is seen. Stable compressive atelectasis in the right lower lobe is noted. Small left-sided pleural effusion is noted. Some nodular appearing airspace opacity is again seen in the left base stable from the prior exam. Follow-up as previously described. Musculoskeletal: Degenerative changes of the thoracic spine are noted. No acute rib abnormality is seen. CT ABDOMEN PELVIS FINDINGS Hepatobiliary: Fatty infiltration of the liver is noted. The gallbladder is within normal limits. Pancreas: Unremarkable. No pancreatic ductal dilatation or surrounding inflammatory changes. Spleen: Normal in size without focal abnormality. Adrenals/Urinary Tract: Adrenal glands are unremarkable. Kidneys demonstrate renal cystic change bilaterally stable from previous exams. No follow-up is recommended. No renal calculi or obstructive changes are seen. The bladder is well distended. Stomach/Bowel: No obstructive or inflammatory changes of the colon are seen. Minimal diverticular change is noted. The appendix is air-filled and within normal limits. Small bowel and stomach are within normal limits. Vascular/Lymphatic: Aortic atherosclerosis. No enlarged abdominal or pelvic lymph nodes. Reproductive: Prostate is unremarkable. Other: No abdominal wall hernia or abnormality. No abdominopelvic ascites. Musculoskeletal: Degenerative changes of lumbar spine are noted. IMPRESSION: CT of the chest: Dilatation of the ascending aorta to 4.6 cm. Patchy somewhat nodular airspace opacity in the left base stable from the prior exam of 8 days previous. Follow-up as previously described. Right basilar consolidation stable from the prior exam. CT of the abdomen and pelvis: Fatty liver. No acute abnormality noted. Electronically Signed   By: Oneil Devonshire M.D.   On: 12/30/2023 23:59   MR BRAIN WO CONTRAST Result Date:  12/30/2023 EXAM: MR Brain without Intravenous Contrast. CLINICAL HISTORY: Stroke, follow up. TECHNIQUE: Magnetic resonance images of the brain without intravenous contrast in multiple planes. CONTRAST: Without. COMPARISON: None provided. FINDINGS: BRAIN: Remtoe right MCA territory infarct. Bilateral cerebellar infarcts. Scattered T2/FLAIR hyperintensities in the white matter, compatible with chronic microvascular ischemic disease. New acute infarcts in the right frontal lobe. Evolving subacute infarct in the left posterior frontal lobe. Cerebral atrophy. No intracranial mass or hemorrhage. No midline shift or extra-axial fluid collection. The central arterial and venous flow voids are patent. VENTRICLES: No hydrocephalus. ORBITS: The orbits are normal. SINUSES AND MASTOIDS: The sinuses and mastoid air cells are clear. BONES: No focal osseous lesion. Other: Chronic 1.3 cm right parotid lesion. IMPRESSION: 1. New acute infarcts in the right frontal lobe. 2. Evolving subacute infarct in the left posterior frontal lobe. 3. Remote right MCA territory and cerebellar infarcts. Electronically signed by: Gilmore Molt MD 12/30/2023 01:26 PM EDT RP Workstation: [id number]   DG Chest 2 View Result Date: 12/30/2023 CLINICAL DATA:  ams EXAM: CHEST - 2 VIEW COMPARISON:  December 22, 2023 FINDINGS: Evaluation is limited by rotation. The cardiomediastinal silhouette is unchanged in contour.Elevation of the RIGHT hemidiaphragm. Possible trace RIGHT pleural effusion. No pneumothorax. RIGHT lower lobe heterogeneous opacities. Similar RIGHT middle lobe volume loss and opacity. Increased basilar lateral airspace opacity within the RIGHT lung. Visualized abdomen is unremarkable. Multilevel degenerative changes of the thoracic spine. IMPRESSION: 1. Increased RIGHT basilar airspace opacity. Differential considerations include atelectasis, aspiration or infection. Electronically Signed   By: Corean Salter M.D.   On: 12/30/2023  08:06  CT Head Wo Contrast Result Date: 12/30/2023 EXAM: CT HEAD WITHOUT CONTRAST 12/30/2023 07:30:05 AM TECHNIQUE: CT of the head was performed without the administration of intravenous contrast. Automated exposure control, iterative reconstruction, and/or weight based adjustment of the mA/kV was utilized to reduce the radiation dose to as low as reasonably achievable. COMPARISON: Brain MRI 12/23/2023 and Head CT 12/22/2023. CLINICAL HISTORY: 89 year old male. Mental status change, unknown cause. AMS. Small acute infarcts in the posterior left frontal lobe and the right cerebellum on MRI this month. FINDINGS: BRAIN AND VENTRICLES: No acute intracranial hemorrhage. No new cortically based infarct identified. Pronounced chronic encephalomalacia throughout the posterior right MCA territory is stable. Small infarcts in the posterior left frontal lobe and mid right cerebellum on MRI this month are recalled by CT. Chronic infarcts and encephalomalacia in those areas unchanged by CT. Chronic thalamic lacunar infarcts, chronic cerebral white matter hypodensity, and chronic right anterior external capsule lacunar infarcts are stable. Stable cerebral volume. No intracranial mass effect. No ventriculomegaly. No extra-axial collection. No midline shift. No suspicious intracranial vascular hyperdensity. Advanced calcified atherosclerosis. ORBITS: No acute orbital abnormality. SINUSES: Stable paranasal sinus, middle ear and mastoid aeration. SOFT TISSUES AND SKULL: No acute soft tissue abnormality. No acute scalp soft tissue finding. No skull fracture. IMPRESSION: 1. No acute intracranial abnormality. 2. Recent small acute infarcts on MRI remain occult by CT. Stable other advanced chronic ischemic disease. Electronically signed by: Helayne Hurst MD 12/30/2023 07:55 AM EDT RP Workstation: [id number]   ECHOCARDIOGRAM COMPLETE Result Date: 12/23/2023    ECHOCARDIOGRAM REPORT   Patient Name:   NILAN IDDINGS Date of Exam:  12/23/2023 Medical Rec #:  [id number]     Height:       72.0 in Accession #:    [id number]    Weight:       230.2 lb Date of Birth:  1933/03/30    BSA:          2.262 m Patient Age:    89 years      BP:           138/72 mmHg Patient Gender: M             HR:           82 bpm. Exam Location:  Inpatient Procedure: 2D Echo (Both Spectral and Color Flow Doppler were utilized during            procedure). Indications:    stroke  History:        Patient has prior history of Echocardiogram examinations, most                 recent 05/10/2022. Chronic kidney disease, Arrythmias:Atrial                 Fibrillation and PVC; Risk Factors:Dyslipidemia.  Sonographer:    Tinnie Barefoot RDCS Referring Phys: [id number] TIMOTHY S OPYD IMPRESSIONS  1. Left ventricular ejection fraction, by estimation, is 60 to 65%. The left ventricle has normal function. The left ventricle has no regional wall motion abnormalities. Left ventricular diastolic parameters were normal.  2. Right ventricular systolic function is normal. The right ventricular size is normal.  3. The mitral valve is normal in structure. Trivial mitral valve regurgitation. No evidence of mitral stenosis.  4. DVI 0.31. The aortic valve is normal in structure. Aortic valve regurgitation is mild. Moderate aortic valve stenosis. Aortic valve area, by VTI measures 1.08 cm. Aortic valve mean gradient measures 24.0 mmHg. Aortic valve  Vmax measures 3.17 m/s.  5. The inferior vena cava not well visualized. FINDINGS  Left Ventricle: Left ventricular ejection fraction, by estimation, is 60 to 65%. The left ventricle has normal function. The left ventricle has no regional wall motion abnormalities. The left ventricular internal cavity size was normal in size. There is  no left ventricular hypertrophy. Left ventricular diastolic parameters were normal. Right Ventricle: The right ventricular size is normal. No increase in right ventricular wall thickness. Right ventricular systolic  function is normal. Left Atrium: Left atrial size was normal in size. Right Atrium: Right atrial size was normal in size. Pericardium: There is no evidence of pericardial effusion. Mitral Valve: The mitral valve is normal in structure. Trivial mitral valve regurgitation. No evidence of mitral valve stenosis. Tricuspid Valve: The tricuspid valve is normal in structure. Tricuspid valve regurgitation is not demonstrated. No evidence of tricuspid stenosis. Aortic Valve: DVI 0.31. The aortic valve is normal in structure. Aortic valve regurgitation is mild. Aortic regurgitation PHT measures 278 msec. Moderate aortic stenosis is present. Aortic valve mean gradient measures 24.0 mmHg. Aortic valve peak gradient measures 40.2 mmHg. Aortic valve area, by VTI measures 1.08 cm. Pulmonic Valve: The pulmonic valve was not well visualized. Pulmonic valve regurgitation is not visualized. No evidence of pulmonic stenosis. Aorta: Aortic root could not be assessed. Venous: The inferior vena cava not well visualized. IAS/Shunts: No atrial level shunt detected by color flow Doppler.  LEFT VENTRICLE PLAX 2D LVIDd:         5.80 cm   Diastology LVIDs:         3.80 cm   LV e' medial:    5.55 cm/s LV PW:         1.20 cm   LV E/e' medial:  14.4 LV IVS:        1.40 cm   LV e' lateral:   11.50 cm/s LVOT diam:     2.10 cm   LV E/e' lateral: 6.9 LV SV:         70 LV SV Index:   31 LVOT Area:     3.46 cm  RIGHT VENTRICLE RV Basal diam:  3.20 cm RV S prime:     13.30 cm/s TAPSE (M-mode): 2.4 cm LEFT ATRIUM             Index        RIGHT ATRIUM           Index LA diam:        5.40 cm 2.39 cm/m   RA Area:     14.00 cm LA Vol (A2C):   65.4 ml 28.92 ml/m  RA Volume:   35.10 ml  15.52 ml/m LA Vol (A4C):   60.4 ml 26.71 ml/m LA Biplane Vol: 63.1 ml 27.90 ml/m  AORTIC VALVE AV Area (Vmax):    1.09 cm AV Area (Vmean):   1.03 cm AV Area (VTI):     1.08 cm AV Vmax:           317.00 cm/s AV Vmean:          228.000 cm/s AV VTI:            0.654 m AV  Peak Grad:      40.2 mmHg AV Mean Grad:      24.0 mmHg LVOT Vmax:         100.00 cm/s LVOT Vmean:        68.000 cm/s LVOT VTI:  0.203 m LVOT/AV VTI ratio: 0.31 AI PHT:            278 msec  AORTA Ao Root diam: 3.20 cm MR Peak grad: 144.5 mmHg MR Mean grad: 87.0 mmHg    SHUNTS MR Vmax:      601.00 cm/s  Systemic VTI:  0.20 m MR Vmean:     432.0 cm/s   Systemic Diam: 2.10 cm MV E velocity: 79.80 cm/s MV A velocity: 81.90 cm/s MV E/A ratio:  0.97 Franck Azobou Tonleu Electronically signed by Joelle Cedars Tonleu Signature Date/Time: 12/23/2023/12:59:02 PM    Final    MR BRAIN WO CONTRAST Result Date: 12/23/2023 EXAM: MRI BRAIN WITHOUT CONTRAST 12/23/2023 05:36:44 AM TECHNIQUE: Multiplanar multisequence MRI of the head/brain was performed without the administration of intravenous contrast. COMPARISON: MRI of the head dated 05/10/2022. CLINICAL HISTORY: Neuro deficit, acute, stroke suspected. FINDINGS: BRAIN AND VENTRICLES: Acute cortical infarct present within the cortex of the left precentral gyrus seen on image 86 of series 5. Acute lacunar infarct within the right cerebellar hemisphere seen on image 62. There are chronic encephalomalacia changes within the right parietal, temporal, and occipital lobes secondary to the infarct noted on the previous study. There are also chronic encephalomalacia changes within the frontal lobes bilaterally. There is moderate generalized cerebral volume loss and advanced periventricular and deep cerebral white matter disease. No intracranial hemorrhage. No mass. No midline shift. No hydrocephalus. The sella is unremarkable. Normal flow voids. ORBITS: No acute abnormality. SINUSES AND MASTOIDS: No acute abnormality. BONES AND SOFT TISSUES: Normal marrow signal. No acute soft tissue abnormality. IMPRESSION: 1. Acute cortical infarct in the left precentral gyrus and acute lacunar infarct in the right cerebellar hemisphere. 2. Chronic encephalomalacia in the right parietal,  temporal, and occipital lobes, and in the frontal lobes bilaterally. 3. Moderate generalized cerebral volume loss and advanced periventricular and deep cerebral white matter disease. Electronically signed by: Evalene Coho MD 12/23/2023 10:25 AM EDT RP Workstation: [id number]   CT CHEST W CONTRAST Result Date: 12/22/2023 CLINICAL DATA:  Provided history: Respiratory illness, nondiagnostic xray EXAM: CT CHEST WITH CONTRAST TECHNIQUE: Multidetector CT imaging of the chest was performed during intravenous contrast administration. RADIATION DOSE REDUCTION: This exam was performed according to the departmental dose-optimization program which includes automated exposure control, adjustment of the mA and/or kV according to patient size and/or use of iterative reconstruction technique. CONTRAST:  75mL OMNIPAQUE  IOHEXOL  350 MG/ML SOLN COMPARISON:  Radiograph earlier today FINDINGS: Cardiovascular: The heart is normal in size. No pericardial effusion. Coronary artery calcifications. Irregular plaque throughout the thoracic aorta. The ascending aorta is dilated at 4.5 cm. No dissection or acute aortic finding. No central pulmonary embolus on this non dedicated exam. Mediastinum/Nodes: Shotty mediastinal and hilar lymph nodes, all subcentimeter short axis. Decompressed esophagus. No thyroid  nodule. Lungs/Pleura: Elevated right hemidiaphragm. Adjacent airspace disease in the right lower and middle lobe likely compressive atelectasis. Moderate emphysema. Dependent ground-glass and consolidative opacity in the right upper lobe abutting the fissure and peripherally. Nodular areas of airspace disease in the left lower lobe, series 4, image 121. For example elongated nodular consolidation measures 4 x 1.3 cm, series 4, image 121. No pleural effusion. Upper Abdomen: Elevated right hemidiaphragm. Bilateral renal cysts need no further imaging follow-up. Excreted IV contrast in the renal collecting systems from prior head and neck  CTA. Musculoskeletal: Diffuse thoracic spondylosis. There are no acute or suspicious osseous abnormalities. IMPRESSION: 1. Dependent ground-glass and consolidative opacity in the right upper lobe abutting the fissure  and peripherally, suspicious for pneumonia. 2. Nodular areas of airspace disease in the left lower lobe, largest measuring 4 x 1.3 cm also suspicious for infectio; however neoplasm is not excluded. Recommend follow-up CT in 4-6 weeks after course of treatment. 3. Elevated right hemidiaphragm with adjacent airspace disease in the right lower and middle lobes, likely compressive atelectasis. 4. Ascending aortic dilatation at 4.5 cm. Recommend semi-annual imaging followup by CTA or MRA and referral to cardiothoracic surgery if not already obtained. This recommendation follows 2010 ACCF/AHA/AATS/ACR/ASA/SCA/SCAI/SIR/STS/SVM Guidelines for the Diagnosis and Management of Patients With Thoracic Aortic Disease. Circulation. 2010; 121: [id number]. Aortic aneurysm NOS (ICD10-I71.9) Aortic Atherosclerosis (ICD10-I70.0) and Emphysema (ICD10-J43.9). Electronically Signed   By: Andrea Gasman M.D.   On: 12/22/2023 22:03   CT ANGIO HEAD NECK W WO CM (CODE STROKE) Addendum Date: 12/22/2023 ADDENDUM REPORT: 12/22/2023 19:11 ADDENDUM: CTA head impression #1 called by telephone at the time of interpretation on 12/22/2023 at 7:08 pm to provider Dr. Jakie, who verbally acknowledged these results. The progressive focus of ulcerated plaque at the distal right common carotid artery was also discussed at this time. Electronically Signed   By: Rockey Childs D.O.   On: 12/22/2023 19:11   Result Date: 12/22/2023 CLINICAL DATA:  Provided history: Neuro deficit, acute, stroke suspected. EXAM: CT ANGIOGRAPHY HEAD AND NECK WITH AND WITHOUT CONTRAST TECHNIQUE: Multidetector CT imaging of the head and neck was performed using the standard protocol during bolus administration of intravenous contrast. Multiplanar CT image  reconstructions and MIPs were obtained to evaluate the vascular anatomy. Carotid stenosis measurements (when applicable) are obtained utilizing NASCET criteria, using the distal internal carotid diameter as the denominator. RADIATION DOSE REDUCTION: This exam was performed according to the departmental dose-optimization program which includes automated exposure control, adjustment of the mA and/or kV according to patient size and/or use of iterative reconstruction technique. CONTRAST:  75mL OMNIPAQUE  IOHEXOL  350 MG/ML SOLN COMPARISON:  Noncontrast head CT performed earlier today 12/22/2023. MRA head 05/10/2022. CT angiogram head/neck 05/10/2022. FINDINGS: CTA NECK FINDINGS Aortic arch: Standard aortic branching. Atherosclerotic plaque within the visualized aortic arch and proximal major branch vessels of the neck. Streak/beam hardening artifact arising from a dense contrast bolus partially obscures the left subclavian artery. Within this limitation, there is no appreciable hemodynamically significant innominate or proximal subclavian artery stenosis. Right carotid system: CCA and ICA patent within the neck. Atherosclerotic plaque. Muscle the, there is an sclerotic plaque at the carotid bifurcation and within the proximal ICA resulting in less than 50% stenosis. Progressive focus of ulcerated plaque anteriorly at the distal common carotid artery, now measuring 4 mm (series 5, image 230). Partially retropharyngeal course of the cervical ICA. Left carotid system: CCA and ICA patent within the neck. Atherosclerotic plaque. Most notably, progressive (mildly calcified) plaque within the proximal ICA results in an estimated 60-70% stenosis which has progressed (series 5, images 214 and 215). Vertebral arteries: Patent within the neck. Severe stenosis at the right vertebral artery origin, unchanged. Atherosclerotic plaque within the right vertebral artery at the V3/V4 junction with up to moderate stenosis. The left  vertebral artery is non dominant and developmentally diminutive, but patent throughout the neck. Atherosclerotic plaque at the left vertebral artery origin. Skeleton: The patient is edentulous. Spondylosis of the cervical and visible thoracic levels. No acute fracture or aggressive osseous lesion. Other neck: 12 mm ovoid nodule again demonstrated within the superficial lobe of the right parotid gland. This is suspicious for a primary parotid neoplasm. Upper chest: Incompletely imaged cystic  or cavitary lesion within the right lower lobe (measuring at least 4.6 cm (series 5, image 344). Mild dependent atelectasis within the bilateral upper lobes and right lower lobe at the imaged levels. Review of the MIP images confirms the above findings CTA HEAD FINDINGS Anterior circulation: The intracranial internal carotid arteries are patent. The right MCA proximal M2 branch which was occluded on the prior CTA of 05/10/2022 is occluded more proximally on today's study. Additionally, there is a new severe stenosis within this vessel immediately upstream. Additional atherosclerotic irregularity of more distal M2 right MCA vessels. The left middle cerebral artery M1 segment is patent. No left M2 proximal branch occlusion or high-grade proximal stenosis is identified. The anterior cerebral arteries are patent. No intracranial aneurysm is identified. Posterior circulation: The intracranial vertebral arteries are patent. Progressive atherosclerotic plaque within the right vertebral artery at the V3/V4 junction now with up to moderate stenosis. The basilar artery is patent. The posterior cerebral arteries are patent. Atherosclerotic irregularity of both vessels without high-grade proximal stenosis. Fetal origin right PCA. The left posterior communicating artery is diminutive or absent. Venous sinuses: Limited assessment for dural venous sinus thrombosis due to contrast timing. Anatomic variants: As described. Review of the MIP images  confirms the above findings Attempts are being made to reach the ordering provider at this time. IMPRESSION: CTA neck: 1. The common carotid and internal carotid arteries are patent within the neck. Atherosclerotic plaque bilaterally, as described. Progressive atherosclerotic plaque within the proximal left internal carotid artery now with an estimated 60-70% stenosis at this site. Progressive focus of ulcerated plaque anteriorly at the distal right common carotid artery. 2. The right vertebral artery is dominant and patent within the neck. Unchanged severe atherosclerotic narrowing at the right vertebral artery origin. Progressive atherosclerotic plaque within the right vertebral artery at the V3/V4 junction resulting in up to moderate stenosis. 3. The left vertebral artery is non-dominant and developmentally diminutive, but patent throughout the neck. 4. Aortic Atherosclerosis (ICD10-I70.0). 5. Partially imaged cystic or cavitary pulmonary lesion within the right lower lobe. A dedicated chest CT is recommended for further characterization. 6. Unchanged 12 mm right parotid gland nodule suspicious for a primary parotid neoplasm. ENT follow-up recommended. CTA head: 1. The right middle cerebral artery proximal M2 branch which was occluded on the prior CTA of 05/10/2022 is occluded more proximally on today's study. Additionally, there is a new severe stenosis within this vessel immediately upstream. 2. No proximal intracranial large vessel occlusion identified elsewhere. 3. Background intracranial atherosclerotic disease as described. Electronically Signed: By: Rockey Childs D.O. On: 12/22/2023 19:06   DG Chest Portable 1 View Result Date: 12/22/2023 CLINICAL DATA:  Shortness of breath EXAM: PORTABLE CHEST - 1 VIEW COMPARISON:  July 04, 2013 FINDINGS: Low lung volumes with bronchovascular crowding. Elevation of the right hemidiaphragm with streaky right basilar atelectasis. Biapical pleural thickening. No  pneumothorax or pleural effusion. Mild cardiomegaly.Cardiac loop recorder device.No acute fracture or destructive lesion. Multilevel thoracic osteophytosis. IMPRESSION: No acute cardiopulmonary abnormality. Electronically Signed   By: Rogelia Myers M.D.   On: 12/22/2023 18:49   CT HEAD CODE STROKE WO CONTRAST Result Date: 12/22/2023 CLINICAL DATA:  Code stroke. Neuro deficit, acute, stroke suspected. Dysarthria. Right facial droop. EXAM: CT HEAD WITHOUT CONTRAST TECHNIQUE: Contiguous axial images were obtained from the base of the skull through the vertex without intravenous contrast. RADIATION DOSE REDUCTION: This exam was performed according to the departmental dose-optimization program which includes automated exposure control, adjustment of the mA and/or  kV according to patient size and/or use of iterative reconstruction technique. COMPARISON:  Head CT 05/16/2022. FINDINGS: Brain: Generalized cerebral atrophy. Large chronic cortical/subcortical right MCA/PCA territory infarct, increased in extent as compared to the prior head CT of 05/16/2022. Moderate-sized cortical/subcortical infarct within the mid left frontal lobe (MCA territory), new from the prior CT but chronic in appearance. Redemonstrated chronic cortical/subcortical infarct within the left parietal lobe (MCA territory). Unchanged chronic lacunar infarct within/about the right basal ganglia. Unchanged chronic lacunar infarct within the left thalamus. An infarct within the right thalamus is new from the prior head CT, however, there is associated volume loss at this site and this appears chronic. Background patchy and ill-defined hypoattenuation within the cerebral white matter, nonspecific but compatible with chronic small vessel ischemic disease. Chronic infarcts within the bilateral cerebellar hemispheres, not appreciably changed. There is no acute intracranial hemorrhage. No acute demarcated cortical infarct is identified. No extra-axial fluid  collection. No evidence of an intracranial mass. No midline shift. Vascular: No hyperdense vessel.  Atherosclerotic calcifications. Skull: No calvarial fracture or aggressive osseous lesion. Sinuses/Orbits: No mass or acute finding within the imaged orbits. No significant paranasal sinus disease at the imaged levels. Other: Small left mastoid effusion. Associated chronic sclerotic changes within left mastoid air cells. ASPECTS [facility] Stroke Program Early CT Score) - Ganglionic level infarction (caudate, lentiform nuclei, internal capsule, insula, M1-M3 cortex): 7 - Supraganglionic infarction (M4-M6 cortex): 3 Total score (0-10 with 10 being normal): 10 No evidence of an acute intracranial abnormality. These results were communicated to Dr. Merrianne at 6:26 pmon 10/4/2025by text page via the [facility] messaging system. IMPRESSION: 1. No evidence of an acute intracranial abnormality. 2. Parenchymal atrophy, chronic small vessel ischemic disease and chronic infarcts, as described. 3. Small left mastoid effusion. Associated chronic sclerotic changes within left mastoid air cells. Electronically Signed   By: Rockey Childs D.O.   On: 12/22/2023 18:27     Time coordinating discharge: Over 30 minutes    Alm Apo, MD  [facility] 12/31/2023, 4:15 PM

## 2023-12-31 NOTE — Evaluation (Signed)
 Occupational Therapy Evaluation Patient Details Name: Edward Maynard MRN: [id number] DOB: [birth year] Today's Date: 12/31/2023   History of Present Illness   Pt is 89 yo presenting to [facility] on 10/12 due to AMS. Mri with findings of new R frontal lobe infarct along with evolving L posterior frontal lobe infarct per neurology. Chest x-ray with concerns for atelectasis, aspiration or infection per MD note. PMH: A-fib on Xarelto , HLD, right MCA stroke s/p thrombectomy with reocclusion and residual left-sided weakness     Clinical Impressions Edward Maynard was evaluated s/p the above admission list. He lives at [name] and has assist for all transfers with the stedy and ADLs at baseline. Upon evaluation the pt was limited by chronic L hemi, fear of falling and weakness. Overall he needed mod A to come to the EOB, CGA to stand several times in the stedy frame and was dependently transferred to the chair. Due to the deficits listed below the pt also needs up to total A for LB ADLs and min A for UB ADLs. Pt is likely at/near his functional baseline, pt agrees and pt's daughter present to confirm. Pt will benefit from continued acute OT services and HHOT at ALF.       If plan is discharge home, recommend the following:   A little help with walking and/or transfers;A little help with bathing/dressing/bathroom;Assistance with cooking/housework;Assist for transportation;Help with stairs or ramp for entrance;Supervision due to cognitive status     Functional Status Assessment   Patient has had a recent decline in their functional status and demonstrates the ability to make significant improvements in function in a reasonable and predictable amount of time.     Equipment Recommendations   None recommended by OT      Precautions/Restrictions   Precautions Precautions: Fall Precaution/Restrictions Comments: L hemi baseline Restrictions Weight Bearing Restrictions Per Provider Order: No      Mobility Bed Mobility Overal bed mobility: Needs Assistance Bed Mobility: Supine to Sit     Supine to sit: Mod assist     General bed mobility comments: mod A to brign hips to the EOB - pt states he comes out of the bed to the L at baseline    Transfers Overall transfer level: Needs assistance Equipment used: Ambulation equipment used Transfers: Bed to chair/wheelchair/BSC, Sit to/from Stand Sit to Stand: Supervision           General transfer comment: no assist needed for several STS with stedy, depedently moved from the bed to the chair with the stedy Transfer via Lift Equipment: Stedy    Balance Overall balance assessment: Needs assistance Sitting-balance support: No upper extremity supported, Feet supported Sitting balance-Leahy Scale: Fair Sitting balance - Comments: statically   Standing balance support: Bilateral upper extremity supported Standing balance-Leahy Scale: Poor                             ADL either performed or assessed with clinical judgement   ADL Overall ADL's : Needs assistance/impaired Eating/Feeding: Set up;Sitting   Grooming: Set up;Sitting   Upper Body Bathing: Minimal assistance;Sitting   Lower Body Bathing: Maximal assistance;Sit to/from stand   Upper Body Dressing : Minimal assistance;Sitting   Lower Body Dressing: Maximal assistance   Toilet Transfer: Total assistance Toilet Transfer Details (indicate cue type and reason): in stedy frame Toileting- Clothing Manipulation and Hygiene: Total assistance;Sit to/from stand       Functional mobility during ADLs: Total assistance;Contact  guard assist (CGA for STS, total for transfers in stedy frame) General ADL Comments: pt is likely at his baseline level of functioning     Vision Baseline Vision/History: 1 Wears glasses Vision Assessment?: No apparent visual deficits Additional Comments: pt declined change     Perception Perception: Not tested       Praxis  Praxis: Not tested       Pertinent Vitals/Pain Pain Assessment Pain Assessment: No/denies pain Faces Pain Scale: No hurt Pain Intervention(s): Monitored during session     Extremity/Trunk Assessment Upper Extremity Assessment Upper Extremity Assessment: LUE deficits/detail;RUE deficits/detail RUE Deficits / Details: overall WFL for basic tasks and transfers. LUE Deficits / Details: deficits from prior CVA, AROM to 90* on shoulder, decr coordination but can flex/ext elbow, wrist WNL but keeps in resting flexion, limited digit extension, pt has splint for intermittent daytime use LUE Coordination: decreased fine motor   Lower Extremity Assessment Lower Extremity Assessment: Defer to PT evaluation   Cervical / Trunk Assessment Cervical / Trunk Assessment: Kyphotic   Communication Communication Communication: No apparent difficulties Factors Affecting Communication: Reduced clarity of speech;Difficulty expressing self;Hearing impaired   Cognition Arousal: Alert Behavior During Therapy: WFL for tasks assessed/performed Cognition: Cognition impaired   Orientation impairments: Situation         OT - Cognition Comments: some frustration voiced with being in the ED for so long and potential discharge today. Pt clearly reported his PLOF, dtr present and confirmed                 Following commands: Intact       Cueing  General Comments   Cueing Techniques: Verbal cues  VSS on RA   Exercises     Shoulder Instructions      Home Living Family/patient expects to be discharged to:: Assisted living     Type of Home:  (ALF)                       Home Equipment: Wheelchair - manual;Shower seat;Other (comment) (stedy)   Additional Comments: staff can provide 24/7 support      Prior Functioning/Environment Prior Level of Function : Needs assist             Mobility Comments: Needs assistance using stedy to transfer to/from w/c. Uses legs to push  himself around in w/c short distances. Intermittently stands with RW with assist. Has not walked in > 1 year ADLs Comments: Can feed himself, but needs help to prepare the food by cutting it up as needed. Needs assistance for bathing, dressing, and toileting. Usually wash in stedy    OT Problem List: Decreased strength;Decreased range of motion;Decreased activity tolerance;Impaired balance (sitting and/or standing);Impaired vision/perception;Decreased safety awareness;Impaired UE functional use   OT Treatment/Interventions: Self-care/ADL training;Therapeutic exercise;Energy conservation;DME and/or AE instruction;Therapeutic activities;Patient/family education;Balance training      OT Goals(Current goals can be found in the care plan section)   Acute Rehab OT Goals Patient Stated Goal: to eat OT Goal Formulation: With patient Time For Goal Achievement: 01/14/24 Potential to Achieve Goals: Good ADL Goals Pt Will Perform Grooming: with modified independence;sitting Pt Will Perform Lower Body Dressing: with mod assist;sit to/from stand Pt Will Transfer to Toilet: stand pivot transfer;bedside commode   OT Frequency:  Min 2X/week    Co-evaluation              AM-PAC OT 6 Clicks Daily Activity     Outcome Measure Help from another person eating  meals?: A Little Help from another person taking care of personal grooming?: A Little Help from another person toileting, which includes using toliet, bedpan, or urinal?: Total Help from another person bathing (including washing, rinsing, drying)?: A Lot Help from another person to put on and taking off regular upper body clothing?: A Little Help from another person to put on and taking off regular lower body clothing?: A Lot 6 Click Score: 14   End of Session Equipment Utilized During Treatment: Other (comment) (stedy) Nurse Communication: Mobility status  Activity Tolerance: Patient tolerated treatment well Patient left: in  chair;with call bell/phone within reach;with family/visitor present  OT Visit Diagnosis: Unsteadiness on feet (R26.81);Other abnormalities of gait and mobility (R26.89);Muscle weakness (generalized) (M62.81)                Time: 1530-1550 OT Time Calculation (min): 20 min Charges:  OT General Charges $OT Visit: 1 Visit OT Evaluation $OT Eval Moderate Complexity: 1 Mod  Lucie Kendall, OTR/L Acute Rehabilitation Services Office [phone] Secure Chat Communication Preferred   Lucie JONETTA Kendall 12/31/2023, 4:10 PM

## 2023-12-31 NOTE — Progress Notes (Signed)
 Progress Note    Edward Maynard   FMW:[id number]  DOB: 1933-12-01  DOA: 12/30/2023     1 PCP: Sherre Rea, FNP  Initial CC: AMS  Hospital Course: Edward Maynard is a 89 y.o. male with medical history significant of hypertension, hyperlipidemia, history of CVA with LUE weakness residual deficits, CKD 2, atrial fibrillation on Eliquis , depression, and anxiety presented   Patient with recent hospitalization from 10/4-10/7 after presenting with difficulty speaking and right-sided facial droop found to have acute left precentral gyrus and acute lacunar infarct of the right cerebral hemisphere.  Previously had been switched from Xarelto  to Eliquis  after having stroke back in February 2024.  Neurology consulted and recommended switching to Pradaxa but not covered by insurance for which patient was continued on Eliquis .  He was also treated for multifocal pneumonia with treated with antibiotics and course of steroids which were completed prior to readmission  Patient expresses feeling tired since being discharged from the hospital, but denies having any significant cough or fever.  He was not on a modified diet to his knowledge and has not had any significant coughing or choking events with the eating.  Although he does report still having a mild cough.    Per review of EMS run sheet at the facility staff reported that his speech  maybemore slurred and felt off while getting him up to the chair.     In the ED patient was noted to be afebrile with blood pressures elevated up to 160/90, and all other vital signs maintained.  Labs significant for WBC 15, thanks potassium 3.4, BUN 19, creatinine 1.27, glucose 187, and alkaline phosphatase 145.  Chest x-ray noted increased right basilar airspace opacity concerning for atelectasis, aspiration, versus infection.  Patient had been ordered 1 L of lactated Ringer's, Rocephin, and azithromycin.   Assessment and Plan:   Acute CVA - recurrent CVA; previous  MRI brain on 12/23/2023 showed  Acute cortical infarct in the left precentral gyrus and acute lacunar infarct in the right cerebellar hemisphere. - Now, repeat MRI brain on 12/30/2023 shows new infarcts involving right frontal lobe and evolving subacute infarct of the left posterior frontal lobe.  Remote infarcts noted in the right MCA territory and cerebellum as previously mentioned - Unable to be switched from Eliquis  due to insurance issues - Neurology recommending to continue on Eliquis  and adding baby aspirin  indefinitely - Follow-up PT/OT evals.  Patient resides at SNF and mostly bedbound/wheelchair-bound - Continue Lipitor  Acute Encephalopathy - resolved  - Patient was exhibiting some altered mentation along with potential dysarthria - Admitted for stroke workup - As of 10/13 back to normal baseline  History of recent pneumonia Possible aspiration Patient had just completed antibiotics and steroid taper for multifocal pneumonia during his most recent hospitalization.  Repeat chest x-ray noted increased right basilar airspace disease given concern for aspiration versus atelectasis versus infection. -Nontoxic-appearing and afebrile -Some leukocytosis on admission which may also be reactive.  Procalcitonin negative -Discontinue antibiotics and monitor off -Evaluated by SLP.  Some delayed cough but no overt signs or symptoms of aspiration.  Continue dysphagia 3 diet   History of CVA with residual deficit Patient with prior history of stroke back in 2024 for which led to significant left-sided weakness which he has basically been bedbound.  At that time switch from Xarelto  to Eliquis .  Subsequently, had a new stroke 10/4 with right sided facial droop and dysarthria.  Discussed switching to Pradaxa but insurance would not cover  and therefore continued on Eliquis . - Residual left-sided weakness.  Right parotid lesion Previously recommended outpatient follow-up during last  hospitalization. - will go ahead and biopsy while admitted given difficulty to get to outpt evaluation for this, especially with recurrent CVA and wanting to rule out malignancy  - can have results followed up outpatient after biopsy    Hypokalemia -Replete as needed   PAF Patient appears to be in a sinus rhythm at this time.   - Continue amiodarone, Cardizem , and Eliquis    Hyperlipidemia - Continue atorvastatin     CKD stage IIIa Stable.  On admission creatinine noted to be 1.27 with BUN 19.  Baseline Cr 1.1-1.2   - Continue to monitor   Prediabetes On admission glucose elevated up to 197.  Last Hemoglobin A1c 5.6.  Depression/anxiety - Continue Prozac, Remeron , trazodone    Ascending aortic dilatation at 4.5 cm - Recommend semi-annual imaging followup by CTA or MRA   Interval History:  Resting comfortably in bed when seen.  Son present bedside.  Stroke workup complete and awaiting PT/OT evaluations. Obtaining biopsy of right parotid gland as well given recurrent stroke.   Old records reviewed in assessment of this patient  Antimicrobials:   DVT prophylaxis:   apixaban  (ELIQUIS ) tablet 5 mg   Code Status:   Code Status: Limited: Do not attempt resuscitation (DNR) -DNR-LIMITED -Do Not Intubate/DNI   Mobility Assessment (Last 72 Hours)     Mobility Assessment     Row Name 12/31/23 12:09:09           Does the patient have exclusion criteria? No - Perform mobility assessment       What is the highest level of mobility based on the mobility assessment? Level 1 (Bedfast) - Unable to balance while sitting on edge of bed       Is the above level different from baseline mobility prior to current illness? Yes - Recommend PT order          Barriers to discharge: none Disposition Plan:  SNF HH orders placed: n/a Status is: Inpt  Objective: Blood pressure 122/73, pulse 83, temperature 98.2 F (36.8 C), temperature source Oral, resp. rate (!) 22, height 6' (1.829 m),  weight 104.4 kg, SpO2 96%.  Examination:  Physical Exam Constitutional:      General: He is not in acute distress.    Appearance: Normal appearance.  HENT:     Head: Normocephalic and atraumatic.     Mouth/Throat:     Mouth: Mucous membranes are moist.  Eyes:     Extraocular Movements: Extraocular movements intact.  Cardiovascular:     Rate and Rhythm: Normal rate and regular rhythm.  Pulmonary:     Effort: Pulmonary effort is normal. No respiratory distress.     Breath sounds: Normal breath sounds. No wheezing.  Abdominal:     General: Bowel sounds are normal. There is no distension.     Palpations: Abdomen is soft.     Tenderness: There is no abdominal tenderness.  Musculoskeletal:        General: Normal range of motion.     Cervical back: Normal range of motion and neck supple.  Skin:    General: Skin is warm and dry.  Neurological:     Mental Status: He is alert.     Comments: Chronic left upper extremity and lower extremity residual weakness.  Mild left hand contracture.  No further dysarthria appreciated.  Right sided strength appears normal and intact 5/5  Psychiatric:  Mood and Affect: Mood normal.        Behavior: Behavior normal.      Consultants:  Neurology  Procedures:    Data Reviewed: Results for orders placed or performed during the hospital encounter of 12/30/23 (from the past 24 hours)  CBC     Status: Abnormal   Collection Time: 12/31/23  4:40 AM  Result Value Ref Range   WBC 11.6 (H) 4.0 - 10.5 K/uL   RBC 4.84 4.22 - 5.81 MIL/uL   Hemoglobin 15.4 13.0 - 17.0 g/dL   HCT 53.7 60.9 - 47.9 %   MCV 95.5 80.0 - 100.0 fL   MCH 31.8 26.0 - 34.0 pg   MCHC 33.3 30.0 - 36.0 g/dL   RDW 86.2 88.4 - 84.4 %   Platelets 171 150 - 400 K/uL   nRBC 0.0 0.0 - 0.2 %  Comprehensive metabolic panel     Status: Abnormal   Collection Time: 12/31/23  4:40 AM  Result Value Ref Range   Sodium 136 135 - 145 mmol/L   Potassium 3.6 3.5 - 5.1 mmol/L   Chloride  101 98 - 111 mmol/L   CO2 25 22 - 32 mmol/L   Glucose, Bld 113 (H) 70 - 99 mg/dL   BUN 16 8 - 23 mg/dL   Creatinine, Ser 8.72 (H) 0.61 - 1.24 mg/dL   Calcium  8.4 (L) 8.9 - 10.3 mg/dL   Total Protein 6.4 (L) 6.5 - 8.1 g/dL   Albumin 3.1 (L) 3.5 - 5.0 g/dL   AST 20 15 - 41 U/L   ALT 48 (H) 0 - 44 U/L   Alkaline Phosphatase 132 (H) 38 - 126 U/L   Total Bilirubin 1.0 0.0 - 1.2 mg/dL   GFR, Estimated 55 (L) >60 mL/min   Anion gap 10 5 - 15  Procalcitonin     Status: None   Collection Time: 12/31/23  4:40 AM  Result Value Ref Range   Procalcitonin <0.10 ng/mL    I have reviewed pertinent nursing notes, vitals, labs, and images as necessary. I have ordered labwork to follow up on as indicated.  I have reviewed the last notes from staff over past 24 hours. I have discussed patient's care plan and test results with nursing staff, CM/SW, and other staff as appropriate.  Time spent: Greater than 50% of the 55 minute visit was spent in counseling/coordination of care for the patient as laid out in the A&P.   LOS: 1 day   Alm Apo, MD [facility] 12/31/2023, 1:49 PM

## 2023-12-31 NOTE — TOC Transition Note (Signed)
 Transition of Care [facility]) - Discharge Note   Patient Details  Name: DAERON CARRENO MRN: [id number] Date of Birth: [birth year]  [facility] CM/SW Contact:  Luann SHAUNNA Cumming, LCSW Phone Number: 12/31/2023, 4:25 PM   Clinical Narrative:     Pt to discharge to [facility]. Family is aware. Discharge Summary, FL2, HH orders, and face to face sent to facility. RN to call report prior to discharge [phone] ). DC packet on chart. Ambulance transport requested for patient.   CSW will sign off for now as social work intervention is no longer needed. Please consult us  again if new needs arise.   Final next level of care: Assisted Living Barriers to Discharge: No Barriers Identified   Patient Goals and CMS Choice            Discharge Placement                       Discharge Plan and Services Additional resources added to the After Visit Summary for                                       Social Drivers of Health (SDOH) Interventions SDOH Screenings   Food Insecurity: No Food Insecurity (12/22/2023)  Housing: Low Risk  (12/22/2023)  Transportation Needs: No Transportation Needs (12/22/2023)  Utilities: Not At Risk (12/22/2023)  Depression (PHQ2-9): Low Risk  (11/10/2022)  Social Connections: Moderately Integrated (12/22/2023)  Tobacco Use: Medium Risk (12/30/2023)     Readmission Risk Interventions     No data to display

## 2023-12-31 NOTE — ED Notes (Signed)
 Called.

## 2023-12-31 NOTE — Evaluation (Addendum)
 Physical Therapy Evaluation Patient Details Name: Edward Maynard MRN: [id number] DOB: 10-03-1933 Today's Date: 12/31/2023  History of Present Illness  Pt is 89 yo presenting to [facility] on 10/12 due to AMS. Mri with findings of new R frontal lobe infarct along with evolving L posterior frontal lobe infarct per neurology. Chest x-ray with concerns for atelectasis, aspiration or infection per MD note. PMH: A-fib on Xarelto , HLD, right MCA stroke s/p thrombectomy with reocclusion and residual left-sided weakness  Clinical Impression  Pt is currently Max A for bed mobility, Max A for sit to stand at EOB. Pt with high fear of falling and posterior lean in standing. Pt gets slightly agitated due to fear after mobility. Due to pt current functional status, home set up and available assistance at home recommending skilled physical therapy services 3x/week in order to address strength, balance and functional mobility to decrease risk for falls, injury and re-hospitalization.            If plan is discharge home, recommend the following: A little help with walking and/or transfers;Assistance with cooking/housework;Assist for transportation;Help with stairs or ramp for entrance     Equipment Recommendations None recommended by PT     Functional Status Assessment Patient has had a recent decline in their functional status and demonstrates the ability to make significant improvements in function in a reasonable and predictable amount of time.     Precautions / Restrictions Precautions Precautions: Fall Precaution/Restrictions Comments: L hemi baseline Restrictions Weight Bearing Restrictions Per Provider Order: No      Mobility  Bed Mobility Overal bed mobility: Needs Assistance Bed Mobility: Rolling Rolling: Max assist   Supine to sit: Max assist Sit to supine: Max assist   General bed mobility comments: Max A for rolling R/L and Max A for trunk to mid line and back to bed. Pt with high fear of  falling.    Transfers Overall transfer level: Needs assistance Equipment used: None Transfers: Sit to/from Stand Sit to Stand: Max assist           General transfer comment: Max A for sit to stand from EOB, posterior lean, pt with high fear of falling, face to face standing with gait belt    Ambulation/Gait     General Gait Details: Pt is non-ambulatory for greater than one year   Modified Rankin (Stroke Patients Only) Modified Rankin (Stroke Patients Only) Pre-Morbid Rankin Score: Severe disability Modified Rankin: Severe disability     Balance Overall balance assessment: Needs assistance Sitting-balance support: No upper extremity supported, Feet supported Sitting balance-Leahy Scale: Poor Sitting balance - Comments: static sitting EOB with supervision for safety Postural control: Posterior lean Standing balance support: Bilateral upper extremity supported Standing balance-Leahy Scale: Poor Standing balance comment: Reliant on therapist and UE support.       Pertinent Vitals/Pain Pain Assessment Pain Assessment: No/denies pain Pain Intervention(s): Monitored during session    Home Living Family/patient expects to be discharged to:: Assisted living ([name])   Home Equipment: Wheelchair - Manufacturing systems engineer;Other (comment) Additional Comments: staff can provide 24/7 care    Prior Function Prior Level of Function : Needs assist             Mobility Comments: Needs assistance using stedy to transfer to/from w/c. Uses legs to push himself around in w/c short distances. Intermittently stands with RW with assist. Has not walked in > 1 year ADLs Comments: Can feed himself, but needs help to prepare the food by cutting  it up as needed. Needs assistance for bathing, dressing, and toileting. Usually wash in stedy     Extremity/Trunk Assessment   Upper Extremity Assessment Upper Extremity Assessment: Generalized weakness    Lower Extremity  Assessment Lower Extremity Assessment: Generalized weakness    Cervical / Trunk Assessment Cervical / Trunk Assessment: Kyphotic  Communication   Communication Communication: Impaired Factors Affecting Communication: Reduced clarity of speech;Difficulty expressing self;Hearing impaired    Cognition Arousal: Alert Behavior During Therapy: WFL for tasks assessed/performed   PT - Cognitive impairments: Attention, Initiation, Sequencing, Problem solving, Safety/Judgement       Following commands: Impaired Following commands impaired: Follows one step commands inconsistently, Follows one step commands with increased time     Cueing Cueing Techniques: Verbal cues     General Comments General comments (skin integrity, edema, etc.): HR up to 120's during standing. O2 sats WNL on room air        Assessment/Plan    PT Assessment Patient needs continued PT services  PT Problem List Decreased strength;Decreased activity tolerance;Decreased balance;Decreased mobility;Decreased cognition;Impaired sensation       PT Treatment Interventions DME instruction;Gait training;Functional mobility training;Therapeutic activities;Therapeutic exercise;Balance training;Neuromuscular re-education;Cognitive remediation;Wheelchair mobility training;Patient/family education    PT Goals (Current goals can be found in the Care Plan section)  Acute Rehab PT Goals Patient Stated Goal: to be able to half way take care of himself PT Goal Formulation: With patient Time For Goal Achievement: 01/14/24 Potential to Achieve Goals: Good    Frequency Min 2X/week        AM-PAC PT 6 Clicks Mobility  Outcome Measure Help needed turning from your back to your side while in a flat bed without using bedrails?: A Lot Help needed moving from lying on your back to sitting on the side of a flat bed without using bedrails?: A Lot Help needed moving to and from a bed to a chair (including a wheelchair)?: A  Lot Help needed standing up from a chair using your arms (e.g., wheelchair or bedside chair)?: A Lot Help needed to walk in hospital room?: Total Help needed climbing 3-5 steps with a railing? : Total 6 Click Score: 10    End of Session Equipment Utilized During Treatment: Gait belt Activity Tolerance: Patient tolerated treatment well Patient left: in bed;with call bell/phone within reach Nurse Communication: Mobility status PT Visit Diagnosis: Unsteadiness on feet (R26.81);Other abnormalities of gait and mobility (R26.89);Muscle weakness (generalized) (M62.81);Difficulty in walking, not elsewhere classified (R26.2)    Time: 8648-8580 PT Time Calculation (min) (ACUTE ONLY): 28 min   Charges:   PT Evaluation $PT Eval Low Complexity: 1 Low PT Treatments $Therapeutic Activity: 8-22 mins PT General Charges $$ ACUTE PT VISIT: 1 Visit         Dorothyann Maier, DPT, CLT  Acute Rehabilitation Services Office: [phone] (Secure chat preferred)   Dorothyann VEAR Maier 12/31/2023, 3:08 PM

## 2023-12-31 NOTE — TOC Progression Note (Addendum)
 Transition of Care [facility]) - Progression Note    Patient Details  Name: Edward Maynard MRN: [id number] Date of Birth: [birth year]  Transition of Care [facility]) CM/SW Contact  Luann SHAUNNA [place], [place] Phone Number: 12/31/2023, 2:31 PM  Clinical Narrative:     Left voicemail with Therisa Berg at [facility] requesting return call regarding DC today.   1500: CSW called [name] again; Diplomatic Services operational officer provided fax number of [phone]. CSW faxed DC summary fl2 and Hh orders.   1530: CSW called facility again; this time transferred med tech and notified her of DC. She states that CSW will need to email documents to [email or web address]. CSW sent needed info to email.   Social Drivers of Health (SDOH) Interventions SDOH Screenings   Food Insecurity: No Food Insecurity (12/22/2023)  Housing: Low Risk  (12/22/2023)  Transportation Needs: No Transportation Needs (12/22/2023)  Utilities: Not At Risk (12/22/2023)  Depression (PHQ2-9): Low Risk  (11/10/2022)  Social Connections: Moderately Integrated (12/22/2023)  Tobacco Use: Medium Risk (12/30/2023)    Readmission Risk Interventions     No data to display

## 2023-12-31 NOTE — Care Management CC44 (Signed)
 Condition Code 44 Documentation Completed  Patient Details  Name: GEARL BARATTA MRN: [id number] Date of Birth: 15-Jul-1933   Condition Code 44 given:  Yes Patient signature on Condition Code 44 notice:  Yes Documentation of 2 MD's agreement:  Yes Code 44 added to claim:  Yes    Andrez JULIANNA George, RN 12/31/2023, 4:07 PM

## 2023-12-31 NOTE — Progress Notes (Signed)
 Patient incontinent of a urine episode while waiting for transport. Daughter at bedside very upset about having to wait for [facility] transport whose ETA was supposed to be around 530-7pm. [facility] called and confirmed pick up - they confirmed he is on the list and he will be picked up in about 1-1.5hrs. Daughter felt like this was unacceptable. Daughter requested to transport patient by private vehicle at this time. Discharge paper work handed to daughter - including DNR sheet.  Charge Nurse Doran notified of situation.

## 2023-12-31 NOTE — Consult Note (Signed)
 Chief Complaint: Patient was seen in consultation today for right parotid glad lesion, with consideration for biopsy.  Referring Provider(s): Dr. Alm Apo, MD   Supervising Physician: Philip Cornet  Patient Status: [facility] - ED  Current Code Status  Limited: Do not attempt resuscitation (DNR) -DNR-LIMITED -Do Not Intubate/DNI - Set by Claudene Maximino LABOR, MD at 12/30/2023 1009 (View report)  Question Answer  If pulseless and not breathing No CPR or chest compressions.  In Pre-Arrest Conditions (Patient Is Breathing and Has A Pulse) Do not intubate. Provide all appropriate non-invasive medical interventions. Avoid ICU transfer unless indicated or required.  Consent: Discussion documented in EHR or advanced directives reviewed     Patient currently has DNR order in place. Discussion with the patient and family regarding wishes.    History of Present Illness: Edward Maynard is a 89 y.o. male  with PMHx notable for right parotid gland lesion, recent CVA with residual left-sided deficits, HLD, HTN, CKD 2, A-fib (on Eliquis ), depression and anxiety.  Patient was recently inpatient for CVA workup, and discharged with recommendation for right parotid gland biopsy as outpatient.  However, patient was seen in ED on 10/12 with new concern for right facial droop, and found to have acute left precentral gyrus and acute lacunar infarct of the right cerebral hemisphere.  Patient was previously on Xarelto , switched to Eliquis .  Currently being worked up by neurology.  However, care team has requested to pursue right parotid gland lesion biopsy. Interventional Radiology was requested for same. The request was reviewed and approved by Dr. Philip. Patient is tentatively scheduled for right parotid glad biopsy in IR today.   Patient is alert and laying in bed, calm.  Son-in-law is at the bedside. Patient is currently without any significant complaints.  Patient denies any fevers, headache, chest pain, SOB,  cough, abdominal pain, nausea, vomiting or bleeding.    Past Medical History:  Diagnosis Date   High cholesterol    05/2013; no defecits   Hyperlipemia    Left arm weakness    Left hand weakness    Personal history of colonic polyp - adenoma  10/23/2013   Stroke (HCC) 05/2013   MRI on 07/02/13 = Multifocal acute & subacute infarction involving right frontal MCA/ACA & left parietal MCA/PCA watershed areas    Past Surgical History:  Procedure Laterality Date   IR CT HEAD LTD  05/10/2022   IR PERCUTANEOUS ART THROMBECTOMY/INFUSION INTRACRANIAL INC DIAG ANGIO  05/10/2022   LOOP RECORDER IMPLANT N/A 10/31/2013   Procedure: LOOP RECORDER IMPLANT;  Surgeon: Lynwood JONETTA Rakers, MD;  Location: [facility] CATH LAB;  Service: Cardiovascular;  Laterality: N/A;   RADIOLOGY WITH ANESTHESIA N/A 05/10/2022   Procedure: IR WITH ANESTHESIA;  Surgeon: Dolphus Carrion, MD;  Location: [facility] OR;  Service: Radiology;  Laterality: N/A;   SHOULDER SURGERY Bilateral 2003, 1995    Allergies: Patient has no known allergies.  Medications: Prior to Admission medications   Medication Sig Start Date End Date Taking? Authorizing Provider  acetaminophen  (TYLENOL ) 325 MG tablet Take 2 tablets (650 mg total) by mouth every 4 (four) hours as needed for mild pain (or temp > 37.5 C (99.5 F)). 06/07/22  Yes Angiulli, Toribio PARAS, PA-C  amiodarone (PACERONE) 200 MG tablet Take 100 mg by mouth daily. 12/25/23  Yes [provider]  apixaban  (ELIQUIS ) 5 MG TABS tablet Take 1 tablet (5 mg total) by mouth 2 (two) times daily. 05/18/22  Yes Waddell Karna LABOR, NP  atorvastatin  (  LIPITOR) 80 MG tablet Take 1 tablet (80 mg total) by mouth daily. 12/26/23  Yes Rojelio Nest, DO  Cholecalciferol 25 MCG (1000 UT) capsule Take 1,000 Units by mouth daily.   Yes [provider]  diltiazem  (CARDIZEM  CD) 120 MG 24 hr capsule Take 1 capsule (120 mg total) by mouth daily. 12/26/23  Yes Rojelio Nest, DO  docusate sodium  (COLACE) 100 MG capsule Take  1 capsule (100 mg total) by mouth daily as needed for mild constipation. 11/03/22  Yes Wert, Tawni, NP  Emollient (AQUAPHOR OINTMENT BODY EX) Apply 1 application  topically daily. To bilateral extremities   Yes [provider]  FLUoxetine (PROZAC) 10 MG capsule Take 10 mg by mouth daily.   Yes [provider]  folic acid  (FOLVITE ) 400 MCG tablet Take 400 mcg by mouth daily.   Yes [provider]  hydrocortisone 1 % ointment Apply 1 Application topically 3 (three) times daily as needed for itching.   Yes [provider]  hydrOXYzine  (ATARAX ) 25 MG tablet Take 25 mg by mouth every 6 (six) hours as needed for anxiety or itching.   Yes [provider]  mirtazapine  (REMERON ) 7.5 MG tablet Take 7.5 mg by mouth at bedtime.   Yes [provider]  traZODone  (DESYREL ) 50 MG tablet Take 50 mg by mouth at bedtime.   Yes [provider]  triamcinolone  cream (KENALOG ) 0.1 % Apply 1 Application topically 2 (two) times daily as needed (rash). Affected areas of leg   Yes [provider]     Family History  Problem Relation Age of Onset   Hypertension Mother    Hypertension Father    Stroke Father    COPD Brother    Stroke Brother    Colon cancer Neg Hx    Heart attack Neg Hx     Social History   Socioeconomic History   Marital status: Married    Spouse name: lea   Number of children: 2   Years of education: college   Highest education level: Not on file  Occupational History   Occupation: retired  Tobacco Use   Smoking status: Former    Current packs/day: 0.00    Types: Cigarettes    Quit date: 03/20/1970    Years since quitting: 53.8   Smokeless tobacco: Never  Vaping Use   Vaping status: Never Used  Substance and Sexual Activity   Alcohol use: No   Drug use: No   Sexual activity: Yes  Other Topics Concern   Not on file  Social History Narrative   Patient right handed   Patient lives with his wife   Patient  coffee daily   Enjoys playing golf   Social Drivers of Corporate investment banker Strain: Not on file  Food Insecurity: No Food Insecurity (12/22/2023)   Hunger Vital Sign    Worried About Running Out of Food in the Last Year: Never true    Ran Out of Food in the Last Year: Never true  Transportation Needs: No Transportation Needs (12/22/2023)   PRAPARE - Administrator, Civil Service (Medical): No    Lack of Transportation (Non-Medical): No  Physical Activity: Not on file  Stress: Not on file  Social Connections: Moderately Integrated (12/22/2023)   Social Connection and Isolation Panel    Frequency of Communication with Friends and Family: Three times a week    Frequency of Social Gatherings with Friends and Family: Three times a week  Attends Religious Services: 1 to 4 times per year    Active Member of Clubs or Organizations: No    Attends Banker Meetings: Never    Marital Status: Married     Review of Systems: A 12 point ROS discussed and pertinent positives are indicated in the HPI above.  All other systems are negative.  Vital Signs: BP (!) 144/70   Pulse (!) 129   Temp 98.5 F (36.9 C) (Oral)   Resp (!) 24   Ht 6' (1.829 m)   Wt 230 lb 2.6 oz (104.4 kg)   SpO2 95%   BMI 31.22 kg/m   Advance Care Plan: The advanced care place/surrogate decision maker was discussed at the time of visit and the patient did not wish to discuss or was not able to name a surrogate decision maker or provide an advance care plan.  Physical Exam Constitutional:      Appearance: Normal appearance.  Cardiovascular:     Rate and Rhythm: Normal rate.     Pulses: Normal pulses.     Heart sounds: Normal heart sounds.  Pulmonary:     Effort: Pulmonary effort is normal.  Musculoskeletal:        General: Normal range of motion.     Cervical back: Normal range of motion.  Skin:    General: Skin is warm and dry.  Neurological:     Mental Status: He is alert and  oriented to person, place, and time.  Psychiatric:        Mood and Affect: Mood normal.        Behavior: Behavior normal.     Imaging: CT ANGIO HEAD NECK W WO CM Result Date: 12/31/2023 EXAM: CTA HEAD AND NECK WITH AND WITHOUT 12/30/2023 11:25:00 PM TECHNIQUE: CTA of the head and neck was performed with and without the administration of intravenous contrast. Multiplanar 2D and/or 3D reformatted images are provided for review. Automated exposure control, iterative reconstruction, and/or weight based adjustment of the mA/kV was utilized to reduce the radiation dose to as low as reasonably achievable. Stenosis of the internal carotid arteries measured using NASCET criteria. COMPARISON: Oc 4, 2025 CTA head/neck ple CLINICAL HISTORY: Neuro deficit, acute, stroke suspected. Chief complaints; Altered Mental Status; CT ANGIO HEAD NECK W WO CM; Neuro deficit, Acute, Stroke suspected; 75 [id number] GIVEN IN 18G IV LAC; Without Incident. FINDINGS: AORTIC ARCH AND ARCH VESSELS: Atherosclerosis. Great vessel origins are patent. CERVICAL CAROTID ARTERIES: Atherosclerosis and bilateral carotid bifurcations with 60 to 70% stenosis the left proximal ICA. No greater than 50% stenosis of the right ICA. Similar right ICA ulcerated atherosclerosis. CERVICAL VERTEBRAL ARTERIES: Similar sok i evere stenosis of the right vertebral origin. LUNGS AND MEDIASTINUM: Consolidative opacities in the right greater than left upper lobes. SOFT TISSUES: REDEMONSTRATED RIGHT PAROTID 12 MM LESION BONES: No acute abnormality. ANTERIOR CIRCULATION: No significant stenosis of the internal carotid arteries. No significant stenosis of the anterior cerebral arteries. Similar proximal right M2 MCA branch occlusion with distal reconstitution. No aneurysm. POSTERIOR CIRCULATION: No significant stenosis of the posterior cerebral arteries. No significant stenosis of the basilar artery. No significant stenosis of the vertebral arteries. No aneurysm. OTHER:  No dural venous sinus thrombosis on this non-dedicated study. IMPRESSION: 1. Similar proximal right M2 MCA branch occlusion with distal reconstitution. 2. Similar severe right vertebral artery stenosis. 3. Similar 60-70% proximal left ICA stenosis. 4. Consolidative opacities in the right greater than left upper lobes, suspicious for pneumonia . 5. Unchanged 12 mm  right parotid lesion suspicous for primary parotid neoplasm. Electronically signed by: Gilmore Molt MD 12/31/2023 12:21 AM EDT RP Workstation: [id number]   CT CHEST ABDOMEN PELVIS W CONTRAST Result Date: 12/30/2023 CLINICAL DATA:  Evaluate for possible metastatic disease EXAM: CT CHEST, ABDOMEN, AND PELVIS WITH CONTRAST TECHNIQUE: Multidetector CT imaging of the chest, abdomen and pelvis was performed following the standard protocol during bolus administration of intravenous contrast. RADIATION DOSE REDUCTION: This exam was performed according to the departmental dose-optimization program which includes automated exposure control, adjustment of the mA and/or kV according to patient size and/or use of iterative reconstruction technique. CONTRAST:  75mL OMNIPAQUE  IOHEXOL  350 MG/ML SOLN COMPARISON:  CT from 12/22/2023 FINDINGS: CT CHEST FINDINGS Cardiovascular: Atherosclerotic calcifications of the thoracic aorta are noted. Dilatation of the ascending aorta to 4.6 cm is again noted. Normal tapering in the aortic arch is noted. Descending aorta is within normal limits. No cardiac enlargement is seen. Mild coronary calcifications are noted. The pulmonary artery shows no large central pulmonary embolus although not timed for embolus evaluation. Mediastinum/Nodes: Thoracic inlet is within normal limits. Scattered small mediastinal lymph nodes are seen. No sizable adenopathy is noted. The esophagus as visualized is within normal limits. Lungs/Pleura: Elevation of the right hemidiaphragm is seen. Stable compressive atelectasis in the right lower lobe is  noted. Small left-sided pleural effusion is noted. Some nodular appearing airspace opacity is again seen in the left base stable from the prior exam. Follow-up as previously described. Musculoskeletal: Degenerative changes of the thoracic spine are noted. No acute rib abnormality is seen. CT ABDOMEN PELVIS FINDINGS Hepatobiliary: Fatty infiltration of the liver is noted. The gallbladder is within normal limits. Pancreas: Unremarkable. No pancreatic ductal dilatation or surrounding inflammatory changes. Spleen: Normal in size without focal abnormality. Adrenals/Urinary Tract: Adrenal glands are unremarkable. Kidneys demonstrate renal cystic change bilaterally stable from previous exams. No follow-up is recommended. No renal calculi or obstructive changes are seen. The bladder is well distended. Stomach/Bowel: No obstructive or inflammatory changes of the colon are seen. Minimal diverticular change is noted. The appendix is air-filled and within normal limits. Small bowel and stomach are within normal limits. Vascular/Lymphatic: Aortic atherosclerosis. No enlarged abdominal or pelvic lymph nodes. Reproductive: Prostate is unremarkable. Other: No abdominal wall hernia or abnormality. No abdominopelvic ascites. Musculoskeletal: Degenerative changes of lumbar spine are noted. IMPRESSION: CT of the chest: Dilatation of the ascending aorta to 4.6 cm. Patchy somewhat nodular airspace opacity in the left base stable from the prior exam of 8 days previous. Follow-up as previously described. Right basilar consolidation stable from the prior exam. CT of the abdomen and pelvis: Fatty liver. No acute abnormality noted. Electronically Signed   By: Oneil Devonshire M.D.   On: 12/30/2023 23:59   MR BRAIN WO CONTRAST Result Date: 12/30/2023 EXAM: MR Brain without Intravenous Contrast. CLINICAL HISTORY: Stroke, follow up. TECHNIQUE: Magnetic resonance images of the brain without intravenous contrast in multiple planes. CONTRAST:  Without. COMPARISON: None provided. FINDINGS: BRAIN: Remtoe right MCA territory infarct. Bilateral cerebellar infarcts. Scattered T2/FLAIR hyperintensities in the white matter, compatible with chronic microvascular ischemic disease. New acute infarcts in the right frontal lobe. Evolving subacute infarct in the left posterior frontal lobe. Cerebral atrophy. No intracranial mass or hemorrhage. No midline shift or extra-axial fluid collection. The central arterial and venous flow voids are patent. VENTRICLES: No hydrocephalus. ORBITS: The orbits are normal. SINUSES AND MASTOIDS: The sinuses and mastoid air cells are clear. BONES: No focal osseous lesion. Other: Chronic 1.3 cm right  parotid lesion. IMPRESSION: 1. New acute infarcts in the right frontal lobe. 2. Evolving subacute infarct in the left posterior frontal lobe. 3. Remote right MCA territory and cerebellar infarcts. Electronically signed by: Gilmore Molt MD 12/30/2023 01:26 PM EDT RP Workstation: [id number]   DG Chest 2 View Result Date: 12/30/2023 CLINICAL DATA:  ams EXAM: CHEST - 2 VIEW COMPARISON:  December 22, 2023 FINDINGS: Evaluation is limited by rotation. The cardiomediastinal silhouette is unchanged in contour.Elevation of the RIGHT hemidiaphragm. Possible trace RIGHT pleural effusion. No pneumothorax. RIGHT lower lobe heterogeneous opacities. Similar RIGHT middle lobe volume loss and opacity. Increased basilar lateral airspace opacity within the RIGHT lung. Visualized abdomen is unremarkable. Multilevel degenerative changes of the thoracic spine. IMPRESSION: 1. Increased RIGHT basilar airspace opacity. Differential considerations include atelectasis, aspiration or infection. Electronically Signed   By: Corean Salter M.D.   On: 12/30/2023 08:06   CT Head Wo Contrast Result Date: 12/30/2023 EXAM: CT HEAD WITHOUT CONTRAST 12/30/2023 07:30:05 AM TECHNIQUE: CT of the head was performed without the administration of intravenous contrast.  Automated exposure control, iterative reconstruction, and/or weight based adjustment of the mA/kV was utilized to reduce the radiation dose to as low as reasonably achievable. COMPARISON: Brain MRI 12/23/2023 and Head CT 12/22/2023. CLINICAL HISTORY: 89 year old male. Mental status change, unknown cause. AMS. Small acute infarcts in the posterior left frontal lobe and the right cerebellum on MRI this month. FINDINGS: BRAIN AND VENTRICLES: No acute intracranial hemorrhage. No new cortically based infarct identified. Pronounced chronic encephalomalacia throughout the posterior right MCA territory is stable. Small infarcts in the posterior left frontal lobe and mid right cerebellum on MRI this month are recalled by CT. Chronic infarcts and encephalomalacia in those areas unchanged by CT. Chronic thalamic lacunar infarcts, chronic cerebral white matter hypodensity, and chronic right anterior external capsule lacunar infarcts are stable. Stable cerebral volume. No intracranial mass effect. No ventriculomegaly. No extra-axial collection. No midline shift. No suspicious intracranial vascular hyperdensity. Advanced calcified atherosclerosis. ORBITS: No acute orbital abnormality. SINUSES: Stable paranasal sinus, middle ear and mastoid aeration. SOFT TISSUES AND SKULL: No acute soft tissue abnormality. No acute scalp soft tissue finding. No skull fracture. IMPRESSION: 1. No acute intracranial abnormality. 2. Recent small acute infarcts on MRI remain occult by CT. Stable other advanced chronic ischemic disease. Electronically signed by: Helayne Hurst MD 12/30/2023 07:55 AM EDT RP Workstation: [id number]   ECHOCARDIOGRAM COMPLETE Result Date: 12/23/2023    ECHOCARDIOGRAM REPORT   Patient Name:   AMEDEE CERRONE Date of Exam: 12/23/2023 Medical Rec #:  [id number]     Height:       72.0 in Accession #:    [id number]    Weight:       230.2 lb Date of Birth:  [birth year]    BSA:          2.262 m Patient Age:    89 years      BP:            138/72 mmHg Patient Gender: M             HR:           82 bpm. Exam Location:  Inpatient Procedure: 2D Echo (Both Spectral and Color Flow Doppler were utilized during            procedure). Indications:    stroke  History:        Patient has prior history of Echocardiogram examinations, most  recent 05/10/2022. Chronic kidney disease, Arrythmias:Atrial                 Fibrillation and PVC; Risk Factors:Dyslipidemia.  Sonographer:    Tinnie Barefoot RDCS Referring Phys: [id number] TIMOTHY S OPYD IMPRESSIONS  1. Left ventricular ejection fraction, by estimation, is 60 to 65%. The left ventricle has normal function. The left ventricle has no regional wall motion abnormalities. Left ventricular diastolic parameters were normal.  2. Right ventricular systolic function is normal. The right ventricular size is normal.  3. The mitral valve is normal in structure. Trivial mitral valve regurgitation. No evidence of mitral stenosis.  4. DVI 0.31. The aortic valve is normal in structure. Aortic valve regurgitation is mild. Moderate aortic valve stenosis. Aortic valve area, by VTI measures 1.08 cm. Aortic valve mean gradient measures 24.0 mmHg. Aortic valve Vmax measures 3.17 m/s.  5. The inferior vena cava not well visualized. FINDINGS  Left Ventricle: Left ventricular ejection fraction, by estimation, is 60 to 65%. The left ventricle has normal function. The left ventricle has no regional wall motion abnormalities. The left ventricular internal cavity size was normal in size. There is  no left ventricular hypertrophy. Left ventricular diastolic parameters were normal. Right Ventricle: The right ventricular size is normal. No increase in right ventricular wall thickness. Right ventricular systolic function is normal. Left Atrium: Left atrial size was normal in size. Right Atrium: Right atrial size was normal in size. Pericardium: There is no evidence of pericardial effusion. Mitral Valve: The mitral valve is  normal in structure. Trivial mitral valve regurgitation. No evidence of mitral valve stenosis. Tricuspid Valve: The tricuspid valve is normal in structure. Tricuspid valve regurgitation is not demonstrated. No evidence of tricuspid stenosis. Aortic Valve: DVI 0.31. The aortic valve is normal in structure. Aortic valve regurgitation is mild. Aortic regurgitation PHT measures 278 msec. Moderate aortic stenosis is present. Aortic valve mean gradient measures 24.0 mmHg. Aortic valve peak gradient measures 40.2 mmHg. Aortic valve area, by VTI measures 1.08 cm. Pulmonic Valve: The pulmonic valve was not well visualized. Pulmonic valve regurgitation is not visualized. No evidence of pulmonic stenosis. Aorta: Aortic root could not be assessed. Venous: The inferior vena cava not well visualized. IAS/Shunts: No atrial level shunt detected by color flow Doppler.  LEFT VENTRICLE PLAX 2D LVIDd:         5.80 cm   Diastology LVIDs:         3.80 cm   LV e' medial:    5.55 cm/s LV PW:         1.20 cm   LV E/e' medial:  14.4 LV IVS:        1.40 cm   LV e' lateral:   11.50 cm/s LVOT diam:     2.10 cm   LV E/e' lateral: 6.9 LV SV:         70 LV SV Index:   31 LVOT Area:     3.46 cm  RIGHT VENTRICLE RV Basal diam:  3.20 cm RV S prime:     13.30 cm/s TAPSE (M-mode): 2.4 cm LEFT ATRIUM             Index        RIGHT ATRIUM           Index LA diam:        5.40 cm 2.39 cm/m   RA Area:     14.00 cm LA Vol (A2C):   65.4 ml 28.92 ml/m  RA Volume:  35.10 ml  15.52 ml/m LA Vol (A4C):   60.4 ml 26.71 ml/m LA Biplane Vol: 63.1 ml 27.90 ml/m  AORTIC VALVE AV Area (Vmax):    1.09 cm AV Area (Vmean):   1.03 cm AV Area (VTI):     1.08 cm AV Vmax:           317.00 cm/s AV Vmean:          228.000 cm/s AV VTI:            0.654 m AV Peak Grad:      40.2 mmHg AV Mean Grad:      24.0 mmHg LVOT Vmax:         100.00 cm/s LVOT Vmean:        68.000 cm/s LVOT VTI:          0.203 m LVOT/AV VTI ratio: 0.31 AI PHT:            278 msec  AORTA Ao Root  diam: 3.20 cm MR Peak grad: 144.5 mmHg MR Mean grad: 87.0 mmHg    SHUNTS MR Vmax:      601.00 cm/s  Systemic VTI:  0.20 m MR Vmean:     432.0 cm/s   Systemic Diam: 2.10 cm MV E velocity: 79.80 cm/s MV A velocity: 81.90 cm/s MV E/A ratio:  0.97 Franck Azobou Tonleu Electronically signed by Joelle Cedars Tonleu Signature Date/Time: 12/23/2023/12:59:02 PM    Final    MR BRAIN WO CONTRAST Result Date: 12/23/2023 EXAM: MRI BRAIN WITHOUT CONTRAST 12/23/2023 05:36:44 AM TECHNIQUE: Multiplanar multisequence MRI of the head/brain was performed without the administration of intravenous contrast. COMPARISON: MRI of the head dated 05/10/2022. CLINICAL HISTORY: Neuro deficit, acute, stroke suspected. FINDINGS: BRAIN AND VENTRICLES: Acute cortical infarct present within the cortex of the left precentral gyrus seen on image 86 of series 5. Acute lacunar infarct within the right cerebellar hemisphere seen on image 62. There are chronic encephalomalacia changes within the right parietal, temporal, and occipital lobes secondary to the infarct noted on the previous study. There are also chronic encephalomalacia changes within the frontal lobes bilaterally. There is moderate generalized cerebral volume loss and advanced periventricular and deep cerebral white matter disease. No intracranial hemorrhage. No mass. No midline shift. No hydrocephalus. The sella is unremarkable. Normal flow voids. ORBITS: No acute abnormality. SINUSES AND MASTOIDS: No acute abnormality. BONES AND SOFT TISSUES: Normal marrow signal. No acute soft tissue abnormality. IMPRESSION: 1. Acute cortical infarct in the left precentral gyrus and acute lacunar infarct in the right cerebellar hemisphere. 2. Chronic encephalomalacia in the right parietal, temporal, and occipital lobes, and in the frontal lobes bilaterally. 3. Moderate generalized cerebral volume loss and advanced periventricular and deep cerebral white matter disease. Electronically signed by: Evalene Coho MD 12/23/2023 10:25 AM EDT RP Workstation: [id number]   CT CHEST W CONTRAST Result Date: 12/22/2023 CLINICAL DATA:  Provided history: Respiratory illness, nondiagnostic xray EXAM: CT CHEST WITH CONTRAST TECHNIQUE: Multidetector CT imaging of the chest was performed during intravenous contrast administration. RADIATION DOSE REDUCTION: This exam was performed according to the departmental dose-optimization program which includes automated exposure control, adjustment of the mA and/or kV according to patient size and/or use of iterative reconstruction technique. CONTRAST:  75mL OMNIPAQUE  IOHEXOL  350 MG/ML SOLN COMPARISON:  Radiograph earlier today FINDINGS: Cardiovascular: The heart is normal in size. No pericardial effusion. Coronary artery calcifications. Irregular plaque throughout the thoracic aorta. The ascending aorta is dilated at 4.5 cm. No dissection or acute aortic finding.  No central pulmonary embolus on this non dedicated exam. Mediastinum/Nodes: Shotty mediastinal and hilar lymph nodes, all subcentimeter short axis. Decompressed esophagus. No thyroid  nodule. Lungs/Pleura: Elevated right hemidiaphragm. Adjacent airspace disease in the right lower and middle lobe likely compressive atelectasis. Moderate emphysema. Dependent ground-glass and consolidative opacity in the right upper lobe abutting the fissure and peripherally. Nodular areas of airspace disease in the left lower lobe, series 4, image 121. For example elongated nodular consolidation measures 4 x 1.3 cm, series 4, image 121. No pleural effusion. Upper Abdomen: Elevated right hemidiaphragm. Bilateral renal cysts need no further imaging follow-up. Excreted IV contrast in the renal collecting systems from prior head and neck CTA. Musculoskeletal: Diffuse thoracic spondylosis. There are no acute or suspicious osseous abnormalities. IMPRESSION: 1. Dependent ground-glass and consolidative opacity in the right upper lobe abutting the fissure  and peripherally, suspicious for pneumonia. 2. Nodular areas of airspace disease in the left lower lobe, largest measuring 4 x 1.3 cm also suspicious for infectio; however neoplasm is not excluded. Recommend follow-up CT in 4-6 weeks after course of treatment. 3. Elevated right hemidiaphragm with adjacent airspace disease in the right lower and middle lobes, likely compressive atelectasis. 4. Ascending aortic dilatation at 4.5 cm. Recommend semi-annual imaging followup by CTA or MRA and referral to cardiothoracic surgery if not already obtained. This recommendation follows 2010 ACCF/AHA/AATS/ACR/ASA/SCA/SCAI/SIR/STS/SVM Guidelines for the Diagnosis and Management of Patients With Thoracic Aortic Disease. Circulation. 2010; 121: Z733-z630. Aortic aneurysm NOS (ICD10-I71.9) Aortic Atherosclerosis (ICD10-I70.0) and Emphysema (ICD10-J43.9). Electronically Signed   By: Andrea Gasman M.D.   On: 12/22/2023 22:03   CT ANGIO HEAD NECK W WO CM (CODE STROKE) Addendum Date: 12/22/2023 ADDENDUM REPORT: 12/22/2023 19:11 ADDENDUM: CTA head impression #1 called by telephone at the time of interpretation on 12/22/2023 at 7:08 pm to provider Dr. Jakie, who verbally acknowledged these results. The progressive focus of ulcerated plaque at the distal right common carotid artery was also discussed at this time. Electronically Signed   By: Rockey Childs D.O.   On: 12/22/2023 19:11   Result Date: 12/22/2023 CLINICAL DATA:  Provided history: Neuro deficit, acute, stroke suspected. EXAM: CT ANGIOGRAPHY HEAD AND NECK WITH AND WITHOUT CONTRAST TECHNIQUE: Multidetector CT imaging of the head and neck was performed using the standard protocol during bolus administration of intravenous contrast. Multiplanar CT image reconstructions and MIPs were obtained to evaluate the vascular anatomy. Carotid stenosis measurements (when applicable) are obtained utilizing NASCET criteria, using the distal internal carotid diameter as the denominator.  RADIATION DOSE REDUCTION: This exam was performed according to the departmental dose-optimization program which includes automated exposure control, adjustment of the mA and/or kV according to patient size and/or use of iterative reconstruction technique. CONTRAST:  75mL OMNIPAQUE  IOHEXOL  350 MG/ML SOLN COMPARISON:  Noncontrast head CT performed earlier today 12/22/2023. MRA head 05/10/2022. CT angiogram head/neck 05/10/2022. FINDINGS: CTA NECK FINDINGS Aortic arch: Standard aortic branching. Atherosclerotic plaque within the visualized aortic arch and proximal major branch vessels of the neck. Streak/beam hardening artifact arising from a dense contrast bolus partially obscures the left subclavian artery. Within this limitation, there is no appreciable hemodynamically significant innominate or proximal subclavian artery stenosis. Right carotid system: CCA and ICA patent within the neck. Atherosclerotic plaque. Muscle the, there is an sclerotic plaque at the carotid bifurcation and within the proximal ICA resulting in less than 50% stenosis. Progressive focus of ulcerated plaque anteriorly at the distal common carotid artery, now measuring 4 mm (series 5, image 230). Partially retropharyngeal course of  the cervical ICA. Left carotid system: CCA and ICA patent within the neck. Atherosclerotic plaque. Most notably, progressive (mildly calcified) plaque within the proximal ICA results in an estimated 60-70% stenosis which has progressed (series 5, images 214 and 215). Vertebral arteries: Patent within the neck. Severe stenosis at the right vertebral artery origin, unchanged. Atherosclerotic plaque within the right vertebral artery at the V3/V4 junction with up to moderate stenosis. The left vertebral artery is non dominant and developmentally diminutive, but patent throughout the neck. Atherosclerotic plaque at the left vertebral artery origin. Skeleton: The patient is edentulous. Spondylosis of the cervical and  visible thoracic levels. No acute fracture or aggressive osseous lesion. Other neck: 12 mm ovoid nodule again demonstrated within the superficial lobe of the right parotid gland. This is suspicious for a primary parotid neoplasm. Upper chest: Incompletely imaged cystic or cavitary lesion within the right lower lobe (measuring at least 4.6 cm (series 5, image 344). Mild dependent atelectasis within the bilateral upper lobes and right lower lobe at the imaged levels. Review of the MIP images confirms the above findings CTA HEAD FINDINGS Anterior circulation: The intracranial internal carotid arteries are patent. The right MCA proximal M2 branch which was occluded on the prior CTA of 05/10/2022 is occluded more proximally on today's study. Additionally, there is a new severe stenosis within this vessel immediately upstream. Additional atherosclerotic irregularity of more distal M2 right MCA vessels. The left middle cerebral artery M1 segment is patent. No left M2 proximal branch occlusion or high-grade proximal stenosis is identified. The anterior cerebral arteries are patent. No intracranial aneurysm is identified. Posterior circulation: The intracranial vertebral arteries are patent. Progressive atherosclerotic plaque within the right vertebral artery at the V3/V4 junction now with up to moderate stenosis. The basilar artery is patent. The posterior cerebral arteries are patent. Atherosclerotic irregularity of both vessels without high-grade proximal stenosis. Fetal origin right PCA. The left posterior communicating artery is diminutive or absent. Venous sinuses: Limited assessment for dural venous sinus thrombosis due to contrast timing. Anatomic variants: As described. Review of the MIP images confirms the above findings Attempts are being made to reach the ordering provider at this time. IMPRESSION: CTA neck: 1. The common carotid and internal carotid arteries are patent within the neck. Atherosclerotic plaque  bilaterally, as described. Progressive atherosclerotic plaque within the proximal left internal carotid artery now with an estimated 60-70% stenosis at this site. Progressive focus of ulcerated plaque anteriorly at the distal right common carotid artery. 2. The right vertebral artery is dominant and patent within the neck. Unchanged severe atherosclerotic narrowing at the right vertebral artery origin. Progressive atherosclerotic plaque within the right vertebral artery at the V3/V4 junction resulting in up to moderate stenosis. 3. The left vertebral artery is non-dominant and developmentally diminutive, but patent throughout the neck. 4. Aortic Atherosclerosis (ICD10-I70.0). 5. Partially imaged cystic or cavitary pulmonary lesion within the right lower lobe. A dedicated chest CT is recommended for further characterization. 6. Unchanged 12 mm right parotid gland nodule suspicious for a primary parotid neoplasm. ENT follow-up recommended. CTA head: 1. The right middle cerebral artery proximal M2 branch which was occluded on the prior CTA of 05/10/2022 is occluded more proximally on today's study. Additionally, there is a new severe stenosis within this vessel immediately upstream. 2. No proximal intracranial large vessel occlusion identified elsewhere. 3. Background intracranial atherosclerotic disease as described. Electronically Signed: By: Rockey Childs D.O. On: 12/22/2023 19:06   DG Chest Portable 1 View Result Date: 12/22/2023 CLINICAL  DATA:  Shortness of breath EXAM: PORTABLE CHEST - 1 VIEW COMPARISON:  July 04, 2013 FINDINGS: Low lung volumes with bronchovascular crowding. Elevation of the right hemidiaphragm with streaky right basilar atelectasis. Biapical pleural thickening. No pneumothorax or pleural effusion. Mild cardiomegaly.Cardiac loop recorder device.No acute fracture or destructive lesion. Multilevel thoracic osteophytosis. IMPRESSION: No acute cardiopulmonary abnormality. Electronically Signed    By: Rogelia Myers M.D.   On: 12/22/2023 18:49   CT HEAD CODE STROKE WO CONTRAST Result Date: 12/22/2023 CLINICAL DATA:  Code stroke. Neuro deficit, acute, stroke suspected. Dysarthria. Right facial droop. EXAM: CT HEAD WITHOUT CONTRAST TECHNIQUE: Contiguous axial images were obtained from the base of the skull through the vertex without intravenous contrast. RADIATION DOSE REDUCTION: This exam was performed according to the departmental dose-optimization program which includes automated exposure control, adjustment of the mA and/or kV according to patient size and/or use of iterative reconstruction technique. COMPARISON:  Head CT 05/16/2022. FINDINGS: Brain: Generalized cerebral atrophy. Large chronic cortical/subcortical right MCA/PCA territory infarct, increased in extent as compared to the prior head CT of 05/16/2022. Moderate-sized cortical/subcortical infarct within the mid left frontal lobe (MCA territory), new from the prior CT but chronic in appearance. Redemonstrated chronic cortical/subcortical infarct within the left parietal lobe (MCA territory). Unchanged chronic lacunar infarct within/about the right basal ganglia. Unchanged chronic lacunar infarct within the left thalamus. An infarct within the right thalamus is new from the prior head CT, however, there is associated volume loss at this site and this appears chronic. Background patchy and ill-defined hypoattenuation within the cerebral white matter, nonspecific but compatible with chronic small vessel ischemic disease. Chronic infarcts within the bilateral cerebellar hemispheres, not appreciably changed. There is no acute intracranial hemorrhage. No acute demarcated cortical infarct is identified. No extra-axial fluid collection. No evidence of an intracranial mass. No midline shift. Vascular: No hyperdense vessel.  Atherosclerotic calcifications. Skull: No calvarial fracture or aggressive osseous lesion. Sinuses/Orbits: No mass or acute finding  within the imaged orbits. No significant paranasal sinus disease at the imaged levels. Other: Small left mastoid effusion. Associated chronic sclerotic changes within left mastoid air cells. ASPECTS [facility] Stroke Program Early CT Score) - Ganglionic level infarction (caudate, lentiform nuclei, internal capsule, insula, M1-M3 cortex): 7 - Supraganglionic infarction (M4-M6 cortex): 3 Total score (0-10 with 10 being normal): 10 No evidence of an acute intracranial abnormality. These results were communicated to Dr. Merrianne at 6:26 pmon 10/4/2025by text page via the [facility] messaging system. IMPRESSION: 1. No evidence of an acute intracranial abnormality. 2. Parenchymal atrophy, chronic small vessel ischemic disease and chronic infarcts, as described. 3. Small left mastoid effusion. Associated chronic sclerotic changes within left mastoid air cells. Electronically Signed   By: Rockey Childs D.O.   On: 12/22/2023 18:27    Labs:  CBC: Recent Labs    12/22/23 1752 12/22/23 1826 12/23/23 0413 12/30/23 0748 12/30/23 1228 12/31/23 0440  WBC 7.4  --  5.9 15.0*  --  11.6*  HGB 14.8   < > 14.3 15.6 14.6 15.4  HCT 43.6   < > 42.2 45.9 43.0 46.2  PLT 176  --  163 200  --  171   < > = values in this interval not displayed.    COAGS: Recent Labs    12/22/23 1752  INR 1.2  APTT 36    BMP: Recent Labs    12/24/23 0518 12/25/23 0517 12/30/23 0748 12/30/23 1228 12/31/23 0440  NA 139 138 142 140 136  K 4.1 4.0 3.4*  3.5 3.6  CL 105 105 101  --  101  CO2 22 23 27   --  25  GLUCOSE 140* 152* 105*  --  113*  BUN 27* 32* 19  --  16  CALCIUM  9.0 8.9 8.5*  --  8.4*  CREATININE 1.53* 1.34* 1.29*  --  1.27*  GFRNONAA 44* 51* 54*  --  55*    LIVER FUNCTION TESTS: Recent Labs    12/22/23 1752 12/30/23 0748 12/31/23 0440  BILITOT 0.6 0.9 1.0  AST 20 30 20   ALT 17 67* 48*  ALKPHOS 145* 136* 132*  PROT 7.0 6.7 6.4*  ALBUMIN 3.7 3.3* 3.1*    TUMOR MARKERS: No results for input(s): AFPTM,  CEA, CA199, CHROMGRNA in the last 8760 hours.  Assessment and Plan: Patient was recently inpatient for CVA workup, and discharged with recommendation for right parotid gland biopsy as outpatient.  However, patient was seen in ED on 10/12 with new concern for right facial droop, and found to have acute left precentral gyrus and acute lacunar infarct of the right cerebral hemisphere.  Patient was previously on Xarelto , switched to Eliquis .  Currently being worked up by neurology.  However, care team has requested to pursue right parotid gland lesion biopsy. Interventional Radiology was requested for same.   Patient is tentatively scheduled for right parotid glad biopsy in IR today.   Patient has been NPO since midnight.  All labs and medications are within acceptable parameters.  No pertinent allergies.   Risks and benefits of right parotid gland lesion was discussed with the patient and/or patient's family including, but not limited to bleeding, infection, damage to adjacent structures or low yield requiring additional tests.  All of the questions were answered and there is agreement to proceed.  Consent signed and in chart.    Thank you for allowing our service to participate in Edward Maynard 's care.  Electronically Signed: Carlin DELENA Griffon, PA-C   12/31/2023, 11:30 AM      I spent a total of 40 Minutes in face to face in clinical consultation, greater than 50% of which was counseling/coordinating care for right parotid glad lesion, with consideration for biopsy.

## 2023-12-31 NOTE — Hospital Course (Addendum)
 Edward Maynard is a 89 y.o. male with medical history significant of hypertension, hyperlipidemia, history of CVA with LUE weakness residual deficits, CKD 2, atrial fibrillation on Eliquis , depression, and anxiety presented   Patient with recent hospitalization from 10/4-10/7 after presenting with difficulty speaking and right-sided facial droop found to have acute left precentral gyrus and acute lacunar infarct of the right cerebral hemisphere.  Previously had been switched from Xarelto  to Eliquis  after having stroke back in February 2024.  Neurology consulted and recommended switching to Pradaxa but not covered by insurance for which patient was continued on Eliquis .  He was also treated for multifocal pneumonia with treated with antibiotics and course of steroids which were completed prior to readmission  Patient expresses feeling tired since being discharged from the hospital, but denies having any significant cough or fever.  He was not on a modified diet to his knowledge and has not had any significant coughing or choking events with the eating.  Although he does report still having a mild cough.    Per review of EMS run sheet at the facility staff reported that his speech  maybemore slurred and felt off while getting him up to the chair.     In the ED patient was noted to be afebrile with blood pressures elevated up to 160/90, and all other vital signs maintained.  Labs significant for WBC 15, thanks potassium 3.4, BUN 19, creatinine 1.27, glucose 187, and alkaline phosphatase 145.  Chest x-ray noted increased right basilar airspace opacity concerning for atelectasis, aspiration, versus infection.  Patient had been ordered 1 L of lactated Ringer's, Rocephin, and azithromycin.   Assessment and Plan:   Acute CVA - recurrent CVA; previous MRI brain on 12/23/2023 showed  Acute cortical infarct in the left precentral gyrus and acute lacunar infarct in the right cerebellar hemisphere. - Now,  repeat MRI brain on 12/30/2023 shows new infarcts involving right frontal lobe and evolving subacute infarct of the left posterior frontal lobe.  Remote infarcts noted in the right MCA territory and cerebellum as previously mentioned - Unable to be switched from Eliquis  due to insurance issues - Neurology recommending to continue on Eliquis  and adding baby aspirin  indefinitely - Follow-up PT/OT evals.  Patient resides at SNF and mostly bedbound/wheelchair-bound - Continue Lipitor  Acute Encephalopathy - resolved  - Patient was exhibiting some altered mentation along with potential dysarthria - Admitted for stroke workup - As of 10/13 back to normal baseline  History of recent pneumonia Possible aspiration Patient had just completed antibiotics and steroid taper for multifocal pneumonia during his most recent hospitalization.  Repeat chest x-ray noted increased right basilar airspace disease given concern for aspiration versus atelectasis versus infection. -Nontoxic-appearing and afebrile -Some leukocytosis on admission which may also be reactive.  Procalcitonin negative -Discontinue antibiotics and monitor off -Evaluated by SLP.  Some delayed cough but no overt signs or symptoms of aspiration.  Continue dysphagia 3 diet   History of CVA with residual deficit Patient with prior history of stroke back in 2024 for which led to significant left-sided weakness which he has basically been bedbound.  At that time switch from Xarelto  to Eliquis .  Subsequently, had a new stroke 10/4 with right sided facial droop and dysarthria.  Discussed switching to Pradaxa but insurance would not cover and therefore continued on Eliquis . - Residual left-sided weakness.  Right parotid lesion Previously recommended outpatient follow-up during last hospitalization. - Attempted to accomplish during hospitalization however radiology schedule full after trying to  incorporate patient -Outpatient biopsy will be planned  and radiology department aware to contact patient for scheduling biopsy   Hypokalemia -Replete as needed   PAF Patient appears to be in a sinus rhythm at this time.   - Continue amiodarone, Cardizem , and Eliquis    Hyperlipidemia - Continue atorvastatin     CKD stage IIIa Stable.  On admission creatinine noted to be 1.27 with BUN 19.  Baseline Cr 1.1-1.2   - Continue to monitor   Prediabetes On admission glucose elevated up to 197.  Last Hemoglobin A1c 5.6.  Depression/anxiety - Continue Prozac, Remeron , trazodone    Ascending aortic dilatation at 4.5 cm - Recommend semi-annual imaging followup by CTA or MRA

## 2023-12-31 NOTE — Progress Notes (Signed)
 Patient dressed and ready for discharge at 5pm. Patient had an incontinent episode of stool. Patient cleansed and pants placed in belongings bag.

## 2023-12-31 NOTE — NC FL2 (Signed)
 [facility]  MEDICAID FL2 LEVEL OF CARE FORM     IDENTIFICATION  Patient Name: Edward Maynard Birthdate: [birth year] Sex: male Admission Date (Current Location): 12/30/2023  [facility] and [place] Number:  Producer, television/film/video and Address:  [facility], [address], [place], [place] [address]      Provider Number: [id number]  Attending Physician Name and Address:  [name], MD  Relative Name and Phone Number:       Current Level of Care: Hospital Recommended Level of Care: Assisted Living Facility Prior Approval Number:    Date Approved/Denied:   PASRR Number:    Discharge Plan: Other (Comment) (ALF)    Current Diagnoses: Patient Active Problem List   Diagnosis Date Noted   Acute CVA (cerebrovascular accident) (HCC) 12/31/2023   Hypokalemia 12/30/2023   Prediabetes 12/30/2023   Lesion of parotid gland 12/30/2023   AAA (abdominal aortic aneurysm) 12/30/2023   Acute focal neurological deficit 12/22/2023   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   Pulmonary lesion, right 12/22/2023   Anxiety and depression 12/22/2023   Shoulder impingement syndrome, right 08/10/2022   CKD (chronic kidney disease) 05/18/2022   Insomnia 05/18/2022   CVA (cerebral vascular accident) (HCC) 05/18/2022   Essential hypertension 05/16/2022   Obesity 05/16/2022   Advanced age 89/27/2024   Dysphagia 05/16/2022   History of stroke with current residual effects 05/10/2022   Middle cerebral artery embolism, 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   Chronic a-fib (HCC) 05/10/2022   Paroxysmal atrial fibrillation (HCC) 01/18/2015   Chronic anticoagulation 01/18/2015   PVC's (premature ventricular contractions) 12/14/2014   NSVT (nonsustained ventricular tachycardia) (HCC) 12/14/2014   Cerebral infarction due to embolism of cerebral artery (HCC) 07/10/2014   Hyperlipidemia 12/12/2013   History of colonic polyps 10/23/2013    Orientation RESPIRATION BLADDER Height & Weight     Self,  Place  Normal Incontinent Weight: 230 lb 2.6 oz (104.4 kg) Height:  6' (182.9 cm)  BEHAVIORAL SYMPTOMS/MOOD NEUROLOGICAL BOWEL NUTRITION STATUS      Continent Diet  AMBULATORY STATUS COMMUNICATION OF NEEDS Skin   Extensive Assist Verbally Normal                       Personal Care Assistance Level of Assistance  Bathing, Feeding, Dressing Bathing Assistance: Maximum assistance Feeding assistance: Limited assistance Dressing Assistance: Maximum assistance     Functional Limitations Info  Sight, Speech, Hearing Sight Info: Adequate Hearing Info: Adequate Speech Info: Impaired (dysarthria)    SPECIAL CARE FACTORS FREQUENCY        PT Frequency: 2-3x/week with HH OT Frequency: 2-3x/week with HH     Speech Therapy Frequency: 2-3x/week with HH      Contractures Contractures Info: Not present    Additional Factors Info  Code Status, Allergies Code Status Info: DNR Allergies Info: NKA             Discharge Medications: TAKE these medications     acetaminophen  325 MG tablet Commonly known as: TYLENOL  Take 2 tablets (650 mg total) by mouth every 4 (four) hours as needed for mild pain (or temp > 37.5 C (99.5 F)).    amiodarone 200 MG tablet Commonly known as: PACERONE Take 100 mg by mouth daily.    apixaban  5 MG Tabs tablet Commonly known as: ELIQUIS  Take 1 tablet (5 mg total) by mouth 2 (two) times daily.    AQUAPHOR OINTMENT BODY EX Apply 1 application  topically daily. To bilateral extremities  aspirin  EC 81 MG tablet Take 1 tablet (81 mg total) by mouth daily. Swallow whole.    atorvastatin  80 MG tablet Commonly known as: LIPITOR Take 1 tablet (80 mg total) by mouth daily.    Cholecalciferol 25 MCG (1000 UT) capsule Take 1,000 Units by mouth daily.    diltiazem  120 MG 24 hr capsule Commonly known as: CARDIZEM  CD Take 1 capsule (120 mg total) by mouth daily.    docusate sodium  100 MG capsule Commonly known as: COLACE Take 1 capsule (100 mg  total) by mouth daily as needed for mild constipation.    FLUoxetine 10 MG capsule Commonly known as: PROZAC Take 10 mg by mouth daily.    folic acid  400 MCG tablet Commonly known as: FOLVITE  Take 400 mcg by mouth daily.    hydrocortisone 1 % ointment Apply 1 Application topically 3 (three) times daily as needed for itching.    hydrOXYzine  25 MG tablet Commonly known as: ATARAX  Take 25 mg by mouth every 6 (six) hours as needed for anxiety or itching.    mirtazapine  7.5 MG tablet Commonly known as: REMERON  Take 7.5 mg by mouth at bedtime.    traZODone  50 MG tablet Commonly known as: DESYREL  Take 50 mg by mouth at bedtime.    triamcinolone  cream 0.1 % Commonly known as: KENALOG  Apply 1 Application topically 2 (two) times daily as needed (rash). Affected areas of leg           Relevant Imaging Results:  Relevant Lab Results:   Additional Information SS#: [id number]  Luann SHAUNNA Cumming, LCSW

## 2024-01-01 ENCOUNTER — Encounter (HOSPITAL_COMMUNITY): Payer: Self-pay

## 2024-01-01 DIAGNOSIS — I119 Hypertensive heart disease without heart failure: Secondary | ICD-10-CM | POA: Diagnosis not present

## 2024-01-01 DIAGNOSIS — I4891 Unspecified atrial fibrillation: Secondary | ICD-10-CM | POA: Diagnosis not present

## 2024-01-01 DIAGNOSIS — K119 Disease of salivary gland, unspecified: Secondary | ICD-10-CM | POA: Diagnosis not present

## 2024-01-01 DIAGNOSIS — Z7901 Long term (current) use of anticoagulants: Secondary | ICD-10-CM | POA: Diagnosis not present

## 2024-01-01 DIAGNOSIS — I69352 Hemiplegia and hemiparesis following cerebral infarction affecting left dominant side: Secondary | ICD-10-CM | POA: Diagnosis not present

## 2024-01-01 DIAGNOSIS — I6381 Other cerebral infarction due to occlusion or stenosis of small artery: Secondary | ICD-10-CM | POA: Diagnosis not present

## 2024-01-01 DIAGNOSIS — I63531 Cerebral infarction due to unspecified occlusion or stenosis of right posterior cerebral artery: Secondary | ICD-10-CM | POA: Diagnosis not present

## 2024-01-01 LAB — LEGIONELLA PNEUMOPHILA SEROGP 1 UR AG: L. pneumophila Serogp 1 Ur Ag: NEGATIVE

## 2024-01-01 NOTE — Progress Notes (Signed)
 Edward Cornet, MD  [name] [facility] for US  FNA of right parotid nodule.  [name]       Previous Messages    ----- Message ----- From: [name] Sent: 01/01/2024   8:44 AM EDT To: [name]; [name] Subject: US  FNA BIOPSY SALIVARY GLAND PAROTID GLAND E*  Procedure : US  FNA BIOPSY SALIVARY GLAND PAROTID GLAND EA ADDT'L LESION  Reason : parotid mass Dx: Mass of right parotid gland [K11.8 (ICD-10-CM)]     History : CT angio neck w/wo , CT chest ab pelv w/, MC brain wo , CT head wo , CT Chest w/ , CT angio head neck w/wo , CT head code stroke w/o  Provider : [name], MD  Contact : [phone]

## 2024-01-30 ENCOUNTER — Ambulatory Visit: Admitting: Student

## 2024-02-04 NOTE — Progress Notes (Unsigned)
  Electrophysiology Office Note:   Date:  02/05/2024  ID:  Edward Maynard, DOB [birth year], MRN [id number]  Primary Cardiologist: None Electrophysiologist: Eulas FORBES Furbish, MD   Electrophysiologist:  Eulas FORBES Furbish, MD      History of Present Illness:   Edward Maynard is a 89 y.o. male with h/o PAF, PVCs and NSVT  seen today for post hospital follow up.    Patient with recent hospitalization from 10/4-10/7 after presenting with difficulty speaking and right-sided facial droop found to have acute left precentral gyrus and acute lacunar infarct of the right cerebral hemisphere. Previously had been switched from Xarelto  to Eliquis  after having stroke back in February 2024. Neurology consulted and recommended switching to Pradaxa but not covered by insurance for which patient was continued on Eliquis . He was also treated for multifocal pneumonia with treated with antibiotics and course of steroids which were completed prior to readmission   Admitted again 10/12 - 10/13 with fatigue, but no cough or fever. MRI showed new infarcts. ASA added to chronic eliquis , unable to switch from Eliquis  due to insurance.   Since discharge from hospital the patient reports doing OK from a cardiac perspective. No significant new deficit. At an assisted facility, doesn't feel like he is getting enough PT, but his daughter feels thinks he is just rushing to get back to his prior baseline. Denies chest pain, SOB, or syncope.  Review of systems complete and found to be negative unless listed in HPI.   EP Information / Studies Reviewed:    EKG is not ordered today. EKG from 12/30/2023 reviewed which showed NSR at 77 bpm       Arrhythmia/Device History ILR-Medtronic-Carelink (RRT as of 01/26/17)   Physical Exam:   VS:  BP (!) 146/76   Pulse 75   Ht 6' (1.829 m)   Wt 212 lb (96.2 kg)   SpO2 95%   BMI 28.75 kg/m    Wt Readings from Last 3 Encounters:  02/05/24 212 lb (96.2 kg)  12/30/23 230 lb 2.6 oz  (104.4 kg)  12/22/23 230 lb 2.6 oz (104.4 kg)     GEN: No acute distress NECK: No JVD; No carotid bruits CARDIAC: Regular rate and rhythm, no murmurs, rubs, gallops RESPIRATORY:  Clear to auscultation without rales, wheezing or rhonchi  ABDOMEN: Soft, non-tender, non-distended EXTREMITIES:  No edema; No deformity   ASSESSMENT AND PLAN:    Paroxysmal AF Secondary hypercoagulable state Continue eliquis  5 mg BID for CHA2DS2/VASc of at least 5. Regular on exam and recent EKG Continue amiodarone 100 mg daily Continue diltiazem  120 mg daily Recent CMET stable. TFTs today  H/o CVA Continue ASA    H/o syncope Felt to have been orthostatic   Follow up with EP Team in 6 months  Signed, Edward Prentice Passey, PA-C

## 2024-02-05 ENCOUNTER — Encounter: Payer: Self-pay | Admitting: Student

## 2024-02-05 ENCOUNTER — Ambulatory Visit: Attending: Student | Admitting: Student

## 2024-02-05 VITALS — BP 146/76 | HR 75 | Ht 72.0 in | Wt 212.0 lb

## 2024-02-05 DIAGNOSIS — I480 Paroxysmal atrial fibrillation: Secondary | ICD-10-CM | POA: Diagnosis not present

## 2024-02-05 DIAGNOSIS — I4729 Other ventricular tachycardia: Secondary | ICD-10-CM | POA: Diagnosis not present

## 2024-02-05 DIAGNOSIS — Z79899 Other long term (current) drug therapy: Secondary | ICD-10-CM

## 2024-02-05 DIAGNOSIS — Z9189 Other specified personal risk factors, not elsewhere classified: Secondary | ICD-10-CM | POA: Diagnosis not present

## 2024-02-05 DIAGNOSIS — I6601 Occlusion and stenosis of right middle cerebral artery: Secondary | ICD-10-CM | POA: Diagnosis not present

## 2024-02-05 NOTE — Patient Instructions (Signed)
 Medication Instructions:  No medication changes today. *If you need a refill on your cardiac medications before your next appointment, please call your pharmacy*  Lab Work: TSH and free T4 today.  If you have labs (blood work) drawn today and your tests are completely normal, you will receive your results only by: MyChart Message (if you have MyChart) OR A paper copy in the mail If you have any lab test that is abnormal or we need to change your treatment, we will call you to review the results.  Testing/Procedures: No testing ordered today  Follow-Up: At [facility], you and your health needs are our priority.  As part of our continuing mission to provide you with exceptional heart care, our providers are all part of one team.  This team includes your primary Cardiologist (physician) and Advanced Practice Providers or APPs (Physician Assistants and Nurse Practitioners) who all work together to provide you with the care you need, when you need it.  Your next appointment:   6 month(s)  Provider:   You may see Eulas FORBES Furbish, MD or one of the following Advanced Practice Providers on your designated Care Team:   Charlies Arthur, PA-C Mackena Plummer Andy Addis Bennie, PA-C Suzann Riddle, NP Daphne Barrack, NP    We recommend signing up for the patient portal called MyChart.  Sign up information is provided on this After Visit Summary.  MyChart is used to connect with patients for Virtual Visits (Telemedicine).  Patients are able to view lab/test results, encounter notes, upcoming appointments, etc.  Non-urgent messages can be sent to your provider as well.   To learn more about what you can do with MyChart, go to [email or web address].

## 2024-02-06 ENCOUNTER — Ambulatory Visit: Payer: Self-pay | Admitting: Student

## 2024-02-06 LAB — TSH: TSH: 2.05 u[IU]/mL (ref 0.450–4.500)

## 2024-02-06 LAB — T4, FREE: Free T4: 1.04 ng/dL (ref 0.82–1.77)

## 2024-02-23 ENCOUNTER — Observation Stay (HOSPITAL_COMMUNITY)

## 2024-02-23 ENCOUNTER — Emergency Department (HOSPITAL_COMMUNITY)

## 2024-02-23 ENCOUNTER — Inpatient Hospital Stay (HOSPITAL_COMMUNITY)
Admission: EM | Admit: 2024-02-23 | Discharge: 2024-02-28 | DRG: 065 | Disposition: A | Discharge status: Hospice | Source: Skilled Nursing Facility | Attending: Internal Medicine | Admitting: Internal Medicine

## 2024-02-23 ENCOUNTER — Encounter (HOSPITAL_COMMUNITY): Payer: Self-pay

## 2024-02-23 ENCOUNTER — Other Ambulatory Visit: Payer: Self-pay

## 2024-02-23 DIAGNOSIS — I129 Hypertensive chronic kidney disease with stage 1 through stage 4 chronic kidney disease, or unspecified chronic kidney disease: Secondary | ICD-10-CM | POA: Diagnosis present

## 2024-02-23 DIAGNOSIS — E785 Hyperlipidemia, unspecified: Secondary | ICD-10-CM | POA: Diagnosis present

## 2024-02-23 DIAGNOSIS — N1831 Chronic kidney disease, stage 3a: Secondary | ICD-10-CM

## 2024-02-23 DIAGNOSIS — I6930 Unspecified sequelae of cerebral infarction: Secondary | ICD-10-CM

## 2024-02-23 DIAGNOSIS — I480 Paroxysmal atrial fibrillation: Secondary | ICD-10-CM | POA: Diagnosis not present

## 2024-02-23 DIAGNOSIS — Z823 Family history of stroke: Secondary | ICD-10-CM

## 2024-02-23 DIAGNOSIS — G9340 Encephalopathy, unspecified: Secondary | ICD-10-CM | POA: Diagnosis not present

## 2024-02-23 DIAGNOSIS — F32A Depression, unspecified: Secondary | ICD-10-CM | POA: Diagnosis present

## 2024-02-23 DIAGNOSIS — F03A4 Unspecified dementia, mild, with anxiety: Secondary | ICD-10-CM | POA: Diagnosis present

## 2024-02-23 DIAGNOSIS — H53469 Homonymous bilateral field defects, unspecified side: Secondary | ICD-10-CM | POA: Diagnosis present

## 2024-02-23 DIAGNOSIS — I482 Chronic atrial fibrillation, unspecified: Secondary | ICD-10-CM | POA: Diagnosis not present

## 2024-02-23 DIAGNOSIS — Z515 Encounter for palliative care: Secondary | ICD-10-CM

## 2024-02-23 DIAGNOSIS — Z87891 Personal history of nicotine dependence: Secondary | ICD-10-CM

## 2024-02-23 DIAGNOSIS — I639 Cerebral infarction, unspecified: Secondary | ICD-10-CM | POA: Diagnosis not present

## 2024-02-23 DIAGNOSIS — Z79899 Other long term (current) drug therapy: Secondary | ICD-10-CM

## 2024-02-23 DIAGNOSIS — Z825 Family history of asthma and other chronic lower respiratory diseases: Secondary | ICD-10-CM

## 2024-02-23 DIAGNOSIS — G8194 Hemiplegia, unspecified affecting left nondominant side: Secondary | ICD-10-CM | POA: Diagnosis present

## 2024-02-23 DIAGNOSIS — Z8673 Personal history of transient ischemic attack (TIA), and cerebral infarction without residual deficits: Secondary | ICD-10-CM | POA: Diagnosis not present

## 2024-02-23 DIAGNOSIS — Z95818 Presence of other cardiac implants and grafts: Secondary | ICD-10-CM

## 2024-02-23 DIAGNOSIS — R9431 Abnormal electrocardiogram [ECG] [EKG]: Secondary | ICD-10-CM | POA: Diagnosis present

## 2024-02-23 DIAGNOSIS — Z860101 Personal history of adenomatous and serrated colon polyps: Secondary | ICD-10-CM

## 2024-02-23 DIAGNOSIS — Z8249 Family history of ischemic heart disease and other diseases of the circulatory system: Secondary | ICD-10-CM

## 2024-02-23 DIAGNOSIS — F03A18 Unspecified dementia, mild, with other behavioral disturbance: Secondary | ICD-10-CM | POA: Diagnosis present

## 2024-02-23 DIAGNOSIS — Z7982 Long term (current) use of aspirin: Secondary | ICD-10-CM

## 2024-02-23 DIAGNOSIS — R29810 Facial weakness: Secondary | ICD-10-CM | POA: Diagnosis present

## 2024-02-23 DIAGNOSIS — R059 Cough, unspecified: Secondary | ICD-10-CM

## 2024-02-23 DIAGNOSIS — R1310 Dysphagia, unspecified: Secondary | ICD-10-CM

## 2024-02-23 DIAGNOSIS — N189 Chronic kidney disease, unspecified: Secondary | ICD-10-CM | POA: Diagnosis present

## 2024-02-23 DIAGNOSIS — G9349 Other encephalopathy: Secondary | ICD-10-CM | POA: Diagnosis present

## 2024-02-23 DIAGNOSIS — R29710 NIHSS score 10: Secondary | ICD-10-CM | POA: Diagnosis present

## 2024-02-23 DIAGNOSIS — R471 Dysarthria and anarthria: Secondary | ICD-10-CM | POA: Diagnosis present

## 2024-02-23 DIAGNOSIS — F03A3 Unspecified dementia, mild, with mood disturbance: Secondary | ICD-10-CM | POA: Diagnosis present

## 2024-02-23 DIAGNOSIS — R414 Neurologic neglect syndrome: Secondary | ICD-10-CM | POA: Diagnosis present

## 2024-02-23 DIAGNOSIS — Z7901 Long term (current) use of anticoagulants: Secondary | ICD-10-CM

## 2024-02-23 DIAGNOSIS — I10 Essential (primary) hypertension: Secondary | ICD-10-CM

## 2024-02-23 DIAGNOSIS — Z66 Do not resuscitate: Secondary | ICD-10-CM | POA: Diagnosis present

## 2024-02-23 DIAGNOSIS — N182 Chronic kidney disease, stage 2 (mild): Secondary | ICD-10-CM | POA: Diagnosis present

## 2024-02-23 DIAGNOSIS — E782 Mixed hyperlipidemia: Secondary | ICD-10-CM

## 2024-02-23 DIAGNOSIS — E7800 Pure hypercholesterolemia, unspecified: Secondary | ICD-10-CM | POA: Diagnosis present

## 2024-02-23 DIAGNOSIS — I63511 Cerebral infarction due to unspecified occlusion or stenosis of right middle cerebral artery: Principal | ICD-10-CM | POA: Diagnosis present

## 2024-02-23 LAB — CBC
HCT: 43.5 % (ref 39.0–52.0)
Hemoglobin: 14.5 g/dL (ref 13.0–17.0)
MCH: 31.1 pg (ref 26.0–34.0)
MCHC: 33.3 g/dL (ref 30.0–36.0)
MCV: 93.3 fL (ref 80.0–100.0)
Platelets: 162 K/uL (ref 150–400)
RBC: 4.66 MIL/uL (ref 4.22–5.81)
RDW: 13.6 % (ref 11.5–15.5)
WBC: 8.7 K/uL (ref 4.0–10.5)
nRBC: 0 % (ref 0.0–0.2)

## 2024-02-23 LAB — RAPID URINE DRUG SCREEN, HOSP PERFORMED
Amphetamines: NOT DETECTED
Barbiturates: NOT DETECTED
Benzodiazepines: NOT DETECTED
Cocaine: NOT DETECTED
Opiates: NOT DETECTED
Tetrahydrocannabinol: NOT DETECTED

## 2024-02-23 LAB — COMPREHENSIVE METABOLIC PANEL WITH GFR
ALT: 28 U/L (ref 0–44)
AST: 20 U/L (ref 15–41)
Albumin: 3.6 g/dL (ref 3.5–5.0)
Alkaline Phosphatase: 128 U/L — ABNORMAL HIGH (ref 38–126)
Anion gap: 8 (ref 5–15)
BUN: 12 mg/dL (ref 8–23)
CO2: 27 mmol/L (ref 22–32)
Calcium: 9.2 mg/dL (ref 8.9–10.3)
Chloride: 105 mmol/L (ref 98–111)
Creatinine, Ser: 1.21 mg/dL (ref 0.61–1.24)
GFR, Estimated: 58 mL/min — ABNORMAL LOW (ref >60)
Glucose, Bld: 95 mg/dL (ref 70–99)
Potassium: 3.8 mmol/L (ref 3.5–5.1)
Sodium: 140 mmol/L (ref 135–145)
Total Bilirubin: 0.7 mg/dL (ref 0.0–1.2)
Total Protein: 7 g/dL (ref 6.5–8.1)

## 2024-02-23 LAB — MAGNESIUM: Magnesium: 1.8 mg/dL (ref 1.7–2.4)

## 2024-02-23 LAB — URINALYSIS, ROUTINE W REFLEX MICROSCOPIC
Bacteria, UA: NONE SEEN
Bilirubin Urine: NEGATIVE
Glucose, UA: NEGATIVE mg/dL
Hgb urine dipstick: NEGATIVE
Ketones, ur: NEGATIVE mg/dL
Leukocytes,Ua: NEGATIVE
Nitrite: NEGATIVE
Protein, ur: 30 mg/dL — AB
Specific Gravity, Urine: 1.014 (ref 1.005–1.030)
pH: 6 (ref 5.0–8.0)

## 2024-02-23 LAB — RESP PANEL BY RT-PCR (RSV, FLU A&B, COVID)  RVPGX2
Influenza A by PCR: NEGATIVE
Influenza B by PCR: NEGATIVE
Resp Syncytial Virus by PCR: NEGATIVE
SARS Coronavirus 2 by RT PCR: NEGATIVE

## 2024-02-23 LAB — CK: Total CK: 57 U/L (ref 49–397)

## 2024-02-23 LAB — CBG MONITORING, ED: Glucose-Capillary: 99 mg/dL (ref 70–99)

## 2024-02-23 LAB — PROCALCITONIN: Procalcitonin: <0.1 ng/mL

## 2024-02-23 LAB — AMMONIA: Ammonia: 15 umol/L (ref 9–35)

## 2024-02-23 LAB — PHOSPHORUS: Phosphorus: 3.2 mg/dL (ref 2.5–4.6)

## 2024-02-23 LAB — LACTIC ACID, PLASMA: Lactic Acid, Venous: 1 mmol/L (ref 0.5–1.9)

## 2024-02-23 MED ORDER — SODIUM CHLORIDE 0.9 % IV SOLN: INTRAVENOUS | Status: DC

## 2024-02-23 MED ORDER — ACETAMINOPHEN 325 MG PO TABS
650.0000 mg | ORAL_TABLET | ORAL | Status: DC | PRN
  Filled 2024-02-23: qty 2

## 2024-02-23 MED ORDER — ASPIRIN 300 MG RE SUPP: 300.0000 mg | Freq: Every day | RECTAL | Status: DC

## 2024-02-23 MED ORDER — ACETAMINOPHEN 160 MG/5ML PO SOLN: 650.0000 mg | ORAL | Status: DC | PRN

## 2024-02-23 MED ORDER — STROKE: EARLY STAGES OF RECOVERY BOOK
Freq: Once | Status: AC
  Filled 2024-02-23: qty 1

## 2024-02-23 MED ORDER — ACETAMINOPHEN 650 MG RE SUPP: 650.0000 mg | RECTAL | Status: DC | PRN

## 2024-02-23 MED ORDER — IOHEXOL 350 MG/ML SOLN
75.0000 mL | Freq: Once | INTRAVENOUS | Status: AC | PRN
  Administered 2024-02-23: 75 mL via INTRAVENOUS

## 2024-02-23 MED ORDER — ASPIRIN 325 MG PO TABS
325.0000 mg | ORAL_TABLET | Freq: Every day | ORAL | Status: DC
  Filled 2024-02-23: qty 1

## 2024-02-23 NOTE — Assessment & Plan Note (Signed)
 Restart Lipitor  80 mg daily unable

## 2024-02-23 NOTE — ED Provider Notes (Signed)
 [facility] EMERGENC[facility] DEPARTMENT AT [facility] Provider Note   CSN: [id number] Arrival date & time: 02/23/24  1344     Patient presents with: Altered Mental Status   Edward Maynard is a 89 y.o. male.  {Add pertinent medical, surgical, social history, OB history to HPI:6049} 89 year old male with past medical history of CVA with left-sided deficits in the past presenting to the emergency department today with change in mental status.  The patient apparently this morning was having some left-sided neglect and is not as talkative as normal.  Does have left-sided weakness at baseline and this seems to be relatively stable although today when they were try to get the patient to transfer they were unable to which is out of the ordinary.  He was brought to the ER at that time for further evaluation.  Last normal per nursing staff at his assisted living facility was 7 AM.  He was brought to the ER for further evaluation.   Altered Mental Status      Prior to Admission medications   Medication Sig Start Date End Date Taking? Authorizing Provider  acetaminophen  (TYLENOL ) 325 MG tablet Take 2 tablets (650 mg total) by mouth every 4 (four) hours as needed for mild pain (or temp > 37.5 C (99.5 F)). 06/07/22   Angiulli, Toribio PARAS, PA-C  amiodarone  (PACERONE ) 200 MG tablet Take 100 mg by mouth daily. 12/25/23   [provider]  apixaban  (ELIQUIS ) 5 MG TABS tablet Take 1 tablet (5 mg total) by mouth 2 (two) times daily. 05/18/22   Waddell Karna LABOR, NP  aspirin  EC 81 MG tablet Take 1 tablet (81 mg total) by mouth daily. Swallow whole. 12/31/23 12/30/24  [name], MD  atorvastatin  (LIPITOR ) 80 MG tablet Take 1 tablet (80 mg total) by mouth daily. 12/26/23   Rojelio Nest, DO  Cholecalciferol 25 MCG (1000 UT) capsule Take 1,000 Units by mouth daily.    [provider]  diltiazem  (CARDIZEM  CD) 120 MG 24 hr capsule Take 1 capsule (120 mg total) by mouth daily. 12/26/23   Rojelio Nest, DO  docusate sodium  (COLACE) 100 MG capsule Take 1 capsule (100 mg total) by mouth daily as needed for mild constipation. 11/03/22   Darlean Maus, NP  Emollient (AQUAPHOR OINTMENT BODY EX) Apply 1 application  topically daily. To bilateral extremities    [provider]  FLUoxetine  (PROZAC ) 10 MG capsule Take 10 mg by mouth daily.    [provider]  folic acid  (FOLVITE ) 400 MCG tablet Take 400 mcg by mouth daily.    [provider]  hydrocortisone  1 % ointment Apply 1 Application topically 3 (three) times daily as needed for itching.    [provider]  hydrOXYzine  (ATARAX ) 25 MG tablet Take 25 mg by mouth every 6 (six) hours as needed for anxiety or itching.    [provider]  mirtazapine  (REMERON ) 7.5 MG tablet Take 7.5 mg by mouth at bedtime.    [provider]  traZODone  (DESYREL ) 50 MG tablet Take 50 mg by mouth at bedtime.    [provider]  triamcinolone  cream (KENALOG ) 0.1 % Apply 1 Application topically 2 (two) times daily as needed (rash). Affected areas of leg    [provider]    Allergies: Patient has no known allergies.    Review of Systems  Reason unable to perform ROS: Mental status change.  All other systems reviewed and are negative.   Updated Vital Signs BP  119/62   Pulse 62   Temp 97.6 F (36.4 C) (Oral)   Resp 18   SpO2 97%   Physical Exam Vitals and nursing note reviewed.   Gen: NAD Eyes: PERRL, EOMI HEENT: no oropharyngeal swelling Neck: trachea midline Resp: clear to auscultation bilaterally Card: RRR, no murmurs, rubs, or gallops Abd: nontender, nondistended Extremities: no calf tenderness, no edema Vascular: 2+ radial pulses bilaterally, 2+ DP pulses bilaterally Neuro: Patient does have some left-sided neglect, sided facial droop with drift in the left upper and lower extremity although patient is able to lift this off of the bed, is able to follow commands but does  seem to have some expressive aphasia Skin: no rashes Psyc: acting appropriately   (all labs ordered are listed, but only abnormal results are displayed) Labs Reviewed  COMPREHENSIVE METABOLIC PANEL WITH GFR - Abnormal; Notable for the following components:      Result Value   Alkaline Phosphatase 128 (*)    GFR, Estimated 58 (*)    All other components within normal limits  CBC  URINALYSIS, ROUTINE W REFLEX MICROSCOPIC  CBG MONITORING, ED    EKG: None  Radiology: CT Head Wo Contrast Result Date: 02/23/2024 EXAM: CT HEAD WITHOUT CONTRAST 02/23/2024 03:12:44 PM TECHNIQUE: CT of the head was performed without the administration of intravenous contrast. Automated exposure control, iterative reconstruction, and/or weight based adjustment of the mA/kV was utilized to reduce the radiation dose to as low as reasonably achievable. COMPARISON: CT head without contrast 12/30/2023. CLINICAL HISTORY: Delirium. FINDINGS: LIMITATIONS/ARTIFACTS: The study is moderately degraded by patient motion. BRAIN AND VENTRICLES: No acute hemorrhage. No evidence of acute infarct. Remote posterior right MCA territory infarct is again noted. Remote lacunar infarcts are present within the cerebellum bilaterally. Moderate white matter changes or similar to prior study. Atherosclerotic calcifications are present in the cavernous carotid arteries bilaterally and at the dural margin of both vertebral arteries. No hyperdense vessel is present. No hydrocephalus. No extra-axial collection. No mass effect or midline shift. ORBITS: No acute abnormality. SINUSES: No acute abnormality. SOFT TISSUES AND SKULL: No acute soft tissue abnormality. No skull fracture. IMPRESSION: 1. No acute intracranial abnormality. 2. Remote posterior right MCA territory infarct and remote bilateral cerebellar lacunar infarcts. 3. Moderate white matter changes, similar to prior study. 4. Atherosclerotic calcifications in the cavernous carotid arteries  bilaterally and at the dural margin of both vertebral arteries. No hyperdense vessel is present. Electronically signed by: Lonni Necessary MD 02/23/2024 03:32 PM EST RP Workstation: [id number]    {Document cardiac monitor, telemetry assessment procedure when appropriate:32947} Procedures   Medications Ordered in the ED - No data to display    {Click here for ABCD2, HEART and other calculators REFRESH Note before signing:1}                              Medical Decision Making 89 year old male with past medical history of CVA with left-sided deficits presenting to the emergency department today with left side neglect and some generalized weakness.  The patient seems to be relatively close to his baseline neurologically.  Given his baseline exam I did call and discussed this with Dr. Vanessa.  Did review his noncontrast head CT which was already ordered at triage.  Will hold off on a code stroke as the patient is already on Eliquis  and is not a good thrombectomy candidate but did recommend the CT angiogram and will make him a code medical  to get this done soon as possible.  Will obtain viral testing as well as a chest x-ray, basic labs, and urinalysis to eval for underlying infectious etiologies that may be causing exacerbations of his symptoms.  Amount and/or Complexity of Data Reviewed Radiology: ordered.   ***  {Document critical care time when appropriate  Document review of labs and clinical decision tools ie CHADS2VASC2, etc  Document your independent review of radiology images and any outside records  Document your discussion with family members, caretakers and with consultants  Document social determinants of health affecting pt's care  Document your decision making why or why not admission, treatments were needed:32947:::1}   Final diagnoses:  None    ED Discharge Orders     None

## 2024-02-23 NOTE — ED Notes (Signed)
 Pt failed swallow screen due to inability to continue drinking, pt states I think ill get strangled if I do, when asked to drink water without stopping. MD notified.

## 2024-02-23 NOTE — Assessment & Plan Note (Signed)
 Continue Eliquis  5 mg p.o. twice daily continue Cardizem  120 mg daily

## 2024-02-23 NOTE — ED Notes (Signed)
 Patient transported to MRI

## 2024-02-23 NOTE — ED Notes (Signed)
 Attempted to call report x3, Unable to reach IP RN. Handoff marked complete, charge RN called and notified transport called.

## 2024-02-23 NOTE — Assessment & Plan Note (Signed)
 Patient already on Eliquis  will continue continue Lipitor  MRI of the brain pending PT OT evaluation patient may need placement and rehab Will need swallow evaluation patient with history of dysphagia

## 2024-02-23 NOTE — ED Triage Notes (Signed)
 Pt BIB [facility] from [facility] A&O X1 baseline. Significant deficits from previous stroke. Pt is More lethargic from his baseline and speech is more slurred per daughter. Last seen normal 0700 am fully at baseline.   BP 118/70 HR 68  SPO2 95%  RR 16

## 2024-02-23 NOTE — Assessment & Plan Note (Signed)
 Allow permissive hypertension

## 2024-02-23 NOTE — Assessment & Plan Note (Signed)
-  will monitor on tele avoid QT prolonging medications, rehydrate correct electrolytes ? ?

## 2024-02-23 NOTE — Assessment & Plan Note (Addendum)
 Cause unclear patient with known history of CVA on Eliquis  and aspirin  per neurology is already on maximal therapy MRI brain unable to obtain due to to kyphosis May need to repeat CT in the morning Patient have had frequent repeat strokes despite maximal therapy  - Will rehydrate   - Evaluate for any possible underlining infection  UA and chest x-ray so far unremarkable  - Hold contributing medications    - neurological exam appears to be similar to prior- VBG     - ammonia pending

## 2024-02-23 NOTE — Subjective & Objective (Addendum)
 Patient presents from [name] with significant more lethargy from baseline per daughter his speech has been more slurred since 7 AM he has history of stroke and at baseline alert and oriented and able to feed self On arrival blood pressure 118/70 heart rate 68 satting 95% on room air There was some question if patient this morning had some left-sided neglect has been talking less Patient at baseline has significant left-sided weakness secondary to stroke but today patient had harder time transferring which is not normal for him.  Patient history of atrial fibrillation on Eliquis  and amiodarone  Neurology has been consulted Patient felt not to be a good candidate for intervention he is already on maximal therapy such as Eliquis  CT head nonacute showed remote posterior right MCA CTA head similar to prior proximal right M2 MCA branch occlusion with distal reconstitution similar severe proximal right vertebral artery stenosis and moderate left P2 PCA stenosis

## 2024-02-23 NOTE — Assessment & Plan Note (Signed)
 Resume Eliquis  once assessed by SLP if patient unable to tolerate diet will need to have discussion regarding long-term goals

## 2024-02-23 NOTE — Assessment & Plan Note (Signed)
 Once able to tolerate p.o. would restart Eliquis  and aspirin  SLP consult in the morning patient unable to pass swallow eval tonight

## 2024-02-23 NOTE — Plan of Care (Signed)

## 2024-02-23 NOTE — H&P (Signed)
 Edward Maynard FMW:[id number] DOB: 07-17-1933 DOA: 02/23/2024     PCP: Sherre Rea, FNP    Patient arrived to ER on 02/23/24 at 1344 Referred by Attending Ula Prentice SAUNDERS, MD   Patient coming from:    From facility   [name]    Chief Complaint:   Chief Complaint  Patient presents with   Altered Mental Status    HPI: Edward Maynard is a 89 y.o. male with medical history significant of A-fib on Eliquis , HDL, HTN, history of CVA with residual left side weakness, NSVT, CKD, mild dementia, depression, and anxiety     Presented with encephalopathy left-sided neglect Patient presents from [name] with significant more lethargy from baseline per daughter his speech has been more slurred since 7 AM he has history of stroke and at baseline alert and oriented and able to feed self On arrival blood pressure 118/70 heart rate 68 satting 95% on room air There was some question if patient this morning had some left-sided neglect has been talking less Patient at baseline has significant left-sided weakness secondary to stroke but today patient had harder time transferring which is not normal for him.  Patient history of atrial fibrillation on Eliquis  and amiodarone  Neurology has been consulted Patient felt not to be a good candidate for intervention he is already on maximal therapy such as Eliquis  CT head nonacute showed remote posterior right MCA CTA head similar to prior proximal right M2 MCA branch occlusion with distal reconstitution similar severe proximal right vertebral artery stenosis and moderate left P2 PCA stenosis     He did have recent cough there has been some concern that patient may have issues with aspiration in October SLP.  Some delayed cough but no overt signs or symptoms of aspiration.  Continue dysphagia 3 diet    Had very  similar admit in October 12-14 with repeat MRi showing new CVA  right frontal lobe and evolving subacute infarct of the left posterior  frontal lobe.  Neurology recommending to continue on Eliquis  and adding baby aspirin  indefinitely   Prior to that in the beginning of October presenting with difficulty speaking and right-sided facial droop found to have acute left precentral gyrus and acute lacunar infarct of the right cerebral hemisphere. Previously had been switched from Xarelto  to Eliquis  after having stroke back in February 2024. Neurology consulted and recommended switching to Pradaxa but not covered by insurance for which patient was continued on Eliquis .   HE was also treated for PNA at that time  Denies significant ETOH intake   Does not smoke     Regarding pertinent Chronic problems:     Hyperlipidemia -  on statins Lipitor  (atorvastatin )  Lipid Panel     Component Value Date/Time   CHOL 127 12/23/2023 0413   TRIG 29 12/23/2023 0413   HDL 43 12/23/2023 0413   CHOLHDL 3.0 12/23/2023 0413   VLDL 6 12/23/2023 0413   LDLCALC 78 12/23/2023 0413     HTN on diltiazem     last echo  Recent Results (from the past 56199 hours)  ECHOCARDIOGRAM COMPLETE   Collection Time: 12/23/23 10:48 AM  Result Value   Weight 3,682.56   Height 72   BP 138/72   S' Lateral 3.80   AR max vel 1.09   AV Area VTI 1.08   AV Mean grad 24.0   AV Peak grad 40.2   Ao pk vel 3.17   P 1/2 time 278  MV M vel 6.01   AV Area mean vel 1.03   MV Peak grad 144.5   Est EF 60 - 65%   Narrative      ECHOCARDIOGRAM REPORT       1. Left ventricular ejection fraction, by estimation, is 60 to 65%. The left ventricle has normal function. The left ventricle has no regional wall motion abnormalities. Left ventricular diastolic parameters were normal.  2. Right ventricular systolic function is normal. The right ventricular size is normal.  3. The mitral valve is normal in structure. Trivial mitral valve regurgitation. No evidence of mitral stenosis.  4. DVI 0.31. The aortic valve is normal in structure. Aortic valve regurgitation is mild.  Moderate aortic valve stenosis. Aortic valve area, by VTI measures 1.08 cm. Aortic valve mean gradient measures 24.0 mmHg. Aortic valve Vmax measures 3.17 m/s.  5. The inferior vena cava not well visualized.             Hx of CVA - with  residual deficits on Eliquis  and asprin   A. Fib -   atrial fibrillation CHA2DS2 vas score   5         current  on anticoagulation with Eliquis ,           -  Rate control:  Currently controlled with  Diltiazem ,          - Rhythm control:   amiodarone ,       CKD stage IIIa baseline Cr 1.3 CrCl cannot be calculated (Unknown ideal weight.).  Lab Results  Component Value Date   CREATININE 1.21 02/23/2024   CREATININE 1.27 (H) 12/31/2023   CREATININE 1.29 (H) 12/30/2023   Lab Results  Component Value Date   NA 140 02/23/2024   CL 105 02/23/2024   K 3.8 02/23/2024   CO2 27 02/23/2024   BUN 12 02/23/2024   CREATININE 1.21 02/23/2024   GFRNONAA 58 (L) 02/23/2024   CALCIUM  9.2 02/23/2024   PHOS 2.4 (L) 05/11/2022   ALBUMIN 3.6 02/23/2024   GLUCOSE 95 02/23/2024      Dementia -     While in ER:   CT showing no new CVA Neurology consulted Patient not a candidate for intervention at this time already on most aggressive management with Eliquis   Lab Orders         Resp panel by RT-PCR (RSV, Flu A&B, Covid) Anterior Nasal Swab         Urinalysis, Routine w reflex microscopic -Urine, Clean Catch         Comprehensive metabolic panel         CBC         POC CBG, ED      CT HEAD   NON acute  CTA head no changes MRI brain  attempted but  unable to tolerate due to kyphosis  CXR -  NON acute   Following Medications were ordered in ER: Medications  iohexol  (OMNIPAQUE ) 350 MG/ML injection 75 mL (75 mLs Intravenous Contrast Given 02/23/24 1901)    _______________________________________________________ ER Provider Called:    Neurology    Dr. Nanette They Recommend  Will hold off on a code stroke as the patient is already on Eliquis   and is not a good thrombectomy candidate but did recommend the CT angiogram and will make him a code medical to get this done soon as possible.     ED Triage Vitals  Encounter Vitals Group     BP 02/23/24 1410 125/64  Girls Systolic BP Percentile --      Girls Diastolic BP Percentile --      Boys Systolic BP Percentile --      Boys Diastolic BP Percentile --      Pulse Rate 02/23/24 1409 61     Resp 02/23/24 1409 (!) 8     Temp 02/23/24 1410 97.6 F (36.4 C)     Temp Source 02/23/24 1410 Oral     SpO2 02/23/24 1409 96 %     Weight --      Height --      Head Circumference --      Peak Flow --      Pain Score --      Pain Loc --      Pain Education --      Exclude from Growth Chart --   UFJK(75)@     _________________________________________ Significant initial  Findings: Abnormal Labs Reviewed  URINALYSIS, ROUTINE W REFLEX MICROSCOPIC - Abnormal; Notable for the following components:      Result Value   Protein, ur 30 (*)    All other components within normal limits  COMPREHENSIVE METABOLIC PANEL WITH GFR - Abnormal; Notable for the following components:   Alkaline Phosphatase 128 (*)    GFR, Estimated 58 (*)    All other components within normal limits      ECG: Ordered Personally reviewed and interpreted by me showing: HR : 58 Rhythm: Sinus or ectopic atrial rhythm Prolonged PR interval Nonspecific T abnormalities, lateral leads Prolonged QT interva QTC 570  BNP (last 3 results) Recent Labs    12/22/23 1752  BNP 178.5*     The recent clinical data is shown below. Vitals:   02/23/24 1930 02/23/24 1945 02/23/24 1958 02/23/24 2015  BP: 136/82 (!) 149/77  (!) 149/67  Pulse: 70 73  69  Resp: 15 20  15   Temp:      TempSrc:      SpO2: 94% 93% 95% 92%  WBC     Component Value Date/Time   WBC 8.7 02/23/2024 1354   LYMPHSABS 1.9 12/30/2023 0748   MONOABS 1.5 (H) 12/30/2023 0748   EOSABS 0.3 12/30/2023 0748   BASOSABS 0.1 12/30/2023 0748  Procalcitonin    Ordered      UA  no evidence of UTI   Urine analysis:    Component Value Date/Time   COLORURINE YELLOW 02/23/2024 1705   APPEARANCEUR CLEAR 02/23/2024 1705   LABSPEC 1.014 02/23/2024 1705   PHURINE 6.0 02/23/2024 1705   GLUCOSEU NEGATIVE 02/23/2024 1705   HGBUR NEGATIVE 02/23/2024 1705   BILIRUBINUR NEGATIVE 02/23/2024 1705   KETONESUR NEGATIVE 02/23/2024 1705   PROTEINUR 30 (A) 02/23/2024 1705   NITRITE NEGATIVE 02/23/2024 1705   LEUKOCYTESUR NEGATIVE 02/23/2024 1705    Results for orders placed or performed during the hospital encounter of 02/23/24  Resp panel by RT-PCR (RSV, Flu A&B, Covid) Anterior Nasal Swab     Status: None   Collection Time: 02/23/24  4:07 PM   Specimen: Anterior Nasal Swab  Result Value Ref Range Status   SARS Coronavirus 2 by RT PCR NEGATIVE NEGATIVE Final   Influenza A by PCR NEGATIVE NEGATIVE Final   Influenza B by PCR NEGATIVE NEGATIVE Final        Resp Syncytial Virus by PCR NEGATIVE NEGATIVE Final         ________  __________________________________________________________ Recent Labs  Lab 02/23/24 1354  NA 140  K 3.8  CO2 27  GLUCOSE 95  BUN 12  CREATININE 1.21  CALCIUM  9.2    Cr stable,   Lab Results  Component Value Date   CREATININE 1.21 02/23/2024   CREATININE 1.27 (H) 12/31/2023   CREATININE 1.29 (H) 12/30/2023    Recent Labs  Lab 02/23/24 1354  AST 20  ALT 28  ALKPHOS 128*  BILITOT 0.7  PROT 7.0  ALBUMIN 3.6   Lab Results  Component Value Date   CALCIUM  9.2 02/23/2024   PHOS 2.4 (L) 05/11/2022  Plt: Lab Results  Component Value Date   PLT 162 02/23/2024       Recent Labs  Lab 02/23/24 1354  WBC 8.7  HGB 14.5  HCT 43.5  MCV 93.3  PLT 162    HG/HCT  stable,       Component Value Date/Time   HGB 14.5 02/23/2024 1354   HGB 15.4 09/12/2021 1539   HCT 43.5 02/23/2024 1354   HCT 45.0 09/12/2021 1539   MCV 93.3 02/23/2024 1354   MCV 97 09/12/2021 1539        _______________________________________________ Hospitalist was called for admission for   Acute encephalopathy    The following Work up has been ordered so far:  Orders Placed This Encounter  Procedures   Resp panel by RT-PCR (RSV, Flu A&B, Covid) Anterior Nasal Swab   CT Head Wo Contrast   CT ANGIO HEAD NECK W WO CM   DG Chest Portable 1 View   MR BRAIN WO CONTRAST   Urinalysis, Routine w reflex microscopic -Urine, Clean Catch   Comprehensive metabolic panel   CBC   Consult for Unassigned Medical Admission   POC CBG, ED   EKG 12-Lead     OTHER Significant initial  Findings:  labs showing:     DM  labs:  HbA1C: Recent Labs    12/23/23 0413  HGBA1C 5.6       CBG (last 3)  Recent Labs    02/23/24 1428  GLUCAP 99          Cultures: No results found for: SDES, SPECREQUEST, CULT, REPTSTATUS   Radiological Exams on Admission: CT ANGIO HEAD NECK W WO CM Result Date: 02/23/2024 EXAM: CTA HEAD AND NECK WITH AND WITHOUT 02/23/2024 07:00:49 PM TECHNIQUE: CTA of the head and neck was performed with and without the administration of intravenous contrast. Multiplanar 2D and/or 3D reformatted images are provided for review. Automated exposure control, iterative reconstruction, and/or weight based adjustment of the mA/kV was utilized to reduce the radiation dose to as low as reasonably achievable. Stenosis of the internal carotid arteries measured using NASCET criteria. COMPARISON: CT Head today CLINICAL HISTORY: Stroke, follow up FINDINGS: AORTIC ARCH AND ARCH VESSELS: No dissection or arterial injury. No significant stenosis of the brachiocephalic or subclavian arteries. CERVICAL CAROTID ARTERIES: No dissection, arterial injury, or hemodynamically significant stenosis by NASCET criteria. CERVICAL VERTEBRAL ARTERIES: Patent. Similar severe proximal right vertebral artery stenosis. LUNGS AND MEDIASTINUM: Unremarkable. SOFT TISSUES: No acute abnormality. BONES: No acute  abnormality. ANTERIOR CIRCULATION: No significant stenosis of the internal carotid arteries. No significant stenosis of the anterior cerebral arteries. Similar proximal right M2 MCA branch occlusion with distal reconstitution. No aneurysm. POSTERIOR CIRCULATION: Patent PCAs.  Moderate left P2 PCA stenosis. No significant stenosis of the basilar artery. No significant stenosis of the vertebral arteries. No aneurysm. OTHER: No dural venous sinus thrombosis on this non-dedicated study. IMPRESSION: 1. Similar proximal right M2 MCA branch occlusion with distal reconstitution. 2. Similar severe proximal right vertebral  artery stenosis. 3. Similar moderate left P2 PCA stenosis. Electronically signed by: Gilmore Molt MD 02/23/2024 07:14 PM EST RP Workstation: [id number]   DG Chest Portable 1 View Result Date: 02/23/2024 EXAM: 1 VIEW(S) XRAY OF THE CHEST 02/23/2024 03:53:00 PM COMPARISON: 12/30/2023 CLINICAL HISTORY: AMS FINDINGS: LUNGS AND PLEURA: Elevated right hemidiaphragm is noted. No focal pulmonary opacity. No pleural effusion. No pneumothorax. HEART AND MEDIASTINUM: No acute abnormality of the cardiac and mediastinal silhouettes. BONES AND SOFT TISSUES: No acute osseous abnormality. IMPRESSION: 1. No acute cardiopulmonary findings. Electronically signed by: Lynwood Seip MD 02/23/2024 04:13 PM EST RP Workstation: [id number]   CT Head Wo Contrast Result Date: 02/23/2024 EXAM: CT HEAD WITHOUT CONTRAST 02/23/2024 03:12:44 PM TECHNIQUE: CT of the head was performed without the administration of intravenous contrast. Automated exposure control, iterative reconstruction, and/or weight based adjustment of the mA/kV was utilized to reduce the radiation dose to as low as reasonably achievable. COMPARISON: CT head without contrast 12/30/2023. CLINICAL HISTORY: Delirium. FINDINGS: LIMITATIONS/ARTIFACTS: The study is moderately degraded by patient motion. BRAIN AND VENTRICLES: No acute hemorrhage. No evidence of acute  infarct. Remote posterior right MCA territory infarct is again noted. Remote lacunar infarcts are present within the cerebellum bilaterally. Moderate white matter changes or similar to prior study. Atherosclerotic calcifications are present in the cavernous carotid arteries bilaterally and at the dural margin of both vertebral arteries. No hyperdense vessel is present. No hydrocephalus. No extra-axial collection. No mass effect or midline shift. ORBITS: No acute abnormality. SINUSES: No acute abnormality. SOFT TISSUES AND SKULL: No acute soft tissue abnormality. No skull fracture. IMPRESSION: 1. No acute intracranial abnormality. 2. Remote posterior right MCA territory infarct and remote bilateral cerebellar lacunar infarcts. 3. Moderate white matter changes, similar to prior study. 4. Atherosclerotic calcifications in the cavernous carotid arteries bilaterally and at the dural margin of both vertebral arteries. No hyperdense vessel is present. Electronically signed by: Lonni Necessary MD 02/23/2024 03:32 PM EST RP Workstation: [id number]   _______________________________________________________________________________________________________ Latest  Blood pressure (!) 149/67, pulse 69, temperature 98.9 F (37.2 C), temperature source Oral, resp. rate 15, SpO2 92%.   Vitals  labs and radiology finding personally reviewed  Review of Systems:    Pertinent positives include:  confusion  Constitutional:  No weight loss, night sweats, Fevers, chills, fatigue, weight loss  HEENT:  No headaches, Difficulty swallowing,Tooth/dental problems,Sore throat,  No sneezing, itching, ear ache, nasal congestion, post nasal drip,  Cardio-vascular:  No chest pain, Orthopnea, PND, anasarca, dizziness, palpitations.no Bilateral lower extremity swelling  GI:  No heartburn, indigestion, abdominal pain, nausea, vomiting, diarrhea, change in bowel habits, loss of appetite, melena, blood in stool, hematemesis Resp:   no shortness of breath at rest. No dyspnea on exertion, No excess mucus, no productive cough, No non-productive cough, No coughing up of blood.No change in color of mucus.No wheezing. Skin:  no rash or lesions. No jaundice GU:  no dysuria, change in color of urine, no urgency or frequency. No straining to urinate.  No flank pain.  Musculoskeletal:  No joint pain or no joint swelling. No decreased range of motion. No back pain.  Psych:  No change in mood or affect. No depression or anxiety. No memory loss.  Neuro: no localizing neurological complaints, no tingling, no weakness, no double vision, no gait abnormality, no slurred speech, no   All systems reviewed and apart from HOPI all are negative _______________________________________________________________________________________________ Past Medical History:   Past Medical History:  Diagnosis Date   High cholesterol  05/2013; no defecits   Hyperlipemia    Left arm weakness    Left hand weakness    Personal history of colonic polyp - adenoma  10/23/2013   Stroke (HCC) 05/2013   MRI on 07/02/13 = Multifocal acute & subacute infarction involving right frontal MCA/ACA & left parietal MCA/PCA watershed areas      Past Surgical History:  Procedure Laterality Date   IR CT HEAD LTD  05/10/2022   IR PERCUTANEOUS ART THROMBECTOMY/INFUSION INTRACRANIAL INC DIAG ANGIO  05/10/2022   LOOP RECORDER IMPLANT N/A 10/31/2013   Procedure: LOOP RECORDER IMPLANT;  Surgeon: Lynwood JONETTA Rakers, MD;  Location: [facility] CATH [facility];  Service: Cardiovascular;  Laterality: N/A;   RADIOLOGY WITH ANESTHESIA N/A 05/10/2022   Procedure: IR WITH ANESTHESIA;  Surgeon: Dolphus Carrion, MD;  Location: [facility] OR;  Service: Radiology;  Laterality: N/A;   SHOULDER SURGERY Bilateral 2003, 1995    Social History:  Ambulatory  wheelchair bound,     reports that he quit smoking about 53 years ago. His smoking use included cigarettes. He has never used smokeless tobacco. He reports  that he does not drink alcohol  and does not use drugs.     Family History:   Family History  Problem Relation Age of Onset   Hypertension Mother    Hypertension Father    Stroke Father    COPD Brother    Stroke Brother    Colon cancer Neg Hx    Heart attack Neg Hx    ______________________________________________________________________________________________ Allergies: No Known Allergies   Prior to Admission medications   Medication Sig Start Date End Date Taking? Authorizing Provider  acetaminophen  (TYLENOL ) 325 MG tablet Take 2 tablets (650 mg total) by mouth every 4 (four) hours as needed for mild pain (or temp > 37.5 C (99.5 F)). 06/07/22   Angiulli, Toribio PARAS, PA-C  amiodarone  (PACERONE ) 200 MG tablet Take 100 mg by mouth daily. 12/25/23   [provider]  apixaban  (ELIQUIS ) 5 MG TABS tablet Take 1 tablet (5 mg total) by mouth 2 (two) times daily. 05/18/22   Waddell Karna LABOR, NP  aspirin  EC 81 MG tablet Take 1 tablet (81 mg total) by mouth daily. Swallow whole. 12/31/23 12/30/24  [name], MD  atorvastatin  (LIPITOR ) 80 MG tablet Take 1 tablet (80 mg total) by mouth daily. 12/26/23   Rojelio Nest, DO  Cholecalciferol 25 MCG (1000 UT) capsule Take 1,000 Units by mouth daily.    [provider]  diltiazem  (CARDIZEM  CD) 120 MG 24 hr capsule Take 1 capsule (120 mg total) by mouth daily. 12/26/23   Rojelio Nest, DO  docusate sodium  (COLACE) 100 MG capsule Take 1 capsule (100 mg total) by mouth daily as needed for mild constipation. 11/03/22   Darlean Maus, NP  Emollient (AQUAPHOR OINTMENT BODY EX) Apply 1 application  topically daily. To bilateral extremities    [provider]  FLUoxetine  (PROZAC ) 10 MG capsule Take 10 mg by mouth daily.    [provider]  folic acid  (FOLVITE ) 400 MCG tablet Take 400 mcg by mouth daily.    [provider]  hydrocortisone  1 % ointment Apply 1 Application topically 3 (three) times daily as needed  for itching.    [provider]  hydrOXYzine  (ATARAX ) 25 MG tablet Take 25 mg by mouth every 6 (six) hours as needed for anxiety or itching.    [provider]  mirtazapine  (REMERON ) 7.5 MG tablet Take 7.5 mg by mouth at bedtime.  [provider]  traZODone  (DESYREL ) 50 MG tablet Take 50 mg by mouth at bedtime.    [provider]  triamcinolone  cream (KENALOG ) 0.1 % Apply 1 Application topically 2 (two) times daily as needed (rash). Affected areas of leg    [provider]    ___________________________________________________________________________________________________ Physical Exam:    02/23/2024    8:15 PM 02/23/2024    7:45 PM 02/23/2024    7:30 PM  Vitals with BMI  Systolic 149 149 863  Diastolic 67 77 82  Pulse 69 73 70     1. General:  in No  Acute distress   Chronically ill  -appearing 2. Psychological: Alert and   Oriented x3 3. Head/ENT:    Dry Mucous Membranes                          Head Non traumatic, neck supple                          Poor Dentition 4. SKIN:  decreased Skin turgor,  Skin clean Dry and intact no rash    5. Heart: Regular rate and rhythm no  Murmur, no Rub or gallop 6. Lungs:  no wheezes or crackles   7. Abdomen: Soft,  non-tender, Non distended   obese  bowel sounds present 8. Lower extremities: no clubbing, cyanosis, no  edema 9. Neurologically decree strength on the left, which is chronic patient seems to have harder time turning his neck to the right secondary to limitation in neck movement which appears to be musculoskeletal but he is able to follow all gaze bilaterally to the left and right, chronic right facial droop 10. MSK: Normal range of motion    Chart has been reviewed  ______________________________________________________________________________________________  Assessment/Plan 89 y.o. male with medical history significant of A-fib on Eliquis , HDL, HTN, history of CVA with  residual left side weakness, NSVT, CKD, mild dementia, depression, and anxiety     Admitted for  Acute encephalopathy    Present on Admission:  Prolonged QT interval  Acute encephalopathy  (Resolved) Acute encephalopathy  Chronic a-fib (HCC)  Essential hypertension     Prolonged QT interval - will monitor on tele avoid QT prolonging medications, rehydrate correct electrolytes   Acute encephalopathy    Cause unclear patient with known history of CVA on Eliquis  per neurology is already on maximal therapy MRI brain pending  - Will rehydrate   - Evaluate for any possible underlining infection  UA and chest x-ray so far unremarkable  - Hold contributing medications    - neurological exam appears to be nonfocal but patient unable to cooperate fully   - VBG     - ammonia pending   Chronic a-fib (HCC) Continue Eliquis  5 mg p.o. twice daily continue Cardizem  120 mg daily  Essential hypertension Allow permissive hypertension  History of stroke Patient already on Eliquis  will continue continue Lipitor  MRI of the brain pending PT OT evaluation patient may need placement and rehab Will need swallow evaluation patient with history of dysphagia    Other plan as per orders.  DVT prophylaxis:  SCD      Code Status:    DNR/DNI   as per paperwork   ACP   has been reviewed     Family Communication:   Family not at  Bedside    Diet npo   Disposition Plan:  Back to current facility when stable                             ***Following barriers for discharge:                             Chest pain *** Stroke *** Syncope ***work up is complete                            Electrolytes corrected                               Anemia corrected h/H stable                             Pain controlled with PO medications                               Afebrile, white count improving able to transition to PO antibiotics                             Will need to be able to tolerate  PO                            Will likely need home health, home O2, set up                           Will need consultants to evaluate patient prior to discharge                           Work of breathing improves                        ***Would benefit from PT/OT eval prior to DC  Ordered                   Swallow eval - SLP ordered                   Diabetes care coordinator                   Transition of care consulted                   Nutrition    consulted                  Wound care  consulted                   Palliative care    consulted                   Behavioral health  consulted                    Consults called: ***  NONE   Admission status:  ED Disposition     ED Disposition  Admit   Condition  --   Comment  The patient appears reasonably stabilized for admission considering the current resources, flow, and  capabilities available in the ED at this time, and I doubt any other [facility] requiring further screening and/or treatment in the ED prior to admission is  present.           Obs***  ***  inpatient     I Expect 2 midnight stay secondary to severity of patient's current illness need for inpatient interventions justified by the following: ***hemodynamic instability despite optimal treatment (tachycardia *hypotension * tachypnea *hypoxia, hypercapnia) *** Severe lab/radiological/exam abnormalities including:    Acute encephalopathy ***  and extensive comorbidities including: *substance abuse  *Chronic pain *DM2  * CHF * CAD  * COPD/asthma *Morbid Obesity * CKD *dementia *liver disease *history of stroke with residual deficits *  malignancy, * sickle cell disease  History of amputation Chronic anticoagulation  That are currently affecting medical management.   I expect  patient to be hospitalized for 2 midnights requiring inpatient medical care.  Patient is at high risk for adverse outcome (such as loss of life or disability) if not  treated.  Indication for inpatient stay as follows:  Severe change from baseline regarding mental status Hemodynamic instability despite maximal medical therapy,  severe pain requiring acute inpatient management,  inability to maintain oral hydration   persistent chest pain despite medical management Need for operative/procedural  intervention New or worsening hypoxia ongoing suicidal ideations   Need for IV antibiotics, IV fluids,, IV pain medications, IV anticoagulation,  IV rate controling medications, IV antihypertensives need for biPAP Frequent labs    Level of care   *** tele  For 12H 24H     medical floor       progressive     stepdown   tele indefinitely please discontinue once patient no longer qualifies COVID-19 Labs    Critical***  Patient is critically ill due to  hemodynamic instability * respiratory failure *severe sepsis* ongoing chest pain*  They are at high risk for life/limb threatening clinical deterioration requiring frequent reassessment and modifications of care.  Services provided include examination of the patient, review of relevant ancillary tests, prescription of lifesaving therapies, review of medications and prophylactic therapy.  Total critical care time excluding separately billable procedures: 60*  Minutes.    Dejanira Pamintuan 02/23/2024, 8:37 PM ***  [facility]     after 2 AM please page floor coverage   If 7AM-7PM, please contact the day team taking care of the patient using [email or web address]

## 2024-02-23 NOTE — Assessment & Plan Note (Signed)
 Cause unclear patient with known history of CVA on Eliquis  and aspirin  per neurology is already on maximal therapy MRI brain unable to obtain due to to kyphosis May need to repeat CT in the morning Patient have had frequent repeat strokes despite maximal therapy  - Will rehydrate   - Evaluate for any possible underlining infection  UA and chest x-ray so far unremarkable  - Hold contributing medications    - neurological exam appears to be similar to prior- [facility]     - ammonia pending

## 2024-02-23 NOTE — ED Notes (Signed)
 Spoke with Neurosurgeon, IP RN unable to take report at this time, diplomatic services operational officer to give pt RN contact info to call this RN to report.

## 2024-02-24 ENCOUNTER — Observation Stay (HOSPITAL_COMMUNITY)

## 2024-02-24 ENCOUNTER — Other Ambulatory Visit: Payer: Self-pay

## 2024-02-24 DIAGNOSIS — R414 Neurologic neglect syndrome: Secondary | ICD-10-CM | POA: Diagnosis present

## 2024-02-24 DIAGNOSIS — I779 Disorder of arteries and arterioles, unspecified: Secondary | ICD-10-CM | POA: Diagnosis not present

## 2024-02-24 DIAGNOSIS — I129 Hypertensive chronic kidney disease with stage 1 through stage 4 chronic kidney disease, or unspecified chronic kidney disease: Secondary | ICD-10-CM | POA: Diagnosis present

## 2024-02-24 DIAGNOSIS — Z87891 Personal history of nicotine dependence: Secondary | ICD-10-CM | POA: Diagnosis not present

## 2024-02-24 DIAGNOSIS — Z79899 Other long term (current) drug therapy: Secondary | ICD-10-CM | POA: Diagnosis not present

## 2024-02-24 DIAGNOSIS — Z66 Do not resuscitate: Secondary | ICD-10-CM | POA: Diagnosis present

## 2024-02-24 DIAGNOSIS — Z7982 Long term (current) use of aspirin: Secondary | ICD-10-CM | POA: Diagnosis not present

## 2024-02-24 DIAGNOSIS — I69391 Dysphagia following cerebral infarction: Secondary | ICD-10-CM | POA: Diagnosis not present

## 2024-02-24 DIAGNOSIS — G8194 Hemiplegia, unspecified affecting left nondominant side: Secondary | ICD-10-CM | POA: Diagnosis present

## 2024-02-24 DIAGNOSIS — Z8249 Family history of ischemic heart disease and other diseases of the circulatory system: Secondary | ICD-10-CM | POA: Diagnosis not present

## 2024-02-24 DIAGNOSIS — F32A Depression, unspecified: Secondary | ICD-10-CM | POA: Diagnosis present

## 2024-02-24 DIAGNOSIS — R29710 NIHSS score 10: Secondary | ICD-10-CM | POA: Diagnosis present

## 2024-02-24 DIAGNOSIS — R059 Cough, unspecified: Secondary | ICD-10-CM | POA: Diagnosis present

## 2024-02-24 DIAGNOSIS — I639 Cerebral infarction, unspecified: Secondary | ICD-10-CM | POA: Diagnosis not present

## 2024-02-24 DIAGNOSIS — I6389 Other cerebral infarction: Secondary | ICD-10-CM | POA: Diagnosis not present

## 2024-02-24 DIAGNOSIS — I4891 Unspecified atrial fibrillation: Secondary | ICD-10-CM | POA: Diagnosis not present

## 2024-02-24 DIAGNOSIS — G9349 Other encephalopathy: Secondary | ICD-10-CM | POA: Diagnosis present

## 2024-02-24 DIAGNOSIS — Z7189 Other specified counseling: Secondary | ICD-10-CM | POA: Diagnosis not present

## 2024-02-24 DIAGNOSIS — E7800 Pure hypercholesterolemia, unspecified: Secondary | ICD-10-CM | POA: Diagnosis present

## 2024-02-24 DIAGNOSIS — F03A3 Unspecified dementia, mild, with mood disturbance: Secondary | ICD-10-CM | POA: Diagnosis present

## 2024-02-24 DIAGNOSIS — R1310 Dysphagia, unspecified: Secondary | ICD-10-CM | POA: Diagnosis present

## 2024-02-24 DIAGNOSIS — F03A18 Unspecified dementia, mild, with other behavioral disturbance: Secondary | ICD-10-CM | POA: Diagnosis present

## 2024-02-24 DIAGNOSIS — F03A4 Unspecified dementia, mild, with anxiety: Secondary | ICD-10-CM | POA: Diagnosis present

## 2024-02-24 DIAGNOSIS — N182 Chronic kidney disease, stage 2 (mild): Secondary | ICD-10-CM | POA: Diagnosis present

## 2024-02-24 DIAGNOSIS — I69354 Hemiplegia and hemiparesis following cerebral infarction affecting left non-dominant side: Secondary | ICD-10-CM | POA: Diagnosis not present

## 2024-02-24 DIAGNOSIS — R569 Unspecified convulsions: Secondary | ICD-10-CM | POA: Diagnosis not present

## 2024-02-24 DIAGNOSIS — Z515 Encounter for palliative care: Secondary | ICD-10-CM | POA: Diagnosis not present

## 2024-02-24 DIAGNOSIS — I63511 Cerebral infarction due to unspecified occlusion or stenosis of right middle cerebral artery: Secondary | ICD-10-CM | POA: Diagnosis present

## 2024-02-24 DIAGNOSIS — I482 Chronic atrial fibrillation, unspecified: Secondary | ICD-10-CM | POA: Diagnosis present

## 2024-02-24 DIAGNOSIS — Z7901 Long term (current) use of anticoagulants: Secondary | ICD-10-CM | POA: Diagnosis not present

## 2024-02-24 DIAGNOSIS — Z95818 Presence of other cardiac implants and grafts: Secondary | ICD-10-CM | POA: Diagnosis not present

## 2024-02-24 DIAGNOSIS — R29810 Facial weakness: Secondary | ICD-10-CM | POA: Diagnosis present

## 2024-02-24 DIAGNOSIS — R4182 Altered mental status, unspecified: Secondary | ICD-10-CM | POA: Diagnosis not present

## 2024-02-24 DIAGNOSIS — G9340 Encephalopathy, unspecified: Secondary | ICD-10-CM | POA: Diagnosis present

## 2024-02-24 DIAGNOSIS — I6340 Cerebral infarction due to embolism of unspecified cerebral artery: Secondary | ICD-10-CM | POA: Diagnosis not present

## 2024-02-24 DIAGNOSIS — N189 Chronic kidney disease, unspecified: Secondary | ICD-10-CM | POA: Diagnosis not present

## 2024-02-24 DIAGNOSIS — I480 Paroxysmal atrial fibrillation: Secondary | ICD-10-CM | POA: Diagnosis present

## 2024-02-24 DIAGNOSIS — Z789 Other specified health status: Secondary | ICD-10-CM | POA: Diagnosis not present

## 2024-02-24 LAB — LIPID PANEL
Cholesterol: 103 mg/dL (ref 0–200)
HDL: 36 mg/dL — ABNORMAL LOW (ref >40)
LDL Cholesterol: 50 mg/dL (ref 0–99)
Total CHOL/HDL Ratio: 2.9 ratio
Triglycerides: 84 mg/dL (ref <150)
VLDL: 17 mg/dL (ref 0–40)

## 2024-02-24 LAB — COMPREHENSIVE METABOLIC PANEL WITH GFR
ALT: 24 U/L (ref 0–44)
AST: 17 U/L (ref 15–41)
Albumin: 3.3 g/dL — ABNORMAL LOW (ref 3.5–5.0)
Alkaline Phosphatase: 115 U/L (ref 38–126)
Anion gap: 10 (ref 5–15)
BUN: 11 mg/dL (ref 8–23)
CO2: 24 mmol/L (ref 22–32)
Calcium: 8.8 mg/dL — ABNORMAL LOW (ref 8.9–10.3)
Chloride: 105 mmol/L (ref 98–111)
Creatinine, Ser: 1.19 mg/dL (ref 0.61–1.24)
GFR, Estimated: 59 mL/min — ABNORMAL LOW (ref >60)
Glucose, Bld: 118 mg/dL — ABNORMAL HIGH (ref 70–99)
Potassium: 3.9 mmol/L (ref 3.5–5.1)
Sodium: 139 mmol/L (ref 135–145)
Total Bilirubin: 1 mg/dL (ref 0.0–1.2)
Total Protein: 6.2 g/dL — ABNORMAL LOW (ref 6.5–8.1)

## 2024-02-24 LAB — RESPIRATORY PANEL BY PCR

## 2024-02-24 LAB — LACTIC ACID, PLASMA: Lactic Acid, Venous: 1.1 mmol/L (ref 0.5–1.9)

## 2024-02-24 LAB — CBC
HCT: 40.4 % (ref 39.0–52.0)
Hemoglobin: 13.7 g/dL (ref 13.0–17.0)
MCH: 31.3 pg (ref 26.0–34.0)
MCHC: 33.9 g/dL (ref 30.0–36.0)
MCV: 92.2 fL (ref 80.0–100.0)
Platelets: 149 K/uL — ABNORMAL LOW (ref 150–400)
RBC: 4.38 MIL/uL (ref 4.22–5.81)
RDW: 13.6 % (ref 11.5–15.5)
WBC: 7.4 K/uL (ref 4.0–10.5)
nRBC: 0 % (ref 0.0–0.2)

## 2024-02-24 LAB — ECHOCARDIOGRAM COMPLETE
Area-P 1/2: 6.96 cm2
Height: 72.008 in
S' Lateral: 3.5 cm
Weight: 3393.32 [oz_av]

## 2024-02-24 LAB — TSH: TSH: 2.593 u[IU]/mL (ref 0.350–4.500)

## 2024-02-24 MED ORDER — GUAIFENESIN-DM 100-10 MG/5ML PO SYRP
5.0000 mL | ORAL_SOLUTION | Freq: Four times a day (QID) | ORAL | Status: DC | PRN
  Administered 2024-02-26: 5 mL via ORAL
  Filled 2024-02-24 (×2): qty 5

## 2024-02-24 MED ORDER — MIRTAZAPINE 15 MG PO TABS
7.5000 mg | ORAL_TABLET | Freq: Every day | ORAL | Status: DC
  Administered 2024-02-25 – 2024-02-27 (×3): 7.5 mg via ORAL
  Filled 2024-02-24 (×3): qty 1

## 2024-02-24 MED ORDER — FLUOXETINE HCL 20 MG PO CAPS
20.0000 mg | ORAL_CAPSULE | Freq: Every day | ORAL | Status: DC
  Administered 2024-02-25 – 2024-02-28 (×4): 20 mg via ORAL
  Filled 2024-02-24 (×5): qty 1

## 2024-02-24 MED ORDER — DILTIAZEM HCL ER COATED BEADS 120 MG PO CP24
120.0000 mg | ORAL_CAPSULE | Freq: Every day | ORAL | Status: DC
  Administered 2024-02-25 – 2024-02-28 (×4): 120 mg via ORAL
  Filled 2024-02-24 (×5): qty 1

## 2024-02-24 MED ORDER — TRAZODONE HCL 50 MG PO TABS
100.0000 mg | ORAL_TABLET | Freq: Every day | ORAL | Status: DC
  Administered 2024-02-25 – 2024-02-27 (×3): 100 mg via ORAL
  Filled 2024-02-24: qty 1
  Filled 2024-02-24: qty 2
  Filled 2024-02-24: qty 1

## 2024-02-24 MED ORDER — APIXABAN 5 MG PO TABS
5.0000 mg | ORAL_TABLET | Freq: Two times a day (BID) | ORAL | Status: DC
  Administered 2024-02-25 – 2024-02-28 (×7): 5 mg via ORAL
  Filled 2024-02-24 (×8): qty 1

## 2024-02-24 MED ORDER — AMIODARONE HCL 200 MG PO TABS
100.0000 mg | ORAL_TABLET | Freq: Every day | ORAL | Status: DC
  Administered 2024-02-25 – 2024-02-28 (×4): 100 mg via ORAL
  Filled 2024-02-24 (×5): qty 1

## 2024-02-24 MED ORDER — ASPIRIN 81 MG PO TBEC
81.0000 mg | DELAYED_RELEASE_TABLET | Freq: Every day | ORAL | Status: DC
  Administered 2024-02-25 – 2024-02-26 (×2): 81 mg via ORAL
  Filled 2024-02-24 (×3): qty 1

## 2024-02-24 MED ORDER — ATORVASTATIN CALCIUM 80 MG PO TABS
80.0000 mg | ORAL_TABLET | Freq: Every day | ORAL | Status: DC
  Administered 2024-02-25 – 2024-02-26 (×2): 80 mg via ORAL
  Filled 2024-02-24 (×3): qty 1

## 2024-02-24 NOTE — Progress Notes (Signed)
 SLP Cancellation Note  Patient Details Name: Edward Maynard MRN: [id number] DOB: 05-08-1933   Cancelled treatment:       Reason Eval/Treat Not Completed: Patient at procedure or test/unavailable.  Pt is being transported to MRI at this time.  SLP will f/u as schedule allows.    [name] 02/24/2024, 8:56 AM

## 2024-02-24 NOTE — Care Management Obs Status (Signed)
 MEDICARE OBSERVATION STATUS NOTIFICATION   Patient Details  Name: Edward Maynard MRN: [id number] Date of Birth: Aug 10, 1933   Medicare Observation Status Notification Given:  Yes    Jeania Nater G., RN 02/24/2024, 9:27 AM

## 2024-02-24 NOTE — Consult Note (Signed)
 NEUROLOGY CONSULT NOTE   Date of service: February 24, 2024 Patient Name: Edward Maynard MRN:  [id number] DOB:  1933/07/12 Chief Complaint: worsening baseline lethargy and slurred speech Requesting Provider: Arlice Reichert, MD  History of Present Illness  ICHOLAS Maynard is a 89 y.o. male with hx of A-fib on Eliquis  as well as aspirin , HDL, HTN, history of CVA with residual left side weakness, NSVT, CKD, mild dementia, depression, and anxiety who is brought in from his facility with worsening lethargy and slurred speech.  MRI brain today shows an acute appearing R posterior frontal stroke in addition to chronic R MCA stroke. Patient continues to have infarcts despite being on eliquis  and aspirin . He was evaluated by us  on Dec 30, 2023 and Oct 5th, 2025 and imaging continues to show new embolic appearing strokes.  LKW: unclear Modified rankin score: 3-Moderate disability-requires help but walks WITHOUT assistance IV Thrombolysis: not offered, unclear LKW and patient on eliquis  EVT: not offered, unclear LKW  NIHSS components Score: Comment  1a Level of Conscious 0[x]  1[]  2[]  3[]      1b LOC Questions 0[x]  1[]  2[]       1c LOC Commands 0[x]  1[]  2[]       2 Best Gaze 0[]  1[x]  2[]       3 Visual 0[]  1[]  2[x]  3[]      4 Facial Palsy 0[]  1[x]  2[]  3[]      5a Motor Arm - left 0[]  1[]  2[]  3[x]  4[]  UN[]    5b Motor Arm - Right 0[x]  1[]  2[]  3[]  4[]  UN[]    6a Motor Leg - Left 0[x]  1[]  2[]  3[]  4[]  UN[]    6b Motor Leg - Right 0[x]  1[]  2[]  3[]  4[]  UN[]    7 Limb Ataxia 0[x]  1[]  2[]  UN[]      8 Sensory 0[]  1[]  2[x]  UN[]      9 Best Language 0[x]  1[]  2[]  3[]      10 Dysarthria 0[]  1[x]  2[]  UN[]      11 Extinct. and Inattention 0[x]  1[]  2[]       TOTAL: 10      ROS  Unable to ascertain due to encephalopathy mild confusion.  Past History   Past Medical History:  Diagnosis Date   High cholesterol    05/2013; no defecits   Hyperlipemia    Left arm weakness    Left hand weakness    Personal history of  colonic polyp - adenoma  10/23/2013   Stroke (HCC) 05/2013   MRI on 07/02/13 = Multifocal acute & subacute infarction involving right frontal MCA/ACA & left parietal MCA/PCA watershed areas    Past Surgical History:  Procedure Laterality Date   IR CT HEAD LTD  05/10/2022   IR PERCUTANEOUS ART THROMBECTOMY/INFUSION INTRACRANIAL INC DIAG ANGIO  05/10/2022   LOOP RECORDER IMPLANT N/A 10/31/2013   Procedure: LOOP RECORDER IMPLANT;  Surgeon: Lynwood JONETTA Rakers, MD;  Location: [facility]ATH [facility];  Service: Cardiovascular;  Laterality: N/A;   RADIOLOGY WITH ANESTHESIA N/A 05/10/2022   Procedure: IR WITH ANESTHESIA;  Surgeon: Dolphus Carrion, MD;  Location: [facility] OR;  Service: Radiology;  Laterality: N/A;   SHOULDER SURGERY Bilateral 2003, 1995    Family History: Family History  Problem Relation Age of Onset   Hypertension Mother    Hypertension Father    Stroke Father    COPD Brother    Stroke Brother    Colon cancer Neg Hx    Heart attack Neg Hx     Social History  reports that he quit smoking about 53 years  ago. His smoking use included cigarettes. He has never used smokeless tobacco. He reports that he does not drink alcohol  and does not use drugs.  No Known Allergies  Medications   Current Facility-Administered Medications:    0.9 %  sodium chloride  infusion, , Intravenous, Continuous, Doutova, Anastassia, MD, Last Rate: 75 mL/hr at 02/24/24 1426, Infusion Verify at 02/24/24 1426   acetaminophen  (TYLENOL ) tablet 650 mg, 650 mg, Oral, Q4H PRN **OR** acetaminophen  (TYLENOL ) 160 MG/5ML solution 650 mg, 650 mg, Per Tube, Q4H PRN **OR** acetaminophen  (TYLENOL ) suppository 650 mg, 650 mg, Rectal, Q4H PRN, Doutova, Anastassia, MD   amiodarone  (PACERONE ) tablet 100 mg, 100 mg, Oral, Daily, Dahal, Binaya, MD   apixaban  (ELIQUIS ) tablet 5 mg, 5 mg, Oral, BID, Dahal, Binaya, MD   aspirin  EC tablet 81 mg, 81 mg, Oral, Daily, Dahal, Binaya, MD   atorvastatin  (LIPITOR ) tablet 80 mg, 80 mg, Oral, Daily, Dahal,  Binaya, MD   diltiazem  (CARDIZEM  CD) 24 hr capsule 120 mg, 120 mg, Oral, Daily, Dahal, Binaya, MD   FLUoxetine  (PROZAC ) capsule 20 mg, 20 mg, Oral, Daily, Dahal, Binaya, MD   guaiFENesin -dextromethorphan  (ROBITUSSIN DM) 100-10 MG/5ML syrup 5 mL, 5 mL, Oral, Q6H PRN, Dahal, Binaya, MD   mirtazapine  (REMERON ) tablet 7.5 mg, 7.5 mg, Oral, QHS, Dahal, Binaya, MD   traZODone  (DESYREL ) tablet 100 mg, 100 mg, Oral, QHS, Dahal, Binaya, MD  Vitals   Vitals:   02/24/24 0400 02/24/24 0600 02/24/24 0850 02/24/24 1240  BP: (!) 145/77  (!) 130/56 (!) 143/73  Pulse: 69 67 70 71  Resp: 18 17 18 18   Temp: 98.1 F (36.7 C)  97.9 F (36.6 C) 98.5 F (36.9 C)  TempSrc: Oral  Oral Oral  SpO2: 97% 98% 94% 94%  Weight:      Height:        Body mass index is 28.76 kg/m.   Physical Exam   Mental status/Cognition: Alert, oriented to self, place, month and year, poor attention.  Speech/language: Dysarthric speech, fluent, comprehension intact, object naming intact, repetition intact.  Cranial nerves:   CN II Pupils equal and reactive to light, left Hemi visual field loss.   CN III,IV,VI EOM intact, slight right gaze preference.   CN V normal sensation in V1, V2, and V3 segments bilaterally    CN VII Left facial droop   CN VIII Hard of hearing.   CN IX & X normal palatal elevation, no uvular deviation    CN XI 5/5 head turn and 5/5 shoulder shrug bilaterally    CN XII midline tongue protrusion     Motor:  Muscle bulk: Normal, tone increase in left upper extremity. Mvmt Root Nerve  Muscle Right Left Comments  SA C5/6 Ax Deltoid 5 3    EF C5/6 Mc Biceps 5 2    EE C6/7/8 Rad Triceps 5 2    WF C6/7 Med FCR        WE C7/8 PIN ECU        F Ab C8/T1 U ADM/FDI 5 1    HF L1/2/3 Fem Illopsoas 5 4    KE L2/3/4 Fem Quad        DF L4/5 D Peron Tib Ant 5 5    PF S1/2 Tibial Grc/Sol 5 5      Sensation:  Light touch Decreased on the left.   Pin prick     Temperature     Vibration    Proprioception       Coordination/Complex Motor:  -  Finger to Nose unable to do with left upper extremity. - Heel to shin unable to do. - Rapid alternating movement are slowed on the left. - Gait: Deferred for patient safety.  Labs/Imaging/Neurodiagnostic studies   CBC:  Recent Labs  Lab Mar 20, 2024 1354 02/24/24 0610  WBC 8.7 7.4  HGB 14.5 13.7  HCT 43.5 40.4  MCV 93.3 92.2  PLT 162 149*   Basic Metabolic Panel:  Lab Results  Component Value Date   NA 139 02/24/2024   K 3.9 02/24/2024   CO2 24 02/24/2024   GLUCOSE 118 (H) 02/24/2024   BUN 11 02/24/2024   CREATININE 1.19 02/24/2024   CALCIUM  8.8 (L) 02/24/2024   GFRNONAA 59 (L) 02/24/2024   GFRAA 65 04/04/2018   Lipid Panel:  Lab Results  Component Value Date   LDLCALC 50 02/24/2024   HgbA1c:  Lab Results  Component Value Date   HGBA1C 5.6 12/23/2023   Urine Drug Screen:     Component Value Date/Time   LABOPIA NONE DETECTED 2024/03/20 1650   COCAINSCRNUR NONE DETECTED March 20, 2024 1650   LABBENZ NONE DETECTED 2024/03/20 1650   AMPHETMU NONE DETECTED 03/20/24 1650   THCU NONE DETECTED 03/20/24 1650   LABBARB NONE DETECTED 03/20/2024 1650    Alcohol  Level     Component Value Date/Time   ETH <15 12/22/2023 1752   INR  Lab Results  Component Value Date   INR 1.2 12/22/2023   APTT  Lab Results  Component Value Date   APTT 36 12/22/2023   AED levels: No results found for: PHENYTOIN, ZONISAMIDE, LAMOTRIGINE, LEVETIRACETA   MRI Brain(Personally reviewed): 1. New restricted diffusion in the right posterior frontal lobe, consistent with acute non-hemorrhagic infarct. 2. Chronic encephalomalacia changes at the right frontoparietal junction and right temporal lobe. 3. Advanced diffuse periventricular and deep cerebral white matter disease. 4. Wallerian degeneration of the right cerebral peduncle. 5. Encephalomalacia changes within the left posterior frontal lobe related to small focal subcortical infarct. 6.  Mild hemosiderin staining within the right MCA distribution chronic infarct.  ASSESSMENT   KAHRON KAUTH is a 89 y.o. male with hx of A-fib on Eliquis  as well as aspirin , HDL, HTN, history of CVA with residual left side weakness, NSVT, CKD, mild dementia, depression, and anxiety who is brought in from his facility with worsening lethargy and slurred speech.  MRI brain today shows an acute appearing R posterior frontal stroke in addition to chronic R MCA stroke. Patient continues to have infarcts despite being on eliquis  and aspirin . He was evaluated by us  on Dec 30, 2023 and Oct 5th, 2025 and imaging continues to show new embolic appearing strokes.  Unfortunately, with patient being on aspirin  and Eliquis , he is on maximal medical management and is unlikely to benefit from a full stroke workup.  I had extensive discussion with daughter about this. We still think that Pt and OT eval and therapy would be helpful.  RECOMMENDATIONS  - Frequent Neuro checks per stroke unit protocol - Recommend Vascular imaging with CT angio the head and neck -No need for repeat TTE as patient has a known history of A-fib. - Recommend obtaining Lipid panel with LDL - Please start statin if LDL > 70 - Recommend HbA1c to evaluate for diabetes and how well it is controlled. - continue eliquis  and aspirin . - Recommend DVT ppx - SBP goal - aim for gradual normotension. - Recommend Telemetry monitoring for arrythmia - Recommend bedside swallow screen prior to PO intake. - Stroke education booklet -  Recommend PT/OT/SLP consult  ______________________________________________________________________  Plan discussed with patient's daughter as well as Dr. Arlice with the hospitalist team.  Signed, Zohar Laing, MD [facility]

## 2024-02-24 NOTE — Progress Notes (Signed)
 EEG complete - results pending

## 2024-02-24 NOTE — Assessment & Plan Note (Signed)
-  chronic avoid nephrotoxic medications such as NSAIDs, Vanco Zosyn combo,  avoid hypotension, continue to follow renal function

## 2024-02-24 NOTE — Assessment & Plan Note (Signed)
 SLP re-eval, pt did not pass bedside swallow eval

## 2024-02-24 NOTE — Evaluation (Signed)
 Speech Language Pathology Evaluation Patient Details Name: Edward Maynard MRN: [id number] DOB: Jan 20, 1934 Today's Date: 02/24/2024 Time: 1211-1224 SLP Time Calculation (min) (ACUTE ONLY): 13 min  Problem List:  Patient Active Problem List   Diagnosis Date Noted   Cough 02/24/2024   Prolonged QT interval 02/23/2024   History of stroke 02/23/2024   Acute CVA (cerebrovascular accident) (HCC) 12/31/2023   Acute encephalopathy 12/31/2023   Hypokalemia 12/30/2023   Prediabetes 12/30/2023   Lesion of parotid gland 12/30/2023   AAA (abdominal aortic aneurysm) 12/30/2023   Acute focal neurological deficit 12/22/2023   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   Pulmonary lesion, right 12/22/2023   Anxiety and depression 12/22/2023   Shoulder impingement syndrome, right 08/10/2022   CKD (chronic kidney disease) 05/18/2022   Insomnia 05/18/2022   CVA (cerebral vascular accident) (HCC) 05/18/2022   Essential hypertension 05/16/2022   Obesity 05/16/2022   Advanced age 02/14/2023   Dysphagia 05/16/2022   History of stroke with current residual effects 05/10/2022   Middle cerebral artery embolism, 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   Chronic a-fib (HCC) 05/10/2022   Paroxysmal atrial fibrillation (HCC) 01/18/2015   Chronic anticoagulation 01/18/2015   PVC's (premature ventricular contractions) 12/14/2014   NSVT (nonsustained ventricular tachycardia) (HCC) 12/14/2014   Cerebral infarction due to embolism of cerebral artery (HCC) 07/10/2014   Hyperlipidemia 12/12/2013   History of colonic polyps 10/23/2013   Past Medical History:  Past Medical History:  Diagnosis Date   High cholesterol    05/2013; no defecits   Hyperlipemia    Left arm weakness    Left hand weakness    Personal history of colonic polyp - adenoma  10/23/2013   Stroke (HCC) 05/2013   MRI on 07/02/13 = Multifocal acute & subacute infarction involving right frontal MCA/ACA & left parietal MCA/PCA watershed areas   Past Surgical History:   Past Surgical History:  Procedure Laterality Date   IR CT HEAD LTD  05/10/2022   IR PERCUTANEOUS ART THROMBECTOMY/INFUSION INTRACRANIAL INC DIAG ANGIO  05/10/2022   LOOP RECORDER IMPLANT N/A 10/31/2013   Procedure: LOOP RECORDER IMPLANT;  Surgeon: Lynwood JONETTA Rakers, MD;  Location: [facility]ATH [facility];  Service: Cardiovascular;  Laterality: N/A;   RADIOLOGY WITH ANESTHESIA N/A 05/10/2022   Procedure: IR WITH ANESTHESIA;  Surgeon: Dolphus Carrion, MD;  Location: [facility] OR;  Service: Radiology;  Laterality: N/A;   SHOULDER SURGERY Bilateral 2003, 1995   HPI:  Edward Maynard is a 89 y.o. male who presented to the ED from [name] assisted living with complaints of altered mental status, left-sided hemineglect.  MRI remarkable for restricted diffusion in the right posterior frontal lobe, consistent  with acute non-hemorrhagic infarct. CXR negative for acute findings.  Pt was most recently seen for a clinical swallow evaluation on 10/12 with recommendations for Dysphagia 3 solids and thin liquids. PMH significant for HTN, HLD, A-fib on Eliquis , h/o CVA with residual left-sided weakness, mild dementia, anxiety, depression.   Assessment / Plan / Recommendation Clinical Impression  Pt was seen for a cognitive-linguistic evaluation.  He has baseline dementia and receives assistance with ADLs and IADLs at his assisted living facility.  Daughter and son-in-law were present to provide hx.  Pt was noted to have R side gaze preference with L neglect.  He was observed to close his right eye frequently throughout the evaluation, but was unable to state why when asked by his daughter.  Pt was able to visualize and sustain attention to SLP on his L side when cued.  Moderate dysarthria was observed with speech intelligibility impacted at the word, phrase, and sentence level.  Speech was approximately 40% intelligible to an unfamiliar listener.  Pt additionally exhibited expressive and receptive language deficits with some  difficulty noted with yes/no questions, complex command following, and confrontational naming.  Pt would benefit from additional ST and recommend full supervision and assistance with IADLs at time of discharge.    SLP Assessment  SLP Recommendation/Assessment: Patient needs continued Speech Language Pathology Services SLP Visit Diagnosis: Dysarthria and anarthria (R47.1);Cognitive communication deficit (R41.841)     Assistance Recommended at Discharge  Frequent or constant Supervision/Assistance  Functional Status Assessment Patient has had a recent decline in their functional status and demonstrates the ability to make significant improvements in function in a reasonable and predictable amount of time.  Frequency and Duration min 2x/week  2 weeks      SLP Evaluation Cognition  Overall Cognitive Status: History of cognitive impairments - at baseline Arousal/Alertness: Awake/alert Orientation Level: Oriented to person;Oriented to time;Oriented to place;Disoriented to situation Attention: Focused Focused Attention: Impaired Focused Attention Impairment: Verbal basic       Comprehension  Auditory Comprehension Overall Auditory Comprehension: Impaired Yes/No Questions: Impaired Basic Immediate Environment Questions: 25-49% accurate Commands: Impaired Conversation: Simple Interfering Components: Attention    Expression Expression Primary Mode of Expression: Verbal Verbal Expression Overall Verbal Expression: Impaired Naming: Impairment Confrontation: Impaired Interfering Components: Attention   Oral / Motor  Oral Motor/Sensory Function Overall Oral Motor/Sensory Function: Moderate impairment Facial ROM: Reduced left;Suspected CN VII (facial) dysfunction Facial Symmetry: Abnormal symmetry left;Suspected CN VII (facial) dysfunction Facial Strength: Reduced left;Suspected CN VII (facial) dysfunction Facial Sensation: Reduced left;Suspected CN V (Trigeminal) dysfunction Lingual  ROM: Reduced left;Suspected CN XII (hypoglossal) dysfunction Lingual Symmetry: Abnormal symmetry right;Suspected CN XII (hypoglossal) dysfunction Lingual Strength: Reduced;Suspected CN XII (hypoglossal) dysfunction Motor Speech Overall Motor Speech: Impaired Respiration: Within functional limits Phonation: Normal Resonance: Within functional limits Articulation: Impaired Level of Impairment: Word Intelligibility: Intelligibility reduced Word: 25-49% accurate Phrase: 0-24% accurate           Earnie Cable, M.S., CCC-SLP Acute Rehabilitation Services Office: [phone]  Earnie SQUIBB Citlaly Camplin 02/24/2024, 1:54 PM

## 2024-02-24 NOTE — Progress Notes (Signed)
 PROGRESS NOTE  Edward Maynard  DOB: [birth year]  PCP: Sherre Rea, FNP FMW:[id number]  DOA: 02/23/2024  LOS: 0 days  Hospital Day: 2  Subjective: Patient was seen and examined this morning. Pleasant elderly Caucasian male.  Propped up in bed Chart reviewed. Afebrile, hemodynamically stable, breathing on room air  Brief narrative: Edward Maynard is a 89 y.o. male with PMH significant for HTN, HLD, A-fib on Eliquis , h/o CVA with residual left-sided weakness, mild dementia, anxiety, depression. Patient lives at [facility] assisted living facility Has residual left-sided weakness from prior stroke but at baseline, patient is alert, oriented and able to feed himself. In February 2025, he had a new stroke and at the time he was switched from Xarelto  to Eliquis . Recently hospitalized twice in October, both times for new neurological symptoms. MRI brain done on 10/5 and 10/12 both showed new infarcts.  Ultimate recommendation from neurology was to continue Eliquis  and an additional aspirin  81 mg daily indefinitely.    12/6, patient presented to the ED with complaint of altered mental status, left-sided hemineglect.  His daughter noted him to more lethargic than baseline with the speech more slurred  In the ED, afebrile, hemodynamically stable, breathing on room air Seen by neurology Patient was felt not to be a good candidate for intervention and he was already on maximal therapy such as Eliquis  CT head nonacute showed remote posterior right MCA CTA head similar to prior proximal right M2 MCA branch occlusion with distal reconstitution similar severe proximal right vertebral artery stenosis and moderate left P2 PCA stenosis Admitted to [facility] MRI brain this morning showed a new new restricted diffusion in the right posterior frontal lobe, consistent with acute non-hemorrhagic infarct. Neurology consulted.  Stroke workup started  Assessment and plan: Acute nonhemorrhagic infarct of the  right posterior frontal lobe Multiple prior strokes Patient has multiple prior strokes including 2 recent strokes 2 months ago  Chronically on Eliquis  and aspirin  Brought in for altered mentation, slurred speech, worsening weakness CT head and CTA head and neck did not show any new finding MRI brain showed an acute infarct as above. Stroke workup started.  Neurology consulted Currently on Eliquis  and aspirin  Pending PT OT eval  Acute encephalopathy Other etiologies of AMS were considered as well but urinalysis unremarkable for infection, U tox negative.  Respiratory panel negative.  Ammonia level not elevated. Per H&P, by the time of admission, neurological exam was nonfocal but patient unable to cooperate fully  Current alert, awake, able to answer simple questions.  Worsening cough Ordered for SLP eval due to history of dysphagia and aspiration Antitussives added   Chronic a-fib  Continue amiodarone  100 mg daily, Cardizem  120 mg daily as before Chronically anticoagulated with Eliquis  5 mg p.o. twice daily   Essential hypertension Allow permissive hypertension  HLD Continue Lipitor   Prolonged QT interval Admission EKG with QTc 570 ms.  Repeat EKG today with QTc 440 ms Continue monitor electrolytes and avoid QTc prolonging medications    Mild dementia Anxiety/depression PTA meds- Prozac , Remeron , trazodone , Atarax  as needed, Continue   Nutrition Status:         Mobility: Pending PT eval  PT Orders: Active   PT Follow up Rec:     Goals of care   Code Status: Limited: Do not attempt resuscitation (DNR) -DNR-LIMITED -Do Not Intubate/DNI      DVT prophylaxis:  SCD's Start: 02/23/24 2255 apixaban  (ELIQUIS ) tablet 5 mg   Antimicrobials: None Fluid: None Consultants: Neurology Family  Communication: Daughter [name] at bedside  Status: Observation Level of care:  Telemetry   Patient is from: Home Needs to continue in-hospital care: Ongoing stroke  workup Anticipated d/c to: Pending clinical course, pending neurology, PT eval.  Ultimately ALF versus SNF    Diet:  Diet Order             Diet NPO time specified Except for: Ice Chips  Diet effective now                   Scheduled Meds:  amiodarone   100 mg Oral Daily   apixaban   5 mg Oral BID   aspirin  EC  81 mg Oral Daily   atorvastatin   80 mg Oral Daily   diltiazem   120 mg Oral Daily   FLUoxetine   20 mg Oral Daily   mirtazapine   7.5 mg Oral QHS   traZODone   100 mg Oral QHS    PRN meds: acetaminophen  **OR** acetaminophen  (TYLENOL ) oral liquid 160 mg/5 mL **OR** acetaminophen , guaiFENesin -dextromethorphan    Infusions:   sodium chloride  75 mL/hr at 02/24/24 1426    Antimicrobials: Anti-infectives (From admission, onward)    None       Objective: Vitals:   02/24/24 0850 02/24/24 1240  BP: (!) 130/56 (!) 143/73  Pulse: 70 71  Resp: 18 18  Temp: 97.9 F (36.6 C) 98.5 F (36.9 C)  SpO2: 94% 94%    Intake/Output Summary (Last 24 hours) at 02/24/2024 1433 Last data filed at 02/24/2024 1426 Gross per 24 hour  Intake 917.63 ml  Output 650 ml  Net 267.63 ml   Filed Weights   02/23/24 2244  Weight: 96.2 kg   Weight change:  Body mass index is 28.76 kg/m.   Physical Exam: General exam: Pleasant, elderly Caucasian male Skin: No rashes, lesions or ulcers. HEENT: Atraumatic, normocephalic, no obvious bleeding Lungs: Clear to auscultation bilaterally,  CVS: S1, S2, no murmur,   GI/Abd: Soft, nontender, nondistended, bowel sound present,   CNS: Alert, awake, facial asymmetry seen.  Alert, awake, oriented to place Psychiatry: Sad affect Extremities: No pedal edema, no calf tenderness,   Data Review: I have personally reviewed the laboratory data and studies available.  F/u labs ordered Unresulted Labs (From admission, onward)    None       Signed, Chapman Rota, MD [facility] 02/24/2024

## 2024-02-24 NOTE — Evaluation (Signed)
 Clinical/Bedside Swallow Evaluation Patient Details  Name: AMY BELLOSO MRN: [id number] Date of Birth: 11/16/1933  Today's Date: 02/24/2024 Time: SLP Start Time (ACUTE ONLY): 1155 SLP Stop Time (ACUTE ONLY): 1210 SLP Time Calculation (min) (ACUTE ONLY): 15 min  Past Medical History:  Past Medical History:  Diagnosis Date   High cholesterol    05/2013; no defecits   Hyperlipemia    Left arm weakness    Left hand weakness    Personal history of colonic polyp - adenoma  10/23/2013   Stroke (HCC) 05/2013   MRI on 07/02/13 = Multifocal acute & subacute infarction involving right frontal MCA/ACA & left parietal MCA/PCA watershed areas   Past Surgical History:  Past Surgical History:  Procedure Laterality Date   IR CT HEAD LTD  05/10/2022   IR PERCUTANEOUS ART THROMBECTOMY/INFUSION INTRACRANIAL INC DIAG ANGIO  05/10/2022   LOOP RECORDER IMPLANT N/A 10/31/2013   Procedure: LOOP RECORDER IMPLANT;  Surgeon: Lynwood JONETTA Rakers, MD;  Location: [facility] CATH LAB;  Service: Cardiovascular;  Laterality: N/A;   RADIOLOGY WITH ANESTHESIA N/A 05/10/2022   Procedure: IR WITH ANESTHESIA;  Surgeon: Dolphus Carrion, MD;  Location: [facility] OR;  Service: Radiology;  Laterality: N/A;   SHOULDER SURGERY Bilateral 2003, 1995   HPI:  ASH MCELWAIN is a 89 y.o. male who presented to the ED from [name] assisted living with complaints of altered mental status, left-sided hemineglect.  MRI remarkable for restricted diffusion in the right posterior frontal lobe, consistent  with acute non-hemorrhagic infarct. CXR negative for acute findings.  Pt was most recently seen for a clinical swallow evaluation on 10/12 with recommendations for Dysphagia 3 solids and thin liquids. PMH significant for HTN, HLD, A-fib on Eliquis , h/o CVA with residual left-sided weakness, mild dementia, anxiety, depression.    Assessment / Plan / Recommendation  Clinical Impression   Pt was seen for a clinical swallow evaluation and presents with  suspected oropharyngeal dysphagia.  He was seen with trials of ice chips, thin liquid, nectar-thick liquid, honey-thick liquid, puree, and regular solids and exhibited immediate and/or delayed coughing with all consistencies.  Pt with significant coughing following straw sip of thin liquid.  Pt's cough was notably weak and was congested throughout the remainder of the evaluation with intermittent wet vocal quality observed.  Mastication of solids was prolonged with pocketing in the L buccal cavity and moderate oral residue on the L lingual surface and dentition.  Of note, pt's son-in-law reported that his dentures were ill fitting which may have impacted mastication.  Given pt's increased aspiration risk with all consistencies, recommend continuation of NPO with allowance for small ice chips PRN for comfort given full supervision and throughout oral care prior to ice chips.  Essential oral meds may be given crushed in applesauce, otherwise recommend administering via alternative means.  Will plan for an instrumental swallow study to further evalaute swallow function.  SLP Visit Diagnosis: Dysphagia, oropharyngeal phase (R13.12)    Aspiration Risk  Moderate aspiration risk    Diet Recommendation           Other Recommendations Oral Care Recommendations: Oral care QID;Oral care prior to ice chip/H20;Staff/trained caregiver to provide oral care Caregiver Recommendations: Remove water pitcher;Have oral suction available     Swallow Evaluation Recommendations Recommendations: Ice chips PRN after oral care;NPO Oral care recommendations: Oral care QID (4x/day);Oral care before ice chips/water Caregiver Recommendations: Remove water pitcher;Have oral suction available   Assistance Recommended at Discharge    Functional Status Assessment  Patient has had a recent decline in their functional status and demonstrates the ability to make significant improvements in function in a reasonable and predictable  amount of time.  Frequency and Duration min 2x/week  2 weeks       Prognosis Prognosis for improved oropharyngeal function: Fair Barriers to Reach Goals: Cognitive deficits;Language deficits;Severity of deficits      Swallow Study   General Date of Onset: 02/24/24 HPI: LEALON VANPUTTEN is a 89 y.o. male who presented to the ED from [name] assisted living with complaints of altered mental status, left-sided hemineglect.  MRI remarkable for restricted diffusion in the right posterior frontal lobe, consistent  with acute non-hemorrhagic infarct. CXR negative for acute findings.  Pt was most recently seen for a clinical swallow evaluation on 10/12 with recommendations for Dysphagia 3 solids and thin liquids. PMH significant for HTN, HLD, A-fib on Eliquis , h/o CVA with residual left-sided weakness, mild dementia, anxiety, depression. Type of Study: Bedside Swallow Evaluation Previous Swallow Assessment: See HPI Diet Prior to this Study: NPO Temperature Spikes Noted: No Respiratory Status: Room air History of Recent Intubation: No Behavior/Cognition: Alert;Cooperative;Requires cueing Oral Cavity Assessment: Within Functional Limits Oral Care Completed by SLP: Recent completion by staff Oral Cavity - Dentition: Dentures, top;Dentures, bottom Vision: Functional for self-feeding Self-Feeding Abilities: Needs assist Patient Positioning: Upright in bed Baseline Vocal Quality: Normal Volitional Cough: Weak Volitional Swallow: Able to elicit    Oral/Motor/Sensory Function Overall Oral Motor/Sensory Function: Moderate impairment Facial ROM: Reduced left;Suspected CN VII (facial) dysfunction Facial Symmetry: Abnormal symmetry left;Suspected CN VII (facial) dysfunction Facial Strength: Reduced left;Suspected CN VII (facial) dysfunction Facial Sensation: Reduced left;Suspected CN V (Trigeminal) dysfunction Lingual ROM: Reduced left;Suspected CN XII (hypoglossal) dysfunction Lingual Symmetry:  Abnormal symmetry right;Suspected CN XII (hypoglossal) dysfunction Lingual Strength: Reduced;Suspected CN XII (hypoglossal) dysfunction   Ice Chips Ice chips: Impaired Presentation: Spoon Pharyngeal Phase Impairments: Cough - Immediate   Thin Liquid Thin Liquid: Impaired Presentation: Spoon;Straw Pharyngeal  Phase Impairments: Cough - Immediate    Nectar Thick Nectar Thick Liquid: Impaired Presentation: Cup;Self Fed Pharyngeal Phase Impairments: Cough - Immediate   Honey Thick Honey Thick Liquid: Impaired Presentation: Cup;Self fed Pharyngeal Phase Impairments: Cough - Immediate   Puree Puree: Impaired Presentation: Spoon Pharyngeal Phase Impairments: Cough - Delayed   Solid     Solid: Impaired Presentation: Spoon Oral Phase Impairments: Poor awareness of bolus;Impaired mastication Oral Phase Functional Implications: Left lateral sulci pocketing;Prolonged oral transit;Impaired mastication;Oral residue Pharyngeal Phase Impairments: Cough - Delayed     Earnie Cable, M.S., CCC-SLP Acute Rehabilitation Services Office: [phone]  Earnie SQUIBB Shelah Heatley 02/24/2024,1:29 PM

## 2024-02-24 NOTE — Assessment & Plan Note (Signed)
 Chest x-ray without evidence of pneumonia Procalcitonin within normal limits

## 2024-02-24 NOTE — Evaluation (Signed)
 Occupational Therapy Evaluation Patient Details Name: Edward Maynard MRN: [id number] DOB: 14-Nov-1933 Today's Date: 02/24/2024   History of Present Illness   Pt is an 89 y/o M who presented to [facility] 12/6 for AMS, lethargy, and slurred speech. MRI brain revealed: acute non-hemorrhagic R posterior frontal lobe infarct. PMHx: CVA in Oct 2025 and R MCA stroke s/p thrombectomy with reocclusion and residual L hemiparesis, a-fib, HLD, mild dementia, HTN, anxiety, depression.     Clinical Impressions Pt seen for OT evaluation after third attempt today. Supportive daughter at bedside. PTA, pt from ALF, requires assist for toileting, bathing, dressing, and +1 assist for OOB via Stedy. Presents today with residual L hemiparesis (prior CVA) and neglect with strong R gaze preference. Does intermittently follow commands in L environment with max prompting. Difficulty managing secretions. He required max A for supine<>sit and CGA for static seated balance EOB ~8 mins. Max-total A for LB ADLs and mod A for UB ADLs, limited by some confusion, impaired cognition, and communication deficits. Deferred OOB 2/2 fatigue.  Pt is currently functioning below his baseline, recommend post-acute rehab <3 hrs/day upon d/c.     If plan is discharge home, recommend the following:   A lot of help with walking and/or transfers;A lot of help with bathing/dressing/bathroom;Assistance with cooking/housework;Direct supervision/assist for medications management;Direct supervision/assist for financial management;Assist for transportation;Help with stairs or ramp for entrance;Supervision due to cognitive status     Functional Status Assessment   Patient has had a recent decline in their functional status and demonstrates the ability to make significant improvements in function in a reasonable and predictable amount of time.     Equipment Recommendations   None recommended by OT     Recommendations for Other Services   PT  consult     Precautions/Restrictions   Precautions Precautions: Fall Precaution/Restrictions Comments: L hemiparesis at baseline Restrictions Weight Bearing Restrictions Per Provider Order: No     Mobility Bed Mobility Overal bed mobility: Needs Assistance Bed Mobility: Supine to Sit, Sit to Supine Rolling: Max assist   Supine to sit: Max assist Sit to supine: Mod assist   General bed mobility comments: incr assist for trunk elevation and LE mgmt, cued to use bed rails to assist    Transfers                   General transfer comment: deferred 2/2 pt fatigue      Balance Overall balance assessment: Needs assistance Sitting-balance support: Single extremity supported, No upper extremity supported, Feet supported Sitting balance-Leahy Scale: Fair Sitting balance - Comments: sitting EOB, intermittent CGA during static sitting                                   ADL either performed or assessed with clinical judgement   ADL Overall ADL's : Needs assistance/impaired Eating/Feeding: NPO (except ice chips per SLP recs)   Grooming: Moderate assistance;Sitting   Upper Body Bathing: Minimal assistance;Sitting   Lower Body Bathing: Maximal assistance;Sit to/from stand;Sitting/lateral leans   Upper Body Dressing : Minimal assistance;Sitting   Lower Body Dressing: Maximal assistance;Sitting/lateral leans;Sit to/from stand       Toileting- Architect and Hygiene: Total assistance;Sit to/from stand               Vision Baseline Vision/History: 1 Wears glasses Patient Visual Report:  (difficult to assess)       Perception  Praxis         Pertinent Vitals/Pain Pain Assessment Pain Assessment: No/denies pain     Extremity/Trunk Assessment Upper Extremity Assessment Upper Extremity Assessment: Right hand dominant;LUE deficits/detail RUE Deficits / Details: overall grossly WFL for basic ADLs LUE Deficits / Details:  L hemiparesis from prior CVA; shoulder ~2-/5, elbow ~1/5, grip trace movement LUE Coordination: decreased fine motor;decreased gross motor   Lower Extremity Assessment Lower Extremity Assessment: Defer to PT evaluation   Cervical / Trunk Assessment Cervical / Trunk Assessment: Kyphotic   Communication Communication Communication: Impaired Factors Affecting Communication: Difficulty expressing self;Reduced clarity of speech (dysarthric, dentures did not help with verbal communication)   Cognition Arousal: Alert Behavior During Therapy: Flat affect Cognition: History of cognitive impairments   Orientation impairments: Situation, Time (re-oriented appropriately)         OT - Cognition Comments: pt somewhat self-limiting, daughter reports that is baseline; flat affect and cognition dificult to formally assess 2/2 impaired communication; dtr reporting pt more delayed this date                 Following commands: Impaired Following commands impaired: Follows one step commands inconsistently, Follows one step commands with increased time     Cueing  General Comments   Cueing Techniques: Verbal cues;Gestural cues;Tactile cues  VSS; supportive daughter present with several questions   Exercises     Shoulder Instructions      Home Living Family/patient expects to be discharged to:: Assisted living   Available Help at Discharge: Other (Comment) (assisted living staff) Type of Home: Other(Comment) (assisted living)                              Lives With: Alone    Prior Functioning/Environment Prior Level of Function : Needs assist       Physical Assist : Mobility (physical);ADLs (physical) Mobility (physical): Transfers ADLs (physical): Bathing;Dressing;Toileting;IADLs Mobility Comments: needs +1 assist to transfer to [facility] and to/from w/c. Can use legs to propel self in w/c short distances. Has not walked in > 1 year. ADLs Comments: can feed self and  perform simple grooming indep/setup, needs assist for toileting, bathing, dressing.    OT Problem List: Decreased strength;Decreased range of motion;Decreased activity tolerance;Impaired balance (sitting and/or standing);Impaired vision/perception;Decreased safety awareness;Impaired UE functional use;Decreased cognition   OT Treatment/Interventions: Self-care/ADL training;Therapeutic exercise;Energy conservation;DME and/or AE instruction;Therapeutic activities;Patient/family education;Balance training      OT Goals(Current goals can be found in the care plan section)   Acute Rehab OT Goals Patient Stated Goal: to get something to eat OT Goal Formulation: With patient/family Time For Goal Achievement: 03/09/24 Potential to Achieve Goals: Fair   OT Frequency:  Min 2X/week    Co-evaluation              AM-PAC OT 6 Clicks Daily Activity     Outcome Measure Help from another person eating meals?: A Lot Help from another person taking care of personal grooming?: A Lot Help from another person toileting, which includes using toliet, bedpan, or urinal?: Total Help from another person bathing (including washing, rinsing, drying)?: A Lot Help from another person to put on and taking off regular upper body clothing?: A Little Help from another person to put on and taking off regular lower body clothing?: Total 6 Click Score: 11   End of Session Nurse Communication: Mobility status  Activity Tolerance: Patient tolerated treatment well Patient left: in bed;with call bell/phone  within reach;with bed alarm set;with family/visitor present  OT Visit Diagnosis: Unsteadiness on feet (R26.81);Other abnormalities of gait and mobility (R26.89);Muscle weakness (generalized) (M62.81)                Time: 9161 ([phone] (1357) OT Time Calculation (min): 10 min Charges:  OT General Charges $OT Visit: 1 Visit OT Evaluation $OT Eval Moderate Complexity: 1 Mod  Satine Hausner M. [place],  OTR/L [facility] Acute Rehabilitation Services [phone] Secure Chat Preferred  Rashon Westrup 02/24/2024, 3:49 PM

## 2024-02-24 NOTE — Progress Notes (Signed)
 OT Cancellation Note  Patient Details Name: Edward Maynard MRN: [id number] DOB: [birth year]   Cancelled Treatment:    Reason Eval/Treat Not Completed: Other (comment) (Initiated OT eval (10 mins), pt's daughter confirming PLOF questions. Transport team came to take pt to MRI before OT could progress with functional assessment. Will check back later as time allows.)   Jorryn Casagrande M. [place], OTR/L [facility] Acute Rehabilitation Services [phone] Secure Chat Preferred  Edward Maynard 02/24/2024, 9:14 AM

## 2024-02-25 ENCOUNTER — Inpatient Hospital Stay (HOSPITAL_COMMUNITY)

## 2024-02-25 DIAGNOSIS — G9340 Encephalopathy, unspecified: Secondary | ICD-10-CM | POA: Diagnosis not present

## 2024-02-25 DIAGNOSIS — R4182 Altered mental status, unspecified: Secondary | ICD-10-CM

## 2024-02-25 DIAGNOSIS — R569 Unspecified convulsions: Secondary | ICD-10-CM

## 2024-02-25 LAB — HEMOGLOBIN A1C
Hgb A1c MFr Bld: 5.7 % — ABNORMAL HIGH (ref 4.8–5.6)
Mean Plasma Glucose: 116.89 mg/dL

## 2024-02-25 MED ORDER — SODIUM CHLORIDE 0.9 % IV BOLUS: 250.0000 mL | Freq: Once | INTRAVENOUS | Status: DC

## 2024-02-25 MED ORDER — FOOD THICKENER (SIMPLYTHICK)
10.0000 | Freq: Once | ORAL | Status: DC
  Filled 2024-02-25: qty 10

## 2024-02-25 MED ORDER — METOPROLOL TARTRATE 5 MG/5ML IV SOLN
5.0000 mg | Freq: Once | INTRAVENOUS | Status: DC
  Filled 2024-02-25: qty 5

## 2024-02-25 MED ORDER — ENSURE PLUS HIGH PROTEIN PO LIQD
237.0000 mL | Freq: Two times a day (BID) | ORAL | Status: DC
  Administered 2024-02-26 (×2): 237 mL via ORAL

## 2024-02-25 NOTE — Procedures (Signed)
 Routine EEG Report  Edward Maynard is a 89 y.o. male with a history of altered mental status who is undergoing an EEG to evaluate for seizures.  Report: This EEG was acquired with electrodes placed according to the International 10-20 electrode system (including Fp1, Fp2, F3, F4, C3, C4, P3, P4, O1, O2, T3, T4, T5, T6, A1, A2, Fz, Cz, Pz). The following electrodes were missing or displaced: none.  The occipital dominant rhythm was 7 Hz. This activity is reactive to stimulation. Drowsiness was manifested by background fragmentation; deeper stages of sleep were not identified. There was no focal slowing. There were no interictal epileptiform discharges. There were no electrographic seizures identified. There was no abnormal response to photic stimulation or hyperventilation.   Impression and clinical correlation: This EEG was obtained while awake and drowsy and is abnormal due to mild diffuse slowing indicative of global cerebral dysfunction. Epileptiform abnormalities were not seen during this recording.  Elida Ross, MD [facility] [phone]  If 7pm- 7am, please page neurology on call as listed in AMION.

## 2024-02-25 NOTE — Progress Notes (Addendum)
 STROKE TEAM PROGRESS NOTE   SUBJECTIVE (INTERVAL HISTORY) His family is at the bedside.  Overall his condition is unchanged from yesterday per his wife. She is aware that pt is on the maximum approved medication regimen (Eliquis  + ASA) and noticed that in addition to the neuro findings listed below, that his personality is also not as baseline as he is much calmer now than he normally is.   OBJECTIVE Temp:  [98.4 F (36.9 C)-99.1 F (37.3 C)] 98.7 F (37.1 C) (12/08 0822) Pulse Rate:  [74-127] 74 (12/08 0822) Cardiac Rhythm: Normal sinus rhythm (12/08 0759) Resp:  [16-18] 18 (12/08 0822) BP: (103-148)/(66-80) 132/80 (12/08 0822) SpO2:  [93 %-94 %] 93 % (12/08 0822)  Recent Labs  Lab 02/23/24 1428  GLUCAP 99   Recent Labs  Lab 02/23/24 1354 02/23/24 2150 02/24/24 0610  NA 140  --  139  K 3.8  --  3.9  CL 105  --  105  CO2 27  --  24  GLUCOSE 95  --  118*  BUN 12  --  11  CREATININE 1.21  --  1.19  CALCIUM  9.2  --  8.8*  MG  --  1.8  --   PHOS  --  3.2  --    Recent Labs  Lab 02/23/24 1354 02/24/24 0610  AST 20 17  ALT 28 24  ALKPHOS 128* 115  BILITOT 0.7 1.0  PROT 7.0 6.2*  ALBUMIN 3.6 3.3*   Recent Labs  Lab 02/23/24 1354 02/24/24 0610  WBC 8.7 7.4  HGB 14.5 13.7  HCT 43.5 40.4  MCV 93.3 92.2  PLT 162 149*   Recent Labs  Lab 02/23/24 2150  CKTOTAL 57   No results for input(s): LABPROT, INR in the last 72 hours. Recent Labs    02/23/24 1705  COLORURINE YELLOW  LABSPEC 1.014  PHURINE 6.0  GLUCOSEU NEGATIVE  HGBUR NEGATIVE  BILIRUBINUR NEGATIVE  KETONESUR NEGATIVE  PROTEINUR 30*  NITRITE NEGATIVE  LEUKOCYTESUR NEGATIVE       Component Value Date/Time   CHOL 103 02/24/2024 0610   TRIG 84 02/24/2024 0610   HDL 36 (L) 02/24/2024 0610   CHOLHDL 2.9 02/24/2024 0610   VLDL 17 02/24/2024 0610   LDLCALC 50 02/24/2024 0610   Lab Results  Component Value Date   HGBA1C 5.7 (H) 02/25/2024      Component Value Date/Time   LABOPIA NONE  DETECTED 02/23/2024 1650   COCAINSCRNUR NONE DETECTED 02/23/2024 1650   LABBENZ NONE DETECTED 02/23/2024 1650   AMPHETMU NONE DETECTED 02/23/2024 1650   THCU NONE DETECTED 02/23/2024 1650   LABBARB NONE DETECTED 02/23/2024 1650    No results for input(s): ETH in the last 168 hours.  I have personally reviewed the radiological images below and agree with the radiology interpretations.  DG Swallowing Func-Speech Pathology Result Date: 02/25/2024 Table formatting from the original result was not included. Modified Barium Swallow Study Patient Details Name: Edward Maynard MRN: [id number] Date of Birth: 1933-12-13 Today's Date: 02/25/2024 HPI/PMH: HPI: Edward Maynard is a 89 y.o. male who presented to the ED from [name] assisted [facility] with complaints of altered mental status, left-sided hemineglect.  MRI remarkable for restricted diffusion in the right posterior frontal lobe, consistent  with acute non-hemorrhagic infarct. CXR negative for acute findings.  Pt was most recently seen for a clinical swallow evaluation on 10/12 with recommendations for Dysphagia 3 solids and thin liquids. PMH significant for HTN, HLD, A-fib on Eliquis , h/o  CVA with residual left-sided weakness, mild dementia, anxiety, depression. 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  Start a dysphagia 1 diet; small sips thin liquids.  Must be upright for meals.  Meds whole with puree.  Pt with significant sensorimotor deficits that impact ability to control material in his mouth and direct its movement into the pharynx.  There was poor awareness of significant residue that was trapped in the left cheek.  There was relatively good pharyngeal strength with only trace residue after the swallow.  Aspiration occurred x2 when thin liquids prematurely spilled into the larynx before it could fully close.  Pt had a delayed, weak cough in response to aspiration. He did not aspirate nectar or honey thick  liquids.   Will need to supervise pt closely with meals;  there are several risk factors present that make him susceptible to developing an aspiration pna. SLP will follow.  Factors that may increase risk of adverse event in presence of aspiration Noe & Lianne 2021): Factors that may increase risk of adverse event in presence of aspiration Noe & Lianne 2021): Limited mobility; Frail or deconditioned; Weak cough Recommendations/Plan: Swallowing Evaluation Recommendations Swallowing Evaluation Recommendations Recommendations: PO diet PO Diet Recommendation: Dysphagia 1 (Pureed); Thin liquids (Level 0) Liquid Administration via: Cup Medication Administration: Whole meds with puree Supervision: Full assist for feeding Swallowing strategies  : Slow rate; Small bites/sips; Check for pocketing or oral holding Postural changes: Position pt fully upright for meals Oral care recommendations: Oral care BID (2x/day) Caregiver Recommendations: Remove water pitcher; Have oral suction available Treatment Plan Treatment Plan Treatment recommendations: Therapy as outlined in treatment plan below Follow-up recommendations: Skilled nursing-short term rehab (<3 hours/day) Functional status assessment: Patient has had a recent decline in their functional status and demonstrates the ability to make significant improvements in function in a reasonable and predictable amount of time. Treatment frequency: Min 2x/week Interventions: Patient/family education; Trials of upgraded texture/liquids; Diet toleration management by SLP; Respiratory muscle strength training Recommendations Recommendations for follow up therapy are one component of a multi-disciplinary discharge planning process, led by the attending physician.  Recommendations may be updated based on patient status, additional functional criteria and insurance authorization. Assessment: Orofacial Exam: Orofacial Exam Oral Cavity: Oral Hygiene: Pooled secretions (left mouth)  Oral Cavity - Dentition: Edentulous Oral Motor/Sensory Function: Suspected cranial nerve impairment CN V - Trigeminal: Left motor impairment; Left sensory impairment CN VII - Facial: Left motor impairment CN IX - Glossopharyngeal, CN X - Vagus: Left motor impairment Anatomy: No data recorded Boluses Administered: Boluses Administered Boluses Administered: Thin liquids (Level 0); Mildly thick liquids (Level 2, nectar thick); Moderately thick liquids (Level 3, honey thick); Puree; Solid  Oral Impairment Domain: Oral Impairment Domain Lip Closure: Escape progressing to mid-chin Tongue control during bolus hold: Not tested Location of oral residue : Tongue; Floor of mouth; Lateral sulci Initiation of pharyngeal swallow : Valleculae  Pharyngeal Impairment Domain: Pharyngeal Impairment Domain Soft palate elevation: No bolus between soft palate (SP)/pharyngeal wall (PW) Laryngeal elevation: Partial superior movement of thyroid  cartilage/partial approximation of arytenoids to epiglottic petiole Anterior hyoid excursion: Partial anterior movement Epiglottic movement: Complete inversion Laryngeal vestibule closure: Complete, no air/contrast in laryngeal vestibule Pharyngeal stripping wave : Present - diminished Pharyngeal contraction (A/P view only): N/A Pharyngoesophageal segment opening: Partial distention/partial duration, partial obstruction of flow Tongue base retraction: Narrow column of contrast or air between tongue base and PPW Pharyngeal residue: Trace residue within or on pharyngeal structures Location of pharyngeal residue: Valleculae  Esophageal Impairment Domain: No data recorded Pill: Pill Consistency administered: -- (NT) Penetration/Aspiration Scale Score: Penetration/Aspiration Scale Score 1.  Material does not enter airway: Mildly thick liquids (Level 2, nectar thick); Moderately thick liquids (Level 3, honey thick); Puree 7.  Material enters airway, passes BELOW cords and not ejected out despite cough  attempt by patient: Thin liquids (Level 0) (x2) Compensatory Strategies: Compensatory Strategies Compensatory strategies: Yes Straw: Ineffective (unable to sip through straw) Ineffective Straw: Thin liquid (Level 0); Mildly thick liquid (Level 2, nectar thick)   General Information: Caregiver present: No  Diet Prior to this Study: NPO   Temperature : Normal   Respiratory Status: WFL   Supplemental O2: None (Room air)   History of Recent Intubation: No  Behavior/Cognition: Alert; Cooperative; Requires cueing No data recorded Baseline vocal quality/speech: Dysphonic Volitional Cough: Able to elicit Volitional Swallow: Able to elicit Exam Limitations: Limited visibility Goal Planning: Prognosis for improved oropharyngeal function: Good No data recorded No data recorded Patient/Family Stated Goal: To eat/drink No data recorded Pain: Pain Assessment Pain Assessment: No/denies pain Breathing: 0 Negative Vocalization: 0 Facial Expression: 0 Body Language: 0 Consolability: 0 PAINAD Score: 0 End of Session: Start Time:SLP Start Time (ACUTE ONLY): 0836 Stop Time: SLP Stop Time (ACUTE ONLY): 0856 Time Calculation:SLP Time Calculation (min) (ACUTE ONLY): 20 min Charges: SLP Evaluations $ SLP Speech Visit: 1 Visit SLP Evaluations $BSS Swallow: 1 Procedure $MBS Swallow: 1 Procedure $ SLP EVAL LANGUAGE/SOUND PRODUCTION: 1 Procedure SLP visit diagnosis: SLP Visit Diagnosis: Dysphagia, oropharyngeal phase (R13.12) Past Medical History: Past Medical History: Diagnosis Date  High cholesterol   05/2013; no defecits  Hyperlipemia   Left arm weakness   Left hand weakness   Personal history of colonic polyp - adenoma  10/23/2013  Stroke (HCC) 05/2013  MRI on 07/02/13 = Multifocal acute & subacute infarction involving right frontal MCA/ACA & left parietal MCA/PCA watershed areas Past Surgical History: Past Surgical History: Procedure Laterality Date  IR CT HEAD LTD  05/10/2022  IR PERCUTANEOUS ART THROMBECTOMY/INFUSION INTRACRANIAL INC DIAG  ANGIO  05/10/2022  LOOP RECORDER IMPLANT N/A 10/31/2013  Procedure: LOOP RECORDER IMPLANT;  Surgeon: Lynwood JONETTA Rakers, MD;  Location: [facility] CATH LAB;  Service: Cardiovascular;  Laterality: N/A;  RADIOLOGY WITH ANESTHESIA N/A 05/10/2022  Procedure: IR WITH ANESTHESIA;  Surgeon: Dolphus Carrion, MD;  Location: [facility] OR;  Service: Radiology;  Laterality: N/A;  SHOULDER SURGERY Bilateral 2003, 1995 Vona Palma Laurice 02/25/2024, 12:30 PM  EEG adult Result Date: 02/25/2024 Matthews Elida HERO, MD     02/25/2024  7:28 AM Routine EEG Report Edward Maynard is a 89 y.o. male with a history of altered mental status who is undergoing an EEG to evaluate for seizures. Report: This EEG was acquired with electrodes placed according to the International 10-20 electrode system (including Fp1, Fp2, F3, F4, C3, C4, P3, P4, O1, O2, T3, T4, T5, T6, A1, A2, Fz, Cz, Pz). The following electrodes were missing or displaced: none. The occipital dominant rhythm was 7 Hz. This activity is reactive to stimulation. Drowsiness was manifested by background fragmentation; deeper stages of sleep were not identified. There was no focal slowing. There were no interictal epileptiform discharges. There were no electrographic seizures identified. There was no abnormal response to photic stimulation or hyperventilation. Impression and clinical correlation: This EEG was obtained while awake and drowsy and is abnormal due to mild diffuse slowing indicative of global cerebral dysfunction. Epileptiform abnormalities were not seen during this recording. Elida Matthews, MD [facility] [phone]  If 7pm- 7am, please page neurology on call as listed in AMION.   CT HEAD WO CONTRAST ( ) Result Date: 02/24/2024 EXAM: CT HEAD WITHOUT CONTRAST 02/24/2024 02:56:06 PM TECHNIQUE: CT of the head was performed without the administration of intravenous contrast. Automated exposure control, iterative reconstruction, and/or weight based adjustment of the mA/kV was  utilized to reduce the radiation dose to as low as reasonably achievable. COMPARISON: 02/23/2024 CLINICAL HISTORY: Neuro deficit, acute, stroke suspected FINDINGS: BRAIN AND VENTRICLES: No acute hemorrhage is present. Evolving anterior right MCA territory infarct involving the right frontal operculum and anterior corona radiata is noted. Previously noted remote posterior right MCA territory infarct is stable. Remote infarct of the anterior left frontal lobe is stable. A remote infarct of the left parietal lobe is stable. Remote lacunar infarcts are noted in the cerebellum bilaterally. Atherosclerotic calcifications are present in the cavernous carotid arteries bilaterally and at the dural margin of both vertebral arteries. No hyperdense vessel is present. No hydrocephalus. No extra-axial collection. No mass effect or midline shift. ORBITS: Bilateral lens replacements are noted. The globes and orbits are otherwise within normal limits. SINUSES: No acute abnormality. SOFT TISSUES AND SKULL: No acute soft tissue abnormality. No skull fracture. IMPRESSION: 1. Evolving anterior right MCA territory infarct involving the right frontal operculum and anterior corona radiata. 2. No acute intracranial hemorrhage. 3. Stable remote posterior right MCA territory infarct. 4. Stable remote infarcts in the anterior left frontal lobe and left parietal lobe. 5. Remote lacunar infarcts in the cerebellum bilaterally. 6. Atherosclerotic calcifications in the cavernous carotid and vertebral arteries. No hyperdense vessel is present. Electronically signed by: Lonni Necessary MD 02/24/2024 03:27 PM EST RP Workstation: [id number]   ECHOCARDIOGRAM COMPLETE Result Date: 02/24/2024    ECHOCARDIOGRAM REPORT   Patient Name:   Edward Maynard Date of Exam: 02/24/2024 Medical Rec #:  [id number]     Height:       72.0 in Accession #:    [id number]    Weight:       212.1 lb Date of Birth:  07-27-1933    BSA:          2.184 m Patient Age:    89  years      BP:           130/56 mmHg Patient Gender: M             HR:           69 bpm. Exam Location:  Inpatient Procedure: 2D Echo, Cardiac Doppler and Color Doppler (Both Spectral and Color            Flow Doppler were utilized during procedure). Indications:    Stroke  History:        Patient has prior history of Echocardiogram examinations, most                 recent 12/23/2023. Arrythmias:Atrial Fibrillation; Risk                 Factors:Hypertension and Dyslipidemia.  Sonographer:    Sherlean Dubin Referring Phys: [id number] ANASTASSIA DOUTOVA  Sonographer Comments: Image acquisition challenging due to patient body habitus and Image acquisition challenging due to respiratory motion. IMPRESSIONS  1. No obvious source of embolus.  2. Left ventricular ejection fraction, by estimation, is 60 to 65%. The left ventricle has normal function. The left ventricle has no regional wall motion abnormalities. Left ventricular diastolic parameters are consistent with Grade I diastolic dysfunction (impaired relaxation).  3.  Right ventricular systolic function is normal. The right ventricular size is normal.  4. Left atrial size was mildly dilated.  5. The mitral valve is abnormal. Trivial mitral valve regurgitation. No evidence of mitral stenosis.  6. Prior TTE done 12/23/23 had moderate AS with mean gradient 24 mmHg. Tech had trouble with CW due to respiratory motion and body habitus and unable to reproduce these measurements. Prior moderate AS . The aortic valve was not well visualized. Aortic valve regurgitation is trivial. see comment below.  7. The inferior vena cava is normal in size with greater than 50% respiratory variability, suggesting right atrial pressure of 3 mmHg. FINDINGS  Left Ventricle: Left ventricular ejection fraction, by estimation, is 60 to 65%. The left ventricle has normal function. The left ventricle has no regional wall motion abnormalities. Strain was performed and the global longitudinal strain is  indeterminate. The left ventricular internal cavity size was normal in size. There is no left ventricular hypertrophy. Left ventricular diastolic parameters are consistent with Grade I diastolic dysfunction (impaired relaxation). Right Ventricle: The right ventricular size is normal. No increase in right ventricular wall thickness. Right ventricular systolic function is normal. Left Atrium: Left atrial size was mildly dilated. Right Atrium: Right atrial size was normal in size. Pericardium: There is no evidence of pericardial effusion. Mitral Valve: The mitral valve is abnormal. There is mild thickening of the mitral valve leaflet(s). There is mild calcification of the mitral valve leaflet(s). Mild mitral annular calcification. Trivial mitral valve regurgitation. No evidence of mitral valve stenosis. Tricuspid Valve: The tricuspid valve is normal in structure. Tricuspid valve regurgitation is not demonstrated. No evidence of tricuspid stenosis. Aortic Valve: Prior TTE done 12/23/23 had moderate AS with mean gradient 24 mmHg. Tech had trouble with CW due to respiratory motion and body habitus and unable to reproduce these measurements. Prior moderate AS. The aortic valve was not well visualized. Aortic valve regurgitation is trivial. See comment below. Pulmonic Valve: The pulmonic valve was normal in structure. Pulmonic valve regurgitation is not visualized. No evidence of pulmonic stenosis. Aorta: The aortic root is normal in size and structure. Venous: The inferior vena cava is normal in size with greater than 50% respiratory variability, suggesting right atrial pressure of 3 mmHg. IAS/Shunts: The interatrial septum was not well visualized. Additional Comments: No obvious source of embolus. 3D was performed not requiring image post processing on an independent workstation and was indeterminate.  LEFT VENTRICLE PLAX 2D LVIDd:         5.30 cm   Diastology LVIDs:         3.50 cm   LV e' medial:    3.81 cm/s LV PW:          1.00 cm   LV E/e' medial:  15.8 LV IVS:        1.10 cm   LV e' lateral:   7.40 cm/s LVOT diam:     2.00 cm   LV E/e' lateral: 8.1 LV SV:         56 LV SV Index:   26 LVOT Area:     3.14 cm  RIGHT VENTRICLE             IVC RV Basal diam:  2.70 cm     IVC diam: 1.20 cm RV Mid diam:    2.00 cm RV S prime:     18.20 cm/s  PULMONARY VEINS TAPSE (M-mode): 1.4 cm      Diastolic Velocity: 19.60 cm/s  S/D Velocity:       1.30                             Systolic Velocity:  25.90 cm/s LEFT ATRIUM           Index        RIGHT ATRIUM           Index LA diam:      4.20 cm 1.92 cm/m   RA Area:     14.70 cm LA Vol (A2C): 55.3 ml 25.32 ml/m  RA Volume:   31.40 ml  14.38 ml/m LA Vol (A4C): 42.3 ml 19.37 ml/m  AORTIC VALVE LVOT Vmax:   88.60 cm/s LVOT Vmean:  57.900 cm/s LVOT VTI:    0.178 m  AORTA Ao Root diam: 3.20 cm Ao Asc diam:  2.80 cm MITRAL VALVE MV Area (PHT): 6.96 cm    SHUNTS MV Decel Time: 109 msec    Systemic VTI:  0.18 m MV E velocity: 60.30 cm/s  Systemic Diam: 2.00 cm MV A velocity: 43.00 cm/s MV E/A ratio:  1.40 Maude Emmer MD Electronically signed by Maude Emmer MD Signature Date/Time: 02/24/2024/12:05:30 PM    Final    MR BRAIN WO CONTRAST Result Date: 02/24/2024 EXAM: MRI BRAIN WITHOUT CONTRAST 02/24/2024 09:31:29 AM TECHNIQUE: Multiplanar multisequence MRI of the head/brain was performed without the administration of intravenous contrast. COMPARISON: MRI of the head dated 12/30/2023. CLINICAL HISTORY: Mental status change, unknown cause. FINDINGS: BRAIN AND VENTRICLES: There is new restricted diffusion present within the right posterior frontal lobe, consistent with an acute infarct. There are chronic encephalomalacia changes again demonstrated at the right frontoparietal junction and right temporal lobe. There is advanced diffuse periventricular and deep cerebral white matter disease. There is Wallerian degeneration of the right cerebral peduncle. There are  encephalomalacia changes within the left posterior frontal lobe related to small focal subcortical infarct. There is mild hemosiderin staining present within the right MCA distribution chronic infarct. No intracranial hemorrhage. No mass. No midline shift. No hydrocephalus. The sella is unremarkable. Normal flow voids. ORBITS: No acute abnormality. SINUSES AND MASTOIDS: No acute abnormality. BONES AND SOFT TISSUES: Normal marrow signal. No acute soft tissue abnormality. IMPRESSION: 1. New restricted diffusion in the right posterior frontal lobe, consistent with acute non-hemorrhagic infarct. 2. Chronic encephalomalacia changes at the right frontoparietal junction and right temporal lobe. 3. Advanced diffuse periventricular and deep cerebral white matter disease. 4. Wallerian degeneration of the right cerebral peduncle. 5. Encephalomalacia changes within the left posterior frontal lobe related to small focal subcortical infarct. 6. Mild hemosiderin staining within the right MCA distribution chronic infarct. Electronically signed by: Evalene Coho MD 02/24/2024 09:43 AM EST RP Workstation: [id number]   DG Abd 1 View Result Date: 02/23/2024 EXAM: 1 VIEW XRAY OF THE ABDOMEN 02/23/2024 09:31:00 PM COMPARISON: None available. CLINICAL HISTORY: Constipation FINDINGS: LINES, TUBES AND DEVICES: Left chest loop recorder device in place. BOWEL: Nonobstructive bowel gas pattern. SOFT TISSUES: Extravasated intravenous contrast noted partially in the collecting systems and the urinary bladder. No abnormal calcifications. BONES: Multilevel degenerative changes of the spine. Mild degenerative changes of the bilateral hips. No acute fracture. CHEST FINDINGS: Chronic elevation of right hemidiaphragm. IMPRESSION: 1. No acute abdominal process. Electronically signed by: Morgane Naveau MD 02/23/2024 09:44 PM EST RP Workstation: [id number]   CT ANGIO HEAD NECK W WO CM Result Date: 02/23/2024 EXAM: CTA HEAD AND NECK WITH AND  WITHOUT 02/23/2024 07:00:49  PM TECHNIQUE: CTA of the head and neck was performed with and without the administration of intravenous contrast. Multiplanar 2D and/or 3D reformatted images are provided for review. Automated exposure control, iterative reconstruction, and/or weight based adjustment of the mA/kV was utilized to reduce the radiation dose to as low as reasonably achievable. Stenosis of the internal carotid arteries measured using NASCET criteria. COMPARISON: CT Head today CLINICAL HISTORY: Stroke, follow up FINDINGS: AORTIC ARCH AND ARCH VESSELS: No dissection or arterial injury. No significant stenosis of the brachiocephalic or subclavian arteries. CERVICAL CAROTID ARTERIES: No dissection, arterial injury, or hemodynamically significant stenosis by NASCET criteria. CERVICAL VERTEBRAL ARTERIES: Patent. Similar severe proximal right vertebral artery stenosis. LUNGS AND MEDIASTINUM: Unremarkable. SOFT TISSUES: No acute abnormality. BONES: No acute abnormality. ANTERIOR CIRCULATION: No significant stenosis of the internal carotid arteries. No significant stenosis of the anterior cerebral arteries. Similar proximal right M2 MCA branch occlusion with distal reconstitution. No aneurysm. POSTERIOR CIRCULATION: Patent PCAs.  Moderate left P2 PCA stenosis. No significant stenosis of the basilar artery. No significant stenosis of the vertebral arteries. No aneurysm. OTHER: No dural venous sinus thrombosis on this non-dedicated study. IMPRESSION: 1. Similar proximal right M2 MCA branch occlusion with distal reconstitution. 2. Similar severe proximal right vertebral artery stenosis. 3. Similar moderate left P2 PCA stenosis. Electronically signed by: Gilmore Molt MD 02/23/2024 07:14 PM EST RP Workstation: [id number]   DG Chest Portable 1 View Result Date: 02/23/2024 EXAM: 1 VIEW(S) XRAY OF THE CHEST 02/23/2024 03:53:00 PM COMPARISON: 12/30/2023 CLINICAL HISTORY: AMS FINDINGS: LUNGS AND PLEURA: Elevated right  hemidiaphragm is noted. No focal pulmonary opacity. No pleural effusion. No pneumothorax. HEART AND MEDIASTINUM: No acute abnormality of the cardiac and mediastinal silhouettes. BONES AND SOFT TISSUES: No acute osseous abnormality. IMPRESSION: 1. No acute cardiopulmonary findings. Electronically signed by: Lynwood Seip MD 02/23/2024 04:13 PM EST RP Workstation: [id number]   CT Head Wo Contrast Result Date: 02/23/2024 EXAM: CT HEAD WITHOUT CONTRAST 02/23/2024 03:12:44 PM TECHNIQUE: CT of the head was performed without the administration of intravenous contrast. Automated exposure control, iterative reconstruction, and/or weight based adjustment of the mA/kV was utilized to reduce the radiation dose to as low as reasonably achievable. COMPARISON: CT head without contrast 12/30/2023. CLINICAL HISTORY: Delirium. FINDINGS: LIMITATIONS/ARTIFACTS: The study is moderately degraded by patient motion. BRAIN AND VENTRICLES: No acute hemorrhage. No evidence of acute infarct. Remote posterior right MCA territory infarct is again noted. Remote lacunar infarcts are present within the cerebellum bilaterally. Moderate white matter changes or similar to prior study. Atherosclerotic calcifications are present in the cavernous carotid arteries bilaterally and at the dural margin of both vertebral arteries. No hyperdense vessel is present. No hydrocephalus. No extra-axial collection. No mass effect or midline shift. ORBITS: No acute abnormality. SINUSES: No acute abnormality. SOFT TISSUES AND SKULL: No acute soft tissue abnormality. No skull fracture. IMPRESSION: 1. No acute intracranial abnormality. 2. Remote posterior right MCA territory infarct and remote bilateral cerebellar lacunar infarcts. 3. Moderate white matter changes, similar to prior study. 4. Atherosclerotic calcifications in the cavernous carotid arteries bilaterally and at the dural margin of both vertebral arteries. No hyperdense vessel is present. Electronically  signed by: Lonni Necessary MD 02/23/2024 03:32 PM EST RP Workstation: [id number]     PHYSICAL EXAM  Temp:  [98.4 F (36.9 C)-99.1 F (37.3 C)] 98.7 F (37.1 C) (12/08 0822) Pulse Rate:  [74-127] 74 (12/08 0822) Resp:  [16-18] 18 (12/08 0822) BP: (103-148)/(66-80) 132/80 (12/08 0822) SpO2:  [93 %-94 %] 93 % (12/08 0822)  General - Well nourished, well developed, in no apparent distress.  Cardiovascular - Regular rate.  Mental Status -  Level of arousal and orientation to time, place, and person were intact. Language including expression, naming, repetition, comprehension was assessed and found intact. Attention span and concentration were normal.  Cranial Nerves II - XII - II - Visual fields testing suggest L Lower Quadrantanopia.  III, IV, VI - Extraocular movements intact. V - Facial sensation significantly impaired on the left. VII - Facial movement significantly impaired on the left. VIII - Hearing & vestibular intact bilaterally. X - Palate elevates symmetrically. XI - Shoulder shrug weakened on the Left. XII - Tongue protrusion intact.  Motor Strength - The patient's strength was 3/5 through R arm, 3/5 R hip flexion, 4/5 R dorsiflexion. All other muscle groups 5/5. Bulk was normal and fasciculations were absent.    Sensory - Light touch, was assessed and was diminished on the L throughout with L neglect.  Coordination - The patient had normal FTN in R hand, did not have strength to test L hand. Tremor was absent.  Gait and Station - deferred.   ASSESSMENT/PLAN [name] is a 89 y.o. male with history of A-fib on Eliquis  + ASA, HLD, HTN, and history of CVA w/ residual L sided weakness, CKD, mild dementia, depression, and anxiety who was admitted for stroke. No TNK given.   Recurrent stroke:  R frontal stroke, secondary to large vessel disease from chronic R M2 occlusion vs PAF even on Eliquis  + ASA  MRI: New R posterior frontal lobe, non-hemorrhagic  infarct. 2D Echo: EF 60-65%, atrial normal size EEG no seizure LDL 50 HgbA1c 5.7 UDS negative Eliquis  for VTE prophylaxis aspirin  81 mg daily and Eliquis  (apixaban ) daily prior to admission, now on aspirin  81 mg daily and Eliquis  (apixaban ) daily.  Ongoing aggressive stroke risk factor management Therapy recommendations:  HH vs. SNF Disposition: TBD  Hx of Stroke/TIA 3-06/2013 bilateral infarcts on MRI.  MRA, TTE, CUS all negative.  Discharged on aspirin  and Zocor .  30-day CardioNet monitoring negative for A-fib.  Loop recorder placed in 10/2013. 06/2014 found to have A-fib on loop recorder.  Started Xarelto  by cardiology 04/2022 admitted for right large MCA infarct with right M2 occlusion, status post IR with TICI2c.  However, post IR MRI still showing persistent M2 occlusion.  EF 60 to 65%, LDL 56, A1c 5.8.  Xarelto  and was switched to Eliquis , and continue Lipitor  20. Patient with recent hospitalization from 12/22/23-12/25/23 after presenting with difficulty speaking and right-sided facial droop found to have acute left precentral gyrus and acute lacunar infarct of the right cerebral hemisphere.  CTA head and neck right M2 more proximal occlusion with severe stenosis.  EF 60 to 65%.  LDL 78, A1c 5.6.  Pradaxa not covered by insurance, continue Eliquis  on discharge. 12/30/2023 admitted for new right frontal lobe small infarcts.  CTA head and neck similar proximal right M2 occlusion with distal reconstitution.  Similar severe right VA stenosis.  Similar left ICA 60 to 70% stenosis.  Continued Eliquis  and add aspirin  81 on top.  Continue statin. Pan CT 12/30/2023 showed no malignancy  PAF Home Meds: Eliquis  Continue telemetry monitoring Continue anticoagulation with Eliquis   Amiodarone  and cardizem    Hypertension Stable Avoid low BP given R M2 occlusion Orthostatic vitals no orthostatic hypotension Long term BP goal 130-150 given right M2 occlusion  Hyperlipidemia Home meds:  Atorvastatin   80mg  daily LDL 50, goal < 70 Now on Atorvastatin  80mg   daily Continue statin at discharge  Dysphagia Poststroke dysphagia Speech on board Now on dysphagia 1 and nectar thick liquid Aspiration precautions  Other Stroke Risk Factors Advanced age Hx stroke/TIA  Other Active Problems CKD 2, creatinine 1.19  Hospital day # 1  Neurology will continue to follow.  CHARM Penne Mori, DO, PGY1 Neurology Stroke Team 02/25/2024 1:03 PM  ATTENDING NOTE: I reviewed above note and agree with the assessment and plan. Pt was seen and examined.   Daughter at bedside.  Patient lying bed, feeling sad regarding new stroke.  Severe dysarthria, orientated to time, place, age.  Left facial droop, left lower quadrantanopsia, left upper extremity 3/5, left lower extremity 3/5.  Sensation decreased on the left, with left seventh agnosia.   Discussed with daughter at bedside, daughter denies any hypotension at home.  Today orthostatic vitals showed no evidence of orthostatic hypotension.  Patient recurrent stroke could be from chronic right M2 occlusion or cardioembolic source with PAF even on Eliquis  and aspirin .  May consider Watchman device, however no guideline recommended indication at this time.  Will continue Eliquis  and aspirin  for now.  PT and OT recommend home health versus SNF.  Will follow.  For detailed assessment and plan, please refer to above as I have made changes wherever appropriate.   Ary Cummins, MD PhD Stroke Neurology 02/25/2024 6:27 PM  I discussed with Dr. Arlice, speech therapist and PT. I spent extensive total face-to-face time with the patient and and his daughter, reviewing test results, images and medication, and discussing the diagnosis, treatment options and potential prognosis. This patient's care requiresreview of multiple databases, neurological assessment, discussion with family, other specialists and medical decision making of high complexity.   To contact Stroke  Continuity provider, please refer to [email or web address]. After hours, contact General Neurology

## 2024-02-25 NOTE — Plan of Care (Signed)
  Problem: Clinical Measurements: Goal: Will remain free from infection Outcome: Progressing Goal: Respiratory complications will improve Outcome: Progressing   Problem: Pain Managment: Goal: General experience of comfort will improve and/or be controlled Outcome: Progressing   Problem: Activity: Goal: Risk for activity intolerance will decrease Outcome: Not Progressing   Problem: Education: Goal: Knowledge of disease or condition will improve Outcome: Not Progressing   Problem: Ischemic Stroke/TIA Tissue Perfusion: Goal: Complications of ischemic stroke/TIA will be minimized Outcome: Not Progressing

## 2024-02-25 NOTE — Progress Notes (Signed)
 I just brought the Metoprolol  and the bolus to be given. He has converted back to NSR in the 90s. Notified DR. Franky and received a new order to hold them off for now. Will continue to monitor.

## 2024-02-25 NOTE — Progress Notes (Signed)
 Speech Language Pathology Treatment: Dysphagia  Patient Details Name: Edward Maynard MRN: [id number] DOB: 01/01/1934 Today's Date: 02/25/2024 Time: 1140-1210 SLP Time Calculation (min) (ACUTE ONLY): 30 min  Assessment / Plan / Recommendation Clinical Impression  Followed-up with pt and his daughter, [name], after this morning's MBS to review results and assist with eating/drinking newly ordered diet.  Oral care provided; dentures cleaned and placed. Nearly all trials of thin liquids led to coughing, even when cued for small sip, concerning for aspiration - likely due to premature spillage of liquid into airway before larynx could close. Nectar thick liquids did not elicit coughing.    Spoke with [name] re: her dad's susceptibility to developing an aspiration pna.  Agreed that we would thicken to liquids to nectar for now to help protect airway while he is recovering from acute stroke. Discussed necessity of vigilant oral care and sitting up in recliner when possible to help keep his lungs healthy.   SLP will follow. Sign hung at [facility].     HPI HPI: Edward Maynard is a 89 y.o. male who presented to the ED from [name] assisted living with complaints of altered mental status, left-sided hemineglect.  MRI remarkable for restricted diffusion in the right posterior frontal lobe, consistent  with acute non-hemorrhagic infarct. CXR negative for acute findings.  Pt was most recently seen for a clinical swallow evaluation on 10/12 with recommendations for Dysphagia 3 solids and thin liquids. PMH significant for HTN, HLD, A-fib on Eliquis , h/o CVA with residual left-sided weakness, mild dementia, anxiety, depression.      SLP Plan  Continue with current plan of care        Swallow Evaluation Recommendations   Recommendations: PO diet PO Diet Recommendation: Dysphagia 1 (Pureed);Mildly thick liquids (Level 2, nectar thick) Liquid Administration via: Cup Medication Administration: Whole meds  with puree Supervision: Staff to assist with self-feeding;Patient able to self-feed;Full supervision/cueing for swallowing strategies Postural changes: Position pt fully upright for meals Oral care recommendations: Oral care BID (2x/day)     Recommendations                     Oral care BID   Frequent or constant Supervision/Assistance Dysphagia, oropharyngeal phase (R13.12)     Continue with current plan of care   Jarreau Callanan L. Vona, MA CCC/SLP Clinical Specialist - Acute Care SLP Acute Rehabilitation Services Office number [phone]   Vona Palma Laurice  02/25/2024, 12:54 PM

## 2024-02-25 NOTE — Progress Notes (Signed)
 PROGRESS NOTE  Edward Maynard  DOB: 1933-11-18  PCP: Sherre Rea, FNP FMW:[id number]  DOA: 02/23/2024  LOS: 1 day  Hospital Day: 3  Subjective: Patient was seen and examined this morning. Lying down in bed.  Not in distress.  Alert, awake Aware that he is in the hospital.  Says it is Sunday.  Not in any discomfort  Event from last night noted Patient was in A-fib with RVR.  Metoprolol  IV was planned but he converted back to normal sinus rhythm before it could be given Afebrile, heart rate in 80s, blood pressure 140s, breathing on room air  I discussed with patient's daughter Ms. Wendy at bedside. She is aware of patient's frail condition.  She does not want aggressive measures.  She understands that he has been medically optimized for stroke prevention. Palliative care consulted  Brief narrative: Edward Maynard is a 89 y.o. male with PMH significant for HTN, HLD, A-fib on Eliquis , h/o CVA with residual left-sided weakness, mild dementia, anxiety, depression. Patient lives at [facility] assisted living facility Has residual left-sided weakness from prior stroke but at baseline, patient is alert, oriented and able to feed himself. In February 2025, he had a new stroke and at the time he was switched from Xarelto  to Eliquis . Recently hospitalized twice in October, both times for new neurological symptoms. MRI brain done on 10/5 and 10/12 both showed new infarcts.  Ultimate recommendation from neurology was to continue Eliquis  and an additional aspirin  81 mg daily indefinitely.    12/6, patient presented to the ED with complaint of altered mental status, left-sided hemineglect.  His daughter noted him to more lethargic than baseline with the speech more slurred  In the ED, afebrile, hemodynamically stable, breathing on room air Seen by neurology Patient was felt not to be a good candidate for intervention and he was already on maximal therapy such as Eliquis  CT head nonacute showed  remote posterior right MCA CTA head similar to prior proximal right M2 MCA branch occlusion with distal reconstitution similar severe proximal right vertebral artery stenosis and moderate left P2 PCA stenosis Admitted to [facility]  MRI brain showed a new new restricted diffusion in the right posterior frontal lobe, consistent with acute non-hemorrhagic infarct. Neurology consulted.  Stroke workup was started.  Assessment and plan: Acute nonhemorrhagic infarct of the right posterior frontal lobe Multiple prior strokes Patient has multiple prior strokes including 2 recent strokes 2 months ago  Chronically on Eliquis  and aspirin  Brought in for altered mentation, slurred speech, worsening weakness CT head and CTA head and neck did not show any new finding MRI brain showed an acute infarct as above. Stroke workup was ordered. Echo showed EF of 60 to 65%, no WMA, G1 DD,, no intracardiac source of embolism A1c 5.7 on 02/25/2024 HDL low at 36, LDL 50 Continue telemetry, neurochecks PT/OT/ST eval obtained PTA meds-medical optimized with Eliquis  and aspirin .  Continue the same Palliative care consulted  Acute encephalopathy Other etiologies of AMS were considered as well but urinalysis unremarkable for infection, U tox negative.  Respiratory panel negative.  Ammonia level not elevated. Per H&P, by the time of admission, neurological exam was nonfocal but patient unable to cooperate fully  12/8, EEG did not show any active epileptiform activity Stroke respond but mental status seems stable. Current alert, awake, able to answer simple questions.  Dysphagia  worsening cough 12/7, SLP eval obtained.  Moderate aspiration risk recommended ice chips as needed and n.p.o.  To be seen  by speech again.  Family seem to be willing to accept the risk and feed him.   A-fib with RVR  Chronic a-fib  Patient had a short lasting episode of A-fib with RVR last night.  Metoprolol  IV was planned but he converted  back to normal sinus rhythm before it could be given PTA meds- amiodarone  100 mg daily, Cardizem  120 mg daily Currently continued on the same.   Chronically anticoagulated with Eliquis  5 mg p.o. twice daily   Essential hypertension Allow permissive hypertension Concern of orthostatic hypotension.  To be monitored.  H/o moderate AS Noted in echo from October.  Unable to reproduce the measurements on echo this time. Follow-up with cardiology as an outpatient  HLD Continue Lipitor   Prolonged QT interval Admission EKG with QTc 570 ms.  Repeat EKG today with QTc 440 ms Continue monitor electrolytes and avoid QTc prolonging medications    Mild dementia Anxiety/depression PTA meds- Prozac , Remeron , trazodone , Atarax  as needed, Continue as before   Nutrition Status:         Mobility: Pending PT eval  PT Orders: Active   PT Follow up Rec:     Goals of care   Code Status: Limited: Do not attempt resuscitation (DNR) -DNR-LIMITED -Do Not Intubate/DNI      DVT prophylaxis:  SCD's Start: 02/23/24 2255 apixaban  (ELIQUIS ) tablet 5 mg   Antimicrobials: None Fluid: None Consultants: Neurology, palliative care Family Communication: Daughter [name] at bedside  Status: Observation Level of care:  Telemetry   Patient is from: Home Needs to continue in-hospital care: Ongoing stroke workup Anticipated d/c to: Pending clinical course, pending neurology, PT eval.  Ultimately ALF versus SNF    Diet:  Diet Order             Diet NPO time specified Except for: Ice Chips  Diet effective now                   Scheduled Meds:  amiodarone   100 mg Oral Daily   apixaban   5 mg Oral BID   aspirin  EC  81 mg Oral Daily   atorvastatin   80 mg Oral Daily   diltiazem   120 mg Oral Daily   FLUoxetine   20 mg Oral Daily   mirtazapine   7.5 mg Oral QHS   traZODone   100 mg Oral QHS    PRN meds: acetaminophen  **OR** acetaminophen  (TYLENOL ) oral liquid 160 mg/5 mL **OR**  acetaminophen , guaiFENesin -dextromethorphan    Infusions:   sodium chloride  75 mL/hr at 02/25/24 1123    Antimicrobials: Anti-infectives (From admission, onward)    None       Objective: Vitals:   02/25/24 0822 02/25/24 0822  BP: 132/80 132/80  Pulse: 74 74  Resp: 18   Temp: 98.7 F (37.1 C) 98.7 F (37.1 C)  SpO2: 93% 93%    Intake/Output Summary (Last 24 hours) at 02/25/2024 1157 Last data filed at 02/25/2024 1100 Gross per 24 hour  Intake 1442.91 ml  Output 600 ml  Net 842.91 ml   Filed Weights   02/23/24 2244  Weight: 96.2 kg   Weight change:  Body mass index is 28.76 kg/m.   Physical Exam: General exam: Pleasant, elderly Caucasian male.  Not in distress Skin: No rashes, lesions or ulcers. HEENT: Atraumatic, normocephalic, no obvious bleeding Lungs: Clear to auscultation bilaterally,  CVS: S1, S2, no murmur,   GI/Abd: Soft, nontender, nondistended, bowel sound present,   CNS: Alert, awake, facial asymmetry seen.  Alert, awake, oriented to place Psychiatry:  Sad affect Extremities: No pedal edema, no calf tenderness,   Data Review: I have personally reviewed the laboratory data and studies available.  F/u labs ordered Unresulted Labs (From admission, onward)    None       Signed, Chapman Rota, MD [facility] 02/25/2024

## 2024-02-25 NOTE — Progress Notes (Signed)
   02/24/24 2336  Assess: MEWS Score  Temp 99.1 F (37.3 C)  BP 103/75  MAP (mmHg) 85  Pulse Rate (!) 127  Resp 18  SpO2 94 %  O2 Device Room Air  Assess: MEWS Score  MEWS Temp 0  MEWS Systolic 0  MEWS Pulse 2  MEWS RR 0  MEWS LOC 0  MEWS Score 2  MEWS Score Color Yellow  Assess: if the MEWS score is Yellow or Red  Were vital signs accurate and taken at a resting state? Yes  Does the patient meet 2 or more of the SIRS criteria? No  MEWS guidelines implemented  Yes, yellow  Treat  MEWS Interventions Considered administering scheduled or prn medications/treatments as ordered  Take Vital Signs  Increase Vital Sign Frequency  Yellow: Q2hr x1, continue Q4hrs until patient remains green for 12hrs  Escalate  MEWS: Escalate Yellow: Discuss with charge nurse and consider notifying provider and/or RRT  Notify: Charge Nurse/RN  Name of Charge Nurse/RN Notified Inocente FLETCHER RN  Provider Notification  Provider Name/Title Dr. Franky  Date Provider Notified 02/25/24  Time Provider Notified 0007  Method of Notification Page (secured chat)  Notification Reason New onset of dysrhythmia  Provider response See new orders (EKG)  Date of Provider Response 02/25/24  Time of Provider Response 0008  Assess: SIRS CRITERIA  SIRS Temperature  0  SIRS Respirations  0  SIRS Pulse 1  SIRS WBC 0  SIRS Score Sum  1   The patient went to junctional tachy in the upper 120s. He was resting comfortably when re assessment was done. Notified Dr. Franky and received order for EKG.

## 2024-02-25 NOTE — Evaluation (Signed)
 Modified Barium Swallow Study  Patient Details  Name: RAED SCHALK MRN: [id number] Date of Birth: 1933/12/11  Today's Date: 02/25/2024  Modified Barium Swallow completed.  Full report located under Chart Review in the Imaging Section.  History of Present Illness LARNELL GRANLUND is a 89 y.o. male who presented to the ED from [name] assisted living with complaints of altered mental status, left-sided hemineglect.  MRI remarkable for restricted diffusion in the right posterior frontal lobe, consistent  with acute non-hemorrhagic infarct. CXR negative for acute findings.  Pt was most recently seen for a clinical swallow evaluation on 10/12 with recommendations for Dysphagia 3 solids and thin liquids. PMH significant for HTN, HLD, A-fib on Eliquis , h/o CVA with residual left-sided weakness, mild dementia, anxiety, depression.   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  Start a dysphagia 1 diet; small sips thin liquids.  Must be upright for meals.  Meds whole with puree.   Pt with significant sensorimotor deficits that impact ability to control material in his mouth and direct its movement into the pharynx.  There was poor awareness of significant residue that was trapped in the left cheek.  There was relatively good pharyngeal strength with only trace residue after the swallow.  Aspiration occurred x2 when thin liquids prematurely spilled into the larynx before it could fully close.  Pt had a delayed, weak cough in response to aspiration. He did not aspirate nectar or honey thick liquids.    Will need to supervise pt closely with meal; there are several risk factors present that make him susceptible to developing an aspiration pna. SLP will follow.   Factors that may increase risk of adverse event in presence of aspiration Noe & Lianne 2021): Limited mobility;Frail or deconditioned;Weak cough  Swallow Evaluation Recommendations Recommendations: PO  diet PO Diet Recommendation: Dysphagia 1 (Pureed);Thin liquids (Level 0) Liquid Administration via: Cup Medication Administration: Whole meds with puree Supervision: Full assist for feeding Swallowing strategies  : Slow rate;Small bites/sips;Check for pocketing or oral holding Postural changes: Position pt fully upright for meals Oral care recommendations: Oral care BID (2x/day)      Vona Palma Laurice 02/25/2024,12:26 PM

## 2024-02-25 NOTE — TOC Initial Note (Signed)
 Transition of Care [facility]) - Initial/Assessment Note    Patient Details  Name: Edward Maynard MRN: [id number] Date of Birth: 1933-10-05  Transition of Care [facility]) CM/SW Contact:    Almarie CHRISTELLA Goodie, LCSW Phone Number: 02/25/2024, 11:28 AM  Clinical Narrative:      CSW met with patient's daughter, [name], at bedside to discuss disposition. Patient from [name], where he has been in ALF for over a year. Preference would be to return to ALF if possible, they already provide a heavy level of care for the patient and do have higher levels of care (patient is on level 5 out of 6 care levels for ALF). Patient's spouse is also there in memory care. CSW discussed awaiting updated recommendations, and will reach out to [name] about increasing care. Daughter appreciative. Daughter also mentioned if he would qualify for hospice care at this time. CSW relayed message to MD, palliative consult placed to discuss with family for goals of care. CSW to follow.             Expected Discharge Plan: Assisted Living Barriers to Discharge: Continued Medical Work up   Patient Goals and CMS Choice Patient states their goals for this hospitalization and ongoing recovery are:: patient unable to participate in goal setting, not fully oriented CMS Medicare.gov Compare Post Acute Care list provided to:: Patient Represenative (must comment) Choice offered to / list presented to : Adult Children      Expected Discharge Plan and Services     Post Acute Care Choice: NA Living arrangements for the past 2 months: Assisted Living Facility                                      Prior Living Arrangements/Services Living arrangements for the past 2 months: Assisted Living Facility Lives with:: Facility Resident Patient language and need for interpreter reviewed:: No Do you feel safe going back to the place where you live?: Yes      Need for Family Participation in Patient Care: Yes (Comment) Care  giver support system in place?: Yes (comment)   Criminal Activity/Legal Involvement Pertinent to Current Situation/Hospitalization: No - Comment as needed  Activities of Daily Living   ADL Screening (condition at time of admission) Independently performs ADLs?: Yes (appropriate for developmental age) Is the patient deaf or have difficulty hearing?: No Does the patient have difficulty seeing, even when wearing glasses/contacts?: No Does the patient have difficulty concentrating, remembering, or making decisions?: No  Permission Sought/Granted Permission sought to share information with : Facility Medical Sales Representative, Family Supports Permission granted to share information with : Yes, Verbal Permission Granted  Share Information with NAME: [name]  Permission granted to share info w AGENCY: [name]  Permission granted to share info w Relationship: Daughter     Emotional Assessment Appearance:: Appears stated age Attitude/Demeanor/Rapport: Unable to Assess Affect (typically observed): Unable to Assess Orientation: : Oriented to Self, Oriented to Place, Oriented to  Time Alcohol  / Substance Use: Not Applicable Psych Involvement: No (comment)  Admission diagnosis:  Acute encephalopathy [G93.40] Patient Active Problem List   Diagnosis Date Noted   Cough 02/24/2024   Prolonged QT interval 02/23/2024   History of stroke 02/23/2024   Acute CVA (cerebrovascular accident) (HCC) 12/31/2023   Acute encephalopathy 12/31/2023   Hypokalemia 12/30/2023   Prediabetes 12/30/2023   Lesion of parotid gland 12/30/2023   AAA (abdominal aortic aneurysm) 12/30/2023  Acute focal neurological deficit 12/22/2023   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   Pulmonary lesion, right 12/22/2023   Anxiety and depression 12/22/2023   Shoulder impingement syndrome, right 08/10/2022   CKD (chronic kidney disease) 05/18/2022   Insomnia 05/18/2022   CVA (cerebral vascular accident) (HCC) 05/18/2022    Essential hypertension 05/16/2022   Obesity 05/16/2022   Advanced age 89/27/2024   Dysphagia 05/16/2022   History of stroke with current residual effects 05/10/2022   Middle cerebral artery embolism, 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   Chronic a-fib (HCC) 05/10/2022   Paroxysmal atrial fibrillation (HCC) 01/18/2015   Chronic anticoagulation 01/18/2015   PVC's (premature ventricular contractions) 12/14/2014   NSVT (nonsustained ventricular tachycardia) (HCC) 12/14/2014   Cerebral infarction due to embolism of cerebral artery (HCC) 07/10/2014   Hyperlipidemia 12/12/2013   History of colonic polyps 10/23/2013   PCP:  Sherre Rea, FNP Pharmacy:   [facility] [place] - [address] [place] [address] Phone: [phone] Fax: [phone]     Social Drivers of Health (SDOH) Social History: SDOH Screenings   Food Insecurity: No Food Insecurity (02/24/2024)  Housing: Low Risk  (02/24/2024)  Transportation Needs: No Transportation Needs (02/24/2024)  Utilities: Not At Risk (02/24/2024)  Depression (PHQ2-9): Low Risk  (11/10/2022)  Social Connections: Patient Declined (02/24/2024)  Tobacco Use: Medium Risk (02/23/2024)   SDOH Interventions:     Readmission Risk Interventions     No data to display

## 2024-02-25 NOTE — Evaluation (Signed)
 Physical Therapy Evaluation Patient Details Name: Edward Maynard MRN: [id number] DOB: 1933-12-29 Today's Date: 02/25/2024  History of Present Illness  Pt is an 89 y/o M who presented to [facility] 12/6 for AMS, lethargy, and slurred speech. MRI brain revealed: acute non-hemorrhagic R posterior frontal lobe infarct. PMHx: CVA in Oct 2025 and R MCA stroke s/p thrombectomy with reocclusion and residual L hemiparesis, a-fib, HLD, mild dementia, HTN, anxiety, depression.  Clinical Impression  Pt presents with admitting diagnosis above. Pt today was able to stand in stedy with +2 Min A. Orthostatics checked per MD request and were negative. BP; 141/66 supine, BP: 194/78 seated, BP: 199/92 standing, BP: 188/73 return to supine. MD made aware. PTA pt was a +1 assist with a stedy per chart review. Recommend HHPT upon DC at ALF. PT will continue to follow.          If plan is discharge home, recommend the following: A little help with walking and/or transfers;Assistance with cooking/housework;Assist for transportation;Help with stairs or ramp for entrance   Can travel by private vehicle        Equipment Recommendations None recommended by PT  Recommendations for Other Services       Functional Status Assessment Patient has had a recent decline in their functional status and demonstrates the ability to make significant improvements in function in a reasonable and predictable amount of time.     Precautions / Restrictions Precautions Precautions: Fall Recall of Precautions/Restrictions: Intact Precaution/Restrictions Comments: L hemiparesis at baseline Restrictions Weight Bearing Restrictions Per Provider Order: No      Mobility  Bed Mobility Overal bed mobility: Needs Assistance Bed Mobility: Supine to Sit, Sit to Supine     Supine to sit: Mod assist Sit to supine: Max assist   General bed mobility comments: incr assist for trunk elevation and LE mgmt, cued to use bed rails to assist     Transfers Overall transfer level: Needs assistance Equipment used: Ambulation equipment used Transfers: Sit to/from Stand Sit to Stand: +2 physical assistance, Min assist, Via lift equipment           General transfer comment: Able to stand with +2 Min A to get BP however pt stating that he needed to sit down after ~1 minute. Transfer via Lift Equipment: Stedy  Ambulation/Gait               General Gait Details: Pt is non-ambulatory for greater than one year  Stairs            Wheelchair Mobility     Tilt Bed    Modified Rankin (Stroke Patients Only) Modified Rankin (Stroke Patients Only) Pre-Morbid Rankin Score: Severe disability Modified Rankin: Severe disability     Balance Overall balance assessment: Needs assistance Sitting-balance support: Single extremity supported, No upper extremity supported, Feet supported Sitting balance-Leahy Scale: Fair Sitting balance - Comments: sitting EOB, intermittent CGA during static sitting   Standing balance support: Bilateral upper extremity supported Standing balance-Leahy Scale: Poor Standing balance comment: Reliant on therapist and UE support.                             Pertinent Vitals/Pain Pain Assessment Pain Assessment: No/denies pain    Home Living Family/patient expects to be discharged to:: Assisted living   Available Help at Discharge: Other (Comment) Type of Home: Other(Comment)           Home Equipment: Wheelchair - manual;Shower seat;Other (comment)  Additional Comments: staff can provide 24/7 support    Prior Function Prior Level of Function : Needs assist             Mobility Comments: needs +1 assist to transfer to [facility] and to/from w/c. Can use legs to propel self in w/c short distances. Has not walked in > 1 year. ADLs Comments: can feed self and perform simple grooming indep/setup, needs assist for toileting, bathing, dressing.     Extremity/Trunk Assessment    Upper Extremity Assessment Upper Extremity Assessment: LUE deficits/detail LUE Deficits / Details: L hemiparesis from prior CVA; shoulder ~2-/5, elbow ~1/5, grip trace movement    Lower Extremity Assessment Lower Extremity Assessment: Generalized weakness    Cervical / Trunk Assessment Cervical / Trunk Assessment: Kyphotic  Communication   Communication Communication: Impaired Factors Affecting Communication: Difficulty expressing self;Reduced clarity of speech    Cognition Arousal: Alert Behavior During Therapy: Flat affect   PT - Cognitive impairments: Attention, Initiation, Sequencing, Problem solving, Safety/Judgement                         Following commands: Impaired Following commands impaired: Follows one step commands inconsistently, Follows one step commands with increased time     Cueing Cueing Techniques: Verbal cues, Gestural cues, Tactile cues     General Comments General comments (skin integrity, edema, etc.): BP; 141/66 supine, BP: 194/78 seated, BP: 199/92 standing, BP: 188/73 return to supine. MD made aware.    Exercises     Assessment/Plan    PT Assessment Patient needs continued PT services  PT Problem List Decreased strength;Decreased activity tolerance;Decreased balance;Decreased mobility;Decreased cognition;Impaired sensation       PT Treatment Interventions DME instruction;Gait training;Functional mobility training;Therapeutic activities;Therapeutic exercise;Balance training;Neuromuscular re-education;Cognitive remediation;Wheelchair mobility training;Patient/family education    PT Goals (Current goals can be found in the Care Plan section)  Acute Rehab PT Goals Patient Stated Goal: to be able to half way take care of himself PT Goal Formulation: With patient Time For Goal Achievement: 03/10/24 Potential to Achieve Goals: Good    Frequency Min 2X/week     Co-evaluation               AM-PAC PT 6 Clicks Mobility   Outcome Measure Help needed turning from your back to your side while in a flat bed without using bedrails?: A Lot Help needed moving from lying on your back to sitting on the side of a flat bed without using bedrails?: A Lot Help needed moving to and from a bed to a chair (including a wheelchair)?: A Lot Help needed standing up from a chair using your arms (e.g., wheelchair or bedside chair)?: A Lot Help needed to walk in hospital room?: Total Help needed climbing 3-5 steps with a railing? : Total 6 Click Score: 10    End of Session Equipment Utilized During Treatment: Gait belt Activity Tolerance: Patient tolerated treatment well Patient left: in bed;with call bell/phone within reach;with bed alarm set;with family/visitor present Nurse Communication: Mobility status;Need for lift equipment PT Visit Diagnosis: Unsteadiness on feet (R26.81);Other abnormalities of gait and mobility (R26.89);Muscle weakness (generalized) (M62.81);Difficulty in walking, not elsewhere classified (R26.2)    Time: 8556-8495 PT Time Calculation (min) (ACUTE ONLY): 21 min   Charges:   PT Evaluation $PT Eval Moderate Complexity: 1 Mod   PT General Charges $$ ACUTE PT VISIT: 1 Visit         Kaylem Gidney B, PT, DPT Acute Rehab Services [phone]  Meenakshi Sazama 02/25/2024, 4:13 PM

## 2024-02-26 ENCOUNTER — Institutional Professional Consult (permissible substitution) (INDEPENDENT_AMBULATORY_CARE_PROVIDER_SITE_OTHER): Admitting: Otolaryngology

## 2024-02-26 MED ORDER — MORPHINE SULFATE (CONCENTRATE) 10 MG /0.5 ML PO SOLN: 5.0000 mg | ORAL | Status: DC | PRN

## 2024-02-26 MED ORDER — GLYCOPYRROLATE 1 MG PO TABS: 1.0000 mg | ORAL_TABLET | ORAL | Status: DC | PRN

## 2024-02-26 MED ORDER — BIOTENE DRY MOUTH MT LIQD: 15.0000 mL | OROMUCOSAL | Status: DC | PRN

## 2024-02-26 MED ORDER — LORAZEPAM 1 MG PO TABS: 1.0000 mg | ORAL_TABLET | ORAL | Status: DC | PRN

## 2024-02-26 MED ORDER — ONDANSETRON 4 MG PO TBDP: 4.0000 mg | ORAL_TABLET | Freq: Four times a day (QID) | ORAL | Status: DC | PRN

## 2024-02-26 MED ORDER — LORAZEPAM 2 MG/ML IJ SOLN: 1.0000 mg | INTRAMUSCULAR | Status: DC | PRN

## 2024-02-26 MED ORDER — BISACODYL 10 MG RE SUPP: 10.0000 mg | Freq: Every day | RECTAL | Status: DC | PRN

## 2024-02-26 MED ORDER — POLYVINYL ALCOHOL 1.4 % OP SOLN: 1.0000 [drp] | Freq: Four times a day (QID) | OPHTHALMIC | Status: DC | PRN

## 2024-02-26 MED ORDER — LORAZEPAM 2 MG/ML PO CONC: 1.0000 mg | ORAL | Status: DC | PRN

## 2024-02-26 MED ORDER — HALOPERIDOL LACTATE 2 MG/ML PO CONC: 0.5000 mg | ORAL | Status: DC | PRN

## 2024-02-26 MED ORDER — GLYCOPYRROLATE 0.2 MG/ML IJ SOLN: 0.2000 mg | INTRAMUSCULAR | Status: DC | PRN

## 2024-02-26 MED ORDER — HALOPERIDOL 0.5 MG PO TABS: 0.5000 mg | ORAL_TABLET | ORAL | Status: DC | PRN

## 2024-02-26 MED ORDER — SENNA 8.6 MG PO TABS: 1.0000 | ORAL_TABLET | Freq: Every evening | ORAL | Status: DC | PRN

## 2024-02-26 MED ORDER — ONDANSETRON HCL 4 MG/2ML IJ SOLN: 4.0000 mg | Freq: Four times a day (QID) | INTRAMUSCULAR | Status: DC | PRN

## 2024-02-26 MED ORDER — HALOPERIDOL LACTATE 5 MG/ML IJ SOLN: 0.5000 mg | INTRAMUSCULAR | Status: DC | PRN

## 2024-02-26 NOTE — Progress Notes (Signed)
 PROGRESS NOTE  SHAW DOBEK  DOB: Feb 23, 1934  PCP: Sherre Rea, FNP FMW:[id number]  DOA: 02/23/2024  LOS: 2 days  Hospital Day: 4  Subjective: Patient was seen and examined this morning. Lying on bed.  Alert, awake, able to have some conversation.  Family not at the bedside. Seen by PT yesterday.  Able to stand with assistance.  Brief narrative: VENSON FERENCZ is a 89 y.o. male with PMH significant for HTN, HLD, A-fib on Eliquis , h/o CVA with residual left-sided weakness, mild dementia, anxiety, depression. Patient lives at [facility] assisted living facility Has residual left-sided weakness from prior stroke but at baseline, patient is alert, oriented and able to feed himself. In February 2025, he had a new stroke and at the time he was switched from Xarelto  to Eliquis . Recently hospitalized twice in October, both times for new neurological symptoms. MRI brain done on 10/5 and 10/12 both showed new infarcts.  Ultimate recommendation from neurology was to continue Eliquis  and an additional aspirin  81 mg daily indefinitely.    12/6, patient presented to the ED with complaint of altered mental status, left-sided hemineglect.  His daughter noted him to more lethargic than baseline with the speech more slurred  In the ED, afebrile, hemodynamically stable, breathing on room air Seen by neurology Patient was felt not to be a good candidate for intervention and he was already on maximal therapy such as Eliquis  CT head nonacute showed remote posterior right MCA CTA head similar to prior proximal right M2 MCA branch occlusion with distal reconstitution similar severe proximal right vertebral artery stenosis and moderate left P2 PCA stenosis Admitted to [facility]  MRI brain showed a new new restricted diffusion in the right posterior frontal lobe, consistent with acute non-hemorrhagic infarct. Neurology consulted.  Stroke workup was started.  Assessment and plan: Acute nonhemorrhagic infarct  of the right posterior frontal lobe Multiple prior strokes Patient has multiple prior strokes including 2 recent strokes 2 months ago  Chronically on Eliquis  and aspirin  Brought in for altered mentation, slurred speech, worsening weakness CT head and CTA head and neck did not show any new finding MRI brain showed an acute infarct as above. Stroke workup was ordered. Echo showed EF of 60 to 65%, no WMA, G1 DD,, no intracardiac source of embolism A1c 5.7 on 02/25/2024 HDL low at 36, LDL 50 Continue telemetry, neurochecks PT/OT/ST eval obtained.  Home health PT recommended PTA meds-medical optimized with Eliquis  and aspirin .  Continue the same Palliative care consulted  Acute encephalopathy Other etiologies of AMS were considered as well but urinalysis unremarkable for infection, U tox negative.  Respiratory panel negative.  Ammonia level not elevated. Per H&P, by the time of admission, neurological exam was nonfocal but patient unable to cooperate fully  12/8, EEG did not show any active epileptiform activity Mental status seems stable. Current alert, awake, able to answer simple questions.  Dysphagia  worsening cough 12/7, SLP eval obtained.  Moderate aspiration risk recommended ice chips as needed and n.p.o.  To be seen by speech again.  Family seem to be willing to accept the risk and feed him.   A-fib with RVR  Chronic a-fib  12/7, patient had a short lasting episode of A-fib with RVR. Metoprolol  IV was planned but he converted back to normal sinus rhythm before it could be given PTA meds- amiodarone  100 mg daily, Cardizem  120 mg daily Currently continued on the same.   Chronically anticoagulated with Eliquis  5 mg p.o. twice daily  Essential hypertension Allow permissive hypertension No orthostatic blood pressure drop noted.  H/o moderate AS Noted in echo from October.  Unable to reproduce the measurements on echo this time. Follow-up with cardiology as an  outpatient  HLD Continue Lipitor   Prolonged QT interval Admission EKG with QTc 570 ms.  Repeat EKG today with QTc 440 ms Continue monitor electrolytes and avoid QTc prolonging medications    Mild dementia Anxiety/depression PTA meds- Prozac , Remeron , trazodone , Atarax  as needed, Continue as before   Nutrition Status:        PT Orders: Active   PT Follow up Rec: Home Health Pt (At Alf)02/25/2024 1600    Goals of care   Code Status: Limited: Do not attempt resuscitation (DNR) -DNR-LIMITED -Do Not Intubate/DNI      DVT prophylaxis:  SCD's Start: 02/23/24 2255 apixaban  (ELIQUIS ) tablet 5 mg   Antimicrobials: None Fluid: None Consultants: Neurology, palliative care Family Communication: Daughter [name] not at bedside today  Status: Observation Level of care:  Telemetry   Patient is from: Home Needs to continue in-hospital care: Stroke workup completed.  Pending palliative care.  Family is open to the idea of hospice care if he worsens.    Diet:  Diet Order             DIET - DYS 1 Room service appropriate? Yes with Assist; Fluid consistency: Nectar Thick  Diet effective now                   Scheduled Meds:  amiodarone   100 mg Oral Daily   apixaban   5 mg Oral BID   aspirin  EC  81 mg Oral Daily   atorvastatin   80 mg Oral Daily   diltiazem   120 mg Oral Daily   feeding supplement  237 mL Oral BID BM   FLUoxetine   20 mg Oral Daily   food thickener  10 packet Oral Once   mirtazapine   7.5 mg Oral QHS   traZODone   100 mg Oral QHS    PRN meds: acetaminophen  **OR** acetaminophen  (TYLENOL ) oral liquid 160 mg/5 mL **OR** acetaminophen , guaiFENesin -dextromethorphan    Infusions:   sodium chloride  75 mL/hr at 02/26/24 0229    Antimicrobials: Anti-infectives (From admission, onward)    None       Objective: Vitals:   02/26/24 0717 02/26/24 1158  BP: (!) 153/61 (!) 114/57  Pulse: 63 60  Resp: 19 16  Temp: 98.7 F (37.1 C) 98 F (36.7 C)   SpO2: 92% 95%    Intake/Output Summary (Last 24 hours) at 02/26/2024 1314 Last data filed at 02/26/2024 1030 Gross per 24 hour  Intake 200 ml  Output 1150 ml  Net -950 ml   Filed Weights   02/23/24 2244  Weight: 96.2 kg   Weight change:  Body mass index is 28.76 kg/m.   Physical Exam: General exam: Pleasant, elderly Caucasian male.  Not in distress. Skin: No rashes, lesions or ulcers. HEENT: Atraumatic, normocephalic, no obvious bleeding Lungs: Clear to auscultation bilaterally,  CVS: S1, S2, no murmur,   GI/Abd: Soft, nontender, nondistended, bowel sound present,   CNS:  Alert, awake, oriented to place Psychiatry: Mood appropriate Extremities: No pedal edema, no calf tenderness,   Data Review: I have personally reviewed the laboratory data and studies available.  F/u labs ordered Unresulted Labs (From admission, onward)    None       Signed, Chapman Rota, MD [facility] 02/26/2024

## 2024-02-26 NOTE — Progress Notes (Addendum)
 STROKE TEAM PROGRESS NOTE   SUBJECTIVE (INTERVAL HISTORY)  PT has recommended HHPT at an assisted living facility, OT has recommended post-acute rehab. Pt complains of difficulty sleeping. On exam, he is unable to lift L arm or Leg against gravity which is worsening of some of the same symptoms than yesterday.    OBJECTIVE Temp:  [98 F (36.7 C)-99.1 F (37.3 C)] 98.7 F (37.1 C) (12/09 0717) Pulse Rate:  [63-74] 63 (12/09 0717) Cardiac Rhythm: Heart block (12/09 0800) Resp:  [16-19] 19 (12/09 0717) BP: (130-153)/(55-78) 153/61 (12/09 0717) SpO2:  [92 %-96 %] 92 % (12/09 0717)  Recent Labs  Lab 02/23/24 1428  GLUCAP 99   Recent Labs  Lab 02/23/24 1354 02/23/24 2150 02/24/24 0610  NA 140  --  139  K 3.8  --  3.9  CL 105  --  105  CO2 27  --  24  GLUCOSE 95  --  118*  BUN 12  --  11  CREATININE 1.21  --  1.19  CALCIUM  9.2  --  8.8*  MG  --  1.8  --   PHOS  --  3.2  --    Recent Labs  Lab 02/23/24 1354 02/24/24 0610  AST 20 17  ALT 28 24  ALKPHOS 128* 115  BILITOT 0.7 1.0  PROT 7.0 6.2*  ALBUMIN 3.6 3.3*   Recent Labs  Lab 02/23/24 1354 02/24/24 0610  WBC 8.7 7.4  HGB 14.5 13.7  HCT 43.5 40.4  MCV 93.3 92.2  PLT 162 149*   Recent Labs  Lab 02/23/24 2150  CKTOTAL 57   No results for input(s): LABPROT, INR in the last 72 hours. Recent Labs    02/23/24 1705  COLORURINE YELLOW  LABSPEC 1.014  PHURINE 6.0  GLUCOSEU NEGATIVE  HGBUR NEGATIVE  BILIRUBINUR NEGATIVE  KETONESUR NEGATIVE  PROTEINUR 30*  NITRITE NEGATIVE  LEUKOCYTESUR NEGATIVE       Component Value Date/Time   CHOL 103 02/24/2024 0610   TRIG 84 02/24/2024 0610   HDL 36 (L) 02/24/2024 0610   CHOLHDL 2.9 02/24/2024 0610   VLDL 17 02/24/2024 0610   LDLCALC 50 02/24/2024 0610   Lab Results  Component Value Date   HGBA1C 5.7 (H) 02/25/2024      Component Value Date/Time   LABOPIA NONE DETECTED 02/23/2024 1650   COCAINSCRNUR NONE DETECTED 02/23/2024 1650   LABBENZ NONE  DETECTED 02/23/2024 1650   AMPHETMU NONE DETECTED 02/23/2024 1650   THCU NONE DETECTED 02/23/2024 1650   LABBARB NONE DETECTED 02/23/2024 1650    No results for input(s): ETH in the last 168 hours.  I have personally reviewed the radiological images below and agree with the radiology interpretations.  DG Swallowing Func-Speech Pathology Result Date: 02/25/2024 Table formatting from the original result was not included. Modified Barium Swallow Study Patient Details Name: AVREY HYSER MRN: [id number] Date of Birth: 08/22/1933 Today's Date: 02/25/2024 HPI/PMH: HPI: TORIS LAVERDIERE is a 89 y.o. male who presented to the ED from [name] assisted living with complaints of altered mental status, left-sided hemineglect.  MRI remarkable for restricted diffusion in the right posterior frontal lobe, consistent  with acute non-hemorrhagic infarct. CXR negative for acute findings.  Pt was most recently seen for a clinical swallow evaluation on 10/12 with recommendations for Dysphagia 3 solids and thin liquids. PMH significant for HTN, HLD, A-fib on Eliquis , h/o CVA with residual left-sided weakness, mild dementia, anxiety, depression. 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  Start a dysphagia 1 diet; small sips thin liquids.  Must be upright for meals.  Meds whole with puree.  Pt with significant sensorimotor deficits that impact ability to control material in his mouth and direct its movement into the pharynx.  There was poor awareness of significant residue that was trapped in the left cheek.  There was relatively good pharyngeal strength with only trace residue after the swallow.  Aspiration occurred x2 when thin liquids prematurely spilled into the larynx before it could fully close.  Pt had a delayed, weak cough in response to aspiration. He did not aspirate nectar or honey thick liquids.   Will need to supervise pt closely with meals;  there are several risk factors  present that make him susceptible to developing an aspiration pna. SLP will follow.  Factors that may increase risk of adverse event in presence of aspiration Noe & Lianne 2021): Factors that may increase risk of adverse event in presence of aspiration Noe & Lianne 2021): Limited mobility; Frail or deconditioned; Weak cough Recommendations/Plan: Swallowing Evaluation Recommendations Swallowing Evaluation Recommendations Recommendations: PO diet PO Diet Recommendation: Dysphagia 1 (Pureed); Thin liquids (Level 0) Liquid Administration via: Cup Medication Administration: Whole meds with puree Supervision: Full assist for feeding Swallowing strategies  : Slow rate; Small bites/sips; Check for pocketing or oral holding Postural changes: Position pt fully upright for meals Oral care recommendations: Oral care BID (2x/day) Caregiver Recommendations: Remove water pitcher; Have oral suction available Treatment Plan Treatment Plan Treatment recommendations: Therapy as outlined in treatment plan below Follow-up recommendations: Skilled nursing-short term rehab (<3 hours/day) Functional status assessment: Patient has had a recent decline in their functional status and demonstrates the ability to make significant improvements in function in a reasonable and predictable amount of time. Treatment frequency: Min 2x/week Interventions: Patient/family education; Trials of upgraded texture/liquids; Diet toleration management by SLP; Respiratory muscle strength training Recommendations Recommendations for follow up therapy are one component of a multi-disciplinary discharge planning process, led by the attending physician.  Recommendations may be updated based on patient status, additional functional criteria and insurance authorization. Assessment: Orofacial Exam: Orofacial Exam Oral Cavity: Oral Hygiene: Pooled secretions (left mouth) Oral Cavity - Dentition: Edentulous Oral Motor/Sensory Function: Suspected cranial nerve  impairment CN V - Trigeminal: Left motor impairment; Left sensory impairment CN VII - Facial: Left motor impairment CN IX - Glossopharyngeal, CN X - Vagus: Left motor impairment Anatomy: No data recorded Boluses Administered: Boluses Administered Boluses Administered: Thin liquids (Level 0); Mildly thick liquids (Level 2, nectar thick); Moderately thick liquids (Level 3, honey thick); Puree; Solid  Oral Impairment Domain: Oral Impairment Domain Lip Closure: Escape progressing to mid-chin Tongue control during bolus hold: Not tested Location of oral residue : Tongue; Floor of mouth; Lateral sulci Initiation of pharyngeal swallow : Valleculae  Pharyngeal Impairment Domain: Pharyngeal Impairment Domain Soft palate elevation: No bolus between soft palate (SP)/pharyngeal wall (PW) Laryngeal elevation: Partial superior movement of thyroid  cartilage/partial approximation of arytenoids to epiglottic petiole Anterior hyoid excursion: Partial anterior movement Epiglottic movement: Complete inversion Laryngeal vestibule closure: Complete, no air/contrast in laryngeal vestibule Pharyngeal stripping wave : Present - diminished Pharyngeal contraction (A/P view only): N/A Pharyngoesophageal segment opening: Partial distention/partial duration, partial obstruction of flow Tongue base retraction: Narrow column of contrast or air between tongue base and PPW Pharyngeal residue: Trace residue within or on pharyngeal structures Location of pharyngeal residue: Valleculae  Esophageal Impairment Domain: No data recorded Pill: Pill Consistency administered: -- (NT) Penetration/Aspiration Scale Score: Penetration/Aspiration  Scale Score 1.  Material does not enter airway: Mildly thick liquids (Level 2, nectar thick); Moderately thick liquids (Level 3, honey thick); Puree 7.  Material enters airway, passes BELOW cords and not ejected out despite cough attempt by patient: Thin liquids (Level 0) (x2) Compensatory Strategies: Compensatory  Strategies Compensatory strategies: Yes Straw: Ineffective (unable to sip through straw) Ineffective Straw: Thin liquid (Level 0); Mildly thick liquid (Level 2, nectar thick)   General Information: Caregiver present: No  Diet Prior to this Study: NPO   Temperature : Normal   Respiratory Status: WFL   Supplemental O2: None (Room air)   History of Recent Intubation: No  Behavior/Cognition: Alert; Cooperative; Requires cueing No data recorded Baseline vocal quality/speech: Dysphonic Volitional Cough: Able to elicit Volitional Swallow: Able to elicit Exam Limitations: Limited visibility Goal Planning: Prognosis for improved oropharyngeal function: Good No data recorded No data recorded Patient/Family Stated Goal: To eat/drink No data recorded Pain: Pain Assessment Pain Assessment: No/denies pain Breathing: 0 Negative Vocalization: 0 Facial Expression: 0 Body Language: 0 Consolability: 0 PAINAD Score: 0 End of Session: Start Time:SLP Start Time (ACUTE ONLY): 0836 Stop Time: SLP Stop Time (ACUTE ONLY): 0856 Time Calculation:SLP Time Calculation (min) (ACUTE ONLY): 20 min Charges: SLP Evaluations $ SLP Speech Visit: 1 Visit SLP Evaluations $BSS Swallow: 1 Procedure $MBS Swallow: 1 Procedure $ SLP EVAL LANGUAGE/SOUND PRODUCTION: 1 Procedure SLP visit diagnosis: SLP Visit Diagnosis: Dysphagia, oropharyngeal phase (R13.12) Past Medical History: Past Medical History: Diagnosis Date  High cholesterol   05/2013; no defecits  Hyperlipemia   Left arm weakness   Left hand weakness   Personal history of colonic polyp - adenoma  10/23/2013  Stroke (HCC) 05/2013  MRI on 07/02/13 = Multifocal acute & subacute infarction involving right frontal MCA/ACA & left parietal MCA/PCA watershed areas Past Surgical History: Past Surgical History: Procedure Laterality Date  IR CT HEAD LTD  05/10/2022  IR PERCUTANEOUS ART THROMBECTOMY/INFUSION INTRACRANIAL INC DIAG ANGIO  05/10/2022  LOOP RECORDER IMPLANT N/A 10/31/2013  Procedure: LOOP RECORDER IMPLANT;   Surgeon: Lynwood JONETTA Rakers, MD;  Location: [facility] CATH LAB;  Service: Cardiovascular;  Laterality: N/A;  RADIOLOGY WITH ANESTHESIA N/A 05/10/2022  Procedure: IR WITH ANESTHESIA;  Surgeon: Dolphus Carrion, MD;  Location: [facility] OR;  Service: Radiology;  Laterality: N/A;  SHOULDER SURGERY Bilateral 2003, 1995 Vona Palma Laurice 02/25/2024, 12:30 PM  EEG adult Result Date: 02/25/2024 Matthews Elida HERO, MD     02/25/2024  7:28 AM Routine EEG Report NICKOLAS CHALFIN is a 89 y.o. male with a history of altered mental status who is undergoing an EEG to evaluate for seizures. Report: This EEG was acquired with electrodes placed according to the International 10-20 electrode system (including Fp1, Fp2, F3, F4, C3, C4, P3, P4, O1, O2, T3, T4, T5, T6, A1, A2, Fz, Cz, Pz). The following electrodes were missing or displaced: none. The occipital dominant rhythm was 7 Hz. This activity is reactive to stimulation. Drowsiness was manifested by background fragmentation; deeper stages of sleep were not identified. There was no focal slowing. There were no interictal epileptiform discharges. There were no electrographic seizures identified. There was no abnormal response to photic stimulation or hyperventilation. Impression and clinical correlation: This EEG was obtained while awake and drowsy and is abnormal due to mild diffuse slowing indicative of global cerebral dysfunction. Epileptiform abnormalities were not seen during this recording. Elida Matthews, MD [facility] [phone] If 7pm- 7am, please page neurology on call as listed in AMION.   CT HEAD  WO CONTRAST ( ) Result Date: 02/24/2024 EXAM: CT HEAD WITHOUT CONTRAST 02/24/2024 02:56:06 PM TECHNIQUE: CT of the head was performed without the administration of intravenous contrast. Automated exposure control, iterative reconstruction, and/or weight based adjustment of the mA/kV was utilized to reduce the radiation dose to as low as reasonably achievable. COMPARISON:  02/23/2024 CLINICAL HISTORY: Neuro deficit, acute, stroke suspected FINDINGS: BRAIN AND VENTRICLES: No acute hemorrhage is present. Evolving anterior right MCA territory infarct involving the right frontal operculum and anterior corona radiata is noted. Previously noted remote posterior right MCA territory infarct is stable. Remote infarct of the anterior left frontal lobe is stable. A remote infarct of the left parietal lobe is stable. Remote lacunar infarcts are noted in the cerebellum bilaterally. Atherosclerotic calcifications are present in the cavernous carotid arteries bilaterally and at the dural margin of both vertebral arteries. No hyperdense vessel is present. No hydrocephalus. No extra-axial collection. No mass effect or midline shift. ORBITS: Bilateral lens replacements are noted. The globes and orbits are otherwise within normal limits. SINUSES: No acute abnormality. SOFT TISSUES AND SKULL: No acute soft tissue abnormality. No skull fracture. IMPRESSION: 1. Evolving anterior right MCA territory infarct involving the right frontal operculum and anterior corona radiata. 2. No acute intracranial hemorrhage. 3. Stable remote posterior right MCA territory infarct. 4. Stable remote infarcts in the anterior left frontal lobe and left parietal lobe. 5. Remote lacunar infarcts in the cerebellum bilaterally. 6. Atherosclerotic calcifications in the cavernous carotid and vertebral arteries. No hyperdense vessel is present. Electronically signed by: Lonni Necessary MD 02/24/2024 03:27 PM EST RP Workstation: [id number]   ECHOCARDIOGRAM COMPLETE Result Date: 02/24/2024    ECHOCARDIOGRAM REPORT   Patient Name:   MORDECAI TINDOL Date of Exam: 02/24/2024 Medical Rec #:  [id number]     Height:       72.0 in Accession #:    [id number]    Weight:       212.1 lb Date of Birth:  11/19/1933    BSA:          2.184 m Patient Age:    89 years      BP:           130/56 mmHg Patient Gender: M             HR:           69 bpm.  Exam Location:  Inpatient Procedure: 2D Echo, Cardiac Doppler and Color Doppler (Both Spectral and Color            Flow Doppler were utilized during procedure). Indications:    Stroke  History:        Patient has prior history of Echocardiogram examinations, most                 recent 12/23/2023. Arrythmias:Atrial Fibrillation; Risk                 Factors:Hypertension and Dyslipidemia.  Sonographer:    Sherlean Dubin Referring Phys: [id number] ANASTASSIA DOUTOVA  Sonographer Comments: Image acquisition challenging due to patient body habitus and Image acquisition challenging due to respiratory motion. IMPRESSIONS  1. No obvious source of embolus.  2. Left ventricular ejection fraction, by estimation, is 60 to 65%. The left ventricle has normal function. The left ventricle has no regional wall motion abnormalities. Left ventricular diastolic parameters are consistent with Grade I diastolic dysfunction (impaired relaxation).  3. Right ventricular systolic function is normal. The right ventricular size is normal.  4. Left atrial  size was mildly dilated.  5. The mitral valve is abnormal. Trivial mitral valve regurgitation. No evidence of mitral stenosis.  6. Prior TTE done 12/23/23 had moderate AS with mean gradient 24 mmHg. Tech had trouble with CW due to respiratory motion and body habitus and unable to reproduce these measurements. Prior moderate AS . The aortic valve was not well visualized. Aortic valve regurgitation is trivial. see comment below.  7. The inferior vena cava is normal in size with greater than 50% respiratory variability, suggesting right atrial pressure of 3 mmHg. FINDINGS  Left Ventricle: Left ventricular ejection fraction, by estimation, is 60 to 65%. The left ventricle has normal function. The left ventricle has no regional wall motion abnormalities. Strain was performed and the global longitudinal strain is indeterminate. The left ventricular internal cavity size was normal in size. There is no  left ventricular hypertrophy. Left ventricular diastolic parameters are consistent with Grade I diastolic dysfunction (impaired relaxation). Right Ventricle: The right ventricular size is normal. No increase in right ventricular wall thickness. Right ventricular systolic function is normal. Left Atrium: Left atrial size was mildly dilated. Right Atrium: Right atrial size was normal in size. Pericardium: There is no evidence of pericardial effusion. Mitral Valve: The mitral valve is abnormal. There is mild thickening of the mitral valve leaflet(s). There is mild calcification of the mitral valve leaflet(s). Mild mitral annular calcification. Trivial mitral valve regurgitation. No evidence of mitral valve stenosis. Tricuspid Valve: The tricuspid valve is normal in structure. Tricuspid valve regurgitation is not demonstrated. No evidence of tricuspid stenosis. Aortic Valve: Prior TTE done 12/23/23 had moderate AS with mean gradient 24 mmHg. Tech had trouble with CW due to respiratory motion and body habitus and unable to reproduce these measurements. Prior moderate AS. The aortic valve was not well visualized. Aortic valve regurgitation is trivial. See comment below. Pulmonic Valve: The pulmonic valve was normal in structure. Pulmonic valve regurgitation is not visualized. No evidence of pulmonic stenosis. Aorta: The aortic root is normal in size and structure. Venous: The inferior vena cava is normal in size with greater than 50% respiratory variability, suggesting right atrial pressure of 3 mmHg. IAS/Shunts: The interatrial septum was not well visualized. Additional Comments: No obvious source of embolus. 3D was performed not requiring image post processing on an independent workstation and was indeterminate.  LEFT VENTRICLE PLAX 2D LVIDd:         5.30 cm   Diastology LVIDs:         3.50 cm   LV e' medial:    3.81 cm/s LV PW:         1.00 cm   LV E/e' medial:  15.8 LV IVS:        1.10 cm   LV e' lateral:   7.40 cm/s  LVOT diam:     2.00 cm   LV E/e' lateral: 8.1 LV SV:         56 LV SV Index:   26 LVOT Area:     3.14 cm  RIGHT VENTRICLE             IVC RV Basal diam:  2.70 cm     IVC diam: 1.20 cm RV Mid diam:    2.00 cm RV S prime:     18.20 cm/s  PULMONARY VEINS TAPSE (M-mode): 1.4 cm      Diastolic Velocity: 19.60 cm/s  S/D Velocity:       1.30                             Systolic Velocity:  25.90 cm/s LEFT ATRIUM           Index        RIGHT ATRIUM           Index LA diam:      4.20 cm 1.92 cm/m   RA Area:     14.70 cm LA Vol (A2C): 55.3 ml 25.32 ml/m  RA Volume:   31.40 ml  14.38 ml/m LA Vol (A4C): 42.3 ml 19.37 ml/m  AORTIC VALVE LVOT Vmax:   88.60 cm/s LVOT Vmean:  57.900 cm/s LVOT VTI:    0.178 m  AORTA Ao Root diam: 3.20 cm Ao Asc diam:  2.80 cm MITRAL VALVE MV Area (PHT): 6.96 cm    SHUNTS MV Decel Time: 109 msec    Systemic VTI:  0.18 m MV E velocity: 60.30 cm/s  Systemic Diam: 2.00 cm MV A velocity: 43.00 cm/s MV E/A ratio:  1.40 Maude Emmer MD Electronically signed by Maude Emmer MD Signature Date/Time: 02/24/2024/12:05:30 PM    Final    MR BRAIN WO CONTRAST Result Date: 02/24/2024 EXAM: MRI BRAIN WITHOUT CONTRAST 02/24/2024 09:31:29 AM TECHNIQUE: Multiplanar multisequence MRI of the head/brain was performed without the administration of intravenous contrast. COMPARISON: MRI of the head dated 12/30/2023. CLINICAL HISTORY: Mental status change, unknown cause. FINDINGS: BRAIN AND VENTRICLES: There is new restricted diffusion present within the right posterior frontal lobe, consistent with an acute infarct. There are chronic encephalomalacia changes again demonstrated at the right frontoparietal junction and right temporal lobe. There is advanced diffuse periventricular and deep cerebral white matter disease. There is Wallerian degeneration of the right cerebral peduncle. There are encephalomalacia changes within the left posterior frontal lobe related to small focal subcortical  infarct. There is mild hemosiderin staining present within the right MCA distribution chronic infarct. No intracranial hemorrhage. No mass. No midline shift. No hydrocephalus. The sella is unremarkable. Normal flow voids. ORBITS: No acute abnormality. SINUSES AND MASTOIDS: No acute abnormality. BONES AND SOFT TISSUES: Normal marrow signal. No acute soft tissue abnormality. IMPRESSION: 1. New restricted diffusion in the right posterior frontal lobe, consistent with acute non-hemorrhagic infarct. 2. Chronic encephalomalacia changes at the right frontoparietal junction and right temporal lobe. 3. Advanced diffuse periventricular and deep cerebral white matter disease. 4. Wallerian degeneration of the right cerebral peduncle. 5. Encephalomalacia changes within the left posterior frontal lobe related to small focal subcortical infarct. 6. Mild hemosiderin staining within the right MCA distribution chronic infarct. Electronically signed by: Evalene Coho MD 02/24/2024 09:43 AM EST RP Workstation: [id number]   DG Abd 1 View Result Date: 02/23/2024 EXAM: 1 VIEW XRAY OF THE ABDOMEN 02/23/2024 09:31:00 PM COMPARISON: None available. CLINICAL HISTORY: Constipation FINDINGS: LINES, TUBES AND DEVICES: Left chest loop recorder device in place. BOWEL: Nonobstructive bowel gas pattern. SOFT TISSUES: Extravasated intravenous contrast noted partially in the collecting systems and the urinary bladder. No abnormal calcifications. BONES: Multilevel degenerative changes of the spine. Mild degenerative changes of the bilateral hips. No acute fracture. CHEST FINDINGS: Chronic elevation of right hemidiaphragm. IMPRESSION: 1. No acute abdominal process. Electronically signed by: Morgane Naveau MD 02/23/2024 09:44 PM EST RP Workstation: [id number]   CT ANGIO HEAD NECK W WO CM Result Date: 02/23/2024 EXAM: CTA HEAD AND NECK WITH AND WITHOUT 02/23/2024 07:00:49 PM  TECHNIQUE: CTA of the head and neck was performed with and without the  administration of intravenous contrast. Multiplanar 2D and/or 3D reformatted images are provided for review. Automated exposure control, iterative reconstruction, and/or weight based adjustment of the mA/kV was utilized to reduce the radiation dose to as low as reasonably achievable. Stenosis of the internal carotid arteries measured using NASCET criteria. COMPARISON: CT Head today CLINICAL HISTORY: Stroke, follow up FINDINGS: AORTIC ARCH AND ARCH VESSELS: No dissection or arterial injury. No significant stenosis of the brachiocephalic or subclavian arteries. CERVICAL CAROTID ARTERIES: No dissection, arterial injury, or hemodynamically significant stenosis by NASCET criteria. CERVICAL VERTEBRAL ARTERIES: Patent. Similar severe proximal right vertebral artery stenosis. LUNGS AND MEDIASTINUM: Unremarkable. SOFT TISSUES: No acute abnormality. BONES: No acute abnormality. ANTERIOR CIRCULATION: No significant stenosis of the internal carotid arteries. No significant stenosis of the anterior cerebral arteries. Similar proximal right M2 MCA branch occlusion with distal reconstitution. No aneurysm. POSTERIOR CIRCULATION: Patent PCAs.  Moderate left P2 PCA stenosis. No significant stenosis of the basilar artery. No significant stenosis of the vertebral arteries. No aneurysm. OTHER: No dural venous sinus thrombosis on this non-dedicated study. IMPRESSION: 1. Similar proximal right M2 MCA branch occlusion with distal reconstitution. 2. Similar severe proximal right vertebral artery stenosis. 3. Similar moderate left P2 PCA stenosis. Electronically signed by: Gilmore Molt MD 02/23/2024 07:14 PM EST RP Workstation: [id number]   DG Chest Portable 1 View Result Date: 02/23/2024 EXAM: 1 VIEW(S) XRAY OF THE CHEST 02/23/2024 03:53:00 PM COMPARISON: 12/30/2023 CLINICAL HISTORY: AMS FINDINGS: LUNGS AND PLEURA: Elevated right hemidiaphragm is noted. No focal pulmonary opacity. No pleural effusion. No pneumothorax. HEART AND  MEDIASTINUM: No acute abnormality of the cardiac and mediastinal silhouettes. BONES AND SOFT TISSUES: No acute osseous abnormality. IMPRESSION: 1. No acute cardiopulmonary findings. Electronically signed by: Lynwood Seip MD 02/23/2024 04:13 PM EST RP Workstation: [id number]   CT Head Wo Contrast Result Date: 02/23/2024 EXAM: CT HEAD WITHOUT CONTRAST 02/23/2024 03:12:44 PM TECHNIQUE: CT of the head was performed without the administration of intravenous contrast. Automated exposure control, iterative reconstruction, and/or weight based adjustment of the mA/kV was utilized to reduce the radiation dose to as low as reasonably achievable. COMPARISON: CT head without contrast 12/30/2023. CLINICAL HISTORY: Delirium. FINDINGS: LIMITATIONS/ARTIFACTS: The study is moderately degraded by patient motion. BRAIN AND VENTRICLES: No acute hemorrhage. No evidence of acute infarct. Remote posterior right MCA territory infarct is again noted. Remote lacunar infarcts are present within the cerebellum bilaterally. Moderate white matter changes or similar to prior study. Atherosclerotic calcifications are present in the cavernous carotid arteries bilaterally and at the dural margin of both vertebral arteries. No hyperdense vessel is present. No hydrocephalus. No extra-axial collection. No mass effect or midline shift. ORBITS: No acute abnormality. SINUSES: No acute abnormality. SOFT TISSUES AND SKULL: No acute soft tissue abnormality. No skull fracture. IMPRESSION: 1. No acute intracranial abnormality. 2. Remote posterior right MCA territory infarct and remote bilateral cerebellar lacunar infarcts. 3. Moderate white matter changes, similar to prior study. 4. Atherosclerotic calcifications in the cavernous carotid arteries bilaterally and at the dural margin of both vertebral arteries. No hyperdense vessel is present. Electronically signed by: Lonni Necessary MD 02/23/2024 03:32 PM EST RP Workstation: [id number]     PHYSICAL  EXAM  Temp:  [98 F (36.7 C)-99.1 F (37.3 C)] 98.7 F (37.1 C) (12/09 0717) Pulse Rate:  [63-74] 63 (12/09 0717) Resp:  [16-19] 19 (12/09 0717) BP: (130-153)/(55-78) 153/61 (12/09 0717) SpO2:  [92 %-96 %] 92 % (12/09 0717)  General - Well nourished, well developed, in no apparent distress.  Cardiovascular - Regular rate.  Mental Status -  Level of arousal and orientation to time, place, and person were intact. Language including expression, naming, repetition, comprehension was assessed and found intact. Attention span and concentration were normal.  Cranial Nerves II - XII - II - Visual fields testing suggest L Lower Quadrantanopia, R gaze preference III, IV, VI - Extraocular movements intact, but has difficulty crossing midline toward the L. V - Facial sensation significantly impaired on the left. VII - Facial movement significantly impaired on the left. VIII - Hearing & vestibular intact bilaterally. X - Palate elevates symmetrically. XI - Shoulder shrug weakened on the Left. XII - Tongue protrusion intact.  Motor Strength - The patient's strength was 2/5 through L arm, 2/5 L hip flexion, 4/5 L dorsiflexion. All other muscle groups 5/5. Bulk was normal and fasciculations were absent.  Sensory - Light touch, was assessed and was diminished on the L throughout with L neglect.  Coordination - The patient had normal FTN in R hand, did not have strength to test L hand. Tremor was absent.  Gait and Station - deferred.   ASSESSMENT/PLAN Mr. PANAGIOTIS OELKERS is a 89 y.o. male with history of A-fib on Eliquis  + ASA, HLD, HTN, and history of CVA w/ residual L sided weakness, CKD, mild dementia, depression, and anxiety who was admitted for stroke. No TNK given.   Recurrent stroke:  R frontal stroke, secondary to large vessel disease from chronic R M2 occlusion vs PAF even on Eliquis  + ASA  MRI: New R posterior frontal lobe, non-hemorrhagic infarct. 2D Echo: EF 60-65%, atrial normal  size EEG no seizure LDL 50 HgbA1c 5.7 UDS negative Eliquis  for VTE prophylaxis aspirin  81 mg daily and Eliquis  (apixaban ) daily prior to admission, now on aspirin  81 mg daily and Eliquis  (apixaban ) daily.  Ongoing aggressive stroke risk factor management Therapy recommendations:  HH vs. SNF Disposition: TBD  Hx of Stroke/TIA 3-06/2013 bilateral infarcts on MRI.  MRA, TTE, CUS all negative.  Discharged on aspirin  and Zocor .  30-day CardioNet monitoring negative for A-fib.  Loop recorder placed in 10/2013. 06/2014 found to have A-fib on loop recorder.  Started Xarelto  by cardiology 04/2022 admitted for right large MCA infarct with right M2 occlusion, status post IR with TICI2c.  However, post IR MRI still showing persistent M2 occlusion.  EF 60 to 65%, LDL 56, A1c 5.8.  Xarelto  and was switched to Eliquis , and continue Lipitor  20. Patient with recent hospitalization from 12/22/23-12/25/23 after presenting with difficulty speaking and right-sided facial droop found to have acute left precentral gyrus and acute lacunar infarct of the right cerebral hemisphere.  CTA head and neck right M2 more proximal occlusion with severe stenosis.  EF 60 to 65%.  LDL 78, A1c 5.6.  Pradaxa not covered by insurance, continue Eliquis  on discharge. 12/30/2023 admitted for new right frontal lobe small infarcts.  CTA head and neck similar proximal right M2 occlusion with distal reconstitution.  Similar severe right VA stenosis.  Similar left ICA 60 to 70% stenosis.  Continued Eliquis  and add aspirin  81 on top.  Continue statin. Pan CT 12/30/2023 showed no malignancy  PAF Home Meds: Eliquis  Continue telemetry monitoring Continue anticoagulation with Eliquis   Amiodarone  and cardizem    Hypertension Stable Avoid low BP given R M2 occlusion Orthostatic vitals no orthostatic hypotension Long term BP goal 130-150 given right M2 occlusion  Hyperlipidemia Home meds:  Atorvastatin  80mg  daily LDL 50,  goal < 70 Now on  Atorvastatin  80mg  daily Continue statin at discharge  Dysphagia Poststroke dysphagia Speech on board Now on dysphagia 1 and nectar thick liquid Aspiration precautions  Other Stroke Risk Factors Advanced age Hx stroke/TIA  Other Active Problems CKD 2, creatinine 1.19  Hospital day # 2  Neurology will continue to follow.  CHARM Penne Mori, DO, PGY1 Neurology Stroke Team 02/26/2024 11:51 AM  ATTENDING NOTE: I reviewed above note and agree with the assessment and plan. Pt was seen and examined.   No family at bedside.  Patient lying in bed, awake, alert, mildly lethargic, still has right gaze preference, and left facial droop and left hemianopia.  Left sided weakness seems worsened from yesterday but dysarthria seems improved from yesterday.  Continue Eliquis  and aspirin  as well as statin.  Will need PT and OT reevaluation tomorrow.  Will follow  For detailed assessment and plan, please refer to above as I have made changes wherever appropriate.   Ary Cummins, MD PhD Stroke Neurology 02/26/2024 6:23 PM    To contact Stroke Continuity provider, please refer to [email or web address]. After hours, contact General Neurology

## 2024-02-26 NOTE — Consult Note (Cosign Needed)
 Palliative Care Consult Note                                  Date: 02/26/2024   Patient Name: Edward Maynard  DOB:1933/04/11  FMW:[id number]  Age / Sex:89 y.o., male  PCP: Sherre Rea, FNP Referring Physician: Arlice Reichert, MD  Reason for Consultation: Establishing goals of care  Past Medical History:  Diagnosis Date   High cholesterol    05/2013; no defecits   Hyperlipemia    Left arm weakness    Left hand weakness    Personal history of colonic polyp - adenoma  10/23/2013   Stroke (HCC) 05/2013   MRI on 07/02/13 = Multifocal acute & subacute infarction involving right frontal MCA/ACA & left parietal MCA/PCA watershed areas     Assessment & Plan:   HPI/Patient Profile: 89 y.o. male  with past medical history of CVA with left sided deficits, dementia, A-fib on anticoagulation admitted on 02/23/2024 with acute infarct and acute encephalopathy.   Per Emergency Medicine note by Ula MD on 02/23/2024 patient presented with altered mental status and new left side neglect with known left side deficit at baseline.   Per progress note on 02/25/2024 by Dahal MD, MRI brain on 02/24/2024 demonstrated new restricted diffusion in the right posterior frontal lobe consistent with acute non-hemorrhagic infarct and chronic right MCA infarct. Multiple strokes in 04/2023, 12/2023, and 02/2024 with continued overall deterioration.   Palliative medicine consulted for goals of care conversation.   SUMMARY OF RECOMMENDATIONS   DNR-comfort Monitor next 48 hours to assess placement for patient Family considering returning to ALF with hospice, SNF with hospice, or inpatient hospice facility Family will reach out to [facility] for clarification for level of care they can provide for the patient  Symptom Management:  Morphine  pain/dyspnea/increased work of breathing/RR>25 Tylenol  PRN pain/fever Biotin PRN daily Robinul  PRN secretions Haldol  PRN  agitation/delirium Ativan  PRN anxiety/seizure/sleep/distress Zofran  PRN nausea/vomiting Liquifilm Tears PRN dry eyes  Code Status: DNR - Comfort  Prognosis:  < 6 months  Discharge Planning:  To Be Determined   Discussed with: Dahal MD about family's wishes to pursue full comfort measures at this time due to continued deterioration.   Subjective:   Reviewed medical records, received report from team, assessed the patient and then meet at the patient's bedside to discuss diagnosis, prognosis, GOC, EOL wishes disposition and options.  I met with patient at bedside with daughter [name]) and joined by son [name]) by phone.   We meet to discuss diagnosis prognosis, GOC, EOL wishes, disposition and options. Concept of Palliative Care was introduced as specialized medical care for people and their families living with serious illness.  If focuses on providing relief from the symptoms and stress of a serious illness.  The goal is to improve quality of life for both the patient and the family. Values and goals of care important to patient and family were attempted to be elicited.  Created space and opportunity for patient  and family to explore thoughts and feelings regarding current medical situation   Natural trajectory and current clinical status were discussed. Questions and concerns addressed. Patient encouraged to call with questions or concerns.    Patient/Family Understanding of Illness: - Patient understands that he had a stroke which has been devastating to him, he knows that he is more debilitated after the stroke, also aware that he has had prior strokes, all of  this has resulted on serious debilitation - Family also has a good understanding of the patient's recurrent strokes, and they have seen that he has become continually debilitated since his initial stroke in February 2025  Life Review: - Married to [employer] and has 2 children - Patient reported that he was a wellsite geologist for [employer]  - He enjoyed playing golf in his spare time and was an athlete in the past playing multiple sports - Has a daughter [name]) who lives local and a son [name]) who lives [place]  but handles the finances for the patient  Patient Values - Patient values being active and independent per family discussion  Baseline Status: - Lives at [facility] assisted living facility with his wife Melanee) - Patient reported that at baseline he was able to feed himself, but since his most recent stroke he has needed to be fed -   Today's Discussion: - Discussed discussed overall current stroke and prognosis as well as next steps regarding continuing aggressive measures to prolong life versus transitioning to comfort measures to focus on keeping the patient comfortable and free of pain and anxiety - Daughter and son shares that patient has been expressing to them that he does not know why God continues to keep him here, and other expressions of being tired of continuing in the current state that he is not after his multiple strokes which has severely debilitated his ability to care for himself and perform activities of daily living - Family shared that the patient has not been thriving ever since his initial stroke in February and that although he shares with them that he wants to participate in rehab and physical therapy, he ultimately does not end up fully participating and has continued to deteriorate - Family were realistic and asked what the overall prognosis is and that they do not think continued aggressive interventions to prolong his life is within the goals of the family and the patient's expressed wishes in the past - Shared with family that if they are ready to transition to full comfort measures it would mean fully focusing on the comforts of the patient while discontinuing other life-prolonging measures including some of the medications that he is on and to no longer pursue  aggressive workup if there are clinical changes and the patient, and that no more labs will be drawn, knowing that time is short for the patient - Full comfort measures would mean allowing the patient to eat what ever he wants knowing that he could also aspirate, but that the focus is for comfort and we are no longer concerned with treating infections or giving IV fluids in order to maintain adequate nutrition - Discussed placement with family and the options provided were to either return to [name] assisted living facility with hospice in place, pursuing skilled nursing facility placement with hospice in place, or finally inpatient hospice facility if the patient were to meet the criteria - Family shared that they were concerned about [name] assisted living facilities capabilities to care for their father's needs at this stage given that he now requires more assistance with activities of daily living and that it is not a skilled nursing facility that may be able to provide adequate care for their father, but they will investigate and see what the assisted living facility can offer - Discussed with family that at this point patient may not meet inpatient hospice criteria given that he is still robust in some ways but that  there needs to be more time to allow for clearer trajectory which will help with placement - Family opted to pursue full comfort measures at this time  Review of Systems  Constitutional:  Positive for appetite change and fatigue.    Objective:   Primary Diagnoses: Present on Admission:  Prolonged QT interval  Acute encephalopathy  (Resolved) Acute encephalopathy  Chronic a-fib (HCC)  Essential hypertension  Paroxysmal atrial fibrillation (HCC)  Hyperlipidemia  CKD (chronic kidney disease)  Cough   Vital Signs:  BP (!) 153/61 (BP Location: Left Arm)   Pulse 63   Temp 98.7 F (37.1 C) (Oral)   Resp 19   Ht 6' 0.01 (1.829 m)   Wt 96.2 kg   SpO2 92%    BMI 28.76 kg/m   Physical Exam Constitutional:      Appearance: He is ill-appearing.  HENT:     Head: Normocephalic.     Nose: Nose normal.  Eyes:     Extraocular Movements: Extraocular movements intact.  Cardiovascular:     Rate and Rhythm: Normal rate.  Pulmonary:     Effort: Pulmonary effort is normal.  Abdominal:     Palpations: Abdomen is soft.  Neurological:     Mental Status: He is alert. Mental status is at baseline.  Psychiatric:        Mood and Affect: Mood normal.    Palliative Assessment/Data: 40%    Thank you for allowing us  to participate in the care of CHASE ARNALL PMT will continue to support holistically.  I personally spent a total of 75 minutes in the care of the patient today including preparing to see the patient, getting/reviewing separately obtained history, performing a medically appropriate exam/evaluation, counseling and educating, placing orders, referring and communicating with other health care professionals, and documenting clinical information in the EHR.  Signed by: Fairy FORBES Shan DEVONNA Palliative Medicine Team  Team Phone # [phone] (Nights/Weekends)  02/26/2024, 10:13 AM

## 2024-02-26 NOTE — Plan of Care (Signed)
  Problem: Clinical Measurements: Goal: Respiratory complications will improve Outcome: Progressing Goal: Cardiovascular complication will be avoided Outcome: Progressing   Problem: Activity: Goal: Risk for activity intolerance will decrease Outcome: Progressing   Problem: Nutrition: Goal: Adequate nutrition will be maintained Outcome: Progressing   Problem: Elimination: Goal: Will not experience complications related to bowel motility Outcome: Progressing Goal: Will not experience complications related to urinary retention Outcome: Progressing   Problem: Skin Integrity: Goal: Risk for impaired skin integrity will decrease Outcome: Progressing   Problem: Coping: Goal: Will identify appropriate support needs Outcome: Progressing   Problem: Nutrition: Goal: Risk of aspiration will decrease Outcome: Progressing

## 2024-02-27 DIAGNOSIS — Z789 Other specified health status: Secondary | ICD-10-CM

## 2024-02-27 DIAGNOSIS — I129 Hypertensive chronic kidney disease with stage 1 through stage 4 chronic kidney disease, or unspecified chronic kidney disease: Secondary | ICD-10-CM | POA: Diagnosis not present

## 2024-02-27 DIAGNOSIS — I480 Paroxysmal atrial fibrillation: Secondary | ICD-10-CM | POA: Diagnosis not present

## 2024-02-27 DIAGNOSIS — I63511 Cerebral infarction due to unspecified occlusion or stenosis of right middle cerebral artery: Secondary | ICD-10-CM | POA: Diagnosis not present

## 2024-02-27 DIAGNOSIS — Z66 Do not resuscitate: Secondary | ICD-10-CM | POA: Diagnosis not present

## 2024-02-27 DIAGNOSIS — I779 Disorder of arteries and arterioles, unspecified: Secondary | ICD-10-CM | POA: Diagnosis not present

## 2024-02-27 DIAGNOSIS — G9340 Encephalopathy, unspecified: Secondary | ICD-10-CM | POA: Diagnosis not present

## 2024-02-27 NOTE — Plan of Care (Signed)
  Problem: Education: Goal: Knowledge of General Education information will improve Description: Including pain rating scale, medication(s)/side effects and non-pharmacologic comfort measures Outcome: Progressing   Problem: Health Behavior/Discharge Planning: Goal: Ability to manage health-related needs will improve Outcome: Progressing   Problem: Clinical Measurements: Goal: Ability to maintain clinical measurements within normal limits will improve Outcome: Progressing Goal: Will remain free from infection Outcome: Progressing Goal: Diagnostic test results will improve Outcome: Progressing Goal: Respiratory complications will improve Outcome: Progressing Goal: Cardiovascular complication will be avoided Outcome: Progressing   Problem: Activity: Goal: Risk for activity intolerance will decrease Outcome: Progressing   Problem: Nutrition: Goal: Adequate nutrition will be maintained Outcome: Progressing   Problem: Coping: Goal: Level of anxiety will decrease Outcome: Progressing   Problem: Elimination: Goal: Will not experience complications related to bowel motility Outcome: Progressing Goal: Will not experience complications related to urinary retention Outcome: Progressing   Problem: Pain Managment: Goal: General experience of comfort will improve and/or be controlled Outcome: Progressing   Problem: Safety: Goal: Ability to remain free from injury will improve Outcome: Progressing   Problem: Skin Integrity: Goal: Risk for impaired skin integrity will decrease Outcome: Progressing   Problem: Education: Goal: Knowledge of disease or condition will improve Outcome: Progressing Goal: Knowledge of secondary prevention will improve (MUST DOCUMENT ALL) Outcome: Progressing Goal: Knowledge of patient specific risk factors will improve (DELETE if not current risk factor) Outcome: Progressing   Problem: Ischemic Stroke/TIA Tissue Perfusion: Goal: Complications of  ischemic stroke/TIA will be minimized Outcome: Progressing   Problem: Coping: Goal: Will verbalize positive feelings about self Outcome: Progressing Goal: Will identify appropriate support needs Outcome: Progressing   Problem: Health Behavior/Discharge Planning: Goal: Ability to manage health-related needs will improve Outcome: Progressing Goal: Goals will be collaboratively established with patient/family Outcome: Progressing   Problem: Self-Care: Goal: Ability to participate in self-care as condition permits will improve Outcome: Progressing Goal: Verbalization of feelings and concerns over difficulty with self-care will improve Outcome: Progressing Goal: Ability to communicate needs accurately will improve Outcome: Progressing   Problem: Nutrition: Goal: Risk of aspiration will decrease Outcome: Progressing Goal: Dietary intake will improve Outcome: Progressing   Problem: Education: Goal: Knowledge of the prescribed therapeutic regimen will improve Outcome: Progressing   Problem: Coping: Goal: Ability to identify and develop effective coping behavior will improve Outcome: Progressing   Problem: Clinical Measurements: Goal: Quality of life will improve Outcome: Progressing   Problem: Respiratory: Goal: Verbalizations of increased ease of respirations will increase Outcome: Progressing   Problem: Role Relationship: Goal: Family's ability to cope with current situation will improve Outcome: Progressing Goal: Ability to verbalize concerns, feelings, and thoughts to partner or family member will improve Outcome: Progressing   Problem: Pain Management: Goal: Satisfaction with pain management regimen will improve Outcome: Progressing

## 2024-02-27 NOTE — Progress Notes (Signed)
 PROGRESS NOTE  Edward Maynard  DOB: 1934-01-08  PCP: Sherre Rea, FNP FMW:[id number]  DOA: 02/23/2024  LOS: 3 days  Hospital Day: 5  Subjective: Patient was seen and examined this morning. Propped up in bed.  No acute distress.  Alert, awake, oriented to place and person. Daughter not at bedside today.  Currently on comfort care status since 12/9 Afebrile, heart rate 60s and 70s, blood pressure in a wide range from 100s to 150s,  Brief narrative: Edward Maynard is a 89 y.o. male with PMH significant for HTN, HLD, A-fib on Eliquis , h/o CVA with residual left-sided weakness, mild dementia, anxiety, depression. Patient lives at [facility] assisted living facility Has residual left-sided weakness from prior stroke but at baseline, patient is alert, oriented and able to feed himself. In February 2025, he had a new stroke and at the time he was switched from Xarelto  to Eliquis . Recently hospitalized twice in October, both times for new neurological symptoms. MRI brain done on 10/5 and 10/12 both showed new infarcts.  Ultimate recommendation from neurology was to continue Eliquis  and an additional aspirin  81 mg daily indefinitely.    12/6, patient presented to the ED with complaint of altered mental status, left-sided hemineglect.  His daughter noted him to more lethargic than baseline with the speech more slurred  In the ED, afebrile, hemodynamically stable, breathing on room air Seen by neurology Patient was felt not to be a good candidate for intervention and he was already on maximal therapy such as Eliquis  CT head nonacute showed remote posterior right MCA CTA head similar to prior proximal right M2 MCA branch occlusion with distal reconstitution similar severe proximal right vertebral artery stenosis and moderate left P2 PCA stenosis Admitted to [facility]  MRI brain showed a new new restricted diffusion in the right posterior frontal lobe, consistent with acute non-hemorrhagic  infarct. Neurology consulted.  Patient had to be medically optimized with aspirin  and Eliquis  already and hence no change was made.. Palliative care was consulted. 12/9, after discussion with family, patient was transitioned to comfort care status  Assessment and plan: Comfort care status Comfort care orders initiated on 12/9 after palliative care discussion with family Disposition planning process  Acute nonhemorrhagic infarct of the right posterior frontal lobe Multiple prior strokes Patient had multiple prior strokes including 2 recent strokes 2 months ago  Chronically on Eliquis  and aspirin  Brought in for altered mentation, slurred speech, worsening weakness CT head and CTA head and neck did not show any new finding MRI brain showed an acute infarct as above. Stroke workup was ordered. Echo showed EF of 60 to 65%, no WMA, G1 DD,, no intracardiac source of embolism A1c 5.7 on 02/25/2024 HDL low at 36, LDL 50 Continue telemetry, neurochecks PT/OT/ST eval obtained.  Home health PT recommended PTA meds-medical optimized with Eliquis  and aspirin .  Continue the same Palliative care consulted  Acute encephalopathy Other etiologies of AMS were considered as well but urinalysis unremarkable for infection, U tox negative.  Respiratory panel negative.  Ammonia level not elevated. Per H&P, by the time of admission, neurological exam was nonfocal but patient unable to cooperate fully  12/8, EEG did not show any active epileptiform activity Mental status seems stable. Current alert, awake, able to answer simple questions.  Dysphagia  worsening cough 12/7, SLP eval obtained.  Moderate aspiration risk recommended ice chips as needed and n.p.o.  To be seen by speech again.  Family seem to be willing to accept the risk and  feed him.   A-fib with RVR  Chronic a-fib  12/7, patient had a short lasting episode of A-fib with RVR. Metoprolol  IV was planned but he converted back to normal sinus  rhythm before it could be given PTA meds- amiodarone  100 mg daily, Cardizem  120 mg daily Currently continued on the same.   Chronically anticoagulated with Eliquis  5 mg p.o. twice daily   Essential hypertension Allow permissive hypertension No orthostatic blood pressure drop noted.  H/o moderate AS Noted in echo from October.  Unable to reproduce the measurements on echo this time. Follow-up with cardiology as an outpatient  HLD Continue Lipitor   Prolonged QT interval Admission EKG with QTc 570 ms.  Repeat EKG today with QTc 440 ms Continue monitor electrolytes and avoid QTc prolonging medications    Mild dementia Anxiety/depression PTA meds- Prozac , Remeron , trazodone , Atarax  as needed, Continue as before   Nutrition Status:        PT Orders: Active   PT Follow up Rec: Home Health Pt (At Alf)02/25/2024 1600    Goals of care   Code Status: Do not attempt resuscitation (DNR) - Comfort care     DVT prophylaxis:   apixaban  (ELIQUIS ) tablet 5 mg   Antimicrobials: None Fluid: None Consultants: Neurology, palliative care Family Communication: Daughter [name] not at bedside today  Status: Observation Level of care:  Telemetry   Patient is from: Home Needs to continue in-hospital care: Stroke workup completed.  Comfort care since 12/9. Disposition in process   Diet:  Diet Order             DIET - DYS 1 Room service appropriate? Yes with Assist; Fluid consistency: Nectar Thick  Diet effective now                   Scheduled Meds:  amiodarone   100 mg Oral Daily   apixaban   5 mg Oral BID   diltiazem   120 mg Oral Daily   FLUoxetine   20 mg Oral Daily   food thickener  10 packet Oral Once   mirtazapine   7.5 mg Oral QHS   traZODone   100 mg Oral QHS    PRN meds: acetaminophen  **OR** acetaminophen  (TYLENOL ) oral liquid 160 mg/5 mL **OR** acetaminophen , antiseptic oral rinse, artificial tears, bisacodyl , glycopyrrolate  **OR** glycopyrrolate  **OR**  glycopyrrolate , guaiFENesin -dextromethorphan , haloperidol  **OR** haloperidol  **OR** haloperidol  lactate, LORazepam  **OR** LORazepam  **OR** LORazepam , morphine  CONCENTRATE **OR** morphine  CONCENTRATE, ondansetron  **OR** ondansetron  (ZOFRAN ) IV, senna   Infusions:     Antimicrobials: Anti-infectives (From admission, onward)    None       Objective: Vitals:   02/27/24 0730 02/27/24 1110  BP: (!) 154/58 (!) 106/90  Pulse: 70 68  Resp: 16 16  Temp: 97.7 F (36.5 C) 97.8 F (36.6 C)  SpO2: 92% 95%    Intake/Output Summary (Last 24 hours) at 02/27/2024 1239 Last data filed at 02/27/2024 0600 Gross per 24 hour  Intake 600 ml  Output 350 ml  Net 250 ml   Filed Weights   02/23/24 2244  Weight: 96.2 kg   Weight change:  Body mass index is 28.76 kg/m.   Physical Exam: General exam: Pleasant, elderly Caucasian male.  Not in distress.  Comfortable. Skin: No rashes, lesions or ulcers. HEENT: Atraumatic, normocephalic, no obvious bleeding Lungs: Clear to auscultation bilaterally,  CVS: S1, S2, no murmur,   GI/Abd: Soft, nontender, nondistended, bowel sound present,   CNS:  Alert, awake, oriented t to place and person. Psychiatry: Mood appropriate Extremities: No pedal edema,  no calf tenderness,   Data Review: I have personally reviewed the laboratory data and studies available.  F/u labs ordered Unresulted Labs (From admission, onward)    None       Signed, Chapman Rota, MD [facility] 02/27/2024

## 2024-02-27 NOTE — Progress Notes (Signed)
 Daily Progress Note   Date: 02/27/2024   Patient Name: Edward Maynard  DOB: 12-27-1933  MRN: [id number]  Age / Sex: 89 y.o., male  Attending Physician: Arlice Reichert, MD Primary Care Physician: Sherre Rea, FNP Admit Date: 02/23/2024 Length of Stay: 3 days  Reason for Follow-up: Establishing goals of care  Past Medical History:  Diagnosis Date   High cholesterol    05/2013; no defecits   Hyperlipemia    Left arm weakness    Left hand weakness    Personal history of colonic polyp - adenoma  10/23/2013   Stroke (HCC) 05/2013   MRI on 07/02/13 = Multifocal acute & subacute infarction involving right frontal MCA/ACA & left parietal MCA/PCA watershed areas    Assessment & Plan:   HPI/Patient Profile:   89 y.o. male  with past medical history of CVA with left sided deficits, dementia, A-fib on anticoagulation admitted on 02/23/2024 with acute infarct and acute encephalopathy.    Per Emergency Medicine note by Ula MD on 02/23/2024 patient presented with altered mental status and new left side neglect with known left side deficit at baseline.    Per progress note on 02/25/2024 by Dahal MD, MRI brain on 02/24/2024 demonstrated new restricted diffusion in the right posterior frontal lobe consistent with acute non-hemorrhagic infarct and chronic right MCA infarct. Multiple strokes in 04/2023, 12/2023, and 02/2024 with continued overall deterioration.    Palliative medicine consulted for goals of care conversation.   SUMMARY OF RECOMMENDATIONS DNR-comfort Monitor next 48 hours to assess placement for patient Family considering returning to ALF with hospice, SNF with hospice, or inpatient hospice facility Family will reach out to [facility] for clarification for level of care they can provide for the patient  Symptom Management:  Morphine  pain/dyspnea/increased work of breathing/RR>25 Tylenol  PRN pain/fever Biotin PRN daily Robinul  PRN secretions Haldol  PRN agitation/delirium Ativan  PRN  anxiety/seizure/sleep/distress Zofran  PRN nausea/vomiting Liquifilm Tears PRN dry eyes  Code Status: DNR - Comfort  Prognosis: < 3 months  Discharge Planning: To Be Determined  Discussed with: Dahal MD 02/27/2024 about the patient's possible disposition but necessity for more time to project placement.   Subjective:   Subjective: Chart Reviewed. Updates received. Patient Assessed. Created space and opportunity for patient  and family to explore thoughts and feelings regarding current medical situation.  Patient did not receive any PRN comfort medications. Patient continues to have adequate intake at this time.   Today's Discussion:  Met with patient at the bedside without any visitors. Patient was sleeping but awakes to voice. Patient just finished breakfast during visit but stated he required assistance from nursing staff to eat. Patient will continue to need assistance with feeding given his new CVA with new deficits.  Review of Systems  All other systems reviewed and are negative.   Objective:   Primary Diagnoses: Present on Admission:  Prolonged QT interval  Acute encephalopathy  (Resolved) Acute encephalopathy  Chronic a-fib (HCC)  Essential hypertension  Paroxysmal atrial fibrillation (HCC)  Hyperlipidemia  CKD (chronic kidney disease)  Cough   Vital Signs:  BP 138/68 (BP Location: Right Arm)   Pulse 68   Temp 97.8 F (36.6 C)   Resp 16   Ht 6' 0.01 (1.829 m)   Wt 96.2 kg   SpO2 94%   BMI 28.76 kg/m   Physical Exam Constitutional:      Appearance: He is ill-appearing.  HENT:     Head: Normocephalic.     Nose: Nose normal.  Mouth/Throat:     Mouth: Mucous membranes are dry.  Eyes:     Extraocular Movements: Extraocular movements intact.     Comments: Left side neglect.   Cardiovascular:     Pulses: Normal pulses.  Pulmonary:     Effort: Pulmonary effort is normal.  Abdominal:     Palpations: Abdomen is soft.  Skin:    General: Skin is  warm and dry.  Neurological:     Mental Status: He is alert. Mental status is at baseline.  Psychiatric:        Mood and Affect: Mood normal.    Palliative Assessment/Data: 30%   Existing Vynca/ACP Documentation: HCPOA and AD received from family' MOST form completed - DNR, comfort measures, no antibiotics, no IV fluids, no feeding tube  Thank you for allowing us  to participate in the care of Edward Maynard PMT will continue to support holistically.  I personally spent a total of 25 minutes in the care of the patient today including preparing to see the patient, getting/reviewing separately obtained history, performing a medically appropriate exam/evaluation, counseling and educating, referring and communicating with other health care professionals, and documenting clinical information in the EHR.   Fairy FORBES Shan DEVONNA  Palliative Medicine Team  Team Phone # [phone] (Nights/Weekends) 02/27/2024 4:54 PM

## 2024-02-27 NOTE — Progress Notes (Signed)
 Respectfully, due to patient being on comfort care measures the Important Message Letter is being mailed to patient.

## 2024-02-27 NOTE — Progress Notes (Signed)
 Pt requested by Pt placement to be transferred to 5N, Report called and given to Nurse Fadila in 5N, pt's daughter [name]) also called and notified of pt's transfer request. Pt taken down at this time. Obasogie-Asidi, Kiante Petrovich Efe

## 2024-02-27 NOTE — Care Management Important Message (Signed)
 Important Message  Patient Details  Name: Edward Maynard MRN: [id number] Date of Birth: 12-Oct-1933   Important Message Given:  No     [name] 02/27/2024, 3:10 PM

## 2024-02-27 NOTE — TOC Progression Note (Addendum)
 Transition of Care [facility]) - Progression Note    Patient Details  Name: Edward Maynard MRN: [id number] Date of Birth: 01-Oct-1933  [facility] CM/SW Contact  Bridget Cordella Simmonds, LCSW Phone Number: 02/27/2024, 2:26 PM  Clinical Narrative: Message from Hannah/[facility]: She was called by [name] for possible hospice referral.     1400: CSW spoke briefly to daughter [name].  She is 2nd grade teacher, in the middle of dismissal, will call back.   1430: TC Wendy.  She would like pt to return to [name] with hospice services.  Discussed choice, she will speak with [name] to see if they do work with [facility] and discuss agency options.    1535: CSW spoke with Precious/[name].  Pt daughter [name] is there now talking with management about pt return.  She will call back with update.  Informed her that pt is needing more assist than before, per PT note.   1550: CSW spoke with Anna/[name], [phone].  They can accept pt back with hospice services.  Need FL2 and DC summary emailed to: [email or web address].  They do work with [facility] hospice.    1610: TC Chyrl Rung, son.  [phone].  He had detailed questions about pt return to [name] and whether they had enough information to be able to say they could meet his medical needs there.  Particular concern about eating/swallowing.  He is requesting call from Dr Arlice, who was notified.   Expected Discharge Plan: Assisted Living Barriers to Discharge: Continued Medical Work up               Expected Discharge Plan and Services     Post Acute Care Choice: NA Living arrangements for the past 2 months: Assisted Living Facility                                       Social Drivers of Health (SDOH) Interventions SDOH Screenings   Food Insecurity: No Food Insecurity (02/24/2024)  Housing: Low Risk  (02/24/2024)  Transportation Needs: No Transportation Needs  (02/24/2024)  Utilities: Not At Risk (02/24/2024)  Depression (PHQ2-9): Low Risk  (11/10/2022)  Social Connections: Patient Declined (02/24/2024)  Tobacco Use: Medium Risk (02/23/2024)    Readmission Risk Interventions     No data to display

## 2024-02-27 NOTE — Progress Notes (Addendum)
 STROKE TEAM PROGRESS NOTE   SUBJECTIVE (INTERVAL HISTORY)  Pt has transitioned to comfort cares. He remains oriented to person, place, time and has been instructed on how to get a hold of a nurse in the event that he needs any help. Is resting comfortably.  OBJECTIVE Temp:  [97.7 F (36.5 C)-99.5 F (37.5 C)] 97.8 F (36.6 C) (12/10 1110) Pulse Rate:  [60-70] 68 (12/10 1110) Resp:  [16-20] 16 (12/10 1110) BP: (105-154)/(50-90) 106/90 (12/10 1110) SpO2:  [91 %-95 %] 95 % (12/10 1110)  Recent Labs  Lab 02/23/24 1428  GLUCAP 99   Recent Labs  Lab 02/23/24 1354 02/23/24 2150 02/24/24 0610  NA 140  --  139  K 3.8  --  3.9  CL 105  --  105  CO2 27  --  24  GLUCOSE 95  --  118*  BUN 12  --  11  CREATININE 1.21  --  1.19  CALCIUM  9.2  --  8.8*  MG  --  1.8  --   PHOS  --  3.2  --    Recent Labs  Lab 02/23/24 1354 02/24/24 0610  AST 20 17  ALT 28 24  ALKPHOS 128* 115  BILITOT 0.7 1.0  PROT 7.0 6.2*  ALBUMIN 3.6 3.3*   Recent Labs  Lab 02/23/24 1354 02/24/24 0610  WBC 8.7 7.4  HGB 14.5 13.7  HCT 43.5 40.4  MCV 93.3 92.2  PLT 162 149*   Recent Labs  Lab 02/23/24 2150  CKTOTAL 57   No results for input(s): LABPROT, INR in the last 72 hours. No results for input(s): COLORURINE, LABSPEC, PHURINE, GLUCOSEU, HGBUR, BILIRUBINUR, KETONESUR, PROTEINUR, UROBILINOGEN, NITRITE, LEUKOCYTESUR in the last 72 hours.  Invalid input(s): APPERANCEUR      Component Value Date/Time   CHOL 103 02/24/2024 0610   TRIG 84 02/24/2024 0610   HDL 36 (L) 02/24/2024 0610   CHOLHDL 2.9 02/24/2024 0610   VLDL 17 02/24/2024 0610   LDLCALC 50 02/24/2024 0610   Lab Results  Component Value Date   HGBA1C 5.7 (H) 02/25/2024      Component Value Date/Time   LABOPIA NONE DETECTED 02/23/2024 1650   COCAINSCRNUR NONE DETECTED 02/23/2024 1650   LABBENZ NONE DETECTED 02/23/2024 1650   AMPHETMU NONE DETECTED 02/23/2024 1650   THCU NONE DETECTED 02/23/2024  1650   LABBARB NONE DETECTED 02/23/2024 1650    No results for input(s): ETH in the last 168 hours.  I have personally reviewed the radiological images below and agree with the radiology interpretations.  DG Swallowing Func-Speech Pathology Result Date: 02/25/2024 Table formatting from the original result was not included. Modified Barium Swallow Study Patient Details Name: Edward Maynard MRN: [id number] Date of Birth: 08-06-1933 Today's Date: 02/25/2024 HPI/PMH: HPI: Edward Maynard is a 89 y.o. male who presented to the ED from [name] assisted [facility] with complaints of altered mental status, left-sided hemineglect.  MRI remarkable for restricted diffusion in the right posterior frontal lobe, consistent  with acute non-hemorrhagic infarct. CXR negative for acute findings.  Pt was most recently seen for a clinical swallow evaluation on 10/12 with recommendations for Dysphagia 3 solids and thin liquids. PMH significant for HTN, HLD, A-fib on Eliquis , h/o CVA with residual left-sided weakness, mild dementia, anxiety, depression. 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  Start a dysphagia 1 diet; small sips thin liquids.  Must be upright for meals.  Meds whole with puree.  Pt with significant sensorimotor deficits  that impact ability to control material in his mouth and direct its movement into the pharynx.  There was poor awareness of significant residue that was trapped in the left cheek.  There was relatively good pharyngeal strength with only trace residue after the swallow.  Aspiration occurred x2 when thin liquids prematurely spilled into the larynx before it could fully close.  Pt had a delayed, weak cough in response to aspiration. He did not aspirate nectar or honey thick liquids.   Will need to supervise pt closely with meals;  there are several risk factors present that make him susceptible to developing an aspiration pna. SLP will follow.  Factors that may  increase risk of adverse event in presence of aspiration Noe & Lianne 2021): Factors that may increase risk of adverse event in presence of aspiration Noe & Lianne 2021): Limited mobility; Frail or deconditioned; Weak cough Recommendations/Plan: Swallowing Evaluation Recommendations Swallowing Evaluation Recommendations Recommendations: PO diet PO Diet Recommendation: Dysphagia 1 (Pureed); Thin liquids (Level 0) Liquid Administration via: Cup Medication Administration: Whole meds with puree Supervision: Full assist for feeding Swallowing strategies  : Slow rate; Small bites/sips; Check for pocketing or oral holding Postural changes: Position pt fully upright for meals Oral care recommendations: Oral care BID (2x/day) Caregiver Recommendations: Remove water pitcher; Have oral suction available Treatment Plan Treatment Plan Treatment recommendations: Therapy as outlined in treatment plan below Follow-up recommendations: Skilled nursing-short term rehab (<3 hours/day) Functional status assessment: Patient has had a recent decline in their functional status and demonstrates the ability to make significant improvements in function in a reasonable and predictable amount of time. Treatment frequency: Min 2x/week Interventions: Patient/family education; Trials of upgraded texture/liquids; Diet toleration management by SLP; Respiratory muscle strength training Recommendations Recommendations for follow up therapy are one component of a multi-disciplinary discharge planning process, led by the attending physician.  Recommendations may be updated based on patient status, additional functional criteria and insurance authorization. Assessment: Orofacial Exam: Orofacial Exam Oral Cavity: Oral Hygiene: Pooled secretions (left mouth) Oral Cavity - Dentition: Edentulous Oral Motor/Sensory Function: Suspected cranial nerve impairment CN V - Trigeminal: Left motor impairment; Left sensory impairment CN VII - Facial: Left  motor impairment CN IX - Glossopharyngeal, CN X - Vagus: Left motor impairment Anatomy: No data recorded Boluses Administered: Boluses Administered Boluses Administered: Thin liquids (Level 0); Mildly thick liquids (Level 2, nectar thick); Moderately thick liquids (Level 3, honey thick); Puree; Solid  Oral Impairment Domain: Oral Impairment Domain Lip Closure: Escape progressing to mid-chin Tongue control during bolus hold: Not tested Location of oral residue : Tongue; Floor of mouth; Lateral sulci Initiation of pharyngeal swallow : Valleculae  Pharyngeal Impairment Domain: Pharyngeal Impairment Domain Soft palate elevation: No bolus between soft palate (SP)/pharyngeal wall (PW) Laryngeal elevation: Partial superior movement of thyroid  cartilage/partial approximation of arytenoids to epiglottic petiole Anterior hyoid excursion: Partial anterior movement Epiglottic movement: Complete inversion Laryngeal vestibule closure: Complete, no air/contrast in laryngeal vestibule Pharyngeal stripping wave : Present - diminished Pharyngeal contraction (A/P view only): N/A Pharyngoesophageal segment opening: Partial distention/partial duration, partial obstruction of flow Tongue base retraction: Narrow column of contrast or air between tongue base and PPW Pharyngeal residue: Trace residue within or on pharyngeal structures Location of pharyngeal residue: Valleculae  Esophageal Impairment Domain: No data recorded Pill: Pill Consistency administered: -- (NT) Penetration/Aspiration Scale Score: Penetration/Aspiration Scale Score 1.  Material does not enter airway: Mildly thick liquids (Level 2, nectar thick); Moderately thick liquids (Level 3, honey thick); Puree 7.  Material enters  airway, passes BELOW cords and not ejected out despite cough attempt by patient: Thin liquids (Level 0) (x2) Compensatory Strategies: Compensatory Strategies Compensatory strategies: Yes Straw: Ineffective (unable to sip through straw) Ineffective  Straw: Thin liquid (Level 0); Mildly thick liquid (Level 2, nectar thick)   General Information: Caregiver present: No  Diet Prior to this Study: NPO   Temperature : Normal   Respiratory Status: WFL   Supplemental O2: None (Room air)   History of Recent Intubation: No  Behavior/Cognition: Alert; Cooperative; Requires cueing No data recorded Baseline vocal quality/speech: Dysphonic Volitional Cough: Able to elicit Volitional Swallow: Able to elicit Exam Limitations: Limited visibility Goal Planning: Prognosis for improved oropharyngeal function: Good No data recorded No data recorded Patient/Family Stated Goal: To eat/drink No data recorded Pain: Pain Assessment Pain Assessment: No/denies pain Breathing: 0 Negative Vocalization: 0 Facial Expression: 0 Body Language: 0 Consolability: 0 PAINAD Score: 0 End of Session: Start Time:SLP Start Time (ACUTE ONLY): 0836 Stop Time: SLP Stop Time (ACUTE ONLY): 0856 Time Calculation:SLP Time Calculation (min) (ACUTE ONLY): 20 min Charges: SLP Evaluations $ SLP Speech Visit: 1 Visit SLP Evaluations $BSS Swallow: 1 Procedure $MBS Swallow: 1 Procedure $ SLP EVAL LANGUAGE/SOUND PRODUCTION: 1 Procedure SLP visit diagnosis: SLP Visit Diagnosis: Dysphagia, oropharyngeal phase (R13.12) Past Medical History: Past Medical History: Diagnosis Date  High cholesterol   05/2013; no defecits  Hyperlipemia   Left arm weakness   Left hand weakness   Personal history of colonic polyp - adenoma  10/23/2013  Stroke (HCC) 05/2013  MRI on 07/02/13 = Multifocal acute & subacute infarction involving right frontal MCA/ACA & left parietal MCA/PCA watershed areas Past Surgical History: Past Surgical History: Procedure Laterality Date  IR CT HEAD LTD  05/10/2022  IR PERCUTANEOUS ART THROMBECTOMY/INFUSION INTRACRANIAL INC DIAG ANGIO  05/10/2022  LOOP RECORDER IMPLANT N/A 10/31/2013  Procedure: LOOP RECORDER IMPLANT;  Surgeon: Lynwood JONETTA Rakers, MD;  Location: [facility] CATH LAB;  Service: Cardiovascular;  Laterality: N/A;   RADIOLOGY WITH ANESTHESIA N/A 05/10/2022  Procedure: IR WITH ANESTHESIA;  Surgeon: Dolphus Carrion, MD;  Location: [facility] OR;  Service: Radiology;  Laterality: N/A;  SHOULDER SURGERY Bilateral 2003, 1995 Vona Palma Laurice 02/25/2024, 12:30 PM  EEG adult Result Date: 02/25/2024 Matthews Elida HERO, MD     02/25/2024  7:28 AM Routine EEG Report LUGENE BEOUGHER is a 89 y.o. male with a history of altered mental status who is undergoing an EEG to evaluate for seizures. Report: This EEG was acquired with electrodes placed according to the International 10-20 electrode system (including Fp1, Fp2, F3, F4, C3, C4, P3, P4, O1, O2, T3, T4, T5, T6, A1, A2, Fz, Cz, Pz). The following electrodes were missing or displaced: none. The occipital dominant rhythm was 7 Hz. This activity is reactive to stimulation. Drowsiness was manifested by background fragmentation; deeper stages of sleep were not identified. There was no focal slowing. There were no interictal epileptiform discharges. There were no electrographic seizures identified. There was no abnormal response to photic stimulation or hyperventilation. Impression and clinical correlation: This EEG was obtained while awake and drowsy and is abnormal due to mild diffuse slowing indicative of global cerebral dysfunction. Epileptiform abnormalities were not seen during this recording. Elida Matthews, MD [facility] [phone] If 7pm- 7am, please page neurology on call as listed in AMION.   CT HEAD WO CONTRAST ( ) Result Date: 02/24/2024 EXAM: CT HEAD WITHOUT CONTRAST 02/24/2024 02:56:06 PM TECHNIQUE: CT of the head was performed without the administration of intravenous contrast. Automated  exposure control, iterative reconstruction, and/or weight based adjustment of the mA/kV was utilized to reduce the radiation dose to as low as reasonably achievable. COMPARISON: 02/23/2024 CLINICAL HISTORY: Neuro deficit, acute, stroke suspected FINDINGS: BRAIN AND VENTRICLES:  No acute hemorrhage is present. Evolving anterior right MCA territory infarct involving the right frontal operculum and anterior corona radiata is noted. Previously noted remote posterior right MCA territory infarct is stable. Remote infarct of the anterior left frontal lobe is stable. A remote infarct of the left parietal lobe is stable. Remote lacunar infarcts are noted in the cerebellum bilaterally. Atherosclerotic calcifications are present in the cavernous carotid arteries bilaterally and at the dural margin of both vertebral arteries. No hyperdense vessel is present. No hydrocephalus. No extra-axial collection. No mass effect or midline shift. ORBITS: Bilateral lens replacements are noted. The globes and orbits are otherwise within normal limits. SINUSES: No acute abnormality. SOFT TISSUES AND SKULL: No acute soft tissue abnormality. No skull fracture. IMPRESSION: 1. Evolving anterior right MCA territory infarct involving the right frontal operculum and anterior corona radiata. 2. No acute intracranial hemorrhage. 3. Stable remote posterior right MCA territory infarct. 4. Stable remote infarcts in the anterior left frontal lobe and left parietal lobe. 5. Remote lacunar infarcts in the cerebellum bilaterally. 6. Atherosclerotic calcifications in the cavernous carotid and vertebral arteries. No hyperdense vessel is present. Electronically signed by: Lonni Necessary MD 02/24/2024 03:27 PM EST RP Workstation: [id number]   ECHOCARDIOGRAM COMPLETE Result Date: 02/24/2024    ECHOCARDIOGRAM REPORT   Patient Name:   PARISH DUBOSE Date of Exam: 02/24/2024 Medical Rec #:  [id number]     Height:       72.0 in Accession #:    [id number]    Weight:       212.1 lb Date of Birth:  03/07/1934    BSA:          2.184 m Patient Age:    89 years      BP:           130/56 mmHg Patient Gender: M             HR:           69 bpm. Exam Location:  Inpatient Procedure: 2D Echo, Cardiac Doppler and Color Doppler (Both Spectral and  Color            Flow Doppler were utilized during procedure). Indications:    Stroke  History:        Patient has prior history of Echocardiogram examinations, most                 recent 12/23/2023. Arrythmias:Atrial Fibrillation; Risk                 Factors:Hypertension and Dyslipidemia.  Sonographer:    Sherlean Dubin Referring Phys: [id number] ANASTASSIA DOUTOVA  Sonographer Comments: Image acquisition challenging due to patient body habitus and Image acquisition challenging due to respiratory motion. IMPRESSIONS  1. No obvious source of embolus.  2. Left ventricular ejection fraction, by estimation, is 60 to 65%. The left ventricle has normal function. The left ventricle has no regional wall motion abnormalities. Left ventricular diastolic parameters are consistent with Grade I diastolic dysfunction (impaired relaxation).  3. Right ventricular systolic function is normal. The right ventricular size is normal.  4. Left atrial size was mildly dilated.  5. The mitral valve is abnormal. Trivial mitral valve regurgitation. No evidence of mitral stenosis.  6. Prior TTE done 12/23/23 had moderate  AS with mean gradient 24 mmHg. Tech had trouble with CW due to respiratory motion and body habitus and unable to reproduce these measurements. Prior moderate AS . The aortic valve was not well visualized. Aortic valve regurgitation is trivial. see comment below.  7. The inferior vena cava is normal in size with greater than 50% respiratory variability, suggesting right atrial pressure of 3 mmHg. FINDINGS  Left Ventricle: Left ventricular ejection fraction, by estimation, is 60 to 65%. The left ventricle has normal function. The left ventricle has no regional wall motion abnormalities. Strain was performed and the global longitudinal strain is indeterminate. The left ventricular internal cavity size was normal in size. There is no left ventricular hypertrophy. Left ventricular diastolic parameters are consistent with Grade I  diastolic dysfunction (impaired relaxation). Right Ventricle: The right ventricular size is normal. No increase in right ventricular wall thickness. Right ventricular systolic function is normal. Left Atrium: Left atrial size was mildly dilated. Right Atrium: Right atrial size was normal in size. Pericardium: There is no evidence of pericardial effusion. Mitral Valve: The mitral valve is abnormal. There is mild thickening of the mitral valve leaflet(s). There is mild calcification of the mitral valve leaflet(s). Mild mitral annular calcification. Trivial mitral valve regurgitation. No evidence of mitral valve stenosis. Tricuspid Valve: The tricuspid valve is normal in structure. Tricuspid valve regurgitation is not demonstrated. No evidence of tricuspid stenosis. Aortic Valve: Prior TTE done 12/23/23 had moderate AS with mean gradient 24 mmHg. Tech had trouble with CW due to respiratory motion and body habitus and unable to reproduce these measurements. Prior moderate AS. The aortic valve was not well visualized. Aortic valve regurgitation is trivial. See comment below. Pulmonic Valve: The pulmonic valve was normal in structure. Pulmonic valve regurgitation is not visualized. No evidence of pulmonic stenosis. Aorta: The aortic root is normal in size and structure. Venous: The inferior vena cava is normal in size with greater than 50% respiratory variability, suggesting right atrial pressure of 3 mmHg. IAS/Shunts: The interatrial septum was not well visualized. Additional Comments: No obvious source of embolus. 3D was performed not requiring image post processing on an independent workstation and was indeterminate.  LEFT VENTRICLE PLAX 2D LVIDd:         5.30 cm   Diastology LVIDs:         3.50 cm   LV e' medial:    3.81 cm/s LV PW:         1.00 cm   LV E/e' medial:  15.8 LV IVS:        1.10 cm   LV e' lateral:   7.40 cm/s LVOT diam:     2.00 cm   LV E/e' lateral: 8.1 LV SV:         56 LV SV Index:   26 LVOT Area:      3.14 cm  RIGHT VENTRICLE             IVC RV Basal diam:  2.70 cm     IVC diam: 1.20 cm RV Mid diam:    2.00 cm RV S prime:     18.20 cm/s  PULMONARY VEINS TAPSE (M-mode): 1.4 cm      Diastolic Velocity: 19.60 cm/s                             S/D Velocity:       1.30  Systolic Velocity:  25.90 cm/s LEFT ATRIUM           Index        RIGHT ATRIUM           Index LA diam:      4.20 cm 1.92 cm/m   RA Area:     14.70 cm LA Vol (A2C): 55.3 ml 25.32 ml/m  RA Volume:   31.40 ml  14.38 ml/m LA Vol (A4C): 42.3 ml 19.37 ml/m  AORTIC VALVE LVOT Vmax:   88.60 cm/s LVOT Vmean:  57.900 cm/s LVOT VTI:    0.178 m  AORTA Ao Root diam: 3.20 cm Ao Asc diam:  2.80 cm MITRAL VALVE MV Area (PHT): 6.96 cm    SHUNTS MV Decel Time: 109 msec    Systemic VTI:  0.18 m MV E velocity: 60.30 cm/s  Systemic Diam: 2.00 cm MV A velocity: 43.00 cm/s MV E/A ratio:  1.40 Maude Emmer MD Electronically signed by Maude Emmer MD Signature Date/Time: 02/24/2024/12:05:30 PM    Final    MR BRAIN WO CONTRAST Result Date: 02/24/2024 EXAM: MRI BRAIN WITHOUT CONTRAST 02/24/2024 09:31:29 AM TECHNIQUE: Multiplanar multisequence MRI of the head/brain was performed without the administration of intravenous contrast. COMPARISON: MRI of the head dated 12/30/2023. CLINICAL HISTORY: Mental status change, unknown cause. FINDINGS: BRAIN AND VENTRICLES: There is new restricted diffusion present within the right posterior frontal lobe, consistent with an acute infarct. There are chronic encephalomalacia changes again demonstrated at the right frontoparietal junction and right temporal lobe. There is advanced diffuse periventricular and deep cerebral white matter disease. There is Wallerian degeneration of the right cerebral peduncle. There are encephalomalacia changes within the left posterior frontal lobe related to small focal subcortical infarct. There is mild hemosiderin staining present within the right MCA distribution chronic  infarct. No intracranial hemorrhage. No mass. No midline shift. No hydrocephalus. The sella is unremarkable. Normal flow voids. ORBITS: No acute abnormality. SINUSES AND MASTOIDS: No acute abnormality. BONES AND SOFT TISSUES: Normal marrow signal. No acute soft tissue abnormality. IMPRESSION: 1. New restricted diffusion in the right posterior frontal lobe, consistent with acute non-hemorrhagic infarct. 2. Chronic encephalomalacia changes at the right frontoparietal junction and right temporal lobe. 3. Advanced diffuse periventricular and deep cerebral white matter disease. 4. Wallerian degeneration of the right cerebral peduncle. 5. Encephalomalacia changes within the left posterior frontal lobe related to small focal subcortical infarct. 6. Mild hemosiderin staining within the right MCA distribution chronic infarct. Electronically signed by: Evalene Coho MD 02/24/2024 09:43 AM EST RP Workstation: [id number]   DG Abd 1 View Result Date: 02/23/2024 EXAM: 1 VIEW XRAY OF THE ABDOMEN 02/23/2024 09:31:00 PM COMPARISON: None available. CLINICAL HISTORY: Constipation FINDINGS: LINES, TUBES AND DEVICES: Left chest loop recorder device in place. BOWEL: Nonobstructive bowel gas pattern. SOFT TISSUES: Extravasated intravenous contrast noted partially in the collecting systems and the urinary bladder. No abnormal calcifications. BONES: Multilevel degenerative changes of the spine. Mild degenerative changes of the bilateral hips. No acute fracture. CHEST FINDINGS: Chronic elevation of right hemidiaphragm. IMPRESSION: 1. No acute abdominal process. Electronically signed by: Morgane Naveau MD 02/23/2024 09:44 PM EST RP Workstation: [id number]   CT ANGIO HEAD NECK W WO CM Result Date: 02/23/2024 EXAM: CTA HEAD AND NECK WITH AND WITHOUT 02/23/2024 07:00:49 PM TECHNIQUE: CTA of the head and neck was performed with and without the administration of intravenous contrast. Multiplanar 2D and/or 3D reformatted images are  provided for review. Automated exposure control, iterative reconstruction, and/or weight based adjustment  of the mA/kV was utilized to reduce the radiation dose to as low as reasonably achievable. Stenosis of the internal carotid arteries measured using NASCET criteria. COMPARISON: CT Head today CLINICAL HISTORY: Stroke, follow up FINDINGS: AORTIC ARCH AND ARCH VESSELS: No dissection or arterial injury. No significant stenosis of the brachiocephalic or subclavian arteries. CERVICAL CAROTID ARTERIES: No dissection, arterial injury, or hemodynamically significant stenosis by NASCET criteria. CERVICAL VERTEBRAL ARTERIES: Patent. Similar severe proximal right vertebral artery stenosis. LUNGS AND MEDIASTINUM: Unremarkable. SOFT TISSUES: No acute abnormality. BONES: No acute abnormality. ANTERIOR CIRCULATION: No significant stenosis of the internal carotid arteries. No significant stenosis of the anterior cerebral arteries. Similar proximal right M2 MCA branch occlusion with distal reconstitution. No aneurysm. POSTERIOR CIRCULATION: Patent PCAs.  Moderate left P2 PCA stenosis. No significant stenosis of the basilar artery. No significant stenosis of the vertebral arteries. No aneurysm. OTHER: No dural venous sinus thrombosis on this non-dedicated study. IMPRESSION: 1. Similar proximal right M2 MCA branch occlusion with distal reconstitution. 2. Similar severe proximal right vertebral artery stenosis. 3. Similar moderate left P2 PCA stenosis. Electronically signed by: Gilmore Molt MD 02/23/2024 07:14 PM EST RP Workstation: [id number]   DG Chest Portable 1 View Result Date: 02/23/2024 EXAM: 1 VIEW(S) XRAY OF THE CHEST 02/23/2024 03:53:00 PM COMPARISON: 12/30/2023 CLINICAL HISTORY: AMS FINDINGS: LUNGS AND PLEURA: Elevated right hemidiaphragm is noted. No focal pulmonary opacity. No pleural effusion. No pneumothorax. HEART AND MEDIASTINUM: No acute abnormality of the cardiac and mediastinal silhouettes. BONES AND SOFT  TISSUES: No acute osseous abnormality. IMPRESSION: 1. No acute cardiopulmonary findings. Electronically signed by: Lynwood Seip MD 02/23/2024 04:13 PM EST RP Workstation: [id number]   CT Head Wo Contrast Result Date: 02/23/2024 EXAM: CT HEAD WITHOUT CONTRAST 02/23/2024 03:12:44 PM TECHNIQUE: CT of the head was performed without the administration of intravenous contrast. Automated exposure control, iterative reconstruction, and/or weight based adjustment of the mA/kV was utilized to reduce the radiation dose to as low as reasonably achievable. COMPARISON: CT head without contrast 12/30/2023. CLINICAL HISTORY: Delirium. FINDINGS: LIMITATIONS/ARTIFACTS: The study is moderately degraded by patient motion. BRAIN AND VENTRICLES: No acute hemorrhage. No evidence of acute infarct. Remote posterior right MCA territory infarct is again noted. Remote lacunar infarcts are present within the cerebellum bilaterally. Moderate white matter changes or similar to prior study. Atherosclerotic calcifications are present in the cavernous carotid arteries bilaterally and at the dural margin of both vertebral arteries. No hyperdense vessel is present. No hydrocephalus. No extra-axial collection. No mass effect or midline shift. ORBITS: No acute abnormality. SINUSES: No acute abnormality. SOFT TISSUES AND SKULL: No acute soft tissue abnormality. No skull fracture. IMPRESSION: 1. No acute intracranial abnormality. 2. Remote posterior right MCA territory infarct and remote bilateral cerebellar lacunar infarcts. 3. Moderate white matter changes, similar to prior study. 4. Atherosclerotic calcifications in the cavernous carotid arteries bilaterally and at the dural margin of both vertebral arteries. No hyperdense vessel is present. Electronically signed by: Lonni Necessary MD 02/23/2024 03:32 PM EST RP Workstation: [id number]     PHYSICAL EXAM  Temp:  [97.7 F (36.5 C)-99.5 F (37.5 C)] 97.8 F (36.6 C) (12/10 1110) Pulse  Rate:  [60-70] 68 (12/10 1110) Resp:  [16-20] 16 (12/10 1110) BP: (105-154)/(50-90) 106/90 (12/10 1110) SpO2:  [91 %-95 %] 95 % (12/10 1110)  General - Well nourished, well developed, in no apparent distress.  Cardiovascular - Regular rate.  Mental Status -  Level of arousal and orientation to time, place, and person were intact. Language including expression, naming,  repetition, comprehension was assessed and found intact. Attention span and concentration were normal.  Cranial Nerves II - XII - II - Visual fields testing suggest L Lower Quadrantanopia, R gaze preference III, IV, VI - Extraocular movements intact, but has difficulty crossing midline toward the L. V - Facial sensation significantly impaired on the left. VII - Facial movement significantly impaired on the left. VIII - Hearing & vestibular intact bilaterally. X - Palate elevates symmetrically. XI - Shoulder shrug weakened on the Left. XII - Tongue protrusion intact.  Motor Strength - The patients strength was 2/5 through L arm, 2/5 L hip flexion, 4/5 L dorsiflexion. All other muscle groups 5/5. Bulk was normal and fasciculations were absent.  Sensory - Light touch, was assessed and was diminished on the L throughout with L neglect.  Coordination - The patient had normal FTN in R hand, did not have strength to test L hand. Tremor was absent.  Gait and Station - deferred.   ASSESSMENT/PLAN [name] is a 89 y.o. male with history of A-fib on Eliquis  + ASA, HLD, HTN, and history of CVA w/ residual L sided weakness, CKD, mild dementia, depression, and anxiety who was admitted for stroke. No TNK given.   Recurrent stroke:  R frontal stroke, secondary to large vessel disease from chronic R M2 occlusion vs PAF even on Eliquis  + ASA  MRI: New R posterior frontal lobe, non-hemorrhagic infarct. 2D Echo: EF 60-65%, atrial normal size EEG no seizure LDL 50 HgbA1c 5.7 UDS negative Eliquis  for VTE  prophylaxis aspirin  81 mg daily and Eliquis  (apixaban ) daily prior to admission, now on Eliquis  (apixaban ) daily.  Ongoing aggressive stroke risk factor management Therapy recommendations:  HH vs. SNF with hospice care Disposition: TBD per family decision w/ palliative.   Hx of Stroke/TIA 3-06/2013 bilateral infarcts on MRI.  MRA, TTE, CUS all negative.  Discharged on aspirin  and Zocor .  30-day CardioNet monitoring negative for A-fib.  Loop recorder placed in 10/2013. 06/2014 found to have A-fib on loop recorder.  Started Xarelto  by cardiology 04/2022 admitted for right large MCA infarct with right M2 occlusion, status post IR with TICI2c.  However, post IR MRI still showing persistent M2 occlusion.  EF 60 to 65%, LDL 56, A1c 5.8.  Xarelto  and was switched to Eliquis , and continue Lipitor  20. Patient with recent hospitalization from 12/22/23-12/25/23 after presenting with difficulty speaking and right-sided facial droop found to have acute left precentral gyrus and acute lacunar infarct of the right cerebral hemisphere.  CTA head and neck right M2 more proximal occlusion with severe stenosis.  EF 60 to 65%.  LDL 78, A1c 5.6.  Pradaxa not covered by insurance, continue Eliquis  on discharge. 12/30/2023 admitted for new right frontal lobe small infarcts.  CTA head and neck similar proximal right M2 occlusion with distal reconstitution.  Similar severe right VA stenosis.  Similar left ICA 60 to 70% stenosis.  Continued Eliquis  and add aspirin  81 on top.  Continue statin. Pan CT 12/30/2023 showed no malignancy  PAF Home Meds: Eliquis  Discontinued telemetry monitoring Continue anticoagulation with Eliquis   On Amiodarone  and cardizem    Hypertension Stable On cardizem    Hyperlipidemia Home meds:  Atorvastatin  80mg  daily LDL 50, goal < 70 DC statins per comfort cares  Dysphagia Poststroke dysphagia Speech on board Diet: dysphagia 1 and nectar thick liquid Aspiration precautions  Other Stroke Risk  Factors Advanced age Hx stroke/TIA  Other Active Problems CKD 2, creatinine 1.19  Hospital day # 3  Pt  is now on comfort cares, Neurology will sign off at this time  D. Penne Mori, DO, PGY1 Neurology Stroke Team 02/27/2024 11:21 AM  ATTENDING NOTE: I reviewed above note and agree with the assessment and plan. Pt was seen and examined.   Wife and daughter are at the bedside. Pt wife is feeding him for puree diet. He seems to have good appetite. Still has L facial droop and left hemiplegia. Family decided on comfort care measures and would like to take him back to SNF with hospice care.   For detailed assessment and plan, please refer to above as I have made changes wherever appropriate.   Neurology will sign off. Please call with questions. Thanks for the consult.   Ary Cummins, MD PhD Stroke Neurology 02/27/2024 5:04 PM    To contact Stroke Continuity provider, please refer to [email or web address]. After hours, contact General Neurology

## 2024-02-28 DIAGNOSIS — Z515 Encounter for palliative care: Secondary | ICD-10-CM | POA: Diagnosis not present

## 2024-02-28 DIAGNOSIS — I639 Cerebral infarction, unspecified: Secondary | ICD-10-CM | POA: Diagnosis not present

## 2024-02-28 MED ORDER — MORPHINE SULFATE (CONCENTRATE) 10 MG /0.5 ML PO SOLN: 5.0000 mg | ORAL | 0 refills | Status: AC | PRN

## 2024-02-28 MED ORDER — GUAIFENESIN-DM 100-10 MG/5ML PO SYRP: 5.0000 mL | ORAL_SOLUTION | Freq: Four times a day (QID) | ORAL | 0 refills | Status: AC | PRN

## 2024-02-28 MED ORDER — LORAZEPAM 2 MG/ML PO CONC: 1.0000 mg | ORAL | 0 refills | Status: AC | PRN

## 2024-02-28 MED ORDER — HALOPERIDOL LACTATE 2 MG/ML PO CONC: 1.0000 mg | ORAL | 0 refills | Status: AC | PRN

## 2024-02-28 MED ORDER — SENNA 8.6 MG PO TABS: 1.0000 | ORAL_TABLET | Freq: Every evening | ORAL | 0 refills | Status: AC | PRN

## 2024-02-28 MED ORDER — FOOD THICKENER (SIMPLYTHICK): 10.0000 | Freq: Once | ORAL | Status: AC

## 2024-02-28 MED ORDER — BISACODYL 10 MG RE SUPP: 10.0000 mg | Freq: Every day | RECTAL | 0 refills | Status: AC | PRN

## 2024-02-28 NOTE — Evaluation (Signed)
 Clinical/Bedside Swallow Evaluation Patient Details  Name: Edward Maynard MRN: [id number] Date of Birth: [birth year]  Today's Date: 02/28/2024 Time: SLP Start Time (ACUTE ONLY): 1224 SLP Stop Time (ACUTE ONLY): 1237 SLP Time Calculation (min) (ACUTE ONLY): 13 min  Past Medical History:  Past Medical History:  Diagnosis Date   High cholesterol    05/2013; no defecits   Hyperlipemia    Left arm weakness    Left hand weakness    Personal history of colonic polyp - adenoma  10/23/2013   Stroke (HCC) 05/2013   MRI on 07/02/13 = Multifocal acute & subacute infarction involving right frontal MCA/ACA & left parietal MCA/PCA watershed areas   Past Surgical History:  Past Surgical History:  Procedure Laterality Date   IR CT HEAD LTD  05/10/2022   IR PERCUTANEOUS ART THROMBECTOMY/INFUSION INTRACRANIAL INC DIAG ANGIO  05/10/2022   LOOP RECORDER IMPLANT N/A 10/31/2013   Procedure: LOOP RECORDER IMPLANT;  Surgeon: Lynwood JONETTA Rakers, MD;  Location: [facility] CATH LAB;  Service: Cardiovascular;  Laterality: N/A;   RADIOLOGY WITH ANESTHESIA N/A 05/10/2022   Procedure: IR WITH ANESTHESIA;  Surgeon: Dolphus Carrion, MD;  Location: [facility] OR;  Service: Radiology;  Laterality: N/A;   SHOULDER SURGERY Bilateral 2003, 1995   HPI:  Edward Maynard is a 89 y.o. male who presented to the ED from [name] assisted [facility] with complaints of altered mental status, left-sided hemineglect.  MRI remarkable for restricted diffusion in the right posterior frontal lobe, consistent  with acute non-hemorrhagic infarct.  MBS 12/8 with recs for dysphagia 1/thin liquids, however in room after study, pt demonstrated overt s/s of aspiration with thins, so recommendations were changed to nectar liquids as a temporary measure to help protect lungs. Pt subsequently was transitioned to comfort care and SLP was discontinued. New orders today to re-evaluate swallowing pending D/C to [name] with hospice support.  [facility] significant for HTN, HLD,  A-fib on Eliquis , h/o CVAs with residual left-sided weakness, mild dementia, anxiety, depression.    Assessment / Plan / Recommendation  Clinical Impression  Pt's swallow was re-evaluated at the bedside. He was alert, participatory.  CN deficits on the left persist (V, VII, XII). Pt demonstrated much improved ability to draw liquids from a straw (unable to do so 12/8). He drank 4 oz of thin water, alternating with bites of applesauce - none of which led to any concerns for aspiration.  He continues to present with pocketing left cheek and slowed oral preparation.  Recommend D/Cing on a dysphagia 1 diet, but advance liquids to thin.  He may benefit from SLP f/u at facility to determine when solid foods can be advanced.  This would allow family to provide foods that he enjoys in a way that is reasonably safe and wouldn't be an obvious choking hazard.  Acute care SLP will sign off. Recommendations conveyed to medical team via Secure Chat.  SLP Visit Diagnosis: Dysphagia, oropharyngeal phase (R13.12)    Aspiration Risk  Mild aspiration risk    Diet Recommendation   Thin;Dysphagia 1 (puree)  Medication Administration: Whole meds with puree    Other Recommendations Oral Care Recommendations: Oral care QID  SLP f/u at facility to advance solids      Swallow Study   General Date of Onset: 02/24/24 HPI: Edward Maynard is a 89 y.o. male who presented to the ED from [name] with complaints of altered mental status, left-sided hemineglect.  MRI remarkable for restricted diffusion in the right  posterior frontal lobe, consistent  with acute non-hemorrhagic infarct.  MBS 12/8 with recs for dysphagia 1/thin liquids, however in room after study, pt demonstrated overt s/s of aspiration with thins, so recommendations were changed to nectar liquids as a temporary measure to help protect lungs. Pt subsequently was transitioned to comfort care and SLP was discontinued. New orders today to  re-evaluate swallowing pending D/C to [name] with hospice support.  [facility] significant for HTN, HLD, A-fib on Eliquis , h/o CVAs with residual left-sided weakness, mild dementia, anxiety, depression. Type of Study: Bedside Swallow Evaluation Previous Swallow Assessment: See HPI Diet Prior to this Study: Dysphagia 1 (pureed);Mildly thick liquids (Level 2, nectar thick) Temperature Spikes Noted: No Respiratory Status: Room air History of Recent Intubation: No Behavior/Cognition: Alert;Cooperative;Requires cueing Oral Cavity Assessment: Within Functional Limits Oral Care Completed by SLP: Recent completion by staff Oral Cavity - Dentition: Edentulous Vision: Functional for self-feeding Self-Feeding Abilities: Needs assist Patient Positioning: Upright in bed Baseline Vocal Quality: Wet Volitional Cough: Weak Volitional Swallow: Able to elicit    Oral/Motor/Sensory Function Overall Oral Motor/Sensory Function: Moderate impairment Facial ROM: Reduced left;Suspected CN VII (facial) dysfunction Facial Symmetry: Abnormal symmetry left;Suspected CN VII (facial) dysfunction Facial Strength: Reduced left;Suspected CN VII (facial) dysfunction Facial Sensation: Reduced left;Suspected CN V (Trigeminal) dysfunction Lingual ROM: Reduced left;Suspected CN XII (hypoglossal) dysfunction Lingual Symmetry: Abnormal symmetry right;Suspected CN XII (hypoglossal) dysfunction Lingual Strength: Reduced;Suspected CN XII (hypoglossal) dysfunction   Ice Chips Ice chips: Within functional limits   Thin Liquid Thin Liquid: Within functional limits    Nectar Thick Nectar Thick Liquid: Not tested   Honey Thick Honey Thick Liquid: Not tested   Puree Puree: Impaired Oral Phase Impairments: Reduced labial seal Oral Phase Functional Implications: Oral residue   Solid     Solid: Not tested      Vona Palma Laurice 02/28/2024,12:51 PM   Palma L. Vona, MA CCC/SLP Clinical Specialist - Acute Care  SLP Acute Rehabilitation Services Office number [phone]

## 2024-02-28 NOTE — Discharge Summary (Signed)
 Physician Discharge Summary  Edward Maynard FMW:[id number] DOB: [birth year] DOA: 02/23/2024  PCP: Sherre Rea, FNP  Admit date: 02/23/2024 Discharge date: 02/28/2024  Admitted from: ALF Discharge disposition: Back to ALF as comfort care  Recommendations at discharge:  Because of chronic dysphagia, he is currently on dysphagia 1 diet.  However given his complicated status, can be allowed elective feeding with the head of his and family's preference. Continue previous medicines if tolerated  Subjective: Patient was seen and examined this morning. Propped up in bed.  No acute distress.  Alert, awake, oriented to place and person. Daughter not at bedside today.  Currently on comfort care status since 12/9 Afebrile, heart rate 60s and 70s, blood pressure in a wide range from 100s to 150s,  Brief narrative: Edward Maynard is a 89 y.o. male with PMH significant for HTN, HLD, A-fib on Eliquis , h/o CVA with residual left-sided weakness, mild dementia, anxiety, depression. Patient lives at [facility] assisted living facility Has residual left-sided weakness from prior stroke but at baseline, patient is alert, oriented and able to feed himself. In February 2025, he had a new stroke and at the time he was switched from Xarelto  to Eliquis . Recently hospitalized twice in October, both times for new neurological symptoms. MRI brain done on 10/5 and 10/12 both showed new infarcts.  Ultimate recommendation from neurology was to continue Eliquis  and an additional aspirin  81 mg daily indefinitely.    12/6, patient presented to the ED with complaint of altered mental status, left-sided hemineglect.  His daughter noted him to more lethargic than baseline with the speech more slurred  In the ED, afebrile, hemodynamically stable, breathing on room air Seen by neurology Patient was felt not to be a good candidate for intervention and he was already on maximal therapy such as Eliquis  CT head nonacute  showed remote posterior right MCA CTA head similar to prior proximal right M2 MCA branch occlusion with distal reconstitution similar severe proximal right vertebral artery stenosis and moderate left P2 PCA stenosis Admitted to [facility]  MRI brain showed a new new restricted diffusion in the right posterior frontal lobe, consistent with acute non-hemorrhagic infarct. Neurology consulted.  Patient had to be medically optimized with aspirin  and Eliquis  already and hence no change was made.. Palliative care was consulted. 12/9, after discussion with family, patient was transitioned to comfort care status  Glucose: Comfort care status Comfort care orders initiated on 12/9 after palliative care discussion with family Social worker discussed with ALF.  They can take him back today as comfort care.  Acute nonhemorrhagic infarct of the right posterior frontal lobe Multiple prior strokes Patient had multiple prior strokes including 2 recent strokes 2 months ago  Chronically on Eliquis  and aspirin  Brought in for altered mentation, slurred speech, worsening weakness CT head and CTA head and neck did not show any new finding MRI brain showed an acute infarct as above. Stroke workup was ordered. Echo showed EF of 60 to 65%, no WMA, G1 DD,, no intracardiac source of embolism A1c 5.7 on 02/25/2024 HDL low at 36, LDL 50 Continue telemetry, neurochecks PT/OT/ST eval obtained.  Home health PT recommended PTA meds-medical optimized with Eliquis  and aspirin .  Continue the same as tolerated  Acute encephalopathy Other etiologies of AMS were considered as well but urinalysis unremarkable for infection, U tox negative.  Respiratory panel negative.  Ammonia level not elevated. Per H&P, by the time of admission, neurological exam was nonfocal but patient unable to cooperate fully  12/8, EEG did not show any active epileptiform activity Mental status seems stable. Current alert, awake, able to answer simple  questions.  Dysphagia  worsening cough 12/7, SLP eval obtained.   Currently on dysphagia 1 diet.  Recommend to continue same at discharge.  However since patient is in comfort care status, laxative feeding at patient's and family's preference is allowed.   A-fib with RVR  Chronic a-fib  12/7, patient had a short lasting episode of A-fib with RVR. Metoprolol  IV was planned but he converted back to normal sinus rhythm before it could be given PTA meds- amiodarone  100 mg daily, Cardizem  120 mg daily Currently continued on the same.   Chronically anticoagulated with Eliquis  5 mg p.o. twice daily   Essential hypertension No orthostatic blood pressure drop noted. Not on antihypertensives currently  HLD Continue Lipitor   Prolonged QT interval Admission EKG with QTc 570 ms.  Repeat EKG with improved QTc 440 ms    Mild dementia Anxiety/depression PTA meds- Prozac , Remeron , trazodone , Atarax  as needed, Continue as before  Goals of care   Code Status: Do not attempt resuscitation (DNR) - Comfort care   Diet:  Diet Order             Diet general           DIET - DYS 1 Room service appropriate? Yes with Assist; Fluid consistency: Nectar Thick  Diet effective now                   Nutritional status:  Body mass index is 28.76 kg/m.       Wounds:  - Wound 02/24/24 0800 Traumatic Hand Left;Posterior (Active)  Date First Assessed/Time First Assessed: 02/24/24 0800   Present on Original Admission: Yes  Primary Wound Type: Traumatic  Secondary Wound Type - Traumatic: Skin Tear  Location: Hand  Location Orientation: Left;Posterior    Assessments 02/24/2024  8:00 AM 02/27/2024  9:00 PM  Site / Wound Assessment Bleeding Dressing in place / Unable to assess  Peri-wound Assessment Intact --  Drainage Amount Moderate --  Treatments Cleansed --  Dressing Type Foam - Lift dressing to assess site every shift;Non adherent --  Dressing Changed Changed --  Dressing Status Clean,  Dry, Intact --     Active Orders  Date Order Priority Status Authorizing Provider  02/25/24 1353 Bismuth/Petroleum (Xeroform) gauze Routine Active Arlice Reichert, MD    Discharge Medications:   Allergies as of 02/28/2024   No Known Allergies      Medication List     TAKE these medications    acetaminophen  325 MG tablet Commonly known as: TYLENOL  Take 2 tablets (650 mg total) by mouth every 4 (four) hours as needed for mild pain (or temp > 37.5 C (99.5 F)).   amiodarone  200 MG tablet Commonly known as: PACERONE  Take 100 mg by mouth daily.   apixaban  5 MG Tabs tablet Commonly known as: ELIQUIS  Take 1 tablet (5 mg total) by mouth 2 (two) times daily.   AQUAPHOR OINTMENT BODY EX Apply 1 application  topically daily. To bilateral extremities   aspirin  EC 81 MG tablet Take 1 tablet (81 mg total) by mouth daily. Swallow whole.   atorvastatin  80 MG tablet Commonly known as: LIPITOR  Take 1 tablet (80 mg total) by mouth daily.   bisacodyl  10 MG suppository Commonly known as: DULCOLAX Place 1 suppository (10 mg total) rectally daily as needed for moderate constipation.   cholecalciferol 25 MCG (1000 UNIT) tablet Commonly known  as: VITAMIN D3 Take 1,000 Units by mouth daily.   diltiazem  120 MG 24 hr capsule Commonly known as: CARDIZEM  CD Take 1 capsule (120 mg total) by mouth daily.   docusate sodium  100 MG capsule Commonly known as: COLACE Take 1 capsule (100 mg total) by mouth daily as needed for mild constipation.   Fixodent Original Crea 1 application  by Does not apply route in the morning and at bedtime. Apply in the morning and before lunch   FLUoxetine  20 MG tablet Commonly known as: PROZAC  Take 20 mg by mouth daily.   folic acid  400 MCG tablet Commonly known as: FOLVITE  Take 400 mcg by mouth daily.   food thickener Gel Commonly known as: SIMPLYTHICK (NECTAR/LEVEL 2/MILDLY THICK) Take 10 packets by mouth once for 1 dose.    guaiFENesin -dextromethorphan  100-10 MG/5ML syrup Commonly known as: ROBITUSSIN DM Take 5 mLs by mouth every 6 (six) hours as needed for cough.   haloperidol  2 MG/ML solution Commonly known as: HALDOL  Place 0.5 mLs (1 mg total) under the tongue every 4 (four) hours as needed for agitation (or delirium).   hydrocortisone  1 % ointment Apply 1 Application topically 3 (three) times daily as needed for itching.   hydrOXYzine  25 MG tablet Commonly known as: ATARAX  Take 25 mg by mouth every 6 (six) hours as needed for itching or anxiety.   LORazepam  2 MG/ML concentrated solution Commonly known as: ATIVAN  Place 0.5 mLs (1 mg total) under the tongue every 4 (four) hours as needed for anxiety.   mirtazapine  7.5 MG tablet Commonly known as: REMERON  Take 7.5 mg by mouth at bedtime.   morphine  CONCENTRATE 10 mg / 0.5 ml concentrated solution Take 0.25 mLs (5 mg total) by mouth every 2 (two) hours as needed for moderate pain (pain score 4-6) (or dyspnea).   senna 8.6 MG Tabs tablet Commonly known as: SENOKOT Take 1 tablet (8.6 mg total) by mouth at bedtime as needed for mild constipation.   traZODone  100 MG tablet Commonly known as: DESYREL  Take 100 mg by mouth at bedtime.   triamcinolone  cream 0.1 % Commonly known as: KENALOG  Apply 1 Application topically 2 (two) times daily as needed (rash). Affected areas of leg               Discharge Care Instructions  (From admission, onward)           Start     Ordered   02/28/24 0000  Discharge wound care:        02/28/24 1044             Follow ups:    Follow-up Information     Cox, Gust, FNP Follow up.   Specialty: Family Medicine Contact information: [address] [place] [address] [phone]                 Discharge Instructions:   Discharge Instructions     Call MD for:  difficulty breathing, headache or visual disturbances   Complete by: As directed    Call MD for:  extreme fatigue    Complete by: As directed    Call MD for:  hives   Complete by: As directed    Call MD for:  persistant dizziness or light-headedness   Complete by: As directed    Call MD for:  persistant nausea and vomiting   Complete by: As directed    Call MD for:  severe uncontrolled pain   Complete by: As directed  Call MD for:  temperature >100.4   Complete by: As directed    Diet general   Complete by: As directed    Discharge instructions   Complete by: As directed    Recommendations at discharge:   Because of chronic dysphagia, he is currently on dysphagia 1 diet.  However given his complicated status, can be allowed elective feeding with the head of his and family's preference.  Continue previous medicines if tolerated  PDMP reviewed this encounter.   Opioid taper instructions: It is important to wean off of your opioid medication as soon as possible. If you do not need pain medication after your surgery it is ok to stop day one. Opioids include: Codeine, Hydrocodone(Norco, Vicodin), Oxycodone(Percocet, oxycontin) and hydromorphone amongst others.  Long term and even short term use of opiods can cause: Increased pain response Dependence Constipation Depression Respiratory depression And more.  Withdrawal symptoms can include Flu like symptoms Nausea, vomiting And more Techniques to manage these symptoms Hydrate well Eat regular healthy meals Stay active Use relaxation techniques(deep breathing, meditating, yoga) Do Not substitute Alcohol  to help with tapering If you have been on opioids for less than two weeks and do not have pain than it is ok to stop all together.  Plan to wean off of opioids This plan should start within one week post op of your joint replacement. Maintain the same interval or time between taking each dose and first decrease the dose.  Cut the total daily intake of opioids by one tablet each day Next start to increase the time between doses. The last  dose that should be eliminated is the evening dose.        General discharge instructions: Follow with Primary MD Sherre Rea, FNP in 7 days  Please request your PCP  to go over your hospital tests, procedures, radiology results at the follow up. Please get your medicines reviewed and adjusted.  Your PCP may decide to repeat certain labs or tests as needed. Do not drive, operate heavy machinery, perform activities at heights, swimming or participation in water activities or provide baby sitting services if your were admitted for syncope or siezures until you have seen by Primary MD or a Neurologist and advised to do so again. [place]  Controlled Substance Reporting System database was reviewed. Do not drive, operate heavy machinery, perform activities at heights, swim, participate in water activities or provide baby-sitting services while on medications for pain, sleep and mood until your outpatient physician has reevaluated you and advised to do so again.  You are strongly recommended to comply with the dose, frequency and duration of prescribed medications. Activity: As tolerated with Full fall precautions use walker/cane & assistance as needed Avoid using any recreational substances like cigarette, tobacco, alcohol , or non-prescribed drug. If you experience worsening of your admission symptoms, develop shortness of breath, life threatening emergency, suicidal or homicidal thoughts you must seek medical attention immediately by calling 911 or calling your MD immediately  if symptoms less severe. You must read complete instructions/literature along with all the possible adverse reactions/side effects for all the medicines you take and that have been prescribed to you. Take any new medicine only after you have completely understood and accepted all the possible adverse reactions/side effects.  Wear Seat belts while driving. You were cared for by a hospitalist during your hospital stay. If you  have any questions about your discharge medications or the care you received while you were in the hospital after you are  discharged, you can call the unit and ask to speak with the hospitalist or the covering physician. Once you are discharged, your primary care physician will handle any further medical issues. Please note that NO REFILLS for any discharge medications will be authorized once you are discharged, as it is imperative that you return to your primary care physician (or establish a relationship with a primary care physician if you do not have one).   Discharge wound care:   Complete by: As directed    Increase activity slowly   Complete by: As directed        Discharge Exam:   Vitals:   02/27/24 2043 02/27/24 2339 02/28/24 0358 02/28/24 0722  BP: (!) 132/58 (!) 106/51 (!) 122/56 (!) 118/51  Pulse: 63 61 (!) 59 60  Resp: 17 16 16 17   Temp: 98.1 F (36.7 C) 97.9 F (36.6 C) 97.8 F (36.6 C) 97.6 F (36.4 C)  TempSrc: Oral Oral Oral   SpO2: 94% 92% 95% 92%  Weight:      Height:        Body mass index is 28.76 kg/m.  General exam: Pleasant, elderly Caucasian male.  Not in distress.  Comfortable. Skin: No rashes, lesions or ulcers. HEENT: Atraumatic, normocephalic, no obvious bleeding Lungs: Clear to auscultation bilaterally,  CVS: S1, S2, no murmur,   GI/Abd: Soft, nontender, nondistended, bowel sound present,   CNS:  Alert, awake, oriented t to place and person. Psychiatry: Mood appropriate Extremities: No pedal edema, no calf tenderness,    The results of significant diagnostics from this hospitalization (including imaging, microbiology, ancillary and laboratory) are listed below for reference.    Procedures and Diagnostic Studies:   CT HEAD WO CONTRAST ( ) Result Date: 02/24/2024 EXAM: CT HEAD WITHOUT CONTRAST 02/24/2024 02:56:06 PM TECHNIQUE: CT of the head was performed without the administration of intravenous contrast. Automated exposure control,  iterative reconstruction, and/or weight based adjustment of the mA/kV was utilized to reduce the radiation dose to as low as reasonably achievable. COMPARISON: 02/23/2024 CLINICAL HISTORY: Neuro deficit, acute, stroke suspected FINDINGS: BRAIN AND VENTRICLES: No acute hemorrhage is present. Evolving anterior right MCA territory infarct involving the right frontal operculum and anterior corona radiata is noted. Previously noted remote posterior right MCA territory infarct is stable. Remote infarct of the anterior left frontal lobe is stable. A remote infarct of the left parietal lobe is stable. Remote lacunar infarcts are noted in the cerebellum bilaterally. Atherosclerotic calcifications are present in the cavernous carotid arteries bilaterally and at the dural margin of both vertebral arteries. No hyperdense vessel is present. No hydrocephalus. No extra-axial collection. No mass effect or midline shift. ORBITS: Bilateral lens replacements are noted. The globes and orbits are otherwise within normal limits. SINUSES: No acute abnormality. SOFT TISSUES AND SKULL: No acute soft tissue abnormality. No skull fracture. IMPRESSION: 1. Evolving anterior right MCA territory infarct involving the right frontal operculum and anterior corona radiata. 2. No acute intracranial hemorrhage. 3. Stable remote posterior right MCA territory infarct. 4. Stable remote infarcts in the anterior left frontal lobe and left parietal lobe. 5. Remote lacunar infarcts in the cerebellum bilaterally. 6. Atherosclerotic calcifications in the cavernous carotid and vertebral arteries. No hyperdense vessel is present. Electronically signed by: Lonni Necessary MD 02/24/2024 03:27 PM EST RP Workstation: [id number]   ECHOCARDIOGRAM COMPLETE Result Date: 02/24/2024    ECHOCARDIOGRAM REPORT   Patient Name:   Edward Maynard Date of Exam: 02/24/2024 Medical Rec #:  [id number]  Height:       72.0 in Accession #:    [id number]    Weight:       212.1 lb  Date of Birth:  03/15/1934    BSA:          2.184 m Patient Age:    89 years      BP:           130/56 mmHg Patient Gender: M             HR:           69 bpm. Exam Location:  Inpatient Procedure: 2D Echo, Cardiac Doppler and Color Doppler (Both Spectral and Color            Flow Doppler were utilized during procedure). Indications:    Stroke  History:        Patient has prior history of Echocardiogram examinations, most                 recent 12/23/2023. Arrythmias:Atrial Fibrillation; Risk                 Factors:Hypertension and Dyslipidemia.  Sonographer:    Sherlean Dubin Referring Phys: [id number] ANASTASSIA DOUTOVA  Sonographer Comments: Image acquisition challenging due to patient body habitus and Image acquisition challenging due to respiratory motion. IMPRESSIONS  1. No obvious source of embolus.  2. Left ventricular ejection fraction, by estimation, is 60 to 65%. The left ventricle has normal function. The left ventricle has no regional wall motion abnormalities. Left ventricular diastolic parameters are consistent with Grade I diastolic dysfunction (impaired relaxation).  3. Right ventricular systolic function is normal. The right ventricular size is normal.  4. Left atrial size was mildly dilated.  5. The mitral valve is abnormal. Trivial mitral valve regurgitation. No evidence of mitral stenosis.  6. Prior TTE done 12/23/23 had moderate AS with mean gradient 24 mmHg. Tech had trouble with CW due to respiratory motion and body habitus and unable to reproduce these measurements. Prior moderate AS . The aortic valve was not well visualized. Aortic valve regurgitation is trivial. see comment below.  7. The inferior vena cava is normal in size with greater than 50% respiratory variability, suggesting right atrial pressure of 3 mmHg. FINDINGS  Left Ventricle: Left ventricular ejection fraction, by estimation, is 60 to 65%. The left ventricle has normal function. The left ventricle has no regional wall motion  abnormalities. Strain was performed and the global longitudinal strain is indeterminate. The left ventricular internal cavity size was normal in size. There is no left ventricular hypertrophy. Left ventricular diastolic parameters are consistent with Grade I diastolic dysfunction (impaired relaxation). Right Ventricle: The right ventricular size is normal. No increase in right ventricular wall thickness. Right ventricular systolic function is normal. Left Atrium: Left atrial size was mildly dilated. Right Atrium: Right atrial size was normal in size. Pericardium: There is no evidence of pericardial effusion. Mitral Valve: The mitral valve is abnormal. There is mild thickening of the mitral valve leaflet(s). There is mild calcification of the mitral valve leaflet(s). Mild mitral annular calcification. Trivial mitral valve regurgitation. No evidence of mitral valve stenosis. Tricuspid Valve: The tricuspid valve is normal in structure. Tricuspid valve regurgitation is not demonstrated. No evidence of tricuspid stenosis. Aortic Valve: Prior TTE done 12/23/23 had moderate AS with mean gradient 24 mmHg. Tech had trouble with CW due to respiratory motion and body habitus and unable to reproduce these measurements. Prior moderate AS. The aortic valve was  not well visualized. Aortic valve regurgitation is trivial. See comment below. Pulmonic Valve: The pulmonic valve was normal in structure. Pulmonic valve regurgitation is not visualized. No evidence of pulmonic stenosis. Aorta: The aortic root is normal in size and structure. Venous: The inferior vena cava is normal in size with greater than 50% respiratory variability, suggesting right atrial pressure of 3 mmHg. IAS/Shunts: The interatrial septum was not well visualized. Additional Comments: No obvious source of embolus. 3D was performed not requiring image post processing on an independent workstation and was indeterminate.  LEFT VENTRICLE PLAX 2D LVIDd:         5.30 cm    Diastology LVIDs:         3.50 cm   LV e' medial:    3.81 cm/s LV PW:         1.00 cm   LV E/e' medial:  15.8 LV IVS:        1.10 cm   LV e' lateral:   7.40 cm/s LVOT diam:     2.00 cm   LV E/e' lateral: 8.1 LV SV:         56 LV SV Index:   26 LVOT Area:     3.14 cm  RIGHT VENTRICLE             IVC RV Basal diam:  2.70 cm     IVC diam: 1.20 cm RV Mid diam:    2.00 cm RV S prime:     18.20 cm/s  PULMONARY VEINS TAPSE (M-mode): 1.4 cm      Diastolic Velocity: 19.60 cm/s                             S/D Velocity:       1.30                             Systolic Velocity:  25.90 cm/s LEFT ATRIUM           Index        RIGHT ATRIUM           Index LA diam:      4.20 cm 1.92 cm/m   RA Area:     14.70 cm LA Vol (A2C): 55.3 ml 25.32 ml/m  RA Volume:   31.40 ml  14.38 ml/m LA Vol (A4C): 42.3 ml 19.37 ml/m  AORTIC VALVE LVOT Vmax:   88.60 cm/s LVOT Vmean:  57.900 cm/s LVOT VTI:    0.178 m  AORTA Ao Root diam: 3.20 cm Ao Asc diam:  2.80 cm MITRAL VALVE MV Area (PHT): 6.96 cm    SHUNTS MV Decel Time: 109 msec    Systemic VTI:  0.18 m MV E velocity: 60.30 cm/s  Systemic Diam: 2.00 cm MV A velocity: 43.00 cm/s MV E/A ratio:  1.40 Maude Emmer MD Electronically signed by Maude Emmer MD Signature Date/Time: 02/24/2024/12:05:30 PM    Final    MR BRAIN WO CONTRAST Result Date: 02/24/2024 EXAM: MRI BRAIN WITHOUT CONTRAST 02/24/2024 09:31:29 AM TECHNIQUE: Multiplanar multisequence MRI of the head/brain was performed without the administration of intravenous contrast. COMPARISON: MRI of the head dated 12/30/2023. CLINICAL HISTORY: Mental status change, unknown cause. FINDINGS: BRAIN AND VENTRICLES: There is new restricted diffusion present within the right posterior frontal lobe, consistent with an acute infarct. There are chronic encephalomalacia changes again demonstrated at the right frontoparietal junction and right  temporal lobe. There is advanced diffuse periventricular and deep cerebral white matter disease. There is  Wallerian degeneration of the right cerebral peduncle. There are encephalomalacia changes within the left posterior frontal lobe related to small focal subcortical infarct. There is mild hemosiderin staining present within the right MCA distribution chronic infarct. No intracranial hemorrhage. No mass. No midline shift. No hydrocephalus. The sella is unremarkable. Normal flow voids. ORBITS: No acute abnormality. SINUSES AND MASTOIDS: No acute abnormality. BONES AND SOFT TISSUES: Normal marrow signal. No acute soft tissue abnormality. IMPRESSION: 1. New restricted diffusion in the right posterior frontal lobe, consistent with acute non-hemorrhagic infarct. 2. Chronic encephalomalacia changes at the right frontoparietal junction and right temporal lobe. 3. Advanced diffuse periventricular and deep cerebral white matter disease. 4. Wallerian degeneration of the right cerebral peduncle. 5. Encephalomalacia changes within the left posterior frontal lobe related to small focal subcortical infarct. 6. Mild hemosiderin staining within the right MCA distribution chronic infarct. Electronically signed by: Evalene Coho MD 02/24/2024 09:43 AM EST RP Workstation: [id number]   DG Abd 1 View Result Date: 02/23/2024 EXAM: 1 VIEW XRAY OF THE ABDOMEN 02/23/2024 09:31:00 PM COMPARISON: None available. CLINICAL HISTORY: Constipation FINDINGS: LINES, TUBES AND DEVICES: Left chest loop recorder device in place. BOWEL: Nonobstructive bowel gas pattern. SOFT TISSUES: Extravasated intravenous contrast noted partially in the collecting systems and the urinary bladder. No abnormal calcifications. BONES: Multilevel degenerative changes of the spine. Mild degenerative changes of the bilateral hips. No acute fracture. CHEST FINDINGS: Chronic elevation of right hemidiaphragm. IMPRESSION: 1. No acute abdominal process. Electronically signed by: Morgane Naveau MD 02/23/2024 09:44 PM EST RP Workstation: [id number]   CT ANGIO HEAD NECK W WO  CM Result Date: 02/23/2024 EXAM: CTA HEAD AND NECK WITH AND WITHOUT 02/23/2024 07:00:49 PM TECHNIQUE: CTA of the head and neck was performed with and without the administration of intravenous contrast. Multiplanar 2D and/or 3D reformatted images are provided for review. Automated exposure control, iterative reconstruction, and/or weight based adjustment of the mA/kV was utilized to reduce the radiation dose to as low as reasonably achievable. Stenosis of the internal carotid arteries measured using NASCET criteria. COMPARISON: CT Head today CLINICAL HISTORY: Stroke, follow up FINDINGS: AORTIC ARCH AND ARCH VESSELS: No dissection or arterial injury. No significant stenosis of the brachiocephalic or subclavian arteries. CERVICAL CAROTID ARTERIES: No dissection, arterial injury, or hemodynamically significant stenosis by NASCET criteria. CERVICAL VERTEBRAL ARTERIES: Patent. Similar severe proximal right vertebral artery stenosis. LUNGS AND MEDIASTINUM: Unremarkable. SOFT TISSUES: No acute abnormality. BONES: No acute abnormality. ANTERIOR CIRCULATION: No significant stenosis of the internal carotid arteries. No significant stenosis of the anterior cerebral arteries. Similar proximal right M2 MCA branch occlusion with distal reconstitution. No aneurysm. POSTERIOR CIRCULATION: Patent PCAs.  Moderate left P2 PCA stenosis. No significant stenosis of the basilar artery. No significant stenosis of the vertebral arteries. No aneurysm. OTHER: No dural venous sinus thrombosis on this non-dedicated study. IMPRESSION: 1. Similar proximal right M2 MCA branch occlusion with distal reconstitution. 2. Similar severe proximal right vertebral artery stenosis. 3. Similar moderate left P2 PCA stenosis. Electronically signed by: Gilmore Molt MD 02/23/2024 07:14 PM EST RP Workstation: [id number]   DG Chest Portable 1 View Result Date: 02/23/2024 EXAM: 1 VIEW(S) XRAY OF THE CHEST 02/23/2024 03:53:00 PM COMPARISON: 12/30/2023 CLINICAL  HISTORY: AMS FINDINGS: LUNGS AND PLEURA: Elevated right hemidiaphragm is noted. No focal pulmonary opacity. No pleural effusion. No pneumothorax. HEART AND MEDIASTINUM: No acute abnormality of the cardiac and mediastinal silhouettes. BONES AND SOFT TISSUES: No  acute osseous abnormality. IMPRESSION: 1. No acute cardiopulmonary findings. Electronically signed by: Lynwood Seip MD 02/23/2024 04:13 PM EST RP Workstation: [id number]   CT Head Wo Contrast Result Date: 02/23/2024 EXAM: CT HEAD WITHOUT CONTRAST 02/23/2024 03:12:44 PM TECHNIQUE: CT of the head was performed without the administration of intravenous contrast. Automated exposure control, iterative reconstruction, and/or weight based adjustment of the mA/kV was utilized to reduce the radiation dose to as low as reasonably achievable. COMPARISON: CT head without contrast 12/30/2023. CLINICAL HISTORY: Delirium. FINDINGS: LIMITATIONS/ARTIFACTS: The study is moderately degraded by patient motion. BRAIN AND VENTRICLES: No acute hemorrhage. No evidence of acute infarct. Remote posterior right MCA territory infarct is again noted. Remote lacunar infarcts are present within the cerebellum bilaterally. Moderate white matter changes or similar to prior study. Atherosclerotic calcifications are present in the cavernous carotid arteries bilaterally and at the dural margin of both vertebral arteries. No hyperdense vessel is present. No hydrocephalus. No extra-axial collection. No mass effect or midline shift. ORBITS: No acute abnormality. SINUSES: No acute abnormality. SOFT TISSUES AND SKULL: No acute soft tissue abnormality. No skull fracture. IMPRESSION: 1. No acute intracranial abnormality. 2. Remote posterior right MCA territory infarct and remote bilateral cerebellar lacunar infarcts. 3. Moderate white matter changes, similar to prior study. 4. Atherosclerotic calcifications in the cavernous carotid arteries bilaterally and at the dural margin of both vertebral  arteries. No hyperdense vessel is present. Electronically signed by: Lonni Necessary MD 02/23/2024 03:32 PM EST RP Workstation: [id number]     Labs:   Basic Metabolic Panel: Recent Labs  Lab 02/23/24 1354 02/23/24 2150 02/24/24 0610  NA 140  --  139  K 3.8  --  3.9  CL 105  --  105  CO2 27  --  24  GLUCOSE 95  --  118*  BUN 12  --  11  CREATININE 1.21  --  1.19  CALCIUM  9.2  --  8.8*  MG  --  1.8  --   PHOS  --  3.2  --    GFR Estimated Creatinine Clearance: 52.6 mL/min (by C-G formula based on SCr of 1.19 mg/dL). Liver Function Tests: Recent Labs  Lab 02/23/24 1354 02/24/24 0610  AST 20 17  ALT 28 24  ALKPHOS 128* 115  BILITOT 0.7 1.0  PROT 7.0 6.2*  ALBUMIN 3.6 3.3*   No results for input(s): LIPASE, AMYLASE in the last 168 hours. Recent Labs  Lab 02/23/24 2150  AMMONIA 15   Coagulation profile No results for input(s): INR, PROTIME in the last 168 hours.  CBC: Recent Labs  Lab 02/23/24 1354 02/24/24 0610  WBC 8.7 7.4  HGB 14.5 13.7  HCT 43.5 40.4  MCV 93.3 92.2  PLT 162 149*   Cardiac Enzymes: Recent Labs  Lab 02/23/24 2150  CKTOTAL 57   BNP: Invalid input(s): POCBNP CBG: Recent Labs  Lab 02/23/24 1428  GLUCAP 99   D-Dimer No results for input(s): DDIMER in the last 72 hours. Hgb A1c No results for input(s): HGBA1C in the last 72 hours. Lipid Profile No results for input(s): CHOL, HDL, LDLCALC, TRIG, CHOLHDL, LDLDIRECT in the last 72 hours. Thyroid  function studies No results for input(s): TSH, T4TOTAL, T3FREE, THYROIDAB in the last 72 hours.  Invalid input(s): FREET3 Anemia work up No results for input(s): VITAMINB12, FOLATE, FERRITIN, TIBC, IRON, RETICCTPCT in the last 72 hours. Microbiology Recent Results (from the past 240 hours)  Resp panel by RT-PCR (RSV, Flu A&B, Covid) Anterior Nasal Swab  Status: None   Collection Time: 02/23/24  4:07 PM   Specimen: Anterior Nasal  Swab  Result Value Ref Range Status   SARS Coronavirus 2 by RT PCR NEGATIVE NEGATIVE Final   Influenza A by PCR NEGATIVE NEGATIVE Final   Influenza B by PCR NEGATIVE NEGATIVE Final    Comment: (NOTE) The Xpert Xpress SARS-CoV-2/FLU/RSV plus assay is intended as an aid in the diagnosis of influenza from Nasopharyngeal swab specimens and should not be used as a sole basis for treatment. Nasal washings and aspirates are unacceptable for Xpert Xpress SARS-CoV-2/FLU/RSV testing.  Fact Sheet for Patients: bloggercourse.com  Fact Sheet for Healthcare Providers: seriousbroker.it  This test is not yet approved or cleared by the [place]  FDA and has been authorized for detection and/or diagnosis of SARS-CoV-2 by FDA under an Emergency Use Authorization (EUA). This EUA will remain in effect (meaning this test can be used) for the duration of the COVID-19 declaration under Section 564(b)(1) of the Act, 21 U.S.C. section 360bbb-3(b)(1), unless the authorization is terminated or revoked.     Resp Syncytial Virus by PCR NEGATIVE NEGATIVE Final    Comment: (NOTE) Fact Sheet for Patients: bloggercourse.com  Fact Sheet for Healthcare Providers: seriousbroker.it  This test is not yet approved or cleared by the [place]  FDA and has been authorized for detection and/or diagnosis of SARS-CoV-2 by FDA under an Emergency Use Authorization (EUA). This EUA will remain in effect (meaning this test can be used) for the duration of the COVID-19 declaration under Section 564(b)(1) of the Act, 21 U.S.C. section 360bbb-3(b)(1), unless the authorization is terminated or revoked.  Performed at [facility], [address] [place], [place] [address]   Respiratory (~20 pathogens) panel by PCR     Status: None   Collection Time: 02/23/24 10:18 PM   Specimen: Nasopharyngeal Swab; Respiratory   Result Value Ref Range Status   Adenovirus NOT DETECTED NOT DETECTED Final   Coronavirus 229E NOT DETECTED NOT DETECTED Final    Comment: (NOTE) The Coronavirus on the Respiratory Panel, DOES NOT test for the novel  Coronavirus (2019 nCoV)    Coronavirus HKU1 NOT DETECTED NOT DETECTED Final   Coronavirus NL63 NOT DETECTED NOT DETECTED Final   Coronavirus OC43 NOT DETECTED NOT DETECTED Final   Metapneumovirus NOT DETECTED NOT DETECTED Final   Rhinovirus / Enterovirus NOT DETECTED NOT DETECTED Final   Influenza A NOT DETECTED NOT DETECTED Final   Influenza B NOT DETECTED NOT DETECTED Final   Parainfluenza Virus 1 NOT DETECTED NOT DETECTED Final   Parainfluenza Virus 2 NOT DETECTED NOT DETECTED Final   Parainfluenza Virus 3 NOT DETECTED NOT DETECTED Final   Parainfluenza Virus 4 NOT DETECTED NOT DETECTED Final   Respiratory Syncytial Virus NOT DETECTED NOT DETECTED Final   Bordetella pertussis NOT DETECTED NOT DETECTED Final   Bordetella Parapertussis NOT DETECTED NOT DETECTED Final   Chlamydophila pneumoniae NOT DETECTED NOT DETECTED Final   Mycoplasma pneumoniae NOT DETECTED NOT DETECTED Final    Comment: Performed at [facility], [address], [place], [place] [address]    Time coordinating discharge: 45 minutes  Signed: Toniyah Dilmore  [facility] 02/28/2024, 10:44 AM

## 2024-02-28 NOTE — Progress Notes (Signed)
 Josefa VEAR Rung to be D/C'd ALF per MD order.  Discussed with the patient and all questions fully answered.  VSS, Skin clean, dry and intact without evidence of skin break down, no evidence of skin tears noted. IV catheter discontinued intact. Site without signs and symptoms of complications. Dressing and pressure applied.  An After Visit Summary and signed prescriptions/DNR form given to PTAR for receiving facility.   Attempted to call report to [name] x3 with no response.   Patient instructed to return to ED, call 911, or call MD for any changes in condition.   Patient escorted via WC, and D/C home via non emergency ambulance.  Ileana LITTIE Gainer 02/28/2024 2:41 PM

## 2024-02-28 NOTE — NC FL2 (Signed)
 [place]  MEDICAID FL2 LEVEL OF CARE FORM     IDENTIFICATION  Patient Name: Edward Maynard Birthdate: 21-Apr-1933 Sex: male Admission Date (Current Location): 02/23/2024  [facility] and [place] Number:  Producer, Television/film/video and Address:  [facility] [place], [address] [place], [place] [address]      Provider Number: [id number]  Attending Physician Name and Address:  Arlice Reichert, MD  Relative Name and Phone Number:  Rogena Harvey Daughter   [phone]    Current Level of Care: Hospital Recommended Level of Care: Assisted Living Facility [facility]) Prior Approval Number:    Date Approved/Denied:   PASRR Number: [id number]  Discharge Plan: Other (Comment) ([name])    Current Diagnoses: Patient Active Problem List   Diagnosis Date Noted   Cough 02/24/2024   Prolonged QT interval 02/23/2024   History of stroke 02/23/2024   Acute CVA (cerebrovascular accident) (HCC) 12/31/2023   Acute encephalopathy 12/31/2023   Hypokalemia 12/30/2023   Prediabetes 12/30/2023   Lesion of parotid gland 12/30/2023   AAA (abdominal aortic aneurysm) 12/30/2023   Acute focal neurological deficit 12/22/2023   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   Pulmonary lesion, right 12/22/2023   Anxiety and depression 12/22/2023   Shoulder impingement syndrome, right 08/10/2022   CKD (chronic kidney disease) 05/18/2022   Insomnia 05/18/2022   CVA (cerebral vascular accident) (HCC) 05/18/2022   Essential hypertension 05/16/2022   Obesity 05/16/2022   Advanced age 89/27/2024   Dysphagia 05/16/2022   History of stroke with current residual effects 05/10/2022   Middle cerebral artery embolism, 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   Chronic a-fib (HCC) 05/10/2022   Paroxysmal atrial fibrillation (HCC) 01/18/2015   Chronic anticoagulation 01/18/2015   PVC's (premature ventricular contractions) 12/14/2014   NSVT (nonsustained ventricular tachycardia) (HCC) 12/14/2014    Cerebral infarction due to embolism of cerebral artery (HCC) 07/10/2014   Hyperlipidemia 12/12/2013   History of colonic polyps 10/23/2013    Orientation RESPIRATION BLADDER Height & Weight     Self, Time, Situation, Place  Normal Incontinent, External catheter Weight: 212 lb 1.3 oz (96.2 kg) Height:  6' 0.01 (182.9 cm)  BEHAVIORAL SYMPTOMS/MOOD NEUROLOGICAL BOWEL NUTRITION STATUS      Incontinent Diet (dysphagia 1 diet)  AMBULATORY STATUS COMMUNICATION OF NEEDS Skin   Total Care (non ambulatory) Verbally Skin abrasions                       Personal Care Assistance Level of Assistance  Bathing, Feeding, Dressing Bathing Assistance: Maximum assistance Feeding assistance: Limited assistance Dressing Assistance: Maximum assistance     Functional Limitations Info             SPECIAL CARE FACTORS FREQUENCY   (Hospice services)                    Contractures Contractures Info: Not present    Additional Factors Info                  Current Medications (02/28/2024):  This is the current hospital active medication list Current Facility-Administered Medications  Medication Dose Route Frequency Provider Last Rate Last Admin   acetaminophen  (TYLENOL ) tablet 650 mg  650 mg Oral Q4H PRN Doutova, Anastassia, MD       Or   acetaminophen  (TYLENOL ) 160 MG/5ML solution 650 mg  650 mg Per Tube Q4H PRN Doutova, Anastassia, MD       Or   acetaminophen  (TYLENOL ) suppository 650  mg  650 mg Rectal Q4H PRN Doutova, Anastassia, MD       amiodarone  (PACERONE ) tablet 100 mg  100 mg Oral Daily Dahal, Binaya, MD   100 mg at 02/28/24 0955   antiseptic oral rinse (BIOTENE) solution 15 mL  15 mL Topical PRN Ko, Joseph E, PA-C       apixaban  (ELIQUIS ) tablet 5 mg  5 mg Oral BID Dahal, Binaya, MD   5 mg at 02/28/24 0956   artificial tears ophthalmic solution 1 drop  1 drop Both Eyes QID PRN Ko, Joseph E, PA-C       bisacodyl  (DULCOLAX) suppository 10 mg  10 mg Rectal Daily PRN Ko,  Joseph E, PA-C       diltiazem  (CARDIZEM  CD) 24 hr capsule 120 mg  120 mg Oral Daily Dahal, Binaya, MD   120 mg at 02/28/24 9044   FLUoxetine  (PROZAC ) capsule 20 mg  20 mg Oral Daily Dahal, Chapman, MD   20 mg at 02/28/24 9043   food thickener (SIMPLYTHICK (NECTAR/LEVEL 2/MILDLY THICK)) 10 packet  10 packet Oral Once Dahal, Chapman, MD       glycopyrrolate  (ROBINUL ) tablet 1 mg  1 mg Oral Q4H PRN Ko, Joseph E, PA-C       Or   glycopyrrolate  (ROBINUL ) injection 0.2 mg  0.2 mg Subcutaneous Q4H PRN Ko, Joseph E, PA-C       Or   glycopyrrolate  (ROBINUL ) injection 0.2 mg  0.2 mg Intravenous Q4H PRN Ko, Joseph E, PA-C       guaiFENesin -dextromethorphan  (ROBITUSSIN DM) 100-10 MG/5ML syrup 5 mL  5 mL Oral Q6H PRN Dahal, Binaya, MD   5 mL at 02/26/24 0908   haloperidol  (HALDOL ) tablet 0.5 mg  0.5 mg Oral Q4H PRN Ko, Joseph E, PA-C       Or   haloperidol  (HALDOL ) 2 MG/ML solution 0.5 mg  0.5 mg Sublingual Q4H PRN Ko, Joseph E, PA-C       Or   haloperidol  lactate (HALDOL ) injection 0.5 mg  0.5 mg Intravenous Q4H PRN Ko, Joseph E, PA-C       LORazepam  (ATIVAN ) tablet 1 mg  1 mg Oral Q4H PRN Ko, Joseph E, PA-C       Or   LORazepam  (ATIVAN ) 2 MG/ML concentrated solution 1 mg  1 mg Sublingual Q4H PRN Ko, Joseph E, PA-C       Or   LORazepam  (ATIVAN ) injection 1 mg  1 mg Intravenous Q4H PRN Ko, Joseph E, PA-C       mirtazapine  (REMERON ) tablet 7.5 mg  7.5 mg Oral QHS Dahal, Chapman, MD   7.5 mg at 02/27/24 2246   morphine  CONCENTRATE 10 mg / 0.5 ml oral solution 5 mg  5 mg Oral Q2H PRN Ko, Joseph E, PA-C       Or   morphine  CONCENTRATE 10 mg / 0.5 ml oral solution 5 mg  5 mg Sublingual Q2H PRN Ko, Joseph E, PA-C       ondansetron  (ZOFRAN -ODT) disintegrating tablet 4 mg  4 mg Oral Q6H PRN Ko, Joseph E, PA-C       Or   ondansetron  (ZOFRAN ) injection 4 mg  4 mg Intravenous Q6H PRN Ko, Joseph E, PA-C       senna (SENOKOT) tablet 8.6 mg  1 tablet Oral QHS PRN Ko, Joseph E, PA-C       traZODone  (DESYREL ) tablet  100 mg  100 mg Oral QHS Dahal, Chapman, MD   100 mg at  02/27/24 2246     Discharge Medications: Please see discharge summary for a list of discharge medications.  Relevant Imaging Results:  Relevant Lab Results:   Additional Information SS#: [id number]  Bridget Cordella Simmonds, LCSW

## 2024-02-28 NOTE — Progress Notes (Signed)
 Daily Progress Note   Date: 02/28/2024   Patient Name: Edward Maynard  DOB: 1934/01/03  MRN: [id number]  Age / Sex: 89 y.o., male  Attending Physician: Arlice Reichert, MD Primary Care Physician: Sherre Rea, FNP Admit Date: 02/23/2024 Length of Stay: 4 days  Reason for Follow-up: Establishing goals of care  Past Medical History:  Diagnosis Date   High cholesterol    05/2013; no defecits   Hyperlipemia    Left arm weakness    Left hand weakness    Personal history of colonic polyp - adenoma  10/23/2013   Stroke (HCC) 05/2013   MRI on 07/02/13 = Multifocal acute & subacute infarction involving right frontal MCA/ACA & left parietal MCA/PCA watershed areas    Assessment & Plan:   HPI/Patient Profile:   89 y.o. male  with past medical history of CVA with left sided deficits, dementia, A-fib on anticoagulation admitted on 02/23/2024 with acute infarct and acute encephalopathy.    Per Emergency Medicine note by Ula MD on 02/23/2024 patient presented with altered mental status and new left side neglect with known left side deficit at baseline.    Per progress note on 02/25/2024 by Dahal MD, MRI brain on 02/24/2024 demonstrated new restricted diffusion in the right posterior frontal lobe consistent with acute non-hemorrhagic infarct and chronic right MCA infarct. Multiple strokes in 04/2023, 12/2023, and 02/2024 with continued overall deterioration.    Palliative medicine consulted for goals of care conversation.   SUMMARY OF RECOMMENDATIONS  DNR-comfort Discussed with daughter and son who were agreeable for patient to return to [name] with hospice  Symptom Management:  Morphine  pain/dyspnea/increased work of breathing/RR>25 Tylenol  PRN pain/fever Biotin PRN daily Robinul  PRN secretions Haldol  PRN agitation/delirium Ativan  PRN anxiety/seizure/sleep/distress Zofran  PRN nausea/vomiting Liquifilm Tears PRN dry eyes  Code Status: DNR - Comfort  Prognosis: < 6 months  Discharge  Planning: ALF with Hospice  Discussed with: Dahal MD about plan to discharge back to [name] with hospice support.   Subjective:   Subjective: Chart Reviewed. Updates received. Patient Assessed. Created space and opportunity for patient  and family to explore thoughts and feelings regarding current medical situation.  Patient did not receive any PRN medications for symptom management. Continues to eat about 50% of meals per charting.   Today's Discussion:  Met with patient today without any visitors bedside. Patient denied any discomfort with breakfast tray bedside awaiting feeding assistance.   Spoke on phone with daughter [name]) about their plan for hospice. They shared that [name] will be able to take him back with hospice support. They do not have any concerns at this time or any further questions.   Review of Systems  All other systems reviewed and are negative.   Objective:   Primary Diagnoses: Present on Admission:  Prolonged QT interval  Acute encephalopathy  (Resolved) Acute encephalopathy  Chronic a-fib (HCC)  Essential hypertension  Paroxysmal atrial fibrillation (HCC)  Hyperlipidemia  CKD (chronic kidney disease)  Cough  Acute CVA (cerebrovascular accident) (HCC)   Vital Signs:  BP (!) 118/51 (BP Location: Right Arm)   Pulse 60   Temp 97.6 F (36.4 C)   Resp 17   Ht 6' 0.01 (1.829 m)   Wt 96.2 kg   SpO2 92%   BMI 28.76 kg/m   Physical Exam Constitutional:      Appearance: He is ill-appearing.  HENT:     Head: Normocephalic.     Nose: Nose normal.     Mouth/Throat:  Mouth: Mucous membranes are moist.     Comments: Right side depressed consistent with prior CVA.  Eyes:     Extraocular Movements: Extraocular movements intact.  Cardiovascular:     Pulses: Normal pulses.  Pulmonary:     Effort: Pulmonary effort is normal.  Abdominal:     Palpations: Abdomen is soft.  Skin:    General: Skin is warm and dry.  Neurological:      Mental Status: He is alert. Mental status is at baseline.  Psychiatric:        Mood and Affect: Mood normal.    Palliative Assessment/Data: 30%   Existing Vynca/ACP Documentation: AD and HCPOA pending scanning into EMR Does not want his life to be prolonged in the event of a terminal illness.  HCPOA are wife, son, and daughter  Thank you for allowing us  to participate in the care of CASY BRUNETTO PMT will continue to support holistically.  I personally spent a total of 35 minutes in the care of the patient today including preparing to see the patient, performing a medically appropriate exam/evaluation, counseling and educating, referring and communicating with other health care professionals, and documenting clinical information in the EHR.  Fairy FORBES Shan DEVONNA  Palliative Medicine Team  Team Phone # [phone] (Nights/Weekends) 02/28/2024 10:05 AM

## 2024-02-28 NOTE — TOC Transition Note (Signed)
 Transition of Care [facility]) - Discharge Note   Patient Details  Name: Edward Maynard MRN: [id number] Date of Birth: 1933/05/07  Transition of Care [facility]) CM/SW Contact:  Bridget Cordella Simmonds, LCSW Phone Number: 02/28/2024, 2:09 PM   Clinical Narrative:   Pt discharging to [name]. RN call report to [phone].   1355: PTAR called.   Final next level of care: Assisted Living Barriers to Discharge: Barriers Resolved   Patient Goals and CMS Choice Patient states their goals for this hospitalization and ongoing recovery are:: patient unable to participate in goal setting, not fully oriented CMS Medicare.gov Compare Post Acute Care list provided to:: Patient Represenative (must comment) Choice offered to / list presented to : Adult Children      Discharge Placement              Patient chooses bed at:  ([name]) Patient to be transferred to facility by: ptar Name of family member notified: daughter [name] Patient and family notified of of transfer: 02/28/24  Discharge Plan and Services Additional resources added to the After Visit Summary for       Post Acute Care Choice: NA                               Social Drivers of Health (SDOH) Interventions SDOH Screenings   Food Insecurity: No Food Insecurity (02/24/2024)  Housing: Low Risk (02/24/2024)  Transportation Needs: No Transportation Needs (02/24/2024)  Utilities: Not At Risk (02/24/2024)  Depression (PHQ2-9): Low Risk (11/10/2022)  Social Connections: Patient Declined (02/24/2024)  Tobacco Use: Medium Risk (02/23/2024)     Readmission Risk Interventions     No data to display

## 2024-02-28 NOTE — TOC Progression Note (Signed)
 Transition of Care [facility]) - Progression Note    Patient Details  Name: Edward Maynard MRN: [id number] Date of Birth: Jul 27, 1933  Transition of Care [facility]) CM/SW Contact  Bridget Cordella Simmonds, LCSW Phone Number: 02/28/2024, 11:42 AM  Clinical Narrative:   Janas and DC summary faxed to [name].  [id number]: CSW spoke with daughter [name] regarding DC today, she confirmed they do want pt to return.  Hospice choice discussed: [name] does want to choose [facility].  She had question about speech therapy session today, message sent to MD.     Expected Discharge Plan: Assisted Living Barriers to Discharge: Continued Medical Work up               Expected Discharge Plan and Services     Post Acute Care Choice: NA Living arrangements for the past 2 months: Assisted Living Facility Expected Discharge Date: 02/28/24                                     Social Drivers of Health (SDOH) Interventions SDOH Screenings   Food Insecurity: No Food Insecurity (02/24/2024)  Housing: Low Risk (02/24/2024)  Transportation Needs: No Transportation Needs (02/24/2024)  Utilities: Not At Risk (02/24/2024)  Depression (PHQ2-9): Low Risk (11/10/2022)  Social Connections: Patient Declined (02/24/2024)  Tobacco Use: Medium Risk (02/23/2024)    Readmission Risk Interventions     No data to display

## 2024-03-03 ENCOUNTER — Telehealth: Payer: Self-pay | Admitting: Neurology

## 2024-03-03 NOTE — Telephone Encounter (Signed)
 Daughter reports pt is in Hospice

## 2024-03-07 ENCOUNTER — Telehealth: Payer: Self-pay

## 2024-03-07 DIAGNOSIS — I639 Cerebral infarction, unspecified: Secondary | ICD-10-CM

## 2024-03-07 NOTE — Transitions of Care (Post Inpatient/ED Visit) (Signed)
 Stroke Discharge Follow-up   03/07/2024 Name:  Edward Maynard MRN:  [id number] DOB:  01-Apr-1933  Subjective: Edward Maynard is a 89 y.o. year old male who is a primary care patient of Cox, Gust, FNP An Emmi alert was received indicating patient responded to questions: Deceased?. I reached out by phone to follow up on the alert and spoke to no call made due to patient being deceased.  When discharged from hospital patient was discharged with hospice..  Care Management Interventions: none  Follow up plan: No further intervention required.  Notified Leita Lyme to stop emmi calls.  Alan Ee, RN, BSN, CEN [employer]- Transition of Care Team.  [facility] [phone]

## 2024-03-25 ENCOUNTER — Ambulatory Visit: Admitting: Neurology
